# Patient Record
Sex: Female | Born: 1945 | Race: White | Hispanic: No | Smoking: Former smoker
Health system: Southern US, Community
[De-identification: ages and names within clinical notes are randomized; demographics above are authoritative.]

## PROBLEM LIST (undated history)

## (undated) DIAGNOSIS — Z9981 Dependence on supplemental oxygen: Secondary | ICD-10-CM

## (undated) DIAGNOSIS — S82092A Other fracture of left patella, initial encounter for closed fracture: Secondary | ICD-10-CM

## (undated) DIAGNOSIS — J189 Pneumonia, unspecified organism: Secondary | ICD-10-CM

## (undated) DIAGNOSIS — I2510 Atherosclerotic heart disease of native coronary artery without angina pectoris: Secondary | ICD-10-CM

## (undated) DIAGNOSIS — I272 Pulmonary hypertension, unspecified: Secondary | ICD-10-CM

## (undated) DIAGNOSIS — Z972 Presence of dental prosthetic device (complete) (partial): Secondary | ICD-10-CM

## (undated) DIAGNOSIS — K4090 Unilateral inguinal hernia, without obstruction or gangrene, not specified as recurrent: Secondary | ICD-10-CM

## (undated) DIAGNOSIS — S82091A Other fracture of right patella, initial encounter for closed fracture: Secondary | ICD-10-CM

## (undated) DIAGNOSIS — T148XXA Other injury of unspecified body region, initial encounter: Secondary | ICD-10-CM

## (undated) DIAGNOSIS — R233 Spontaneous ecchymoses: Secondary | ICD-10-CM

## (undated) DIAGNOSIS — R238 Other skin changes: Secondary | ICD-10-CM

## (undated) DIAGNOSIS — N390 Urinary tract infection, site not specified: Secondary | ICD-10-CM

## (undated) DIAGNOSIS — IMO0001 Reserved for inherently not codable concepts without codable children: Secondary | ICD-10-CM

## (undated) DIAGNOSIS — I10 Essential (primary) hypertension: Secondary | ICD-10-CM

## (undated) DIAGNOSIS — D759 Disease of blood and blood-forming organs, unspecified: Secondary | ICD-10-CM

## (undated) DIAGNOSIS — R234 Changes in skin texture: Secondary | ICD-10-CM

## (undated) DIAGNOSIS — J449 Chronic obstructive pulmonary disease, unspecified: Secondary | ICD-10-CM

## (undated) HISTORY — DX: Chronic obstructive pulmonary disease, unspecified: J44.9

## (undated) HISTORY — PX: MULTIPLE TOOTH EXTRACTIONS: SHX2053

## (undated) HISTORY — PX: COLONOSCOPY: SHX174

---

## 2003-06-08 ENCOUNTER — Emergency Department (HOSPITAL_COMMUNITY): Admission: EM | Admit: 2003-06-08 | Discharge: 2003-06-08 | Payer: Self-pay | Admitting: Emergency Medicine

## 2004-05-31 ENCOUNTER — Emergency Department (HOSPITAL_COMMUNITY): Admission: EM | Admit: 2004-05-31 | Discharge: 2004-05-31 | Payer: Self-pay | Admitting: Emergency Medicine

## 2011-02-21 ENCOUNTER — Ambulatory Visit: Payer: Self-pay | Admitting: Gastroenterology

## 2011-11-17 ENCOUNTER — Encounter: Payer: Self-pay | Admitting: Internal Medicine

## 2011-11-18 ENCOUNTER — Ambulatory Visit (INDEPENDENT_AMBULATORY_CARE_PROVIDER_SITE_OTHER): Payer: Medicare Other | Admitting: Internal Medicine

## 2011-11-18 ENCOUNTER — Encounter: Payer: Self-pay | Admitting: Internal Medicine

## 2011-11-18 ENCOUNTER — Ambulatory Visit (INDEPENDENT_AMBULATORY_CARE_PROVIDER_SITE_OTHER)
Admission: RE | Admit: 2011-11-18 | Discharge: 2011-11-18 | Disposition: A | Discharge status: Home / Self Care | Payer: Medicare Other | Source: Ambulatory Visit | Attending: Internal Medicine | Admitting: Internal Medicine

## 2011-11-18 VITALS — BP 102/68 | HR 109 | Temp 98.2°F | Ht 62.5 in | Wt 102.6 lb

## 2011-11-18 DIAGNOSIS — R0609 Other forms of dyspnea: Secondary | ICD-10-CM

## 2011-11-18 DIAGNOSIS — J4489 Other specified chronic obstructive pulmonary disease: Secondary | ICD-10-CM

## 2011-11-18 DIAGNOSIS — R0600 Dyspnea, unspecified: Secondary | ICD-10-CM

## 2011-11-18 DIAGNOSIS — R059 Cough, unspecified: Secondary | ICD-10-CM

## 2011-11-18 DIAGNOSIS — R05 Cough: Secondary | ICD-10-CM

## 2011-11-18 DIAGNOSIS — R0989 Other specified symptoms and signs involving the circulatory and respiratory systems: Secondary | ICD-10-CM

## 2011-11-18 DIAGNOSIS — F17200 Nicotine dependence, unspecified, uncomplicated: Secondary | ICD-10-CM

## 2011-11-18 DIAGNOSIS — J449 Chronic obstructive pulmonary disease, unspecified: Secondary | ICD-10-CM

## 2011-11-18 NOTE — Progress Notes (Signed)
Subjective:    Patient ID: [name], female    DOB: 14-Jul-1945, 66 y.o.   MRN: [id number]  HPI  IOV 11/18/2011  66 year old female Body mass index is 18.47 kg/(m^2).  reports that she has been smoking Cigarettes.  She has a 53 pack-year smoking history. She does not have any smokeless tobacco history on file. Woody Seller, MD, MD is PCP and Dr Wilma Flavin at [facility]. She is a Museum/gallery curator at [employer]   Main issue referred for dyspnea. Insidious onset few to several years ago. Progressive past few to several months. Currently dyspneic for exertion like climbing 1 flight of steps, pick up heavy objects but still able to do laundry or do basic gardening without significant difficulty. Dyspnea improved by rest. SEverity is rated as moderate to severe.  There is associatd wheeze but no chest pain.  She is on spiriva for 12 years and advair for several years but gives only short term relief; she reports good compliance.   There is also  Chronic cough for several years early in the morning and later in the day. There is some  Clear mucus with cough.  There is associated lisinopril intake for sevderal years. Rates cough as moderate with worst episode in mornings.   Smoker - STarted age 64. 33 pack per day. Quit end 2012 for first time with help of wellbutrin and patch.  But recently 66 year old granddaughter from congential liver disease ("cirrhosis") moved back with her and since then smoking. Patient is primary health care provider because her daughter and 98 year old do not get along.  Smokes 1 ppd (used to smoke 2 ppid)    CAT COPD Symptom and Quality of Life Score (glaxo smith kline trademark)  0 (no burden) to 5 (highest burden)  Never Cough -> Cough all the time 3  No phlegm in chest -> Chest is full of phlegm 3  No chest tightness -> Chest feels very tight 2  No dyspnea for 1 flight stairs/hill -> Very dyspneic for 1 flight of stairs 5  No limitations for ADL at home -> Very  limited with ADL at home 3  Confident leaving home -> Not at all confident leaving home 0  Sleep soundly -> Do not sleep soundly because of lung condition 3  Lots of Energy -> No energy at all 3  TOTAL Score (max 40)  22   Labs  - spirometry, cxr, walk test - all not done in several years or not done at all.      No results found.   Past Medical History  Diagnosis Date  . COPD (chronic obstructive pulmonary disease)   . Hypoxemia   . Collapsed lung      Family History  Problem Relation Age of Onset  . Heart disease Father   . Kidney failure Father      History   Social History  . Marital Status: Widowed    Spouse Name: N/A    Number of Children: N/A  . Years of Education: N/A   Occupational History  . weaver    Social History Main Topics  . Smoking status: Current Everyday Smoker -- 1.0 packs/day for 53 years    Types: Cigarettes  . Smokeless tobacco: Not on file  . Alcohol Use: Yes  . Drug Use: No  . Sexually Active: Not on file   Other Topics Concern  . Not on file   Social History Narrative  .  No narrative on file     No Known Allergies   Outpatient Prescriptions Prior to Visit  Medication Sig Dispense Refill  . amLODipine (NORVASC) 5 MG tablet Take 2.5 mg by mouth daily.       . Fluticasone-Salmeterol (ADVAIR) 250-50 MCG/DOSE AEPB Inhale 1 puff into the lungs every 12 (twelve) hours.      Marland Kitchen glucosamine-chondroitin 500-400 MG tablet Take 1 tablet by mouth 2 (two) times daily.      Marland Kitchen ipratropium-albuterol (DUONEB) 0.5-2.5 (3) MG/3ML SOLN Take 3 mLs by nebulization every 6 (six) hours as needed.      Marland Kitchen lisinopril (PRINIVIL,ZESTRIL) 5 MG tablet Take 5 mg by mouth daily.      Marland Kitchen tiotropium (SPIRIVA) 18 MCG inhalation capsule Place 18 mcg into inhaler and inhale daily.      Marland Kitchen triamterene-hydrochlorothiazide (DYAZIDE) 37.5-25 MG per capsule Take 0.5 capsules by mouth daily.       . vitamin C (ASCORBIC ACID) 500 MG tablet Take 500 mg by mouth daily.      .  vitamin E 400 UNIT capsule Take 400 Units by mouth daily.           Review of Systems  Constitutional: Negative for fever and unexpected weight change.  HENT: Negative for ear pain, nosebleeds, congestion, sore throat, rhinorrhea, sneezing, trouble swallowing, dental problem, postnasal drip and sinus pressure.   Eyes: Negative for redness and itching.  Respiratory: Positive for cough and shortness of breath. Negative for chest tightness and wheezing.   Cardiovascular: Negative for palpitations and leg swelling.  Gastrointestinal: Negative for nausea and vomiting.  Genitourinary: Negative for dysuria.  Musculoskeletal: Negative for joint swelling.  Skin: Negative for rash.  Neurological: Negative for headaches.  Hematological: Does not bruise/bleed easily.  Psychiatric/Behavioral: Negative for dysphoric mood. The patient is not nervous/anxious.        Objective:   Physical Exam  Vitals reviewed. Constitutional: She is oriented to person, place, and time. She appears well-developed and well-nourished. No distress.       Body mass index is 18.47 kg/(m^2).   HENT:  Head: Normocephalic and atraumatic.  Right Ear: External ear normal.  Left Ear: External ear normal.  Mouth/Throat: Oropharynx is clear and moist. No oropharyngeal exudate.  Eyes: Conjunctivae and EOM are normal. Pupils are equal, round, and reactive to light. Right eye exhibits no discharge. Left eye exhibits no discharge. No scleral icterus.  Neck: Normal range of motion. Neck supple. No JVD present. No tracheal deviation present. No thyromegaly present.  Cardiovascular: Normal rate, regular rhythm, normal heart sounds and intact distal pulses.  Exam reveals no gallop and no friction rub.   No murmur heard. Pulmonary/Chest: Effort normal and breath sounds normal. No respiratory distress. She has no wheezes. She has no rales. She exhibits no tenderness.  Abdominal: Soft. Bowel sounds are normal. She exhibits no  distension and no mass. There is no tenderness. There is no rebound and no guarding.  Musculoskeletal: Normal range of motion. She exhibits no edema and no tenderness.  Lymphadenopathy:    She has no cervical adenopathy.  Neurological: She is alert and oriented to person, place, and time. She has normal reflexes. No cranial nerve deficit. She exhibits normal muscle tone. Coordination normal.  Skin: Skin is warm and dry. No rash noted. She is not diaphoretic. No erythema. No pallor.  Psychiatric: She has a normal mood and affect. Her behavior is normal. Judgment and thought content normal.  Assessment & Plan:

## 2011-11-18 NOTE — Patient Instructions (Addendum)
#  COPD  Please have cxr Please have walk test for oxygen levels Please have full breathing test called PFT REturn to see me after above < 2-3 weeks or at your convenience AT followup will also do genetic blood test for copd, called Alpha 1  #Smoking  - think about ways you can quit smoking and write them in a piece of paper and bring them with you next time  #cough  - will discuss dc ace inhibitor at followup  #Followup REturn to see me after above < 2-3 weeks or at your convenience

## 2011-12-02 DIAGNOSIS — F17210 Nicotine dependence, cigarettes, uncomplicated: Secondary | ICD-10-CM | POA: Insufficient documentation

## 2011-12-02 DIAGNOSIS — J449 Chronic obstructive pulmonary disease, unspecified: Secondary | ICD-10-CM | POA: Insufficient documentation

## 2011-12-02 DIAGNOSIS — R05 Cough: Secondary | ICD-10-CM | POA: Insufficient documentation

## 2011-12-02 NOTE — Assessment & Plan Note (Signed)
The patient was counseled on the dangers of tobacco use, and was advised to quit.  Reviewed strategies to maximize success, including instructd as first step to write down ways she can quit smoking.   3 min face to face counseling

## 2011-12-02 NOTE — Assessment & Plan Note (Signed)
COPD  Please have cxr Please have walk test for oxygen levels Please have full breathing test called PFT REturn to see me after above < 2-3 weeks or at your convenience AT followup will also do genetic blood test for copd, called Alpha 1

## 2011-12-02 NOTE — Assessment & Plan Note (Signed)
#  cough  - will discuss dc ace inhibitor at followup  #Followup REturn to see me after above < 2-3 weeks or at your convenience

## 2011-12-10 ENCOUNTER — Encounter: Payer: Self-pay | Admitting: Internal Medicine

## 2011-12-10 ENCOUNTER — Ambulatory Visit (INDEPENDENT_AMBULATORY_CARE_PROVIDER_SITE_OTHER): Payer: 59 | Admitting: Internal Medicine

## 2011-12-10 VITALS — BP 124/72 | HR 86 | Temp 98.2°F | Ht 63.0 in | Wt 104.0 lb

## 2011-12-10 DIAGNOSIS — J449 Chronic obstructive pulmonary disease, unspecified: Secondary | ICD-10-CM

## 2011-12-10 DIAGNOSIS — R0600 Dyspnea, unspecified: Secondary | ICD-10-CM

## 2011-12-10 DIAGNOSIS — F17200 Nicotine dependence, unspecified, uncomplicated: Secondary | ICD-10-CM

## 2011-12-10 DIAGNOSIS — R05 Cough: Secondary | ICD-10-CM

## 2011-12-10 DIAGNOSIS — R0989 Other specified symptoms and signs involving the circulatory and respiratory systems: Secondary | ICD-10-CM

## 2011-12-10 LAB — PULMONARY FUNCTION TEST

## 2011-12-10 NOTE — Patient Instructions (Addendum)
#  COPD - you have severe copd  - Please have walk test for oxygen levels today  -  I have also set you for oxygen level test at night when you sleep - Continue spiriva and advair scheduled without fail - I have referred you to pulmonary rehab - AT followup will also do genetic blood test for copd, called Alpha 1  #Smoking  -absolutely need to quit. For now, agree you will do this on your own  #cough  -stop lisinopril  - take other bp meds as before  - will check bp at next visit  #Followup REturn to see me in 3 months CAT score at followup Alpha 1 genetic test at followup

## 2011-12-10 NOTE — Progress Notes (Signed)
PFT done today.

## 2011-12-10 NOTE — Progress Notes (Signed)
Subjective:    Patient ID: [name], female    DOB: Jun 05, 1946, 66 y.o.   MRN: [id number]  HPI IOV 11/18/2011  66 year old female Body mass index is 18.47 kg/(m^2).  reports that she has been smoking Cigarettes.  She has a 53 pack-year smoking history. She does not have any smokeless tobacco history on file. Woody Seller, MD, MD is PCP and Dr Wilma Flavin at [facility]. She is a Museum/gallery curator at [employer]   Main issue referred for dyspnea. Insidious onset few to several years ago. Progressive past few to several months. Currently dyspneic for exertion like climbing 1 flight of steps, pick up heavy objects but still able to do laundry or do basic gardening without significant difficulty. Dyspnea improved by rest. SEverity is rated as moderate to severe.  There is associatd wheeze but no chest pain.  She is on spiriva for 12 years and advair for several years but gives only short term relief; she reports good compliance.   There is also  Chronic cough for several years early in the morning and later in the day. There is some  Clear mucus with cough.  There is associated lisinopril intake for sevderal years. Rates cough as moderate with worst episode in mornings.   Smoker - STarted age 28. 69 pack per day. Quit end 2012 for first time with help of wellbutrin and patch.  But recently 34 year old granddaughter from congential liver disease ("cirrhosis") moved back with her and since then smoking. Patient is primary health care provider because her daughter and 52 year old do not get along.  Smokes 1 ppd (used to smoke 2 ppid)    CAT COPD Symptom and Quality of Life Score (glaxo smith kline trademark)  0 (no burden) to 5 (highest burden)  Never Cough -> Cough all the time 3  No phlegm in chest -> Chest is full of phlegm 3  No chest tightness -> Chest feels very tight 2  No dyspnea for 1 flight stairs/hill -> Very dyspneic for 1 flight of stairs 5  No limitations for ADL at home -> Very  limited with ADL at home 3  Confident leaving home -> Not at all confident leaving home 0  Sleep soundly -> Do not sleep soundly because of lung condition 3  Lots of Energy -> No energy at all 3  TOTAL Score (max 40)  22   Labs  - spirometry, cxr, walk test - all not done in several years or not done at all.   No results found. #COPD  Please have cxr  Please have walk test for oxygen levels  Please have full breathing test called PFT  REturn to see me after above < 2-3 weeks or at your convenience  AT followup will also do genetic blood test for copd, called Alpha 1  #Smoking  - think about ways you can quit smoking and write them in a piece of paper and bring them with you next time  #cough  - will discuss dc ace inhibitor at followup  #Followup  REturn to see me after above < 2-3 weeks or at your convenience   OV 12/10/2011  followup to discuss results. 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 No acute cardiopulmonary disease.  PFT 12/10/2011  - Gold stage 3 copd - fev1 0.8L/42%, 20% BD respinse. TLC 133%, DLCO 46%   Walk test today   - pulse ox  88% at first lap 185 feet but increased to 91% at 3rd lap end  Current outpatient prescriptions:amLODipine (NORVASC) 5 MG tablet, Take 2.5 mg by mouth daily. , Disp: , Rfl: ;  Coconut Oil 1000 MG CAPS, Take 2 capsules by mouth daily., Disp: , Rfl: ;  Fluticasone-Salmeterol (ADVAIR) 250-50 MCG/DOSE AEPB, Inhale 1 puff into the lungs every 12 (twelve) hours., Disp: , Rfl: ;  Garlic 10 MG CAPS, Take 1 tablet by mouth daily., Disp: , Rfl:  glucosamine-chondroitin 500-400 MG tablet, Take 1 tablet by mouth 2 (two) times daily., Disp: , Rfl: ;  Ipratropium-Albuterol (COMBIVENT RESPIMAT) 20-100 MCG/ACT AERS respimat, Inhale 1 puff into the lungs every 6 (six) hours as needed., Disp: , Rfl: ;  ipratropium-albuterol (DUONEB) 0.5-2.5 (3) MG/3ML SOLN, Take 3 mLs by nebulization  every 6 (six) hours as needed., Disp: , Rfl:  lisinopril (PRINIVIL,ZESTRIL) 5 MG tablet, Take 5 mg by mouth daily., Disp: , Rfl: ;  tiotropium (SPIRIVA) 18 MCG inhalation capsule, Place 18 mcg into inhaler and inhale daily., Disp: , Rfl: ;  triamterene-hydrochlorothiazide (DYAZIDE) 37.5-25 MG per capsule, Take 0.5 capsules by mouth daily. , Disp: , Rfl: ;  vitamin C (ASCORBIC ACID) 500 MG tablet, Take 500 mg by mouth daily., Disp: , Rfl:  vitamin E 400 UNIT capsule, Take 400 Units by mouth daily., Disp: , Rfl:    Review of Systems  Constitutional: Negative for fever and unexpected weight change.  HENT: Negative for ear pain, nosebleeds, congestion, sore throat, rhinorrhea, sneezing, trouble swallowing, dental problem, postnasal drip and sinus pressure.   Eyes: Negative for redness and itching.  Respiratory: Negative for cough, chest tightness, shortness of breath and wheezing.   Cardiovascular: Negative for palpitations and leg swelling.  Gastrointestinal: Negative for nausea and vomiting.  Genitourinary: Negative for dysuria.  Musculoskeletal: Negative for joint swelling.  Skin: Negative for rash.  Neurological: Negative for headaches.  Hematological: Does not bruise/bleed easily.  Psychiatric/Behavioral: Negative for dysphoric mood. The patient is not nervous/anxious.        Objective:   Physical Exam Vitals reviewed. Constitutional: She is oriented to person, place, and time. She appears well-developed and well-nourished. No distress.       Body mass index is 18.47 kg/(m^2).   HENT:  Head: Normocephalic and atraumatic.  Right Ear: External ear normal.  Left Ear: External ear normal.  Mouth/Throat: Oropharynx is clear and moist. No oropharyngeal exudate.  Eyes: Conjunctivae and EOM are normal. Pupils are equal, round, and reactive to light. Right eye exhibits no discharge. Left eye exhibits no discharge. No scleral icterus.  Neck: Normal range of motion. Neck supple. No JVD  present. No tracheal deviation present. No thyromegaly present.  Cardiovascular: Normal rate, regular rhythm, normal heart sounds and intact distal pulses.  Exam reveals no gallop and no friction rub.   No murmur heard. Pulmonary/Chest: Effort normal and breath sounds normal. No respiratory distress. She has no wheezes. She has no rales. She exhibits no tenderness.  Abdominal: Soft. Bowel sounds are normal. She exhibits no distension and no mass. There is no tenderness. There is no rebound and no guarding.  Musculoskeletal: Normal range of motion. She exhibits no edema and no tenderness.  Lymphadenopathy:    She has no cervical adenopathy.  Neurological: She is alert and oriented to person, place, and time. She has normal reflexes. No cranial nerve deficit. She exhibits normal muscle tone. Coordination normal.  Skin: Skin is warm and dry. No rash noted. She is not diaphoretic. No  erythema. No pallor.  Psychiatric: She has a normal mood and affect. Her behavior is normal. Judgment and thought content normal.         Assessment & Plan:

## 2011-12-11 ENCOUNTER — Telehealth: Payer: Self-pay | Admitting: Internal Medicine

## 2011-12-11 NOTE — Telephone Encounter (Signed)
Per 12-10-11 ov note: Patient Instructions     #COPD  - you have severe copd  - Please have walk test for oxygen levels today  - I have also set you for oxygen level test at night when you sleep  - Continue spiriva and advair scheduled without fail  - I have referred you to pulmonary rehab  - AT followup will also do genetic blood test for copd, called Alpha 1  #Smoking  -absolutely need to quit. For now, agree you will do this on your own  #cough  -stop lisinopril  - take other bp meds as before  - will check bp at next visit  #Followup  REturn to see me in 3 months  CAT score at followup    Called spoke with patient who stated that she still works a full-time job and will be unable to attend pulmonary rehab unless she can go after 4pm.  Pt is open to other options.  Also, pt stated that while the combivent respimat works well for her, it is expensive.  Offered patient assistance though I advise pt that she may not be approved d/t her type of insurance.  Pt would like to see anyway.  Patient assistance form placed in MR's lookat to sign.  Please call pt once this is ready.  A coupon card has been attached to the form.  Will route message to MR.

## 2011-12-14 NOTE — Assessment & Plan Note (Signed)
#  cough  -stop lisinopril  - take other bp meds as before  - will check bp at next visit  #Followup REturn to see me in 3 months CAT score at followup Alpha 1 genetic test at followup  > 50% of this 15 min visit spent in face to face counseling

## 2011-12-14 NOTE — Assessment & Plan Note (Signed)
#  Smoking  -absolutely need to quit. For now, agree you will do this on your own   3 min counseling

## 2011-12-14 NOTE — Assessment & Plan Note (Signed)
#  COPD - you have severe copd  - Walk test does not show drop in oxygen levels  -  I have also set you for oxygen level test at night when you sleep - Continue spiriva and advair scheduled without fail - I have referred you to pulmonary rehab - AT followup will also do genetic blood test for copd, called Alpha 1   > 50% of this 15 min visit spent in face to face counseling

## 2011-12-16 MED ORDER — IPRATROPIUM-ALBUTEROL 20-100 MCG/ACT IN AERS: 1.0000 | INHALATION_SPRAY | Freq: Four times a day (QID) | RESPIRATORY_TRACT | Status: DC | PRN

## 2011-12-16 NOTE — Telephone Encounter (Signed)
Patient assistance forms at front. Pt is aware to complete pt portion and then bring forms back to the office to fax. [place] Bing, CMA

## 2012-01-20 ENCOUNTER — Telehealth: Payer: Self-pay | Admitting: Internal Medicine

## 2012-01-20 DIAGNOSIS — J449 Chronic obstructive pulmonary disease, unspecified: Secondary | ICD-10-CM

## 2012-01-20 NOTE — Telephone Encounter (Signed)
ono showed 29 min total on 12/31/11 with oulse ox < 88%. This was 17% of her sleep. She qualifies for 1L o2 at night during sleep. Pleas set this up

## 2012-01-22 ENCOUNTER — Encounter: Payer: Self-pay | Admitting: Internal Medicine

## 2012-01-22 NOTE — Telephone Encounter (Signed)
LMTCBx1. [place] Bing, CMA

## 2012-01-26 NOTE — Telephone Encounter (Signed)
LMTCBx1 on cell and home number.[place] Bing, CMA

## 2012-01-26 NOTE — Telephone Encounter (Signed)
Pt aware and order placed.[place] Bing, CMA

## 2012-01-26 NOTE — Telephone Encounter (Signed)
Pt returned call. "please let this ring a bit as it is a work # and takes a few moments to get answered". [name]

## 2012-03-04 ENCOUNTER — Telehealth: Payer: Self-pay | Admitting: Internal Medicine

## 2012-03-04 NOTE — Telephone Encounter (Signed)
i spoke with pt and she stated since MR took off the lisinopril her BP has been running high. 159/92, 156/94, 169/98. She takes dyazide 37.5-25 mg 1 tab daily and amlodipine 5 mg 1/2 tab daily. She is requesting further recs from MR since he took her off her lisinopril. She stated she is going out and to call her back tomorrow with recs from MR. Please advise Dr. Chase Caller, thanks

## 2012-03-05 MED ORDER — AMLODIPINE BESYLATE 10 MG PO TABS: 10.0000 mg | ORAL_TABLET | Freq: Every day | ORAL | Status: DC

## 2012-03-05 MED ORDER — TRIAMTERENE-HCTZ 37.5-25 MG PO CAPS: 1.0000 | ORAL_CAPSULE | Freq: Every day | ORAL | Status: DC

## 2012-03-05 NOTE — Telephone Encounter (Signed)
LMTCB

## 2012-03-05 NOTE — Telephone Encounter (Signed)
Pt returned our call [name]

## 2012-03-05 NOTE — Telephone Encounter (Signed)
Called and spoke with pt and she is aware to increase the amlodipine to 10 mg daily and refills of this med and the dyazide has been sent to the pharmacy.  Pt is aware and nothing further is needed.

## 2012-03-05 NOTE — Telephone Encounter (Signed)
lmomtcb to discuss with pt.

## 2012-03-05 NOTE — Telephone Encounter (Signed)
Pt stated that since Monday when her BP was elevated she increased the amlodipine  5 mg  1 tablet daily along with the dyazide 1 tab daily.  She stated that her BP this morning was 149/87 but is normally higher in the evening.  Please advise .  thanks

## 2012-03-05 NOTE — Telephone Encounter (Signed)
Increase amlodipine to 46m per day

## 2012-03-05 NOTE — Telephone Encounter (Signed)
Increase amlodipine to 51m , 1 full tablet daily and call in 1-2 weeks with bp readings

## 2012-03-17 ENCOUNTER — Telehealth: Payer: Self-pay | Admitting: Internal Medicine

## 2012-03-17 NOTE — Telephone Encounter (Signed)
I spoke with pt and is aware of MR recs. She will continue to monitor her bp and will cal if need be.

## 2012-03-17 NOTE — Telephone Encounter (Signed)
I spoke with pt and she stated her B[ is still running high. Yesterday it was 172/92 and today was 143/86. She is doing everything MR recommended. SHE is requesting further recs. Please advise. Thanks.

## 2012-03-17 NOTE — Telephone Encounter (Signed)
Pt returned call. [name]

## 2012-03-17 NOTE — Telephone Encounter (Signed)
No change. Continue current schedule. Continue to monitor 2-3 times a week. IF any reading over 157 systolic then call. Otherwise, call with ranges at end of 3 weeks or discuss at followup visit which will be more helpful for titration. Need some time for current changes to kick in  I called her to tell her this but went to voicemail which is not set up

## 2012-03-17 NOTE — Telephone Encounter (Signed)
lmtcb

## 2012-03-17 NOTE — Telephone Encounter (Signed)
lmomtcb x1 for pt 

## 2012-03-17 NOTE — Telephone Encounter (Signed)
Pt returned call. Please call her back after 4:30 today. Mariann Laster

## 2012-03-24 ENCOUNTER — Ambulatory Visit (INDEPENDENT_AMBULATORY_CARE_PROVIDER_SITE_OTHER): Payer: 59 | Admitting: Internal Medicine

## 2012-03-24 ENCOUNTER — Other Ambulatory Visit: Payer: 59

## 2012-03-24 VITALS — BP 130/88 | HR 96 | Temp 98.3°F | Ht 62.0 in | Wt 103.0 lb

## 2012-03-24 DIAGNOSIS — F17200 Nicotine dependence, unspecified, uncomplicated: Secondary | ICD-10-CM

## 2012-03-24 DIAGNOSIS — J449 Chronic obstructive pulmonary disease, unspecified: Secondary | ICD-10-CM

## 2012-03-24 DIAGNOSIS — I10 Essential (primary) hypertension: Secondary | ICD-10-CM

## 2012-03-24 MED ORDER — LOSARTAN POTASSIUM 50 MG PO TABS: 50.0000 mg | ORAL_TABLET | Freq: Every day | ORAL | Status: DC

## 2012-03-24 NOTE — Progress Notes (Signed)
Subjective:    Patient ID: [name], female    DOB: [birth year], 66 y.o.   MRN: [id number]  HPI  IOV 11/18/2011  66 year old female Body mass index is 18.47 kg/(m^2).  reports that she has been smoking Cigarettes.  She has a 53 pack-year smoking history. She does not have any smokeless tobacco history on file. Woody Seller, MD, MD is PCP and Dr Wilma Flavin at [facility]. She is a Museum/gallery curator at [employer]   Main issue referred for dyspnea. Insidious onset few to several years ago. Progressive past few to several months. Currently dyspneic for exertion like climbing 1 flight of steps, pick up heavy objects but still able to do laundry or do basic gardening without significant difficulty. Dyspnea improved by rest. SEverity is rated as moderate to severe.  There is associatd wheeze but no chest pain.  She is on spiriva for 12 years and advair for several years but gives only short term relief; she reports good compliance.   There is also  Chronic cough for several years early in the morning and later in the day. There is some  Clear mucus with cough.  There is associated lisinopril intake for sevderal years. Rates cough as moderate with worst episode in mornings.   Smoker - STarted age 81. 73 pack per day. Quit end 2012 for first time with help of wellbutrin and patch.  But recently 110 year old granddaughter from congential liver disease ("cirrhosis") moved back with her and since then smoking. Patient is primary health care provider because her daughter and 75 year old do not get along.  Smokes 1 ppd (used to smoke 2 ppid)     Labs  - spirometry, cxr, walk test - all not done in several years or not done at all.   No results found. #COPD  Please have cxr  Please have walk test for oxygen levels  Please have full breathing test called PFT  REturn to see me after above < 2-3 weeks or at your convenience  AT followup will also do genetic blood test for copd, called Alpha 1  #Smoking    - think about ways you can quit smoking and write them in a piece of paper and bring them with you next time  #cough  - will discuss dc ace inhibitor at followup  #Followup  REturn to see me after above < 2-3 weeks or at your convenience   OV 12/10/2011  followup to discuss results. 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 No acute cardiopulmonary disease.  PFT 12/10/2011  - Gold stage 3 copd - fev1 0.8L/42%, 20% BD respinse. TLC 133%, DLCO 46%   Walk test today   - pulse ox 88% at first lap 185 feet but increased to 91% at 3rd lap end  REC #COPD - you have severe copd  - Please have walk test for oxygen levels today  -  I have also set you for oxygen level test at night when you sleep - Continue spiriva and advair scheduled without fail - I have referred you to pulmonary rehab - AT followup will also do genetic blood test for copd, called Alpha 1  #Smoking  -absolutely need to quit. For now, agree you will do this on your own  #cough  -stop lisinopril  - take other bp meds as before  - will check bp at next visit  #Followup REturn to see me  in 3 months CAT score at followup Alpha 1 genetic test at followup   OV 03/24/2012 FU COPD, smoking, bp  COPD - same. COPD CAT score unchanged at 22  Smoking - refusing medication help. Understands need to quit. Says will try own    BP - have new Hoarseness -says is due to  Norvasc.    Of note, has new left infra-axillary and infrascapullar chest soreness but denies cold, sore throat, hoarseness, edema, paroxysmal nocturanl dyspnea, orthopnea, edema, hemptysis, travel, hormone use   CAT COPD Symptom and Quality of Life Score (glaxo smith kline trademark)  11/18/11 03/24/2012   Never Cough -> Cough all the time 3 3  No phlegm in chest -> Chest is full of phlegm 3 3  No chest tightness -> Chest feels very tight 2 2  No dyspnea for 1 flight stairs/hill ->  Very dyspneic for 1 flight of stairs 5 5  No limitations for ADL at home -> Very limited with ADL at home 3 2  Confident leaving home -> Not at all confident leaving home 0 1  Sleep soundly -> Do not sleep soundly because of lung condition 3 3  Lots of Energy -> No energy at all 3 3  TOTAL Score (max 40)  22 22   Review of Systems  Constitutional: Negative for fever and unexpected weight change.  HENT: Negative for ear pain, nosebleeds, congestion, sore throat, rhinorrhea, sneezing, trouble swallowing, dental problem, postnasal drip and sinus pressure.   Eyes: Negative for redness and itching.  Respiratory: Positive for cough, shortness of breath and wheezing. Negative for chest tightness.   Cardiovascular: Negative for palpitations and leg swelling.  Gastrointestinal: Negative for nausea and vomiting.  Genitourinary: Negative for dysuria.  Musculoskeletal: Negative for joint swelling.  Skin: Negative for rash.  Neurological: Negative for headaches.  Hematological: Bruises/bleeds easily.  Psychiatric/Behavioral: Negative for dysphoric mood. The patient is not nervous/anxious.    Current outpatient prescriptions:amLODipine (NORVASC) 10 MG tablet, Take 1 tablet (10 mg total) by mouth daily., Disp: 30 tablet, Rfl: 1;  Coconut Oil 1000 MG CAPS, Take 2 capsules by mouth daily., Disp: , Rfl: ;  Fluticasone-Salmeterol (ADVAIR) 250-50 MCG/DOSE AEPB, Inhale 1 puff into the lungs every 12 (twelve) hours., Disp: , Rfl: ;  Garlic 10 MG CAPS, Take 1 tablet by mouth daily., Disp: , Rfl:  glucosamine-chondroitin 500-400 MG tablet, Take 1 tablet by mouth 2 (two) times daily., Disp: , Rfl: ;  Ipratropium-Albuterol (COMBIVENT RESPIMAT) 20-100 MCG/ACT AERS respimat, Inhale 1 puff into the lungs every 6 (six) hours as needed., Disp: 3 Inhaler, Rfl: 3;  ipratropium-albuterol (DUONEB) 0.5-2.5 (3) MG/3ML SOLN, Take 3 mLs by nebulization every 6 (six) hours as needed., Disp: , Rfl:  lisinopril (PRINIVIL,ZESTRIL) 5 MG  tablet, Take 5 mg by mouth daily., Disp: , Rfl: ;  tiotropium (SPIRIVA) 18 MCG inhalation capsule, Place 18 mcg into inhaler and inhale daily., Disp: , Rfl: ;  triamterene-hydrochlorothiazide (DYAZIDE) 37.5-25 MG per capsule, Take 1 each (1 capsule total) by mouth daily., Disp: 30 capsule, Rfl: 1;  vitamin C (ASCORBIC ACID) 500 MG tablet, Take 500 mg by mouth daily., Disp: , Rfl:  vitamin E 400 UNIT capsule, Take 400 Units by mouth daily., Disp: , Rfl:      Objective:   Physical Exam  Filed Vitals:   03/24/12 1644  Height: _0  (1.575 m)  Weight: 103 lb (46.72 kg)   Filed Vitals:   03/24/12 1644  BP: 130/88  Pulse: 96  Temp: 98.3 F (36.8 C)  TempSrc: Oral  Height: _0  (1.575 m)  Weight: 103 lb (46.72 kg)  SpO2: 93%    Vitals reviewed. Constitutional: She is oriented to person, place, and time. She appears well-developed and well-nourished. No distress.       Body mass index is 18.47 kg/(m^2).   HENT:  Head: Normocephalic and atraumatic.  Right Ear: External ear normal.  Left Ear: External ear normal.  Mouth/Throat: Oropharynx is clear and moist. No oropharyngeal exudate.  Eyes: Conjunctivae and EOM are normal. Pupils are equal, round, and reactive to light. Right eye exhibits no discharge. Left eye exhibits no discharge. No scleral icterus.  Neck: Normal range of motion. Neck supple. No JVD present. No tracheal deviation present. No thyromegaly present.  Cardiovascular: Normal rate, regular rhythm, normal heart sounds and intact distal pulses.  Exam reveals no gallop and no friction rub.   No murmur heard. Pulmonary/Chest: Effort normal and breath sounds normal. No respiratory distress. She has no wheezes. She has no rales. She exhibits no tenderness.  Abdominal: Soft. Bowel sounds are normal. She exhibits no distension and no mass. There is no tenderness. There is no rebound and no guarding.  Musculoskeletal: Normal range of motion. She exhibits no edema and no tenderness.   Lymphadenopathy:    She has no cervical adenopathy.  Neurological: She is alert and oriented to person, place, and time. She has normal reflexes. No cranial nerve deficit. She exhibits normal muscle tone. Coordination normal.  Skin: Skin is warm and dry. No rash noted. She is not diaphoretic. No erythema. No pallor.  Psychiatric: She has a normal mood and affect. Her behavior is normal. Judgment and thought content normal.         Assessment & Plan:

## 2012-03-24 NOTE — Patient Instructions (Addendum)
#  COPD - glad you had flu shot - you have severe copd that is stable  -- Continue spiriva and advair scheduled without fail; CMA will ensure refill - Do blood test for Alpha 1 genetic cause of copd - Did rehab call you ?, If not will check with them  #Smoking  -absolutely need to quit. For now, agree you will do this on your own  #BP  - because norvasc giving you dryness stop it; though I am not fully sure that is cause of dryness  - try losartan 26m daily  -- take other bp meds as before  - will check bp at next visit  #Followup REturn to see me in 3 months Call with bp recordings at home esp if > 1633or 1354systolic CAT score at followup

## 2012-03-28 ENCOUNTER — Encounter: Payer: Self-pay | Admitting: Internal Medicine

## 2012-03-28 DIAGNOSIS — I10 Essential (primary) hypertension: Secondary | ICD-10-CM | POA: Insufficient documentation

## 2012-03-28 NOTE — Assessment & Plan Note (Signed)
#  BP  - because norvasc giving you dryness stop it; though I am not fully sure that is cause of dryness  - try losartan 40m daily  -- take other bp meds as before  - will check bp at next visit

## 2012-03-28 NOTE — Assessment & Plan Note (Signed)
#  COPD - glad you had flu shot - you have severe copd that is stable  -- Continue spiriva and advair scheduled without fail; CMA will ensure refill - Do blood test for Alpha 1 genetic cause of copd - Did rehab call you ?, If not will check with them #Followup REturn to see me in 3 months Call with bp recordings at home esp if > 762 or 263 systolic CAT score at followup

## 2012-03-28 NOTE — Assessment & Plan Note (Signed)
#  Smoking  -absolutely need to quit. For now, agree you will do this on your own  3 min face to face counseling to reach this above decision

## 2012-04-09 ENCOUNTER — Ambulatory Visit: Payer: 59 | Admitting: Internal Medicine

## 2012-05-08 ENCOUNTER — Other Ambulatory Visit: Payer: Self-pay | Admitting: Internal Medicine

## 2012-05-12 ENCOUNTER — Ambulatory Visit (INDEPENDENT_AMBULATORY_CARE_PROVIDER_SITE_OTHER): Payer: 59 | Admitting: Internal Medicine

## 2012-05-12 ENCOUNTER — Encounter: Payer: Self-pay | Admitting: Internal Medicine

## 2012-05-12 ENCOUNTER — Telehealth: Payer: Self-pay | Admitting: Internal Medicine

## 2012-05-12 VITALS — BP 102/70 | HR 129 | Temp 99.4°F | Ht 62.0 in | Wt 99.2 lb

## 2012-05-12 DIAGNOSIS — Z129 Encounter for screening for malignant neoplasm, site unspecified: Secondary | ICD-10-CM

## 2012-05-12 DIAGNOSIS — J441 Chronic obstructive pulmonary disease with (acute) exacerbation: Secondary | ICD-10-CM

## 2012-05-12 MED ORDER — LEVALBUTEROL HCL 0.63 MG/3ML IN NEBU
0.6300 mg | INHALATION_SOLUTION | Freq: Once | RESPIRATORY_TRACT | Status: AC
  Administered 2012-05-12: 0.63 mg via RESPIRATORY_TRACT

## 2012-05-12 MED ORDER — LEVOFLOXACIN 500 MG PO TABS: 500.0000 mg | ORAL_TABLET | Freq: Every day | ORAL | Status: DC

## 2012-05-12 MED ORDER — PREDNISONE 10 MG PO TABS: ORAL_TABLET | ORAL | Status: DC

## 2012-05-12 MED ORDER — METHYLPREDNISOLONE ACETATE 80 MG/ML IJ SUSP
80.0000 mg | Freq: Once | INTRAMUSCULAR | Status: AC
  Administered 2012-05-12: 80 mg via INTRAMUSCULAR

## 2012-05-12 NOTE — Addendum Note (Signed)
Addended by: Lilli Few on: 05/12/2012 03:46 PM   Modules accepted: Orders

## 2012-05-12 NOTE — Telephone Encounter (Signed)
I spoke with the pt and she is c/o increased SOB with activity, wheezing, productive cough with green phelgm, sinus congestion, all x 4 days. Pt scheduled to come in today at 1:30. [place] Bing, CMA

## 2012-05-12 NOTE — Assessment & Plan Note (Signed)
#  COPD flare You have mild attack of copd called COPD exacerbation Get a breathing treatment and steroid shot IM depot medrol 71m in office right now Please take levaquin 507monce daily  X 6 days Please take prednisone 4068mnce daily x 3 days, then 84m55mce daily x 3 days, then 10mg92me daily x 3 days, then 5mg o13m dailyx 3 days and stop Return to see me after 3 months

## 2012-05-12 NOTE — Progress Notes (Signed)
Subjective:    Patient ID: [name], female    DOB: [birth year], 66 y.o.   MRN: [id number]  HPI IOV 11/18/2011  66 year old female Body mass index is 18.47 kg/(m^2).  reports that she has been smoking Cigarettes.  She has a 53 pack-year smoking history. She does not have any smokeless tobacco history on file. Woody Seller, MD, MD is PCP and Dr Wilma Flavin at [facility]. She is a Museum/gallery curator at [employer]   Main issue referred for dyspnea. Insidious onset few to several years ago. Progressive past few to several months. Currently dyspneic for exertion like climbing 1 flight of steps, pick up heavy objects but still able to do laundry or do basic gardening without significant difficulty. Dyspnea improved by rest. SEverity is rated as moderate to severe.  There is associatd wheeze but no chest pain.  She is on spiriva for 12 years and advair for several years but gives only short term relief; she reports good compliance.   There is also  Chronic cough for several years early in the morning and later in the day. There is some  Clear mucus with cough.  There is associated lisinopril intake for sevderal years. Rates cough as moderate with worst episode in mornings.   Smoker - STarted age 86. 79 pack per day. Quit end 2012 for first time with help of wellbutrin and patch.  But recently 64 year old granddaughter from congential liver disease ("cirrhosis") moved back with her and since then smoking. Patient is primary health care provider because her daughter and 30 year old do not get along.  Smokes 1 ppd (used to smoke 2 ppid)     Labs  - spirometry, cxr, walk test - all not done in several years or not done at all.   No results found. #COPD  Please have cxr  Please have walk test for oxygen levels  Please have full breathing test called PFT  REturn to see me after above < 2-3 weeks or at your convenience  AT followup will also do genetic blood test for copd, called Alpha 1  #Smoking    - think about ways you can quit smoking and write them in a piece of paper and bring them with you next time  #cough  - will discuss dc ace inhibitor at followup  #Followup  REturn to see me after above < 2-3 weeks or at your convenience   OV 12/10/2011  followup to discuss results. 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 No acute cardiopulmonary disease.  PFT 12/10/2011  - Gold stage 3 copd - fev1 0.8L/42%, 20% BD respinse. TLC 133%, DLCO 46%   Walk test today   - pulse ox 88% at first lap 185 feet but increased to 91% at 3rd lap end  REC #COPD - you have severe copd  - Please have walk test for oxygen levels today  -  I have also set you for oxygen level test at night when you sleep - Continue spiriva and advair scheduled without fail - I have referred you to pulmonary rehab - AT followup will also do genetic blood test for copd, called Alpha 1  #Smoking  -absolutely need to quit. For now, agree you will do this on your own  #cough  -stop lisinopril  - take other bp meds as before  - will check bp at next visit  #Followup REturn to see me in  3 months CAT score at followup Alpha 1 genetic test at followup   OV 03/24/2012 FU COPD, smoking, bp  COPD - same. COPD CAT score unchanged at 22  Smoking - refusing medication help. Understands need to quit. Says will try own    BP - have new Hoarseness -says is due to  Norvasc.    Of note, has new left infra-axillary and infrascapullar chest soreness but denies cold, sore throat, hoarseness, edema, paroxysmal nocturanl dyspnea, orthopnea, edema, hemptysis, travel, hormone use   #COPD  - glad you had flu shot  - you have severe copd that is stable  -- Continue spiriva and advair scheduled without fail; CMA will ensure refill  - Do blood test for Alpha 1 genetic cause of copd  - Did rehab call you ?, If not will check with them  #Smoking   -absolutely need to quit. For now, agree you will do this on your own  #BP  - because norvasc giving you dryness stop it; though I am not fully sure that is cause of dryness  - try losartan 78m daily  -- take other bp meds as before  - will check bp at next visit  #Followup  REturn to see me in 3 months  Call with bp recordings at home esp if > 1524or 1818systolic  CAT score at followup     OV 05/12/2012 - ACUTE VISIT   Acute visit. Since past 4 days, sudden increase in cough, dyspnea, wheeze, sputum volume and change in sputum color to yellow green. Feels she has sinus infection as well. CAT score much worse at 31 (baseline 22). She feels more fatigued and needed to take nebs to come to office. She has had flu shot. She does not feel sick enough to need admission however. Denies chest pain, edema, hemoptysis, fever.      CAT COPD Symptom and Quality of Life Score (glaxo smith kline trademark)  11/18/11 03/24/2012  05/12/2012   Never Cough -> Cough all the time _0 No phlegm in chest -> Chest is full of phlegm _1 No chest tightness -> Chest feels very tight _2 No dyspnea for 1 flight stairs/hill -> Very dyspneic for 1 flight of stairs _3 No limitations for ADL at home -> Very limited with ADL at home _4 Confident leaving home -> Not at all confident leaving home 0 1 2  Sleep soundly -> Do not sleep soundly because of lung condition _5 Lots of Energy -> No energy at all _6 TOTAL Score (max 40)  _7 Review of Systems  Constitutional: Negative for fever and unexpected weight change.  HENT: Positive for congestion. Negative for ear pain, nosebleeds, sore throat, rhinorrhea, sneezing, trouble swallowing, dental problem, postnasal drip and sinus pressure.   Eyes: Negative for redness and itching.  Respiratory: Positive for cough, chest tightness, shortness of breath and wheezing.   Cardiovascular: Negative for palpitations and leg swelling.   Gastrointestinal: Negative for nausea and vomiting.  Genitourinary: Negative for dysuria.  Musculoskeletal: Negative for joint swelling.  Skin: Negative for rash.  Neurological: Negative for headaches.  Hematological: Does not bruise/bleed easily.  Psychiatric/Behavioral: Negative for dysphoric mood. The patient is not nervous/anxious.        Objective:   Physical Exam Filed Vitals:   05/12/12 1339  BP: 102/70  Pulse: 129  Temp: 99.4 F (37.4 C)  TempSrc: Oral  Height: _0  (1.575 m)  Weight: 99 lb 3.2 oz (44.997 kg)  SpO2: 94%      Vitals reviewed. Constitutional: She is oriented to person, place, and time. She appears well-developed and well-nourished. No distress.       Body mass index is 18.14 kg/(m^2).    HENT:  Head: Normocephalic and atraumatic.  Right Ear: External ear normal.  Left Ear: External ear normal.  Mouth/Throat: Oropharynx is clear and moist. No oropharyngeal exudate.  Eyes: Conjunctivae and EOM are normal. Pupils are equal, round, and reactive to light. Right eye exhibits no discharge. Left eye exhibits no discharge. No scleral icterus.  Neck: Normal range of motion. Neck supple. No JVD present. No tracheal deviation present. No thyromegaly present.  Cardiovascular: Normal rate, regular rhythm, normal heart sounds and intact distal pulses.  Exam reveals no gallop and no friction rub.   No murmur heard. Pulmonary/Chest: Effort normal and breath sounds normal. No respiratory distress. She has no wheezes. She has no rales. She exhibits no tenderness.  Abdominal: Soft. Bowel sounds are normal. She exhibits no distension and no mass. There is no tenderness. There is no rebound and no guarding.  Musculoskeletal: Normal range of motion. She exhibits no edema and no tenderness.  Lymphadenopathy:    She has no cervical adenopathy.  Neurological: She is alert and oriented to person, place, and time. She has normal reflexes. No cranial nerve deficit. She  exhibits normal muscle tone. Coordination normal.  Skin: Skin is warm and dry. No rash noted. She is not diaphoretic. No erythema. No pallor.  Psychiatric: She has a normal mood and affect. Her behavior is normal. Judgment and thought content normal.           Assessment & Plan:

## 2012-05-12 NOTE — Assessment & Plan Note (Signed)
Discussed screening Ct chest for early detection of lung cancer Explained that in age 66-75 and smoking history, annual low dose CT chest can pick up lung cancer early and has potential to save lives and cure lung cancer This is similar to screening mammogram, colonoscopies and pap smears Explained Ct scan is low dose radiation Explained early lung cancer is curable but asymptomatic and best way to detect is CT scan Explained CT superior to CXR Explained that false positives are present and can incur cost and workup like biopsies, additional scan but benefit outweighs risk Explained currently out of pocket $300  Based on above, she will decide later on to have CT chest or not

## 2012-05-12 NOTE — Patient Instructions (Addendum)
#  Lung cancer screening  - we discussed this. Anytime you want to have this scan let us know. Keep in mind $300 out of pocket cost  #COPD flare You have mild attack of copd called COPD exacerbation Get a breathing treatment and steroid shot IM depot medrol 56m in office right now Please take levaquin 5066monce daily  X 6 days Please take prednisone 4047mnce daily x 3 days, then 69m103mce daily x 3 days, then 10mg38me daily x 3 days, then 5mg o35m dailyx 3 days and stop Return to see me after 3 months

## 2012-05-12 NOTE — Addendum Note (Signed)
Addended by: Lilli Few on: 05/12/2012 02:51 PM   Modules accepted: Orders

## 2012-05-28 ENCOUNTER — Telehealth: Payer: Self-pay | Admitting: Internal Medicine

## 2012-05-28 MED ORDER — LEVOFLOXACIN 500 MG PO TABS: 500.0000 mg | ORAL_TABLET | Freq: Every day | ORAL | Status: DC

## 2012-05-28 MED ORDER — IPRATROPIUM-ALBUTEROL 0.5-2.5 (3) MG/3ML IN SOLN: 3.0000 mL | Freq: Four times a day (QID) | RESPIRATORY_TRACT | Status: DC | PRN

## 2012-05-28 MED ORDER — PREDNISONE 10 MG PO TABS: ORAL_TABLET | ORAL | Status: DC

## 2012-05-28 NOTE — Telephone Encounter (Signed)
Called and spoke with pt and she stated that she has had a scratchy throat x 2-3 days and yesterday morning she got up with green sputum in the morning but as the day goes by it turns to white sticky sputum.  Pt is requesting recs from MR.  Please advise. Thanks  No Known Allergies

## 2012-05-28 NOTE — Telephone Encounter (Signed)
Sounds like AECOPD    take levaquin 559m once daily  X 6 days Take prednisone 40 mg daily x 2 days, then 256mdaily x 2 days, then 1029maily x 2 days, then 5mg29mily x 2 days and stop  If worse go to ER

## 2012-05-28 NOTE — Telephone Encounter (Signed)
Called and spoke with pt and she is aware of MR recs and is aware that these meds have been sent to her pharmacy and if she gets worse to go to the ER for eval. Pt voiced her understanding and nothing further is needed.

## 2012-06-30 ENCOUNTER — Other Ambulatory Visit: Payer: Self-pay | Admitting: Internal Medicine

## 2012-07-02 MED ORDER — FLUTICASONE-SALMETEROL 250-50 MCG/DOSE IN AEPB: 1.0000 | INHALATION_SPRAY | Freq: Two times a day (BID) | RESPIRATORY_TRACT | Status: DC

## 2012-07-02 NOTE — Addendum Note (Signed)
Addended by: Lilli Few on: 07/02/2012 03:54 PM   Modules accepted: Orders

## 2012-07-22 ENCOUNTER — Other Ambulatory Visit: Payer: Self-pay | Admitting: Internal Medicine

## 2012-07-23 ENCOUNTER — Other Ambulatory Visit: Payer: Self-pay | Admitting: Internal Medicine

## 2012-07-26 ENCOUNTER — Telehealth: Payer: Self-pay | Admitting: Internal Medicine

## 2012-07-26 MED ORDER — TIOTROPIUM BROMIDE MONOHYDRATE 18 MCG IN CAPS: 18.0000 ug | ORAL_CAPSULE | Freq: Every day | RESPIRATORY_TRACT | Status: DC

## 2012-07-26 MED ORDER — FLUTICASONE-SALMETEROL 250-50 MCG/DOSE IN AEPB: 1.0000 | INHALATION_SPRAY | Freq: Two times a day (BID) | RESPIRATORY_TRACT | Status: DC

## 2012-07-26 MED ORDER — LOSARTAN POTASSIUM 50 MG PO TABS: 50.0000 mg | ORAL_TABLET | Freq: Every day | ORAL | Status: DC

## 2012-07-26 NOTE — Telephone Encounter (Signed)
Spoke with pt She states needs refills on advair, spiriva, and cozaar Rxs were sent to pharm Nothing further needed

## 2012-07-26 NOTE — Telephone Encounter (Signed)
I spoke with pt. She is requesting samples of wellbutrin. I advised her we did not have any samples. She is wanting to know if MR will RX this for her to help her quit smoking. She states she has been on this before and has ran out. Please advise thanks

## 2012-07-27 MED ORDER — BUPROPION HCL ER (SR) 150 MG PO TB12: ORAL_TABLET | ORAL | Status: DC

## 2012-07-27 NOTE — Telephone Encounter (Signed)
Spoke with pt and notified of recs per MR She verbalized understanding and states nothing further needed Rx was sent to pharm

## 2012-07-27 NOTE — Telephone Encounter (Signed)
lmomtcb x1 for pt 

## 2012-07-27 NOTE — Telephone Encounter (Signed)
She can have Wellbutrin as long as there is no prior history of seizure or current history of cirrhosis  STart wellbutrin:   Initial: 150 mg once daily for 3 days; increase to 150 mg twice daily treatment which you should continue for 12 weeks  Also, one week after your start wellbutrin quit smoking   Thanks MR

## 2012-07-27 NOTE — Telephone Encounter (Signed)
Pt returned call [name]  

## 2012-08-02 ENCOUNTER — Other Ambulatory Visit: Payer: Self-pay | Admitting: Internal Medicine

## 2012-08-17 ENCOUNTER — Ambulatory Visit (INDEPENDENT_AMBULATORY_CARE_PROVIDER_SITE_OTHER): Payer: 59 | Admitting: Internal Medicine

## 2012-08-17 ENCOUNTER — Encounter: Payer: Self-pay | Admitting: Internal Medicine

## 2012-08-17 ENCOUNTER — Encounter: Payer: Self-pay | Admitting: *Deleted

## 2012-08-17 VITALS — BP 116/74 | HR 110 | Temp 99.3°F | Ht 62.5 in | Wt 98.0 lb

## 2012-08-17 DIAGNOSIS — J441 Chronic obstructive pulmonary disease with (acute) exacerbation: Secondary | ICD-10-CM

## 2012-08-17 MED ORDER — PREDNISONE 10 MG PO TABS: ORAL_TABLET | ORAL | Status: DC

## 2012-08-17 MED ORDER — DOXYCYCLINE HYCLATE 100 MG PO TABS: 100.0000 mg | ORAL_TABLET | Freq: Two times a day (BID) | ORAL | Status: DC

## 2012-08-17 NOTE — Progress Notes (Signed)
Subjective:    Patient ID: [name], female    DOB: 01-28-1946, 67 y.o.   MRN: [id number]  HPI PCP is Woody Seller, MD  She is a weaver at [employer]    #Smoker Smoker - STarted age 10. 35 pack per day. Quit end 2012 for first time with help of wellbutrin and patch.  But recently 58 year old granddaughter from congential liver disease ("cirrhosis") moved back with her and since then smoking. Patient is primary health care [facility] because her daughter and 49 year old do not get along.  Smokes 1 ppd (used to smoke 2 ppd)  #Chronic cough  - Stop ACE inhibitor fall 2013  #Lung cancer screening  -  CXR 11/18/11: Severe COPD/emphysema. Biapical and bibasilar pleuroparenchymal  scarring. No acute cardiopulmonary disease. - November 2013: Discussed CT scan of the chest but she deferred due to cost  #COPD Gold stage III PFT 12/10/2011  - Gold stage 3 copd - fev1 0.8L/42%, 20% BD respinse. TLC 133%, DLCO 46% - Walk test in 2013   - pulse ox 88% at first lap 185 feet but increased to 91% at 3rd lap end  #AECOPD  - OV 05/12/2012 - treated with Levaquin and prednisone - OV 08/17/2012 - treat with doxycycline and prednisone -> start N. acetylcysteine   OV 08/17/2012  Followup COPD  - Last seen November 2013 . After treating that exacerbation she was feeling better. However now starting 08/05/2012 she's had increasing cough, chest tightness, dyspnea and increased sputum production. This is all worse compared to baseline. Severity is defined as moderate to severe but not severe enough to need admission. COPD cat score is 31 and is worse than her baseline as seen below and a similar to recent exacerbation. Symptoms are relieved partially by her medications Spiriva and Advair and albuterol. She insists she is compliant with her medications. However she continues to smoke though she had a sense a need to quit smoking. Denies chest pain, edema, hemoptysis, fever also noticed to have a  low-grade temperature of 99 Fahrenheit in the office. Of note, she says insurance is going to have her pay for the nocturnal oxygen so she is going to discontinue this    CAT COPD Symptom and Quality of Life Score ([employer])  11/18/11 03/24/2012  05/12/2012 aecopd 08/17/2012 aecopd  Never Cough -> Cough all the time _0 No phlegm in chest -> Chest is full of phlegm _1 No chest tightness -> Chest feels very tight _2 No dyspnea for 1 flight stairs/hill -> Very dyspneic for 1 flight of stairs _3 No limitations for ADL at home -> Very limited with ADL at home _4 Confident leaving home -> Not at all confident leaving home 0 _5 Sleep soundly -> Do not sleep soundly because of lung condition _6 Lots of Energy -> No energy at all _7 TOTAL Score (max 40)  _8 Review of Systems  Constitutional: Negative for fever and unexpected weight change.  HENT: Positive for rhinorrhea. Negative for ear pain, nosebleeds, congestion, sore throat, sneezing, trouble swallowing, dental problem, postnasal drip and sinus pressure.   Eyes: Negative for redness and itching.  Respiratory: Positive for cough, chest tightness, shortness of breath and wheezing.   Cardiovascular:  Negative for palpitations and leg swelling.  Gastrointestinal: Negative for nausea and vomiting.  Genitourinary: Negative for dysuria.  Musculoskeletal: Negative for joint swelling.  Skin: Negative for rash.  Neurological: Negative for headaches.  Hematological: Does not bruise/bleed easily.  Psychiatric/Behavioral: Negative for dysphoric mood. The patient is not nervous/anxious.        Objective:   Physical Exam HENT:  Head: Normocephalic and atraumatic.  Right Ear: External ear normal.  Left Ear: External ear normal.  Mouth/Throat: Oropharynx is clear and moist. No oropharyngeal exudate.  Eyes: Conjunctivae and EOM are normal. Pupils are equal, round,  and reactive to light. Right eye exhibits no discharge. Left eye exhibits no discharge. No scleral icterus.  Neck: Normal range of motion. Neck supple. No JVD present. No tracheal deviation present. No thyromegaly present.  Cardiovascular: Normal rate, regular rhythm, normal heart sounds and intact distal pulses.  Exam reveals no gallop and no friction rub.   No murmur heard. Pulmonary/Chest: Effort normal and breath sounds normal. No respiratory distress. She has MILDwheezes. She has no rales. She exhibits no tenderness.  Abdominal: Soft. Bowel sounds are normal. She exhibits no distension and no mass. There is no tenderness. There is no rebound and no guarding.  Musculoskeletal: Normal range of motion. She exhibits no edema and no tenderness.  Lymphadenopathy:    She has no cervical adenopathy.  Neurological: She is alert and oriented to person, place, and time. She has normal reflexes. No cranial nerve deficit. She exhibits normal muscle tone. Coordination normal.  Skin: Skin is warm and dry. No rash noted. She is not diaphoretic. No erythema. No pallor.  Psychiatric: She has a normal mood and affect. Her behavior is normal. Judgment and thought content normal.           Assessment & Plan:

## 2012-08-17 NOTE — Assessment & Plan Note (Signed)
You have mild attack of copd called COPD exacerbation Please take doxycycline 171m twice daily after meals x 5 days; avoid sunlight Please take prednisone 493monce daily x 3 days, then 2059mnce daily x 3 days, then 48m19mce daily x 3 days, then 5mg 53me dailyx 3 days and stop Start N. acetylcysteine 600 mg twice a day  - ChineMongoliay published in [employer]014 shows it helps   - this is not to make you feel better but to to prevent recurrent bronchitis episodes  - You are more likely to get this drug at [facility] vitamin store then at [employer]  - You can also get this at website www.vWholesaleCream.sit functions as an anti-oxidant and there are minimal/none side effects Also need to quit smoking to prevent frequent exacerbations as one benefit from quitting smoking  Followup 3 months or sooner if needed

## 2012-08-17 NOTE — Patient Instructions (Addendum)
You have mild attack of copd called COPD exacerbation Please take doxycycline 172m twice daily after meals x 5 days; avoid sunlight Please take prednisone 434monce daily x 3 days, then 2042mnce daily x 3 days, then 68m99mce daily x 3 days, then 5mg 42me dailyx 3 days and stop Start N. acetylcysteine 600 mg twice a day  - ChineMongoliay published in [employer]014 shows it helps   - this is not to make you feel better but to to prevent recurrent bronchitis episodes  - You are more likely to get this drug at [facility] [facility] vitamin store then at [employer]  - You can also get this at website www.vWholesaleCream.sit functions as an anti-oxidant and there are minimal/none side effects Also need to quit smoking to prevent frequent exacerbations as one benefit from quitting smoking  Followup 3 months or sooner if needed

## 2012-08-19 ENCOUNTER — Emergency Department (HOSPITAL_COMMUNITY): Payer: 59

## 2012-08-19 ENCOUNTER — Encounter (HOSPITAL_COMMUNITY): Payer: Self-pay | Admitting: *Deleted

## 2012-08-19 ENCOUNTER — Inpatient Hospital Stay (HOSPITAL_COMMUNITY)
Admission: EM | Admit: 2012-08-19 | Discharge: 2012-08-21 | DRG: 190 | Disposition: A | Discharge status: Home / Self Care | Payer: 59 | Attending: Internal Medicine | Admitting: Internal Medicine

## 2012-08-19 ENCOUNTER — Telehealth: Payer: Self-pay | Admitting: Internal Medicine

## 2012-08-19 ENCOUNTER — Telehealth: Payer: Self-pay | Admitting: *Deleted

## 2012-08-19 DIAGNOSIS — IMO0001 Reserved for inherently not codable concepts without codable children: Secondary | ICD-10-CM

## 2012-08-19 DIAGNOSIS — J449 Chronic obstructive pulmonary disease, unspecified: Secondary | ICD-10-CM

## 2012-08-19 DIAGNOSIS — E871 Hypo-osmolality and hyponatremia: Secondary | ICD-10-CM | POA: Diagnosis present

## 2012-08-19 DIAGNOSIS — Z79899 Other long term (current) drug therapy: Secondary | ICD-10-CM

## 2012-08-19 DIAGNOSIS — J9600 Acute respiratory failure, unspecified whether with hypoxia or hypercapnia: Secondary | ICD-10-CM

## 2012-08-19 DIAGNOSIS — R918 Other nonspecific abnormal finding of lung field: Secondary | ICD-10-CM

## 2012-08-19 DIAGNOSIS — F17200 Nicotine dependence, unspecified, uncomplicated: Secondary | ICD-10-CM

## 2012-08-19 DIAGNOSIS — J441 Chronic obstructive pulmonary disease with (acute) exacerbation: Principal | ICD-10-CM

## 2012-08-19 DIAGNOSIS — IMO0002 Reserved for concepts with insufficient information to code with codable children: Secondary | ICD-10-CM

## 2012-08-19 DIAGNOSIS — R0902 Hypoxemia: Secondary | ICD-10-CM

## 2012-08-19 DIAGNOSIS — R911 Solitary pulmonary nodule: Secondary | ICD-10-CM

## 2012-08-19 DIAGNOSIS — I10 Essential (primary) hypertension: Secondary | ICD-10-CM

## 2012-08-19 DIAGNOSIS — R0602 Shortness of breath: Secondary | ICD-10-CM

## 2012-08-19 DIAGNOSIS — R053 Chronic cough: Secondary | ICD-10-CM

## 2012-08-19 DIAGNOSIS — F17210 Nicotine dependence, cigarettes, uncomplicated: Secondary | ICD-10-CM | POA: Diagnosis present

## 2012-08-19 DIAGNOSIS — Z129 Encounter for screening for malignant neoplasm, site unspecified: Secondary | ICD-10-CM

## 2012-08-19 LAB — CBC
MCHC: 34.5 g/dL (ref 30.0–36.0)
MCV: 89.3 fL (ref 78.0–100.0)
Platelets: 232 10*3/uL (ref 150–400)
RDW: 13.3 % (ref 11.5–15.5)
WBC: 5.5 10*3/uL (ref 4.0–10.5)

## 2012-08-19 LAB — BASIC METABOLIC PANEL
BUN: 16 mg/dL (ref 6–23)
CO2: 26 mEq/L (ref 19–32)
Calcium: 9.3 mg/dL (ref 8.4–10.5)
Creatinine, Ser: 0.6 mg/dL (ref 0.50–1.10)

## 2012-08-19 LAB — PRO B NATRIURETIC PEPTIDE: Pro B Natriuretic peptide (BNP): 102 pg/mL (ref 0–125)

## 2012-08-19 MED ORDER — ALUM & MAG HYDROXIDE-SIMETH 200-200-20 MG/5ML PO SUSP: 30.0000 mL | Freq: Four times a day (QID) | ORAL | Status: DC | PRN

## 2012-08-19 MED ORDER — ENOXAPARIN SODIUM 40 MG/0.4ML ~~LOC~~ SOLN
40.0000 mg | Freq: Every day | SUBCUTANEOUS | Status: DC
  Administered 2012-08-19 – 2012-08-20 (×2): 40 mg via SUBCUTANEOUS
  Filled 2012-08-19 (×3): qty 0.4

## 2012-08-19 MED ORDER — ONDANSETRON HCL 4 MG/2ML IJ SOLN: 4.0000 mg | Freq: Four times a day (QID) | INTRAMUSCULAR | Status: DC | PRN

## 2012-08-19 MED ORDER — NICOTINE 14 MG/24HR TD PT24
14.0000 mg | MEDICATED_PATCH | Freq: Every day | TRANSDERMAL | Status: DC
  Administered 2012-08-19 – 2012-08-21 (×4): 14 mg via TRANSDERMAL
  Filled 2012-08-19 (×3): qty 1

## 2012-08-19 MED ORDER — SODIUM CHLORIDE 0.9 % IJ SOLN
3.0000 mL | Freq: Two times a day (BID) | INTRAMUSCULAR | Status: DC
  Administered 2012-08-19 – 2012-08-21 (×4): 3 mL via INTRAVENOUS

## 2012-08-19 MED ORDER — METHYLPREDNISOLONE SODIUM SUCC 40 MG IJ SOLR
40.0000 mg | Freq: Four times a day (QID) | INTRAMUSCULAR | Status: DC
  Administered 2012-08-19 – 2012-08-20 (×3): 40 mg via INTRAVENOUS
  Filled 2012-08-19 (×6): qty 1

## 2012-08-19 MED ORDER — HYDROCODONE-ACETAMINOPHEN 5-325 MG PO TABS
1.0000 | ORAL_TABLET | ORAL | Status: DC | PRN
  Administered 2012-08-21: 1 via ORAL
  Filled 2012-08-19: qty 1

## 2012-08-19 MED ORDER — ONDANSETRON HCL 4 MG PO TABS: 4.0000 mg | ORAL_TABLET | Freq: Four times a day (QID) | ORAL | Status: DC | PRN

## 2012-08-19 MED ORDER — ALBUTEROL SULFATE (5 MG/ML) 0.5% IN NEBU
5.0000 mg | INHALATION_SOLUTION | Freq: Once | RESPIRATORY_TRACT | Status: AC
  Administered 2012-08-19: 5 mg via RESPIRATORY_TRACT
  Filled 2012-08-19: qty 1

## 2012-08-19 MED ORDER — SODIUM CHLORIDE 0.9 % IV SOLN: 250.0000 mL | INTRAVENOUS | Status: DC | PRN

## 2012-08-19 MED ORDER — ACETAMINOPHEN 325 MG PO TABS: 650.0000 mg | ORAL_TABLET | Freq: Four times a day (QID) | ORAL | Status: DC | PRN

## 2012-08-19 MED ORDER — IPRATROPIUM BROMIDE 0.02 % IN SOLN
0.5000 mg | Freq: Four times a day (QID) | RESPIRATORY_TRACT | Status: DC
  Administered 2012-08-20 – 2012-08-21 (×6): 0.5 mg via RESPIRATORY_TRACT
  Filled 2012-08-19 (×6): qty 2.5

## 2012-08-19 MED ORDER — LOSARTAN POTASSIUM 50 MG PO TABS
50.0000 mg | ORAL_TABLET | Freq: Every morning | ORAL | Status: DC
  Administered 2012-08-20 – 2012-08-21 (×2): 50 mg via ORAL
  Filled 2012-08-19 (×2): qty 1

## 2012-08-19 MED ORDER — ALBUTEROL SULFATE (5 MG/ML) 0.5% IN NEBU: 2.5000 mg | INHALATION_SOLUTION | RESPIRATORY_TRACT | Status: DC | PRN

## 2012-08-19 MED ORDER — IPRATROPIUM BROMIDE 0.02 % IN SOLN
0.5000 mg | Freq: Once | RESPIRATORY_TRACT | Status: AC
  Administered 2012-08-19: 0.5 mg via RESPIRATORY_TRACT
  Filled 2012-08-19: qty 2.5

## 2012-08-19 MED ORDER — ACETAMINOPHEN 650 MG RE SUPP: 650.0000 mg | Freq: Four times a day (QID) | RECTAL | Status: DC | PRN

## 2012-08-19 MED ORDER — LEVOFLOXACIN 500 MG PO TABS
500.0000 mg | ORAL_TABLET | Freq: Every day | ORAL | Status: DC
  Administered 2012-08-20 – 2012-08-21 (×2): 500 mg via ORAL
  Filled 2012-08-19 (×2): qty 1

## 2012-08-19 MED ORDER — SODIUM CHLORIDE 0.9 % IV SOLN
Freq: Once | INTRAVENOUS | Status: AC
  Administered 2012-08-19: 16:00:00 via INTRAVENOUS

## 2012-08-19 MED ORDER — SODIUM CHLORIDE 0.9 % IJ SOLN: 3.0000 mL | INTRAMUSCULAR | Status: DC | PRN

## 2012-08-19 MED ORDER — IOHEXOL 350 MG/ML SOLN
100.0000 mL | Freq: Once | INTRAVENOUS | Status: AC | PRN
  Administered 2012-08-19: 100 mL via INTRAVENOUS

## 2012-08-19 MED ORDER — ALBUTEROL SULFATE (5 MG/ML) 0.5% IN NEBU
2.5000 mg | INHALATION_SOLUTION | Freq: Four times a day (QID) | RESPIRATORY_TRACT | Status: DC
  Administered 2012-08-20 – 2012-08-21 (×6): 2.5 mg via RESPIRATORY_TRACT
  Filled 2012-08-19 (×6): qty 0.5

## 2012-08-19 MED ORDER — ALBUTEROL (5 MG/ML) CONTINUOUS INHALATION SOLN
10.0000 mg/h | INHALATION_SOLUTION | Freq: Once | RESPIRATORY_TRACT | Status: AC
  Administered 2012-08-19: 10 mg/h via RESPIRATORY_TRACT
  Filled 2012-08-19: qty 20

## 2012-08-19 NOTE — Telephone Encounter (Signed)
Error  

## 2012-08-19 NOTE — ED Provider Notes (Addendum)
History     CSN: [id number]  Arrival date & time 08/19/12  1431   First MD Initiated Contact with Patient 08/19/12 [phone]      Chief Complaint  Patient presents with  . COPD  . Shortness of Breath    (Consider location/radiation/quality/duration/timing/severity/associated sxs/prior treatment) HPI Comments: Pt comes in with cc of copd and dib. Pt has hx of COPD, well controlled, no ED visits usually. Pt has been feeling unwell the past few days. She has some right sided chest pain, and gets short of breath easy. She has been taking her meds as prescribed. She saw her Pulmonologist recently too, and is being treated as COPD exacerbation - but her sx have not improved. She denies any hx of CAD. She has no hx of PE, DVT. PT has no acute blood loss.   Patient is a 67 y.o. female presenting with shortness of breath. The history is provided by the patient.  Shortness of Breath Associated symptoms: cough   Associated symptoms: no abdominal pain, no fever, no neck pain, no vomiting and no wheezing     Past Medical History  Diagnosis Date  . COPD (chronic obstructive pulmonary disease)   . Hypoxemia   . Collapsed lung     Past Surgical History  Procedure Laterality Date  . Lung surgery  1980    tube placed for pneumothorax    Family History  Problem Relation Age of Onset  . Heart disease Father   . Kidney failure Father     History  Substance Use Topics  . Smoking status: Current Every Day Smoker -- 1.00 packs/day for 53 years    Types: Cigarettes  . Smokeless tobacco: Not on file  . Alcohol Use: Yes     Comment: couple of beers a day    OB History   Grav Para Term Preterm Abortions TAB SAB Ect Mult Living                  Review of Systems  Constitutional: Positive for activity change. Negative for fever and chills.  HENT: Negative for facial swelling and neck pain.   Respiratory: Positive for cough, chest tightness and shortness of breath. Negative for wheezing and  stridor.   Cardiovascular: Negative for palpitations and leg swelling.  Gastrointestinal: Negative for nausea, vomiting, abdominal pain, diarrhea, constipation, blood in stool and abdominal distention.  Genitourinary: Negative for hematuria and difficulty urinating.  Skin: Negative for color change.  Neurological: Negative for speech difficulty.  Hematological: Does not bruise/bleed easily.  Psychiatric/Behavioral: Negative for confusion.    Allergies  Review of patient's allergies indicates no known allergies.  Home Medications   Current Outpatient Rx  Name  Route  Sig  Dispense  Refill  . doxycycline (VIBRA-TABS) 100 MG tablet   Oral   Take 1 tablet (100 mg total) by mouth 2 (two) times daily.   10 tablet   0   . Fluticasone-Salmeterol (ADVAIR DISKUS) 250-50 MCG/DOSE AEPB   Inhalation   Inhale 1 puff into the lungs 2 (two) times daily.   60 each   5   . Garlic 10 MG CAPS   Oral   Take 1 tablet by mouth daily.         Marland Kitchen glucosamine-chondroitin 500-400 MG tablet   Oral   Take 1 tablet by mouth 2 (two) times daily.         . Ipratropium-Albuterol (COMBIVENT) 20-100 MCG/ACT AERS respimat   Inhalation   Inhale 1 puff  into the lungs every 6 (six) hours as needed for wheezing or shortness of breath.         Marland Kitchen ipratropium-albuterol (DUONEB) 0.5-2.5 (3) MG/3ML SOLN   Nebulization   Take 3 mLs by nebulization every 6 (six) hours as needed (FOR WHEEZING OR SHORTNESS OF BREATH).         Marland Kitchen losartan (COZAAR) 50 MG tablet   Oral   Take 50 mg by mouth every morning.         . predniSONE (DELTASONE) 10 MG tablet   Oral   Take by mouth daily. Take 4 tabs daily x 3 days, then 2 tabs daily x 3 days, then 1 tab daily x 3 days, then 1/2 tab daily x 3 days, then stop.         Marland Kitchen tiotropium (SPIRIVA) 18 MCG inhalation capsule   Inhalation   Place 1 capsule (18 mcg total) into inhaler and inhale daily.   30 capsule   5   . triamterene-hydrochlorothiazide (MAXZIDE-25)  37.5-25 MG per tablet   Oral   Take 1 tablet by mouth every morning.         . vitamin C (ASCORBIC ACID) 500 MG tablet   Oral   Take 500 mg by mouth daily.         . vitamin E 400 UNIT capsule   Oral   Take 400 Units by mouth daily.           BP 123/74  Pulse 120  Temp(Src) 98.4 F (36.9 C) (Oral)  Resp 18  SpO2 94%  Physical Exam  Nursing note and vitals reviewed. Constitutional: She is oriented to person, place, and time. She appears well-developed and well-nourished.  HENT:  Head: Normocephalic and atraumatic.  Eyes: EOM are normal. Pupils are equal, round, and reactive to light.  Neck: Neck supple.  Cardiovascular: Regular rhythm and normal heart sounds.   No murmur heard. Pulmonary/Chest: Effort normal. No respiratory distress. She has no wheezes.  Poor air entry bilaterally  Abdominal: Soft. She exhibits no distension. There is no tenderness. There is no rebound and no guarding.  Neurological: She is alert and oriented to person, place, and time.  Skin: Skin is warm and dry.    ED Course  Procedures (including critical care time)  Labs Reviewed  BASIC METABOLIC PANEL - Abnormal; Notable for the following:    Sodium 132 (*)    Chloride 91 (*)    Glucose, Bld 178 (*)    All other components within normal limits  CBC - Abnormal; Notable for the following:    Hemoglobin 15.3 (*)    All other components within normal limits  PRO B NATRIURETIC PEPTIDE  POCT I-STAT TROPONIN I   Dg Chest 2 View (if Patient Has Fever And/or Copd)  08/19/2012  *RADIOLOGY REPORT*  Clinical Data: COPD, shortness of breath, dry cough, smoker  CHEST - 2 VIEW  Comparison: 11/18/2011  Findings: Normal heart size, mediastinal contours, and pulmonary vascularity. Emphysematous and bronchitic changes consistent with COPD. New nodular density in right mid lung, 7 mm diameter. Remaining lungs clear. No pleural effusion or pneumothorax. Bones diffusely demineralized.  IMPRESSION: COPD  changes with new 7 mm right mid lung nodular density; CT chest recommended to exclude pulmonary nodule.   Original Report Authenticated By: Lavonia Dana, M.D.      No diagnosis found.    MDM   Date: 08/19/2012  Rate: 111  Rhythm: sinus tachycardia  QRS Axis: normal  Intervals:  normal  ST/T Wave abnormalities: nonspecific ST/T changes  Conduction Disutrbances:none  Narrative Interpretation:   Old EKG Reviewed: none available  Differential diagnosis includes: ACS syndrome CHF exacerbation Valvular disorder Myocarditis Pericarditis Pericardial effusion Pneumonia Pleural effusion Pulmonary edema PE Anemia Musculoskeletal pain  Pt comes in with cc of DIB. She has no hx of CHF, or cardiac dz.  Her Cardiac exam is unremarkable, except for the tachycardia. Her Cardiac risk factors are:   She has hx of COPD, poor air entry  - but no wheezing and no crackles. We will get CXR and BNP to r/o pulm edema, CHF.  She is tachycardic. Her WELLS score is 1.5 at this time. I will give her a breathing tx and reassess. If no change at all - she will need a CT PE (PE will go high on the ddx and her wells will become 4.5).  Varney Biles, MD 08/19/12 1702  5:31 PM No true improvement with 2 breathing tx. Will get CT PE Will get ambulatory pulsox. Hydration continued. BNP is < 100 , effective ruling out CHF  Varney Biles, MD 08/19/12 1731  Varney Biles, MD 08/19/12 1732  7:34 PM Pt's pulsox drops to 85% with ambulation, and she gets visibly dyspneic. Does have home O2, but its prn only. She is still tachycardic. No PE - but there is a mass in the RLL, concerning for cancer. Report handed to the patient. She has extensive smoking hx. Will admit.   Varney Biles, MD 08/19/12 1935

## 2012-08-19 NOTE — Telephone Encounter (Signed)
Called, spoke with pt.  She was seen by MR on March 4:  Patient Instructions    You have mild attack of copd called COPD exacerbation  Please take doxycycline 118m twice daily after meals x 5 days; avoid sunlight  Please take prednisone 444monce daily x 3 days, then 2033mnce daily x 3 days, then 66m66mce daily x 3 days, then 5mg 41me dailyx 3 days and stop  Start N. acetylcysteine 600 mg twice a day  - ChineMongoliay published in [employer] shows it helps  - this is not to make you feel better but to to prevent recurrent bronchitis episodes  - You are more likely to get this drug at [employer] [facility] vitamin store then at [employer]  - You can also get this at website www.vWholesaleCream.sit functions as an anti-oxidant and there are minimal/none side effects  Also need to quit smoking to prevent frequent exacerbations as one benefit from quitting smoking  Followup 3 months or sooner if needed    --------  C/o HA, increased SOB, wheezing, dry cough, chest pressure, chest tightness, pain under right arm, chills, and temp of 98.9.  Reports she feels a little worse since OV.  Pt states she is taking the doxy and prednisone.  Has used albuterol neb and HFA today with no relief.  As pt was just seen 2 days ago, will forward msg to MR for further recs.  Pls advise. Thank you.

## 2012-08-19 NOTE — ED Notes (Signed)
Pt reports sob x4 days. Was started on doxycycline and prednisone. Used albuterol neb and spiriva today. Does not feel any improvement. Was advised to come to ED by PCP. C/o new pain to R upper back and feeling as if an elephant is sitting on her chest since today.

## 2012-08-19 NOTE — ED Notes (Signed)
Patient transported to CT 

## 2012-08-19 NOTE — Telephone Encounter (Signed)
Encounter closed by accident.  MR, pls advise. Thank you.

## 2012-08-19 NOTE — H&P (Signed)
History and Physical  [name] ZDG:[id number] DOB: 24-Apr-1946 DOA: 08/19/2012  Referring physician: Dr. Kathrynn Humble PCP: Woody Seller, MD  Pulmonologist: Brand Males, MD  Chief Complaint: Shortness of breath  HPI:  67 year old woman PMH COPD Gold stage III on night-time oxygen presented with increasing shortness of breath and cough. Noted to be hypoxic with ambulation and referred for admission for COPD exacerbation.  Reports less than 7 days of increasing shortness of breath and cough. Seen 3/4 by Dr. Chase Caller and diagnosed with COPD exacerbation and started on doxycycline and prednisone. Despite this intervention symptoms have worsened with increasing shortness of breath and cough. Difficult night 3/5. Was advised to come to ED today by pulmonologist. Reports right-sided rib pain "from coughing". She continues to smoke.  In ED: Afebrile, HR 120, vitals otherwise stable. Hypoxic with ambulation 85%.  Screening laboratory studies notable for mild hyponatremia and hypochloremia. EKG: 2 studies: Both sinus tachycardia, no acute changes. CT with right lower lobe nodule, possible malignancy. Received albuterol, Atrovent.  Chart Review:  Seen by pulmonology 3/4 for COPD exacerbation. Start on N. acetylcysteine 600 mg by mouth twice a day, doxycycline and prednisone.  Review of Systems:  Negative for visual changes, sore throat, rash, new muscle aches, dysuria, bleeding, n/v/abdominal pain.  Positive for low-grade fever, poor appetite.  Past Medical History  Diagnosis Date  . COPD (chronic obstructive pulmonary disease)   . Hypoxemia   . Collapsed lung     Past Surgical History  Procedure Laterality Date  . Lung surgery  1980    tube placed for pneumothorax    Social History:  reports that she has been smoking Cigarettes.  She has a 53 pack-year smoking history. She does not have any smokeless tobacco history on file. She reports that  drinks alcohol. She reports  that she does not use illicit drugs.  No Known Allergies  Family History  Problem Relation Age of Onset  . Heart disease Father   . Kidney failure Father      Prior to Admission medications   Medication Sig Start Date End Date Taking? Authorizing Provider  doxycycline (VIBRA-TABS) 100 MG tablet Take 1 tablet (100 mg total) by mouth 2 (two) times daily. 08/17/12  Yes Brand Males, MD  Fluticasone-Salmeterol (ADVAIR DISKUS) 250-50 MCG/DOSE AEPB Inhale 1 puff into the lungs 2 (two) times daily. 07/26/12  Yes Brand Males, MD  Garlic 10 MG CAPS Take 1 tablet by mouth daily.   Yes Historical Provider, MD  glucosamine-chondroitin 500-400 MG tablet Take 1 tablet by mouth 2 (two) times daily.   Yes Historical Provider, MD  Ipratropium-Albuterol (COMBIVENT) 20-100 MCG/ACT AERS respimat Inhale 1 puff into the lungs every 6 (six) hours as needed for wheezing or shortness of breath. 12/16/11  Yes Brand Males, MD  ipratropium-albuterol (DUONEB) 0.5-2.5 (3) MG/3ML SOLN Take 3 mLs by nebulization every 6 (six) hours as needed (FOR WHEEZING OR SHORTNESS OF BREATH). 05/28/12  Yes Brand Males, MD  losartan (COZAAR) 50 MG tablet Take 50 mg by mouth every morning. 07/26/12  Yes Brand Males, MD  predniSONE (DELTASONE) 10 MG tablet Take by mouth daily. Take 4 tabs daily x 3 days, then 2 tabs daily x 3 days, then 1 tab daily x 3 days, then 1/2 tab daily x 3 days, then stop. 08/17/12  Yes Brand Males, MD  tiotropium (SPIRIVA) 18 MCG inhalation capsule Place 1 capsule (18 mcg total) into inhaler and inhale daily. 07/26/12  Yes Brand Males, MD  triamterene-hydrochlorothiazide (MAXZIDE-25) 37.5-25  MG per tablet Take 1 tablet by mouth every morning.   Yes Historical Provider, MD  vitamin C (ASCORBIC ACID) 500 MG tablet Take 500 mg by mouth daily.   Yes Historical Provider, MD  vitamin E 400 UNIT capsule Take 400 Units by mouth daily.   Yes Historical Provider, MD   Physical Exam: Filed Vitals:    08/19/12 1439 08/19/12 1444 08/19/12 1542 08/19/12 1607  BP: 122/71 123/74    Pulse: 119 120    Temp: 98.4 F (36.9 C) 98.4 F (36.9 C)    TempSrc: Oral Oral    Resp: 16 18    SpO2: 94% 92% 100% 94%    General:  Examined in the emergency department. Appears calm and comfortable. Nontoxic. Eyes: PERRL, normal lids, irises ENT: grossly normal hearing, lips & tongue Neck: no LAD, masses or thyromegaly Cardiovascular: RRR, no m/r/g. No LE edema. Respiratory: Poor air movement but CTA bilaterally, no w/r/r. Mild increased respiratory effort. Able to speak in full sentences. Abdomen: soft, ntnd Skin: no rash or induration seen on limited exam Musculoskeletal: grossly normal tone BUE/BLE Psychiatric: grossly normal mood and affect, speech fluent and appropriate Neurologic: grossly non-focal.  Wt Readings from Last 3 Encounters:  08/17/12 44.453 kg (98 lb)  05/12/12 44.997 kg (99 lb 3.2 oz)  03/24/12 46.72 kg (103 lb)    Labs on Admission:  Basic Metabolic Panel:  Recent Labs Lab 08/19/12 1455  NA 132*  K 3.8  CL 91*  CO2 26  GLUCOSE 178*  BUN 16  CREATININE 0.60  CALCIUM 9.3   CBC:  Recent Labs Lab 08/19/12 1455  WBC 5.5  HGB 15.3*  HCT 44.3  MCV 89.3  PLT 232   Troponin (Point of Care Test)  Recent Labs  08/19/12 1504  TROPIPOC 0.00     Recent Labs  08/19/12 1455  PROBNP 102.0    Radiological Exams on Admission: Dg Chest 2 View (if Patient Has Fever And/or Copd)  08/19/2012  *RADIOLOGY REPORT*  Clinical Data: COPD, shortness of breath, dry cough, smoker  CHEST - 2 VIEW  Comparison: 11/18/2011  Findings: Normal heart size, mediastinal contours, and pulmonary vascularity. Emphysematous and bronchitic changes consistent with COPD. New nodular density in right mid lung, 7 mm diameter. Remaining lungs clear. No pleural effusion or pneumothorax. Bones diffusely demineralized.  IMPRESSION: COPD changes with new 7 mm right mid lung nodular density; CT chest  recommended to exclude pulmonary nodule.   Original Report Authenticated By: Lavonia Dana, M.D.    Ct Angio Chest Pe W/cm &/or Wo Cm  08/19/2012  *RADIOLOGY REPORT*  Clinical Data: Shortness of breath.  Smoker.  CT ANGIOGRAPHY CHEST  Technique:  Multidetector CT imaging of the chest using the standard protocol during bolus administration of intravenous contrast. Multiplanar reconstructed images including MIPs were obtained and reviewed to evaluate the vascular anatomy.  Contrast: 149m OMNIPAQUE IOHEXOL 350 MG/ML SOLN  Comparison: Chest x-ray earlier today.  Findings: Normal heart size. No hilar or mediastinal adenopathy. Negative osseous structures.  The radiology or  Good opacification of the pulmonary vasculature with no evidence for pulmonary emboli.  No infiltrates. Marked COPD with hyperinflation and extensive bilateral emphysematous change.  No pleural or pericardial effusion.  Coronary artery calcification is present in the left main, left anterior descending, and left circumflex.  8 mm right lower lobe nodule corresponds to the abnormality seen on chest x-ray.  It is noncalcified with spiculated margins. Given the patient's history of smoking, and interval development since  chest x-ray of 01/22/2008, the lesion is concerning for neoplasm. Percutaneous tissue sampling would be mild relatively high risk. PET scan could provide additional information  with regard to possible hypermetabolism.  If the patient is at high risk for bronchogenic carcinoma, follow-up chest CT at 3-6 months is recommended.   This recommendation follows the consensus statement: Guidelines for Management of Small Pulmonary Nodules Detected on CT Scans: A Statement from the [place] as published in Radiology 2005; 237:395-400.  IMPRESSION: 8 mm noncalcified right lower lobe nodule with irregular margins. Bronchogenic carcinoma not excluded.  No visible adenopathy or pleural effusion. Consider further investigation with PET  scanning. Alternatively, follow-up chest CT 3-6 months.  See discussion above.  No evidence for pulmonary emboli.   Original Report Authenticated By: Rolla Flatten, M.D.     EKG: Independently reviewed. As above.   Principal Problem:   Acute respiratory failure Active Problems:   COPD (chronic obstructive pulmonary disease)   Smoking   COPD exacerbation   Nodule of right lung   Assessment/Plan 1. Acute COPD exacerbation/acute hypoxic respiratory failure: Failed outpatient treatment with doxycycline and oral prednisone. Oxygen, IV steroids, nebulizers, antibiotics. 2. COPD Gold stage III: Plan as above. Will notify Dr. Chase Caller of admission. 3. 8 mm noncalcified right lower lobe nodule with irregular margins: Bronchogenic carcinoma not excluded. No visible adenopathy or pleural effusion. Consider further investigation with PET scanning as outpatient. Alternatively, follow-up chest CT 3-6 months. This was discussed with the patient at bedside. Can discuss further with Dr. Chase Caller. 4. Mild hyponatremia and hypochloremia: Likely diuretic induced. Hold Maxzide for now. Repeat basic metabolic panel morning. 5. Ongoing cigarette use: Recommend cessation.   Code Status: Full code Family Communication: None present Disposition Plan/Anticipated LOS: Admit inpatient bed, 2-3 days.  Time spent: 50 minutes  Murray Hodgkins, MD  [facility] Pager [phone] 08/19/2012, 7:41 PM

## 2012-08-19 NOTE — Telephone Encounter (Signed)
Spoke with patient--gave her MR recs--OV here vs ER visit. Patient would be seeing MW in office today if she were to come into office. Pt decided she will go on to the ER. Patient is going to go to [facility]  today.   Message will be sent to MR as FYI.

## 2012-08-19 NOTE — Telephone Encounter (Signed)
Given no relief with opd Rx, needs to go to ER or make another visit here. Prefer ER Today

## 2012-08-20 DIAGNOSIS — I10 Essential (primary) hypertension: Secondary | ICD-10-CM

## 2012-08-20 LAB — BASIC METABOLIC PANEL
Chloride: 98 mEq/L (ref 96–112)
Creatinine, Ser: 0.42 mg/dL — ABNORMAL LOW (ref 0.50–1.10)
GFR calc Af Amer: >90 mL/min (ref >90)
Potassium: 3.9 mEq/L (ref 3.5–5.1)

## 2012-08-20 MED ORDER — GUAIFENESIN ER 600 MG PO TB12
1200.0000 mg | ORAL_TABLET | Freq: Two times a day (BID) | ORAL | Status: DC
  Administered 2012-08-20 – 2012-08-21 (×2): 1200 mg via ORAL
  Filled 2012-08-20 (×3): qty 2

## 2012-08-20 MED ORDER — METHYLPREDNISOLONE SODIUM SUCC 40 MG IJ SOLR
40.0000 mg | Freq: Two times a day (BID) | INTRAMUSCULAR | Status: DC
  Administered 2012-08-21 (×2): 40 mg via INTRAVENOUS
  Filled 2012-08-20 (×2): qty 1

## 2012-08-20 NOTE — Progress Notes (Signed)
INITIAL NUTRITION ASSESSMENT  Pt meets criteria for moderate malnutrition MALNUTRITION in the context of chronic illness as evidenced by visible mild/moderate subcutaneous fat loss and muscle wasting throughout the body.  DOCUMENTATION CODES Per approved criteria  -Non-severe (moderate) malnutrition in the context of chronic illness -Underweight   INTERVENTION: - Encouraged pt to consume high calorie/protein diet - Pt not interested in nutritional supplements - Will continue to monitor   NUTRITION DIAGNOSIS: Unintended weight loss related to poor appetite, diarrhea as evidenced by pt statement.   Goal: 1. Pt to consume >90% of meals 2. Stable weight 3. No further diarrhea  Monitor:  Weights, labs, intake   Reason for Assessment: Underweight  67 y.o. female  Admitting Dx: Acute respiratory failure  ASSESSMENT: Pt with past history of COPD Gold Stage III on night-time oxygen. Pt admitted with increasing shortness of breath and cough. Met with pt who reports 10 pound unintended weight loss in the past 2 weeks. Pt reports for 1 week PTA she was having 4-5 episodes of diarrhea/day. Pt reports she was only eating 2-3 small meals that time compared to her usual 3 meals/day. Pt not on any nutritional supplements at home. Pt appears cachetic. Performed nutrition focused physical exam.   Nutrition Focused Physical Exam:  Subcutaneous Fat:  Orbital Region: mild/moderate wasting Upper Arm Region: mild/moderate wasting Thoracic and Lumbar Region: mild/moderate wasting  Muscle:  Temple Region: mild/moderate wasting Clavicle Bone Region: mild/moderate wasting Clavicle and Acromion Bone Region: mild/moderate wasting Scapular Bone Region: mild/moderate wasting Dorsal Hand: mild/moderate wasting Patellar Region: mild/moderate wasting Anterior Thigh Region: mild/moderate wasting Posterior Calf Region: mild/moderate wasting  Edema: None noted   Height: Ht Readings from Last 1  Encounters:  08/19/12 _0  (1.575 m)    Weight: Wt Readings from Last 1 Encounters:  08/19/12 98 lb (44.453 kg)    Ideal Body Weight: 110 lb  % Ideal Body Weight: 89  Wt Readings from Last 10 Encounters:  08/19/12 98 lb (44.453 kg)  08/17/12 98 lb (44.453 kg)  05/12/12 99 lb 3.2 oz (44.997 kg)  03/24/12 103 lb (46.72 kg)  12/10/11 104 lb (47.174 kg)  11/18/11 102 lb 9.6 oz (46.539 kg)    Usual Body Weight: 103 lb per pt  % Usual Body Weight: 95  BMI:  Body mass index is 17.92 kg/(m^2).  Estimated Nutritional Needs: Kcal: 1350-1550 Protein: 65-80g Fluid: 1.3-1.5L/day  Skin: Intact   Diet Order: General  EDUCATION NEEDS: -No education needs identified at this time   Intake/Output Summary (Last 24 hours) at 08/20/12 1618 Last data filed at 08/20/12 1402  Gross per 24 hour  Intake    720 ml  Output   1400 ml  Net   -680 ml    Last BM: PTA  Labs:   Recent Labs Lab 08/19/12 1455 08/20/12 0354  NA 132* 135  K 3.8 3.9  CL 91* 98  CO2 26 28  BUN 16 15  CREATININE 0.60 0.42*  CALCIUM 9.3 8.9  GLUCOSE 178* 155*    CBG (last 3)  No results found for this basename: GLUCAP,  in the last 72 hours  Scheduled Meds: . ipratropium  0.5 mg Nebulization Q6H   And  . albuterol  2.5 mg Nebulization Q6H  . enoxaparin (LOVENOX) injection  40 mg Subcutaneous QHS  . guaiFENesin  1,200 mg Oral BID  . levofloxacin  500 mg Oral Daily  . losartan  50 mg Oral q morning - 10a  . [START ON 08/21/2012]  methylPREDNISolone (SOLU-MEDROL) injection  40 mg Intravenous Q12H  . nicotine  14 mg Transdermal Daily  . sodium chloride  3 mL Intravenous Q12H    Continous Infusions:   Past Medical History  Diagnosis Date  . COPD (chronic obstructive pulmonary disease)   . Hypoxemia   . Collapsed lung     Past Surgical History  Procedure Laterality Date  . Lung surgery  1980    tube placed for pneumothorax     Mikey College MS, RD, LDN [phone] Pager [phone] After  Hours Pager

## 2012-08-20 NOTE — Progress Notes (Signed)
[facility] PROGRESS NOTE  [name] DSK:[id number] DOB: 07-19-1945 DOA: 08/19/2012 PCP: Woody Seller, MD  Assessment/Plan: 1.Acute COPD exacerbation/acute hypoxic respiratory failure: Failed outpatient treatment with doxycycline and oral prednisone.  -Continue Oxygen, start taperingIV steroids, continue nebulizers, antibiotics-IV Levaquin.  -Improving clinically but still short of breath with minimal exertion. 2.COPD Gold stage III: Plan as above.  -Dr. Chase Caller notified of admission per admitting MD.  3.8 mm noncalcified right lower lobe nodule with irregular margins: Bronchogenic carcinoma not excluded. No visible adenopathy or pleural effusion. Consider further investigation with PET scanning as outpatient. Alternatively, follow-up chest CT 3-6 months. This was discussed with the patient at bedside on admission. Dr. Chase Caller to follow for further recs.  4.Mild hyponatremia and hypochloremia: Likely diuretic induced. -Continue holding Maxzide, resolved. 5.Ongoing cigarette use: Recommend cessation   Code Status: Full code  Family Communication: None present  Disposition Plan: to home when stable   Consultants:    Procedures:  none  Antibiotics:  levaquin started on 3/6  HPI/Subjective: States still with cough,, breathing better but still SOB with min exertion  Objective: Filed Vitals:   08/19/12 2142 08/20/12 0235 08/20/12 0601 08/20/12 0757  BP: 114/67  128/89   Pulse: 118  94   Temp: 98.5 F (36.9 C)  98.4 F (36.9 C)   TempSrc: Oral  Oral   Resp: 18  18   Height: _0  (1.575 m)     Weight: 44.453 kg (98 lb)     SpO2: 97% 98% 99% 94%    Intake/Output Summary (Last 24 hours) at 08/20/12 1215 Last data filed at 08/20/12 0601  Gross per 24 hour  Intake    480 ml  Output   1000 ml  Net   -520 ml   Filed Weights   08/19/12 2142  Weight: 44.453 kg (98 lb)    Exam:   General: A&O x3, in NAD  Cardiovascular: RRR nl  S1S2  Respiratory:decreased air mov't bil, no wheezes  Abdomen: Soft NT/ND +BS  Extremities: No cyanosis or edema   Data Reviewed: Basic Metabolic Panel:  Recent Labs Lab 08/19/12 1455 08/20/12 0354  NA 132* 135  K 3.8 3.9  CL 91* 98  CO2 26 28  GLUCOSE 178* 155*  BUN 16 15  CREATININE 0.60 0.42*  CALCIUM 9.3 8.9   Liver Function Tests: No results found for this basename: AST, ALT, ALKPHOS, BILITOT, PROT, ALBUMIN,  in the last 168 hours No results found for this basename: LIPASE, AMYLASE,  in the last 168 hours No results found for this basename: AMMONIA,  in the last 168 hours CBC:  Recent Labs Lab 08/19/12 1455  WBC 5.5  HGB 15.3*  HCT 44.3  MCV 89.3  PLT 232   Cardiac Enzymes: No results found for this basename: CKTOTAL, CKMB, CKMBINDEX, TROPONINI,  in the last 168 hours BNP (last 3 results)  Recent Labs  08/19/12 1455  PROBNP 102.0   CBG: No results found for this basename: GLUCAP,  in the last 168 hours  No results found for this or any previous visit (from the past 240 hour(s)).   Studies: Dg Chest 2 View (if Patient Has Fever And/or Copd)  08/19/2012  *RADIOLOGY REPORT*  Clinical Data: COPD, shortness of breath, dry cough, smoker  CHEST - 2 VIEW  Comparison: 11/18/2011  Findings: Normal heart size, mediastinal contours, and pulmonary vascularity. Emphysematous and bronchitic changes consistent with COPD. New nodular density in right mid lung, 7 mm diameter. Remaining lungs clear.  No pleural effusion or pneumothorax. Bones diffusely demineralized.  IMPRESSION: COPD changes with new 7 mm right mid lung nodular density; CT chest recommended to exclude pulmonary nodule.   Original Report Authenticated By: Lavonia Dana, M.D.    Ct Angio Chest Pe W/cm &/or Wo Cm  08/19/2012  *RADIOLOGY REPORT*  Clinical Data: Shortness of breath.  Smoker.  CT ANGIOGRAPHY CHEST  Technique:  Multidetector CT imaging of the chest using the standard protocol during bolus  administration of intravenous contrast. Multiplanar reconstructed images including MIPs were obtained and reviewed to evaluate the vascular anatomy.  Contrast: 118m OMNIPAQUE IOHEXOL 350 MG/ML SOLN  Comparison: Chest x-ray earlier today.  Findings: Normal heart size. No hilar or mediastinal adenopathy. Negative osseous structures.  The radiology or  Good opacification of the pulmonary vasculature with no evidence for pulmonary emboli.  No infiltrates. Marked COPD with hyperinflation and extensive bilateral emphysematous change.  No pleural or pericardial effusion.  Coronary artery calcification is present in the left main, left anterior descending, and left circumflex.  8 mm right lower lobe nodule corresponds to the abnormality seen on chest x-ray.  It is noncalcified with spiculated margins. Given the patient's history of smoking, and interval development since chest x-ray of 01/22/2008, the lesion is concerning for neoplasm. Percutaneous tissue sampling would be mild relatively high risk. PET scan could provide additional information  with regard to possible hypermetabolism.  If the patient is at high risk for bronchogenic carcinoma, follow-up chest CT at 3-6 months is recommended.   This recommendation follows the consensus statement: Guidelines for Management of Small Pulmonary Nodules Detected on CT Scans: A Statement from the [place] published in Radiology 2005; 237:395-400.  IMPRESSION: 8 mm noncalcified right lower lobe nodule with irregular margins. Bronchogenic carcinoma not excluded.  No visible adenopathy or pleural effusion. Consider further investigation with PET scanning. Alternatively, follow-up chest CT 3-6 months.  See discussion above.  No evidence for pulmonary emboli.   Original Report Authenticated By: JRolla Flatten M.D.     Scheduled Meds: . ipratropium  0.5 mg Nebulization Q6H   And  . albuterol  2.5 mg Nebulization Q6H  . enoxaparin (LOVENOX) injection  40 mg  Subcutaneous QHS  . levofloxacin  500 mg Oral Daily  . losartan  50 mg Oral q morning - 10a  . methylPREDNISolone (SOLU-MEDROL) injection  40 mg Intravenous Q6H  . nicotine  14 mg Transdermal Daily  . sodium chloride  3 mL Intravenous Q12H   Continuous Infusions:   Principal Problem:   Acute respiratory failure Active Problems:   COPD (chronic obstructive pulmonary disease)   Smoking   COPD exacerbation   Nodule of right lung    Time spent: 2[facility] Pager [phone] If 7PM-7AM, please contact night-coverage at [email or web address], password TR[facility]3/12/2012, 12:15 PM  LOS: 1 day

## 2012-08-21 MED ORDER — LEVOFLOXACIN 500 MG PO TABS: 500.0000 mg | ORAL_TABLET | Freq: Every day | ORAL | Status: DC

## 2012-08-21 MED ORDER — GUAIFENESIN ER 600 MG PO TB12: 1200.0000 mg | ORAL_TABLET | Freq: Two times a day (BID) | ORAL | Status: DC

## 2012-08-21 MED ORDER — PREDNISONE 20 MG PO TABS: ORAL_TABLET | ORAL | Status: DC

## 2012-08-21 MED ORDER — ENOXAPARIN SODIUM 30 MG/0.3ML ~~LOC~~ SOLN
30.0000 mg | Freq: Every day | SUBCUTANEOUS | Status: DC
  Filled 2012-08-21: qty 0.3

## 2012-08-21 NOTE — Progress Notes (Signed)
Patient discharged to home via wheelchair, discharge instructions reviewed with patient who verbalized understanding, new RX's given to patient.

## 2012-08-23 ENCOUNTER — Encounter: Payer: Self-pay | Admitting: *Deleted

## 2012-08-23 ENCOUNTER — Telehealth: Payer: Self-pay | Admitting: Internal Medicine

## 2012-08-23 NOTE — Telephone Encounter (Signed)
Spoke with pt.  She was given rx to excuse her from work but returning on today.  She does not feel like she can go back to work yet and would like MR to extend the work note through this week; returning on Monday, March 17.  She does have a pending OV with MR on this Friday, March 14.  I spoke with MR.  He is ok with extending pt's return to work date to March 17.    Letter completed and given to pt.  She will keep OV with MR on March 14 and will call back if anything further is needed prior to this.

## 2012-08-27 ENCOUNTER — Ambulatory Visit (INDEPENDENT_AMBULATORY_CARE_PROVIDER_SITE_OTHER): Payer: 59 | Admitting: Internal Medicine

## 2012-08-27 ENCOUNTER — Encounter: Payer: Self-pay | Admitting: Internal Medicine

## 2012-08-27 ENCOUNTER — Encounter: Payer: Self-pay | Admitting: *Deleted

## 2012-08-27 VITALS — BP 120/78 | HR 112 | Temp 97.1°F | Ht 62.0 in | Wt 98.0 lb

## 2012-08-27 DIAGNOSIS — J449 Chronic obstructive pulmonary disease, unspecified: Secondary | ICD-10-CM

## 2012-08-27 DIAGNOSIS — IMO0001 Reserved for inherently not codable concepts without codable children: Secondary | ICD-10-CM

## 2012-08-27 DIAGNOSIS — R911 Solitary pulmonary nodule: Secondary | ICD-10-CM

## 2012-08-27 NOTE — Patient Instructions (Addendum)
#  COPD COPD - Glad you slowly getting better after recent flareup - Continue the medications and advice of the hospitalist who treated you - Continue your regular medications for COPD - Take another week off and slowly work on gaining her strength back - When financially possible I would recommend you take oxygen at night - When time allows I would recommend you to pulmonary rehabilitation - Start N. acetylcysteine 600 mg twice a day  - [place] study published in [place] in 2014 shows it helps   - this is not to make you feel better but to to prevent recurrent bronchitis episodes  - You are more likely to get this drug at [facility] or a vitamin store then at [employer] or [employer] or [employer]  - You can also get this at website [employer]  - It functions as an anti-oxidant and there are minimal/none side effects     #Lung nodule - Do PET scan into 10 weeks  #Smoking - Really important to quit smoking for all the various reasons as discussed  #Followup - 10 weeks with pet scan results

## 2012-08-27 NOTE — Progress Notes (Signed)
Subjective:    Patient ID: [name], female    DOB: Feb 16, 1946, 67 y.o.   MRN: [id number]  HPI   #Smoker Smoker - STarted age 51. 55 pack per day. Quit end 2012 for first time with help of wellbutrin and patch.  But recently 41 year old granddaughter from congential liver disease ("cirrhosis") moved back with her and since then smoking. Patient is primary health care Dhaval Woo because her daughter and 55 year old do not get along.  Smokes 1 ppd (used to smoke 2 ppd)  #Chronic cough  - Stop ACE inhibitor fall 2013  #Lung cancer screening  -  CT scan chest 08/19/2012: 106m noncalcified right lower lobe nodule with irregular margins. No evidence of pulmonary embolism, adenopathy or pleural effusion. CT does show significant bilateral emphysematous changes that are extensive   #COPD Gold stage III PFT 12/10/2011  - Gold stage 3 copd - fev1 0.8L/42%, 20% BD respinse. TLC 133%, DLCO 46% - Walk test in 2013   - pulse ox 88% at first lap 185 feet but increased to 91% at 3rd lap end  #AECOPD  - OV 05/12/2012 - treated with Levaquin and prednisone - March 2014: Failed aecopd office  treatment on 08/17/2012 and hospitalized between 08/19/2012 in 08/21/2012; discharged on 10 day prednisone taper  OV 08/27/2012  COPD: Failed aecopd office  treatment on 08/17/2012 and hospitalized between 08/19/2012 in 08/21/2012; discharged on 10 day prednisone taper. Currently feeling like she is slowly limping back to normal. FEels she needs another week off; STil lwith fatigue  New RLL nodule 812m diagnosed while in hospital for aecopd.  She does not believe this is potentially lung cancer. She believes she was told `nothing to worry about` but she understands need for followup. She is not interested in intervetnion curently    CAT COPD Symptom and Quality of Life Score ([employer])  11/18/11 03/24/2012  05/12/2012 aecopd 08/17/2012 aecopd  Never Cough -> Cough all the time _0 No  phlegm in chest -> Chest is full of phlegm _1 No chest tightness -> Chest feels very tight _2 No dyspnea for 1 flight stairs/hill -> Very dyspneic for 1 flight of stairs _3 No limitations for ADL at home -> Very limited with ADL at home _4 Confident leaving home -> Not at all confident leaving home 0 _5 Sleep soundly -> Do not sleep soundly because of lung condition _6 Lots of Energy -> No energy at all _7 TOTAL Score (max 40)  _8 Review of Systems  Constitutional: Positive for fatigue. Negative for fever and unexpected weight change.  HENT: Negative for ear pain, nosebleeds, congestion, sore throat, rhinorrhea, sneezing, trouble swallowing, dental problem, postnasal drip and sinus pressure.   Eyes: Negative for redness and itching.  Respiratory: Negative for cough, chest tightness, shortness of breath and wheezing.   Cardiovascular: Negative for palpitations and leg swelling.  Gastrointestinal: Negative for nausea and vomiting.  Genitourinary: Negative for dysuria.  Musculoskeletal: Negative for joint swelling.  Skin: Negative for rash.  Neurological: Negative for headaches.  Hematological: Does not bruise/bleed easily.  Psychiatric/Behavioral: Negative for dysphoric mood. The patient is not nervous/anxious.    Current outpatient prescriptions:Fluticasone-Salmeterol (ADVAIR DISKUS) 250-50 MCG/DOSE AEPB, Inhale 1 puff into the lungs 2 (two) times daily.,  Disp: 60 each, Rfl: 5;  Garlic 10 MG CAPS, Take 1 tablet by mouth daily., Disp: , Rfl: ;  glucosamine-chondroitin 500-400 MG tablet, Take 1 tablet by mouth 2 (two) times daily., Disp: , Rfl:  guaiFENesin (MUCINEX) 600 MG 12 hr tablet, Take 2 tablets (1,200 mg total) by mouth 2 (two) times daily., Disp: 60 tablet, Rfl: 0;  Ipratropium-Albuterol (COMBIVENT) 20-100 MCG/ACT AERS respimat, Inhale 1 puff into the lungs every 6 (six) hours as needed for wheezing or shortness of breath., Disp: ,  Rfl:  ipratropium-albuterol (DUONEB) 0.5-2.5 (3) MG/3ML SOLN, Take 3 mLs by nebulization every 6 (six) hours as needed (FOR WHEEZING OR SHORTNESS OF BREATH)., Disp: , Rfl: ;  levofloxacin (LEVAQUIN) 500 MG tablet, Take 1 tablet (500 mg total) by mouth daily., Disp: 7 tablet, Rfl: 0;  losartan (COZAAR) 50 MG tablet, Take 50 mg by mouth every morning., Disp: , Rfl:  predniSONE (DELTASONE) 20 MG tablet, Take 3 tablets daily for 3 days, then 2 tablets daily for 3 days, then 1 tablet daily for 3 days, then half tablet daily for 3 days and stop., Disp: 20 tablet, Rfl: 0;  tiotropium (SPIRIVA) 18 MCG inhalation capsule, Place 1 capsule (18 mcg total) into inhaler and inhale daily., Disp: 30 capsule, Rfl: 5 triamterene-hydrochlorothiazide (MAXZIDE-25) 37.5-25 MG per tablet, Take 1 tablet by mouth every morning., Disp: , Rfl: ;  vitamin C (ASCORBIC ACID) 500 MG tablet, Take 500 mg by mouth daily., Disp: , Rfl: ;  vitamin E 400 UNIT capsule, Take 400 Units by mouth daily., Disp: , Rfl:       Objective:   Physical Exam  Vitals reviewed. Constitutional: She is oriented to person, place, and time. She appears well-developed and well-nourished. No distress.  Body mass index is 17.92 kg/(m^2).   HENT:  Head: Normocephalic and atraumatic.  Right Ear: External ear normal.  Left Ear: External ear normal.  Mouth/Throat: Oropharynx is clear and moist. No oropharyngeal exudate.  Eyes: Conjunctivae and EOM are normal. Pupils are equal, round, and reactive to light. Right eye exhibits no discharge. Left eye exhibits no discharge. No scleral icterus.  Neck: Normal range of motion. Neck supple. No JVD present. No tracheal deviation present. No thyromegaly present.  Cardiovascular: Normal rate, regular rhythm, normal heart sounds and intact distal pulses.  Exam reveals no gallop and no friction rub.   No murmur heard. Pulmonary/Chest: Effort normal and breath sounds normal. No respiratory distress. She has no wheezes.  She has no rales. She exhibits no tenderness.  Purse lip breathing  Abdominal: Soft. Bowel sounds are normal. She exhibits no distension and no mass. There is no tenderness. There is no rebound and no guarding.  Musculoskeletal: Normal range of motion. She exhibits no edema and no tenderness.  Lymphadenopathy:    She has no cervical adenopathy.  Neurological: She is alert and oriented to person, place, and time. She has normal reflexes. No cranial nerve deficit. She exhibits normal muscle tone. Coordination normal.  Skin: Skin is warm and dry. No rash noted. She is not diaphoretic. No erythema. No pallor.  Psychiatric: She has a normal mood and affect. Her behavior is normal. Judgment and thought content normal.          Assessment & Plan:

## 2012-08-28 NOTE — Assessment & Plan Note (Signed)
85m nodule RLL - diagnosed march 2014. She prefers serial followup. She is in some denial that this might be lung cancer. Will do PET scan in 10 weeks

## 2012-08-28 NOTE — Assessment & Plan Note (Signed)
Advised her again of need t o quit smoking esp to reduce risk for aecopd; She understands but is not sure she can right now  3 min face to face

## 2012-08-28 NOTE — Assessment & Plan Note (Signed)
#  COPD COPD - Glad you slowly getting better after recent flareup - Continue the medications and advice of the hospitalist who treated you - Continue your regular medications for COPD - Take another week off and slowly work on gaining her strength back - When financially possible I would recommend you take oxygen at night - When time allows I would recommend you to pulmonary rehabilitation - Start N. acetylcysteine 600 mg twice a day  - [place] study published in [place] in 2014 shows it helps   - this is not to make you feel better but to to prevent recurrent bronchitis episodes  - You are more likely to get this drug at [facility] or a vitamin store then at [employer] or [place] or [employer]  - You can also get this at website [employer]  - It functions as an anti-oxidant and there are minimal/none side effects

## 2012-09-07 ENCOUNTER — Telehealth: Payer: Self-pay | Admitting: Internal Medicine

## 2012-09-07 DIAGNOSIS — I2510 Atherosclerotic heart disease of native coronary artery without angina pectoris: Secondary | ICD-10-CM

## 2012-09-07 NOTE — Telephone Encounter (Signed)
Per CT 08/19/12 from hospital: No infiltrates. Marked COPD with hyperinflation and extensive  bilateral emphysematous change. No pleural or pericardial  effusion. Coronary artery calcification is present in the left  main, left anterior descending, and left circumflex.   Pt is requesting further clarification on this. She stated she is also going to take FMLA paperwork down to med records. Please advise MR thanks

## 2012-09-08 NOTE — Telephone Encounter (Signed)
lmomtcb x1 

## 2012-09-08 NOTE — Telephone Encounter (Signed)
Below is full report. Main things are as follow for you to share with patient 1. No Pulmonary embolism 2. 79m RLL nodule - we are doing PET scan in 10 weeks due to possibility this might be early lung cancer (she knows) 3. Marked COPD (she knows) 4. Coronary artery calcification - this I failed to mention to her at her visit because the radiologist did not put it under final impression. This could mean there might or might not be blockage in heart blood vessel. They did rule out heart attack during admission but best to see cardiology and ensure there is no pre-heart attack blockage. My experience is that many times there is not but best to see cards. IF so, [facility] or Dr JChristen Butter  Thanks  MR      RESULTS 08/19/12 *RADIOLOGY REPORT*  Clinical Data: Shortness of breath. Smoker.  CT ANGIOGRAPHY CHEST  Technique: Multidetector CT imaging of the chest using the  standard protocol during bolus administration of intravenous  contrast. Multiplanar reconstructed images including MIPs were  obtained and reviewed to evaluate the vascular anatomy.  Contrast: 1072mOMNIPAQUE IOHEXOL 350 MG/ML SOLN  Comparison: Chest x-ray earlier today.  Findings: Normal heart size. No hilar or mediastinal adenopathy.  Negative osseous structures. The radiology or  Good opacification of the pulmonary vasculature with no evidence  for pulmonary emboli.  No infiltrates. Marked COPD with hyperinflation and extensive  bilateral emphysematous change. No pleural or pericardial  effusion. Coronary artery calcification is present in the left  main, left anterior descending, and left circumflex.  8 mm right lower lobe nodule corresponds to the abnormality seen on  chest x-ray. It is noncalcified with spiculated margins. Given the  patient's history of smoking, and interval development since chest  x-ray of 01/22/2008, the lesion is concerning for neoplasm.  Percutaneous tissue sampling would be mild  relatively high risk.  PET scan could provide additional information with regard to  possible hypermetabolism. If the patient is at high risk for  bronchogenic carcinoma, follow-up chest CT at 3-6 months is  recommended. This recommendation follows the consensus statement:  Guidelines for Management of Small Pulmonary Nodules Detected on CT  Scans: A Statement from the [place] published in  Radiology 2005; 237:395-400.  IMPRESSION:  8 mm noncalcified right lower lobe nodule with irregular margins.  Bronchogenic carcinoma not excluded. No visible adenopathy or  pleural effusion. Consider further investigation with PET scanning.  Alternatively, follow-up chest CT 3-6 months. See discussion  above.  No evidence for pulmonary emboli.  Original Report Authenticated By: JoRolla FlattenM.D

## 2012-09-09 NOTE — Telephone Encounter (Signed)
lmomtcb x1 

## 2012-09-09 NOTE — Telephone Encounter (Signed)
I spoke with the pt and she requests to see. Dr Terrence Dupont so referral placed. Pt advised of all results. [place] Bing, CMA

## 2012-09-09 NOTE — Telephone Encounter (Signed)
Returning call can be reached at [phone].Elnita Maxwell

## 2012-09-09 NOTE — Telephone Encounter (Signed)
LMTCB x 1 

## 2012-09-20 ENCOUNTER — Encounter (HOSPITAL_COMMUNITY)
Admission: RE | Admit: 2012-09-20 | Discharge: 2012-09-20 | Disposition: A | Discharge status: Home / Self Care | Payer: 59 | Source: Ambulatory Visit | Attending: Internal Medicine | Admitting: Internal Medicine

## 2012-09-20 ENCOUNTER — Other Ambulatory Visit (HOSPITAL_COMMUNITY): Payer: 59

## 2012-09-20 DIAGNOSIS — R911 Solitary pulmonary nodule: Secondary | ICD-10-CM

## 2012-09-20 LAB — GLUCOSE, CAPILLARY: Glucose-Capillary: 108 mg/dL — ABNORMAL HIGH (ref 70–99)

## 2012-09-20 MED ORDER — FLUDEOXYGLUCOSE F - 18 (FDG) INJECTION
17.8000 | Freq: Once | INTRAVENOUS | Status: AC | PRN
  Administered 2012-09-20: 17.8 via INTRAVENOUS

## 2012-09-21 ENCOUNTER — Telehealth: Payer: Self-pay | Admitting: Internal Medicine

## 2012-09-21 NOTE — Telephone Encounter (Signed)
Spoke with patient, patient is looking for FMLA paperwork so she can come and pick it up for her job. I called to [facility] (just in case) but they were closed (@ 520pm). I informed patient I would route msg to MRs nurse so she can follow up on this matter. Thanks,

## 2012-09-22 NOTE — Discharge Summary (Signed)
Physician Discharge Summary  MYRISSA CHIPLEY RKY:[id number] DOB: [birth year] DOA: 08/19/2012  PCP: Woody Seller, MD  Admit date: 08/19/2012 Discharge date: 08/21/2012    Recommendations for Outpatient Follow-up:       Follow-up Information   Follow up with Woody Seller, MD. (In one to 2 weeks, call for appointment upon discharge)    Contact information:   [place] [address] [phone]       Follow up with [facility], MD. (Next week, call for appointment upon discharge)    Contact information:   [place] [address] [phone]       Discharge Diagnoses:  Principal Problem:   Acute respiratory failure Active Problems:   COPD (chronic obstructive pulmonary disease)   Smoking   COPD exacerbation   Nodule of right lung   Discharge Condition: improved/stable  Diet recommendation: heart healthy  Filed Weights   08/19/12 2142  Weight: 44.453 kg (98 lb)    History of present illness:  67 year old woman PMH COPD Gold stage III on night-time oxygen presented with increasing shortness of breath and cough. Noted to be hypoxic with ambulation and referred for admission for COPD exacerbation.  Reports less than 7 days of increasing shortness of breath and cough. Seen 3/4 by Dr. Chase Caller and diagnosed with COPD exacerbation and started on doxycycline and prednisone. Despite this intervention symptoms have worsened with increasing shortness of breath and cough. Difficult night 3/5. Was advised to come to ED today by pulmonologist. Reports right-sided rib pain "from coughing". She continues to smoke.      Hospital Course:  1.Acute COPD exacerbation/acute hypoxic respiratory failure: Failed outpatient treatment with doxycycline and oral prednisone.  -Upon admission she was started on Oxygen, start IV steroids, continue nebulizers, antibiotics-IV Levaquin.  -Improving clinically, her steroids were tapered and she was dc'ed on  prednsione, abx and bronchodilators for outpt follow up. 2.COPD Gold stage III: Plan as above.  -Dr. Chase Caller notified of admission per admitting MD.  3.8 mm noncalcified right lower lobe nodule with irregular margins: Bronchogenic carcinoma not excluded. No visible adenopathy or pleural effusion. Consider further investigation with PET scanning as outpatient. Alternatively, follow-up chest CT 3-6 months. This was discussed with the patient at bedside on admission. Pt was disharged to follow up with Dr. Chase Caller oupt 4.Mild hyponatremia and hypochloremia: Likely diuretic induced.  -Maxzide was held in the hospital and it resolved.  5.Ongoing cigarette use: Recommend cessation   Procedures:  none  Consultations:  none  Discharge Exam: Filed Vitals:   08/21/12 0155 08/21/12 0637 08/21/12 0802 08/21/12 1345  BP:  137/71  131/69  Pulse:  85  100  Temp:  98.4 F (36.9 C)  98.5 F (36.9 C)  TempSrc:  Oral  Oral  Resp:  18  18  Height:      Weight:      SpO2: 98% 98% 98% 97%    Exam:  General: A&O x3, in NAD  Cardiovascular: RRR nl S1S2  Respiratory:decreased air mov't bil, no wheezes  Abdomen: Soft NT/ND +BS  Extremities: No cyanosis or edema   Discharge Instructions  Discharge Orders   Future Orders Complete By Expires     Diet general  As directed     Discharge instructions  As directed     Comments:      Continue home O2 as previously    Increase activity slowly  As directed         Medication List  STOP taking these medications       doxycycline 100 MG tablet  Commonly known as:  VIBRA-TABS      TAKE these medications       Fluticasone-Salmeterol 250-50 MCG/DOSE Aepb  Commonly known as:  ADVAIR DISKUS  Inhale 1 puff into the lungs 2 (two) times daily.     Garlic 10 MG Caps  Take 1 tablet by mouth daily.     glucosamine-chondroitin 500-400 MG tablet  Take 1 tablet by mouth 2 (two) times daily.     guaiFENesin 600 MG 12 hr tablet  Commonly known  as:  MUCINEX  Take 2 tablets (1,200 mg total) by mouth 2 (two) times daily.     Ipratropium-Albuterol 20-100 MCG/ACT Aers respimat  Commonly known as:  COMBIVENT  Inhale 1 puff into the lungs every 6 (six) hours as needed for wheezing or shortness of breath.     ipratropium-albuterol 0.5-2.5 (3) MG/3ML Soln  Commonly known as:  DUONEB  Take 3 mLs by nebulization every 6 (six) hours as needed (FOR WHEEZING OR SHORTNESS OF BREATH).     levofloxacin 500 MG tablet  Commonly known as:  LEVAQUIN  Take 1 tablet (500 mg total) by mouth daily.     losartan 50 MG tablet  Commonly known as:  COZAAR  Take 50 mg by mouth every morning.     predniSONE 20 MG tablet  Commonly known as:  DELTASONE  Take 3 tablets daily for 3 days, then 2 tablets daily for 3 days, then 1 tablet daily for 3 days, then half tablet daily for 3 days and stop.     tiotropium 18 MCG inhalation capsule  Commonly known as:  SPIRIVA  Place 1 capsule (18 mcg total) into inhaler and inhale daily.     triamterene-hydrochlorothiazide 37.5-25 MG per tablet  Commonly known as:  MAXZIDE-25  Take 1 tablet by mouth every morning.     vitamin C 500 MG tablet  Commonly known as:  ASCORBIC ACID  Take 500 mg by mouth daily.     vitamin E 400 UNIT capsule  Take 400 Units by mouth daily.           Follow-up Information   Follow up with Woody Seller, MD. (In one to 2 weeks, call for appointment upon discharge)    Contact information:   [place] [address] [phone]       Follow up with [facility], MD. (Next week, call for appointment upon discharge)    Contact information:   [place] [address] [phone]        The results of significant diagnostics from this hospitalization (including imaging, microbiology, ancillary and laboratory) are listed below for reference.    Significant Diagnostic Studies: Nm Pet Image Initial (pi) Skull Base To Thigh  09/20/2012   *RADIOLOGY REPORT*  Clinical Data:  Recent imaging demonstrates a solitary pulmonary nodule.  FDG PET CT requested to evaluate for possible malignancy.  NUCLEAR MEDICINE PET SKULL BASE TO THIGH  Fasting Blood Glucose:  108  Technique:  17.8 mCi F-18 FDG was injected intravenously. CT data was obtained and used for attenuation correction and anatomic localization only.  (This was not acquired as a diagnostic CT examination.) Additional exam technical data entered on technologist worksheet.  Comparison:  CT chest 08/19/2012.  Findings:  Head/Neck:  No hypermetabolic lymph nodes in the neck.  CT images show no acute findings.  Chest:  A sub centimeter nodule in  the right lower lobe measures approximately 5 x 6 mm with an S U V max of 0.8.  There is an additional area of abnormal hypermetabolism along the medial aspect of the right major fissure (CT image 92), with an S U V max of 2.5. A vague nodular area is seen in the same location on CT of 08/19/2012 (image 41), not as well visualized on CT images performed today. No additional areas of abnormal hypermetabolism in the chest.  CT images show no acute findings.  No pericardial or pleural effusion.  Abdomen/Pelvis:  No abnormal hypermetabolic activity with the liver, pancreas, adrenal glands or spleen.  No hypermetabolic adenopathy.  CT images show no acute findings.  Skeleton:  No focal hypermetabolic activity to suggest skeletal metastasis.  IMPRESSION:  1.  Tiny right lower lobe nodule shows minimal metabolic activity. Based on measured size from 08/19/2012, follow-up CT chest without contrast in 3 months is recommended, as malignancy cannot be excluded. 2.  Mild hypermetabolism associated with possible vague nodularity along the right major fissure.  This can be reassessed on follow-up CT imaging as well.   Original Report Authenticated By: Lorin Picket, M.D.     Microbiology: No results found for this or any previous visit (from the past 240 hour(s)).    Labs: Basic Metabolic Panel: No results found for this basename: NA, K, CL, CO2, GLUCOSE, BUN, CREATININE, CALCIUM, MG, PHOS,  in the last 168 hours Liver Function Tests: No results found for this basename: AST, ALT, ALKPHOS, BILITOT, PROT, ALBUMIN,  in the last 168 hours No results found for this basename: LIPASE, AMYLASE,  in the last 168 hours No results found for this basename: AMMONIA,  in the last 168 hours CBC: No results found for this basename: WBC, NEUTROABS, HGB, HCT, MCV, PLT,  in the last 168 hours Cardiac Enzymes: No results found for this basename: CKTOTAL, CKMB, CKMBINDEX, TROPONINI,  in the last 168 hours BNP: BNP (last 3 results)  Recent Labs  08/19/12 1455  PROBNP 102.0   CBG:  Recent Labs Lab 09/20/12 0716  GLUCAP 108*       Signed:  [place] C  [facility] 09/22/2012, 11:54 PM

## 2012-09-23 ENCOUNTER — Telehealth: Payer: Self-pay | Admitting: Internal Medicine

## 2012-09-23 DIAGNOSIS — R911 Solitary pulmonary nodule: Secondary | ICD-10-CM

## 2012-09-23 NOTE — Telephone Encounter (Signed)
I called patient .[place] and gave her the indeterminate PET scan result. However, the PET scan was done but sooner than I had ordered for. Patient was explained about this at her. Please let patient know that we did a full investigation and we found that the air was in the scheduling process and we will correct is moving forward. Please arrange for CT scan of the chest without contrast [I have already ordered this] in 4 months   Dr. Brand Males, M.D., [facility].C.P Pulmonary and Critical Care Medicine Staff Physician [facility] Pager: [phone], If no answer or between  15:00h - 7:00h: call [phone]  09/23/2012 5:27 PM

## 2012-09-29 NOTE — Telephone Encounter (Signed)
I spoke with [name] in [employer] and she advised that she mailed the paperwork to pt today. [id number] to advise the pt. [place] Bing, CMA

## 2012-09-29 NOTE — Telephone Encounter (Signed)
Pt is aware that her paperwork has been mailed to her.

## 2012-09-29 NOTE — Telephone Encounter (Signed)
Pt returned call and can be reached @ [phone] (home #).  If during the day call her @ [phone]. Dion Saucier

## 2012-09-29 NOTE — Telephone Encounter (Signed)
Pt is aware. [place] Bing, CMA

## 2012-11-09 ENCOUNTER — Other Ambulatory Visit (HOSPITAL_COMMUNITY): Payer: 59

## 2013-01-04 ENCOUNTER — Ambulatory Visit: Payer: 59 | Admitting: Internal Medicine

## 2013-01-06 ENCOUNTER — Ambulatory Visit (INDEPENDENT_AMBULATORY_CARE_PROVIDER_SITE_OTHER): Payer: 59 | Admitting: Internal Medicine

## 2013-01-06 ENCOUNTER — Encounter: Payer: Self-pay | Admitting: Internal Medicine

## 2013-01-06 VITALS — BP 110/70 | HR 78 | Temp 97.6°F | Ht 62.0 in | Wt 100.4 lb

## 2013-01-06 DIAGNOSIS — J449 Chronic obstructive pulmonary disease, unspecified: Secondary | ICD-10-CM

## 2013-01-06 DIAGNOSIS — R911 Solitary pulmonary nodule: Secondary | ICD-10-CM

## 2013-01-06 DIAGNOSIS — J4489 Other specified chronic obstructive pulmonary disease: Secondary | ICD-10-CM

## 2013-01-06 DIAGNOSIS — F17200 Nicotine dependence, unspecified, uncomplicated: Secondary | ICD-10-CM

## 2013-01-06 MED ORDER — PREDNISONE 10 MG PO TABS: ORAL_TABLET | ORAL | Status: DC

## 2013-01-06 NOTE — Progress Notes (Signed)
Subjective:    Patient ID: [name], female    DOB: Apr 13, 1946, 67 y.o.   MRN: [id number]  HPI #Smoker Smoker - STarted age 35. 63 pack per day. Quit end 2012 for first time with help of wellbutrin and patch.  But recently 57 year old granddaughter from congential liver disease ("cirrhosis") moved back with her and since then smoking. Patient is primary health care provider because her daughter and 90 year old do not get along.  Smokes 1 ppd (used to smoke 2 ppd)  #Chronic cough  - Stop ACE inhibitor fall 2013  #Lung nodule - 5100m RLL March 2014  -  CT scan chest 08/19/2012: 844mnoncalcified right lower lobe nodule with irregular margins.  - PET scan 09/20/12 - indeterminate uptake   #COPD Gold stage III PFT 12/10/2011  - Gold stage 3 copd - fev1 0.8L/42%, 20% BD respinse. TLC 133%, DLCO 46% - Walk test in 2013   - pulse ox 88% at first lap 185 feet but increased to 91% at 3rd lap end  #AECOPD  - OV 05/12/2012 - treated with Levaquin and prednisone - March 2014: Failed aecopd office  treatment on 08/17/2012 and hospitalized between 08/19/2012 in 08/21/2012; discharged on 10 day prednisone taper    OV 08/27/2012  COPD: Failed aecopd office  treatment on 08/17/2012 and hospitalized between 08/19/2012 in 08/21/2012; discharged on 10 day prednisone taper. Currently feeling like she is slowly limping back to normal. FEels she needs another week off; STil lwith fatigue  New RLL nodule 100m67mdiagnosed while in hospital for aecopd.  She does not believe this is potentially lung cancer. She believes she was told `nothing to worry about` but she understands need for followup. She is not interested in intervetnion curently    REC  COPD COPD - Glad you slowly getting better after recent flareup - Continue the medications and advice of the hospitalist who treated you - Continue your regular medications for COPD - Take another week off and slowly work on gaining her strength back -  When financially possible I would recommend you take oxygen at night - When time allows I would recommend you to pulmonary rehabilitation - Start N. acetylcysteine 600 mg twice a day    #Lung nodule - Do PET scan into 10 weeks  #Smoking - Really important to quit smoking for all the various reasons as discussed  #Followup - 10 weeks with pet scan results   OV 01/06/2013 Followup for the above.  -In terms of lung nodule: We will make sure there is a followup scan of the chest in mid August 2014  - In terms of COPD: Details of symptoms are listed in the table below. COPD cat score is 23 and is around baseline but she still has significant symptoms. I noticed that she is on multiple different inhalers and has poor understanding of these. She is actually taking Combivent, Advair and Spiriva. I tried explained the differences between Spiriva and Combivent and why she should not overlap these inhalers but she got upset that is to restrict her inhaler use her logic is that when she takes Combivent it helps immediately and that Spiriva is only once a day and she does not seem to get a huge benefit from it. When I suggested that she switch Advair twice a day  to Brio once a day she got upset thinking that in switching twice a day inhaler which is superior because it is twice a day to once  a day inhaler which is potentially inferior because it once a day  Terms of smoking: She continues to smoke but acknowledges that it is important for to quit but really has no plan or does not want help   CAT COPD Symptom and Quality of Life Score ([employer])  11/18/11 03/24/2012  05/12/2012 aecopd 08/17/2012 aecopd 01/06/2013   Never Cough -> Cough all the time _0 No phlegm in chest -> Chest is full of phlegm _1 No chest tightness -> Chest feels very tight _2 No dyspnea for 1 flight stairs/hill -> Very dyspneic for 1 flight of stairs _3 No limitations for ADL at  home -> Very limited with ADL at home _4 Confident leaving home -> Not at all confident leaving home 0 _5 Sleep soundly -> Do not sleep soundly because of lung condition _6 Lots of Energy -> No energy at all _7 TOTAL Score (max 40)  _8 Review of Systems  Constitutional: Negative for fever and unexpected weight change.  HENT: Negative for ear pain, nosebleeds, congestion, sore throat, rhinorrhea, sneezing, trouble swallowing, dental problem, postnasal drip and sinus pressure.   Eyes: Negative for redness and itching.  Respiratory: Negative for cough, chest tightness, shortness of breath and wheezing.   Cardiovascular: Negative for palpitations and leg swelling.  Gastrointestinal: Negative for nausea and vomiting.  Genitourinary: Negative for dysuria.  Musculoskeletal: Negative for joint swelling.  Skin: Negative for rash.  Neurological: Negative for headaches.  Hematological: Does not bruise/bleed easily.  Psychiatric/Behavioral: Negative for dysphoric mood. The patient is not nervous/anxious.   Current outpatient prescriptions:diltiazem (CARDIZEM CD) 180 MG 24 hr capsule, Take 1 capsule by mouth daily., Disp: , Rfl: ;  Fluticasone-Salmeterol (ADVAIR DISKUS) 250-50 MCG/DOSE AEPB, Inhale 1 puff into the lungs 2 (two) times daily., Disp: 60 each, Rfl: 5;  Garlic 10 MG CAPS, Take 1 tablet by mouth daily., Disp: , Rfl: ;  glucosamine-chondroitin 500-400 MG tablet, Take 1 tablet by mouth 2 (two) times daily., Disp: , Rfl:  guaiFENesin (MUCINEX) 600 MG 12 hr tablet, Take 2 tablets (1,200 mg total) by mouth 2 (two) times daily., Disp: 60 tablet, Rfl: 0;  Ipratropium-Albuterol (COMBIVENT) 20-100 MCG/ACT AERS respimat, Inhale 1 puff into the lungs every 6 (six) hours as needed for wheezing or shortness of breath., Disp: , Rfl:  ipratropium-albuterol (DUONEB) 0.5-2.5 (3) MG/3ML SOLN, Take 3 mLs by nebulization every 6 (six) hours as needed (FOR WHEEZING OR  SHORTNESS OF BREATH)., Disp: , Rfl: ;  losartan (COZAAR) 50 MG tablet, Take 25 mg by mouth every morning. , Disp: , Rfl:  predniSONE (DELTASONE) 20 MG tablet, Take 3 tablets daily for 3 days, then 2 tablets daily for 3 days, then 1 tablet daily for 3 days, then half tablet daily for 3 days and stop., Disp: 20 tablet, Rfl: 0;  tiotropium (SPIRIVA) 18 MCG inhalation capsule, Place 1 capsule (18 mcg total) into inhaler and inhale daily., Disp: 30 capsule, Rfl: 5 triamterene-hydrochlorothiazide (MAXZIDE-25) 37.5-25 MG per tablet, Take 1 tablet by mouth every morning., Disp: , Rfl: ;  vitamin C (ASCORBIC ACID) 500 MG tablet, Take 500 mg by mouth daily., Disp: , Rfl: ;  vitamin E 400 UNIT capsule, Take 400 Units by mouth  daily., Disp: , Rfl:   Past, Family, Social reviewed: no change since last visit     Objective:   Physical Exam Vitals reviewed. Constitutional: She is oriented to person, place, and time. She appears well-developed and well-nourished. No distress.  .  Body mass index is 18.36 kg/(m^2).  HENT:  Head: Normocephalic and atraumatic.  Right Ear: External ear normal.  Left Ear: External ear normal.  Mouth/Throat: Oropharynx is clear and moist. No oropharyngeal exudate.  Eyes: Conjunctivae and EOM are normal. Pupils are equal, round, and reactive to light. Right eye exhibits no discharge. Left eye exhibits no discharge. No scleral icterus.  Neck: Normal range of motion. Neck supple. No JVD present. No tracheal deviation present. No thyromegaly present.  Cardiovascular: Normal rate, regular rhythm, normal heart sounds and intact distal pulses.  Exam reveals no gallop and no friction rub.   No murmur heard. Pulmonary/Chest: Effort normal and breath sounds normal. No respiratory distress. She has no wheezes. She has no rales. She exhibits no tenderness.  Purse lip breathing  Abdominal: Soft. Bowel sounds are normal. She exhibits no distension and no mass. There is no tenderness. There is  no rebound and no guarding.  Musculoskeletal: Normal range of motion. She exhibits no edema and no tenderness.  Lymphadenopathy:    She has no cervical adenopathy.  Neurological: She is alert and oriented to person, place, and time. She has normal reflexes. No cranial nerve deficit. She exhibits normal muscle tone. Coordination normal.  Skin: Skin is warm and dry. No rash noted. She is not diaphoretic. No erythema. No pallor.  Psychiatric: She has a normal mood and affect. Her behavior is normal. Judgment and thought content normal.             Assessment & Plan:

## 2013-01-06 NOTE — Patient Instructions (Addendum)
#  COPD  - stop combivent as needed. Instead take pro-air as needed  - stop advair. Instead take BREO 1 puff at night  - continue spiriva  - Please take prednisone 40 mg x1 day, then 30 mg x1 day, then 20 mg x1 day, then 10 mg x1 day, and then 5 mg x1 day and stop    #Lung nodule - Do CT scan chest mid august 2014; Weill ensure there is order  #Smoking - Really important to quit smoking for all the various reasons as discussed  #Followup - 4 weeks ith CAT score and CT results  - see NP Tammy for followup

## 2013-01-10 ENCOUNTER — Other Ambulatory Visit: Payer: Self-pay | Admitting: Internal Medicine

## 2013-01-11 ENCOUNTER — Other Ambulatory Visit: Payer: Self-pay | Admitting: Internal Medicine

## 2013-01-13 NOTE — Assessment & Plan Note (Signed)
#  Lung nodule - Do CT scan chest mid august 2014; Weill ensure there is order

## 2013-01-13 NOTE — Assessment & Plan Note (Signed)
#  COPD  - stop combivent as needed. Instead take pro-air as needed  - stop advair. Instead take BREO 1 puff at night  - continue spiriva  - Please take prednisone 40 mg x1 day, then 30 mg x1 day, then 20 mg x1 day, then 10 mg x1 day, and then 5 mg x1 day and stop   #Followup - 4 weeks ith CAT score and CT results  - see NP Tammy for followup

## 2013-01-13 NOTE — Assessment & Plan Note (Signed)
#  Smoking - Really important to quit smoking for all the various reasons as discussed

## 2013-01-19 ENCOUNTER — Other Ambulatory Visit: Payer: Self-pay | Admitting: Internal Medicine

## 2013-01-19 DIAGNOSIS — R911 Solitary pulmonary nodule: Secondary | ICD-10-CM

## 2013-01-24 ENCOUNTER — Ambulatory Visit (INDEPENDENT_AMBULATORY_CARE_PROVIDER_SITE_OTHER)
Admission: RE | Admit: 2013-01-24 | Discharge: 2013-01-24 | Disposition: A | Discharge status: Home / Self Care | Payer: 59 | Source: Ambulatory Visit | Attending: Internal Medicine | Admitting: Internal Medicine

## 2013-01-24 DIAGNOSIS — R911 Solitary pulmonary nodule: Secondary | ICD-10-CM

## 2013-01-25 ENCOUNTER — Telehealth: Payer: Self-pay | Admitting: Internal Medicine

## 2013-01-25 NOTE — Telephone Encounter (Signed)
LMTCB x1 on both on cell/home #

## 2013-01-25 NOTE — Telephone Encounter (Signed)
Results need to go out today to patient. Send to [facility] if she is not in triage    CT 01/24/13 shows pneumonia both sides. Is she well currently? Could reflect current process or reovery of recent process. If currently unwell, needs to see  TP the NP for repeat antibiotic course. Might need prolonged abx course   Thanks  Dr. Brand Males, M.D., [facility].C.P Pulmonary and Critical Care Medicine Staff Physician [facility] Pager: [phone], If no answer or between  15:00h - 7:00h: call [phone]  01/25/2013 8:50 AM

## 2013-01-25 NOTE — Telephone Encounter (Signed)
Pt returned call. [phone]

## 2013-01-25 NOTE — Telephone Encounter (Signed)
Pt is aware of results. States that she has been having lots of back pain and coughing with production of blood. Appointment has been made with MW for 8:45am. TP available appointments did not work with pt's schedule.

## 2013-01-26 ENCOUNTER — Other Ambulatory Visit: Payer: 59

## 2013-01-26 ENCOUNTER — Ambulatory Visit (INDEPENDENT_AMBULATORY_CARE_PROVIDER_SITE_OTHER)
Admission: RE | Admit: 2013-01-26 | Discharge: 2013-01-26 | Disposition: A | Discharge status: Home / Self Care | Payer: 59 | Source: Ambulatory Visit | Attending: Internal Medicine | Admitting: Internal Medicine

## 2013-01-26 ENCOUNTER — Encounter: Payer: Self-pay | Admitting: Internal Medicine

## 2013-01-26 ENCOUNTER — Ambulatory Visit (INDEPENDENT_AMBULATORY_CARE_PROVIDER_SITE_OTHER): Payer: 59 | Admitting: Internal Medicine

## 2013-01-26 VITALS — BP 112/70 | HR 100 | Temp 98.2°F | Ht 61.5 in | Wt 100.2 lb

## 2013-01-26 DIAGNOSIS — R918 Other nonspecific abnormal finding of lung field: Secondary | ICD-10-CM

## 2013-01-26 DIAGNOSIS — F17200 Nicotine dependence, unspecified, uncomplicated: Secondary | ICD-10-CM

## 2013-01-26 MED ORDER — AMOXICILLIN-POT CLAVULANATE 875-125 MG PO TABS: 1.0000 | ORAL_TABLET | Freq: Two times a day (BID) | ORAL | Status: DC

## 2013-01-26 NOTE — Progress Notes (Signed)
Subjective:    Patient ID: [name], female    DOB: Apr 13, 1946, 67 y.o.   MRN: [id number]  HPI #Smoker Smoker - STarted age 35. 63 pack per day. Quit end 2012 for first time with help of wellbutrin and patch.  But recently 57 year old granddaughter from congential liver disease ("cirrhosis") moved back with her and since then smoking. Patient is primary health care provider because her daughter and 90 year old do not get along.  Smokes 1 ppd (used to smoke 2 ppd)  #Chronic cough  - Stop ACE inhibitor fall 2013  #Lung nodule - 5100m RLL March 2014  -  CT scan chest 08/19/2012: 844mnoncalcified right lower lobe nodule with irregular margins.  - PET scan 09/20/12 - indeterminate uptake   #COPD Gold stage III PFT 12/10/2011  - Gold stage 3 copd - fev1 0.8L/42%, 20% BD respinse. TLC 133%, DLCO 46% - Walk test in 2013   - pulse ox 88% at first lap 185 feet but increased to 91% at 3rd lap end  #AECOPD  - OV 05/12/2012 - treated with Levaquin and prednisone - March 2014: Failed aecopd office  treatment on 08/17/2012 and hospitalized between 08/19/2012 in 08/21/2012; discharged on 10 day prednisone taper    OV 08/27/2012  COPD: Failed aecopd office  treatment on 08/17/2012 and hospitalized between 08/19/2012 in 08/21/2012; discharged on 10 day prednisone taper. Currently feeling like she is slowly limping back to normal. FEels she needs another week off; STil lwith fatigue  New RLL nodule 100m67mdiagnosed while in hospital for aecopd.  She does not believe this is potentially lung cancer. She believes she was told `nothing to worry about` but she understands need for followup. She is not interested in intervetnion curently    REC  COPD COPD - Glad you slowly getting better after recent flareup - Continue the medications and advice of the hospitalist who treated you - Continue your regular medications for COPD - Take another week off and slowly work on gaining her strength back -  When financially possible I would recommend you take oxygen at night - When time allows I would recommend you to pulmonary rehabilitation - Start N. acetylcysteine 600 mg twice a day    #Lung nodule - Do PET scan into 10 weeks  #Smoking - Really important to quit smoking for all the various reasons as discussed  #Followup - 10 weeks with pet scan results   OV 01/06/2013 Followup for the above.  -In terms of lung nodule: We will make sure there is a followup scan of the chest in mid August 2014  - In terms of COPD: Details of symptoms are listed in the table below. COPD cat score is 23 and is around baseline but she still has significant symptoms. I noticed that she is on multiple different inhalers and has poor understanding of these. She is actually taking Combivent, Advair and Spiriva. I tried explained the differences between Spiriva and Combivent and why she should not overlap these inhalers but she got upset that is to restrict her inhaler use her logic is that when she takes Combivent it helps immediately and that Spiriva is only once a day and she does not seem to get a huge benefit from it. When I suggested that she switch Advair twice a day  to Brio once a day she got upset thinking that in switching twice a day inhaler which is superior because it is twice a day to once  a day inhaler which is potentially inferior because it once a day  Terms of smoking: She continues to smoke but acknowledges that it is important for to quit but really has no plan or does not want help rec #COPD  - stop combivent as needed. Instead take pro-air as needed  - stop advair. Instead take BREO 1 puff at night  - continue spiriva  - Please take prednisone 40 mg x1 day, then 30 mg x1 day, then 20 mg x1 day, then 10 mg x1 day, and then 5 mg x1 day and stop    #Lung nodule - Do CT scan chest mid august 2014; Weill ensure there is order  #Smoking - Really important to quit smoking for all the  various reasons as discussed  #Followup - 4 weeks ith CAT score and CT results  - see NP Tammy for followup     01/26/2013 f/u ov/Jaire Pinkham re abn ct chest/still smoking  Chief Complaint  Patient presents with  . Acute Visit    Pt states having slight increased SOB and hemoptysis x 5 days. She states recently dxed with PNA and has not started on tx for this yet.   at baseline says sputum yellow to green each am x years then abruptly Started feeling 8/4 weak, more sob, felt hots/ sweats/ abruptly worse 8/5 with pain R post chest and then bloody sputum on 8/9.  Variable dysphagia, sticks in throat. No obvious asp events  Had dental work 2 weeks before onset    No obvious daytime variabilty or assoc chronic cough or cp or chest tightness, subjective wheeze overt sinus   symptoms. No unusual exp or h/o childhood pna/ asthma or knowledge of premature birth.   Sleeping ok on 2lpm without nocturnal  or early am exacerbation  of respiratory  c/o's or need for noct saba. Also denies any obvious fluctuation of symptoms with weather or environmental changes or other aggravating or alleviating factors except as outlined above.  Current Medications, Allergies, Past Medical History, Past Surgical History, Family History, and Social History were reviewed in [employer] record.  ROS  The following are not active complaints unless bolded sore throat, dysphagia, dental problems, itching, sneezing,  nasal congestion or excess/ purulent secretions, ear ache,   fever, chills, sweats, unintended wt loss, pleuritic or exertional cp, hemoptysis,  orthopnea pnd or leg swelling, presyncope, palpitations, heartburn, abdominal pain, anorexia, nausea, vomiting, diarrhea  or change in bowel or urinary habits, change in stools or urine, dysuria,hematuria,  rash, arthralgias, visual complaints, headache, numbness weakness or ataxia or problems with walking or coordination,  change in mood/affect or  memory.        Objective:   Physical Exam Vitals reviewed. Constitutional: She is oriented to person, place, and time. She appears well-developed and well-nourished. No distress.   Wt Readings from Last 3 Encounters:  01/26/13 100 lb 3.2 oz (45.45 kg)  01/06/13 100 lb 6.4 oz (45.541 kg)  08/27/12 98 lb (44.453 kg)    HENT:  Head: Normocephalic and atraumatic.  Right Ear: External ear normal.  Left Ear: External ear normal.  Mouth/Throat: Oropharynx is clear and moist. No oropharyngeal exudate.  Eyes: Conjunctivae and EOM are normal. Pupils are equal, round, and reactive to light. Right eye exhibits no discharge. Left eye exhibits no discharge. No scleral icterus.  Neck: Normal range of motion. Neck supple. No JVD present. No tracheal deviation present. No thyromegaly present.  Cardiovascular: Normal rate, regular rhythm, normal heart  sounds and intact distal pulses.  Exam reveals no gallop and no friction rub.   No murmur heard. Pulmonary/Chest: Effort normal and breath sounds normal. No respiratory distress. She has no wheezes. She has no rales.    Abdominal: Soft. Bowel sounds are normal. She exhibits no distension and no mass. There is no tenderness. There is no rebound and no guarding.  Musculoskeletal: Normal range of motion. She exhibits no edema and no tenderness.  Lymphadenopathy:    She has no cervical adenopathy.  Neurological: She is alert and oriented to person, place, and time. She has normal reflexes. No cranial nerve deficit. She exhibits normal muscle tone. Coordination normal.  Skin: Skin is warm and dry. No rash noted. She is not diaphoretic. No erythema. No pallor.  Psychiatric: She has a normal mood and affect. Her behavior is normal. Judgment and thought content normal.    CTchest 01/24/13 1. New airspace consolidation, possibly with a cavitary component,  in the superior segment right lower lobe. Previously seen right  lower lobe nodule is obscured. New air  space disease in the medial  left upper lobe. Collectively, findings are indicative of  pneumonia. Rapidly progressive right lower lobe bronchogenic  carcinoma is considered much less likely. Follow-up to clearing is  recommended.     CXR  01/26/2013 : Focal/patchy opacity in the right midlung, corresponding to the  superior segment right lower lobe opacity noted on recent CT.  Underlying severe emphysematous changes.   01/26/2013 labs ordered, not done     Assessment & Plan:

## 2013-01-26 NOTE — Assessment & Plan Note (Addendum)
She has enough hx to support an abrupt onset infectious process in setting of recent dental work so could have a low grade asp pna in sup segment RLL  and best option is to treat for 10 days and then return for plain cxr since she has "macroscopic" changes on today's film which can be followed s repeat ct at this point

## 2013-01-26 NOTE — Assessment & Plan Note (Signed)

## 2013-01-26 NOTE — Progress Notes (Signed)
Quick Note:  Spoke with pt and notified of results per Dr. Wert. Pt verbalized understanding and denied any questions.  ______ 

## 2013-01-26 NOTE — Patient Instructions (Addendum)
augmentin 875 twice daily x 10 days  Please remember to go to the lab and x-ray department downstairs for your tests - we will call you with the results when they are available.  The key is to stop smoking completely before smoking completely stops you!     Please schedule a follow up office visit in 4 weeks, sooner if needed with cxr  Harrel Carina NP, or Loraine Freid

## 2013-01-31 ENCOUNTER — Telehealth: Payer: Self-pay | Admitting: Internal Medicine

## 2013-01-31 NOTE — Telephone Encounter (Signed)
LMOM TCB x1 Orders were placed at time of ov; no appt needed

## 2013-01-31 NOTE — Telephone Encounter (Signed)
Informed pt. She will be in tomorrow for labs. Nothing further was needed at this time

## 2013-02-01 ENCOUNTER — Other Ambulatory Visit (INDEPENDENT_AMBULATORY_CARE_PROVIDER_SITE_OTHER): Payer: 59

## 2013-02-01 DIAGNOSIS — R918 Other nonspecific abnormal finding of lung field: Secondary | ICD-10-CM

## 2013-02-01 LAB — CBC WITH DIFFERENTIAL/PLATELET
Basophils Absolute: 0 10*3/uL (ref 0.0–0.1)
Eosinophils Absolute: 0.1 10*3/uL (ref 0.0–0.7)
Lymphocytes Relative: 26.2 % (ref 12.0–46.0)
MCHC: 34 g/dL (ref 30.0–36.0)
Neutrophils Relative %: 65.1 % (ref 43.0–77.0)
Platelets: 430 10*3/uL — ABNORMAL HIGH (ref 150.0–400.0)
RDW: 13.4 % (ref 11.5–14.6)

## 2013-02-01 LAB — BASIC METABOLIC PANEL
Chloride: 105 mEq/L (ref 96–112)
Creatinine, Ser: 0.5 mg/dL (ref 0.4–1.2)
Potassium: 3.9 mEq/L (ref 3.5–5.1)
Sodium: 138 mEq/L (ref 135–145)

## 2013-02-01 LAB — SEDIMENTATION RATE: Sed Rate: 20 mm/hr (ref 0–22)

## 2013-02-07 NOTE — Progress Notes (Signed)
Quick Note:  Spoke with pt and notified of results per Dr. Wert. Pt verbalized understanding and denied any questions.  ______ 

## 2013-02-11 ENCOUNTER — Ambulatory Visit (INDEPENDENT_AMBULATORY_CARE_PROVIDER_SITE_OTHER)
Admission: RE | Admit: 2013-02-11 | Discharge: 2013-02-11 | Disposition: A | Discharge status: Home / Self Care | Payer: 59 | Source: Ambulatory Visit | Attending: Internal Medicine | Admitting: Internal Medicine

## 2013-02-11 ENCOUNTER — Ambulatory Visit (INDEPENDENT_AMBULATORY_CARE_PROVIDER_SITE_OTHER): Payer: 59 | Admitting: Internal Medicine

## 2013-02-11 ENCOUNTER — Encounter: Payer: Self-pay | Admitting: Internal Medicine

## 2013-02-11 VITALS — BP 122/80 | HR 86 | Temp 98.6°F | Ht 61.5 in | Wt 100.0 lb

## 2013-02-11 DIAGNOSIS — R918 Other nonspecific abnormal finding of lung field: Secondary | ICD-10-CM

## 2013-02-11 DIAGNOSIS — F17200 Nicotine dependence, unspecified, uncomplicated: Secondary | ICD-10-CM

## 2013-02-11 DIAGNOSIS — J449 Chronic obstructive pulmonary disease, unspecified: Secondary | ICD-10-CM

## 2013-02-11 NOTE — Patient Instructions (Addendum)
The key is to stop smoking completely before smoking completely stops you!   Only use your albuterol (proaire) as a rescue medication to be used if you can't catch your breath by resting or doing a relaxed purse lip breathing pattern. The less you use it, the better it will work when you need it.   See Ramaswamy in 3 months with a follow cxr

## 2013-02-11 NOTE — Progress Notes (Signed)
Subjective:    Patient ID: [name], female    DOB: [birth year]   MRN: [id number]  HPI #Smoker Smoker - STarted age 67. 25 pack per day. Quit end 2012 for first time with help of wellbutrin and patch.  But recently 7 year old granddaughter from congential liver disease ("cirrhosis") moved back with her and since then smoking. Patient is primary health care provider because her daughter and 61 year old do not get along.  Smokes 1 ppd (used to smoke 2 ppd)  #Chronic cough  - Stop ACE inhibitor fall 2013  #Lung nodule - 61m RLL March 2014  -  CT scan chest 08/19/2012: 865mnoncalcified right lower lobe nodule with irregular margins.  - PET scan 09/20/12 - indeterminate uptake   #COPD Gold stage III PFT 12/10/2011  - Gold stage 3 copd - fev1 0.8L/42%, 20% BD respinse. TLC 133%, DLCO 46% - Walk test in 2013   - pulse ox 88% at first lap 185 feet but increased to 91% at 3rd lap end  #AECOPD  - OV 05/12/2012 - treated with Levaquin and prednisone - March 2014: Failed aecopd office  treatment on 08/17/2012 and hospitalized between 08/19/2012 in 08/21/2012; discharged on 10 day prednisone taper    OV 08/27/2012  COPD: Failed aecopd office  treatment on 08/17/2012 and hospitalized between 08/19/2012 in 08/21/2012; discharged on 10 day prednisone taper. Currently feeling like she is slowly limping back to normal. FEels she needs another week off; STil lwith fatigue  New RLL nodule 60m260mdiagnosed while in hospital for aecopd.  She does not believe this is potentially lung cancer. She believes she was told `nothing to worry about` but she understands need for followup. She is not interested in intervetnion curently    REC  COPD COPD - Glad you slowly getting better after recent flareup - Continue the medications and advice of the hospitalist who treated you - Continue your regular medications for COPD - Take another week off and slowly work on gaining her strength back - When  financially possible I would recommend you take oxygen at night - When time allows I would recommend you to pulmonary rehabilitation - Start N. acetylcysteine 600 mg twice a day    #Lung nodule - Do PET scan into 10 weeks  #Smoking - Really important to quit smoking for all the various reasons as discussed  #Followup - 10 weeks with pet scan results   OV 01/06/2013 Followup for the above.  -In terms of lung nodule: We will make sure there is a followup scan of the chest in mid August 2014  - In terms of COPD: Details of symptoms are listed in the table below. COPD cat score is 23 and is around baseline but she still has significant symptoms. I noticed that she is on multiple different inhalers and has poor understanding of these. She is actually taking Combivent, Advair and Spiriva. I tried explained the differences between Spiriva and Combivent and why she should not overlap these inhalers but she got upset that is to restrict her inhaler use her logic is that when she takes Combivent it helps immediately and that Spiriva is only once a day and she does not seem to get a huge benefit from it. When I suggested that she switch Advair twice a day  to Brio once a day she got upset thinking that in switching twice a day inhaler which is superior because it is twice a day to once a day  inhaler which is potentially inferior because it once a day  Terms of smoking: She continues to smoke but acknowledges that it is important for to quit but really has no plan or does not want help rec #COPD  - stop combivent as needed. Instead take pro-air as needed  - stop advair. Instead take BREO 1 puff at night  - continue spiriva  - Please take prednisone 40 mg x1 day, then 30 mg x1 day, then 20 mg x1 day, then 10 mg x1 day, and then 5 mg x1 day and stop    #Lung nodule - Do CT scan chest mid august 2014; Weill ensure there is order  #Smoking - Really important to quit smoking for all the various  reasons as discussed  #Followup - 4 weeks ith CAT score and CT results  - see NP Tammy for followup     01/26/2013 f/u ov/Kambria Grima re abn ct chest/still smoking  Chief Complaint  Patient presents with  . Acute Visit    Pt states having slight increased SOB and hemoptysis x 5 days. She states recently dxed with PNA and has not started on tx for this yet.   at baseline says sputum yellow to green each am x years then abruptly Started feeling 8/4 weak, more sob, felt hots/ sweats/ abruptly worse 8/5 with pain R post chest and then bloody sputum on 8/9.  Variable dysphagia, sticks in throat. No obvious asp events Had dental work 2 weeks before onset  Imp ? Asp pna sup segment RLL  rec augmentin 875 twice daily x 10 days The key is to stop smoking completely before smoking completely stops you!   02/11/2013 f/u ov/Jersie Beel  Chief Complaint  Patient presents with  . Follow-up    Pt reports breathing back at her baseline. Her cough has improved with no more hemoptysis and occ white sputum.   Maint on spiriva/ advair and rare need for combivent, doe with more than slow adls  No obvious daytime variabilty or assoc chronic cough or cp or chest tightness, subjective wheeze overt sinus   symptoms. No unusual exp or h/o childhood pna/ asthma or knowledge of premature birth.   Sleeping ok on 2lpm without nocturnal  or early am exacerbation  of respiratory  c/o's or need for noct saba. Also denies any obvious fluctuation of symptoms with weather or environmental changes or other aggravating or alleviating factors except as outlined above.  Current Medications, Allergies, Past Medical History, Past Surgical History, Family History, and Social History were reviewed in [employer] record.  ROS  The following are not active complaints unless bolded sore throat, dysphagia, dental problems, itching, sneezing,  nasal congestion or excess/ purulent secretions, ear ache,   fever, chills,  sweats, unintended wt loss, pleuritic or exertional cp, hemoptysis,  orthopnea pnd or leg swelling, presyncope, palpitations, heartburn, abdominal pain, anorexia, nausea, vomiting, diarrhea  or change in bowel or urinary habits, change in stools or urine, dysuria,hematuria,  rash, arthralgias, visual complaints, headache, numbness weakness or ataxia or problems with walking or coordination,  change in mood/affect or memory.        Objective:   Physical Exam Vitals reviewed.  Chronically ill thin wf nad  Wt Readings from Last 3 Encounters:  02/11/13 100 lb (45.36 kg)  01/26/13 100 lb 3.2 oz (45.45 kg)  01/06/13 100 lb 6.4 oz (45.541 kg)    HEENT mild turbinate edema.  Oropharynx no thrush or excess pnd or cobblestoning.  No  JVD or cervical adenopathy. Mild accessory muscle hypertrophy. Trachea midline, nl thryroid. Chest was hyperinflated by percussion with diminished breath sounds and moderate increased exp time without wheeze. Hoover sign positive at mid inspiration. Regular rate and rhythm without murmur gallop or rub or increase P2 or edema.  Abd: no hsm, nl excursion. Ext warm without cyanosis or clubbing.    CTchest 01/24/13 1. New airspace consolidation, possibly with a cavitary component,  in the superior segment right lower lobe. Previously seen right  lower lobe nodule is obscured. New air space disease in the medial  left upper lobe. Collectively, findings are indicative of  pneumonia. Rapidly progressive right lower lobe bronchogenic  carcinoma is considered much less likely. Follow-up to clearing is  recommended.    CXR  02/11/2013 :  marked improvement  Changes in  RLL sup segment     Assessment & Plan:

## 2013-02-12 NOTE — Assessment & Plan Note (Addendum)
pfts 11/2011  0.86 (42%) ratio 35   Adequate control on present rx, reviewed > no change in rx needed  But needs to quit smoking now  In terms of prn, change to prn saba s sama recommended > proaire up to q 4h prn  The proper method of use, as well as anticipated side effects, of a metered-dose inhaler are discussed and demonstrated to the patient. Improved effectiveness after extensive coaching during this visit to a level of approximately  75%

## 2013-02-12 NOTE — Assessment & Plan Note (Signed)
-  rx augmentin 875 bid x 10 days > much improved 02/11/13 - c/w asp injury ? Related to dental issues but definitely needs f/u with Dr Chase Caller and commit to completely quit smoking.

## 2013-02-12 NOTE — Assessment & Plan Note (Signed)

## 2013-03-20 ENCOUNTER — Other Ambulatory Visit: Payer: Self-pay | Admitting: Internal Medicine

## 2013-04-06 ENCOUNTER — Telehealth: Payer: Self-pay | Admitting: Internal Medicine

## 2013-04-06 MED ORDER — AMOXICILLIN-POT CLAVULANATE 875-125 MG PO TABS: 1.0000 | ORAL_TABLET | Freq: Two times a day (BID) | ORAL | Status: DC

## 2013-04-06 NOTE — Telephone Encounter (Signed)
Augmentin 875 mg take one pill twice daily  X 10 days - take at breakfast and supper with large glass of water.  It would help reduce the usual side effects (diarrhea and yeast infections) if you ate cultured yogurt at lunch.

## 2013-04-06 NOTE — Telephone Encounter (Signed)
LMTCB x 1 

## 2013-04-06 NOTE — Telephone Encounter (Signed)
Pt is aware of recs. Nothing further needed 

## 2013-04-06 NOTE — Telephone Encounter (Signed)
I spoke with pt. She c/o cough w/ thick green phlem, wheezing, chest tx, slight increase SOB x yesterday. She reports her low grade fever has only been up to 98.6. Pt is requesting an ABX called in. MR is not on the schedule. MW last seen pt. Please advise thanks  No Known Allergies

## 2013-05-16 ENCOUNTER — Other Ambulatory Visit: Payer: Self-pay | Admitting: Internal Medicine

## 2013-06-13 ENCOUNTER — Telehealth: Payer: Self-pay | Admitting: Internal Medicine

## 2013-06-13 MED ORDER — AZITHROMYCIN 250 MG PO TABS: ORAL_TABLET | ORAL | Status: DC

## 2013-06-13 NOTE — Telephone Encounter (Signed)
Last OV 02-11-13, COPD pt. Pt is c/o having productive cough with green phlegm, fever of 101.5 on Saturday, increased SOB with activity, wheezing, chest tightness and sinus congestion x 3 days. Pt is taking mucinex and tylenol which has helped with the cough and fever. Pt is requesting rx be called in, she is at work an cannot come in for [place], non available. Please advise. [place] Bing, CMA No Known Allergies

## 2013-06-13 NOTE — Telephone Encounter (Signed)
lmomtcb x1 for pt

## 2013-06-13 NOTE — Telephone Encounter (Signed)
Zpak, f/u next week if not better

## 2013-06-13 NOTE — Telephone Encounter (Signed)
rx sent and pt advised. [place] Bing, CMA

## 2013-06-13 NOTE — Telephone Encounter (Signed)
Returning a call [phone]

## 2013-06-14 ENCOUNTER — Telehealth: Payer: Self-pay | Admitting: Internal Medicine

## 2013-06-14 NOTE — Telephone Encounter (Signed)
Error.  Wrong doc.  Satira Anis

## 2013-06-14 NOTE — Telephone Encounter (Signed)
Pt was given abx yesterday, see phone note. Pt states that she is aware she just started meds but that she is having a lot of SOB and wants to set an appt in case she continues to get worse. I offered an appt tomorrow morning but the pt cannot come because she is working. This pt is familiar to me and she has a history of waiting too long before getting treatment. I advised if she is feeling worse she should come in. Pt states she cannot come in and asks for an appt on Friday. Nothing open so I doubled Dr. Melvyn Novas at 2pm and advised the pt if she gets worse to please call for sooner appt or if we are closed to go to er/urgent care if needed. Pt states she will and if she is improving she will call to cancel her appt for Friday. [name], CMA

## 2013-06-17 ENCOUNTER — Encounter: Payer: Self-pay | Admitting: Internal Medicine

## 2013-06-17 ENCOUNTER — Ambulatory Visit (INDEPENDENT_AMBULATORY_CARE_PROVIDER_SITE_OTHER)
Admission: RE | Admit: 2013-06-17 | Discharge: 2013-06-17 | Disposition: A | Discharge status: Home / Self Care | Payer: 59 | Source: Ambulatory Visit | Attending: Internal Medicine | Admitting: Internal Medicine

## 2013-06-17 ENCOUNTER — Ambulatory Visit (INDEPENDENT_AMBULATORY_CARE_PROVIDER_SITE_OTHER): Payer: 59 | Admitting: Internal Medicine

## 2013-06-17 ENCOUNTER — Other Ambulatory Visit: Payer: Self-pay | Admitting: Internal Medicine

## 2013-06-17 VITALS — BP 120/84 | HR 102 | Temp 98.2°F | Ht 62.0 in | Wt 101.6 lb

## 2013-06-17 DIAGNOSIS — R918 Other nonspecific abnormal finding of lung field: Secondary | ICD-10-CM

## 2013-06-17 DIAGNOSIS — J449 Chronic obstructive pulmonary disease, unspecified: Secondary | ICD-10-CM

## 2013-06-17 DIAGNOSIS — F17200 Nicotine dependence, unspecified, uncomplicated: Secondary | ICD-10-CM

## 2013-06-17 MED ORDER — ALBUTEROL SULFATE HFA 108 (90 BASE) MCG/ACT IN AERS
2.0000 | INHALATION_SPRAY | Freq: Four times a day (QID) | RESPIRATORY_TRACT | Status: DC | PRN
Start: 2013-06-17 — End: 2013-06-17

## 2013-06-17 MED ORDER — ALBUTEROL SULFATE HFA 108 (90 BASE) MCG/ACT IN AERS: 2.0000 | INHALATION_SPRAY | RESPIRATORY_TRACT | Status: DC | PRN

## 2013-06-17 MED ORDER — ALBUTEROL SULFATE HFA 108 (90 BASE) MCG/ACT IN AERS: 2.0000 | INHALATION_SPRAY | Freq: Four times a day (QID) | RESPIRATORY_TRACT | Status: DC | PRN

## 2013-06-17 MED ORDER — PREDNISONE 10 MG PO TABS: ORAL_TABLET | ORAL | Status: DC

## 2013-06-17 NOTE — Progress Notes (Signed)
Subjective:    Patient ID: [name], female    DOB: [birth year]   MRN: [id number]  HPI #Smoker Smoker - STarted age 68. 25 pack per day. Quit end 2012 for first time with help of wellbutrin and patch.  But recently 7 year old granddaughter from congential liver disease ("cirrhosis") moved back with her and since then smoking. Patient is primary health care provider because her daughter and 61 year old do not get along.  Smokes 1 ppd (used to smoke 2 ppd)  #Chronic cough  - Stop ACE inhibitor fall 2013  #Lung nodule - 61m RLL March 2014  -  CT scan chest 08/19/2012: 865mnoncalcified right lower lobe nodule with irregular margins.  - PET scan 09/20/12 - indeterminate uptake   #COPD Gold stage III PFT 12/10/2011  - Gold stage 3 copd - fev1 0.8L/42%, 20% BD respinse. TLC 133%, DLCO 46% - Walk test in 2013   - pulse ox 88% at first lap 185 feet but increased to 91% at 3rd lap end  #AECOPD  - OV 05/12/2012 - treated with Levaquin and prednisone - March 2014: Failed aecopd office  treatment on 08/17/2012 and hospitalized between 08/19/2012 in 08/21/2012; discharged on 10 day prednisone taper    OV 08/27/2012  COPD: Failed aecopd office  treatment on 08/17/2012 and hospitalized between 08/19/2012 in 08/21/2012; discharged on 10 day prednisone taper. Currently feeling like she is slowly limping back to normal. FEels she needs another week off; STil lwith fatigue  New RLL nodule 60m260mdiagnosed while in hospital for aecopd.  She does not believe this is potentially lung cancer. She believes she was told `nothing to worry about` but she understands need for followup. She is not interested in intervetnion curently    REC  COPD COPD - Glad you slowly getting better after recent flareup - Continue the medications and advice of the hospitalist who treated you - Continue your regular medications for COPD - Take another week off and slowly work on gaining her strength back - When  financially possible I would recommend you take oxygen at night - When time allows I would recommend you to pulmonary rehabilitation - Start N. acetylcysteine 600 mg twice a day    #Lung nodule - Do PET scan into 10 weeks  #Smoking - Really important to quit smoking for all the various reasons as discussed  #Followup - 10 weeks with pet scan results   OV 01/06/2013 Followup for the above.  -In terms of lung nodule: We will make sure there is a followup scan of the chest in mid August 2014  - In terms of COPD: Details of symptoms are listed in the table below. COPD cat score is 23 and is around baseline but she still has significant symptoms. I noticed that she is on multiple different inhalers and has poor understanding of these. She is actually taking Combivent, Advair and Spiriva. I tried explained the differences between Spiriva and Combivent and why she should not overlap these inhalers but she got upset that is to restrict her inhaler use her logic is that when she takes Combivent it helps immediately and that Spiriva is only once a day and she does not seem to get a huge benefit from it. When I suggested that she switch Advair twice a day  to Brio once a day she got upset thinking that in switching twice a day inhaler which is superior because it is twice a day to once a day  inhaler which is potentially inferior because it once a day  Terms of smoking: She continues to smoke but acknowledges that it is important for to quit but really has no plan or does not want help rec #COPD  - stop combivent as needed. Instead take pro-air as needed  - stop advair. Instead take BREO 1 puff at night  - continue spiriva  - Please take prednisone 40 mg x1 day, then 30 mg x1 day, then 20 mg x1 day, then 10 mg x1 day, and then 5 mg x1 day and stop    #Lung nodule - Do CT scan chest mid august 2014; Weill ensure there is order  #Smoking - Really important to quit smoking for all the various  reasons as discussed  #Followup - 4 weeks ith CAT score and CT results  - see NP Tammy for followup     01/26/2013 f/u ov/Rodneisha Bonnet re abn ct chest/still smoking  Chief Complaint  Patient presents with  . Acute Visit    Pt states having slight increased SOB and hemoptysis x 5 days. She states recently dxed with PNA and has not started on tx for this yet.   at baseline says sputum yellow to green each am x years then abruptly Started feeling 8/4 weak, more sob, felt hots/ sweats/ abruptly worse 8/5 with pain R post chest and then bloody sputum on 8/9.  Variable dysphagia, sticks in throat. No obvious asp events Had dental work 2 weeks before onset  Imp ? Asp pna sup segment RLL  rec augmentin 875 twice daily x 10 days The key is to stop smoking completely before smoking completely stops you!   02/11/2013 f/u ov/Voncille Simm  Chief Complaint  Patient presents with  . Follow-up    Pt reports breathing back at her baseline. Her cough has improved with no more hemoptysis and occ white sputum.   Maint on spiriva/ advair and rare need for combivent, doe with more than slow adls rec The key is to stop smoking completely before smoking completely stops you!  Only use your albuterol (proaire) as a rescue medication to be used if you can't catch your breath by resting or doing a relaxed purse lip breathing pattern. The less you use it, the better it will work when you need it.  See Ramaswamy in 3 months with a follow cxr > never returned   06/17/2013 f/u ov/Karleigh Bunte re: aecopd Chief Complaint  Patient presents with  . Follow-up    Increased SOB, cough and wheezing x1 week   zpak called in on 06/13/13 and mucus turned white but still very congested cough. Using advair/ spiriva as maint and now increase need for duoneb up to twice daily    No obvious daytime variabilty or assoc   cp or chest tightness, subjective wheeze overt sinus   symptoms. No unusual exp or h/o childhood pna/ asthma or knowledge of  premature birth.   Sleeping ok on 2lpm without nocturnal  or early am exacerbation  of respiratory  c/o's or need for noct saba. Also denies any obvious fluctuation of symptoms with weather or environmental changes or other aggravating or alleviating factors except as outlined above.  Current Medications, Allergies, Past Medical History, Past Surgical History, Family History, and Social History were reviewed in [employer] record.  ROS  The following are not active complaints unless bolded sore throat, dysphagia, dental problems, itching, sneezing,  nasal congestion or excess/ purulent secretions, ear ache,   fever, chills,  sweats, unintended wt loss, pleuritic or exertional cp, hemoptysis,  orthopnea pnd or leg swelling, presyncope, palpitations, heartburn, abdominal pain, anorexia, nausea, vomiting, diarrhea  or change in bowel or urinary habits, change in stools or urine, dysuria,hematuria,  rash, arthralgias, visual complaints, headache, numbness weakness or ataxia or problems with walking or coordination,  change in mood/affect or memory.        Objective:   Physical Exam  Vitals reviewed.  Chronically ill thin wf nad  Wt Readings from Last 3 Encounters:  06/17/13 101 lb 9.6 oz (46.085 kg)  02/11/13 100 lb (45.36 kg)  01/26/13 100 lb 3.2 oz (45.45 kg)      .   HEENT mild turbinate edema.  Oropharynx no thrush or excess pnd or cobblestoning.  No JVD or cervical adenopathy. Mild accessory muscle hypertrophy. Trachea midline, nl thryroid. Chest was hyperinflated by percussion with diminished breath sounds and moderate increased exp time without wheeze. Hoover sign positive at mid inspiration. Regular rate and rhythm without murmur gallop or rub or increase P2 or edema.  Abd: no hsm, nl excursion. Ext warm without cyanosis or clubbing.      CTchest 01/24/13 1. New airspace consolidation, possibly with a cavitary component,  in the superior segment right lower  lobe. Previously seen right  lower lobe nodule is obscured. New air space disease in the medial  left upper lobe. Collectively, findings are indicative of  pneumonia. Rapidly progressive right lower lobe bronchogenic  carcinoma is considered much less likely. Follow-up to clearing is  recommended.    CXR  06/17/2013 :  Interval development of a right suprahilar opacity on the frontal  radiograph. This may correspond to posterior opacity on the lateral  view. Although no mass is seen in this area on the 01/24/2013 CT,  given the extent of COPD, differential considerations include  development of a primary bronchogenic carcinoma or a somewhat  atypical appearance of infection      Assessment & Plan:

## 2013-06-17 NOTE — Patient Instructions (Addendum)
Prednisone 10 mg take  4 each am x 2 days, 3 x 2 days,   2 each am x 2 days,  1 each am x 2 days and stop   Only use your albuterol (proaire) as a rescue medication to be used if you can't catch your breath by resting or doing a relaxed purse lip breathing pattern.  - The less you use it, the better it will work when you need it. - Ok to use up to 2 puffs every 4 hours if you must but call for immediate appointment if use goes up over your usual need - Don't leave home without it !!  (think of it like your spare tire for your car)   For cough >  mucinex or mucinex dm 1200 mg every 12 hours  Please remember to go to the   x-ray department downstairs for your tests - we will call you with the results when they are available.  late add ? Lung mass developing / ? Recurrent areas of pna > needs CT chest / sinus ct and f/u with Ramaswamy in 2 weeks p completes 10 d augmentin

## 2013-06-18 NOTE — Assessment & Plan Note (Signed)

## 2013-06-18 NOTE — Assessment & Plan Note (Addendum)
pfts 11/2011  0.86 (42%) ratio 35   DDX of  difficult airways managment all start with A and  include Adherence, Ace Inhibitors, Acid Reflux, Active Sinus Disease, Alpha 1 Antitripsin deficiency, Anxiety masquerading as Airways dz,  ABPA,  allergy(esp in young), Aspiration (esp in elderly), Adverse effects of DPI,  Active smokers, plus two Bs  = Bronchiectasis and Beta blocker use..and one C= CHF   Adherence is always the initial "prime suspect" and is a multilayered concern that requires a "trust but verify" approach in every patient - starting with knowing how to use medications, especially inhalers, correctly, keeping up with refills and understanding the fundamental difference between maintenance and prns vs those medications only taken for a very short course and then stopped and not refilled. The proper method of use, as well as anticipated side effects, of a metered-dose inhaler are discussed and demonstrated to the patient. Improved effectiveness after extensive coaching during this visit to a level of approximately  75% so change from prn combivent to prn proair as already on spiriva with adequate rx of M3 receptors  Active smoking > see sep a/p

## 2013-06-20 ENCOUNTER — Other Ambulatory Visit: Payer: Self-pay | Admitting: Internal Medicine

## 2013-06-20 DIAGNOSIS — R918 Other nonspecific abnormal finding of lung field: Secondary | ICD-10-CM

## 2013-06-20 DIAGNOSIS — R05 Cough: Secondary | ICD-10-CM

## 2013-06-20 DIAGNOSIS — R053 Chronic cough: Secondary | ICD-10-CM

## 2013-06-20 NOTE — Progress Notes (Signed)
Quick Note:  Done ______

## 2013-06-20 NOTE — Assessment & Plan Note (Signed)
The pattern is not typcial for either infection or ca as seems to migrate but not typical of boop either so not clear what the etiology it.  Rec complete a CT Chest / sinus p augmentin x 10 days ( around 06/30/12 ) then f/u [facility]

## 2013-06-22 ENCOUNTER — Telehealth: Payer: Self-pay | Admitting: Internal Medicine

## 2013-06-22 MED ORDER — AMOXICILLIN-POT CLAVULANATE 875-125 MG PO TABS: 1.0000 | ORAL_TABLET | Freq: Two times a day (BID) | ORAL | Status: DC

## 2013-06-22 NOTE — Telephone Encounter (Signed)
Pt is aware that rx has been sent to the pharmacy.

## 2013-06-22 NOTE — Telephone Encounter (Signed)
Pt was seen by MW on 06/17/13 and these were the recs:     Prednisone 10 mg take 4 each am x 2 days, 3 x 2 days, 2 each am x 2 days, 1 each am x 2 days and stop  Only use your albuterol (proaire) as a rescue medication to be used if you can't catch your breath by resting or doing a relaxed purse lip breathing pattern.  - The less you use it, the better it will work when you need it.  - Ok to use up to 2 puffs every 4 hours if you must but call for immediate appointment if use goes up over your usual need  - Don't leave home without it !! (think of it like your spare tire for your car)  For cough > mucinex or mucinex dm 1200 mg every 12 hours  Please remember to go to the x-ray department downstairs for your tests - we will call you with the results when they are available.  late add ? Lung mass developing / ? Recurrent areas of pna > needs CT chest / sinus ct and f/u with Ramaswamy in 2 weeks p completes 10 d augmentin   Pt stated that the augmentin was not sent to the pharmacy.  MW please advise if ok to send to the pharmacy for the pt.  No Known Allergies

## 2013-06-22 NOTE — Telephone Encounter (Signed)
Ok to call in  Augmentin 875 mg take one pill twice daily  X 10 days - take at breakfast and supper with large glass of water.  It would help reduce the usual side effects (diarrhea and yeast infections) if you ate cultured yogurt at lunch.

## 2013-07-04 ENCOUNTER — Inpatient Hospital Stay: Admission: RE | Admit: 2013-07-04 | Payer: 59 | Source: Ambulatory Visit

## 2013-07-04 ENCOUNTER — Other Ambulatory Visit: Payer: 59

## 2013-08-13 ENCOUNTER — Other Ambulatory Visit: Payer: Self-pay | Admitting: Internal Medicine

## 2013-08-17 ENCOUNTER — Other Ambulatory Visit: Payer: Self-pay | Admitting: *Deleted

## 2013-08-17 MED ORDER — FLUTICASONE-SALMETEROL 250-50 MCG/DOSE IN AEPB: INHALATION_SPRAY | RESPIRATORY_TRACT | Status: DC

## 2013-08-23 ENCOUNTER — Other Ambulatory Visit: Payer: Self-pay | Admitting: Internal Medicine

## 2013-08-26 ENCOUNTER — Telehealth: Payer: Self-pay | Admitting: Internal Medicine

## 2013-08-26 MED ORDER — PREDNISONE 10 MG PO TABS: ORAL_TABLET | ORAL | Status: DC

## 2013-08-26 MED ORDER — AMOXICILLIN-POT CLAVULANATE 875-125 MG PO TABS: 1.0000 | ORAL_TABLET | Freq: Two times a day (BID) | ORAL | Status: DC

## 2013-08-26 NOTE — Telephone Encounter (Signed)
Last OV 06-17-13. Pt is c/o low grade fever, chest tightness, ear pain, productive cough with white phlegm, increased SOB, wheezing, nasal congestion x 5 days, today is worse. Pt is taking mucinex DM. Pt is requesting an abx. Please advise. [place] Bing, CMA No Known Allergies

## 2013-08-26 NOTE — Telephone Encounter (Signed)
Called and made pt aware of recs. rx's call in. She will call back for OV

## 2013-08-26 NOTE — Telephone Encounter (Signed)
Augmentin 875 mg take one pill twice daily  X 10 days - take at breakfast and supper with large glass of water.  It would help reduce the usual side effects (diarrhea and yeast infections) if you ate cultured yogurt at lunch.  Prednisone 10 mg take  4 each am x 2 days,   2 each am x 2 days,  1 each am x 2 days and stop Needs ov 2 weeks with cxr

## 2013-09-09 ENCOUNTER — Other Ambulatory Visit: Payer: Self-pay | Admitting: Internal Medicine

## 2013-09-13 ENCOUNTER — Telehealth: Payer: Self-pay | Admitting: *Deleted

## 2013-09-13 NOTE — Telephone Encounter (Signed)
appt set for 09/26/13

## 2013-09-13 NOTE — Telephone Encounter (Signed)
LMTCB for the pt 

## 2013-09-13 NOTE — Telephone Encounter (Signed)
Message copied by Rosana Berger on Tue Sep 13, 2013 11:51 AM ------      Message from: Tanda Rockers      Created: Fri Aug 26, 2013  5:03 PM       cxr / f/u ov due by now ------

## 2013-09-26 ENCOUNTER — Encounter: Payer: Self-pay | Admitting: Internal Medicine

## 2013-09-26 ENCOUNTER — Ambulatory Visit (INDEPENDENT_AMBULATORY_CARE_PROVIDER_SITE_OTHER)
Admission: RE | Admit: 2013-09-26 | Discharge: 2013-09-26 | Disposition: A | Discharge status: Home / Self Care | Payer: 59 | Source: Ambulatory Visit | Attending: Internal Medicine | Admitting: Internal Medicine

## 2013-09-26 ENCOUNTER — Ambulatory Visit (INDEPENDENT_AMBULATORY_CARE_PROVIDER_SITE_OTHER): Payer: 59 | Admitting: Internal Medicine

## 2013-09-26 VITALS — BP 124/70 | HR 77 | Temp 98.5°F | Ht 62.0 in | Wt 101.0 lb

## 2013-09-26 DIAGNOSIS — J449 Chronic obstructive pulmonary disease, unspecified: Secondary | ICD-10-CM

## 2013-09-26 DIAGNOSIS — R918 Other nonspecific abnormal finding of lung field: Secondary | ICD-10-CM

## 2013-09-26 DIAGNOSIS — F17200 Nicotine dependence, unspecified, uncomplicated: Secondary | ICD-10-CM

## 2013-09-26 MED ORDER — ALBUTEROL SULFATE HFA 108 (90 BASE) MCG/ACT IN AERS: 2.0000 | INHALATION_SPRAY | RESPIRATORY_TRACT | Status: DC | PRN

## 2013-09-26 MED ORDER — PREDNISONE 10 MG PO TABS: ORAL_TABLET | ORAL | Status: DC

## 2013-09-26 MED ORDER — TIOTROPIUM BROMIDE MONOHYDRATE 18 MCG IN CAPS: ORAL_CAPSULE | RESPIRATORY_TRACT | Status: DC

## 2013-09-26 MED ORDER — BUDESONIDE-FORMOTEROL FUMARATE 160-4.5 MCG/ACT IN AERO
INHALATION_SPRAY | RESPIRATORY_TRACT | Status: DC
Start: 2013-09-26 — End: 2013-09-26

## 2013-09-26 MED ORDER — BUDESONIDE-FORMOTEROL FUMARATE 160-4.5 MCG/ACT IN AERO: INHALATION_SPRAY | RESPIRATORY_TRACT | Status: DC

## 2013-09-26 NOTE — Assessment & Plan Note (Signed)
>   3 m  I took an extended  opportunity with this patient to outline the consequences of continued cigarette use  in airway disorders based on all the data we have from the multiple national lung health studies (perfomed over decades at millions of dollars in cost)  indicating that smoking cessation, not choice of inhalers or physicians, is the most important aspect of care.   

## 2013-09-26 NOTE — Addendum Note (Signed)
Addended by: Christinia Gully B on: 09/26/2013 07:53 PM   Modules accepted: Orders

## 2013-09-26 NOTE — Assessment & Plan Note (Signed)
-  rx augmentin 875 bid x 10 days > much improved 02/11/13  - Repeat cxr 06/17/12  ? R perihilar mass > rec CT Chest / sinus p augmentin x 10 days to be completed around 06/30/12  - repeat cxr 09/26/2013 > marked improvement R suprahilar density, not seen on lateral at all projecting over sup segment RLL as prev ct sagittal suggests  Much better so no f/u cxr needed

## 2013-09-26 NOTE — Patient Instructions (Addendum)
Plan A = automatic = symbicort 160 Take 2 puffs first thing in am and then another 2 puffs about 12 hours later.                                     Spiriva 2pffs off same capsule immediately after symbicort  Work on inhaler technique:  relax and gently blow all the way out then take a nice smooth deep breath back in, triggering the inhaler at same time you start breathing in.  Hold for up to 5 seconds if you can.  Rinse and gargle with water when done  Plan B = Backup  Only use your albuterol (ventolin/ blue inhaler) as a rescue medication to be used if you can't catch your breath by resting or doing a relaxed purse lip breathing pattern.  - The less you use it, the better it will work when you need it. - Ok to use up to 2 puffs  every 4 hours if you must but call for immediate appointment if use goes up over your usual need - Don't leave home without it !!  (think of it like the spare tire for your car)     Plan C= Neb, use up to every 6 hours if no relief from ventolin   Prednisone 10 mg take  4 each am x 2 days,   2 each am x 2 days,  1 each am x 2 days and stop   Please schedule a follow up office visit in 2 weeks, sooner if needed to see Chase Caller or Tammy NP

## 2013-09-26 NOTE — Progress Notes (Addendum)
Subjective:    Patient ID: [name], female    DOB: [birth year]   MRN: [id number]  HPI #Smoker Smoker - STarted age 68. 25 pack per day. Quit end 2012 for first time with help of wellbutrin and patch.  But recently 7 year old granddaughter from congential liver disease ("cirrhosis") moved back with her and since then smoking. Patient is primary health care provider because her daughter and 61 year old do not get along.  Smokes 1 ppd (used to smoke 2 ppd)  #Chronic cough  - Stop ACE inhibitor fall 2013  #Lung nodule - 61m RLL March 2014  -  CT scan chest 08/19/2012: 865mnoncalcified right lower lobe nodule with irregular margins.  - PET scan 09/20/12 - indeterminate uptake   #COPD Gold stage III PFT 12/10/2011  - Gold stage 3 copd - fev1 0.8L/42%, 20% BD respinse. TLC 133%, DLCO 46% - Walk test in 2013   - pulse ox 88% at first lap 185 feet but increased to 91% at 3rd lap end  #AECOPD  - OV 05/12/2012 - treated with Levaquin and prednisone - March 2014: Failed aecopd office  treatment on 08/17/2012 and hospitalized between 08/19/2012 in 08/21/2012; discharged on 10 day prednisone taper    OV 08/27/2012  COPD: Failed aecopd office  treatment on 08/17/2012 and hospitalized between 08/19/2012 in 08/21/2012; discharged on 10 day prednisone taper. Currently feeling like she is slowly limping back to normal. FEels she needs another week off; STil lwith fatigue  New RLL nodule 60m260mdiagnosed while in hospital for aecopd.  She does not believe this is potentially lung cancer. She believes she was told `nothing to worry about` but she understands need for followup. She is not interested in intervetnion curently    REC  COPD COPD - Glad you slowly getting better after recent flareup - Continue the medications and advice of the hospitalist who treated you - Continue your regular medications for COPD - Take another week off and slowly work on gaining her strength back - When  financially possible I would recommend you take oxygen at night - When time allows I would recommend you to pulmonary rehabilitation - Start N. acetylcysteine 600 mg twice a day    #Lung nodule - Do PET scan into 10 weeks  #Smoking - Really important to quit smoking for all the various reasons as discussed  #Followup - 10 weeks with pet scan results   OV 01/06/2013 Followup for the above.  -In terms of lung nodule: We will make sure there is a followup scan of the chest in mid August 2014  - In terms of COPD: Details of symptoms are listed in the table below. COPD cat score is 23 and is around baseline but she still has significant symptoms. I noticed that she is on multiple different inhalers and has poor understanding of these. She is actually taking Combivent, Advair and Spiriva. I tried explained the differences between Spiriva and Combivent and why she should not overlap these inhalers but she got upset that is to restrict her inhaler use her logic is that when she takes Combivent it helps immediately and that Spiriva is only once a day and she does not seem to get a huge benefit from it. When I suggested that she switch Advair twice a day  to Brio once a day she got upset thinking that in switching twice a day inhaler which is superior because it is twice a day to once a day  inhaler which is potentially inferior because it once a day  Terms of smoking: She continues to smoke but acknowledges that it is important for to quit but really has no plan or does not want help rec #COPD  - stop combivent as needed. Instead take pro-air as needed  - stop advair. Instead take BREO 1 puff at night  - continue spiriva  - Please take prednisone 40 mg x1 day, then 30 mg x1 day, then 20 mg x1 day, then 10 mg x1 day, and then 5 mg x1 day and stop    #Lung nodule - Do CT scan chest mid august 2014; Weill ensure there is order  #Smoking - Really important to quit smoking for all the various  reasons as discussed  #Followup - 4 weeks ith CAT score and CT results  - see NP Tammy for followup     01/26/2013 f/u ov/[name] re abn ct chest/still smoking  Chief Complaint  Patient presents with  . Acute Visit    Pt states having slight increased SOB and hemoptysis x 5 days. She states recently dxed with PNA and has not started on tx for this yet.   at baseline says sputum yellow to green each am x years then abruptly Started feeling 8/4 weak, more sob, felt hots/ sweats/ abruptly worse 8/5 with pain R post chest and then bloody sputum on 8/9.  Variable dysphagia, sticks in throat. No obvious asp events Had dental work 2 weeks before onset  Imp ? Asp pna sup segment RLL  rec augmentin 875 twice daily x 10 days The key is to stop smoking completely before smoking completely stops you!   02/11/2013 f/u ov/[name]  Chief Complaint  Patient presents with  . Follow-up    Pt reports breathing back at her baseline. Her cough has improved with no more hemoptysis and occ white sputum.   Maint on spiriva/ advair and rare need for combivent, doe with more than slow adls rec The key is to stop smoking completely before smoking completely stops you!  Only use your albuterol (proaire) as a rescue medication to be used if you can't catch your breath by resting or doing a relaxed purse lip breathing pattern. The less you use it, the better it will work when you need it.  See Ramaswamy in 3 months with a follow cxr > never returned   06/17/2013 f/u ov/[name] re: aecopd Chief Complaint  Patient presents with  . Follow-up    Increased SOB, cough and wheezing x1 week   zpak called in on 06/13/13 and mucus turned white but still very congested cough. Using advair/ spiriva as maint and now increase need for duoneb up to twice daily  rec Prednisone 10 mg take  4 each am x 2 days, 3 x 2 days,   2 each am x 2 days,  1 each am x 2 days and stop  Only use your albuterol (proaire) as a rescue medication  For  cough >  mucinex or mucinex dm 1200 mg every 12 hours   09/26/2013 f/u ov/[name] re: still smoking plus advair/ spiriva very confused with timing of maint and prns Chief Complaint  Patient presents with  . Follow-up    Breathing unchanged since the last visit. She also c/o chest tightness for the past 3 days. Cough is prod with large amounts of clear sputum. She uses rescue inhaler approx 2 times per wk on average and also using duoneb a few times per day.  doe x > room to room walking/ not using mucinex as rec, no purulent sputum Chest tightness better p saba   No obvious daytime variabilty or assoc   cp or   subjective wheeze overt sinus   symptoms. No unusual exp or h/o childhood pna/ asthma or knowledge of premature birth.   Sleeping ok on 2lpm without nocturnal  or early am exacerbation  of respiratory  c/o's or need for noct saba. Also denies any obvious fluctuation of symptoms with weather or environmental changes or other aggravating or alleviating factors except as outlined above.  Current Medications, Allergies, Past Medical History, Past Surgical History, Family History, and Social History were reviewed in [facility] record.  ROS  The following are not active complaints unless bolded sore throat, dysphagia, dental problems, itching, sneezing,  nasal congestion or excess/ purulent secretions, ear ache,   fever, chills, sweats, unintended wt loss, pleuritic or exertional cp, hemoptysis,  orthopnea pnd or leg swelling, presyncope, palpitations, heartburn, abdominal pain, anorexia, nausea, vomiting, diarrhea  or change in bowel or urinary habits, change in stools or urine, dysuria,hematuria,  rash, arthralgias, visual complaints, headache, numbness weakness or ataxia or problems with walking or coordination,  change in mood/affect or memory.        Objective:   Physical Exam  Vitals reviewed.  Chronically ill thin wf nad very congested sounding cough    09/26/2013        101  Wt Readings from Last 3 Encounters:  06/17/13 101 lb 9.6 oz (46.085 kg)  02/11/13 100 lb (45.36 kg)  01/26/13 100 lb 3.2 oz (45.45 kg)      .   HEENT mild turbinate edema.  Oropharynx no thrush or excess pnd or cobblestoning.  No JVD or cervical adenopathy. Mild accessory muscle hypertrophy. Trachea midline, nl thryroid. Chest was hyperinflated by percussion with diminished breath sounds and moderate increased exp time with mid exp bilateral  wheeze. Hoover sign positive at mid inspiration. Regular rate and rhythm without murmur gallop or rub or increase P2 or edema.  Abd: no hsm, nl excursion. Ext warm without cyanosis or clubbing.      CTchest 01/24/13 1. New airspace consolidation, possibly with a cavitary component,  in the superior segment right lower lobe. Previously seen right  lower lobe nodule is obscured. New air space disease in the medial  left upper lobe. Collectively, findings are indicative of  pneumonia. Rapidly progressive right lower lobe bronchogenic  carcinoma is considered much less likely. Follow-up to clearing is  recommended.    CXR  09/26/2013 : Resolving right suprahilar infiltrate. Continued surveillance recommended.  COPD and chronic interstitial fibrosis.        Assessment & Plan:   Outpatient Encounter Prescriptions as of 09/26/2013  Medication Sig  . diltiazem (TIAZAC) 300 MG 24 hr capsule Take 300 mg by mouth daily.  . Garlic 10 MG CAPS Take 1 tablet by mouth daily.  Marland Kitchen ipratropium-albuterol (DUONEB) 0.5-2.5 (3) MG/3ML SOLN Take 3 mLs by nebulization every 6 (six) hours as needed (FOR WHEEZING OR SHORTNESS OF BREATH).  Marland Kitchen losartan (COZAAR) 50 MG tablet take 1 tablet by mouth once daily  . tiotropium (SPIRIVA HANDIHALER) 18 MCG inhalation capsule place 1 capsule INTO HANDIHALER, and inhale once daily  . vitamin C (ASCORBIC ACID) 500 MG tablet Take 500 mg by mouth daily.  . vitamin E 400 UNIT capsule Take 400 Units by  mouth daily.  . [DISCONTINUED] albuterol (PROAIR HFA) 108 (90 BASE) MCG/ACT  inhaler Inhale 2 puffs into the lungs every 4 (four) hours as needed for wheezing or shortness of breath. 2 puffs every 4 hours as needed only  if your can't catch your breath  . [DISCONTINUED] Fluticasone-Salmeterol (ADVAIR DISKUS) 250-50 MCG/DOSE AEPB inhale 2 PUFFS INTO THE LUNGS twice a day  . [DISCONTINUED] SPIRIVA HANDIHALER 18 MCG inhalation capsule place 1 capsule INTO HANDIHALER, and inhale once daily  . albuterol (PROVENTIL HFA;VENTOLIN HFA) 108 (90 BASE) MCG/ACT inhaler Inhale 2 puffs into the lungs every 4 (four) hours as needed for wheezing or shortness of breath.  . budesonide-formoterol (SYMBICORT) 160-4.5 MCG/ACT inhaler Take 2 puffs first thing in am and then another 2 puffs about 12 hours later.  . predniSONE (DELTASONE) 10 MG tablet Take  4 each am x 2 days,   2 each am x 2 days,  1 each am x 2 days and stop  . [DISCONTINUED] amoxicillin-clavulanate (AUGMENTIN) 875-125 MG per tablet Take 1 tablet by mouth 2 (two) times daily.  . [DISCONTINUED] azithromycin (ZITHROMAX) 250 MG tablet As directed  . [DISCONTINUED] budesonide-formoterol (SYMBICORT) 160-4.5 MCG/ACT inhaler Take 2 puffs first thing in am and then another 2 puffs about 12 hours later.  . [DISCONTINUED] diltiazem (CARDIZEM CD) 180 MG 24 hr capsule Take 1 capsule by mouth daily.  . [DISCONTINUED] glucosamine-chondroitin 500-400 MG tablet Take 1 tablet by mouth 2 (two) times daily.  . [DISCONTINUED] ipratropium-albuterol (DUONEB) 0.5-2.5 (3) MG/3ML SOLN inhale contents of 1 vial every 6 hours if needed  . [DISCONTINUED] losartan (COZAAR) 25 MG tablet Take 12.5 mg by mouth daily.   . [DISCONTINUED] predniSONE (DELTASONE) 10 MG tablet Take  4 each am x 2 days,   2 each am x 2 days,  1 each am x 2 days and stop  . [DISCONTINUED] predniSONE (DELTASONE) 10 MG tablet 4 x 2 days, 2 x 2 days, 1 x 2 days, then stop

## 2013-09-26 NOTE — Assessment & Plan Note (Signed)
DDX of  difficult airways managment all start with A and  include Adherence, Ace Inhibitors, Acid Reflux, Active Sinus Disease, Alpha 1 Antitripsin deficiency, Anxiety masquerading as Airways dz,  ABPA,  allergy(esp in young), Aspiration (esp in elderly), Adverse effects of DPI,  Active smokers, plus two Bs  = Bronchiectasis and Beta blocker use..and one C= CHF   Adherence is always the initial "prime suspect" and is a multilayered concern that requires a "trust but verify" approach in every patient - starting with knowing how to use medications, especially inhalers, correctly, keeping up with refills and understanding the fundamental difference between maintenance and prns vs those medications only taken for a very short course and then stopped and not refilled.  See instructions for plans A -> C  The proper method of use, as well as anticipated side effects, of a metered-dose inhaler are discussed and demonstrated to the patient. Improved effectiveness after extensive coaching during this visit to a level of approximately  75% but 100% with dpi. Try change to symbicort plus spiriva dpi  ? Adverse effects of advair > try hfa symbicort  Active smoking > discussed separately

## 2013-10-11 ENCOUNTER — Encounter: Payer: Self-pay | Admitting: Adult Health

## 2013-10-11 ENCOUNTER — Ambulatory Visit (INDEPENDENT_AMBULATORY_CARE_PROVIDER_SITE_OTHER): Payer: 59 | Admitting: Adult Health

## 2013-10-11 VITALS — BP 124/64 | HR 78 | Temp 98.2°F | Ht 62.0 in | Wt 102.8 lb

## 2013-10-11 DIAGNOSIS — J441 Chronic obstructive pulmonary disease with (acute) exacerbation: Secondary | ICD-10-CM

## 2013-10-11 NOTE — Progress Notes (Signed)
Subjective:    Patient ID: [name], female    DOB: [birth year]   MRN: [id number]  HPI #Smoker Smoker - STarted age 68. 25 pack per day. Quit end 2012 for first time with help of wellbutrin and patch.  But recently 7 year old granddaughter from congential liver disease ("cirrhosis") moved back with her and since then smoking. Patient is primary health care provider because her daughter and 61 year old do not get along.  Smokes 1 ppd (used to smoke 2 ppd)  #Chronic cough  - Stop ACE inhibitor fall 2013  #Lung nodule - 61m RLL March 2014  -  CT scan chest 08/19/2012: 865mnoncalcified right lower lobe nodule with irregular margins.  - PET scan 09/20/12 - indeterminate uptake   #COPD Gold stage III PFT 12/10/2011  - Gold stage 3 copd - fev1 0.8L/42%, 20% BD respinse. TLC 133%, DLCO 46% - Walk test in 2013   - pulse ox 88% at first lap 185 feet but increased to 91% at 3rd lap end  #AECOPD  - OV 05/12/2012 - treated with Levaquin and prednisone - March 2014: Failed aecopd office  treatment on 08/17/2012 and hospitalized between 08/19/2012 in 08/21/2012; discharged on 10 day prednisone taper    OV 08/27/2012  COPD: Failed aecopd office  treatment on 08/17/2012 and hospitalized between 08/19/2012 in 08/21/2012; discharged on 10 day prednisone taper. Currently feeling like she is slowly limping back to normal. FEels she needs another week off; STil lwith fatigue  New RLL nodule 60m260mdiagnosed while in hospital for aecopd.  She does not believe this is potentially lung cancer. She believes she was told `nothing to worry about` but she understands need for followup. She is not interested in intervetnion curently    REC  COPD COPD - Glad you slowly getting better after recent flareup - Continue the medications and advice of the hospitalist who treated you - Continue your regular medications for COPD - Take another week off and slowly work on gaining her strength back - When  financially possible I would recommend you take oxygen at night - When time allows I would recommend you to pulmonary rehabilitation - Start N. acetylcysteine 600 mg twice a day    #Lung nodule - Do PET scan into 10 weeks  #Smoking - Really important to quit smoking for all the various reasons as discussed  #Followup - 10 weeks with pet scan results   OV 01/06/2013 Followup for the above.  -In terms of lung nodule: We will make sure there is a followup scan of the chest in mid August 2014  - In terms of COPD: Details of symptoms are listed in the table below. COPD cat score is 23 and is around baseline but she still has significant symptoms. I noticed that she is on multiple different inhalers and has poor understanding of these. She is actually taking Combivent, Advair and Spiriva. I tried explained the differences between Spiriva and Combivent and why she should not overlap these inhalers but she got upset that is to restrict her inhaler use her logic is that when she takes Combivent it helps immediately and that Spiriva is only once a day and she does not seem to get a huge benefit from it. When I suggested that she switch Advair twice a day  to Brio once a day she got upset thinking that in switching twice a day inhaler which is superior because it is twice a day to once a day  inhaler which is potentially inferior because it once a day  Terms of smoking: She continues to smoke but acknowledges that it is important for to quit but really has no plan or does not want help rec #COPD  - stop combivent as needed. Instead take pro-air as needed  - stop advair. Instead take BREO 1 puff at night  - continue spiriva  - Please take prednisone 40 mg x1 day, then 30 mg x1 day, then 20 mg x1 day, then 10 mg x1 day, and then 5 mg x1 day and stop    #Lung nodule - Do CT scan chest mid august 2014; Weill ensure there is order  #Smoking - Really important to quit smoking for all the various  reasons as discussed  #Followup - 4 weeks ith CAT score and CT results  - see NP Tasha Diaz for followup     01/26/2013 f/u ov/Wert re abn ct chest/still smoking  Chief Complaint  Patient presents with  . Acute Visit    Pt states having slight increased SOB and hemoptysis x 5 days. She states recently dxed with PNA and has not started on tx for this yet.   at baseline says sputum yellow to green each am x years then abruptly Started feeling 8/4 weak, more sob, felt hots/ sweats/ abruptly worse 8/5 with pain R post chest and then bloody sputum on 8/9.  Variable dysphagia, sticks in throat. No obvious asp events Had dental work 2 weeks before onset  Imp ? Asp pna sup segment RLL  rec augmentin 875 twice daily x 10 days The key is to stop smoking completely before smoking completely stops you!   02/11/2013 f/u ov/Wert  Chief Complaint  Patient presents with  . Follow-up    Pt reports breathing back at her baseline. Her cough has improved with no more hemoptysis and occ white sputum.   Maint on spiriva/ advair and rare need for combivent, doe with more than slow adls rec The key is to stop smoking completely before smoking completely stops you!  Only use your albuterol (proaire) as a rescue medication to be used if you can't catch your breath by resting or doing a relaxed purse lip breathing pattern. The less you use it, the better it will work when you need it.  See Ramaswamy in 3 months with a follow cxr > never returned   06/17/2013 f/u ov/Wert re: aecopd Chief Complaint  Patient presents with  . Follow-up    Increased SOB, cough and wheezing x1 week   zpak called in on 06/13/13 and mucus turned white but still very congested cough. Using advair/ spiriva as maint and now increase need for duoneb up to twice daily  rec Prednisone 10 mg take  4 each am x 2 days, 3 x 2 days,   2 each am x 2 days,  1 each am x 2 days and stop  Only use your albuterol (proaire) as a rescue medication  For  cough >  mucinex or mucinex dm 1200 mg every 12 hours   09/26/2013 f/u ov/Wert re: still smoking plus advair/ spiriva very confused with timing of maint and prns Chief Complaint  Patient presents with  . Follow-up    Breathing unchanged since the last visit. She also c/o chest tightness for the past 3 days. Cough is prod with large amounts of clear sputum. She uses rescue inhaler approx 2 times per wk on average and also using duoneb a few times per day.  doe x > room to room walking/ not using mucinex as rec, no purulent sputum Chest tightness better p saba  >>pred taper   10/11/2013 Follow up  Returns for follow up for recent COPD flare.  Was given steroid taper last ov. Feeling much better.  Reports breathing is improved since last ov, Doing well with the Symbicort.  no new complaints. Discussed smoking cessatioin  No fever, chest pain, orthopnea, or edema  CXR last ov showed improved right suprahilar infiltrate.      Current Medications, Allergies, Past Medical History, Past Surgical History, Family History, and Social History were reviewed in [employer] record.  ROS  The following are not active complaints unless bolded sore throat, dysphagia, dental problems, itching, sneezing,  nasal congestion or excess/ purulent secretions, ear ache,   fever, chills, sweats, unintended wt loss, pleuritic or exertional cp, hemoptysis,  orthopnea pnd or leg swelling, presyncope, palpitations, heartburn, abdominal pain, anorexia, nausea, vomiting, diarrhea  or change in bowel or urinary habits, change in stools or urine, dysuria,hematuria,  rash, arthralgias, visual complaints, headache, numbness weakness or ataxia or problems with walking or coordination,  change in mood/affect or memory.        Objective:   Physical Exam  Vitals reviewed. Chronically ill thin wf   09/26/2013        101>102 10/11/2013 .   HEENT mild turbinate edema.  Oropharynx no thrush or excess pnd  or cobblestoning.  No JVD or cervical adenopathy. Mild accessory muscle hypertrophy. Trachea midline, nl thryroid. Chest was hyperinflated by percussion with diminished breath sounds, no wheezing . Hoover sign positive at mid inspiration. Regular rate and rhythm without murmur gallop or rub or increase P2 or edema.  Abd: no hsm, nl excursion. Ext warm without cyanosis or clubbing.      CTchest 01/24/13 1. New airspace consolidation, possibly with a cavitary component,  in the superior segment right lower lobe. Previously seen right  lower lobe nodule is obscured. New air space disease in the medial  left upper lobe. Collectively, findings are indicative of  pneumonia. Rapidly progressive right lower lobe bronchogenic  carcinoma is considered much less likely. Follow-up to clearing is  recommended.    CXR  09/26/2013 : Resolving right suprahilar infiltrate.   COPD and chronic interstitial fibrosis.        Assessment & Plan:

## 2013-10-11 NOTE — Patient Instructions (Signed)
Continue on Symbicort and Spiriva , rinse after use.  MOST IMPORTANT GOAL IS TO QUIT SMOKING.  follow up Dr. Melvyn Novas  In 3 months and As needed

## 2013-10-11 NOTE — Assessment & Plan Note (Signed)
Recent exacerbation now resolving  Encouraged on smoking cessation  Plan  Continue on Symbicort and Spiriva , rinse after use.  MOST IMPORTANT GOAL IS TO QUIT SMOKING.  follow up Dr. Melvyn Novas  In 3 months and As needed

## 2013-12-19 ENCOUNTER — Telehealth: Payer: Self-pay | Admitting: Internal Medicine

## 2013-12-19 NOTE — Telephone Encounter (Signed)
lmomtcb x1 

## 2013-12-20 NOTE — Telephone Encounter (Signed)
lmomtcb x1 

## 2013-12-20 NOTE — Telephone Encounter (Signed)
lmomtcb x 2

## 2013-12-20 NOTE — Telephone Encounter (Signed)
Pt returned call. Please call back. [phone]

## 2013-12-21 MED ORDER — TIOTROPIUM BROMIDE MONOHYDRATE 18 MCG IN CAPS: ORAL_CAPSULE | RESPIRATORY_TRACT | Status: DC

## 2013-12-21 NOTE — Telephone Encounter (Signed)
PT states that Spiriva is on a $45 copay tier.  Incruse and Caprice Renshaw will be on a lower cost tier if MW would consider one of these.  Please advise

## 2013-12-21 NOTE — Telephone Encounter (Signed)
lmomtcb

## 2013-12-21 NOTE — Telephone Encounter (Signed)
LM detailed VM per pt request.  Samples of Spiriva were left at front desk to last until f/u with MW to go over transition to [place].

## 2013-12-21 NOTE — Telephone Encounter (Signed)
Yes but requires ov to teach it properly so ok to give samples of spiriva until can come in for transition to [place]

## 2013-12-26 ENCOUNTER — Telehealth: Payer: Self-pay | Admitting: Internal Medicine

## 2013-12-26 NOTE — Telephone Encounter (Signed)
Due to change in insurance change spirva to INCRUSE ELLIPTA once daily

## 2013-12-28 NOTE — Telephone Encounter (Signed)
Dr Melvyn Novas, please advise if this is okay thanks

## 2013-12-28 NOTE — Telephone Encounter (Signed)
That's fine

## 2013-12-28 NOTE — Telephone Encounter (Signed)
Called and spoke to pt. Informed pt that we can change her Spiriva to Incruse. Pt stated she had just picked up the spiriva and doesn't want to come in earlier to be shown how to use the Incruse, pt stated she would just wait till her appt with MW on 01/16/2014 to learn how to use Incruse. Informed pt that at f/u with MW it can be re-evaluated and teaching can occur if MW chooses to proceed with Incruse. Will forward to [place] for f/u.

## 2013-12-28 NOTE — Telephone Encounter (Signed)
lmomtcb x 1

## 2014-01-16 ENCOUNTER — Encounter: Payer: Self-pay | Admitting: Internal Medicine

## 2014-01-16 ENCOUNTER — Ambulatory Visit (INDEPENDENT_AMBULATORY_CARE_PROVIDER_SITE_OTHER): Payer: 59 | Admitting: Internal Medicine

## 2014-01-16 VITALS — BP 124/62 | HR 92 | Temp 98.5°F | Ht 62.0 in | Wt 99.4 lb

## 2014-01-16 DIAGNOSIS — IMO0001 Reserved for inherently not codable concepts without codable children: Secondary | ICD-10-CM

## 2014-01-16 DIAGNOSIS — F17200 Nicotine dependence, unspecified, uncomplicated: Secondary | ICD-10-CM

## 2014-01-16 DIAGNOSIS — J449 Chronic obstructive pulmonary disease, unspecified: Secondary | ICD-10-CM

## 2014-01-16 DIAGNOSIS — Z23 Encounter for immunization: Secondary | ICD-10-CM

## 2014-01-16 DIAGNOSIS — J4489 Other specified chronic obstructive pulmonary disease: Secondary | ICD-10-CM

## 2014-01-16 MED ORDER — UMECLIDINIUM BROMIDE 62.5 MCG/INH IN AEPB: 1.0000 | INHALATION_SPRAY | Freq: Every morning | RESPIRATORY_TRACT | Status: DC

## 2014-01-16 MED ORDER — LOSARTAN POTASSIUM 50 MG PO TABS: ORAL_TABLET | ORAL | Status: DC

## 2014-01-16 NOTE — Assessment & Plan Note (Signed)
pfts 11/2011  0.86 (42%) ratio 35  DDX of  difficult airways management all start with A and  include Adherence, Ace Inhibitors, Acid Reflux, Active Sinus Disease, Alpha 1 Antitripsin deficiency, Anxiety masquerading as Airways dz,  ABPA,  allergy(esp in young), Aspiration (esp in elderly), Adverse effects of DPI,  Active smokers, plus two Bs  = Bronchiectasis and Beta blocker use..and one C= CHF  Adherence is always the initial "prime suspect" and is a multilayered concern that requires a "trust but verify" approach in every patient - starting with knowing how to use medications, especially inhalers, correctly, keeping up with refills and understanding the fundamental difference between maintenance and prns vs those medications only taken for a very short course and then stopped and not refilled.  The proper method of use, as well as anticipated side effects, of a metered-dose inhaler are discussed and demonstrated to the patient. Improved effectiveness after extensive coaching during this visit to a level of approximately  75% with hfa/ 90% with dpi  Active smoking greatest concern > discussed separately   ? Adverse effects of dpi > ok to challenge with incruse but continue symbicort as the ics/laba for now (alternative is to just change to LAMA/LABA in form of anoro on return if does ok on dpi as her technique is better that the upper airway complaints may get worse.

## 2014-01-16 NOTE — Patient Instructions (Addendum)
Add incruse 2 puffs off one click each am only,  And only after the am  symbicort  Work on inhaler technique:  relax and gently blow all the way out then take a nice smooth deep breath back in, triggering the inhaler at same time you start breathing in.  Hold for up to 5 seconds if you can.  Rinse and gargle with water when done  The key is to stop smoking completely before smoking completely stops you!   Please schedule a follow up office visit in 3 months , call sooner if needed   Prevnar given today.

## 2014-01-16 NOTE — Progress Notes (Signed)
Subjective:    Patient ID: MALETA PACHA, female    DOB: [birth year]   MRN: [id number]  HPI #Smoker Smoker - STarted age 68. 25 pack per day. Quit end 2012 for first time with help of wellbutrin and patch.  But recently 7 year old granddaughter from congential liver disease ("cirrhosis") moved back with her and since then smoking. Patient is primary health care provider because her daughter and 61 year old do not get along.  Smokes 1 ppd (used to smoke 2 ppd)  #Chronic cough  - Stop ACE inhibitor fall 2013  #Lung nodule - 61m RLL March 2014  -  CT scan chest 08/19/2012: 865mnoncalcified right lower lobe nodule with irregular margins.  - PET scan 09/20/12 - indeterminate uptake   #COPD Gold stage III PFT 12/10/2011  - Gold stage 3 copd - fev1 0.8L/42%, 20% BD respinse. TLC 133%, DLCO 46% - Walk test in 2013   - pulse ox 88% at first lap 185 feet but increased to 91% at 3rd lap end  #AECOPD  - OV 05/12/2012 - treated with Levaquin and prednisone - March 2014: Failed aecopd office  treatment on 08/17/2012 and hospitalized between 08/19/2012 in 08/21/2012; discharged on 10 day prednisone taper    OV 08/27/2012  COPD: Failed aecopd office  treatment on 08/17/2012 and hospitalized between 08/19/2012 in 08/21/2012; discharged on 10 day prednisone taper. Currently feeling like she is slowly limping back to normal. FEels she needs another week off; STil lwith fatigue  New RLL nodule 60m260mdiagnosed while in hospital for aecopd.  She does not believe this is potentially lung cancer. She believes she was told `nothing to worry about` but she understands need for followup. She is not interested in intervetnion curently    REC  COPD COPD - Glad you slowly getting better after recent flareup - Continue the medications and advice of the hospitalist who treated you - Continue your regular medications for COPD - Take another week off and slowly work on gaining her strength back - When  financially possible I would recommend you take oxygen at night - When time allows I would recommend you to pulmonary rehabilitation - Start N. acetylcysteine 600 mg twice a day    #Lung nodule - Do PET scan into 10 weeks  #Smoking - Really important to quit smoking for all the various reasons as discussed  #Followup - 10 weeks with pet scan results   OV 01/06/2013 Followup for the above.  -In terms of lung nodule: We will make sure there is a followup scan of the chest in mid August 2014  - In terms of COPD: Details of symptoms are listed in the table below. COPD cat score is 23 and is around baseline but she still has significant symptoms. I noticed that she is on multiple different inhalers and has poor understanding of these. She is actually taking Combivent, Advair and Spiriva. I tried explained the differences between Spiriva and Combivent and why she should not overlap these inhalers but she got upset that is to restrict her inhaler use her logic is that when she takes Combivent it helps immediately and that Spiriva is only once a day and she does not seem to get a huge benefit from it. When I suggested that she switch Advair twice a day  to Brio once a day she got upset thinking that in switching twice a day inhaler which is superior because it is twice a day to once a day  inhaler which is potentially inferior because it once a day  Terms of smoking: She continues to smoke but acknowledges that it is important for to quit but really has no plan or does not want help rec #COPD  - stop combivent as needed. Instead take pro-air as needed  - stop advair. Instead take BREO 1 puff at night  - continue spiriva  - Please take prednisone 40 mg x1 day, then 30 mg x1 day, then 20 mg x1 day, then 10 mg x1 day, and then 5 mg x1 day and stop    #Lung nodule - Do CT scan chest mid august 2014; Weill ensure there is order  #Smoking - Really important to quit smoking for all the various  reasons as discussed  #Followup - 4 weeks ith CAT score and CT results  - see NP Tammy for followup     01/26/2013 f/u ov/Jerry Clyne re abn ct chest/still smoking  Chief Complaint  Patient presents with  . Acute Visit    Pt states having slight increased SOB and hemoptysis x 5 days. She states recently dxed with PNA and has not started on tx for this yet.   at baseline says sputum yellow to green each am x years then abruptly Started feeling 8/4 weak, more sob, felt hots/ sweats/ abruptly worse 8/5 with pain R post chest and then bloody sputum on 8/9.  Variable dysphagia, sticks in throat. No obvious asp events Had dental work 2 weeks before onset  Imp ? Asp pna sup segment RLL  rec augmentin 875 twice daily x 10 days The key is to stop smoking completely before smoking completely stops you!   02/11/2013 f/u ov/Vilma Will  Chief Complaint  Patient presents with  . Follow-up    Pt reports breathing back at her baseline. Her cough has improved with no more hemoptysis and occ white sputum.   Maint on spiriva/ advair and rare need for combivent, doe with more than slow adls rec The key is to stop smoking completely before smoking completely stops you!  Only use your albuterol (proaire) as a rescue medication to be used if you can't catch your breath by resting or doing a relaxed purse lip breathing pattern. The less you use it, the better it will work when you need it.  See Ramaswamy in 3 months with a follow cxr > did not return as rec    06/17/2013 f/u ov/Julya Alioto re: aecopd Chief Complaint  Patient presents with  . Follow-up    Increased SOB, cough and wheezing x1 week   zpak called in on 06/13/13 and mucus turned white but still very congested cough. Using advair/ spiriva as maint and now increase need for duoneb up to twice daily  rec Prednisone 10 mg take  4 each am x 2 days, 3 x 2 days,   2 each am x 2 days,  1 each am x 2 days and stop  Only use your albuterol (proaire) as a rescue  medication  For cough >  mucinex or mucinex dm 1200 mg every 12 hours   09/26/2013 f/u ov/Ariah Mower re: still smoking plus advair/ spiriva very confused with timing of maint and prns Chief Complaint  Patient presents with  . Follow-up    Breathing unchanged since the last visit. She also c/o chest tightness for the past 3 days. Cough is prod with large amounts of clear sputum. She uses rescue inhaler approx 2 times per wk on average and also using duoneb a  few times per day.   doe x > room to room walking/ not using mucinex as rec, no purulent sputum Chest tightness better p saba  >>pred taper   10/11/2013 Follow up  Returns for follow up for recent COPD flare.  Was given steroid taper last ov. Feeling much better.  Reports breathing is improved since last ov, Doing well with the Symbicort.  no new complaints. Discussed smoking cessatioin  No fever, chest pain, orthopnea, or edema  CXR last ov showed improved right suprahilar infiltrate.  rec Continue on Symbicort and Spiriva , rinse after use.  MOST IMPORTANT GOAL IS TO QUIT SMOKING.  follow up Dr. Melvyn Novas  In 3 months and As needed      01/16/2014 f/u ov/Chamille Werntz re: GOLD III COPD / insurance requiring trial of incruse over spiriva Chief Complaint  Patient presents with  . Follow-up    Pt states that overall her breathing is doing well. Some increased chest tightness last wk which she relates to humid weather- had to use rescue inhaler a few times last wk.    No obvious day to day or daytime variabilty or assoc chronic cough or cp or chest tightness, subjective wheeze overt sinus or hb symptoms. No unusual exp hx or h/o childhood pna/ asthma or knowledge of premature birth.  Sleeping ok without nocturnal  or early am exacerbation  of respiratory  c/o's or need for noct saba. Also denies any obvious fluctuation of symptoms with weather or environmental changes or other aggravating or alleviating factors except as outlined above   Current  Medications, Allergies, Complete Past Medical History, Past Surgical History, Family History, and Social History were reviewed in [employer] record.  ROS  The following are not active complaints unless bolded sore throat, dysphagia, dental problems, itching, sneezing,  nasal congestion or excess/ purulent secretions, ear ache,   fever, chills, sweats, unintended wt loss, pleuritic or exertional cp, hemoptysis,  orthopnea pnd or leg swelling, presyncope, palpitations, heartburn, abdominal pain, anorexia, nausea, vomiting, diarrhea  or change in bowel or urinary habits, change in stools or urine, dysuria,hematuria,  rash, arthralgias, visual complaints, headache, numbness weakness or ataxia or problems with walking or coordination,  change in mood/affect or memory.                  Objective:   Physical Exam   Chronically ill amb thin wf   09/26/2013        101>102 10/11/2013 > 01/16/2014  99   HEENT mild turbinate edema.  Oropharynx no thrush or excess pnd or cobblestoning.  No JVD or cervical adenopathy. Mild accessory muscle hypertrophy. Trachea midline, nl thryroid. Chest was hyperinflated by percussion with diminished breath sounds, no wheezing . Hoover sign positive at mid inspiration. Regular rate and rhythm without murmur gallop or rub or increase P2 or edema.  Abd: no hsm, nl excursion. Ext warm without cyanosis or clubbing.           CXR  09/26/2013 : Resolving right suprahilar infiltrate.   COPD and chronic interstitial fibrosis.        Assessment & Plan:

## 2014-01-16 NOTE — Assessment & Plan Note (Addendum)
>   3 min discussion  I emphasized that although we never turn away smokers from the pulmonary clinic, we do ask that they understand that the recommendations that we make  won't work nearly as well in the presence of continued cigarette exposure.  In fact, we may very well  reach a point where we can't promise to help the patient if he/she can't quit smoking. (We can and will promise to try to help, we just can't promise what we recommend will really work)   Discussed electronic cig trial as probably the less of two evils

## 2014-02-01 ENCOUNTER — Telehealth: Payer: Self-pay | Admitting: Internal Medicine

## 2014-02-01 MED ORDER — UMECLIDINIUM BROMIDE 62.5 MCG/INH IN AEPB: 1.0000 | INHALATION_SPRAY | Freq: Every morning | RESPIRATORY_TRACT | Status: DC

## 2014-02-01 NOTE — Telephone Encounter (Signed)
Called and spoke with pt and she is needing refill of the incruse.  This has been sent to her pharmacy and pt is aware.

## 2014-03-08 ENCOUNTER — Other Ambulatory Visit: Payer: Self-pay | Admitting: *Deleted

## 2014-03-28 ENCOUNTER — Telehealth: Payer: Self-pay | Admitting: Internal Medicine

## 2014-03-28 NOTE — Telephone Encounter (Signed)
Called and spoke with pt and she stated that she has an old rx for the wellbutrin.  She stated that she did not want to use these, but she needs this called in for her to stop smoking.  She is aware that MW is not back in the office tomorrow.    She stated that her insurance will no longer cover the symbicort or the spiriva.  She also wanted to see if MW wanted to change this as well.  She did make appt for November.  MW please advise. Thanks  No Known Allergies  Current Outpatient Prescriptions on File Prior to Visit  Medication Sig Dispense Refill  . albuterol (PROVENTIL HFA;VENTOLIN HFA) 108 (90 BASE) MCG/ACT inhaler Inhale 2 puffs into the lungs every 4 (four) hours as needed for wheezing or shortness of breath.      . budesonide-formoterol (SYMBICORT) 160-4.5 MCG/ACT inhaler Take 2 puffs first thing in am and then another 2 puffs about 12 hours later.  1 Inhaler  5  . diltiazem (TIAZAC) 300 MG 24 hr capsule Take 300 mg by mouth daily.      . Garlic 10 MG CAPS Take 1 tablet by mouth daily.      Marland Kitchen ipratropium-albuterol (DUONEB) 0.5-2.5 (3) MG/3ML SOLN Take 3 mLs by nebulization every 6 (six) hours as needed (FOR WHEEZING OR SHORTNESS OF BREATH).      Marland Kitchen losartan (COZAAR) 50 MG tablet take 1 tablet by mouth once daily  30 tablet  11  . SPIRIVA HANDIHALER 18 MCG inhalation capsule Inhaler contents of 1 capsule daily      . vitamin C (ASCORBIC ACID) 500 MG tablet Take 500 mg by mouth daily.      . vitamin E 400 UNIT capsule Take 400 Units by mouth daily.       No current facility-administered medications on file prior to visit.

## 2014-03-28 NOTE — Telephone Encounter (Signed)
Too many changes to work out over the phone - will need to come to office with her drug formulary and let us pick reasonable choices she can afford as alternatives

## 2014-03-29 NOTE — Telephone Encounter (Signed)
Pt returning call.[name] ° °

## 2014-03-29 NOTE — Telephone Encounter (Signed)
LMTCB x 1 

## 2014-03-29 NOTE — Telephone Encounter (Signed)
Spoke with pt - Pt scheduled to see MW on 04/06/14 at 3:15 pm.  Pt confirmed appt and voiced no further questions or concerns at this time.  She is aware to bring drug formulary to visit.

## 2014-04-06 ENCOUNTER — Ambulatory Visit (INDEPENDENT_AMBULATORY_CARE_PROVIDER_SITE_OTHER): Payer: 59 | Admitting: Internal Medicine

## 2014-04-06 ENCOUNTER — Encounter: Payer: Self-pay | Admitting: Internal Medicine

## 2014-04-06 VITALS — BP 112/70 | HR 82 | Temp 98.7°F | Ht 62.5 in | Wt 103.0 lb

## 2014-04-06 DIAGNOSIS — J449 Chronic obstructive pulmonary disease, unspecified: Secondary | ICD-10-CM

## 2014-04-06 DIAGNOSIS — Z720 Tobacco use: Secondary | ICD-10-CM

## 2014-04-06 DIAGNOSIS — F17200 Nicotine dependence, unspecified, uncomplicated: Secondary | ICD-10-CM

## 2014-04-06 NOTE — Progress Notes (Signed)
Subjective:    Patient ID: [name], female    DOB: [birth year]   MRN: [id number]  HPI #Smoker Smoker - STarted age 68. 25 pack per day. Quit end 2012 for first time with help of wellbutrin and patch.  But recently 7 year old granddaughter from congential liver disease ("cirrhosis") moved back with her and since then smoking. Patient is primary health care provider because her daughter and 61 year old do not get along.  Smokes 1 ppd (used to smoke 2 ppd)  #Chronic cough  - Stop ACE inhibitor fall 2013  #Lung nodule - 61m RLL March 2014  -  CT scan chest 08/19/2012: 865mnoncalcified right lower lobe nodule with irregular margins.  - PET scan 09/20/12 - indeterminate uptake   #COPD Gold stage III PFT 12/10/2011  - Gold stage 3 copd - fev1 0.8L/42%, 20% BD respinse. TLC 133%, DLCO 46% - Walk test in 2013   - pulse ox 88% at first lap 185 feet but increased to 91% at 3rd lap end  #AECOPD  - OV 05/12/2012 - treated with Levaquin and prednisone - March 2014: Failed aecopd office  treatment on 08/17/2012 and hospitalized between 08/19/2012 in 08/21/2012; discharged on 10 day prednisone taper    OV 08/27/2012  COPD: Failed aecopd office  treatment on 08/17/2012 and hospitalized between 08/19/2012 in 08/21/2012; discharged on 10 day prednisone taper. Currently feeling like she is slowly limping back to normal. FEels she needs another week off; STil lwith fatigue  New RLL nodule 60m260mdiagnosed while in hospital for aecopd.  She does not believe this is potentially lung cancer. She believes she was told `nothing to worry about` but she understands need for followup. She is not interested in intervetnion curently    REC  COPD COPD - Glad you slowly getting better after recent flareup - Continue the medications and advice of the hospitalist who treated you - Continue your regular medications for COPD - Take another week off and slowly work on gaining her strength back - When  financially possible I would recommend you take oxygen at night - When time allows I would recommend you to pulmonary rehabilitation - Start N. acetylcysteine 600 mg twice a day    #Lung nodule - Do PET scan into 10 weeks  #Smoking - Really important to quit smoking for all the various reasons as discussed  #Followup - 10 weeks with pet scan results   OV 01/06/2013 Followup for the above.  -In terms of lung nodule: We will make sure there is a followup scan of the chest in mid August 2014  - In terms of COPD: Details of symptoms are listed in the table below. COPD cat score is 23 and is around baseline but she still has significant symptoms. I noticed that she is on multiple different inhalers and has poor understanding of these. She is actually taking Combivent, Advair and Spiriva. I tried explained the differences between Spiriva and Combivent and why she should not overlap these inhalers but she got upset that is to restrict her inhaler use her logic is that when she takes Combivent it helps immediately and that Spiriva is only once a day and she does not seem to get a huge benefit from it. When I suggested that she switch Advair twice a day  to Brio once a day she got upset thinking that in switching twice a day inhaler which is superior because it is twice a day to once a day  inhaler which is potentially inferior because it once a day  Terms of smoking: She continues to smoke but acknowledges that it is important for to quit but really has no plan or does not want help rec #COPD  - stop combivent as needed. Instead take pro-air as needed  - stop advair. Instead take BREO 1 puff at night  - continue spiriva  - Please take prednisone 40 mg x1 day, then 30 mg x1 day, then 20 mg x1 day, then 10 mg x1 day, and then 5 mg x1 day and stop    #Lung nodule - Do CT scan chest mid august 2014; Weill ensure there is order  #Smoking - Really important to quit smoking for all the various  reasons as discussed  #Followup - 4 weeks ith CAT score and CT results  - see NP Tammy for followup     01/26/2013 f/u ov/[name] re abn ct chest/still smoking  Chief Complaint  Patient presents with  . Acute Visit    Pt states having slight increased SOB and hemoptysis x 5 days. She states recently dxed with PNA and has not started on tx for this yet.   at baseline says sputum yellow to green each am x years then abruptly Started feeling 8/4 weak, more sob, felt hots/ sweats/ abruptly worse 8/5 with pain R post chest and then bloody sputum on 8/9.  Variable dysphagia, sticks in throat. No obvious asp events Had dental work 2 weeks before onset  Imp ? Asp pna sup segment RLL  rec augmentin 875 twice daily x 10 days The key is to stop smoking completely before smoking completely stops you!   02/11/2013 f/u ov/[name]  Chief Complaint  Patient presents with  . Follow-up    Pt reports breathing back at her baseline. Her cough has improved with no more hemoptysis and occ white sputum.   Maint on spiriva/ advair and rare need for combivent, doe with more than slow adls rec The key is to stop smoking completely before smoking completely stops you!  Only use your albuterol (proaire) as a rescue medication to be used if you can't catch your breath by resting or doing a relaxed purse lip breathing pattern. The less you use it, the better it will work when you need it.  See Ramaswamy in 3 months with a follow cxr > did not return as rec    06/17/2013 f/u ov/[name] re: aecopd Chief Complaint  Patient presents with  . Follow-up    Increased SOB, cough and wheezing x1 week   zpak called in on 06/13/13 and mucus turned white but still very congested cough. Using advair/ spiriva as maint and now increase need for duoneb up to twice daily  rec Prednisone 10 mg take  4 each am x 2 days, 3 x 2 days,   2 each am x 2 days,  1 each am x 2 days and stop  Only use your albuterol (proaire) as a rescue  medication  For cough >  mucinex or mucinex dm 1200 mg every 12 hours   09/26/2013 f/u ov/[name] re: still smoking plus advair/ spiriva very confused with timing of maint and prns Chief Complaint  Patient presents with  . Follow-up    Breathing unchanged since the last visit. She also c/o chest tightness for the past 3 days. Cough is prod with large amounts of clear sputum. She uses rescue inhaler approx 2 times per wk on average and also using duoneb a  few times per day.   doe x > room to room walking/ not using mucinex as rec, no purulent sputum Chest tightness better p saba  >>pred taper   10/11/2013 Follow up  Returns for follow up for recent COPD flare.  Was given steroid taper last ov. Feeling much better.  Reports breathing is improved since last ov, Doing well with the Symbicort.  no new complaints. Discussed smoking cessatioin  No fever, chest pain, orthopnea, or edema  CXR last ov showed improved right suprahilar infiltrate.  rec Continue on Symbicort and Spiriva , rinse after use.  MOST IMPORTANT GOAL IS TO QUIT SMOKING.  follow up Dr. Melvyn Novas  In 3 months and As needed      01/16/2014 f/u ov/[name] re: GOLD III COPD / insurance requiring trial of incruse over spiriva Chief Complaint  Patient presents with  . Follow-up    Pt states that overall her breathing is doing well. Some increased chest tightness last wk which she relates to humid weather- had to use rescue inhaler a few times last wk.   rec Add incruse 2 puffs off one click each am only,  And only after the am  symbicort Work on inhaler technique:    Prevnar given today.    04/05/2014 f/u ov/[name] re: copd GOLD III still smoking  Chief Complaint  Patient presents with  . Follow-up    Breathing is about the same, has good days and bad days.  Using albuterol inhaler about once per day.      continues to struggle with understanding insurance restrictions / alternatives  No obvious day to day or daytime variabilty or  assoc excess or purulent sputum  or cp or chest tightness, subjective wheeze overt sinus or hb symptoms. No unusual exp hx or h/o childhood pna/ asthma or knowledge of premature birth.  Sleeping ok without nocturnal  or early am exacerbation  of respiratory  c/o's or need for noct saba. Also denies any obvious fluctuation of symptoms with weather or environmental changes or other aggravating or alleviating factors except as outlined above   Current Medications, Allergies, Complete Past Medical History, Past Surgical History, Family History, and Social History were reviewed in [employer] record.  ROS  The following are not active complaints unless bolded sore throat, dysphagia, dental problems, itching, sneezing,  nasal congestion or excess/ purulent secretions, ear ache,   fever, chills, sweats, unintended wt loss, pleuritic or exertional cp, hemoptysis,  orthopnea pnd or leg swelling, presyncope, palpitations, heartburn, abdominal pain, anorexia, nausea, vomiting, diarrhea  or change in bowel or urinary habits, change in stools or urine, dysuria,hematuria,  rash, arthralgias, visual complaints, headache, numbness weakness or ataxia or problems with walking or coordination,  change in mood/affect or memory.                  Objective:   Physical Exam   Chronically ill amb thin wf   09/26/2013        101>102 10/11/2013 > 01/16/2014  99 > 04/06/2014  103    HEENT mild turbinate edema.  Oropharynx no thrush or excess pnd or cobblestoning.  No JVD or cervical adenopathy. Mild accessory muscle hypertrophy. Trachea midline, nl thryroid. Chest was hyperinflated by percussion with diminished breath sounds, no wheezing . Hoover sign positive at mid inspiration. Regular rate and rhythm without murmur gallop or rub or increase P2 or edema.  Abd: no hsm, nl excursion. Ext warm without cyanosis or clubbing.  CXR  09/26/2013 : Resolving right suprahilar infiltrate.    COPD and chronic interstitial fibrosis.        Assessment & Plan:

## 2014-04-06 NOTE — Patient Instructions (Addendum)
BREO one pff each am replaces symbicort Tudorza one twice daily replaces spiriva  Let us know alternatives if can't afford these medications under your insurance plan - you can always make an appointment with Tammy NP to sort out your issues with medications and get short term samples   wellbutrin 150 mg daily   The key is to stop smoking completely before smoking completely stops you!  Please schedule a follow up visit in 3 months but call sooner if needed

## 2014-04-09 MED ORDER — ACLIDINIUM BROMIDE 400 MCG/ACT IN AEPB: 1.0000 | INHALATION_SPRAY | Freq: Two times a day (BID) | RESPIRATORY_TRACT | Status: DC

## 2014-04-09 MED ORDER — FLUTICASONE FUROATE-VILANTEROL 100-25 MCG/INH IN AEPB: 1.0000 | INHALATION_SPRAY | Freq: Every morning | RESPIRATORY_TRACT | Status: DC

## 2014-04-09 NOTE — Assessment & Plan Note (Signed)

## 2014-04-09 NOTE — Assessment & Plan Note (Signed)
pfts 11/2011  0.86 (42%) ratio 35  -- 01/16/2014  p extensive coaching HFA effectiveness =    75% but 100% with dpi   Despite smoking Adequate control on present rx, reviewed > no change in rx needed    I had an extended discussion with the patient today lasting 15 to 20 minutes of a 25 minute visit on the following issues:  She continues to fail to understand how her insurance formulary/restrictions work and has not been provided (she says) with a printed form showing options.  Offered for her to use my phone to call them while she's here and let me speak to a person with her insurance representative but she declined.    Therefore for now rec samples and try breo/ [place]

## 2014-04-12 ENCOUNTER — Telehealth: Payer: Self-pay | Admitting: Internal Medicine

## 2014-04-12 NOTE — Telephone Encounter (Signed)
symbicort is listed in copd and asthma - she is looking in the wrong category as the problem is copd and the alternatives are asthma drugs and are not interchangeable in copd.

## 2014-04-12 NOTE — Telephone Encounter (Signed)
Called and spoke with pt and she stated that she called the insurance company and this is what she found out:  For the symbicort:  Tier 1 medications are:  Alvesco,asmanex and qvar. For the spiriva:  Tier 2  Is the [place] which she is already on and she stated that this is fine.    Pt wanted to see if MW recs for any change in the symbicort.  Please advise. Thanks  No Known Allergies  Current Outpatient Prescriptions on File Prior to Visit  Medication Sig Dispense Refill  . Aclidinium Bromide (TUDORZA PRESSAIR) 400 MCG/ACT AEPB Inhale 1 puff into the lungs 2 (two) times daily. One twice daily  1 each  11  . albuterol (PROVENTIL HFA;VENTOLIN HFA) 108 (90 BASE) MCG/ACT inhaler Inhale 2 puffs into the lungs every 4 (four) hours as needed for wheezing or shortness of breath.      . diltiazem (TIAZAC) 300 MG 24 hr capsule Take 300 mg by mouth daily.      . Fluticasone Furoate-Vilanterol (BREO ELLIPTA) 100-25 MCG/INH AEPB Inhale 1 puff into the lungs every morning.  28 each  11  . Garlic 10 MG CAPS Take 1 tablet by mouth daily.      Marland Kitchen ipratropium-albuterol (DUONEB) 0.5-2.5 (3) MG/3ML SOLN Take 3 mLs by nebulization every 6 (six) hours as needed (FOR WHEEZING OR SHORTNESS OF BREATH).      Marland Kitchen losartan (COZAAR) 50 MG tablet take 1 tablet by mouth once daily  30 tablet  11  . vitamin C (ASCORBIC ACID) 500 MG tablet Take 500 mg by mouth daily.      . vitamin E 400 UNIT capsule Take 400 Units by mouth daily.       No current facility-administered medications on file prior to visit.

## 2014-04-13 NOTE — Telephone Encounter (Signed)
LMTCB

## 2014-04-13 NOTE — Telephone Encounter (Signed)
Called and spoke pt. Informed pt of the recs per MW. Pt verbalized understanding and denied any further concerns at this time. Pt stated she would call back her insurance company and the pharmacy to shed light on what meds are covered under what tier. Nothing further at this time is needed.

## 2014-04-17 ENCOUNTER — Ambulatory Visit: Payer: 59 | Admitting: Internal Medicine

## 2014-06-26 ENCOUNTER — Telehealth: Payer: Self-pay | Admitting: Internal Medicine

## 2014-06-26 MED ORDER — AZITHROMYCIN 250 MG PO TABS: ORAL_TABLET | ORAL | Status: DC

## 2014-06-26 MED ORDER — PREDNISONE 10 MG PO TABS: ORAL_TABLET | ORAL | Status: DC

## 2014-06-26 NOTE — Telephone Encounter (Signed)
zpak Prednisone 10 mg take  4 each am x 2 days,   2 each am x 2 days,  1 each am x 2 days and stop  Ov by end of week to see TammyNP if not better and f/u with me due by 07/07/14 even if is better

## 2014-06-26 NOTE — Telephone Encounter (Signed)
Called pt and is aware of recs. RX's sent in. Nothing further needed

## 2014-06-26 NOTE — Telephone Encounter (Signed)
Last OV 04-06-14. I spoke with the pt and she is c/o having increased SOB with rest and activity. She states SOB is better with rest but she is still more SOB then usual. Albuterol inhaelr does not help much. Pt states she feels like she has pneumonia.  She is also c/o having increased chest congestion and productive cough with thick white phlegm. All symptoms have been x 4 days. There are no available appts today and I was not sure the pt should wait until tomorrow for treatment. There are appts available tomorrow.  Please advise. [place] Bing, CMA  No Known Allergies

## 2014-06-30 ENCOUNTER — Ambulatory Visit (INDEPENDENT_AMBULATORY_CARE_PROVIDER_SITE_OTHER): Payer: Medicare Other | Admitting: Adult Health

## 2014-06-30 ENCOUNTER — Telehealth: Payer: Self-pay | Admitting: Internal Medicine

## 2014-06-30 ENCOUNTER — Encounter: Payer: Self-pay | Admitting: Adult Health

## 2014-06-30 ENCOUNTER — Ambulatory Visit (INDEPENDENT_AMBULATORY_CARE_PROVIDER_SITE_OTHER)
Admission: RE | Admit: 2014-06-30 | Discharge: 2014-06-30 | Disposition: A | Discharge status: Home / Self Care | Payer: Medicare Other | Source: Ambulatory Visit | Attending: Adult Health | Admitting: Adult Health

## 2014-06-30 VITALS — BP 118/72 | HR 87 | Temp 98.4°F | Ht 62.5 in | Wt 105.6 lb

## 2014-06-30 DIAGNOSIS — J441 Chronic obstructive pulmonary disease with (acute) exacerbation: Secondary | ICD-10-CM

## 2014-06-30 DIAGNOSIS — J9610 Chronic respiratory failure, unspecified whether with hypoxia or hypercapnia: Secondary | ICD-10-CM | POA: Insufficient documentation

## 2014-06-30 DIAGNOSIS — J9611 Chronic respiratory failure with hypoxia: Secondary | ICD-10-CM | POA: Insufficient documentation

## 2014-06-30 DIAGNOSIS — J9621 Acute and chronic respiratory failure with hypoxia: Secondary | ICD-10-CM

## 2014-06-30 NOTE — Assessment & Plan Note (Signed)
Ambulatory desats - will begin O2 with act  Cont on nocturnal O2   Plan  Check cxr today  Begin O2 at 2l/m with activity and continue on O2 At bedtime  .  Mucinex DM Twice daily  As needed  Cough/congestion  Follow up Dr. Melvyn Novas  In 6 weeks and As needed   Please contact office for sooner follow up if symptoms do not improve or worsen or seek emergency care

## 2014-06-30 NOTE — Patient Instructions (Addendum)
Prednisone taper over next week  [employer] .  Chest xray today .  Begin O2 at 2l/m with activity and continue on O2 At bedtime  .  Mucinex DM Twice daily  As needed  Cough/congestion  Follow up Dr. Melvyn Novas  In 6 weeks and As needed   Please contact office for sooner follow up if symptoms do not improve or worsen or seek emergency care

## 2014-06-30 NOTE — Assessment & Plan Note (Signed)
Flare  Check xray   Plan  Prednisone taper over next week  Finish ZPack .  Chest xray today .  Begin O2 at 2l/m with activity and continue on O2 At bedtime  .  Mucinex DM Twice daily  As needed  Cough/congestion  Follow up Dr. Melvyn Novas  In 6 weeks and As needed   Please contact office for sooner follow up if symptoms do not improve or worsen or seek emergency care

## 2014-06-30 NOTE — Telephone Encounter (Signed)
Pt seen today by TP for acute visit She is nearly done with her Symbicort - insurance no longer covers this med but she was able to receive an inhaler from her pharmacy Once this inhaler is completed, she will need a covered alternative  Per the 10.22.15 ov w/ MW, pt was to be changed to [facility] However pt called back shortly after, and provided covered alternatives Asmanex, Alvesco and QVAR.  MW stated these are listed under asthma and pt was to call her insurance and call the office back.  This was never done.  I called her pharmacy plan thru [facility] and spoke with rep Daisy who reported pt's covered alternatives to Symbicort are QVAR and Asmanex.  Memory Dance is listed as a Tier 3 medication.  MW please advise on your recs.  Would like to do the prior auth for the Symbicort?  Thank you.

## 2014-06-30 NOTE — Progress Notes (Signed)
Subjective:    Patient ID: [name], female    DOB: [birth year]   MRN: [id number]  HPI #Smoker Smoker - STarted age 70. 25 pack per day. Quit end 2012 for first time with help of wellbutrin and patch.  But recently 7 year old granddaughter from congential liver disease ("cirrhosis") moved back with her and since then smoking. Patient is primary health care provider because her daughter and 61 year old do not get along.  Smokes 1 ppd (used to smoke 2 ppd)  #Chronic cough  - Stop ACE inhibitor fall 2013  #Lung nodule - 61m RLL March 2014  -  CT scan chest 08/19/2012: 865mnoncalcified right lower lobe nodule with irregular margins.  - PET scan 09/20/12 - indeterminate uptake   #COPD Gold stage III PFT 12/10/2011  - Gold stage 3 copd - fev1 0.8L/42%, 20% BD respinse. TLC 133%, DLCO 46% - Walk test in 2013   - pulse ox 88% at first lap 185 feet but increased to 91% at 3rd lap end  #AECOPD  - OV 05/12/2012 - treated with Levaquin and prednisone - March 2014: Failed aecopd office  treatment on 08/17/2012 and hospitalized between 08/19/2012 in 08/21/2012; discharged on 10 day prednisone taper    OV 08/27/2012  COPD: Failed aecopd office  treatment on 08/17/2012 and hospitalized between 08/19/2012 in 08/21/2012; discharged on 10 day prednisone taper. Currently feeling like she is slowly limping back to normal. FEels she needs another week off; STil lwith fatigue  New RLL nodule 60m260mdiagnosed while in hospital for aecopd.  She does not believe this is potentially lung cancer. She believes she was told `nothing to worry about` but she understands need for followup. She is not interested in intervetnion curently    REC  COPD COPD - Glad you slowly getting better after recent flareup - Continue the medications and advice of the hospitalist who treated you - Continue your regular medications for COPD - Take another week off and slowly work on gaining her strength back - When  financially possible I would recommend you take oxygen at night - When time allows I would recommend you to pulmonary rehabilitation - Start N. acetylcysteine 600 mg twice a day    #Lung nodule - Do PET scan into 10 weeks  #Smoking - Really important to quit smoking for all the various reasons as discussed  #Followup - 10 weeks with pet scan results   OV 01/06/2013 Followup for the above.  -In terms of lung nodule: We will make sure there is a followup scan of the chest in mid August 2014  - In terms of COPD: Details of symptoms are listed in the table below. COPD cat score is 23 and is around baseline but she still has significant symptoms. I noticed that she is on multiple different inhalers and has poor understanding of these. She is actually taking Combivent, Advair and Spiriva. I tried explained the differences between Spiriva and Combivent and why she should not overlap these inhalers but she got upset that is to restrict her inhaler use her logic is that when she takes Combivent it helps immediately and that Spiriva is only once a day and she does not seem to get a huge benefit from it. When I suggested that she switch Advair twice a day  to Brio once a day she got upset thinking that in switching twice a day inhaler which is superior because it is twice a day to once a day  inhaler which is potentially inferior because it once a day  Terms of smoking: She continues to smoke but acknowledges that it is important for to quit but really has no plan or does not want help rec #COPD  - stop combivent as needed. Instead take pro-air as needed  - stop advair. Instead take BREO 1 puff at night  - continue spiriva  - Please take prednisone 40 mg x1 day, then 30 mg x1 day, then 20 mg x1 day, then 10 mg x1 day, and then 5 mg x1 day and stop    #Lung nodule - Do CT scan chest mid august 2014; Weill ensure there is order  #Smoking - Really important to quit smoking for all the various  reasons as discussed  #Followup - 4 weeks ith CAT score and CT results  - see NP Tammy for followup     01/26/2013 f/u ov/Wert re abn ct chest/still smoking  Chief Complaint  Patient presents with  . Acute Visit    Pt states having slight increased SOB and hemoptysis x 5 days. She states recently dxed with PNA and has not started on tx for this yet.   at baseline says sputum yellow to green each am x years then abruptly Started feeling 8/4 weak, more sob, felt hots/ sweats/ abruptly worse 8/5 with pain R post chest and then bloody sputum on 8/9.  Variable dysphagia, sticks in throat. No obvious asp events Had dental work 2 weeks before onset  Imp ? Asp pna sup segment RLL  rec augmentin 875 twice daily x 10 days The key is to stop smoking completely before smoking completely stops you!   02/11/2013 f/u ov/Wert  Chief Complaint  Patient presents with  . Follow-up    Pt reports breathing back at her baseline. Her cough has improved with no more hemoptysis and occ white sputum.   Maint on spiriva/ advair and rare need for combivent, doe with more than slow adls rec The key is to stop smoking completely before smoking completely stops you!  Only use your albuterol (proaire) as a rescue medication to be used if you can't catch your breath by resting or doing a relaxed purse lip breathing pattern. The less you use it, the better it will work when you need it.  See Ramaswamy in 3 months with a follow cxr > did not return as rec    06/17/2013 f/u ov/Wert re: aecopd Chief Complaint  Patient presents with  . Follow-up    Increased SOB, cough and wheezing x1 week   zpak called in on 06/13/13 and mucus turned white but still very congested cough. Using advair/ spiriva as maint and now increase need for duoneb up to twice daily  rec Prednisone 10 mg take  4 each am x 2 days, 3 x 2 days,   2 each am x 2 days,  1 each am x 2 days and stop  Only use your albuterol (proaire) as a rescue  medication  For cough >  mucinex or mucinex dm 1200 mg every 12 hours   09/26/2013 f/u ov/Wert re: still smoking plus advair/ spiriva very confused with timing of maint and prns Chief Complaint  Patient presents with  . Follow-up    Breathing unchanged since the last visit. She also c/o chest tightness for the past 3 days. Cough is prod with large amounts of clear sputum. She uses rescue inhaler approx 2 times per wk on average and also using duoneb a  few times per day.   doe x > room to room walking/ not using mucinex as rec, no purulent sputum Chest tightness better p saba  >>pred taper   10/11/2013 Follow up  Returns for follow up for recent COPD flare.  Was given steroid taper last ov. Feeling much better.  Reports breathing is improved since last ov, Doing well with the Symbicort.  no new complaints. Discussed smoking cessatioin  No fever, chest pain, orthopnea, or edema  CXR last ov showed improved right suprahilar infiltrate.  rec Continue on Symbicort and Spiriva , rinse after use.  MOST IMPORTANT GOAL IS TO QUIT SMOKING.  follow up Dr. Melvyn Novas  In 3 months and As needed      01/16/2014 f/u ov/Wert re: GOLD III COPD / insurance requiring trial of incruse over spiriva Chief Complaint  Patient presents with  . Follow-up    Pt states that overall her breathing is doing well. Some increased chest tightness last wk which she relates to humid weather- had to use rescue inhaler a few times last wk.   rec Add incruse 2 puffs off one click each am only,  And only after the am  symbicort Work on inhaler technique:    Prevnar given today.    04/05/2014 f/u ov/Wert re: copd GOLD III still smoking  Chief Complaint  Patient presents with  . Follow-up    Breathing is about the same, has good days and bad days.  Using albuterol inhaler about once per day.      continues to struggle with understanding insurance restrictions / alternatives >>wellbutrin rx , tiral of BREO instead of  symbicort   06/30/2014 Acute OV  Complains of cough, congestion, wheezing and tightness for 1 week. Was called in [place] and pred taper on 1/11. Says she is some better but still wheezing. Did notice O2 sats dropped 80s when walking initially.  Today in ofifce O2 sats at rest >90% on RA, walking dropped to , on 2l/m kept sats >90%  She is on O2 at home  At bedtime   No chest pain, orthopnea, edema or fever. Remains on Symbicort, did not like BREO .  Still smoking .   Current Medications, Allergies, Complete Past Medical History, Past Surgical History, Family History, and Social History were reviewed in [employer] record.  ROS  The following are not active complaints unless bolded sore throat, dysphagia, dental problems, itching, sneezing,  nasal congestion or excess/ purulent secretions, ear ache,   fever, chills, sweats, unintended wt loss, pleuritic or exertional cp, hemoptysis,  orthopnea pnd or leg swelling, presyncope, palpitations, heartburn, abdominal pain, anorexia, nausea, vomiting, diarrhea  or change in bowel or urinary habits, change in stools or urine, dysuria,hematuria,  rash, arthralgias, visual complaints, headache, numbness weakness or ataxia or problems with walking or coordination,  change in mood/affect or memory.                  Objective:   Physical Exam   Chronically ill amb thin wf   09/26/2013        101>102 10/11/2013 > 01/16/2014  99 > 04/06/2014  103 >105 06/30/2014    HEENT mild turbinate edema.  Oropharynx no thrush or excess pnd or cobblestoning.  No JVD or cervical adenopathy. Mild accessory muscle hypertrophy. Trachea midline, nl thryroid. Chest was hyperinflated by percussion with diminished breath sounds, no wheezing . Marland Kitchen Regular rate and rhythm without murmur gallop or rub or increase P2 or edema.  Abd: no hsm, nl excursion. Ext warm without cyanosis or clubbing.     CXR  09/26/2013 : Resolving right suprahilar infiltrate.   COPD  and chronic interstitial fibrosis.        Assessment & Plan:

## 2014-06-30 NOTE — Addendum Note (Signed)
Addended by: Parke Poisson E on: 06/30/2014 05:33 PM   Modules accepted: Orders

## 2014-07-01 NOTE — Telephone Encounter (Signed)
Those are alternatives for asthma, not copd, which is what she's documented to have.  I think we're looking at the wrong place in her formulary but if that's what it says then yes we need to do a PA

## 2014-07-03 NOTE — Telephone Encounter (Addendum)
Called and spoke to [facility] to initiate PA for symbicort. PA form faxed to [place] fax. Form completed and faxed back to [facility]. Pt's ID#: [id number]  Called and spoke to pt. Informed pt of the PA for symbicort. Pt verbalized understanding. Will send to [facility] to follow up on once sick message below is addressed.  -------------   Pt also stated during call, that a pred taper was supposed to be sent on 06/30/14 when seen by TP was not sent. Pt is also requesting results of CXR.  TP please advise the directions of pred taper and CXR results. Thanks.

## 2014-07-03 NOTE — Telephone Encounter (Signed)
Pt states she was to have a rx for prednisone called in on Friday. It was never called in and would like to talk to nurse about this. Please call back at [phone]

## 2014-07-03 NOTE — Telephone Encounter (Signed)
Received PA form. Faxed completed PA form for Symbicort back to [facility]. Will await a decision.   Will send to [facility] to follow PA.

## 2014-07-04 MED ORDER — PREDNISONE 10 MG PO TABS: ORAL_TABLET | ORAL | Status: DC

## 2014-07-04 NOTE — Telephone Encounter (Signed)
i spoke with pt and she stated that the pharmacy did not get her pred taper.  She was very upset about this.  I have called this to her pharmacy and she is aware and pt is also aware of cxr results per TP  Nothing further is needed.

## 2014-07-04 NOTE — Telephone Encounter (Signed)
Pred taper sent - LM to make pt aware  TP please advise on cxr results, thanks.

## 2014-07-07 ENCOUNTER — Ambulatory Visit: Payer: 59 | Admitting: Internal Medicine

## 2014-07-18 ENCOUNTER — Telehealth: Payer: Self-pay | Admitting: Internal Medicine

## 2014-07-18 MED ORDER — DOXYCYCLINE HYCLATE 100 MG PO TABS: 100.0000 mg | ORAL_TABLET | Freq: Two times a day (BID) | ORAL | Status: DC

## 2014-07-18 MED ORDER — PREDNISONE 10 MG PO TABS: ORAL_TABLET | ORAL | Status: DC

## 2014-07-18 NOTE — Telephone Encounter (Signed)
Doxy 100 mg bid x 10 days Prednisone 10 mg take  4 each am x 2 days,   2 each am x 2 days,  1 each am x 2 days and stop F/u with me in 2 weeks even if better as these flares are happening too often

## 2014-07-18 NOTE — Telephone Encounter (Signed)
Spoke to pt and advised of Dr Gustavus Bryant recommendations.  Rx sent to pharmacy.  Pt will keep f/u with Dr Melvyn Novas.

## 2014-07-18 NOTE — Telephone Encounter (Signed)
Spoke with pt. Reports cough and congestion x2 days. Cough is productive of green mucus. SOB, wheezing and low grade fever are also present. Has tried taking Sudafed and Vitamin C with minimal relief. Would like something called in.  MW  - please advise. Thanks.

## 2014-07-29 ENCOUNTER — Ambulatory Visit (INDEPENDENT_AMBULATORY_CARE_PROVIDER_SITE_OTHER): Payer: Medicare Other | Admitting: Nurse Practitioner

## 2014-07-29 ENCOUNTER — Encounter: Payer: Self-pay | Admitting: Nurse Practitioner

## 2014-07-29 VITALS — BP 130/80 | HR 110 | Temp 98.5°F | Ht 62.0 in | Wt 103.0 lb

## 2014-07-29 DIAGNOSIS — J441 Chronic obstructive pulmonary disease with (acute) exacerbation: Secondary | ICD-10-CM

## 2014-07-29 MED ORDER — PREDNISONE 10 MG PO TABS: ORAL_TABLET | ORAL | Status: DC

## 2014-07-29 NOTE — Progress Notes (Signed)
Pre visit review using our clinic review tool, if applicable. No additional management support is needed unless otherwise documented below in the visit note.

## 2014-07-29 NOTE — Patient Instructions (Signed)
Please start prednisosne.  Please increase fluids to hydrate secretions so you have more productive cough.  Continue mucinex & inhalers as discussed.  Use albuterol ned at least once daily for 3-4 days followed by coughing exercises.  Please see Dr Melvyn Novas in 1 week.

## 2014-07-29 NOTE — Progress Notes (Signed)
Subjective:     [name] is a 69 y.o. female who presents for evaluation of productive cough, wheezing and SOB. Symptoms began 4 weeks ago. Symptoms have been gradually improving since that time. Past history is significant for COPD. Pt was seen by pulm 4 weeks ago & treated w/azithromycin & prednisone. She completed both courses, with little improvement. She called pulm offc: Dr Melvyn Novas started 10 days doxy & another pred taper & instructed to f/u in ofc in 2 weeks.  Pt completed steroid 6 da, completed doxy yesterday. She states she is better-not as SOB, doing more ADLs without SOB. Last Sunday she was SOB w/dressing. Yesterday, she was able to dress & do light housework. She has been OOW since 2/4. She hopes to go back on Monday. She is concerned that she is still coughing thick white mucous. She sd there has been no color change since she 1st got sick 4 weeks ago.  She is using albuterol neb 1 to 2 times daily, mucinex BID, symbicort bid, tudorza bid. She denies fever, nausea. Energy is getting better. She had diarrhea last 2 days-likely from ABX.  The following portions of the patient's history were reviewed and updated as appropriate: allergies, current medications, past medical history, past social history, past surgical history and problem list.  Review of Systems Pertinent items are noted in HPI.    Objective:    BP 130/80 mmHg  Pulse 110  Temp(Src) 98.5 F (36.9 C) (Oral)  Ht 5' 2" (1.575 m)  Wt 103 lb (46.72 kg)  BMI 18.83 kg/m2  SpO2 93% General appearance: alert, cooperative, appears stated age and no distress Head: Normocephalic, without obvious abnormality, atraumatic Eyes: negative findings: lids and lashes normal and conjunctivae and sclerae normal Lungs: long exp phase, bilat wheezes at end of expiration, moves air in all lung fields Heart: mild tachycardia, rate reg, no murmur    Assessment:Plan     1. COPD exacerbation Improving, not at baseline No Abx  today-just finished doxy yesterday, had azithro 4 weeks ago. Having some diarrhea. - predniSONE (DELTASONE) 10 MG tablet; Take 4 tabs daily x 2 days, 2 tabs daily x 2 days, 1 tab daily x  2days then stop  Dispense: 14 tablet; Refill: 0 Increase fluids F/u w/Dr Melvyn Novas in 1 week

## 2014-08-11 ENCOUNTER — Ambulatory Visit (INDEPENDENT_AMBULATORY_CARE_PROVIDER_SITE_OTHER): Payer: 59 | Admitting: Internal Medicine

## 2014-08-11 ENCOUNTER — Encounter: Payer: Self-pay | Admitting: Internal Medicine

## 2014-08-11 VITALS — BP 132/70 | HR 94 | Ht 62.0 in | Wt 101.2 lb

## 2014-08-11 DIAGNOSIS — F17210 Nicotine dependence, cigarettes, uncomplicated: Secondary | ICD-10-CM

## 2014-08-11 DIAGNOSIS — J449 Chronic obstructive pulmonary disease, unspecified: Secondary | ICD-10-CM

## 2014-08-11 DIAGNOSIS — Z720 Tobacco use: Secondary | ICD-10-CM

## 2014-08-11 NOTE — Patient Instructions (Addendum)
No change in medications  Ok to just use the 02 at bedtime and as needed during the day   Please schedule a follow up visit in 3 months but call sooner if needed

## 2014-08-11 NOTE — Progress Notes (Signed)
Subjective:    Patient ID: [name], female    DOB: May 06, 1946   MRN: [id number]  HPI #Smoker Smoker - STarted age 69. 15 pack per day. Quit end 2012 for first time with help of wellbutrin and patch.  But recently 82 year old granddaughter from congential liver disease ("cirrhosis") moved back with her and since then smoking. Patient is primary health care provider because her daughter and 55 year old do not get along.  Smokes 1 ppd (used to smoke 2 ppd)  #Chronic cough  - Stop ACE inhibitor fall 2013  #Lung nodule - 22m RLL March 2014  -  CT scan chest 08/19/2012: 814mnoncalcified right lower lobe nodule with irregular margins.  - PET scan 09/20/12 - indeterminate uptake   #COPD Gold stage III PFT 12/10/2011  - Gold stage 3 copd - fev1 0.8L/42%, 20% BD respinse. TLC 133%, DLCO 46% - Walk test in 2013   - pulse ox 88% at first lap 185 feet but increased to 91% at 3rd lap end  #AECOPD  - OV 05/12/2012 - treated with Levaquin and prednisone - March 2014: Failed aecopd office  treatment on 08/17/2012 and hospitalized between 08/19/2012 in 08/21/2012; discharged on 10 day prednisone taper    OV 08/27/2012  COPD: Failed aecopd office  treatment on 08/17/2012 and hospitalized between 08/19/2012 in 08/21/2012; discharged on 10 day prednisone taper. Currently feeling like she is slowly limping back to normal. FEels she needs another week off; STil lwith fatigue  New RLL nodule 77m75mdiagnosed while in hospital for aecopd.  She does not believe this is potentially lung cancer. She believes she was told `nothing to worry about` but she understands need for followup. She is not interested in intervetnion curently    REC  COPD COPD - Glad you slowly getting better after recent flareup - Continue the medications and advice of the hospitalist who treated you - Continue your regular medications for COPD - Take another week off and slowly work on gaining her strength back - When  financially possible I would recommend you take oxygen at night - When time allows I would recommend you to pulmonary rehabilitation - Start N. acetylcysteine 600 mg twice a day    #Lung nodule - Do PET scan into 10 weeks  #Smoking - Really important to quit smoking for all the various reasons as discussed  #Followup - 10 weeks with pet scan results   OV 01/06/2013 Followup for the above.  -In terms of lung nodule: We will make sure there is a followup scan of the chest in mid August 2014  - In terms of COPD: Details of symptoms are listed in the table below. COPD cat score is 23 and is around baseline but she still has significant symptoms. I noticed that she is on multiple different inhalers and has poor understanding of these. She is actually taking Combivent, Advair and Spiriva. I tried explained the differences between Spiriva and Combivent and why she should not overlap these inhalers but she got upset that is to restrict her inhaler use her logic is that when she takes Combivent it helps immediately and that Spiriva is only once a day and she does not seem to get a huge benefit from it. When I suggested that she switch Advair twice a day  to Brio once a day she got upset thinking that in switching twice a day inhaler which is superior because it is twice a day to once a day  inhaler which is potentially inferior because it once a day  Terms of smoking: She continues to smoke but acknowledges that it is important for to quit but really has no plan or does not want help rec #COPD  - stop combivent as needed. Instead take pro-air as needed  - stop advair. Instead take BREO 1 puff at night  - continue spiriva  - Please take prednisone 40 mg x1 day, then 30 mg x1 day, then 20 mg x1 day, then 10 mg x1 day, and then 5 mg x1 day and stop    #Lung nodule - Do CT scan chest mid august 2014; Weill ensure there is order  #Smoking - Really important to quit smoking for all the various  reasons as discussed  #Followup - 4 weeks ith CAT score and CT results  - see NP Tammy for followup     01/26/2013 f/u ov/Garrison Michie re abn ct chest/still smoking  Chief Complaint  Patient presents with  . Acute Visit    Pt states having slight increased SOB and hemoptysis x 5 days. She states recently dxed with PNA and has not started on tx for this yet.   at baseline says sputum yellow to green each am x years then abruptly Started feeling 8/4 weak, more sob, felt hots/ sweats/ abruptly worse 8/5 with pain R post chest and then bloody sputum on 8/9.  Variable dysphagia, sticks in throat. No obvious asp events Had dental work 2 weeks before onset  Imp ? Asp pna sup segment RLL  rec augmentin 875 twice daily x 10 days The key is to stop smoking completely before smoking completely stops you!   02/11/2013 f/u ov/[name]  Chief Complaint  Patient presents with  . Follow-up    Pt reports breathing back at her baseline. Her cough has improved with no more hemoptysis and occ white sputum.   Maint on spiriva/ advair and rare need for combivent, doe with more than slow adls rec The key is to stop smoking completely before smoking completely stops you!  Only use your albuterol (proaire) as a rescue medication to be used if you can't catch your breath by resting or doing a relaxed purse lip breathing pattern. The less you use it, the better it will work when you need it.  See Ramaswamy in 3 months with a follow cxr > did not return as rec    06/17/2013 f/u ov/[name] re: aecopd Chief Complaint  Patient presents with  . Follow-up    Increased SOB, cough and wheezing x1 week   zpak called in on 06/13/13 and mucus turned white but still very congested cough. Using advair/ spiriva as maint and now increase need for duoneb up to twice daily  rec Prednisone 10 mg take  4 each am x 2 days, 3 x 2 days,   2 each am x 2 days,  1 each am x 2 days and stop  Only use your albuterol (proaire) as a rescue  medication  For cough >  mucinex or mucinex dm 1200 mg every 12 hours   09/26/2013 f/u ov/[name] re: still smoking plus advair/ spiriva very confused with timing of maint and prns Chief Complaint  Patient presents with  . Follow-up    Breathing unchanged since the last visit. She also c/o chest tightness for the past 3 days. Cough is prod with large amounts of clear sputum. She uses rescue inhaler approx 2 times per wk on average and also using duoneb a  few times per day.   doe x > room to room walking/ not using mucinex as rec, no purulent sputum Chest tightness better p saba  >>pred taper   10/11/2013 Follow up  Returns for follow up for recent COPD flare.  Was given steroid taper last ov. Feeling much better.  Reports breathing is improved since last ov, Doing well with the Symbicort.  no new complaints. Discussed smoking cessatioin  No fever, chest pain, orthopnea, or edema  CXR last ov showed improved right suprahilar infiltrate.  rec Continue on Symbicort and Spiriva , rinse after use.  MOST IMPORTANT GOAL IS TO QUIT SMOKING.  follow up Dr. Melvyn Novas  In 3 months and As needed      01/16/2014 f/u ov/[name] re: GOLD III COPD / insurance requiring trial of incruse over spiriva Chief Complaint  Patient presents with  . Follow-up    Pt states that overall her breathing is doing well. Some increased chest tightness last wk which she relates to humid weather- had to use rescue inhaler a few times last wk.   rec Add incruse 2 puffs off one click each am only,  And only after the am  symbicort Work on inhaler technique:    Prevnar given today.    04/05/2014 f/u ov/[name] re: copd GOLD III still smoking  Chief Complaint  Patient presents with  . Follow-up    Breathing is about the same, has good days and bad days.  Using albuterol inhaler about once per day.      continues to struggle with understanding insurance restrictions / alternatives >>wellbutrin rx , tiral of BREO instead of  symbicort   06/30/2014 Acute OV  Complains of cough, congestion, wheezing and tightness for 1 week. Was called in Britt and pred taper on 1/11. Says she is some better but still wheezing. Did notice O2 sats dropped 80s when walking initially.  Today in ofifce O2 sats at rest >90% on RA, walking dropped to , on 2l/m kept sats >90%  She is on O2 at home  At bedtime   No chest pain, orthopnea, edema or fever. Remains on Symbicort, did not like BREO .  Still smoking . rec Prednisone taper over next week  Finish ZPack .  Chest xray today .  Begin O2 at 2l/m with activity and continue on O2 At bedtime  .  Mucinex DM Twice daily  As needed  Cough/congestion    08/11/2014 f/u ov/[name] re: GOLD III still smoking / spiriva/ symbicort  Chief Complaint  Patient presents with  . Follow-up    Pt states that her breathing is doing well.  Her cough has improved.   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 No unusual exp hx or h/o childhood pna/ asthma or knowledge of premature birth.  Sleeping ok without nocturnal  or early am exacerbation  of respiratory  c/o's or need for noct saba. Also denies any obvious fluctuation of symptoms with weather or environmental changes or other aggravating or alleviating factors except as outlined above   Current Medications, Allergies, Complete Past Medical History, Past Surgical History, Family History, and Social History were reviewed in [employer] record.  ROS  The following are not active complaints unless  bolded sore throat, dysphagia, dental problems, itching, sneezing,  nasal congestion or excess/ purulent secretions, ear ache,   fever, chills, sweats, unintended wt loss, pleuritic or exertional cp, hemoptysis,   orthopnea pnd or leg swelling, presyncope, palpitations, heartburn, abdominal pain, anorexia, nausea, vomiting, diarrhea  or change in bowel or urinary habits, change in stools or urine, dysuria,hematuria,  rash, arthralgias, visual complaints, headache, numbness weakness or ataxia or problems with walking or coordination,  change in mood/affect or memory.                          Objective:   Physical Exam   Chronically ill amb thin wf   09/26/2013        101>102 10/11/2013 > 01/16/2014  99 > 04/06/2014  103 >105 06/30/2014 > 08/11/2014  101    HEENT mild turbinate edema.  Oropharynx no thrush or excess pnd or cobblestoning.  No JVD or cervical adenopathy. Mild accessory muscle hypertrophy. Trachea midline, nl thryroid. Chest was hyperinflated by percussion with diminished breath sounds, no wheezing . Marland Kitchen Regular rate and rhythm without murmur gallop or rub or increase P2 or edema.  Abd: no hsm, nl excursion. Ext warm without cyanosis or clubbing.  MS  Marked crepitatance L shoulder unable to abduct to horizontal position even passively.    I personally reviewed images and agree with radiology impression as follows:  CXR: 06/30/14 COPD/chronic changes. No active disease           Assessment & Plan:

## 2014-08-12 NOTE — Assessment & Plan Note (Signed)
pfts 11/2011  0.86 (42%) ratio 35  -- 01/16/2014  p extensive coaching HFA effectiveness =    75% but 100% with dpi     Adequate control on present rx, reviewed > no change in rx needed, main challenge now is the smoking issue> no change in rx needed

## 2014-08-12 NOTE — Assessment & Plan Note (Addendum)
>   3 min discussion  I took an extended  opportunity with this patient to outline the consequences of continued cigarette use  in airway disorders based on all the data we have from the multiple national lung health studies (perfomed over decades at millions of dollars in cost)  indicating that smoking cessation, not choice of inhalers or physicians, is the most important aspect of care.    

## 2014-08-21 ENCOUNTER — Telehealth: Payer: Self-pay | Admitting: Internal Medicine

## 2014-08-21 NOTE — Telephone Encounter (Signed)
Spoke with pt, has MRI scheduled at 9:00 at [facility] [place], was told to call our office to see if she is ok to use inhalers tomorrow before MRI.  MW please advise.  Thanks!

## 2014-08-21 NOTE — Telephone Encounter (Signed)
sure

## 2014-08-21 NOTE — Telephone Encounter (Signed)
Called and spoke with pt and she is aware of MW recs to use her inhalers prior to her MRI.  Pt voiced her understanding and nothing further is needed.

## 2014-09-04 ENCOUNTER — Telehealth: Payer: Self-pay | Admitting: Internal Medicine

## 2014-09-04 NOTE — Telephone Encounter (Signed)
Magda Paganini - do you have the surgery clearance form for this patient?  I did not see it scanned into chart.

## 2014-09-06 NOTE — Telephone Encounter (Signed)
Spoke with Judeen Hammans. States that Dr. Percell Miller was able to read Dr. Gustavus Bryant handwriting. Nothing further was needed.

## 2014-09-06 NOTE — Telephone Encounter (Signed)
I just returned to the office today  I have reviewed MW lookat and do not have any forms on this pt

## 2014-10-02 ENCOUNTER — Other Ambulatory Visit: Payer: Self-pay | Admitting: Physician Assistant

## 2014-10-09 ENCOUNTER — Encounter (HOSPITAL_BASED_OUTPATIENT_CLINIC_OR_DEPARTMENT_OTHER): Payer: Self-pay | Admitting: *Deleted

## 2014-10-09 NOTE — Pre-Procedure Instructions (Signed)
To come for BMET. EKG and office note requested from Dr. Zenia Resides office.

## 2014-10-10 ENCOUNTER — Encounter (HOSPITAL_BASED_OUTPATIENT_CLINIC_OR_DEPARTMENT_OTHER): Payer: Self-pay | Admitting: *Deleted

## 2014-10-10 NOTE — Pre-Procedure Instructions (Signed)
EKG, office note and echo received from Dr. Zenia Resides office.

## 2014-10-11 ENCOUNTER — Encounter (HOSPITAL_BASED_OUTPATIENT_CLINIC_OR_DEPARTMENT_OTHER)
Admission: RE | Admit: 2014-10-11 | Discharge: 2014-10-11 | Disposition: A | Discharge status: Home / Self Care | Payer: 59 | Source: Ambulatory Visit | Attending: Orthopedic Surgery | Admitting: Orthopedic Surgery

## 2014-10-11 DIAGNOSIS — J449 Chronic obstructive pulmonary disease, unspecified: Secondary | ICD-10-CM | POA: Diagnosis not present

## 2014-10-11 DIAGNOSIS — Y929 Unspecified place or not applicable: Secondary | ICD-10-CM | POA: Diagnosis not present

## 2014-10-11 DIAGNOSIS — M85822 Other specified disorders of bone density and structure, left upper arm: Secondary | ICD-10-CM | POA: Diagnosis not present

## 2014-10-11 DIAGNOSIS — M7542 Impingement syndrome of left shoulder: Secondary | ICD-10-CM | POA: Diagnosis not present

## 2014-10-11 DIAGNOSIS — M75122 Complete rotator cuff tear or rupture of left shoulder, not specified as traumatic: Secondary | ICD-10-CM | POA: Diagnosis present

## 2014-10-11 DIAGNOSIS — X58XXXA Exposure to other specified factors, initial encounter: Secondary | ICD-10-CM | POA: Diagnosis not present

## 2014-10-11 DIAGNOSIS — Z87891 Personal history of nicotine dependence: Secondary | ICD-10-CM | POA: Diagnosis not present

## 2014-10-11 LAB — BASIC METABOLIC PANEL
Anion gap: 9 (ref 5–15)
BUN: 14 mg/dL (ref 6–23)
CALCIUM: 9.8 mg/dL (ref 8.4–10.5)
CO2: 24 mmol/L (ref 19–32)
CREATININE: 0.56 mg/dL (ref 0.50–1.10)
Chloride: 105 mmol/L (ref 96–112)
GFR calc Af Amer: >90 mL/min (ref >90)
GFR calc non Af Amer: >90 mL/min (ref >90)
Glucose, Bld: 85 mg/dL (ref 70–99)
Potassium: 4.6 mmol/L (ref 3.5–5.1)
Sodium: 138 mmol/L (ref 135–145)

## 2014-10-11 NOTE — H&P (Signed)
[facility] Stefanko/[facility] [address] [place], [place] [address] [phone] A Division of [facility]  Ninetta Lights, M.D.   Robert A. Noemi Chapel, M.D.   Faythe Casa, M.D.   Johnny Bridge, M.D.   Almedia Balls, M.D. Ernesta Amble. Percell Miller, M.D.  Joseph Pierini, M.D.  Lanier Prude, M.D.    Verner Chol, M.D. Lovett Calender, PA- C  Mary L. Venida Jarvis, PA-C  Kirstin A. Shepperson, PA-C  Josh [place], PA-C  [place], [place]  RE: Crystal, [name]    [id number]   DOB: 11/23/1940 PROGRESS NOTE: 08-29-14  HPI: [name] comes in for followup, although this is her first visit to see me.  Attritional complete rotator cuff tear.  Although it has been documented as such previously, she does recall a specific traumatic event occurring 08/02/2014.  This is when her shoulder was wrenched awkwardly by a door she was trying to close.  Since then has been intolerable.  She has been out of work since injury.  Rest pain.  Night pain.  Her workup with x-rays revealing just a little bit of superior migration.  Some degenerative changes in the [facility] joint.  Type 2 to 3 acromion.  Still reasonable subacromial space.  MRI complete retracted rotator cuff tear supraspinatus tendon.  Retracted more than 2 cm.  Despite this, there is only a minimal amount of atrophy in her supraspinatus.  That would suggest this is more of an acute injury, rather than a chronic retracted attritional tear.  It also presented in the category where repair may be possible.  She certainly has sufficient symptoms to warrant treatment, based on functional impact, pain and discomfort.    History, workup and treatment today reviewed.  She has more than moderate COPD followed by Dr. Norberto Sorenson.  She also has a cardiac history that she sees her cardiologist for regularly, although I do not have records for that regard. That is Dr. Cheryll Cockayne.   Past Medical, Family and Social History are  reviewed in detail on patient questionnaire and signed. Review of Systems as detailed in HPI; all others reviewed and are negative.    EXAMINATION: Well-developed, well-nourished white female in no acute distress.  Alert and oriented x 3.  Specifically, she has a little bit of shortness of breath at rest.  In regard to her left shoulder, I can get just about full active and passive motion.  Obvious cuff weakness, positive impingement, positive palms down abduction.  Little sore AC joint.  No instability.    DISPOSITION: I have had a long and thorough discussion with her about what she has.  I have talked about the job she does, which is long-standing and involves a fair amount of use of her left shoulder.  We have also talked about her age.  We have talked about her underlying pulmonary and cardiac history.  The best approach from my point of view is early intervention.  Exam under anesthesia, arthroscopy, decompression and hopefully rotator cuff repair.  The procedure, risks, benefits, and complications were reviewed.  Having her up here, able to be accomplished, and hold up over time remains suspect based on her age, but we need to do something.  We are going to obviously have clearance from a pulmonary and cardiac point of view.  Also in the long run she really needs to entertain looking at different employment.  More than 25 minutes were spent face-to-face covering all of this  with her.  Workup is complete.    Continued  [name]/[facility] [address] [place], [place] [address] [phone] A Division of [facility]  Ninetta Lights, M.D.   Robert A. Noemi Chapel, M.D.   Faythe Casa, M.D.   Johnny Bridge, M.D.   Almedia Balls, M.D. Ernesta Amble. Percell Miller, M.D.  Joseph Pierini, M.D.  Lanier Prude, M.D.    Verner Chol, M.D. Lovett Calender, PA- C  Mary L. Venida Jarvis, PA-C  Kirstin A. Shepperson, PA-C  Josh La [place], PA-C   [place], [place]  RE: [name], [name]    [id number]   DOB:  PROGRESS NOTE: 08-29-14  Page 2   I will see her at the time of intervention if we can get clearance.  I have given her a prescription for Percocet to use sparingly before surgery.  Also I gave her a note to be out of work from here until 4 months to allow enough time for repair, recovery and rehab.   Ninetta Lights, M.D.  Electronically verified by Ninetta Lights, M.D.  Crystal KirschnerMeriel Flavors D 08-29-14 T 09-04-14

## 2014-10-12 ENCOUNTER — Ambulatory Visit (HOSPITAL_BASED_OUTPATIENT_CLINIC_OR_DEPARTMENT_OTHER)
Admission: RE | Admit: 2014-10-12 | Discharge: 2014-10-12 | Disposition: A | Discharge status: Home / Self Care | Payer: 59 | Source: Ambulatory Visit | Attending: Orthopedic Surgery | Admitting: Orthopedic Surgery

## 2014-10-12 ENCOUNTER — Encounter (HOSPITAL_BASED_OUTPATIENT_CLINIC_OR_DEPARTMENT_OTHER)
Admission: RE | Disposition: A | Discharge status: Home / Self Care | Payer: Self-pay | Source: Ambulatory Visit | Attending: Orthopedic Surgery

## 2014-10-12 ENCOUNTER — Encounter (HOSPITAL_BASED_OUTPATIENT_CLINIC_OR_DEPARTMENT_OTHER): Payer: Self-pay | Admitting: *Deleted

## 2014-10-12 ENCOUNTER — Ambulatory Visit (HOSPITAL_BASED_OUTPATIENT_CLINIC_OR_DEPARTMENT_OTHER): Payer: 59 | Admitting: Certified Registered"

## 2014-10-12 DIAGNOSIS — M75122 Complete rotator cuff tear or rupture of left shoulder, not specified as traumatic: Secondary | ICD-10-CM | POA: Diagnosis not present

## 2014-10-12 DIAGNOSIS — Y929 Unspecified place or not applicable: Secondary | ICD-10-CM | POA: Insufficient documentation

## 2014-10-12 DIAGNOSIS — J449 Chronic obstructive pulmonary disease, unspecified: Secondary | ICD-10-CM | POA: Insufficient documentation

## 2014-10-12 DIAGNOSIS — M85822 Other specified disorders of bone density and structure, left upper arm: Secondary | ICD-10-CM | POA: Insufficient documentation

## 2014-10-12 DIAGNOSIS — X58XXXA Exposure to other specified factors, initial encounter: Secondary | ICD-10-CM | POA: Insufficient documentation

## 2014-10-12 DIAGNOSIS — Z87891 Personal history of nicotine dependence: Secondary | ICD-10-CM | POA: Insufficient documentation

## 2014-10-12 DIAGNOSIS — M7542 Impingement syndrome of left shoulder: Secondary | ICD-10-CM | POA: Insufficient documentation

## 2014-10-12 HISTORY — DX: Reserved for inherently not codable concepts without codable children: IMO0001

## 2014-10-12 HISTORY — DX: Presence of dental prosthetic device (complete) (partial): Z97.2

## 2014-10-12 HISTORY — PX: SHOULDER ARTHROSCOPY WITH ROTATOR CUFF REPAIR AND SUBACROMIAL DECOMPRESSION: SHX5686

## 2014-10-12 HISTORY — DX: Dependence on supplemental oxygen: Z99.81

## 2014-10-12 HISTORY — DX: Changes in skin texture: R23.4

## 2014-10-12 HISTORY — PX: RESECTION DISTAL CLAVICAL: SHX5053

## 2014-10-12 HISTORY — DX: Essential (primary) hypertension: I10

## 2014-10-12 LAB — POCT HEMOGLOBIN-HEMACUE: HEMOGLOBIN: 13.4 g/dL (ref 12.0–15.0)

## 2014-10-12 SURGERY — SHOULDER ARTHROSCOPY WITH ROTATOR CUFF REPAIR AND SUBACROMIAL DECOMPRESSION
Anesthesia: Regional | Site: Shoulder | Laterality: Left

## 2014-10-12 MED ORDER — CHLORHEXIDINE GLUCONATE 4 % EX LIQD: 60.0000 mL | Freq: Once | CUTANEOUS | Status: DC

## 2014-10-12 MED ORDER — SUCCINYLCHOLINE CHLORIDE 20 MG/ML IJ SOLN
INTRAMUSCULAR | Status: DC | PRN
  Administered 2014-10-12: 30 mg via INTRAVENOUS

## 2014-10-12 MED ORDER — LIDOCAINE HCL (CARDIAC) 20 MG/ML IV SOLN
INTRAVENOUS | Status: DC | PRN
  Administered 2014-10-12: 40 mg via INTRAVENOUS

## 2014-10-12 MED ORDER — SODIUM CHLORIDE 0.9 % IR SOLN
Status: DC | PRN
  Administered 2014-10-12: 9000 mL

## 2014-10-12 MED ORDER — EPHEDRINE SULFATE 50 MG/ML IJ SOLN
INTRAMUSCULAR | Status: DC | PRN
  Administered 2014-10-12 (×2): 10 mg via INTRAVENOUS

## 2014-10-12 MED ORDER — DEXAMETHASONE SODIUM PHOSPHATE 4 MG/ML IJ SOLN
INTRAMUSCULAR | Status: DC | PRN
  Administered 2014-10-12: 10 mg via INTRAVENOUS

## 2014-10-12 MED ORDER — ONDANSETRON HCL 4 MG PO TABS: 4.0000 mg | ORAL_TABLET | Freq: Three times a day (TID) | ORAL | Status: DC | PRN

## 2014-10-12 MED ORDER — PROPOFOL 10 MG/ML IV BOLUS
INTRAVENOUS | Status: DC | PRN
  Administered 2014-10-12: 100 mg via INTRAVENOUS

## 2014-10-12 MED ORDER — MIDAZOLAM HCL 2 MG/2ML IJ SOLN
1.0000 mg | INTRAMUSCULAR | Status: DC | PRN
  Administered 2014-10-12: 1 mg via INTRAVENOUS

## 2014-10-12 MED ORDER — ONDANSETRON HCL 4 MG/2ML IJ SOLN
INTRAMUSCULAR | Status: DC | PRN
Start: 2014-10-12 — End: 2014-10-12
  Administered 2014-10-12: 4 mg via INTRAVENOUS

## 2014-10-12 MED ORDER — OXYCODONE-ACETAMINOPHEN 5-325 MG PO TABS: 1.0000 | ORAL_TABLET | ORAL | Status: DC | PRN

## 2014-10-12 MED ORDER — GLYCOPYRROLATE 0.2 MG/ML IJ SOLN: 0.2000 mg | Freq: Once | INTRAMUSCULAR | Status: DC | PRN

## 2014-10-12 MED ORDER — OXYCODONE HCL 5 MG PO TABS: 5.0000 mg | ORAL_TABLET | Freq: Once | ORAL | Status: DC | PRN

## 2014-10-12 MED ORDER — FENTANYL CITRATE (PF) 100 MCG/2ML IJ SOLN: 25.0000 ug | INTRAMUSCULAR | Status: DC | PRN

## 2014-10-12 MED ORDER — ROPIVACAINE HCL 5 MG/ML IJ SOLN
INTRAMUSCULAR | Status: DC | PRN
  Administered 2014-10-12: 20 mL via PERINEURAL

## 2014-10-12 MED ORDER — LACTATED RINGERS IV SOLN
INTRAVENOUS | Status: DC
  Administered 2014-10-12 (×2): via INTRAVENOUS

## 2014-10-12 MED ORDER — OXYCODONE HCL 5 MG/5ML PO SOLN: 5.0000 mg | Freq: Once | ORAL | Status: DC | PRN

## 2014-10-12 MED ORDER — FENTANYL CITRATE (PF) 100 MCG/2ML IJ SOLN
INTRAMUSCULAR | Status: AC
  Filled 2014-10-12: qty 2

## 2014-10-12 MED ORDER — LACTATED RINGERS IV SOLN: INTRAVENOUS | Status: DC

## 2014-10-12 MED ORDER — CEFAZOLIN SODIUM-DEXTROSE 2-3 GM-% IV SOLR
2.0000 g | INTRAVENOUS | Status: AC
  Administered 2014-10-12: 2 g via INTRAVENOUS

## 2014-10-12 MED ORDER — MIDAZOLAM HCL 2 MG/ML PO SYRP: 12.0000 mg | ORAL_SOLUTION | Freq: Once | ORAL | Status: DC | PRN

## 2014-10-12 MED ORDER — FENTANYL CITRATE (PF) 100 MCG/2ML IJ SOLN
INTRAMUSCULAR | Status: AC
  Filled 2014-10-12: qty 6

## 2014-10-12 MED ORDER — FENTANYL CITRATE (PF) 100 MCG/2ML IJ SOLN
50.0000 ug | INTRAMUSCULAR | Status: DC | PRN
  Administered 2014-10-12: 50 ug via INTRAVENOUS

## 2014-10-12 MED ORDER — MIDAZOLAM HCL 2 MG/2ML IJ SOLN
INTRAMUSCULAR | Status: AC
  Filled 2014-10-12: qty 2

## 2014-10-12 SURGICAL SUPPLY — 76 items
ANCH SUT SWLK 19.1X5.5 CLS EL (Anchor) ×3 IMPLANT
ANCHOR PEEK SWIVEL LOCK 5.5 (Anchor) ×6 IMPLANT
APL SKNCLS STERI-STRIP NONHPOA (GAUZE/BANDAGES/DRESSINGS)
BENZOIN TINCTURE PRP APPL 2/3 (GAUZE/BANDAGES/DRESSINGS) IMPLANT
BLADE CUTTER GATOR 3.5 (BLADE) ×3 IMPLANT
BLADE CUTTER MENIS 5.5 (BLADE) IMPLANT
BLADE GREAT WHITE 4.2 (BLADE) ×2 IMPLANT
BLADE GREAT WHITE 4.2MM (BLADE) ×1
BLADE SURG 15 STRL LF DISP TIS (BLADE) ×1 IMPLANT
BLADE SURG 15 STRL SS (BLADE) ×3
BUR OVAL 6.0 (BURR) ×3 IMPLANT
CANNULA DRY DOC 8X75 (CANNULA) ×2 IMPLANT
CANNULA TWIST IN 8.25X7CM (CANNULA) IMPLANT
CLOSURE WOUND 1/2 X4 (GAUZE/BANDAGES/DRESSINGS)
DECANTER SPIKE VIAL GLASS SM (MISCELLANEOUS) IMPLANT
DRAPE STERI 35X30 U-POUCH (DRAPES) ×3 IMPLANT
DRAPE U-SHAPE 47X51 STRL (DRAPES) ×3 IMPLANT
DRAPE U-SHAPE 76X120 STRL (DRAPES) ×6 IMPLANT
DRSG PAD ABDOMINAL 8X10 ST (GAUZE/BANDAGES/DRESSINGS) ×3 IMPLANT
DURAPREP 26ML APPLICATOR (WOUND CARE) ×3 IMPLANT
ELECT MENISCUS 165MM 90D (ELECTRODE) ×3 IMPLANT
ELECT REM PT RETURN 9FT ADLT (ELECTROSURGICAL) ×3
ELECTRODE REM PT RTRN 9FT ADLT (ELECTROSURGICAL) ×1 IMPLANT
GAUZE SPONGE 4X4 12PLY STRL (GAUZE/BANDAGES/DRESSINGS) ×6 IMPLANT
GAUZE XEROFORM 1X8 LF (GAUZE/BANDAGES/DRESSINGS) ×3 IMPLANT
GLOVE BIOGEL PI IND STRL 7.0 (GLOVE) ×1 IMPLANT
GLOVE BIOGEL PI INDICATOR 7.0 (GLOVE) ×4
GLOVE ECLIPSE 6.5 STRL STRAW (GLOVE) ×2 IMPLANT
GLOVE ECLIPSE 7.0 STRL STRAW (GLOVE) ×3 IMPLANT
GLOVE EXAM NITRILE LRG STRL (GLOVE) ×2 IMPLANT
GLOVE ORTHO TXT STRL SZ7.5 (GLOVE) ×3 IMPLANT
GLOVE SURG ORTHO 8.0 STRL STRW (GLOVE) ×3 IMPLANT
GOWN STRL REUS W/ TWL LRG LVL3 (GOWN DISPOSABLE) ×2 IMPLANT
GOWN STRL REUS W/ TWL XL LVL3 (GOWN DISPOSABLE) ×1 IMPLANT
GOWN STRL REUS W/TWL LRG LVL3 (GOWN DISPOSABLE) ×6
GOWN STRL REUS W/TWL XL LVL3 (GOWN DISPOSABLE) ×3
IV NS IRRIG 3000ML ARTHROMATIC (IV SOLUTION) ×12 IMPLANT
MANIFOLD NEPTUNE II (INSTRUMENTS) ×3 IMPLANT
NDL SCORPION MULTI FIRE (NEEDLE) IMPLANT
NDL SUT 6 .5 CRC .975X.05 MAYO (NEEDLE) IMPLANT
NEEDLE MAYO TAPER (NEEDLE)
NEEDLE SCORPION MULTI FIRE (NEEDLE) ×3 IMPLANT
NS IRRIG 1000ML POUR BTL (IV SOLUTION) IMPLANT
PACK ARTHROSCOPY DSU (CUSTOM PROCEDURE TRAY) ×3 IMPLANT
PACK BASIN DAY SURGERY FS (CUSTOM PROCEDURE TRAY) ×3 IMPLANT
PASSER SUT SWANSON 36MM LOOP (INSTRUMENTS) IMPLANT
PENCIL BUTTON HOLSTER BLD 10FT (ELECTRODE) ×3 IMPLANT
SET ARTHROSCOPY TUBING (MISCELLANEOUS) ×3
SET ARTHROSCOPY TUBING LN (MISCELLANEOUS) ×1 IMPLANT
SLEEVE SCD COMPRESS KNEE MED (MISCELLANEOUS) ×2 IMPLANT
SLING ARM IMMOBILIZER LRG (SOFTGOODS) IMPLANT
SLING ARM IMMOBILIZER MED (SOFTGOODS) IMPLANT
SLING ARM LRG ADULT FOAM STRAP (SOFTGOODS) IMPLANT
SLING ARM MED ADULT FOAM STRAP (SOFTGOODS) IMPLANT
SLING ARM XL FOAM STRAP (SOFTGOODS) IMPLANT
SLING ULTRA II SMALL (SOFTGOODS) ×2 IMPLANT
SPONGE LAP 4X18 X RAY DECT (DISPOSABLE) ×2 IMPLANT
STRIP CLOSURE SKIN 1/2X4 (GAUZE/BANDAGES/DRESSINGS) IMPLANT
SUCTION FRAZIER TIP 10 FR DISP (SUCTIONS) ×2 IMPLANT
SUT ETHIBOND 2 OS 4 DA (SUTURE) IMPLANT
SUT ETHILON 2 0 FS 18 (SUTURE) IMPLANT
SUT ETHILON 3 0 PS 1 (SUTURE) ×2 IMPLANT
SUT FIBERWIRE #2 38 T-5 BLUE (SUTURE)
SUT RETRIEVER MED (INSTRUMENTS) IMPLANT
SUT TIGER TAPE 7 IN WHITE (SUTURE) ×4 IMPLANT
SUT VIC AB 0 CT1 27 (SUTURE)
SUT VIC AB 0 CT1 27XBRD ANBCTR (SUTURE) IMPLANT
SUT VIC AB 2-0 SH 27 (SUTURE)
SUT VIC AB 2-0 SH 27XBRD (SUTURE) IMPLANT
SUT VIC AB 3-0 FS2 27 (SUTURE) IMPLANT
SUTURE FIBERWR #2 38 T-5 BLUE (SUTURE) IMPLANT
TAPE FIBER 2MM 7IN #2 BLUE (SUTURE) ×2 IMPLANT
TOWEL OR 17X24 6PK STRL BLUE (TOWEL DISPOSABLE) ×3 IMPLANT
TOWEL OR NON WOVEN STRL DISP B (DISPOSABLE) ×1 IMPLANT
WATER STERILE IRR 1000ML POUR (IV SOLUTION) ×3 IMPLANT
YANKAUER SUCT BULB TIP NO VENT (SUCTIONS) IMPLANT

## 2014-10-12 NOTE — Interval H&P Note (Signed)
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 After consideration of risks, benefits and other options for treatment, the patient has consented to  Procedure(s): LEFT SHOULDER ARTHROSCOPY, DEBRIDEMENT WITH DISTAL CLAVICLE EXCISION, ACROMIOPLASTY,  ROTATOR CUFF REPAIR AND BICEPS TENODESIS (Left) as a surgical intervention .  The patient's history has been reviewed, patient examined, no change in status, stable for surgery.  I have reviewed the patient's chart and labs.  Questions were answered to the patient's satisfaction.     MURPHY,DANIEL F

## 2014-10-12 NOTE — Anesthesia Procedure Notes (Addendum)
Anesthesia Regional Block:  Interscalene brachial plexus block  Pre-Anesthetic Checklist: ,, timeout performed, Correct Patient, Correct Site, Correct Laterality, Correct Procedure, Correct Position, site marked, Risks and benefits discussed,  Surgical consent,  Pre-op evaluation,  At surgeon's request and post-op pain management  Laterality: Upper and Left  Prep: chloraprep       Needles:  Injection technique: Single-shot  Needle Type: Echogenic Stimulator Needle          Additional Needles:  Procedures: ultrasound guided (picture in chart) and nerve stimulator Interscalene brachial plexus block  Nerve Stimulator or Paresthesia:  Response: deltoid, 0.5 mA,   Additional Responses:   Narrative:  Injection made incrementally with aspirations every 5 mL.  Performed by: Personally  Anesthesiologist: MOSER, CHRIS  Additional Notes: H+P and labs reviewed, risks and benefits discussed with patient, procedure tolerated well without complications   Procedure Name: Intubation Date/Time: 10/12/2014 9:45 AM Performed by: Efrem Pitstick D Pre-anesthesia Checklist: Patient identified, Emergency Drugs available, Suction available and Patient being monitored Patient Re-evaluated:Patient Re-evaluated prior to inductionOxygen Delivery Method: Circle System Utilized Preoxygenation: Pre-oxygenation with 100% oxygen Intubation Type: IV induction Ventilation: Mask ventilation without difficulty Laryngoscope Size: Mac and 3 Grade View: Grade II Tube type: Oral Number of attempts: 1 Airway Equipment and Method: Stylet and Oral airway Placement Confirmation: ETT inserted through vocal cords under direct vision,  positive ETCO2 and breath sounds checked- equal and bilateral Secured at: 21 cm Tube secured with: Tape Dental Injury: Teeth and Oropharynx as per pre-operative assessment

## 2014-10-12 NOTE — Anesthesia Postprocedure Evaluation (Signed)
  Anesthesia Post-op Note  Patient: [name]  Procedure(s) Performed: Procedure(s): LEFT SHOULDER ARTHROSCOPY, DEBRIDEMENT WITH ACROMIOPLASTY,  ROTATOR CUFF REPAIR AND RELEASE BICEPS TENDON (Left) DISTAL CLAVICLE EXCISION (Left)  Patient Location: PACU  Anesthesia Type: General, Regional   Level of Consciousness: awake, alert  and oriented  Airway and Oxygen Therapy: Patient Spontanous Breathing  Post-op Pain: none  Post-op Assessment: Post-op Vital signs reviewed  Post-op Vital Signs: Reviewed  Last Vitals:  Filed Vitals:   10/12/14 1216  BP: 115/52  Pulse: 76  Temp: 36.5 C  Resp: 18    Complications: No apparent anesthesia complications

## 2014-10-12 NOTE — Discharge Instructions (Signed)
Shouder arthroscopy, rotator cuff repair, subacromial decompression Care After Instructions Refer to this sheet in the next few weeks. These discharge instructions provide you with general information on caring for yourself after you leave the hospital. Your caregiver may also give you specific instructions. Your treatment has been planned according to the most current medical practices available, but unavoidable complications sometimes occur. If you have any problems or questions after discharge, please call your caregiver. HOME INSTRUCTIONS You may resume a normal diet and activities as directed.  Take showers instead of baths until informed otherwise.  Change bandages (dressings) in 3 days.  Swab wounds daily with betadine.  Wash shoulder with soap and water.  Pat dry.  Cover wounds with bandaids. Only take over-the-counter or prescription medicines for pain, discomfort, or fever as directed by your caregiver.  Wear your sling for the next 6 weeks unless otherwise instructed. Eat a well-balanced diet.  Avoid lifting or driving until you are instructed otherwise.  Make an appointment to see your caregiver for stitches (suture) or staple removal one week after surgery.   SEEK MEDICAL CARE IF: You have swelling of your calf or leg.  You develop shortness of breath or chest pain.  You have redness, swelling, or increasing pain in the wound.  There is pus or any unusual drainage coming from the surgical site.  You notice a bad smell coming from the surgical site or dressing.  The surgical site breaks open after sutures or staples have been removed.  There is persistent bleeding from the suture or staple line.  You are getting worse or are not improving.  You have any other questions or concerns.  SEEK IMMEDIATE MEDICAL CARE IF:  You have a fever greater than 101 You develop a rash.  You have difficulty breathing.  You develop any reaction or side effects to medicines given.  Your knee  motion is decreasing rather than improving.  MAKE SURE YOU:  Understand these instructions.  Will watch your condition.  Will get help right away if you are not doing well or get worse.    Post Anesthesia Home Care Instructions  Activity: Get plenty of rest for the remainder of the day. A responsible adult should stay with you for 24 hours following the procedure.  For the next 24 hours, DO NOT: -Drive a car -Paediatric nurse -Drink alcoholic beverages -Take any medication unless instructed by your physician -Make any legal decisions or sign important papers.  Meals: Start with liquid foods such as gelatin or soup. Progress to regular foods as tolerated. Avoid greasy, spicy, heavy foods. If nausea and/or vomiting occur, drink only clear liquids until the nausea and/or vomiting subsides. Call your physician if vomiting continues.  Special Instructions/Symptoms: Your throat may feel dry or sore from the anesthesia or the breathing tube placed in your throat during surgery. If this causes discomfort, gargle with warm salt water. The discomfort should disappear within 24 hours.  If you had a scopolamine patch placed behind your ear for the management of post- operative nausea and/or vomiting:  1. The medication in the patch is effective for 72 hours, after which it should be removed.  Wrap patch in a tissue and discard in the trash. Wash hands thoroughly with soap and water. 2. You may remove the patch earlier than 72 hours if you experience unpleasant side effects which may include dry mouth, dizziness or visual disturbances. 3. Avoid touching the patch. Wash your hands with soap and water after contact with  the patch.   Regional Anesthesia Blocks  1. Numbness or the inability to move the "blocked" extremity may last from 3-48 hours after placement. The length of time depends on the medication injected and your individual response to the medication. If the numbness is not going away  after 48 hours, call your surgeon.  2. The extremity that is blocked will need to be protected until the numbness is gone and the  Strength has returned. Because you cannot feel it, you will need to take extra care to avoid injury. Because it may be weak, you may have difficulty moving it or using it. You may not know what position it is in without looking at it while the block is in effect.  3. For blocks in the legs and feet, returning to weight bearing and walking needs to be done carefully. You will need to wait until the numbness is entirely gone and the strength has returned. You should be able to move your leg and foot normally before you try and bear weight or walk. You will need someone to be with you when you first try to ensure you do not fall and possibly risk injury.  4. Bruising and tenderness at the needle site are common side effects and will resolve in a few days.  5. Persistent numbness or new problems with movement should be communicated to the surgeon or the [facility] [phone] [place] [phone]).

## 2014-10-12 NOTE — Progress Notes (Signed)
Assisted Dr. Ermalene Postin with left, ultrasound guided, interscalene  block. Side rails up, monitors on throughout procedure. See vital signs in flow sheet. Tolerated Procedure well.

## 2014-10-12 NOTE — Anesthesia Preprocedure Evaluation (Signed)
Anesthesia Evaluation  Patient identified by MRN, date of birth, ID band Patient awake    Reviewed: Allergy & Precautions, NPO status , Patient's Chart, lab work & pertinent test results  History of Anesthesia Complications Negative for: history of anesthetic complications  Airway Mallampati: II  TM Distance: >3 FB Neck ROM: Full    Dental  (+) Teeth Intact   Pulmonary shortness of breath and Long-Term Oxygen Therapy, neg sleep apnea, COPD COPD inhaler, neg recent URI, former smoker, neg PE breath sounds clear to auscultation        Cardiovascular hypertension, Pt. on medications - angina- Past MI and - CHF negative cardio ROS  Rhythm:Regular     Neuro/Psych negative neurological ROS  negative psych ROS   GI/Hepatic negative GI ROS, Neg liver ROS,   Endo/Other  negative endocrine ROS  Renal/GU negative Renal ROS     Musculoskeletal negative musculoskeletal ROS (+)   Abdominal   Peds  Hematology negative hematology ROS (+)   Anesthesia Other Findings   Reproductive/Obstetrics                             Anesthesia Physical Anesthesia Plan  ASA: III  Anesthesia Plan: General and Regional   Post-op Pain Management:    Induction: Intravenous  Airway Management Planned:   Additional Equipment: None  Intra-op Plan:   Post-operative Plan: Extubation in OR  Informed Consent: I have reviewed the patients History and Physical, chart, labs and discussed the procedure including the risks, benefits and alternatives for the proposed anesthesia with the patient or authorized representative who has indicated his/her understanding and acceptance.   Dental advisory given  Plan Discussed with: CRNA and Surgeon  Anesthesia Plan Comments:         Anesthesia Quick Evaluation

## 2014-10-12 NOTE — Transfer of Care (Signed)
Immediate Anesthesia Transfer of Care Note  Patient: [name]  Procedure(s) Performed: Procedure(s): LEFT SHOULDER ARTHROSCOPY, DEBRIDEMENT WITH ACROMIOPLASTY,  ROTATOR CUFF REPAIR AND BICEPS TENOLYSIS (Left) DISTAL CLAVICLE EXCISION (Left)  Patient Location: PACU  Anesthesia Type:GA combined with regional for post-op pain  Level of Consciousness: awake, alert , oriented and patient cooperative  Airway & Oxygen Therapy: Patient Spontanous Breathing and Patient connected to face mask oxygen  Post-op Assessment: Report given to RN and Post -op Vital signs reviewed and stable  Post vital signs: Reviewed and stable  Last Vitals:  Filed Vitals:   10/12/14 0937  BP:   Pulse:   Temp:   Resp: 20    Complications: No apparent anesthesia complications

## 2014-10-13 ENCOUNTER — Encounter (HOSPITAL_BASED_OUTPATIENT_CLINIC_OR_DEPARTMENT_OTHER): Payer: Self-pay | Admitting: Orthopedic Surgery

## 2014-10-13 NOTE — Op Note (Signed)
[name], NETH NO.:  [id number]  MEDICAL RECORD NO.:  [id number]  LOCATION:                                FACILITY:  [facility]  PHYSICIAN:  Ninetta Lights, M.D. DATE OF BIRTH:  05-Jan-1946  DATE OF PROCEDURE:  10/12/2014 DATE OF DISCHARGE:  10/12/2014                              OPERATIVE REPORT   PREOPERATIVE DIAGNOSES:  Left shoulder complete rotator cuff tear retracted.  Impingement.  Partial tearing long head biceps tendon. Degenerative joint disease acromioclavicular joint.  POSTOPERATIVE DIAGNOSES:  Left shoulder complete rotator cuff tear retracted.  Impingement.  Partial tearing long head biceps tendon. Degenerative joint disease acromioclavicular joint with a markedly retracted supraspinatus, infraspinatus tear which was traumatic and reparable.  Marked tearing long head biceps.  Labral tears above and below.  Marked osteopenia proximal humerus.  PROCEDURE:  Left shoulder exam under anesthesia, arthroscopy.  Released resection intra-articular portion of biceps.  Debridement of labrum. Debridement and immobilization of rotator cuff.  Arthroscopic-assisted rotator cuff repair with FiberWire sutures x3, swivel lock anchors x3. Bursectomy, acromioplasty, coracoacromial ligament release.  Excision of distal clavicle.  SURGEON-Ethyle Tiedt:  Ninetta Lights, M.D.  ASSISTANT:  Elmyra Ricks, P.A., present throughout the entire case and necessary for timely completion of procedure.  ANESTHESIA:  General.  BLOOD LOSS:  Minimal.  SPECIMENS:  None.  CULTURES:  None.  COMPLICATIONS:  None.  DRESSINGS:  Soft compressive shoulder immobilizer.  DESCRIPTION OF PROCEDURE:  The patient was brought to the operating room, placed on the operating table in supine position.  After adequate anesthesia had been obtained, shoulder examined.  A little bit of adhesions, gentle manipulation full motion, stable shoulder at completion.  Placed in a beach-chair  position, shoulder positioner, prepped and draped in usual sterile fashion.  Three portals anterior, posterior, and lateral.  Arthroscope introduced, shoulder was distended and inspected.  Tearing more than 50% of the biceps.  Released, resected, allowed to recess in the bicipital groove.  Complete tear supraspinatus and infraspinatus.  This looked good, was still mobile could be brought out laterally.  Fair amount of attrition throughout the cuff however.  The cuff was mobilized.  Some tearing of the labrum above and below debrided.  Remaining articular cartilage still looked good. Bursa resected.  Acromioplasty from type 3 to type 1 acromion releasing CA ligament.  Grade 4 changes AC joint with spurring.  Periarticular spurs lateral centimeter of clavicle resected.  With the lateral cannula, the cuff was captured with 3 horizontal mattress suture. Firmly anchored down the tuberosity with 3 swivel lock anchors.  The two in front were able to be placed into the tuberosity and despite the degree of osteopenia I got a reasonable anchor there.  Then one in the back, however, I had to go all the way up to the lateral shaft of the humerus before I get any anchor to hold.  At completion, I did have a nice solid watertight closure of the cuff.  Instruments were fully removed.  Portals were closed with nylon.  Sterile compressive dressing applied.  Shoulder mobilizer applied.  Anesthesia reversed.  Brought to the recovery room.  Tolerated the surgery well, no complications.  Ninetta Lights, M.D.     DFM/MEDQ  D:  10/12/2014  T:  10/13/2014  Job:  [id number]

## 2014-11-07 ENCOUNTER — Other Ambulatory Visit: Payer: Self-pay | Admitting: Internal Medicine

## 2014-11-09 ENCOUNTER — Encounter: Payer: Self-pay | Admitting: Internal Medicine

## 2014-11-09 ENCOUNTER — Ambulatory Visit (INDEPENDENT_AMBULATORY_CARE_PROVIDER_SITE_OTHER): Payer: 59 | Admitting: Internal Medicine

## 2014-11-09 VITALS — BP 124/70 | HR 103 | Ht 62.0 in | Wt 104.2 lb

## 2014-11-09 DIAGNOSIS — J9621 Acute and chronic respiratory failure with hypoxia: Secondary | ICD-10-CM

## 2014-11-09 DIAGNOSIS — J449 Chronic obstructive pulmonary disease, unspecified: Secondary | ICD-10-CM | POA: Diagnosis not present

## 2014-11-09 DIAGNOSIS — J9611 Chronic respiratory failure with hypoxia: Secondary | ICD-10-CM | POA: Diagnosis not present

## 2014-11-09 NOTE — Patient Instructions (Addendum)
Please see patient coordinator before you leave today  to schedule overnight oximetry on room air  to see if can stop your nocturnal 02   Please schedule a follow up visit in 6 months but call sooner if needed

## 2014-11-09 NOTE — Progress Notes (Signed)
Subjective:    Patient ID: [name], female    DOB: [birth year]   MRN: [id number]  HPI #Smoker Smoker - STarted age 70. 25 pack per day. Quit end 2012 for first time with help of wellbutrin and patch.  But recently 7 year old granddaughter from congential liver disease ("cirrhosis") moved back with her and since then smoking. Patient is primary health care provider because her daughter and 61 year old do not get along.  Smokes 1 ppd (used to smoke 2 ppd)  #Chronic cough  - Stop ACE inhibitor fall 2013  #Lung nodule - 61m RLL March 2014  -  CT scan chest 08/19/2012: 865mnoncalcified right lower lobe nodule with irregular margins.  - PET scan 09/20/12 - indeterminate uptake   #COPD Gold stage III PFT 12/10/2011  - Gold stage 3 copd - fev1 0.8L/42%, 20% BD respinse. TLC 133%, DLCO 46% - Walk test in 2013   - pulse ox 88% at first lap 185 feet but increased to 91% at 3rd lap end  #AECOPD  - OV 05/12/2012 - treated with Levaquin and prednisone - March 2014: Failed aecopd office  treatment on 08/17/2012 and hospitalized between 08/19/2012 in 08/21/2012; discharged on 10 day prednisone taper    OV 08/27/2012  COPD: Failed aecopd office  treatment on 08/17/2012 and hospitalized between 08/19/2012 in 08/21/2012; discharged on 10 day prednisone taper. Currently feeling like she is slowly limping back to normal. FEels she needs another week off; STil lwith fatigue  New RLL nodule 60m260mdiagnosed while in hospital for aecopd.  She does not believe this is potentially lung cancer. She believes she was told `nothing to worry about` but she understands need for followup. She is not interested in intervetnion curently    REC  COPD COPD - Glad you slowly getting better after recent flareup - Continue the medications and advice of the hospitalist who treated you - Continue your regular medications for COPD - Take another week off and slowly work on gaining her strength back - When  financially possible I would recommend you take oxygen at night - When time allows I would recommend you to pulmonary rehabilitation - Start N. acetylcysteine 600 mg twice a day    #Lung nodule - Do PET scan into 10 weeks  #Smoking - Really important to quit smoking for all the various reasons as discussed  #Followup - 10 weeks with pet scan results   OV 01/06/2013 Followup for the above.  -In terms of lung nodule: We will make sure there is a followup scan of the chest in mid August 2014  - In terms of COPD: Details of symptoms are listed in the table below. COPD cat score is 23 and is around baseline but she still has significant symptoms. I noticed that she is on multiple different inhalers and has poor understanding of these. She is actually taking Combivent, Advair and Spiriva. I tried explained the differences between Spiriva and Combivent and why she should not overlap these inhalers but she got upset that is to restrict her inhaler use her logic is that when she takes Combivent it helps immediately and that Spiriva is only once a day and she does not seem to get a huge benefit from it. When I suggested that she switch Advair twice a day  to Brio once a day she got upset thinking that in switching twice a day inhaler which is superior because it is twice a day to once a day  inhaler which is potentially inferior because it once a day  Terms of smoking: She continues to smoke but acknowledges that it is important for to quit but really has no plan or does not want help rec #COPD  - stop combivent as needed. Instead take pro-air as needed  - stop advair. Instead take BREO 1 puff at night  - continue spiriva  - Please take prednisone 40 mg x1 day, then 30 mg x1 day, then 20 mg x1 day, then 10 mg x1 day, and then 5 mg x1 day and stop    #Lung nodule - Do CT scan chest mid august 2014; Weill ensure there is order  #Smoking - Really important to quit smoking for all the various  reasons as discussed  #Followup - 4 weeks ith CAT score and CT results  - see NP Tammy for followup     01/26/2013 f/u ov/Haileigh Pitz re abn ct chest/still smoking  Chief Complaint  Patient presents with  . Acute Visit    Pt states having slight increased SOB and hemoptysis x 5 days. She states recently dxed with PNA and has not started on tx for this yet.   at baseline says sputum yellow to green each am x years then abruptly Started feeling 8/4 weak, more sob, felt hots/ sweats/ abruptly worse 8/5 with pain R post chest and then bloody sputum on 8/9.  Variable dysphagia, sticks in throat. No obvious asp events Had dental work 2 weeks before onset  Imp ? Asp pna sup segment RLL  rec augmentin 875 twice daily x 10 days The key is to stop smoking completely before smoking completely stops you!   02/11/2013 f/u ov/Ryett Hamman  Chief Complaint  Patient presents with  . Follow-up    Pt reports breathing back at her baseline. Her cough has improved with no more hemoptysis and occ white sputum.   Maint on spiriva/ advair and rare need for combivent, doe with more than slow adls rec The key is to stop smoking completely before smoking completely stops you!  Only use your albuterol (proaire) as a rescue medication to be used if you can't catch your breath by resting or doing a relaxed purse lip breathing pattern. The less you use it, the better it will work when you need it.  See Ramaswamy in 3 months with a follow cxr > did not return as rec    06/17/2013 f/u ov/Jaedon Siler re: aecopd Chief Complaint  Patient presents with  . Follow-up    Increased SOB, cough and wheezing x1 week   zpak called in on 06/13/13 and mucus turned white but still very congested cough. Using advair/ spiriva as maint and now increase need for duoneb up to twice daily  rec Prednisone 10 mg take  4 each am x 2 days, 3 x 2 days,   2 each am x 2 days,  1 each am x 2 days and stop  Only use your albuterol (proaire) as a rescue  medication  For cough >  mucinex or mucinex dm 1200 mg every 12 hours   09/26/2013 f/u ov/Mariajose Mow re: still smoking plus advair/ spiriva very confused with timing of maint and prns Chief Complaint  Patient presents with  . Follow-up    Breathing unchanged since the last visit. She also c/o chest tightness for the past 3 days. Cough is prod with large amounts of clear sputum. She uses rescue inhaler approx 2 times per wk on average and also using duoneb a  few times per day.   doe x > room to room walking/ not using mucinex as rec, no purulent sputum Chest tightness better p saba  >>pred taper   10/11/2013 Follow up  Returns for follow up for recent COPD flare.  Was given steroid taper last ov. Feeling much better.  Reports breathing is improved since last ov, Doing well with the Symbicort.  no new complaints. Discussed smoking cessatioin  No fever, chest pain, orthopnea, or edema  CXR last ov showed improved right suprahilar infiltrate.  rec Continue on Symbicort and Spiriva , rinse after use.  MOST IMPORTANT GOAL IS TO QUIT SMOKING.  follow up Dr. Melvyn Novas  In 3 months and As needed      01/16/2014 f/u ov/Onyx Schirmer re: GOLD III COPD / insurance requiring trial of incruse over spiriva Chief Complaint  Patient presents with  . Follow-up    Pt states that overall her breathing is doing well. Some increased chest tightness last wk which she relates to humid weather- had to use rescue inhaler a few times last wk.   rec Add incruse 2 puffs off one click each am only,  And only after the am  symbicort Work on inhaler technique:    Prevnar given today.    04/05/2014 f/u ov/Antwuan Eckley re: copd GOLD III still smoking  Chief Complaint  Patient presents with  . Follow-up    Breathing is about the same, has good days and bad days.  Using albuterol inhaler about once per day.      continues to struggle with understanding insurance restrictions / alternatives >>wellbutrin rx , tiral of BREO instead of  symbicort   06/30/2014 Acute OV  Complains of cough, congestion, wheezing and tightness for 1 week. Was called in [place] and pred taper on 1/11. Says she is some better but still wheezing. Did notice O2 sats dropped 80s when walking initially.  Today in ofifce O2 sats at rest >90% on RA, walking dropped to , on 2l/m kept sats >90%  She is on O2 at home  At bedtime   No chest pain, orthopnea, edema or fever. Remains on Symbicort, did not like BREO .  Still smoking . rec Prednisone taper over next week  Finish ZPack .  Chest xray today .  Begin O2 at 2l/m with activity and continue on O2 At bedtime  .  Mucinex DM Twice daily  As needed  Cough/congestion    08/11/2014 f/u ov/Tamaj Jurgens re: GOLD III still smoking / spiriva/ symbicort  Chief Complaint  Patient presents with  . Follow-up    Pt states that her breathing is doing well.  Her cough has improved.   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 Follow-up    Breathing is unchanged. She has not needed rescue inhaler or neb.   doe x more than nl pace or up inclines  = MMRC 1 and not using 02   No obvious day to day or daytime variabilty or assoc chronic cough or cp or chest tightness, subjective wheeze overt sinus or hb symptoms. No unusual exp hx or h/o childhood pna/  asthma or knowledge of premature birth.  Sleeping ok without nocturnal  or early am exacerbation  of respiratory  c/o's or need for noct saba. Also denies any obvious fluctuation of symptoms with weather or environmental changes or other aggravating or alleviating factors except as outlined above   Current Medications, Allergies, Complete Past Medical History, Past Surgical  History, Family History, and Social History were reviewed in [employer] record.  ROS  The following are not active complaints unless bolded sore throat, dysphagia, dental problems, itching, sneezing,  nasal congestion or excess/ purulent secretions, ear ache,   fever, chills, sweats, unintended wt loss, pleuritic or exertional cp, hemoptysis,  orthopnea pnd or leg swelling, presyncope, palpitations, heartburn, abdominal pain, anorexia, nausea, vomiting, diarrhea  or change in bowel or urinary habits, change in stools or urine, dysuria,hematuria,  rash, arthralgias, visual complaints, headache, numbness weakness or ataxia or problems with walking or coordination,  change in mood/affect or memory.                          Objective:   Physical Exam   Chronically ill amb thin wf L shoulder in sling   09/26/2013        101>102 10/11/2013 > 01/16/2014  99 > 04/06/2014  103 >105 06/30/2014 > 08/11/2014  101 > 11/09/2014  104    HEENT mild turbinate edema.  Oropharynx no thrush or excess pnd or cobblestoning.  No JVD or cervical adenopathy. Mild accessory muscle hypertrophy. Trachea midline, nl thryroid. Chest was hyperinflated by percussion with diminished breath sounds, no wheezing . Marland Kitchen Regular rate and rhythm without murmur gallop or rub or increase P2 or edema.  Abd: no hsm, nl excursion. Ext warm without cyanosis or clubbing.  MS  Marked crepitatance L shoulder unable to abduct to horizontal position even passively.    I personally reviewed images and agree with radiology impression as follows:  CXR: 06/30/14 COPD/chronic changes. No active disease           Assessment & Plan:

## 2014-11-13 ENCOUNTER — Encounter: Payer: Self-pay | Admitting: Internal Medicine

## 2014-11-13 NOTE — Assessment & Plan Note (Addendum)
pfts 11/2011  0.86 (42%) ratio 35  -- 01/16/2014  p extensive coaching HFA effectiveness =    75% but 100% with dpi    I had an extended discussion with the patient reviewing all relevant studies completed to date and  lasting 15 to 20 minutes of a 25 minute visit on the following ongoing concerns:  1) she has severe but well compensated copd on present rx with symb/ spiriva with rare need for rescue  2) I  reviewed the Fletcher curve with the patient that basically indicates   it is highly unlikely you will progress to endstage disease and informed the patient there was no medication on the market that has proven to alter the curve/ its downward trajectory  or the likelihood of progression of their disease.  Therefore stopping   maintaining abstinence from smoking is the most important aspect of her care at this point, not choice of inhalers or for that matter, doctors.    3) Each maintenance medication was reviewed in detail including most importantly the difference between maintenance and as needed and under what circumstances the prns are to be used.  Please see instructions for details which were reviewed in writing and the patient given a copy.

## 2014-11-13 NOTE — Assessment & Plan Note (Signed)
Exertional desats 06/30/2014 >O2 2lm rx with act  - 11/09/2014  Walked RA  2 laps @ 185 ft each stopped due to  Sat down to 88 % at nl pace but no sob  - ono RA ordered  11/13/2014 >>>   rx as of 11/09/14 use 02 with exercise but not adls

## 2014-11-14 ENCOUNTER — Other Ambulatory Visit: Payer: Self-pay | Admitting: Internal Medicine

## 2014-11-30 ENCOUNTER — Ambulatory Visit (HOSPITAL_COMMUNITY): Payer: 59 | Attending: Internal Medicine

## 2014-11-30 ENCOUNTER — Other Ambulatory Visit: Payer: Self-pay | Admitting: Sports Medicine

## 2014-11-30 DIAGNOSIS — M7989 Other specified soft tissue disorders: Secondary | ICD-10-CM | POA: Diagnosis not present

## 2014-11-30 DIAGNOSIS — M79606 Pain in leg, unspecified: Secondary | ICD-10-CM | POA: Diagnosis not present

## 2014-12-05 ENCOUNTER — Other Ambulatory Visit: Payer: Self-pay | Admitting: Internal Medicine

## 2015-01-22 ENCOUNTER — Ambulatory Visit (INDEPENDENT_AMBULATORY_CARE_PROVIDER_SITE_OTHER)
Admission: RE | Admit: 2015-01-22 | Discharge: 2015-01-22 | Disposition: A | Discharge status: Home / Self Care | Payer: 59 | Source: Ambulatory Visit | Attending: Internal Medicine | Admitting: Internal Medicine

## 2015-01-22 ENCOUNTER — Ambulatory Visit (INDEPENDENT_AMBULATORY_CARE_PROVIDER_SITE_OTHER): Payer: 59 | Admitting: Internal Medicine

## 2015-01-22 ENCOUNTER — Telehealth: Payer: Self-pay | Admitting: Internal Medicine

## 2015-01-22 ENCOUNTER — Encounter: Payer: Self-pay | Admitting: Internal Medicine

## 2015-01-22 VITALS — BP 102/60 | HR 108 | Temp 98.7°F | Ht 62.0 in | Wt 106.0 lb

## 2015-01-22 DIAGNOSIS — Z720 Tobacco use: Secondary | ICD-10-CM | POA: Diagnosis not present

## 2015-01-22 DIAGNOSIS — J449 Chronic obstructive pulmonary disease, unspecified: Secondary | ICD-10-CM

## 2015-01-22 DIAGNOSIS — J189 Pneumonia, unspecified organism: Secondary | ICD-10-CM

## 2015-01-22 DIAGNOSIS — I10 Essential (primary) hypertension: Secondary | ICD-10-CM

## 2015-01-22 DIAGNOSIS — F17210 Nicotine dependence, cigarettes, uncomplicated: Secondary | ICD-10-CM

## 2015-01-22 MED ORDER — ONDANSETRON HCL 4 MG PO TABS: 4.0000 mg | ORAL_TABLET | Freq: Three times a day (TID) | ORAL | Status: DC | PRN

## 2015-01-22 MED ORDER — LEVOFLOXACIN 500 MG PO TABS: 500.0000 mg | ORAL_TABLET | Freq: Every day | ORAL | Status: DC

## 2015-01-22 MED ORDER — HYDROCODONE-ACETAMINOPHEN 5-325 MG PO TABS: 1.0000 | ORAL_TABLET | Freq: Four times a day (QID) | ORAL | Status: DC | PRN

## 2015-01-22 NOTE — Telephone Encounter (Signed)
Spoke with pt.  She is scheduled to see MW today at 1:30 and is aware to arrive early for cxr first (order placed).  Pt will bring all meds with her.  She is to seek emergency care if needed prior to appt.  Pt verbalized understanding of instructions, is in agreement with plan, and voiced no further questions or concerns at this time.

## 2015-01-22 NOTE — Progress Notes (Signed)
Subjective:    Patient ID: [name], female    DOB: [birth year]   MRN: [id number]  HPI #Smoker Smoker - STarted age 70. 25 pack per day. Quit end 2012 for first time with help of wellbutrin and patch.  But recently 7 year old granddaughter from congential liver disease ("cirrhosis") moved back with her and since then smoking. Patient is primary health care provider because her daughter and 61 year old do not get along.  Smokes 1 ppd (used to smoke 2 ppd)  #Chronic cough  - Stop ACE inhibitor fall 2013  #Lung nodule - 61m RLL March 2014  -  CT scan chest 08/19/2012: 865mnoncalcified right lower lobe nodule with irregular margins.  - PET scan 09/20/12 - indeterminate uptake   #COPD Gold stage III PFT 12/10/2011  - Gold stage 3 copd - fev1 0.8L/42%, 20% BD respinse. TLC 133%, DLCO 46% - Walk test in 2013   - pulse ox 88% at first lap 185 feet but increased to 91% at 3rd lap end  #AECOPD  - OV 05/12/2012 - treated with Levaquin and prednisone - March 2014: Failed aecopd office  treatment on 08/17/2012 and hospitalized between 08/19/2012 in 08/21/2012; discharged on 10 day prednisone taper    OV 08/27/2012  COPD: Failed aecopd office  treatment on 08/17/2012 and hospitalized between 08/19/2012 in 08/21/2012; discharged on 10 day prednisone taper. Currently feeling like she is slowly limping back to normal. FEels she needs another week off; STil lwith fatigue  New RLL nodule 60m260mdiagnosed while in hospital for aecopd.  She does not believe this is potentially lung cancer. She believes she was told `nothing to worry about` but she understands need for followup. She is not interested in intervetnion curently    REC  COPD COPD - Glad you slowly getting better after recent flareup - Continue the medications and advice of the hospitalist who treated you - Continue your regular medications for COPD - Take another week off and slowly work on gaining her strength back - When  financially possible I would recommend you take oxygen at night - When time allows I would recommend you to pulmonary rehabilitation - Start N. acetylcysteine 600 mg twice a day    #Lung nodule - Do PET scan into 10 weeks  #Smoking - Really important to quit smoking for all the various reasons as discussed  #Followup - 10 weeks with pet scan results   OV 01/06/2013 Followup for the above.  -In terms of lung nodule: We will make sure there is a followup scan of the chest in mid August 2014  - In terms of COPD: Details of symptoms are listed in the table below. COPD cat score is 23 and is around baseline but she still has significant symptoms. I noticed that she is on multiple different inhalers and has poor understanding of these. She is actually taking Combivent, Advair and Spiriva. I tried explained the differences between Spiriva and Combivent and why she should not overlap these inhalers but she got upset that is to restrict her inhaler use her logic is that when she takes Combivent it helps immediately and that Spiriva is only once a day and she does not seem to get a huge benefit from it. When I suggested that she switch Advair twice a day  to Brio once a day she got upset thinking that in switching twice a day inhaler which is superior because it is twice a day to once a day  inhaler which is potentially inferior because it once a day  Terms of smoking: She continues to smoke but acknowledges that it is important for to quit but really has no plan or does not want help rec #COPD  - stop combivent as needed. Instead take pro-air as needed  - stop advair. Instead take BREO 1 puff at night  - continue spiriva  - Please take prednisone 40 mg x1 day, then 30 mg x1 day, then 20 mg x1 day, then 10 mg x1 day, and then 5 mg x1 day and stop    #Lung nodule - Do CT scan chest mid august 2014; Weill ensure there is order  #Smoking - Really important to quit smoking for all the various  reasons as discussed  #Followup - 4 weeks ith CAT score and CT results  - see NP Tammy for followup     01/26/2013 f/u ov/[name] re abn ct chest/still smoking  Chief Complaint  Patient presents with  . Acute Visit    Pt states having slight increased SOB and hemoptysis x 5 days. She states recently dxed with PNA and has not started on tx for this yet.   at baseline says sputum yellow to green each am x years then abruptly Started feeling 8/4 weak, more sob, felt hots/ sweats/ abruptly worse 8/5 with pain R post chest and then bloody sputum on 8/9.  Variable dysphagia, sticks in throat. No obvious asp events Had dental work 2 weeks before onset  Imp ? Asp pna sup segment RLL  rec augmentin 875 twice daily x 10 days The key is to stop smoking completely before smoking completely stops you!   02/11/2013 f/u ov/[name]  Chief Complaint  Patient presents with  . Follow-up    Pt reports breathing back at her baseline. Her cough has improved with no more hemoptysis and occ white sputum.   Maint on spiriva/ advair and rare need for combivent, doe with more than slow adls rec The key is to stop smoking completely before smoking completely stops you!  Only use your albuterol (proaire) as a rescue medication to be used if you can't catch your breath by resting or doing a relaxed purse lip breathing pattern. The less you use it, the better it will work when you need it.  See Ramaswamy in 3 months with a follow cxr > did not return as rec    06/17/2013 f/u ov/[name] re: aecopd Chief Complaint  Patient presents with  . Follow-up    Increased SOB, cough and wheezing x1 week   zpak called in on 06/13/13 and mucus turned white but still very congested cough. Using advair/ spiriva as maint and now increase need for duoneb up to twice daily  rec Prednisone 10 mg take  4 each am x 2 days, 3 x 2 days,   2 each am x 2 days,  1 each am x 2 days and stop  Only use your albuterol (proaire) as a rescue  medication  For cough >  mucinex or mucinex dm 1200 mg every 12 hours   09/26/2013 f/u ov/[name] re: still smoking plus advair/ spiriva very confused with timing of maint and prns Chief Complaint  Patient presents with  . Follow-up    Breathing unchanged since the last visit. She also c/o chest tightness for the past 3 days. Cough is prod with large amounts of clear sputum. She uses rescue inhaler approx 2 times per wk on average and also using duoneb a  few times per day.   doe x > room to room walking/ not using mucinex as rec, no purulent sputum Chest tightness better p saba  >>pred taper   10/11/2013 Follow up  Returns for follow up for recent COPD flare.  Was given steroid taper last ov. Feeling much better.  Reports breathing is improved since last ov, Doing well with the Symbicort.  no new complaints. Discussed smoking cessatioin  No fever, chest pain, orthopnea, or edema  CXR last ov showed improved right suprahilar infiltrate.  rec Continue on Symbicort and Spiriva , rinse after use.  MOST IMPORTANT GOAL IS TO QUIT SMOKING.  follow up Dr. Melvyn Novas  In 3 months and As needed      01/16/2014 f/u ov/Jazelyn Sipe re: GOLD III COPD / insurance requiring trial of incruse over spiriva Chief Complaint  Patient presents with  . Follow-up    Pt states that overall her breathing is doing well. Some increased chest tightness last wk which she relates to humid weather- had to use rescue inhaler a few times last wk.   rec Add incruse 2 puffs off one click each am only,  And only after the am  symbicort Work on inhaler technique:    Prevnar given today.    04/05/2014 f/u ov/Philippa Vessey re: copd GOLD III still smoking  Chief Complaint  Patient presents with  . Follow-up    Breathing is about the same, has good days and bad days.  Using albuterol inhaler about once per day.      continues to struggle with understanding insurance restrictions / alternatives >>wellbutrin rx , tiral of BREO instead of  symbicort   06/30/2014 Acute OV  Complains of cough, congestion, wheezing and tightness for 1 week. Was called in [place] and pred taper on 1/11. Says she is some better but still wheezing. Did notice O2 sats dropped 80s when walking initially.  Today in ofifce O2 sats at rest >90% on RA, walking dropped to , on 2l/m kept sats >90%  She is on O2 at home  At bedtime   No chest pain, orthopnea, edema or fever. Remains on Symbicort, did not like BREO .  Still smoking . rec Prednisone taper over next week  Finish ZPack .  Chest xray today .  Begin O2 at 2l/m with activity and continue on O2 At bedtime  .  Mucinex DM Twice daily  As needed  Cough/congestion    08/11/2014 f/u ov/Dallan Schonberg re: GOLD III still smoking / spiriva/ symbicort  Chief Complaint  Patient presents with  . Follow-up    Pt states that her breathing is doing well.  Her cough has improved.   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 Follow-up    Breathing is unchanged. She has not needed rescue inhaler or neb.   doe x more than nl pace or up inclines  = MMRC 1 and not using 02  rec  schedule overnight oximetry on room air  to see if can stop your nocturnal 02 > never happened but remained on 2lpm     01/22/2015 acute ov/Chibuikem Thang re: CAP  in pt with copd GOLD III  / still smoking 02 2lpm  at hs Chief Complaint  Patient presents with  . Acute Visit    Pt c/o increased SOB and right CP x 3 days. She states she notices wheezing with moderate exertion.     Acute onset  Gradually worseng R pleuritic cp since am 01/20/15 p resuming smoking 12/29/14 / no real increase in saba hfa/ no neb assoc with congested cough with white mucus and  no rigors / no sweats or documented fever  No obvious day to day or daytime variabilty or assoc chest tightness,  overt sinus or hb symptoms. No unusual exp hx or h/o childhood pna/ asthma or knowledge of premature birth.  Sleeping ok without nocturnal  or early am exacerbation  of respiratory  c/o's or need for noct saba. Also denies any obvious fluctuation of symptoms with weather or environmental changes or other aggravating or alleviating factors except as outlined above   Current Medications, Allergies, Complete Past Medical History, Past Surgical History, Family History, and Social History were reviewed in [employer] record.  ROS  The following are not active complaints unless bolded sore throat, dysphagia, dental problems, itching, sneezing,  nasal congestion or excess/ purulent secretions, ear ache,   fever, chills, sweats, unintended wt loss,   exertional cp, hemoptysis,  orthopnea pnd or leg swelling, presyncope, palpitations, heartburn, abdominal pain, anorexia, nausea, vomiting, diarrhea  or change in bowel or urinary habits, change in stools or urine, dysuria,hematuria,  rash, arthralgias, visual complaints, headache, numbness weakness or ataxia or problems with walking or coordination,  change in mood/affect or memory.             Objective:   Physical Exam   Chronically ill amb nad    09/26/2013        101>102 10/11/2013 > 01/16/2014  99 > 04/06/2014  103 >105 06/30/2014 > 08/11/2014  101 > 11/09/2014  104 > 01/22/2015 106    HEENT mild turbinate edema.  Oropharynx no thrush or excess pnd or cobblestoning.  No JVD or cervical adenopathy. Mild accessory muscle hypertrophy. Trachea midline, nl thryroid. Chest was hyperinflated by percussion with diminished breath sounds, no wheezing . Marland Kitchen Regular rate and rhythm without murmur gallop or rub or increase P2 or edema.  Abd: no hsm, nl excursion. Ext warm without cyanosis or clubbing.  MS  Nl rom/ no deformities     I  personally reviewed images and agree with radiology impression as follows:  CXR: 01/22/2015   New patchy area of infiltrate in the right upper lobe. Follow-up is recommended to ensure clearing. Severe COPD.           Assessment & Plan:

## 2015-01-22 NOTE — Patient Instructions (Signed)
For pain or cough use norco 5 -325 one every 4 hours as needed   Work on inhaler technique:  relax and gently blow all the way out then take a nice smooth deep breath back in, triggering the inhaler at same time you start breathing in.  Hold for up to 5 seconds if you can.  Rinse and gargle with water when done  Only use your albuterol as a rescue medication to be used if you can't catch your breath by resting or doing a relaxed purse lip breathing pattern.  - The less you use it, the better it will work when you need it. - Ok to use up to 2 puffs  every 4 hours if you must but call for immediate appointment if use goes up over your usual need - Don't leave home without it !!  (think of it like the spare tire for your car)   Only use the nebulizer if you try the inhalers first and it doesn't work  The key is to stop smoking completely before smoking completely stops you!   levaquin 500 mg daily x 7 days and then return for follow up cxr > if condition worsens go to er

## 2015-01-22 NOTE — Assessment & Plan Note (Signed)
Says thinks she was worse p stopped smoking so resumed  > 3 min discussion  I emphasized that although we never turn away smokers from the pulmonary clinic, we do ask that they understand that the recommendations that we make  won't work nearly as well in the presence of continued cigarette exposure.  In fact, we may very well  reach a point where we can't promise to help the patient if he/she can't quit smoking. (We can and will promise to try to help, we just can't promise what we recommend will really work)

## 2015-01-22 NOTE — Assessment & Plan Note (Signed)
Adequate control on present rx, reviewed > no change in rx needed

## 2015-01-22 NOTE — Telephone Encounter (Signed)
Pt c/o increased SOB, cough with clear foamy mucus and right lung pain since Saturday (x 3 days) Pt reports that right lung hurts with inspiration and is worse when coughing. Pt was concerned that it was indigestion and used Rolaids to try and get some relief and they did not help.  Pt denies any radiating chest pain - all pain is in right lung, denies arm pain or any other discomfort.  Pt advised that she may need to go to ED to be evaluated but that we will check with MW for rec's.  Please advise Dr Melvyn Novas if patient can be worked in today.

## 2015-01-22 NOTE — Assessment & Plan Note (Signed)
pfts 11/2011  0.86 (42%) ratio 35   Despite apparent pna/ resuming smoking she is relatively well compensated on laba/lama/ics rx chronically    The proper method of use, as well as anticipated side effects, of a metered-dose inhaler are discussed and demonstrated to the patient. Improved effectiveness after extensive coaching during this visit to a level of approximately  75% (Ti too short) and 90% with dpi  I had an extended discussion with the patient reviewing all relevant studies completed to date and  lasting 15 to 20 minutes of a 25 minute visit    Each maintenance medication was reviewed in detail including most importantly the difference between maintenance and prns and under what circumstances the prns are to be triggered using an action plan format that is not reflected in the computer generated alphabetically organized AVS.    Please see instructions for details which were reviewed in writing and the patient given a copy highlighting the part that I personally wrote and discussed at today's ov.

## 2015-01-22 NOTE — Telephone Encounter (Signed)
Yes, def needs to be seen today with cxr and all meds in hand

## 2015-01-22 NOTE — Assessment & Plan Note (Signed)
Abrupt onset/ intensely pleuritic cp x 48 h with as dz RUL in copd that is relatively well compensated   rec levaquin x 500 mg x 7 days then return for cxr

## 2015-01-26 ENCOUNTER — Telehealth: Payer: Self-pay | Admitting: Internal Medicine

## 2015-01-26 MED ORDER — PROMETHAZINE HCL 12.5 MG PO TABS: ORAL_TABLET | ORAL | Status: DC

## 2015-01-26 MED ORDER — PREDNISONE 10 MG PO TABS: ORAL_TABLET | ORAL | Status: DC

## 2015-01-26 NOTE — Telephone Encounter (Signed)
Patient notified. Rx sent to pharmacy. Nothing further needed.

## 2015-01-26 NOTE — Telephone Encounter (Signed)
rec  add phenergan 12.5 mg tablets with each norco so she can control the coughing - the abx have not had a chance to work yet and will need to complete 10 days total of the levaquin  Also add Prednisone 10 mg take  4 each am x 2 days,   2 each am x 2 days,  1 each am x 2 days and stop

## 2015-01-26 NOTE — Telephone Encounter (Signed)
Pt c/o still having high fever (99.9 - 101) with dry cough and Jamahl Lemmons mucus at times.  Pt states that she had vomiting all day Tuesday - thinks d/t new meds rx'd (Norco) for cough - pt has decreased this med dosing d/t side effect of vomiting. Still taking Levaquin 558m (2 pills left)  Appt on Monday 01/29/15 - may need to cancel this appt based on how she is feeling bc she is supposed to go on vacation. Pt is supposed to be leaving for the beach 01/27/15 and returning home 02/02/15 Trip is non-refundable and pt is wanting to go.  Please advise MW any rec's for patient or if OV needed today. Thanks.   Patient Instructions 01/22/15     For pain or cough use norco 5 -325 one every 4 hours as needed   Work on inhaler technique: relax and gently blow all the way out then take a nice smooth deep breath back in, triggering the inhaler at same time you start breathing in. Hold for up to 5 seconds if you can. Rinse and gargle with water when done  Only use your albuterol as a rescue medication to be used if you can't catch your breath by resting or doing a relaxed purse lip breathing pattern.  - The less you use it, the better it will work when you need it. - Ok to use up to 2 puffs every 4 hours if you must but call for immediate appointment if use goes up over your usual need - Don't leave home without it !! (think of it like the spare tire for your car)   Only use the nebulizer if you try the inhalers first and it doesn't work  The key is to stop smoking completely before smoking completely stops you!   levaquin 500 mg daily x 7 days and then return for follow up cxr > if condition worsens go to er

## 2015-01-28 ENCOUNTER — Encounter: Payer: Self-pay | Admitting: Internal Medicine

## 2015-01-29 ENCOUNTER — Ambulatory Visit: Payer: No Typology Code available for payment source | Admitting: Internal Medicine

## 2015-02-05 ENCOUNTER — Other Ambulatory Visit: Payer: Self-pay | Admitting: Internal Medicine

## 2015-02-06 ENCOUNTER — Ambulatory Visit (INDEPENDENT_AMBULATORY_CARE_PROVIDER_SITE_OTHER)
Admission: RE | Admit: 2015-02-06 | Discharge: 2015-02-06 | Disposition: A | Discharge status: Home / Self Care | Payer: 59 | Source: Ambulatory Visit | Attending: Internal Medicine | Admitting: Internal Medicine

## 2015-02-06 ENCOUNTER — Ambulatory Visit (INDEPENDENT_AMBULATORY_CARE_PROVIDER_SITE_OTHER): Payer: 59 | Admitting: Internal Medicine

## 2015-02-06 ENCOUNTER — Encounter: Payer: Self-pay | Admitting: Internal Medicine

## 2015-02-06 ENCOUNTER — Telehealth: Payer: Self-pay | Admitting: Internal Medicine

## 2015-02-06 VITALS — BP 128/80 | HR 102 | Ht 62.0 in | Wt 105.4 lb

## 2015-02-06 DIAGNOSIS — J189 Pneumonia, unspecified organism: Secondary | ICD-10-CM | POA: Diagnosis not present

## 2015-02-06 DIAGNOSIS — J449 Chronic obstructive pulmonary disease, unspecified: Secondary | ICD-10-CM

## 2015-02-06 DIAGNOSIS — F17210 Nicotine dependence, cigarettes, uncomplicated: Secondary | ICD-10-CM

## 2015-02-06 DIAGNOSIS — Z720 Tobacco use: Secondary | ICD-10-CM | POA: Diagnosis not present

## 2015-02-06 MED ORDER — BUDESONIDE-FORMOTEROL FUMARATE 160-4.5 MCG/ACT IN AERO: INHALATION_SPRAY | RESPIRATORY_TRACT | Status: DC

## 2015-02-06 NOTE — Progress Notes (Signed)
Quick Note:  Spoke with pt and notified of results per Dr. Wert. Pt verbalized understanding and denied any questions.  ______ 

## 2015-02-06 NOTE — Telephone Encounter (Signed)
See result note

## 2015-02-06 NOTE — Assessment & Plan Note (Signed)
>   3 m  I took an extended  opportunity with this patient to outline the consequences of continued cigarette use  in airway disorders based on all the data we have from the multiple national lung health studies (perfomed over decades at millions of dollars in cost)  indicating that smoking cessation, not choice of inhalers or physicians, is the most important aspect of care.   

## 2015-02-06 NOTE — Assessment & Plan Note (Addendum)
pfts 11/2011  0.86 (42%) ratio 35  -- 01/22/2015  p extensive coaching HFA effectiveness =    75% but 100% with dpi  Continues with severe copd clinically and still smoking/ relatively well compensated on symb/spiriva  I had an extended discussion with the patient reviewing all relevant studies completed to date and  lasting 15 to 20 minutes of a 25 minute visit    Each maintenance medication was reviewed in detail including most importantly the difference between maintenance and prns and under what circumstances the prns are to be triggered using an action plan format that is not reflected in the computer generated alphabetically organized AVS.    Please see instructions for details which were reviewed in writing and the patient given a copy highlighting the part that I personally wrote and discussed at today's ov.

## 2015-02-06 NOTE — Telephone Encounter (Signed)
Spoke with Opal Sidles at [facility] for a call report on pt's cxr: CXR shows COPD with increased prominence in parenchymal and pleural densitites on R side, may reflect PNA unresponsive to therapy.  Central obstructive R hilar mass with postobstructive atelectasis/pna may be present.  CT chest evaluation recommended.    The final report is in [facility].    Forwarding to MW.  Please advise.  Thanks!

## 2015-02-06 NOTE — Assessment & Plan Note (Signed)
Clinically resolved but radiology concerned about ca > rec repeat in 4 weeks as this is most likely slow to resolve pna radiographically in pt with severe copd

## 2015-02-06 NOTE — Patient Instructions (Signed)
Please remember to go to the   x-ray department downstairs for your tests - we will call you with the results when they are available.  The key is to stop smoking completely before smoking completely stops you!  Please schedule a follow up visit in 3 months but call sooner if needed

## 2015-02-06 NOTE — Progress Notes (Signed)
Quick Note:  lmtcb ______ 

## 2015-02-06 NOTE — Progress Notes (Signed)
Subjective:    Patient ID: [name], female    DOB: [birth year]   MRN: [id number]  HPI #Smoker Smoker - STarted age 69. 25 pack per day. Quit end 2012 for first time with help of wellbutrin and patch.  But recently 7 year old granddaughter from congential liver disease ("cirrhosis") moved back with her and since then smoking. Patient is primary health care provider because her daughter and 61 year old do not get along.  Smokes 1 ppd (used to smoke 2 ppd)  #Chronic cough  - Stop ACE inhibitor fall 2013  #Lung nodule - 61m RLL March 2014  -  CT scan chest 08/19/2012: 865mnoncalcified right lower lobe nodule with irregular margins.  - PET scan 09/20/12 - indeterminate uptake   #COPD Gold stage III PFT 12/10/2011  - Gold stage 3 copd - fev1 0.8L/42%, 20% BD respinse. TLC 133%, DLCO 46% - Walk test in 2013   - pulse ox 88% at first lap 185 feet but increased to 91% at 3rd lap end  #AECOPD  - OV 05/12/2012 - treated with Levaquin and prednisone - March 2014: Failed aecopd office  treatment on 08/17/2012 and hospitalized between 08/19/2012 in 08/21/2012; discharged on 10 day prednisone taper    OV 08/27/2012  COPD: Failed aecopd office  treatment on 08/17/2012 and hospitalized between 08/19/2012 in 08/21/2012; discharged on 10 day prednisone taper. Currently feeling like she is slowly limping back to normal. FEels she needs another week off; STil lwith fatigue  New RLL nodule 60m260mdiagnosed while in hospital for aecopd.  She does not believe this is potentially lung cancer. She believes she was told `nothing to worry about` but she understands need for followup. She is not interested in intervetnion curently    REC  COPD COPD - Glad you slowly getting better after recent flareup - Continue the medications and advice of the hospitalist who treated you - Continue your regular medications for COPD - Take another week off and slowly work on gaining her strength back - When  financially possible I would recommend you take oxygen at night - When time allows I would recommend you to pulmonary rehabilitation - Start N. acetylcysteine 600 mg twice a day    #Lung nodule - Do PET scan into 10 weeks  #Smoking - Really important to quit smoking for all the various reasons as discussed  #Followup - 10 weeks with pet scan results   OV 01/06/2013 Followup for the above.  -In terms of lung nodule: We will make sure there is a followup scan of the chest in mid August 2014  - In terms of COPD: Details of symptoms are listed in the table below. COPD cat score is 23 and is around baseline but she still has significant symptoms. I noticed that she is on multiple different inhalers and has poor understanding of these. She is actually taking Combivent, Advair and Spiriva. I tried explained the differences between Spiriva and Combivent and why she should not overlap these inhalers but she got upset that is to restrict her inhaler use her logic is that when she takes Combivent it helps immediately and that Spiriva is only once a day and she does not seem to get a huge benefit from it. When I suggested that she switch Advair twice a day  to Brio once a day she got upset thinking that in switching twice a day inhaler which is superior because it is twice a day to once a day  inhaler which is potentially inferior because it once a day  Terms of smoking: She continues to smoke but acknowledges that it is important for to quit but really has no plan or does not want help rec #COPD  - stop combivent as needed. Instead take pro-air as needed  - stop advair. Instead take BREO 1 puff at night  - continue spiriva  - Please take prednisone 40 mg x1 day, then 30 mg x1 day, then 20 mg x1 day, then 10 mg x1 day, and then 5 mg x1 day and stop    #Lung nodule - Do CT scan chest mid august 2014; Weill ensure there is order  #Smoking - Really important to quit smoking for all the various  reasons as discussed  #Followup - 4 weeks ith CAT score and CT results  - see NP Tammy for followup     01/26/2013 f/u ov/Mirza Fessel re abn ct chest/still smoking  Chief Complaint  Patient presents with  . Acute Visit    Pt states having slight increased SOB and hemoptysis x 5 days. She states recently dxed with PNA and has not started on tx for this yet.   at baseline says sputum yellow to green each am x years then abruptly Started feeling 8/4 weak, more sob, felt hots/ sweats/ abruptly worse 8/5 with pain R post chest and then bloody sputum on 8/9.  Variable dysphagia, sticks in throat. No obvious asp events Had dental work 2 weeks before onset  Imp ? Asp pna sup segment RLL  rec augmentin 875 twice daily x 10 days The key is to stop smoking completely before smoking completely stops you!   02/11/2013 f/u ov/Sharren Schnurr  Chief Complaint  Patient presents with  . Follow-up    Pt reports breathing back at her baseline. Her cough has improved with no more hemoptysis and occ white sputum.   Maint on spiriva/ advair and rare need for combivent, doe with more than slow adls rec The key is to stop smoking completely before smoking completely stops you!  Only use your albuterol (proaire) as a rescue medication to be used if you can't catch your breath by resting or doing a relaxed purse lip breathing pattern. The less you use it, the better it will work when you need it.  See Ramaswamy in 3 months with a follow cxr > did not return as rec    06/17/2013 f/u ov/Azya Barbero re: aecopd Chief Complaint  Patient presents with  . Follow-up    Increased SOB, cough and wheezing x1 week   zpak called in on 06/13/13 and mucus turned white but still very congested cough. Using advair/ spiriva as maint and now increase need for duoneb up to twice daily  rec Prednisone 10 mg take  4 each am x 2 days, 3 x 2 days,   2 each am x 2 days,  1 each am x 2 days and stop  Only use your albuterol (proaire) as a rescue  medication  For cough >  mucinex or mucinex dm 1200 mg every 12 hours   09/26/2013 f/u ov/Kaveh Kissinger re: still smoking plus advair/ spiriva very confused with timing of maint and prns Chief Complaint  Patient presents with  . Follow-up    Breathing unchanged since the last visit. She also c/o chest tightness for the past 3 days. Cough is prod with large amounts of clear sputum. She uses rescue inhaler approx 2 times per wk on average and also using duoneb a  few times per day.   doe x > room to room walking/ not using mucinex as rec, no purulent sputum Chest tightness better p saba  >>pred taper   10/11/2013 Follow up  Returns for follow up for recent COPD flare.  Was given steroid taper last ov. Feeling much better.  Reports breathing is improved since last ov, Doing well with the Symbicort.  no new complaints. Discussed smoking cessatioin  No fever, chest pain, orthopnea, or edema  CXR last ov showed improved right suprahilar infiltrate.  rec Continue on Symbicort and Spiriva , rinse after use.  MOST IMPORTANT GOAL IS TO QUIT SMOKING.  follow up Dr. Melvyn Novas  In 3 months and As needed      01/16/2014 f/u ov/Nikolaj Geraghty re: GOLD III COPD / insurance requiring trial of incruse over spiriva Chief Complaint  Patient presents with  . Follow-up    Pt states that overall her breathing is doing well. Some increased chest tightness last wk which she relates to humid weather- had to use rescue inhaler a few times last wk.   rec Add incruse 2 puffs off one click each am only,  And only after the am  symbicort Work on inhaler technique:    Prevnar given today.    04/05/2014 f/u ov/Toussaint Golson re: copd GOLD III still smoking  Chief Complaint  Patient presents with  . Follow-up    Breathing is about the same, has good days and bad days.  Using albuterol inhaler about once per day.      continues to struggle with understanding insurance restrictions / alternatives >>wellbutrin rx , tiral of BREO instead of  symbicort   06/30/2014 Acute OV  Complains of cough, congestion, wheezing and tightness for 1 week. Was called in [place] and pred taper on 1/11. Says she is some better but still wheezing. Did notice O2 sats dropped 80s when walking initially.  Today in ofifce O2 sats at rest >90% on RA, walking dropped to , on 2l/m kept sats >90%  She is on O2 at home  At bedtime   No chest pain, orthopnea, edema or fever. Remains on Symbicort, did not like BREO .  Still smoking . rec Prednisone taper over next week  Finish ZPack .  Chest xray today .  Begin O2 at 2l/m with activity and continue on O2 At bedtime  .  Mucinex DM Twice daily  As needed  Cough/congestion    08/11/2014 f/u ov/Wess Baney re: GOLD III still smoking / spiriva/ symbicort  Chief Complaint  Patient presents with  . Follow-up    Pt states that her breathing is doing well.  Her cough has improved.   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 Follow-up    Breathing is unchanged. She has not needed rescue inhaler or neb.   doe x more than nl pace or up inclines  = MMRC 1 and not using 02  rec  schedule overnight oximetry on room air  to see if can stop your nocturnal 02 > never happened but remained on 2lpm     01/22/2015 acute ov/Jonet Mathies re: CAP  in pt with copd GOLD III  / still smoking 02 2lpm  at hs Chief Complaint  Patient presents with  . Acute Visit    Pt c/o increased SOB and right CP x 3 days. She states she notices wheezing with moderate exertion.   Acute onset  Gradually worseng R pleuritic cp since am 01/20/15 p resuming smoking 12/29/14 / no real increase in saba hfa/ no neb assoc with congested cough with white mucus and no  rigors / no sweats or documented fever cxr ? pna RUL >  For pain or cough use norco 5 -325 one every 4 hours as needed  Work on inhaler technique:   Only use your albuterol as a rescue medication Only use the nebulizer if you try the inhalers first and it doesn't work The key is to stop smoking completely before smoking completely stops you!  levaquin 500 mg daily x 7 days     02/06/2015 f/u ov/Karmen Altamirano re: copd GOLD III/  02 hs only and still smoking  Chief Complaint  Patient presents with  . Follow-up    Pt states that her breathing has improved some and CP has resolved. She is using rescue inhaler 1 x per day on average.   cp gone,  Did have fever > gone, some green mucus > gone  Doe back to baseline = MMRC 1/ 02 at hs only     No obvious day to day or daytime variabilty or assoc chest tightness,  overt sinus or hb symptoms. No unusual exp hx or h/o childhood pna/ asthma or knowledge of premature birth.  Sleeping ok without nocturnal  or early am exacerbation  of respiratory  c/o's or need for noct saba. Also denies any obvious fluctuation of symptoms with weather or environmental changes or other aggravating or alleviating factors except as outlined above   Current Medications, Allergies, Complete Past Medical History, Past Surgical History, Family History, and Social History were reviewed in [employer] record.  ROS  The following are not active complaints unless bolded sore throat, dysphagia, dental problems, itching, sneezing,  nasal congestion or excess/ purulent secretions, ear ache,   fever, chills, sweats, unintended wt loss,   exertional cp, hemoptysis,  orthopnea pnd or leg swelling, presyncope, palpitations, heartburn, abdominal pain, anorexia, nausea, vomiting, diarrhea  or change in bowel or urinary habits, change in stools or urine, dysuria,hematuria,  rash, arthralgias, visual complaints, headache, numbness weakness or ataxia or problems with walking or  coordination,  change in mood/affect or memory.             Objective:   Physical Exam   Chronically ill amb nad    09/26/2013        101>102 10/11/2013 > 01/16/2014  99 > 04/06/2014  103 >105 06/30/2014 > 08/11/2014  101 > 11/09/2014  104 > 01/22/2015 106  > 02/06/2015 105   HEENT mild turbinate edema.  Oropharynx no thrush or excess pnd or cobblestoning.  No JVD or cervical adenopathy. Mild accessory muscle hypertrophy. Trachea midline, nl thryroid. Chest was hyperinflated by percussion with diminished breath sounds, no wheezing . Marland Kitchen Regular rate and rhythm without murmur gallop or rub or increase P2 or edema.  Abd: no hsm, nl excursion. Ext warm without cyanosis or clubbing.  MS  Nl rom/ no deformities     I personally reviewed images and agree with radiology impression as follows:  CXR: 02/06/2015  COPD with increased prominence of parenchymal and pleural densities on the  right.          Assessment & Plan:

## 2015-02-09 ENCOUNTER — Ambulatory Visit: Payer: No Typology Code available for payment source | Admitting: Internal Medicine

## 2015-03-08 ENCOUNTER — Ambulatory Visit (INDEPENDENT_AMBULATORY_CARE_PROVIDER_SITE_OTHER)
Admission: RE | Admit: 2015-03-08 | Discharge: 2015-03-08 | Disposition: A | Discharge status: Home / Self Care | Payer: 59 | Source: Ambulatory Visit | Attending: Internal Medicine | Admitting: Internal Medicine

## 2015-03-08 ENCOUNTER — Other Ambulatory Visit (INDEPENDENT_AMBULATORY_CARE_PROVIDER_SITE_OTHER): Payer: 59

## 2015-03-08 ENCOUNTER — Ambulatory Visit (INDEPENDENT_AMBULATORY_CARE_PROVIDER_SITE_OTHER): Payer: 59 | Admitting: Internal Medicine

## 2015-03-08 ENCOUNTER — Encounter: Payer: Self-pay | Admitting: Internal Medicine

## 2015-03-08 VITALS — BP 104/70 | HR 70 | Ht 62.0 in | Wt 106.0 lb

## 2015-03-08 DIAGNOSIS — J189 Pneumonia, unspecified organism: Secondary | ICD-10-CM

## 2015-03-08 DIAGNOSIS — J449 Chronic obstructive pulmonary disease, unspecified: Secondary | ICD-10-CM

## 2015-03-08 LAB — BASIC METABOLIC PANEL
BUN: 19 mg/dL (ref 6–23)
CHLORIDE: 101 meq/L (ref 96–112)
CO2: 29 meq/L (ref 19–32)
CREATININE: 0.69 mg/dL (ref 0.40–1.20)
Calcium: 10.4 mg/dL (ref 8.4–10.5)
GFR: 89.66 mL/min (ref >60.00)
Glucose, Bld: 108 mg/dL — ABNORMAL HIGH (ref 70–99)
POTASSIUM: 4.1 meq/L (ref 3.5–5.1)
SODIUM: 139 meq/L (ref 135–145)

## 2015-03-08 NOTE — Patient Instructions (Signed)
Please see patient coordinator before you leave today  to schedule CT chest and I will call you with results

## 2015-03-08 NOTE — Progress Notes (Signed)
Subjective:    Patient ID: [name], female    DOB: [birth year]   MRN: [id number]  HPI #Smoker Smoker - STarted age 69. 25 pack per day. Quit end 2012 for first time with help of wellbutrin and patch.  But recently 7 year old granddaughter from congential liver disease ("cirrhosis") moved back with her and since then smoking. Patient is primary health care Balian Schaller because her daughter and 61 year old do not get along.  Smokes 1 ppd (used to smoke 2 ppd)  #Chronic cough  - Stop ACE inhibitor fall 2013  #Lung nodule - 61m RLL March 2014  -  CT scan chest 08/19/2012: 865mnoncalcified right lower lobe nodule with irregular margins.  - PET scan 09/20/12 - indeterminate uptake   #COPD Gold stage III PFT 12/10/2011  - Gold stage 3 copd - fev1 0.8L/42%, 20% BD respinse. TLC 133%, DLCO 46% - Walk test in 2013   - pulse ox 88% at first lap 185 feet but increased to 91% at 3rd lap end  #AECOPD  - OV 05/12/2012 - treated with Levaquin and prednisone - March 2014: Failed aecopd office  treatment on 08/17/2012 and hospitalized between 08/19/2012 in 08/21/2012; discharged on 10 day prednisone taper    OV 08/27/2012  COPD: Failed aecopd office  treatment on 08/17/2012 and hospitalized between 08/19/2012 in 08/21/2012; discharged on 10 day prednisone taper. Currently feeling like she is slowly limping back to normal. FEels she needs another week off; STil lwith fatigue  New RLL nodule 60m260mdiagnosed while in hospital for aecopd.  She does not believe this is potentially lung cancer. She believes she was told `nothing to worry about` but she understands need for followup. She is not interested in intervetnion curently    REC  COPD COPD - Glad you slowly getting better after recent flareup - Continue the medications and advice of the hospitalist who treated you - Continue your regular medications for COPD - Take another week off and slowly work on gaining her strength back - When  financially possible I would recommend you take oxygen at night - When time allows I would recommend you to pulmonary rehabilitation - Start N. acetylcysteine 600 mg twice a day    #Lung nodule - Do PET scan into 10 weeks  #Smoking - Really important to quit smoking for all the various reasons as discussed  #Followup - 10 weeks with pet scan results   OV 01/06/2013 Followup for the above.  -In terms of lung nodule: We will make sure there is a followup scan of the chest in mid August 2014  - In terms of COPD: Details of symptoms are listed in the table below. COPD cat score is 23 and is around baseline but she still has significant symptoms. I noticed that she is on multiple different inhalers and has poor understanding of these. She is actually taking Combivent, Advair and Spiriva. I tried explained the differences between Spiriva and Combivent and why she should not overlap these inhalers but she got upset that is to restrict her inhaler use her logic is that when she takes Combivent it helps immediately and that Spiriva is only once a day and she does not seem to get a huge benefit from it. When I suggested that she switch Advair twice a day  to Brio once a day she got upset thinking that in switching twice a day inhaler which is superior because it is twice a day to once a day  inhaler which is potentially inferior because it once a day  Terms of smoking: She continues to smoke but acknowledges that it is important for to quit but really has no plan or does not want help rec #COPD  - stop combivent as needed. Instead take pro-air as needed  - stop advair. Instead take BREO 1 puff at night  - continue spiriva  - Please take prednisone 40 mg x1 day, then 30 mg x1 day, then 20 mg x1 day, then 10 mg x1 day, and then 5 mg x1 day and stop    #Lung nodule - Do CT scan chest mid august 2014; Weill ensure there is order  #Smoking - Really important to quit smoking for all the various  reasons as discussed  #Followup - 4 weeks ith CAT score and CT results  - see NP Tammy for followup     01/26/2013 f/u ov/Wert re abn ct chest/still smoking  Chief Complaint  Patient presents with  . Acute Visit    Pt states having slight increased SOB and hemoptysis x 5 days. She states recently dxed with PNA and has not started on tx for this yet.   at baseline says sputum yellow to green each am x years then abruptly Started feeling 8/4 weak, more sob, felt hots/ sweats/ abruptly worse 8/5 with pain R post chest and then bloody sputum on 8/9.  Variable dysphagia, sticks in throat. No obvious asp events Had dental work 2 weeks before onset  Imp ? Asp pna sup segment RLL  rec augmentin 875 twice daily x 10 days The key is to stop smoking completely before smoking completely stops you!   02/11/2013 f/u ov/Wert  Chief Complaint  Patient presents with  . Follow-up    Pt reports breathing back at her baseline. Her cough has improved with no more hemoptysis and occ white sputum.   Maint on spiriva/ advair and rare need for combivent, doe with more than slow adls rec The key is to stop smoking completely before smoking completely stops you!  Only use your albuterol (proaire) as a rescue medication to be used if you can't catch your breath by resting or doing a relaxed purse lip breathing pattern. The less you use it, the better it will work when you need it.  See Ramaswamy in 3 months with a follow cxr > did not return as rec    06/17/2013 f/u ov/Wert re: aecopd Chief Complaint  Patient presents with  . Follow-up    Increased SOB, cough and wheezing x1 week   zpak called in on 06/13/13 and mucus turned white but still very congested cough. Using advair/ spiriva as maint and now increase need for duoneb up to twice daily  rec Prednisone 10 mg take  4 each am x 2 days, 3 x 2 days,   2 each am x 2 days,  1 each am x 2 days and stop  Only use your albuterol (proaire) as a rescue  medication  For cough >  mucinex or mucinex dm 1200 mg every 12 hours   09/26/2013 f/u ov/Wert re: still smoking plus advair/ spiriva very confused with timing of maint and prns Chief Complaint  Patient presents with  . Follow-up    Breathing unchanged since the last visit. She also c/o chest tightness for the past 3 days. Cough is prod with large amounts of clear sputum. She uses rescue inhaler approx 2 times per wk on average and also using duoneb a  few times per day.   doe x > room to room walking/ not using mucinex as rec, no purulent sputum Chest tightness better p saba  >>pred taper   10/11/2013 Follow up  Returns for follow up for recent COPD flare.  Was given steroid taper last ov. Feeling much better.  Reports breathing is improved since last ov, Doing well with the Symbicort.  no new complaints. Discussed smoking cessatioin  No fever, chest pain, orthopnea, or edema  CXR last ov showed improved right suprahilar infiltrate.  rec Continue on Symbicort and Spiriva , rinse after use.  MOST IMPORTANT GOAL IS TO QUIT SMOKING.  follow up Dr. Melvyn Novas  In 3 months and As needed      01/16/2014 f/u ov/Wert re: GOLD III COPD / insurance requiring trial of incruse over spiriva Chief Complaint  Patient presents with  . Follow-up    Pt states that overall her breathing is doing well. Some increased chest tightness last wk which she relates to humid weather- had to use rescue inhaler a few times last wk.   rec Add incruse 2 puffs off one click each am only,  And only after the am  symbicort Work on inhaler technique:    Prevnar given today.    04/05/2014 f/u ov/Wert re: copd GOLD III still smoking  Chief Complaint  Patient presents with  . Follow-up    Breathing is about the same, has good days and bad days.  Using albuterol inhaler about once per day.      continues to struggle with understanding insurance restrictions / alternatives >>wellbutrin rx , tiral of BREO instead of  symbicort   06/30/2014 Acute OV  Complains of cough, congestion, wheezing and tightness for 1 week. Was called in [place] and pred taper on 1/11. Says she is some better but still wheezing. Did notice O2 sats dropped 80s when walking initially.  Today in ofifce O2 sats at rest >90% on RA, walking dropped to , on 2l/m kept sats >90%  She is on O2 at home  At bedtime   No chest pain, orthopnea, edema or fever. Remains on Symbicort, did not like BREO .  Still smoking . rec Prednisone taper over next week  Finish ZPack .  Chest xray today .  Begin O2 at 2l/m with activity and continue on O2 At bedtime  .  Mucinex DM Twice daily  As needed  Cough/congestion    08/11/2014 f/u ov/Wert re: GOLD III still smoking / spiriva/ symbicort  Chief Complaint  Patient presents with  . Follow-up    Pt states that her breathing is doing well.  Her cough has improved.   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 Follow-up    Breathing is unchanged. She has not needed rescue inhaler or neb.   doe x more than nl pace or up inclines  = MMRC 1 and not using 02  rec  schedule overnight oximetry on room air  to see if can stop your nocturnal 02 > never happened but remained on 2lpm     01/22/2015 acute ov/Wert re: CAP  in pt with copd GOLD III  / still smoking 02 2lpm  at hs Chief Complaint  Patient presents with  . Acute Visit    Pt c/o increased SOB and right CP x 3 days. She states she notices wheezing with moderate exertion.   Acute onset  Gradually worseng R pleuritic cp since am 01/20/15 p resuming smoking 12/29/14 / no real increase in saba hfa/ no neb assoc with congested cough with white mucus and no  rigors / no sweats or documented fever cxr ? pna RUL >  For pain or cough use norco 5 -325 one every 4 hours as needed  Work on inhaler technique:   Only use your albuterol as a rescue medication Only use the nebulizer if you try the inhalers first and it doesn't work The key is to stop smoking completely before smoking completely stops you!  levaquin 500 mg daily x 7 days     02/06/2015 f/u ov/Wert re: copd GOLD III/  02 hs only and still smoking  Chief Complaint  Patient presents with  . Follow-up    Pt states that her breathing has improved some and CP has resolved. She is using rescue inhaler 1 x per day on average.   cp gone,  Did have fever > gone, some green mucus > gone  Doe back to baseline = MMRC 1/ 02 at hs only  rec Please remember to go to the   x-ray department downstairs for your tests - we will call you with the results when they are available. The key is to stop smoking completely before smoking completely stops you!    03/08/2015 f/u ov/Wert re: f/u RUL / still smoking / on symb/spiriva  And  02 hs only  Chief Complaint  Patient presents with  . Follow-up    Pt states her breathing has improved some. She has had more cough the past few days- prod with minimal green sputum.     Doe continued = MMRC1 = can walk nl pace, flat grade, can't hurry or go uphills or steps s sob    No obvious day to day or daytime variabilty or assoc chest tightness,  overt sinus or hb symptoms. No unusual exp hx or h/o childhood pna/ asthma or knowledge of premature birth.  Sleeping ok without nocturnal  or early am exacerbation  of respiratory  c/o's or need for noct saba. Also denies any obvious fluctuation of symptoms with weather or environmental changes or other aggravating or alleviating factors except as outlined above   Current Medications, Allergies, Complete Past Medical History, Past Surgical History, Family History, and Social History were reviewed in [employer] [employer] record.  ROS  The following are not active complaints unless bolded sore throat, dysphagia, dental problems, itching, sneezing,  nasal congestion or excess/ purulent secretions, ear ache,   fever, chills, sweats, unintended wt loss,   exertional cp, hemoptysis,  orthopnea pnd or leg swelling, presyncope, palpitations, heartburn, abdominal pain, anorexia, nausea, vomiting, diarrhea  or change in bowel or urinary habits, change in stools or urine, dysuria,hematuria,  rash, arthralgias, visual complaints, headache, numbness weakness or ataxia or problems with walking or coordination,  change in mood/affect or memory.             Objective:   Physical Exam   Chronically ill amb nad    09/26/2013  101>102 10/11/2013 > 01/16/2014  99 > 04/06/2014  103 >105 06/30/2014 > 08/11/2014  101 > 11/09/2014  104 >  01/22/2015 106  > 02/06/2015 105 > 03/08/2015 106   HEENT mild turbinate edema.  Oropharynx no thrush or excess pnd or cobblestoning.  No JVD or cervical adenopathy. Mild accessory muscle hypertrophy. Trachea midline, nl thryroid. Chest was hyperinflated by percussion with diminished breath sounds, no wheezing . Marland Kitchen Regular rate and rhythm without murmur gallop or rub or increase P2 or edema.  Abd: no hsm, nl excursion. Ext warm without cyanosis or clubbing.  MS  Nl rom/ no deformities     I personally reviewed images and agree with radiology impression as follows:  CXR:  03/08/2015 Progressive right upper lobe infiltrate and atelectasis. Although component of underlying pleural parenchymal scarring is most likely present, active pneumonia and/or underlying mass lesion cannot be excluded. Some degree of cavitation may be presentt.          Assessment & Plan:

## 2015-03-12 NOTE — Assessment & Plan Note (Signed)
pfts 11/2011  0.86 (42%) ratio 35  -- 01/22/2015  p extensive coaching HFA effectiveness =    75% but 100% with dpi  Severe airflow obstruction at baseline and not a surgical candidate, re: on maximal therapy with  Laba/lama/ ics.  Nurse persistent green sputum suggests either bronchiectasis or sinus disease which need to be evaluated with CT scan of chest and sinus next  I had an extended discussion with the patient reviewing all relevant studies completed to date and  lasting 15 to 20 minutes of a 25 minute visit    Each maintenance medication was reviewed in detail including most importantly the difference between maintenance and prns and under what circumstances the prns are to be triggered using an action plan format that is not reflected in the computer generated alphabetically organized AVS.    Please see instructions for details which were reviewed in writing and the patient given a copy highlighting the part that I personally wrote and discussed at today's ov.

## 2015-03-12 NOTE — Assessment & Plan Note (Signed)
See cxr 01/22/2015 >>> levaquin 500 mg/d x 7 days >>>  - CT chest 03/08/2015 >>>   Discussed in detail all the  indications, usual  risks and alternatives  relative to the benefits with patient who agrees to proceed with conservative f/u as outlined

## 2015-03-13 ENCOUNTER — Ambulatory Visit (INDEPENDENT_AMBULATORY_CARE_PROVIDER_SITE_OTHER)
Admission: RE | Admit: 2015-03-13 | Discharge: 2015-03-13 | Disposition: A | Discharge status: Home / Self Care | Payer: 59 | Source: Ambulatory Visit | Attending: Internal Medicine | Admitting: Internal Medicine

## 2015-03-13 DIAGNOSIS — J189 Pneumonia, unspecified organism: Secondary | ICD-10-CM | POA: Diagnosis not present

## 2015-03-13 MED ORDER — IOHEXOL 300 MG/ML  SOLN
80.0000 mL | Freq: Once | INTRAMUSCULAR | Status: AC | PRN
  Administered 2015-03-13: 80 mL via INTRAVENOUS

## 2015-04-18 ENCOUNTER — Encounter: Payer: Self-pay | Admitting: Internal Medicine

## 2015-04-18 ENCOUNTER — Ambulatory Visit (INDEPENDENT_AMBULATORY_CARE_PROVIDER_SITE_OTHER): Payer: 59 | Admitting: Internal Medicine

## 2015-04-18 VITALS — BP 112/80 | HR 98 | Ht 62.0 in | Wt 106.6 lb

## 2015-04-18 DIAGNOSIS — F17210 Nicotine dependence, cigarettes, uncomplicated: Secondary | ICD-10-CM

## 2015-04-18 DIAGNOSIS — J189 Pneumonia, unspecified organism: Secondary | ICD-10-CM

## 2015-04-18 DIAGNOSIS — J9611 Chronic respiratory failure with hypoxia: Secondary | ICD-10-CM

## 2015-04-18 DIAGNOSIS — J449 Chronic obstructive pulmonary disease, unspecified: Secondary | ICD-10-CM

## 2015-04-18 DIAGNOSIS — Z720 Tobacco use: Secondary | ICD-10-CM | POA: Diagnosis not present

## 2015-04-18 NOTE — Progress Notes (Signed)
Subjective:    Patient ID: [name], female    DOB: [birth year]   MRN: [id number]  HPI #Smoker Smoker - STarted age 69. 25 pack per day. Quit end 2012 for first time with help of wellbutrin and patch.  But recently 7 year old granddaughter from congential liver disease ("cirrhosis") moved back with her and since then smoking. Patient is primary health care provider because her daughter and 69 year old do not get along.  Smokes 1 ppd (used to smoke 2 ppd)  #Chronic cough  - Stop ACE inhibitor fall 2013  #Lung nodule - 61m RLL March 2014  -  CT scan chest 08/19/2012: 865mnoncalcified right lower lobe nodule with irregular margins.  - PET scan 09/20/12 - indeterminate uptake   #COPD Gold stage III PFT 12/10/2011  - Gold stage 3 copd - fev1 0.8L/42%, 20% BD respinse. TLC 133%, DLCO 46% - Walk test in 2013   - pulse ox 88% at first lap 185 feet but increased to 91% at 3rd lap end  #AECOPD  - OV 05/12/2012 - treated with Levaquin and prednisone - March 2014: Failed aecopd office  treatment on 08/17/2012 and hospitalized between 08/19/2012 in 08/21/2012; discharged on 10 day prednisone taper    OV 08/27/2012  COPD: Failed aecopd office  treatment on 08/17/2012 and hospitalized between 08/19/2012 in 08/21/2012; discharged on 10 day prednisone taper. Currently feeling like she is slowly limping back to normal. FEels she needs another week off; STil lwith fatigue  New RLL nodule 60m260mdiagnosed while in hospital for aecopd.  She does not believe this is potentially lung cancer. She believes she was told `nothing to worry about` but she understands need for followup. She is not interested in intervetnion curently    REC  COPD COPD - Glad you slowly getting better after recent flareup - Continue the medications and advice of the hospitalist who treated you - Continue your regular medications for COPD - Take another week off and slowly work on gaining her strength back - When  financially possible I would recommend you take oxygen at night - When time allows I would recommend you to pulmonary rehabilitation - Start N. acetylcysteine 600 mg twice a day    #Lung nodule - Do PET scan into 10 weeks  #Smoking - Really important to quit smoking for all the various reasons as discussed  #Followup - 10 weeks with pet scan results   OV 01/06/2013 Followup for the above.  -In terms of lung nodule: We will make sure there is a followup scan of the chest in mid August 2014  - In terms of COPD: Details of symptoms are listed in the table below. COPD cat score is 23 and is around baseline but she still has significant symptoms. I noticed that she is on multiple different inhalers and has poor understanding of these. She is actually taking Combivent, Advair and Spiriva. I tried explained the differences between Spiriva and Combivent and why she should not overlap these inhalers but she got upset that is to restrict her inhaler use her logic is that when she takes Combivent it helps immediately and that Spiriva is only once a day and she does not seem to get a huge benefit from it. When I suggested that she switch Advair twice a day  to Brio once a day she got upset thinking that in switching twice a day inhaler which is superior because it is twice a day to once a day  inhaler which is potentially inferior because it once a day  Terms of smoking: She continues to smoke but acknowledges that it is important for to quit but really has no plan or does not want help rec #COPD  - stop combivent as needed. Instead take pro-air as needed  - stop advair. Instead take BREO 1 puff at night  - continue spiriva  - Please take prednisone 40 mg x1 day, then 30 mg x1 day, then 20 mg x1 day, then 10 mg x1 day, and then 5 mg x1 day and stop    #Lung nodule - Do CT scan chest mid august 2014; Weill ensure there is order  #Smoking - Really important to quit smoking for all the various  reasons as discussed  #Followup - 4 weeks ith CAT score and CT results  - see NP Tammy for followup     01/26/2013 f/u ov/Diezel Mazur re abn ct chest/still smoking  Chief Complaint  Patient presents with  . Acute Visit    Pt states having slight increased SOB and hemoptysis x 5 days. She states recently dxed with PNA and has not started on tx for this yet.   at baseline says sputum yellow to green each am x years then abruptly Started feeling 8/4 weak, more sob, felt hots/ sweats/ abruptly worse 8/5 with pain R post chest and then bloody sputum on 8/9.  Variable dysphagia, sticks in throat. No obvious asp events Had dental work 2 weeks before onset  Imp ? Asp pna sup segment RLL  rec augmentin 875 twice daily x 10 days The key is to stop smoking completely before smoking completely stops you!   02/11/2013 f/u ov/Ciera Beckum  Chief Complaint  Patient presents with  . Follow-up    Pt reports breathing back at her baseline. Her cough has improved with no more hemoptysis and occ white sputum.   Maint on spiriva/ advair and rare need for combivent, doe with more than slow adls rec The key is to stop smoking completely before smoking completely stops you!  Only use your albuterol (proaire) as a rescue medication to be used if you can't catch your breath by resting or doing a relaxed purse lip breathing pattern. The less you use it, the better it will work when you need it.  See Ramaswamy in 3 months with a follow cxr > did not return as rec    06/17/2013 f/u ov/Lorance Pickeral re: aecopd Chief Complaint  Patient presents with  . Follow-up    Increased SOB, cough and wheezing x1 week   zpak called in on 06/13/13 and mucus turned white but still very congested cough. Using advair/ spiriva as maint and now increase need for duoneb up to twice daily  rec Prednisone 10 mg take  4 each am x 2 days, 3 x 2 days,   2 each am x 2 days,  1 each am x 2 days and stop  Only use your albuterol (proaire) as a rescue  medication  For cough >  mucinex or mucinex dm 1200 mg every 12 hours   09/26/2013 f/u ov/Lashun Ramseyer re: still smoking plus advair/ spiriva very confused with timing of maint and prns Chief Complaint  Patient presents with  . Follow-up    Breathing unchanged since the last visit. She also c/o chest tightness for the past 3 days. Cough is prod with large amounts of clear sputum. She uses rescue inhaler approx 2 times per wk on average and also using duoneb a  few times per day.   doe x > room to room walking/ not using mucinex as rec, no purulent sputum Chest tightness better p saba  >>pred taper   10/11/2013 Follow up  Returns for follow up for recent COPD flare.  Was given steroid taper last ov. Feeling much better.  Reports breathing is improved since last ov, Doing well with the Symbicort.  no new complaints. Discussed smoking cessatioin  No fever, chest pain, orthopnea, or edema  CXR last ov showed improved right suprahilar infiltrate.  rec Continue on Symbicort and Spiriva , rinse after use.  MOST IMPORTANT GOAL IS TO QUIT SMOKING.  follow up Dr. Melvyn Novas  In 3 months and As needed      01/16/2014 f/u ov/Carolos Fecher re: GOLD III COPD / insurance requiring trial of incruse over spiriva Chief Complaint  Patient presents with  . Follow-up    Pt states that overall her breathing is doing well. Some increased chest tightness last wk which she relates to humid weather- had to use rescue inhaler a few times last wk.   rec Add incruse 2 puffs off one click each am only,  And only after the am  symbicort Work on inhaler technique:    Prevnar given today.    04/05/2014 f/u ov/Nannie Starzyk re: copd GOLD III still smoking  Chief Complaint  Patient presents with  . Follow-up    Breathing is about the same, has good days and bad days.  Using albuterol inhaler about once per day.      continues to struggle with understanding insurance restrictions / alternatives >>wellbutrin rx , tiral of BREO instead of  symbicort   06/30/2014 Acute OV  Complains of cough, congestion, wheezing and tightness for 1 week. Was called in [place] and pred taper on 1/11. Says she is some better but still wheezing. Did notice O2 sats dropped 80s when walking initially.  Today in ofifce O2 sats at rest >90% on RA, walking dropped to , on 2l/m kept sats >90%  She is on O2 at home  At bedtime   No chest pain, orthopnea, edema or fever. Remains on Symbicort, did not like BREO .  Still smoking . rec Prednisone taper over next week  Finish ZPack .  Chest xray today .  Begin O2 at 2l/m with activity and continue on O2 At bedtime  .  Mucinex DM Twice daily  As needed  Cough/congestion    08/11/2014 f/u ov/Adriahna Shearman re: GOLD III still smoking / spiriva/ symbicort  Chief Complaint  Patient presents with  . Follow-up    Pt states that her breathing is doing well.  Her cough has improved.   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 Follow-up    Breathing is unchanged. She has not needed rescue inhaler or neb.   doe x more than nl pace or up inclines  = MMRC 1 and not using 02  rec  schedule overnight oximetry on room air  to see if can stop your nocturnal 02 > never happened but remained on 2lpm     01/22/2015 acute ov/Shalaina Guardiola re: CAP  in pt with copd GOLD III  / still smoking 02 2lpm  at hs Chief Complaint  Patient presents with  . Acute Visit    Pt c/o increased SOB and right CP x 3 days. She states she notices wheezing with moderate exertion.   Acute onset  Gradually worseng R pleuritic cp since am 01/20/15 p resuming smoking 12/29/14 / no real increase in saba hfa/ no neb assoc with congested cough with white mucus and no  rigors / no sweats or documented fever cxr ? pna RUL >  For pain or cough use norco 5 -325 one every 4 hours as needed  Work on inhaler technique:   Only use your albuterol as a rescue medication Only use the nebulizer if you try the inhalers first and it doesn't work The key is to stop smoking completely before smoking completely stops you!  levaquin 500 mg daily x 7 days     02/06/2015 f/u ov/Dekota Kirlin re: copd GOLD III/  02 hs only and still smoking  Chief Complaint  Patient presents with  . Follow-up    Pt states that her breathing has improved some and CP has resolved. She is using rescue inhaler 1 x per day on average.   cp gone,  Did have fever > gone, some green mucus > gone  Doe back to baseline = MMRC 1/ 02 at hs only  rec Please remember to go to the x-ray department downstairs for your tests - we will call you with the results when they are available. The key is to stop smoking completely before smoking completely stops you!    03/08/2015 f/u ov/Shaheed Schmuck re: f/u RUL / still smoking / on symb/spiriva  And  02 hs only  Chief Complaint  Patient presents with  . Follow-up    Pt states her breathing has improved some. She has had more cough the past few days- prod with minimal green sputum.   rec CT chest > c/w resolving pna/ can't exclude ca    04/18/2015  f/u ov/Namya Voges re: GOLD III copd / symb/spiriva/ no need for saba 02 2lpm hs only Chief Complaint  Patient presents with  . Follow-up    Pt states her breathing is unchanged. Her cough has become non prod.   doe is actually [facility] = can't walk a nl pace on a flat grade s sob (at a slow pace can walk anywhere she wants but can't do steps or inclines s sob Cough is worse in am's but no purulent or bloody sputum or cp which was prev well localized to R Chest but cleared p rx for pna   No obvious day to day or daytime variabilty or assoc chest tightness,  overt sinus or hb symptoms. No unusual exp hx or h/o childhood pna/ asthma or  knowledge of premature birth.  Sleeping ok without nocturnal  or early am exacerbation  of respiratory  c/o's or need for noct saba. Also denies any obvious fluctuation of symptoms with weather or environmental changes or other aggravating or alleviating factors except as outlined above   Current Medications, Allergies, Complete Past Medical History, Past Surgical History, Family History, and Social History were reviewed in [employer] record.  ROS  The following are not active complaints unless bolded sore throat, dysphagia, dental problems, itching, sneezing,  nasal congestion or excess/ purulent secretions, ear ache,   fever, chills, sweats, unintended wt loss,   exertional cp, hemoptysis,  orthopnea pnd or leg swelling, presyncope,  palpitations, heartburn, abdominal pain, anorexia, nausea, vomiting, diarrhea  or change in bowel or urinary habits, change in stools or urine, dysuria,hematuria,  rash, arthralgias, visual complaints, headache, numbness weakness or ataxia or problems with walking or coordination,  change in mood/affect or memory.             Objective:   Physical Exam   Chronically ill amb nad    09/26/2013  101>102 10/11/2013 > 01/16/2014  99 > 04/06/2014  103 >105 06/30/2014 > 08/11/2014  101 > 11/09/2014  104 > 01/22/2015 106  > 02/06/2015 105 > 03/08/2015 106 > 04/18/2015  106   HEENT mild turbinate edema.  Oropharynx no thrush or excess pnd or cobblestoning.  No JVD or cervical adenopathy. Mild accessory muscle hypertrophy. Trachea midline, nl thryroid. Chest was hyperinflated by percussion with diminished breath sounds, no wheezing . Marland Kitchen Regular rate and rhythm without murmur gallop or rub or increase P2 or edema.  Abd: no hsm, nl excursion. Ext warm without cyanosis or clubbing.  MS  Nl rom/ no deformities     I personally reviewed images and agree with radiology impression as follows:  CT Chest  03/13/15  1. Persistent subpleural pleural and parenchymal  consolidation involving the right upper lobe compatible with residual pneumonia. Followup CT chest is recommended in 3 months following antibiotic therapy and resolution of patient's symptom to ensure resolution and exclude underlying malignancy. 2. Emphysema           Assessment & Plan:

## 2015-04-18 NOTE — Patient Instructions (Addendum)
The key is to stop smoking completely before smoking completely stops you!   Work on inhaler technique:  relax and gently blow all the way out then take a nice smooth deep breath back in, triggering the inhaler at same time you start breathing in.  Hold for up to 5 seconds if you can. Blow out thru nose. Rinse and gargle with water when done   No change in medications for now  Please schedule a follow up visit in 2 months but call sooner if needed with cxr on return

## 2015-04-19 NOTE — Assessment & Plan Note (Signed)
See cxr 01/22/2015 >>> levaquin 500 mg/d x 7 days >>>  - CT chest  03/13/15 1. Persistent subpleural pleural and parenchymal consolidation involving the right upper lobe compatible with residual pneumonia. Followup CT chest is recommended in 3 months following antibiotic therapy and resolution of patient's symptom to ensure resolution and exclude underlying malignancy. 2. Emphysema   Clinically this was pna assoc with R CP and if malignant it would not have likely resolved. Reviewed natural hx of pna in setting of emphysema and limited options to prove it was pna other than to just follow it as really no indication for fob here  Discussed in detail all the  indications, usual  risks and alternatives  relative to the benefits with patient who agrees to proceed with conservative f/u as outlined

## 2015-04-19 NOTE — Assessment & Plan Note (Signed)
>   63mDiscussed the risks and costs (both direct and indirect)  of smoking relative to the benefits of quitting but patient unwilling to commit at this point to a specific quit date.    Offered to help with quitting by use of chantix or referral to our [facility] the patient is ready.

## 2015-04-19 NOTE — Assessment & Plan Note (Addendum)
pfts 11/2011  0.86 (42%) ratio 35   The proper method of use, as well as anticipated side effects, of a metered-dose inhaler are discussed and demonstrated to the patient. Improved effectiveness after extensive coaching during this visit to a level of approximately  75% (Ti too short)   Adequate control on present rx, reviewed > no change in rx needed  X stop smoking (see sep a/p)  I had an extended discussion with the patient reviewing all relevant studies completed to date and  lasting 15 to 20 minutes of a 25 minute visit    Each maintenance medication was reviewed in detail including most importantly the difference between maintenance and prns and under what circumstances the prns are to be triggered using an action plan format that is not reflected in the computer generated alphabetically organized AVS.    Please see instructions for details which were reviewed in writing and the patient given a copy highlighting the part that I personally wrote and discussed at today's ov.

## 2015-04-19 NOTE — Assessment & Plan Note (Signed)
Exertional desats 06/30/2014 >O2 2lm rx with act  - 11/09/2014  Walked RA  2 laps @ 185 ft each stopped due to  Sat down to 88 % at nl pace but no sob  - ono RA ordered  11/13/2014 > not  done   rx as of 04/18/2015 use 02 with exercise as needed  but not adls but always 2lpm hs   Re check walking sats next ov but appears Adequate control on present rx, reviewed > no change in rx needed

## 2015-05-03 ENCOUNTER — Telehealth: Payer: Self-pay | Admitting: Internal Medicine

## 2015-05-03 MED ORDER — AZITHROMYCIN 250 MG PO TABS: 250.0000 mg | ORAL_TABLET | ORAL | Status: DC

## 2015-05-03 NOTE — Telephone Encounter (Signed)
zithromax sent and pt aware

## 2015-05-03 NOTE — Telephone Encounter (Signed)
zpak

## 2015-05-03 NOTE — Telephone Encounter (Signed)
Called and spoke with pt Pt c/o productive cough with thick green mucus, low grade fever of 99.7, wheezing, increased SOB, headache, and head and chest congestion x 3-4 days Pt would like rec from MW and something called into her pharmacy   Dr Melvyn Novas, please advise. Thanks

## 2015-05-08 ENCOUNTER — Telehealth: Payer: Self-pay | Admitting: Internal Medicine

## 2015-05-08 MED ORDER — AMOXICILLIN-POT CLAVULANATE 875-125 MG PO TABS: 1.0000 | ORAL_TABLET | Freq: Two times a day (BID) | ORAL | Status: DC

## 2015-05-08 NOTE — Telephone Encounter (Signed)
Patient notified of Dr. Gustavus Bryant recommendations. Rx sent to pharmacy. Nothing further needed. Closing encounter

## 2015-05-08 NOTE — Telephone Encounter (Signed)
Augmentin 875 mg take one pill twice daily  X 10 days - take at breakfast and supper with large glass of water.  It would help reduce the usual side effects (diarrhea and yeast infections) if you ate cultured yogurt at lunch.  

## 2015-05-08 NOTE — Telephone Encounter (Signed)
Patient said she finished Zpak yesterday and she is still coughing up thick phlegm, lighter shade of green now.   Pharmacy: [facility]  Allergies  Allergen Reactions  . Tramadol Other (See Comments)    GI UPSET

## 2015-06-04 ENCOUNTER — Other Ambulatory Visit: Payer: Self-pay | Admitting: Internal Medicine

## 2015-06-04 MED ORDER — BUDESONIDE-FORMOTEROL FUMARATE 160-4.5 MCG/ACT IN AERO
INHALATION_SPRAY | RESPIRATORY_TRACT | Status: DC
Start: 2015-06-04 — End: 2015-11-14

## 2015-06-04 MED ORDER — TIOTROPIUM BROMIDE MONOHYDRATE 18 MCG IN CAPS: ORAL_CAPSULE | RESPIRATORY_TRACT | Status: DC

## 2015-06-15 ENCOUNTER — Telehealth: Payer: Self-pay | Admitting: Internal Medicine

## 2015-06-15 ENCOUNTER — Encounter: Payer: Self-pay | Admitting: Internal Medicine

## 2015-06-15 ENCOUNTER — Ambulatory Visit (INDEPENDENT_AMBULATORY_CARE_PROVIDER_SITE_OTHER)
Admission: RE | Admit: 2015-06-15 | Discharge: 2015-06-15 | Disposition: A | Discharge status: Home / Self Care | Payer: 59 | Source: Ambulatory Visit | Attending: Internal Medicine | Admitting: Internal Medicine

## 2015-06-15 ENCOUNTER — Ambulatory Visit (INDEPENDENT_AMBULATORY_CARE_PROVIDER_SITE_OTHER): Payer: 59 | Admitting: Internal Medicine

## 2015-06-15 VITALS — HR 88 | Ht 62.0 in | Wt 102.8 lb

## 2015-06-15 DIAGNOSIS — J189 Pneumonia, unspecified organism: Secondary | ICD-10-CM

## 2015-06-15 DIAGNOSIS — J449 Chronic obstructive pulmonary disease, unspecified: Secondary | ICD-10-CM

## 2015-06-15 DIAGNOSIS — F17210 Nicotine dependence, cigarettes, uncomplicated: Secondary | ICD-10-CM

## 2015-06-15 DIAGNOSIS — Z720 Tobacco use: Secondary | ICD-10-CM | POA: Diagnosis not present

## 2015-06-15 MED ORDER — PREDNISONE 10 MG PO TABS: ORAL_TABLET | ORAL | Status: DC

## 2015-06-15 MED ORDER — AZITHROMYCIN 250 MG PO TABS: 250.0000 mg | ORAL_TABLET | ORAL | Status: DC

## 2015-06-15 NOTE — Telephone Encounter (Signed)
Called and spoke pt. Pt is requesting that pred taper and zpak be sent to [facility] on [address]. She states that the med's were sent to [facility] instead. I explained to the patient that I would cancel the ones at [facility] and resend them to [facility]. Pt voiced understanding and had no further questions.   Called and spoke with [facility] with [facility]. I canceled both the pred taper and zpak per pt's request.   I resent both rx's to [facility]. Nothing further needed.

## 2015-06-15 NOTE — Patient Instructions (Signed)
Work on inhaler technique:  relax and gently blow all the way out then take a nice smooth deep breath back in, triggering the inhaler at same time you start breathing in.  Hold for up to 5 seconds if you can. Blow out thru nose. Rinse and gargle with water when done  The key is to stop smoking completely before smoking completely stops you!   Please schedule a follow up visit in 3 months but call sooner if needed

## 2015-06-15 NOTE — Progress Notes (Signed)
Subjective:    Patient ID: [name], female    DOB: [birth year]   MRN: [id number]  HPI #Smoker Smoker - STarted age 70. 25 pack per day. Quit end 2012 for first time with help of wellbutrin and patch.  But recently 7 year old granddaughter from congential liver disease ("cirrhosis") moved back with her and since then smoking. Patient is primary health care provider because her daughter and 61 year old do not get along.  Smokes 1 ppd (used to smoke 2 ppd)  #Chronic cough  - Stop ACE inhibitor fall 2013  #Lung nodule - 61m RLL March 2014  -  CT scan chest 08/19/2012: 865mnoncalcified right lower lobe nodule with irregular margins.  - PET scan 09/20/12 - indeterminate uptake   #COPD Gold stage III PFT 12/10/2011  - Gold stage 3 copd - fev1 0.8L/42%, 20% BD respinse. TLC 133%, DLCO 46% - Walk test in 2013   - pulse ox 88% at first lap 185 feet but increased to 91% at 3rd lap end  #AECOPD  - OV 05/12/2012 - treated with Levaquin and prednisone - March 2014: Failed aecopd office  treatment on 08/17/2012 and hospitalized between 08/19/2012 in 08/21/2012; discharged on 10 day prednisone taper    OV 08/27/2012  COPD: Failed aecopd office  treatment on 08/17/2012 and hospitalized between 08/19/2012 in 08/21/2012; discharged on 10 day prednisone taper. Currently feeling like she is slowly limping back to normal. FEels she needs another week off; STil lwith fatigue  New RLL nodule 60m260mdiagnosed while in hospital for aecopd.  She does not believe this is potentially lung cancer. She believes she was told `nothing to worry about` but she understands need for followup. She is not interested in intervetnion curently    REC  COPD COPD - Glad you slowly getting better after recent flareup - Continue the medications and advice of the hospitalist who treated you - Continue your regular medications for COPD - Take another week off and slowly work on gaining her strength back - When  financially possible I would recommend you take oxygen at night - When time allows I would recommend you to pulmonary rehabilitation - Start N. acetylcysteine 600 mg twice a day    #Lung nodule - Do PET scan into 10 weeks  #Smoking - Really important to quit smoking for all the various reasons as discussed  #Followup - 10 weeks with pet scan results   OV 01/06/2013 Followup for the above.  -In terms of lung nodule: We will make sure there is a followup scan of the chest in mid August 2014  - In terms of COPD: Details of symptoms are listed in the table below. COPD cat score is 23 and is around baseline but she still has significant symptoms. I noticed that she is on multiple different inhalers and has poor understanding of these. She is actually taking Combivent, Advair and Spiriva. I tried explained the differences between Spiriva and Combivent and why she should not overlap these inhalers but she got upset that is to restrict her inhaler use her logic is that when she takes Combivent it helps immediately and that Spiriva is only once a day and she does not seem to get a huge benefit from it. When I suggested that she switch Advair twice a day  to Brio once a day she got upset thinking that in switching twice a day inhaler which is superior because it is twice a day to once a day  inhaler which is potentially inferior because it once a day  Terms of smoking: She continues to smoke but acknowledges that it is important for to quit but really has no plan or does not want help rec #COPD  - stop combivent as needed. Instead take pro-air as needed  - stop advair. Instead take BREO 1 puff at night  - continue spiriva  - Please take prednisone 40 mg x1 day, then 30 mg x1 day, then 20 mg x1 day, then 10 mg x1 day, and then 5 mg x1 day and stop    #Lung nodule - Do CT scan chest mid august 2014; Weill ensure there is order  #Smoking - Really important to quit smoking for all the various  reasons as discussed  #Followup - 4 weeks ith CAT score and CT results  - see NP Tammy for followup     01/26/2013 f/u ov/[name] re abn ct chest/still smoking  Chief Complaint  Patient presents with  . Acute Visit    Pt states having slight increased SOB and hemoptysis x 5 days. She states recently dxed with PNA and has not started on tx for this yet.   at baseline says sputum yellow to green each am x years then abruptly Started feeling 8/4 weak, more sob, felt hots/ sweats/ abruptly worse 8/5 with pain R post chest and then bloody sputum on 8/9.  Variable dysphagia, sticks in throat. No obvious asp events Had dental work 2 weeks before onset  Imp ? Asp pna sup segment RLL  rec augmentin 875 twice daily x 10 days The key is to stop smoking completely before smoking completely stops you!   02/11/2013 f/u ov/[name]  Chief Complaint  Patient presents with  . Follow-up    Pt reports breathing back at her baseline. Her cough has improved with no more hemoptysis and occ white sputum.   Maint on spiriva/ advair and rare need for combivent, doe with more than slow adls rec The key is to stop smoking completely before smoking completely stops you!  Only use your albuterol (proaire) as a rescue medication to be used if you can't catch your breath by resting or doing a relaxed purse lip breathing pattern. The less you use it, the better it will work when you need it.  See Ramaswamy in 3 months with a follow cxr > did not return as rec    06/17/2013 f/u ov/[name] re: aecopd Chief Complaint  Patient presents with  . Follow-up    Increased SOB, cough and wheezing x1 week   zpak called in on 06/13/13 and mucus turned white but still very congested cough. Using advair/ spiriva as maint and now increase need for duoneb up to twice daily  rec Prednisone 10 mg take  4 each am x 2 days, 3 x 2 days,   2 each am x 2 days,  1 each am x 2 days and stop  Only use your albuterol (proaire) as a rescue  medication  For cough >  mucinex or mucinex dm 1200 mg every 12 hours   09/26/2013 f/u ov/[name] re: still smoking plus advair/ spiriva very confused with timing of maint and prns Chief Complaint  Patient presents with  . Follow-up    Breathing unchanged since the last visit. She also c/o chest tightness for the past 3 days. Cough is prod with large amounts of clear sputum. She uses rescue inhaler approx 2 times per wk on average and also using duoneb a  few times per day.   doe x > room to room walking/ not using mucinex as rec, no purulent sputum Chest tightness better p saba  >>pred taper   10/11/2013 Follow up  Returns for follow up for recent COPD flare.  Was given steroid taper last ov. Feeling much better.  Reports breathing is improved since last ov, Doing well with the Symbicort.  no new complaints. Discussed smoking cessatioin  No fever, chest pain, orthopnea, or edema  CXR last ov showed improved right suprahilar infiltrate.  rec Continue on Symbicort and Spiriva , rinse after use.  MOST IMPORTANT GOAL IS TO QUIT SMOKING.  follow up Dr. Melvyn Novas  In 3 months and As needed      01/16/2014 f/u ov/Raquelle Pietro re: GOLD III COPD / insurance requiring trial of incruse over spiriva Chief Complaint  Patient presents with  . Follow-up    Pt states that overall her breathing is doing well. Some increased chest tightness last wk which she relates to humid weather- had to use rescue inhaler a few times last wk.   rec Add incruse 2 puffs off one click each am only,  And only after the am  symbicort Work on inhaler technique:    Prevnar given today.    04/05/2014 f/u ov/[name] re: copd GOLD III still smoking  Chief Complaint  Patient presents with  . Follow-up    Breathing is about the same, has good days and bad days.  Using albuterol inhaler about once per day.      continues to struggle with understanding insurance restrictions / alternatives >>wellbutrin rx , tiral of BREO instead of  symbicort   06/30/2014 Acute OV  Complains of cough, congestion, wheezing and tightness for 1 week. Was called in [place] and pred taper on 1/11. Says she is some better but still wheezing. Did notice O2 sats dropped 80s when walking initially.  Today in ofifce O2 sats at rest >90% on RA, walking dropped to , on 2l/m kept sats >90%  She is on O2 at home  At bedtime   No chest pain, orthopnea, edema or fever. Remains on Symbicort, did not like BREO .  Still smoking . rec Prednisone taper over next week  Finish ZPack .  Chest xray today .  Begin O2 at 2l/m with activity and continue on O2 At bedtime  .  Mucinex DM Twice daily  As needed  Cough/congestion    08/11/2014 f/u ov/[name] re: GOLD III still smoking / spiriva/ symbicort  Chief Complaint  Patient presents with  . Follow-up    Pt states that her breathing is doing well.  Her cough has improved.   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 Follow-up    Breathing is unchanged. She has not needed rescue inhaler or neb.   doe x more than nl pace or up inclines  = MMRC 1 and not using 02  rec  schedule overnight oximetry on room air  to see if can stop your nocturnal 02 > never happened but remained on 2lpm     01/22/2015 acute ov/[name] re: CAP  in pt with copd GOLD III  / still smoking 02 2lpm  at hs Chief Complaint  Patient presents with  . Acute Visit    Pt c/o increased SOB and right CP x 3 days. She states she notices wheezing with moderate exertion.   Acute onset  Gradually worseng R pleuritic cp since am 01/20/15 p resuming smoking 12/29/14 / no real increase in saba hfa/ no neb assoc with congested cough with white mucus and no  rigors / no sweats or documented fever cxr ? pna RUL >  For pain or cough use norco 5 -325 one every 4 hours as needed  Work on inhaler technique:   Only use your albuterol as a rescue medication Only use the nebulizer if you try the inhalers first and it doesn't work The key is to stop smoking completely before smoking completely stops you!  levaquin 500 mg daily x 7 days     02/06/2015 f/u ov/[name] re: copd GOLD III/  02 hs only and still smoking  Chief Complaint  Patient presents with  . Follow-up    Pt states that her breathing has improved some and CP has resolved. She is using rescue inhaler 1 x per day on average.   cp gone,  Did have fever > gone, some green mucus > gone  Doe back to baseline = MMRC 1/ 02 at hs only  rec Please remember to go to the x-ray department downstairs for your tests - we will call you with the results when they are available. The key is to stop smoking completely before smoking completely stops you!    03/08/2015 f/u ov/[name] re: f/u RUL / still smoking / on symb/spiriva  And  02 hs only  Chief Complaint  Patient presents with  . Follow-up    Pt states her breathing has improved some. She has had more cough the past few days- prod with minimal green sputum.   rec CT chest > c/w resolving pna/ can't exclude ca    04/18/2015  f/u ov/[name] re: GOLD III copd / symb/spiriva/ no need for saba 02 2lpm hs only Chief Complaint  Patient presents with  . Follow-up    Pt states her breathing is unchanged. Her cough has become non prod.   doe is actually [facility] = can't walk a nl pace on a flat grade s sob (at a slow pace can walk anywhere she wants but can't do steps or inclines s sob Cough is worse in am's but no purulent or bloody sputum or cp which was prev well localized to R Chest but cleared p rx for pna rec The key is to stop smoking completely before smoking completely stops you!  Work on inhaler technique:     06/15/2015  f/u ov/[name] re: copd / poor  hfa/ f/u pna/ still actively smoking on maint rx with symb/ spiriva  Chief Complaint  Patient presents with  . Follow-up    COPD; hoarseness x 1 week; coughing up a lot of thick, dark phlegm   cough/ congestion worse in ams  No obvious day to day or daytime variabilty or assoc chest tightness,  overt sinus or hb symptoms. No unusual exp hx or h/o childhood pna/ asthma or knowledge of premature birth.  Sleeping ok without nocturnal  or early am exacerbation  of respiratory  c/o's or need for noct saba. Also denies any obvious fluctuation of symptoms with weather or environmental changes or other aggravating or alleviating factors except as  outlined above   Current Medications, Allergies, Complete Past Medical History, Past Surgical History, Family History, and Social History were reviewed in [employer] record.  ROS  The following are not active complaints unless bolded sore throat, dysphagia, dental problems, itching, sneezing,  nasal congestion or excess/ purulent secretions, ear ache,   fever, chills, sweats, unintended wt loss,   exertional cp, hemoptysis,  orthopnea pnd or leg swelling, presyncope, palpitations, heartburn, abdominal pain, anorexia, nausea, vomiting, diarrhea  or change in bowel or urinary habits, change in stools or urine, dysuria,hematuria,  rash, arthralgias, visual complaints, headache, numbness weakness or ataxia or problems with walking or coordination,  change in mood/affect or memory.             Objective:   Physical Exam   Chronically ill amb nad /moderatly  congested sounding cough  / vital signs reviewed    09/26/2013  101>102 10/11/2013 > 01/16/2014  99 > 04/06/2014  103 >105 06/30/2014 > 08/11/2014  101 > 11/09/2014  104 > 01/22/2015 106  > 02/06/2015 105 > 03/08/2015 106 > 04/18/2015  106 > 06/15/2015 103    HEENT mild turbinate edema.  Oropharynx no thrush or excess pnd or cobblestoning.  No JVD or cervical adenopathy. Mild accessory muscle  hypertrophy. Trachea midline, nl thryroid. Chest was hyperinflated by percussion with diminished breath sounds,  Junky exp rhonchi bilaterally  . Regular rate and rhythm without murmur gallop or rub or increase P2 or edema.  Abd: no hsm, nl excursion. Ext warm without cyanosis or clubbing.  MS  Nl rom/ no deformities      CXR PA and Lateral:   06/15/2015 :    I personally reviewed images and agree with radiology impression as follows:    Underlying emphysema. Interval clearing of infiltrate posterior segment right upper lobe. Residual linear opacity in this area probably represents scarring, although a small amount of residual infiltrate cannot be entirely excluded in this region. Lungs elsewhere are clear. Heart size and pulmonary vascularity are stable. No adenopathy.           Assessment & Plan:

## 2015-06-19 ENCOUNTER — Ambulatory Visit: Payer: 59 | Admitting: Internal Medicine

## 2015-06-20 NOTE — Assessment & Plan Note (Signed)
pfts 11/2011  0.86 (42%) ratio 35  -- 04/18/2015  p extensive coaching HFA effectiveness =    75% but 100% with dpi  Main limiting problem is poor hfa/ poor mc function due to active smoking with nothing else to offer at this point other than reinforcing importance of smoking cessation   I had an extended discussion with the patient reviewing all relevant studies completed to date and  lasting 10 minutes of a 15 minute visit    Each maintenance medication was reviewed in detail including most importantly the difference between maintenance and prns and under what circumstances the prns are to be triggered using an action plan format that is not reflected in the computer generated alphabetically organized AVS.    Please see instructions for details which were reviewed in writing and the patient given a copy highlighting the part that I personally wrote and discussed at today's ov.

## 2015-06-20 NOTE — Assessment & Plan Note (Signed)
See cxr 01/22/2015 >>> levaquin 500 mg/d x 7 days >>>  - CT chest  03/13/15 1. Persistent subpleural pleural and parenchymal consolidation involving the right upper lobe compatible with residual pneumonia. Followup CT chest is recommended in 3 months following antibiotic therapy and resolution of patient's symptom to ensure resolution and exclude underlying malignancy. 2. Emphysema - repeat cxr 06/15/2015 marked improvement    Strongly doubt ca based on serial review of cxr's

## 2015-06-20 NOTE — Assessment & Plan Note (Signed)
>  3 min discussion or risks > still not willing to commit to quit

## 2015-09-14 ENCOUNTER — Ambulatory Visit (INDEPENDENT_AMBULATORY_CARE_PROVIDER_SITE_OTHER): Payer: Medicare Other | Admitting: Internal Medicine

## 2015-09-14 ENCOUNTER — Ambulatory Visit (INDEPENDENT_AMBULATORY_CARE_PROVIDER_SITE_OTHER)
Admission: RE | Admit: 2015-09-14 | Discharge: 2015-09-14 | Disposition: A | Discharge status: Home / Self Care | Payer: Medicare Other | Source: Ambulatory Visit | Attending: Internal Medicine | Admitting: Internal Medicine

## 2015-09-14 ENCOUNTER — Encounter: Payer: Self-pay | Admitting: Internal Medicine

## 2015-09-14 VITALS — BP 112/72 | HR 88 | Ht 62.0 in | Wt 102.2 lb

## 2015-09-14 DIAGNOSIS — J449 Chronic obstructive pulmonary disease, unspecified: Secondary | ICD-10-CM

## 2015-09-14 DIAGNOSIS — J189 Pneumonia, unspecified organism: Secondary | ICD-10-CM

## 2015-09-14 DIAGNOSIS — Z720 Tobacco use: Secondary | ICD-10-CM

## 2015-09-14 DIAGNOSIS — F17210 Nicotine dependence, cigarettes, uncomplicated: Secondary | ICD-10-CM

## 2015-09-14 DIAGNOSIS — J9611 Chronic respiratory failure with hypoxia: Secondary | ICD-10-CM | POA: Diagnosis not present

## 2015-09-14 MED ORDER — TIOTROPIUM BROMIDE MONOHYDRATE 2.5 MCG/ACT IN AERS: INHALATION_SPRAY | RESPIRATORY_TRACT | Status: DC

## 2015-09-14 NOTE — Patient Instructions (Signed)
Try spiriva respimat 2 pffs each am in place of the powder  The key is to stop smoking completely before smoking completely stops you!   Please schedule a follow up visit in 3 months but call sooner if needed

## 2015-09-14 NOTE — Progress Notes (Signed)
Subjective:    Patient ID: [name], female    DOB: 08/15/1945   MRN: [id number]  HPI #Smoker Smoker - STarted age 70. 16 pack per day. Quit end 2012 for first time with help of wellbutrin and patch.  But recently 65 year old granddaughter from congential liver disease ("cirrhosis") moved back with her and since then smoking. Patient is primary health care provider because her daughter and 37 year old do not get along.  Smokes 1 ppd (used to smoke 2 ppd)  #Chronic cough  - Stopped ACE inhibitor fall 2013  #Lung nodule - 73m RLL March 2014  -  CT scan chest 08/19/2012: 846mnoncalcified right lower lobe nodule with irregular margins.  - PET scan 09/20/12 - indeterminate uptake   #COPD Gold stage III PFT 12/10/2011  - Gold stage 3 copd - fev1 0.8L/42%, 20% BD respinse. TLC 133%, DLCO 46% - Walk test in 2013   - pulse ox 88% at first lap 185 feet but increased to 91% at 3rd lap end  #AECOPD  - OV 05/12/2012 - treated with Levaquin and prednisone - March 2014: Failed aecopd office  treatment on 08/17/2012 and hospitalized between 08/19/2012 in 08/21/2012; discharged on 10 day prednisone taper    OV 08/27/2012  COPD: Failed aecopd office  treatment on 08/17/2012 and hospitalized between 08/19/2012 in 08/21/2012; discharged on 10 day prednisone taper. Currently feeling like she is slowly limping back to normal. FEels she needs another week off; STil lwith fatigue  New RLL nodule 53m37mdiagnosed while in hospital for aecopd.  She does not believe this is potentially lung cancer. She believes she was told `nothing to worry about` but she understands need for followup. She is not interested in intervetnion curently    REC  COPD COPD - Glad you slowly getting better after recent flareup - Continue the medications and advice of the hospitalist who treated you - Continue your regular medications for COPD - Take another week off and slowly work on gaining her strength back - When  financially possible I would recommend you take oxygen at night - When time allows I would recommend you to pulmonary rehabilitation - Start N. acetylcysteine 600 mg twice a day    #Lung nodule - Do PET scan into 10 weeks  #Smoking - Really important to quit smoking for all the various reasons as discussed  #Followup - 10 weeks with pet scan results   OV 01/06/2013 Followup for the above.  -In terms of lung nodule: We will make sure there is a followup scan of the chest in mid August 2014  - In terms of COPD: Details of symptoms are listed in the table below. COPD cat score is 23 and is around baseline but she still has significant symptoms. I noticed that she is on multiple different inhalers and has poor understanding of these. She is actually taking Combivent, Advair and Spiriva. I tried explained the differences between Spiriva and Combivent and why she should not overlap these inhalers but she got upset that is to restrict her inhaler use her logic is that when she takes Combivent it helps immediately and that Spiriva is only once a day and she does not seem to get a huge benefit from it. When I suggested that she switch Advair twice a day  to Brio once a day she got upset thinking that in switching twice a day inhaler which is superior because it is twice a day to once a day  inhaler which is potentially inferior because it once a day  Terms of smoking: She continues to smoke but acknowledges that it is important for to quit but really has no plan or does not want help rec #COPD  - stop combivent as needed. Instead take pro-air as needed  - stop advair. Instead take BREO 1 puff at night  - continue spiriva  - Please take prednisone 40 mg x1 day, then 30 mg x1 day, then 20 mg x1 day, then 10 mg x1 day, and then 5 mg x1 day and stop    #Lung nodule - Do CT scan chest mid august 2014; Weill ensure there is order  #Smoking - Really important to quit smoking for all the various  reasons as discussed  #Followup - 4 weeks ith CAT score and CT results  - see NP Tammy for followup     01/26/2013 f/u ov/[name] re abn ct chest/still smoking  Chief Complaint  Patient presents with  . Acute Visit    Pt states having slight increased SOB and hemoptysis x 5 days. She states recently dxed with PNA and has not started on tx for this yet.   at baseline says sputum yellow to green each am x years then abruptly Started feeling 8/4 weak, more sob, felt hots/ sweats/ abruptly worse 8/5 with pain R post chest and then bloody sputum on 8/9.  Variable dysphagia, sticks in throat. No obvious asp events Had dental work 2 weeks before onset  Imp ? Asp pna sup segment RLL  rec augmentin 875 twice daily x 10 days The key is to stop smoking completely before smoking completely stops you!   02/11/2013 f/u ov/[name]  Chief Complaint  Patient presents with  . Follow-up    Pt reports breathing back at her baseline. Her cough has improved with no more hemoptysis and occ white sputum.   Maint on spiriva/ advair and rare need for combivent, doe with more than slow adls rec The key is to stop smoking completely before smoking completely stops you!  Only use your albuterol (proaire) as a rescue medication to be used if you can't catch your breath by resting or doing a relaxed purse lip breathing pattern. The less you use it, the better it will work when you need it.  See Ramaswamy in 3 months with a follow cxr > did not return as rec    06/17/2013 f/u ov/[name] re: aecopd Chief Complaint  Patient presents with  . Follow-up    Increased SOB, cough and wheezing x1 week   zpak called in on 06/13/13 and mucus turned white but still very congested cough. Using advair/ spiriva as maint and now increase need for duoneb up to twice daily  rec Prednisone 10 mg take  4 each am x 2 days, 3 x 2 days,   2 each am x 2 days,  1 each am x 2 days and stop  Only use your albuterol (proaire) as a rescue  medication  For cough >  mucinex or mucinex dm 1200 mg every 12 hours   09/26/2013 f/u ov/[name] re: still smoking plus advair/ spiriva very confused with timing of maint and prns Chief Complaint  Patient presents with  . Follow-up    Breathing unchanged since the last visit. She also c/o chest tightness for the past 3 days. Cough is prod with large amounts of clear sputum. She uses rescue inhaler approx 2 times per wk on average and also using duoneb a  few times per day.   doe x > room to room walking/ not using mucinex as rec, no purulent sputum Chest tightness better p saba  >>pred taper   10/11/2013 Follow up  Returns for follow up for recent COPD flare.  Was given steroid taper last ov. Feeling much better.  Reports breathing is improved since last ov, Doing well with the Symbicort.  no new complaints. Discussed smoking cessatioin  No fever, chest pain, orthopnea, or edema  CXR last ov showed improved right suprahilar infiltrate.  rec Continue on Symbicort and Spiriva , rinse after use.  MOST IMPORTANT GOAL IS TO QUIT SMOKING.  follow up Dr. Melvyn Novas  In 3 months and As needed      01/16/2014 f/u ov/[name] re: GOLD III COPD / insurance requiring trial of incruse over spiriva Chief Complaint  Patient presents with  . Follow-up    Pt states that overall her breathing is doing well. Some increased chest tightness last wk which she relates to humid weather- had to use rescue inhaler a few times last wk.   rec Add incruse 2 puffs off one click each am only,  And only after the am  symbicort Work on inhaler technique:    Prevnar given today.    04/05/2014 f/u ov/[name] re: copd GOLD III still smoking  Chief Complaint  Patient presents with  . Follow-up    Breathing is about the same, has good days and bad days.  Using albuterol inhaler about once per day.      continues to struggle with understanding insurance restrictions / alternatives >>wellbutrin rx , tiral of BREO instead of  symbicort   06/30/2014 Acute OV  Complains of cough, congestion, wheezing and tightness for 1 week. Was called in [place] and pred taper on 1/11. Says she is some better but still wheezing. Did notice O2 sats dropped 80s when walking initially.  Today in ofifce O2 sats at rest >90% on RA, walking dropped to , on 2l/m kept sats >90%  She is on O2 at home  At bedtime   No chest pain, orthopnea, edema or fever. Remains on Symbicort, did not like BREO .  Still smoking . rec Prednisone taper over next week  Finish ZPack .  Chest xray today .  Begin O2 at 2l/m with activity and continue on O2 At bedtime  .  Mucinex DM Twice daily  As needed  Cough/congestion    08/11/2014 f/u ov/[name] re: GOLD III still smoking / spiriva/ symbicort  Chief Complaint  Patient presents with  . Follow-up    Pt states that her breathing is doing well.  Her cough has improved.   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 Follow-up    Breathing is unchanged. She has not needed rescue inhaler or neb.   doe x more than nl pace or up inclines  = MMRC 1 and not using 02  rec  schedule overnight oximetry on room air  to see if can stop your nocturnal 02 > never happened but remained on 2lpm     01/22/2015 acute ov/[name] re: CAP  in pt with copd GOLD III  / still smoking 02 2lpm  at hs Chief Complaint  Patient presents with  . Acute Visit    Pt c/o increased SOB and right CP x 3 days. She states she notices wheezing with moderate exertion.   Acute onset  Gradually worseng R pleuritic cp since am 01/20/15 p resuming smoking 12/29/14 / no real increase in saba hfa/ no neb assoc with congested cough with white mucus and no  rigors / no sweats or documented fever cxr ? pna RUL >  For pain or cough use norco 5 -325 one every 4 hours as needed  Work on inhaler technique:   Only use your albuterol as a rescue medication Only use the nebulizer if you try the inhalers first and it doesn't work The key is to stop smoking completely before smoking completely stops you!  levaquin 500 mg daily x 7 days     02/06/2015 f/u ov/[name] re: copd GOLD III/  02 hs only and still smoking  Chief Complaint  Patient presents with  . Follow-up    Pt states that her breathing has improved some and CP has resolved. She is using rescue inhaler 1 x per day on average.   cp gone,  Did have fever > gone, some green mucus > gone  Doe back to baseline = MMRC 1/ 02 at hs only  rec Please remember to go to the x-ray department downstairs for your tests - we will call you with the results when they are available. The key is to stop smoking completely before smoking completely stops you!    03/08/2015 f/u ov/[name] re: f/u RUL / still smoking / on symb/spiriva  And  02 hs only  Chief Complaint  Patient presents with  . Follow-up    Pt states her breathing has improved some. She has had more cough the past few days- prod with minimal green sputum.   rec CT chest > c/w resolving pna/ can't exclude ca   09/14/2015  f/u ov/[name] re: copd / still smoking spiriva/symbicort  Chief Complaint  Patient presents with  . Follow-up    No change in breathing since last OV. Pt notes some hemoptysis at times.    back at work despite chronic doe = MMRC2 = can't walk a nl pace on a flat grade s sob Hemoptysis = min blood streaking x sev episodes x sev months, most of am mucus is clear   needs for hfa saba maybe once a day/ very rarely needs neb     No obvious day to day or daytime variabilty or assoc excess/ purulent sputum or mucus plugs   chest tightness,  overt sinus or hb symptoms. No unusual exp hx or h/o childhood pna/ asthma or knowledge of  premature birth.  Sleeping ok without nocturnal  or early am exacerbation  of respiratory  c/o's or need for noct saba. Also denies any obvious fluctuation of symptoms with weather or environmental changes or other aggravating or alleviating factors except as outlined above   Current Medications, Allergies, Complete Past Medical History, Past Surgical History, Family History, and Social History were reviewed in [employer] record.  ROS  The following are not active complaints unless bolded sore throat, dysphagia, dental problems, itching, sneezing,  nasal congestion or excess/ purulent secretions, ear ache,   fever, chills, sweats, unintended wt loss,   exertional cp, hemoptysis,  orthopnea pnd or leg swelling, presyncope, palpitations, heartburn, abdominal pain, anorexia,  nausea, vomiting, diarrhea  or change in bowel or urinary habits, change in stools or urine, dysuria,hematuria,  rash, arthralgias, visual complaints, headache, numbness weakness or ataxia or problems with walking or coordination,  change in mood/affect or memory.             Objective:   Physical Exam   Chronically ill amb nad /minimally congested sounding cough  / vital signs reviewed    09/26/2013  101>102 10/11/2013 > 01/16/2014  99 > 04/06/2014  103 >105 06/30/2014 > 08/11/2014  101 > 11/09/2014  104 > 01/22/2015 106  > 02/06/2015 105 > 03/08/2015 106 > 04/18/2015  106 > 06/15/2015 103 > 09/14/2015   102    HEENT mild turbinate edema.  Oropharynx no thrush or excess pnd or cobblestoning.  No JVD or cervical adenopathy. Mild accessory muscle hypertrophy. Trachea midline, nl thryroid. Chest was hyperinflated by percussion with diminished breath sounds,  Min exp rhonchi bilaterally  . Regular rate and rhythm without murmur gallop or rub or increase P2 or edema.  Abd: no hsm, nl excursion. Ext warm without cyanosis or clubbing.  MS  Nl rom/ no deformities       CXR PA and Lateral:   09/14/2015 :    I  personally reviewed images and agree with radiology impression as follows:    Stable linear scarring over the right upper lobe.  Moderate emphysematous disease. Pleural parenchymal scarring versus small amount of bilateral pleural fluid posteriorly.          Assessment & Plan:

## 2015-09-15 NOTE — Assessment & Plan Note (Signed)
pfts 11/2011  0.86 (42%) ratio 35  -- 09/14/2015  p extensive coaching HFA effectiveness =    90% so change completely to hfa /respimat  Would like to see her use one technique for all inhalers but otherwise no change in rx needed  I had an extended discussion with the patient reviewing all relevant studies completed to date and  lasting 15 to 20 minutes of a 25 minute visit    Each maintenance medication was reviewed in detail including most importantly the difference between maintenance and prns and under what circumstances the prns are to be triggered using an action plan format that is not reflected in the computer generated alphabetically organized AVS.    Please see instructions for details which were reviewed in writing and the patient given a copy highlighting the part that I personally wrote and discussed at today's ov.

## 2015-09-15 NOTE — Assessment & Plan Note (Signed)
Exertional desats 06/30/2014 >O2 2lm rx with act  - 11/09/2014  Walked RA  2 laps @ 185 ft each stopped due to  Sat down to 88 % at nl pace but no sob  - ono RA ordered  11/13/2014 > not  done   rx as of 09/14/2015 use 02 with exercise as needed  but not adls but always 2lpm hs

## 2015-09-15 NOTE — Assessment & Plan Note (Signed)
See cxr 01/22/2015 >>> levaquin 500 mg/d x 7 days >>>  - CT chest  03/13/15 1. Persistent subpleural pleural and parenchymal consolidation involving the right upper lobe compatible with residual pneumonia. Followup CT chest is recommended in 3 months following antibiotic therapy and resolution of patient's symptom to ensure resolution and exclude underlying malignancy. 2. Emphysema - repeat cxr 06/15/2015 marked improvement / resolved 09/14/2015   No further f/u planned

## 2015-09-15 NOTE — Assessment & Plan Note (Signed)
>   3 m I took an extended  opportunity with this patient to outline the consequences of continued cigarette use  in airway disorders based on all the data we have from the multiple national lung health studies (perfomed over decades at millions of dollars in cost)  indicating that smoking cessation, not choice of inhalers or physicians, is the most important aspect of care.    Not willing to commit to quit at present

## 2015-09-17 ENCOUNTER — Telehealth: Payer: Self-pay | Admitting: *Deleted

## 2015-09-17 NOTE — Progress Notes (Signed)
Quick Note:  LMTCB ______ 

## 2015-09-17 NOTE — Telephone Encounter (Signed)
LMTCB

## 2015-09-17 NOTE — Telephone Encounter (Signed)
-----  Message from Tanda Rockers, MD sent at 09/15/2015  6:02 AM EDT ----- Verify still using 02 at hs

## 2015-09-18 NOTE — Telephone Encounter (Signed)
Notes Recorded by Tanda Rockers, MD on 09/15/2015 at 5:40 AM Call pt: Reviewed cxr and no acute change so no change in recommendations made at ov  Pt returned call. She states that she is currently using 2L of oxygen at bedtime only. I also reviewed the pt's cxr results. She voiced understanding and had no further questions.   Will forward message to MW as Juluis Rainier

## 2015-09-18 NOTE — Telephone Encounter (Signed)
[place]

## 2015-09-18 NOTE — Telephone Encounter (Signed)
Msg already left for pt tcb today

## 2015-09-18 NOTE — Progress Notes (Signed)
Quick Note:  Pt returned call. Reviewed results and recs. Pt voiced understanding and had no further questions. ______

## 2015-09-19 ENCOUNTER — Telehealth: Payer: Self-pay | Admitting: Internal Medicine

## 2015-09-19 DIAGNOSIS — J9611 Chronic respiratory failure with hypoxia: Secondary | ICD-10-CM

## 2015-09-19 NOTE — Telephone Encounter (Signed)
Called and spoke with Manuela Schwartz at [facility]. I explained to her that the order for an ONO on RA will be placed. She voiced understanding and had no further questions. Order placed. Nothing further needed.

## 2015-09-19 NOTE — Telephone Encounter (Signed)
Fine with me

## 2015-09-19 NOTE — Telephone Encounter (Signed)
Spoke with [name] at [facility]  She states that she is needing qualifying o2 sats for portable o2  I advised that she only uses o2 with sleep  In this case she asks for ONO on RA to be ordered and also order to have portable o2 picked up  Please advise if okay to order, thanks!

## 2015-09-21 ENCOUNTER — Telehealth: Payer: Self-pay | Admitting: Internal Medicine

## 2015-09-21 MED ORDER — PREDNISONE 10 MG PO TABS: ORAL_TABLET | ORAL | Status: DC

## 2015-09-21 MED ORDER — AMOXICILLIN-POT CLAVULANATE 875-125 MG PO TABS: 1.0000 | ORAL_TABLET | Freq: Two times a day (BID) | ORAL | Status: DC

## 2015-09-21 NOTE — Telephone Encounter (Signed)
Ok to use duoneb up to every 4 h prn  Augmentin 875 mg take one pill twice daily  X 10 days - take at breakfast and supper with large glass of water.  It would help reduce the usual side effects (diarrhea and yeast infections) if you ate cultured yogurt at lunch.   Prednisone 10 mg take  4 each am x 2 days,   2 each am x 2 days,  1 each am x 2 days and stop

## 2015-09-21 NOTE — Telephone Encounter (Signed)
Spoke with pt, aware of recs.  rx sent to preferred pharmacy.  Nothing further needed.  

## 2015-09-21 NOTE — Telephone Encounter (Signed)
Called and spoke to pt. Pt c/o increase in SOB, prod cough with green mucus, wheezing, and constant chest tightness x 2 days. Pt states she is using her albuterol hfa twice daily for her SOB. Pt denies f/c/s. Pt is requesting recs.   Dr. Melvyn Novas please advise. Thanks.     Patient Instructions       Try spiriva respimat 2 pffs each am in place of the powder  The key is to stop smoking completely before smoking completely stops you!   Please schedule a follow up visit in 3 months but call sooner if needed

## 2015-09-24 ENCOUNTER — Telehealth: Payer: Self-pay | Admitting: Internal Medicine

## 2015-09-24 NOTE — Telephone Encounter (Signed)
ONO on RA done by [facility] on 4/8/7 shows pt does still qualify for o2 with sleep  LMTCB

## 2015-09-25 NOTE — Telephone Encounter (Signed)
Results have been explained to patient, pt expressed understanding. Nothing further needed.  

## 2015-09-25 NOTE — Telephone Encounter (Signed)
Patient returned call, CB [phone]

## 2015-10-01 ENCOUNTER — Telehealth: Payer: Self-pay | Admitting: Internal Medicine

## 2015-10-01 NOTE — Telephone Encounter (Signed)
Attempted to call [name]. Line was busy.

## 2015-10-02 ENCOUNTER — Telehealth: Payer: Self-pay | Admitting: Internal Medicine

## 2015-10-02 NOTE — Telephone Encounter (Signed)
Verified with Jeani Hawking note was received.  Nothing further needed.  Will sign off.

## 2015-10-02 NOTE — Telephone Encounter (Signed)
LMTCB

## 2015-10-02 NOTE — Telephone Encounter (Signed)
i have faxed ov note to [facility] nothing further needed

## 2015-10-02 NOTE — Telephone Encounter (Signed)
Spoke with [name] at [facility], states she does not see a note in their system from [facility] regarding this call, but will further investigate this and call us back. Will await call.

## 2015-10-02 NOTE — Telephone Encounter (Signed)
Pt returning cal and can be reached @ [phone].[name]

## 2015-10-02 NOTE — Telephone Encounter (Signed)
Called and spoke with Maudie Mercury at [facility]. She states that the patient will need a new ov with a walk test. She states that due to her insurance switching she will need to be requalified for her oxygen. I explained to Maudie Mercury that I would contact the patient and get her ov scheduled. She voiced understanding and had no further questions.  LVM for pt to return call.

## 2015-10-03 NOTE — Telephone Encounter (Signed)
lmomtcb for pt 

## 2015-10-03 NOTE — Telephone Encounter (Signed)
lmtcb

## 2015-10-03 NOTE — Telephone Encounter (Signed)
Spoke with pt, scheduled an ov for Monday- pt aware there will be a qual walk test at this visit.  Nothing further needed.

## 2015-10-03 NOTE — Telephone Encounter (Signed)
Patient is returning call, she is leaving work now, please call after 3:45 at [phone].

## 2015-10-03 NOTE — Telephone Encounter (Signed)
Pt returning call again.[name]

## 2015-10-08 ENCOUNTER — Ambulatory Visit (INDEPENDENT_AMBULATORY_CARE_PROVIDER_SITE_OTHER): Payer: Medicare Other | Admitting: Internal Medicine

## 2015-10-08 ENCOUNTER — Encounter: Payer: Self-pay | Admitting: Internal Medicine

## 2015-10-08 VITALS — BP 130/80 | HR 97 | Ht 62.0 in | Wt 103.2 lb

## 2015-10-08 DIAGNOSIS — J449 Chronic obstructive pulmonary disease, unspecified: Secondary | ICD-10-CM | POA: Diagnosis not present

## 2015-10-08 DIAGNOSIS — J9611 Chronic respiratory failure with hypoxia: Secondary | ICD-10-CM

## 2015-10-08 MED ORDER — ALBUTEROL SULFATE (2.5 MG/3ML) 0.083% IN NEBU: 2.5000 mg | INHALATION_SOLUTION | Freq: Four times a day (QID) | RESPIRATORY_TRACT | Status: DC | PRN

## 2015-10-08 NOTE — Progress Notes (Signed)
Subjective:    Patient ID: [name], female    DOB: 08/15/1945   MRN: [id number]  HPI #Smoker Smoker - STarted age 70. 16 pack per day. Quit end 2012 for first time with help of wellbutrin and patch.  But recently 65 year old granddaughter from congential liver disease ("cirrhosis") moved back with her and since then smoking. Patient is primary health care provider because her daughter and 37 year old do not get along.  Smokes 1 ppd (used to smoke 2 ppd)  #Chronic cough  - Stopped ACE inhibitor fall 2013  #Lung nodule - 73m RLL March 2014  -  CT scan chest 08/19/2012: 846mnoncalcified right lower lobe nodule with irregular margins.  - PET scan 09/20/12 - indeterminate uptake   #COPD Gold stage III PFT 12/10/2011  - Gold stage 3 copd - fev1 0.8L/42%, 20% BD respinse. TLC 133%, DLCO 46% - Walk test in 2013   - pulse ox 88% at first lap 185 feet but increased to 91% at 3rd lap end  #AECOPD  - OV 05/12/2012 - treated with Levaquin and prednisone - March 2014: Failed aecopd office  treatment on 08/17/2012 and hospitalized between 08/19/2012 in 08/21/2012; discharged on 10 day prednisone taper    OV 08/27/2012  COPD: Failed aecopd office  treatment on 08/17/2012 and hospitalized between 08/19/2012 in 08/21/2012; discharged on 10 day prednisone taper. Currently feeling like she is slowly limping back to normal. FEels she needs another week off; STil lwith fatigue  New RLL nodule 53m37mdiagnosed while in hospital for aecopd.  She does not believe this is potentially lung cancer. She believes she was told `nothing to worry about` but she understands need for followup. She is not interested in intervetnion curently    REC  COPD COPD - Glad you slowly getting better after recent flareup - Continue the medications and advice of the hospitalist who treated you - Continue your regular medications for COPD - Take another week off and slowly work on gaining her strength back - When  financially possible I would recommend you take oxygen at night - When time allows I would recommend you to pulmonary rehabilitation - Start N. acetylcysteine 600 mg twice a day    #Lung nodule - Do PET scan into 10 weeks  #Smoking - Really important to quit smoking for all the various reasons as discussed  #Followup - 10 weeks with pet scan results   OV 01/06/2013 Followup for the above.  -In terms of lung nodule: We will make sure there is a followup scan of the chest in mid August 2014  - In terms of COPD: Details of symptoms are listed in the table below. COPD cat score is 23 and is around baseline but she still has significant symptoms. I noticed that she is on multiple different inhalers and has poor understanding of these. She is actually taking Combivent, Advair and Spiriva. I tried explained the differences between Spiriva and Combivent and why she should not overlap these inhalers but she got upset that is to restrict her inhaler use her logic is that when she takes Combivent it helps immediately and that Spiriva is only once a day and she does not seem to get a huge benefit from it. When I suggested that she switch Advair twice a day  to Brio once a day she got upset thinking that in switching twice a day inhaler which is superior because it is twice a day to once a day  inhaler which is potentially inferior because it once a day  Terms of smoking: She continues to smoke but acknowledges that it is important for to quit but really has no plan or does not want help rec #COPD  - stop combivent as needed. Instead take pro-air as needed  - stop advair. Instead take BREO 1 puff at night  - continue spiriva  - Please take prednisone 40 mg x1 day, then 30 mg x1 day, then 20 mg x1 day, then 10 mg x1 day, and then 5 mg x1 day and stop    #Lung nodule - Do CT scan chest mid august 2014; Weill ensure there is order  #Smoking - Really important to quit smoking for all the various  reasons as discussed  #Followup - 4 weeks ith CAT score and CT results  - see NP Tammy for followup     01/26/2013 f/u ov/Waino Mounsey re abn ct chest/still smoking  Chief Complaint  Patient presents with  . Acute Visit    Pt states having slight increased SOB and hemoptysis x 5 days. She states recently dxed with PNA and has not started on tx for this yet.   at baseline says sputum yellow to green each am x years then abruptly Started feeling 8/4 weak, more sob, felt hots/ sweats/ abruptly worse 8/5 with pain R post chest and then bloody sputum on 8/9.  Variable dysphagia, sticks in throat. No obvious asp events Had dental work 2 weeks before onset  Imp ? Asp pna sup segment RLL  rec augmentin 875 twice daily x 10 days The key is to stop smoking completely before smoking completely stops you!   02/11/2013 f/u ov/Laini Urick  Chief Complaint  Patient presents with  . Follow-up    Pt reports breathing back at her baseline. Her cough has improved with no more hemoptysis and occ white sputum.   Maint on spiriva/ advair and rare need for combivent, doe with more than slow adls rec The key is to stop smoking completely before smoking completely stops you!  Only use your albuterol (proaire) as a rescue medication to be used if you can't catch your breath by resting or doing a relaxed purse lip breathing pattern. The less you use it, the better it will work when you need it.  See Ramaswamy in 3 months with a follow cxr > did not return as rec    06/17/2013 f/u ov/Dejae Bernet re: aecopd Chief Complaint  Patient presents with  . Follow-up    Increased SOB, cough and wheezing x1 week   zpak called in on 06/13/13 and mucus turned white but still very congested cough. Using advair/ spiriva as maint and now increase need for duoneb up to twice daily  rec Prednisone 10 mg take  4 each am x 2 days, 3 x 2 days,   2 each am x 2 days,  1 each am x 2 days and stop  Only use your albuterol (proaire) as a rescue  medication  For cough >  mucinex or mucinex dm 1200 mg every 12 hours   09/26/2013 f/u ov/Anedra Penafiel re: still smoking plus advair/ spiriva very confused with timing of maint and prns Chief Complaint  Patient presents with  . Follow-up    Breathing unchanged since the last visit. She also c/o chest tightness for the past 3 days. Cough is prod with large amounts of clear sputum. She uses rescue inhaler approx 2 times per wk on average and also using duoneb a  few times per day.   doe x > room to room walking/ not using mucinex as rec, no purulent sputum Chest tightness better p saba  >>pred taper   10/11/2013 Follow up  Returns for follow up for recent COPD flare.  Was given steroid taper last ov. Feeling much better.  Reports breathing is improved since last ov, Doing well with the Symbicort.  no new complaints. Discussed smoking cessatioin  No fever, chest pain, orthopnea, or edema  CXR last ov showed improved right suprahilar infiltrate.  rec Continue on Symbicort and Spiriva , rinse after use.  MOST IMPORTANT GOAL IS TO QUIT SMOKING.  follow up Dr. Melvyn Novas  In 3 months and As needed      01/16/2014 f/u ov/Jahleah Mariscal re: GOLD III COPD / insurance requiring trial of incruse over spiriva Chief Complaint  Patient presents with  . Follow-up    Pt states that overall her breathing is doing well. Some increased chest tightness last wk which she relates to humid weather- had to use rescue inhaler a few times last wk.   rec Add incruse 2 puffs off one click each am only,  And only after the am  symbicort Work on inhaler technique:    Prevnar given today.    04/05/2014 f/u ov/Qunisha Bryk re: copd GOLD III still smoking  Chief Complaint  Patient presents with  . Follow-up    Breathing is about the same, has good days and bad days.  Using albuterol inhaler about once per day.      continues to struggle with understanding insurance restrictions / alternatives >>wellbutrin rx , tiral of BREO instead of  symbicort   06/30/2014 Acute OV  Complains of cough, congestion, wheezing and tightness for 1 week. Was called in [place] and pred taper on 1/11. Says she is some better but still wheezing. Did notice O2 sats dropped 80s when walking initially.  Today in ofifce O2 sats at rest >90% on RA, walking dropped to , on 2l/m kept sats >90%  She is on O2 at home  At bedtime   No chest pain, orthopnea, edema or fever. Remains on Symbicort, did not like BREO .  Still smoking . rec Prednisone taper over next week  Finish ZPack .  Chest xray today .  Begin O2 at 2l/m with activity and continue on O2 At bedtime  .  Mucinex DM Twice daily  As needed  Cough/congestion    08/11/2014 f/u ov/Annabel Gibeau re: GOLD III still smoking / spiriva/ symbicort  Chief Complaint  Patient presents with  . Follow-up    Pt states that her breathing is doing well.  Her cough has improved.   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 Follow-up    Breathing is unchanged. She has not needed rescue inhaler or neb.   doe x more than nl pace or up inclines  = MMRC 1 and not using 02  rec  schedule overnight oximetry on room air  to see if can stop your nocturnal 02 > never happened but remained on 2lpm     01/22/2015 acute ov/Dason Mosley re: CAP  in pt with copd GOLD III  / still smoking 02 2lpm  at hs Chief Complaint  Patient presents with  . Acute Visit    Pt c/o increased SOB and right CP x 3 days. She states she notices wheezing with moderate exertion.   Acute onset  Gradually worseng R pleuritic cp since am 01/20/15 p resuming smoking 12/29/14 / no real increase in saba hfa/ no neb assoc with congested cough with white mucus and no  rigors / no sweats or documented fever cxr ? pna RUL >  For pain or cough use norco 5 -325 one every 4 hours as needed  Work on inhaler technique:   Only use your albuterol as a rescue medication Only use the nebulizer if you try the inhalers first and it doesn't work The key is to stop smoking completely before smoking completely stops you!  levaquin 500 mg daily x 7 days     02/06/2015 f/u ov/Meeya Goldin re: copd GOLD III/  02 hs only and still smoking  Chief Complaint  Patient presents with  . Follow-up    Pt states that her breathing has improved some and CP has resolved. She is using rescue inhaler 1 x per day on average.   cp gone,  Did have fever > gone, some green mucus > gone  Doe back to baseline = MMRC 1/ 02 at hs only  rec Please remember to go to the x-ray department downstairs for your tests - we will call you with the results when they are available. The key is to stop smoking completely before smoking completely stops you!    03/08/2015 f/u ov/Mariette Cowley re: f/u RUL / still smoking / on symb/spiriva  And  02 hs only  Chief Complaint  Patient presents with  . Follow-up    Pt states her breathing has improved some. She has had more cough the past few days- prod with minimal green sputum.   rec CT chest > c/w resolving pna/ can't exclude ca   09/14/2015  f/u ov/Indiya Izquierdo re: copd / still smoking spiriva/symbicort with GOLD III criteria copd  Chief Complaint  Patient presents with  . Follow-up    No change in breathing since last OV. Pt notes some hemoptysis at times.    back at work despite chronic doe = MMRC2 = can't walk a nl pace on a flat grade s sob Hemoptysis = min blood streaking x sev episodes x sev months, most of am mucus is clear needs for hfa saba maybe once a day/ very rarely needs neb rec Try spiriva respimat 2 pffs each am in place of the powder The key is to stop smoking completely before smoking completely stops you!       10/08/2015  f/u ov/Aviraj Kentner re: 02  recert/still smoking/ GOLD  Chief Complaint  Patient presents with  . Follow-up    Pt here per request of [facility] to recert for amb o2. She has POC at home, but has really only been using o2 with sleep.   very confused with 02 instructions/ tanks vs concentrator Doe = MMRC2 = can't walk a nl pace on a flat grade s sob but does fine slow and flat like shopping leaning on cart fine, never tried walking on portable 02        No obvious day to day or daytime variabilty or assoc excess/ purulent sputum or mucus plugs   chest tightness,  overt sinus or hb symptoms. No unusual exp hx or h/o  childhood pna/ asthma or knowledge of premature birth.  Sleeping ok without nocturnal  or early am exacerbation  of respiratory  c/o's or need for noct saba. Also denies any obvious fluctuation of symptoms with weather or environmental changes or other aggravating or alleviating factors except as outlined above   Current Medications, Allergies, Complete Past Medical History, Past Surgical History, Family History, and Social History were reviewed in [employer] record.  ROS  The following are not active complaints unless bolded sore throat, dysphagia, dental problems, itching, sneezing,  nasal congestion or excess/ purulent secretions, ear ache,   fever, chills, sweats, unintended wt loss,   exertional cp, hemoptysis,  orthopnea pnd or leg swelling, presyncope, palpitations, heartburn, abdominal pain, anorexia, nausea, vomiting, diarrhea  or change in bowel or urinary habits, change in stools or urine, dysuria,hematuria,  rash, arthralgias, visual complaints, headache, numbness weakness or ataxia or problems with walking or coordination,  change in mood/affect or memory.             Objective:   Physical Exam   Chronically ill amb nad /minimally congested rattly  sounding cough  / vital signs reviewed    09/26/2013  101>102 10/11/2013 > 01/16/2014  99 > 04/06/2014  103 >105 06/30/2014 >  08/11/2014  101 > 11/09/2014  104 > 01/22/2015 106  > 02/06/2015 105 > 03/08/2015 106 > 04/18/2015  106 > 06/15/2015 103 > 09/14/2015   102 > 10/08/2015   103    HEENT mild turbinate edema.  Oropharynx no thrush or excess pnd or cobblestoning.  No JVD or cervical adenopathy. Mild accessory muscle hypertrophy. Trachea midline, nl thryroid. Chest was hyperinflated by percussion with diminished breath sounds,  Late distant exp rhonchi bilaterally  . Regular rate and rhythm without murmur gallop or rub or increase P2 or edema.  Abd: no hsm, nl excursion. Ext warm without cyanosis or clubbing.  MS  Nl rom/ no deformities       CXR PA and Lateral:   09/14/2015 :    I personally reviewed images and agree with radiology impression as follows:    Stable linear scarring over the right upper lobe. Moderate emphysematous disease. Pleural parenchymal scarring versus small amount of bilateral pleural fluid posteriorly.      Assessment & Plan:

## 2015-10-08 NOTE — Patient Instructions (Addendum)
You are qualified for portable 02 if you wish but no need to wear it at rest or walking around the house - continue it at bedtime thru the concentrator at 2lpm   Ok to finish up the powder spiriva and just refill the spray.  The most important aspect of your care is whether or not you smoke - your cough and congestion should improve within a few weeks  If you have questions about your oxygen equipment, bring in the portable system and show [place] what you have so we can help sort out what you need  Change the next appointment to 3 months from now

## 2015-10-09 ENCOUNTER — Encounter: Payer: Self-pay | Admitting: Internal Medicine

## 2015-10-09 NOTE — Assessment & Plan Note (Addendum)
Exertional desats 06/30/2014 >O2 2lm rx with act  - 11/09/2014  Walked RA  2 laps @ 185 ft each stopped due to  Sat down to 88 % at nl pace but no sob  - ono RA  09/22/15  desat < 89% x 1:37mn > rec continue 02 2lpm - 47/35/3299certification: Patient Saturations on Room Air at Rest = 94% and while Ambulating 2 laps x 185 each= 87% Patient Saturations on 2Liters of oxygen while Ambulating =96% x one additional lap   rx as of 10/08/2015 use 02 with exercise as needed  but not adls but always 2lpm hs   Warned again about 02 use and cig use risk fires

## 2015-10-09 NOTE — Assessment & Plan Note (Signed)
pfts 11/2011  0.86 (42%) ratio 35  -- 09/14/2015  p extensive coaching HFA effectiveness =    90% so changed completely to hfa /respimat  Doing ok on respimat, prefer similar laba/ics and rescue so d/c all dpi spiriva p uses up and just use the respimat

## 2015-10-12 ENCOUNTER — Encounter: Payer: Self-pay | Admitting: Internal Medicine

## 2015-10-23 ENCOUNTER — Telehealth: Payer: Self-pay | Admitting: Internal Medicine

## 2015-10-23 NOTE — Telephone Encounter (Signed)
LM for [facility] x1

## 2015-10-24 NOTE — Telephone Encounter (Signed)
I called and spoke with Maudie Mercury at [place] and notified that no o2 testing was done 09/14/15  Nothing further needed

## 2015-10-31 ENCOUNTER — Telehealth: Payer: Self-pay | Admitting: Internal Medicine

## 2015-10-31 NOTE — Telephone Encounter (Signed)
Spoke with Jeani Hawking at [place], states that as of 11/15/2014 pt switched to primary insurance of Medicare.  Pt needs a qualifying 02 walk test, office note, and 02 order (all in same day) to be faxed to [facility] at [place] at [phone].  Jeani Hawking needs this to be done ASAP. lmtcb X1 for pt to call and schedule next available ov.

## 2015-11-01 NOTE — Telephone Encounter (Signed)
Spoke with pt. She has been scheduled with MW on 11/05/2015 at 4pm. Nothing further was needed.

## 2015-11-05 ENCOUNTER — Ambulatory Visit (INDEPENDENT_AMBULATORY_CARE_PROVIDER_SITE_OTHER): Payer: Medicare Other | Admitting: Internal Medicine

## 2015-11-05 ENCOUNTER — Encounter: Payer: Self-pay | Admitting: Internal Medicine

## 2015-11-05 VITALS — BP 128/62 | HR 82 | Ht 62.0 in | Wt 102.0 lb

## 2015-11-05 DIAGNOSIS — J9611 Chronic respiratory failure with hypoxia: Secondary | ICD-10-CM | POA: Diagnosis not present

## 2015-11-05 DIAGNOSIS — J449 Chronic obstructive pulmonary disease, unspecified: Secondary | ICD-10-CM | POA: Diagnosis not present

## 2015-11-05 DIAGNOSIS — F17210 Nicotine dependence, cigarettes, uncomplicated: Secondary | ICD-10-CM

## 2015-11-05 DIAGNOSIS — Z720 Tobacco use: Secondary | ICD-10-CM | POA: Diagnosis not present

## 2015-11-05 NOTE — Assessment & Plan Note (Signed)
>   3 min discussion   Discussed the risks and costs (both direct and indirect)  of smoking relative to the benefits of quitting but patient unwilling to commit at this point to a specific quit date.     Although I don't endorse regular use of e cigs/ many pts find them helpful; however, I emphasized they should be considered a "one-way bridge" off all tobacco products.

## 2015-11-05 NOTE — Assessment & Plan Note (Signed)
pfts 11/2011   FEV1 0.86 (42%) ratio 35  -- 09/14/2015  p extensive coaching HFA effectiveness =    90% so changed completely to hfa /respimat 11/05/2015 (when spiriva dpi supply runs out)  Severe but well compensated doe secondary to [place] / mostly emphysematous. Might consider changing the LABA /ICS to just the LABA/LAMA but would want to be sure the AB component wasn't an issue and this will require no smoking (see separate a/p)   For now now change = spiriva/ symbicort maint/ prn saba  I had an extended discussion with the patient reviewing all relevant studies completed to date and  lasting 15 to 20 minutes of a 25 minute visit    Each maintenance medication was reviewed in detail including most importantly the difference between maintenance and prns and under what circumstances the prns are to be triggered using an action plan format that is not reflected in the computer generated alphabetically organized AVS.    Please see instructions for details which were reviewed in writing and the patient given a copy highlighting the part that I personally wrote and discussed at today's ov.

## 2015-11-05 NOTE — Assessment & Plan Note (Signed)
Exertional desats 06/30/2014 >O2 2lm rx with act  - 11/09/2014  Walked RA  2 laps @ 185 ft each stopped due to  Sat down to 88 % at nl pace but no sob  - ono RA  09/22/15  desat < 89% x 1:64mn > rec continue 02 2lpm - 41/56/1537certification: Patient Saturations on Room Air at Rest = 94% and while Ambulating 2 laps x 185 each= 87% Patient Saturations on 2Liters of oxygen while Ambulating =96% x one additional lap - 11/05/2015  Walked RA x 4 laps @ 185 ft each stopped due to  desat to 87% resolved on 2lpm    rx as of 11/05/2015 use 02 with exercise as needed  but not adls but always 2lpm hs

## 2015-11-05 NOTE — Patient Instructions (Addendum)
You are qualified for portable 02 if you wish but no need to wear it at rest or walking around the house - continue it at bedtime thru the concentrator at 2lpm and wear the portable system as needed during the day  Ok to finish up the powder spiriva and just refill the spray.  The most important aspect of your care is whether or not you smoke - your cough and congestion should improve within a few weeks  If you have questions about your oxygen equipment, bring in the portable system and show [place] what you have so we can help sort out what you need  Change the next appointment to 3 months from now

## 2015-11-05 NOTE — Progress Notes (Signed)
Subjective:    Patient ID: [name], female    DOB: 08/15/1945   MRN: [id number]  HPI #Smoker Smoker - STarted age 70. 16 pack per day. Quit end 2012 for first time with help of wellbutrin and patch.  But recently 65 year old granddaughter from congential liver disease ("cirrhosis") moved back with her and since then smoking. Patient is primary health care provider because her daughter and 37 year old do not get along.  Smokes 1 ppd (used to smoke 2 ppd)  #Chronic cough  - Stopped ACE inhibitor fall 2013  #Lung nodule - 73m RLL March 2014  -  CT scan chest 08/19/2012: 846mnoncalcified right lower lobe nodule with irregular margins.  - PET scan 09/20/12 - indeterminate uptake   #COPD Gold stage III PFT 12/10/2011  - Gold stage 3 copd - fev1 0.8L/42%, 20% BD respinse. TLC 133%, DLCO 46% - Walk test in 2013   - pulse ox 88% at first lap 185 feet but increased to 91% at 3rd lap end  #AECOPD  - OV 05/12/2012 - treated with Levaquin and prednisone - March 2014: Failed aecopd office  treatment on 08/17/2012 and hospitalized between 08/19/2012 in 08/21/2012; discharged on 10 day prednisone taper    OV 08/27/2012  COPD: Failed aecopd office  treatment on 08/17/2012 and hospitalized between 08/19/2012 in 08/21/2012; discharged on 10 day prednisone taper. Currently feeling like she is slowly limping back to normal. FEels she needs another week off; STil lwith fatigue  New RLL nodule 53m37mdiagnosed while in hospital for aecopd.  She does not believe this is potentially lung cancer. She believes she was told `nothing to worry about` but she understands need for followup. She is not interested in intervetnion curently    REC  COPD COPD - Glad you slowly getting better after recent flareup - Continue the medications and advice of the hospitalist who treated you - Continue your regular medications for COPD - Take another week off and slowly work on gaining her strength back - When  financially possible I would recommend you take oxygen at night - When time allows I would recommend you to pulmonary rehabilitation - Start N. acetylcysteine 600 mg twice a day    #Lung nodule - Do PET scan into 10 weeks  #Smoking - Really important to quit smoking for all the various reasons as discussed  #Followup - 10 weeks with pet scan results   OV 01/06/2013 Followup for the above.  -In terms of lung nodule: We will make sure there is a followup scan of the chest in mid August 2014  - In terms of COPD: Details of symptoms are listed in the table below. COPD cat score is 23 and is around baseline but she still has significant symptoms. I noticed that she is on multiple different inhalers and has poor understanding of these. She is actually taking Combivent, Advair and Spiriva. I tried explained the differences between Spiriva and Combivent and why she should not overlap these inhalers but she got upset that is to restrict her inhaler use her logic is that when she takes Combivent it helps immediately and that Spiriva is only once a day and she does not seem to get a huge benefit from it. When I suggested that she switch Advair twice a day  to Brio once a day she got upset thinking that in switching twice a day inhaler which is superior because it is twice a day to once a day  inhaler which is potentially inferior because it once a day  Terms of smoking: She continues to smoke but acknowledges that it is important for to quit but really has no plan or does not want help rec #COPD  - stop combivent as needed. Instead take pro-air as needed  - stop advair. Instead take BREO 1 puff at night  - continue spiriva  - Please take prednisone 40 mg x1 day, then 30 mg x1 day, then 20 mg x1 day, then 10 mg x1 day, and then 5 mg x1 day and stop    #Lung nodule - Do CT scan chest mid august 2014; Weill ensure there is order  #Smoking - Really important to quit smoking for all the various  reasons as discussed  #Followup - 4 weeks ith CAT score and CT results  - see NP Tammy for followup     01/26/2013 f/u ov/Passion Lavin re abn ct chest/still smoking  Chief Complaint  Patient presents with  . Acute Visit    Pt states having slight increased SOB and hemoptysis x 5 days. She states recently dxed with PNA and has not started on tx for this yet.   at baseline says sputum yellow to green each am x years then abruptly Started feeling 8/4 weak, more sob, felt hots/ sweats/ abruptly worse 8/5 with pain R post chest and then bloody sputum on 8/9.  Variable dysphagia, sticks in throat. No obvious asp events Had dental work 2 weeks before onset  Imp ? Asp pna sup segment RLL  rec augmentin 875 twice daily x 10 days The key is to stop smoking completely before smoking completely stops you!   02/11/2013 f/u ov/Shona Pardo  Chief Complaint  Patient presents with  . Follow-up    Pt reports breathing back at her baseline. Her cough has improved with no more hemoptysis and occ white sputum.   Maint on spiriva/ advair and rare need for combivent, doe with more than slow adls rec The key is to stop smoking completely before smoking completely stops you!  Only use your albuterol (proaire) as a rescue medication to be used if you can't catch your breath by resting or doing a relaxed purse lip breathing pattern. The less you use it, the better it will work when you need it.  See Ramaswamy in 3 months with a follow cxr > did not return as rec    06/17/2013 f/u ov/Brantlee Penn re: aecopd Chief Complaint  Patient presents with  . Follow-up    Increased SOB, cough and wheezing x1 week   zpak called in on 06/13/13 and mucus turned white but still very congested cough. Using advair/ spiriva as maint and now increase need for duoneb up to twice daily  rec Prednisone 10 mg take  4 each am x 2 days, 3 x 2 days,   2 each am x 2 days,  1 each am x 2 days and stop  Only use your albuterol (proaire) as a rescue  medication  For cough >  mucinex or mucinex dm 1200 mg every 12 hours   09/26/2013 f/u ov/Kaylan Friedmann re: still smoking plus advair/ spiriva very confused with timing of maint and prns Chief Complaint  Patient presents with  . Follow-up    Breathing unchanged since the last visit. She also c/o chest tightness for the past 3 days. Cough is prod with large amounts of clear sputum. She uses rescue inhaler approx 2 times per wk on average and also using duoneb a  few times per day.   doe x > room to room walking/ not using mucinex as rec, no purulent sputum Chest tightness better p saba  >>pred taper   10/11/2013 Follow up  Returns for follow up for recent COPD flare.  Was given steroid taper last ov. Feeling much better.  Reports breathing is improved since last ov, Doing well with the Symbicort.  no new complaints. Discussed smoking cessatioin  No fever, chest pain, orthopnea, or edema  CXR last ov showed improved right suprahilar infiltrate.  rec Continue on Symbicort and Spiriva , rinse after use.  MOST IMPORTANT GOAL IS TO QUIT SMOKING.  follow up Dr. Melvyn Novas  In 3 months and As needed      01/16/2014 f/u ov/Edwin Cherian re: GOLD III COPD / insurance requiring trial of incruse over spiriva Chief Complaint  Patient presents with  . Follow-up    Pt states that overall her breathing is doing well. Some increased chest tightness last wk which she relates to humid weather- had to use rescue inhaler a few times last wk.   rec Add incruse 2 puffs off one click each am only,  And only after the am  symbicort Work on inhaler technique:    Prevnar given today.    04/05/2014 f/u ov/Raigan Baria re: copd GOLD III still smoking  Chief Complaint  Patient presents with  . Follow-up    Breathing is about the same, has good days and bad days.  Using albuterol inhaler about once per day.      continues to struggle with understanding insurance restrictions / alternatives >>wellbutrin rx , tiral of BREO instead of  symbicort   06/30/2014 Acute OV  Complains of cough, congestion, wheezing and tightness for 1 week. Was called in [place] and pred taper on 1/11. Says she is some better but still wheezing. Did notice O2 sats dropped 80s when walking initially.  Today in ofifce O2 sats at rest >90% on RA, walking dropped to , on 2l/m kept sats >90%  She is on O2 at home  At bedtime   No chest pain, orthopnea, edema or fever. Remains on Symbicort, did not like BREO .  Still smoking . rec Prednisone taper over next week  Finish ZPack .  Chest xray today .  Begin O2 at 2l/m with activity and continue on O2 At bedtime  .  Mucinex DM Twice daily  As needed  Cough/congestion    08/11/2014 f/u ov/Elner Seifert re: GOLD III still smoking / spiriva/ symbicort  Chief Complaint  Patient presents with  . Follow-up    Pt states that her breathing is doing well.  Her cough has improved.   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 Follow-up    No change in breathing since last OV. Pt notes some hemoptysis at times.    back at work despite chronic doe = MMRC2 = can't walk a nl pace on a flat grade s sob Hemoptysis = min blood streaking x sev episodes x sev months, most of am mucus is clear needs for hfa saba maybe once a day/ very rarely needs neb  rec Try spiriva respimat 2 pffs each am in place of the powder The key is to stop smoking completely before smoking completely stops you!       10/08/2015  f/u ov/Oluwanifemi Petitti re: 02 recert/still smoking/ GOLD III Chief Complaint  Patient presents with  . Follow-up    Pt here per request of [facility] to recert for amb o2. She has POC at home, but has really only been using  o2 with sleep.   very confused with 02 instructions/ tanks vs concentrator Doe = MMRC2 = can't walk a nl pace on a flat grade s sob but does fine slow and flat like shopping leaning on cart fine, never tried walking on portable 02  rec You are qualified for portable 02 if you wish but no need to wear it at rest or walking around the house - continue it at bedtime thru the concentrator at 2lpm  Ok to finish up the powder spiriva and just refill the spray. The most important aspect of your care is whether or not you smoke - your cough and congestion should improve within a few weeks If you have questions about your oxygen equipment, bring in the portable system and show [place] what you have so we can help sort out what you need Change the next appointment to 3 months from now    11/05/2015  f/u ov/Omeka Holben re: GOLD III / still smoking / maint rx symb/spriiva dpi  Chief Complaint  Patient presents with  . Follow-up    pt states breathing is baseline. c/o cont SOB, prod cough tan to greenish in color, wheezing, cp/tightness  dyspnea is variable and has portable tanks but never uses them to see if makes any difference in her variable doe / using saba up to 4 x daily instead.      No obvious day to day or daytime variabilty or assoc   mucus plugs   chest tightness,  overt sinus or hb symptoms. No unusual exp hx or h/o childhood pna/ asthma or knowledge of premature birth.  Sleeping ok without nocturnal  or early am exacerbation  of respiratory  c/o's or need for noct saba. Also denies any obvious fluctuation of symptoms with weather or environmental changes or other aggravating or alleviating factors except as outlined above   Current Medications, Allergies, Complete Past Medical History, Past Surgical History, Family History, and Social History were reviewed in [employer] record.  ROS  The following are not active complaints unless bolded sore throat, dysphagia, dental problems,  itching, sneezing,  nasal congestion or excess/ purulent secretions, ear ache,   fever, chills, sweats, unintended wt loss,   exertional cp, hemoptysis,  orthopnea pnd or leg swelling, presyncope, palpitations, heartburn, abdominal pain, anorexia, nausea, vomiting, diarrhea  or change in bowel or urinary habits, change in stools or urine, dysuria,hematuria,  rash, arthralgias, visual complaints, headache, numbness weakness or ataxia or problems with walking or coordination,  change in mood/affect or memory.             Objective:   Physical Exam   Chronically ill amb nad /minimally congested rattly  sounding cough  / vital signs reviewed    09/26/2013  101>102 10/11/2013 > 01/16/2014  99 > 04/06/2014  103 >105 06/30/2014 > 08/11/2014  101 > 11/09/2014  104 > 01/22/2015 106  > 02/06/2015 105 > 03/08/2015 106 > 04/18/2015  106 > 06/15/2015 103 > 09/14/2015   102 > 10/08/2015   103 > 11/05/2015  102    HEENT mild turbinate edema.  Oropharynx no thrush or excess pnd or cobblestoning.  No JVD or cervical adenopathy. Mild accessory muscle hypertrophy. Trachea midline, nl thryroid. Chest was hyperinflated by percussion with diminished breath sounds,  Late distant exp rhonchi bilaterally  . Regular rate and rhythm without murmur gallop or rub or increase P2 or edema.  Abd: no hsm, nl excursion. Ext warm without cyanosis or clubbing.  MS  Nl rom/ no deformities       CXR PA and Lateral:   09/14/2015 :    I personally reviewed images and agree with radiology impression as follows:    Stable linear scarring over the right upper lobe. Moderate emphysematous disease. Pleural parenchymal scarring versus small amount of bilateral pleural fluid posteriorly.      Assessment & Plan:

## 2015-11-14 ENCOUNTER — Other Ambulatory Visit: Payer: Self-pay | Admitting: Internal Medicine

## 2015-11-14 MED ORDER — BUDESONIDE-FORMOTEROL FUMARATE 160-4.5 MCG/ACT IN AERO
INHALATION_SPRAY | RESPIRATORY_TRACT | Status: DC
Start: 2015-11-14 — End: 2016-02-05

## 2015-12-14 ENCOUNTER — Ambulatory Visit: Payer: Medicare Other | Admitting: Internal Medicine

## 2015-12-20 ENCOUNTER — Encounter (HOSPITAL_COMMUNITY): Payer: Self-pay

## 2015-12-20 ENCOUNTER — Emergency Department (HOSPITAL_COMMUNITY)
Admission: EM | Admit: 2015-12-20 | Discharge: 2015-12-20 | Disposition: A | Discharge status: Home / Self Care | Payer: Medicare Other | Attending: Emergency Medicine | Admitting: Emergency Medicine

## 2015-12-20 DIAGNOSIS — F17210 Nicotine dependence, cigarettes, uncomplicated: Secondary | ICD-10-CM | POA: Diagnosis not present

## 2015-12-20 DIAGNOSIS — Z79899 Other long term (current) drug therapy: Secondary | ICD-10-CM | POA: Insufficient documentation

## 2015-12-20 DIAGNOSIS — I10 Essential (primary) hypertension: Secondary | ICD-10-CM | POA: Diagnosis not present

## 2015-12-20 DIAGNOSIS — J449 Chronic obstructive pulmonary disease, unspecified: Secondary | ICD-10-CM | POA: Insufficient documentation

## 2015-12-20 DIAGNOSIS — L089 Local infection of the skin and subcutaneous tissue, unspecified: Secondary | ICD-10-CM

## 2015-12-20 DIAGNOSIS — M7989 Other specified soft tissue disorders: Secondary | ICD-10-CM | POA: Diagnosis present

## 2015-12-20 DIAGNOSIS — S80812D Abrasion, left lower leg, subsequent encounter: Secondary | ICD-10-CM | POA: Diagnosis not present

## 2015-12-20 DIAGNOSIS — W208XXD Other cause of strike by thrown, projected or falling object, subsequent encounter: Secondary | ICD-10-CM | POA: Insufficient documentation

## 2015-12-20 LAB — BASIC METABOLIC PANEL
Anion gap: 11 (ref 5–15)
BUN: 16 mg/dL (ref 6–20)
CALCIUM: 9.4 mg/dL (ref 8.9–10.3)
CO2: 25 mmol/L (ref 22–32)
CREATININE: 0.67 mg/dL (ref 0.44–1.00)
Chloride: 97 mmol/L — ABNORMAL LOW (ref 101–111)
GFR calc Af Amer: >60 mL/min (ref >60)
GLUCOSE: 97 mg/dL (ref 65–99)
Potassium: 4.3 mmol/L (ref 3.5–5.1)
Sodium: 133 mmol/L — ABNORMAL LOW (ref 135–145)

## 2015-12-20 LAB — CBC WITH DIFFERENTIAL/PLATELET
BASOS ABS: 0 10*3/uL (ref 0.0–0.1)
Basophils Relative: 0 %
EOS PCT: 7 %
Eosinophils Absolute: 0.4 10*3/uL (ref 0.0–0.7)
HCT: 44.3 % (ref 36.0–46.0)
Hemoglobin: 15.3 g/dL — ABNORMAL HIGH (ref 12.0–15.0)
LYMPHS PCT: 9 %
Lymphs Abs: 0.5 10*3/uL — ABNORMAL LOW (ref 0.7–4.0)
MCH: 30.4 pg (ref 26.0–34.0)
MCHC: 34.5 g/dL (ref 30.0–36.0)
MCV: 87.9 fL (ref 78.0–100.0)
MONO ABS: 0.8 10*3/uL (ref 0.1–1.0)
Monocytes Relative: 14 %
Neutro Abs: 4.1 10*3/uL (ref 1.7–7.7)
Neutrophils Relative %: 70 %
PLATELETS: 209 10*3/uL (ref 150–400)
RBC: 5.04 MIL/uL (ref 3.87–5.11)
RDW: 13.6 % (ref 11.5–15.5)
WBC: 5.9 10*3/uL (ref 4.0–10.5)

## 2015-12-20 LAB — URINALYSIS, ROUTINE W REFLEX MICROSCOPIC
BILIRUBIN URINE: NEGATIVE
Glucose, UA: NEGATIVE mg/dL
HGB URINE DIPSTICK: NEGATIVE
Ketones, ur: 15 mg/dL — AB
Leukocytes, UA: NEGATIVE
Nitrite: NEGATIVE
Protein, ur: NEGATIVE mg/dL
SPECIFIC GRAVITY, URINE: 1.018 (ref 1.005–1.030)
pH: 6.5 (ref 5.0–8.0)

## 2015-12-20 LAB — I-STAT CG4 LACTIC ACID, ED: LACTIC ACID, VENOUS: 1.63 mmol/L (ref 0.5–1.9)

## 2015-12-20 MED ORDER — SODIUM CHLORIDE 0.9 % IV BOLUS (SEPSIS)
500.0000 mL | Freq: Once | INTRAVENOUS | Status: AC
  Administered 2015-12-20: 500 mL via INTRAVENOUS

## 2015-12-20 MED ORDER — SODIUM CHLORIDE 0.9 % IV SOLN
INTRAVENOUS | Status: DC
  Administered 2015-12-20: 18:00:00 via INTRAVENOUS

## 2015-12-20 NOTE — Discharge Instructions (Signed)
Get plenty of rest and drink a lot of fluids. Try to eat 3 meals a day. Use Tylenol every 4 hours if needed for pain or fever.    Cellulitis Cellulitis is an infection of the skin and the tissue beneath it. The infected area is usually red and tender. Cellulitis occurs most often in the arms and lower legs.  CAUSES  Cellulitis is caused by bacteria that enter the skin through cracks or cuts in the skin. The most common types of bacteria that cause cellulitis are staphylococci and streptococci. SIGNS AND SYMPTOMS   Redness and warmth.  Swelling.  Tenderness or pain.  Fever. DIAGNOSIS  Your health care provider can usually determine what is wrong based on a physical exam. Blood tests may also be done. TREATMENT  Treatment usually involves taking an antibiotic medicine. HOME CARE INSTRUCTIONS   Take your antibiotic medicine as directed by your health care provider. Finish the antibiotic even if you start to feel better.  Keep the infected arm or leg elevated to reduce swelling.  Apply a warm cloth to the affected area up to 4 times per day to relieve pain.  Take medicines only as directed by your health care provider.  Keep all follow-up visits as directed by your health care provider. SEEK MEDICAL CARE IF:   You notice red streaks coming from the infected area.  Your red area gets larger or turns dark in color.  Your bone or joint underneath the infected area becomes painful after the skin has healed.  Your infection returns in the same area or another area.  You notice a swollen bump in the infected area.  You develop new symptoms.  You have a fever. SEEK IMMEDIATE MEDICAL CARE IF:   You feel very sleepy.  You develop vomiting or diarrhea.  You have a general ill feeling (malaise) with muscle aches and pains.   This information is not intended to replace advice given to you by your health care provider. Make sure you discuss any questions you have with your  health care provider.   Document Released: 03/12/2005 Document Revised: 02/21/2015 Document Reviewed: 08/18/2011 Elsevier Interactive Patient Education [employer].

## 2015-12-20 NOTE — ED Notes (Signed)
Pt c/o fever and LLE swelling r/t cellulitis.  Pain score 4/10.  Pt reports she was diagnosed w/ cellulitis x 6 days ago by [facility] and started on Bactrim.  Pt reports symptoms have not resolved.  Redness and 2 wounds noted to LLE.

## 2015-12-20 NOTE — ED Provider Notes (Signed)
CSN: [id number]     Arrival date & time 12/20/15  1243 History   First MD Initiated Contact with Patient 12/20/15 1532     Chief Complaint  Patient presents with  . Fever  . Cellulitis     (Consider location/radiation/quality/duration/timing/severity/associated sxs/prior Treatment) HPI   [name] is a 70 y.o. female who presents for evaluation of leg swelling, chills, and fever for one week. She was treated for cellulitis with Bactrim, 6 days ago. She denies nausea or vomiting but has had decreased appetite. She feels like the swelling in her left leg has improved somewhat. She injured her left lower leg, when a bottle dropped out of the refrigerator onto it about 10 days ago. No prior similar problems. There are no other known modifying factors.  Past Medical History  Diagnosis Date  . COPD (chronic obstructive pulmonary disease) ([place])   . Shortness of breath dyspnea     with exertion  . On home oxygen therapy     at night 2L/min  . Hypertension     states under control with meds., has been on med. > 10 yr.  . Wears dentures     upper  . Wears partial dentures     lower  . Cataract, immature   . Thin skin   . 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 Rotator cuff strain 09/2014    left  . Impingement syndrome of left shoulder region 09/2014  . Bicipital tendonitis of left shoulder 09/2014   Past Surgical History  Procedure Laterality Date  . No past surgeries    . Shoulder arthroscopy with rotator cuff repair and subacromial decompression Left 10/12/2014    Procedure: LEFT SHOULDER ARTHROSCOPY, DEBRIDEMENT WITH ACROMIOPLASTY,  ROTATOR CUFF REPAIR AND RELEASE BICEPS TENDON;  Surgeon: Kathryne Hitch, MD;  Location: [place];  Service: Orthopedics;  Laterality: Left;  . Resection distal clavical Left 10/12/2014    Procedure: DISTAL CLAVICLE EXCISION;  Surgeon: Kathryne Hitch, MD;  Location: [place];  Service: Orthopedics;   Laterality: Left;   Family History  Problem Relation Age of Onset  . Heart disease Father   . Kidney failure Father    Social History  Substance Use Topics  . Smoking status: Current Every Day Smoker -- 1.00 packs/day for 57 years    Types: Cigarettes  . Smokeless tobacco: Never Used  . Alcohol Use: 0.0 oz/week    0 Standard drinks or equivalent per week     Comment: several beers/day and "a shot of whiskey"   OB History    No data available     Review of Systems  All other systems reviewed and are negative.     Allergies  Tramadol  Home Medications   Prior to Admission medications   Medication Sig Start Date End Date Taking? Authorizing Provider  albuterol (PROVENTIL) (2.5 MG/3ML) 0.083% nebulizer solution Take 3 mLs (2.5 mg total) by nebulization every 6 (six) hours as needed for wheezing or shortness of breath. 10/08/15  Yes Tanda Rockers, MD  budesonide-formoterol [facility]) 160-4.5 MCG/ACT inhaler Take 2 puffs first thing in am and then another 2 puffs about 12 hours later. 11/14/15  Yes Tanda Rockers, MD  CARTIA XT 300 MG 24 hr capsule Take 300 mg by mouth daily. 12/11/15  Yes Historical Provider, MD  cholecalciferol (VITAMIN D) 1000 UNITS tablet Take 1,000 Units by mouth daily.   Yes Historical Provider, MD  HYDROcodone-acetaminophen (NORCO) 5-325 MG  tablet Take 0.5 tablets by mouth every 8 (eight) hours as needed for moderate pain.  12/14/15  Yes Historical Provider, MD  losartan (COZAAR) 100 MG tablet Take 100 mg by mouth daily. 12/10/15  Yes Historical Provider, MD  Multiple Minerals (CALCIUM-MAGNESIUM-ZINC) TABS Take 1 tablet by mouth daily.   Yes Historical Provider, MD  ondansetron (ZOFRAN) 4 MG tablet Take 1 tablet (4 mg total) by mouth every 8 (eight) hours as needed for nausea or vomiting. 01/22/15  Yes Tanda Rockers, MD  promethazine (PHENERGAN) 12.5 MG tablet Take 1 tablet with Norco as needed for nausea 01/26/15  Yes Tanda Rockers, MD   sulfamethoxazole-trimethoprim (BACTRIM,SEPTRA) 400-80 MG tablet Take 1 tablet by mouth 2 (two) times daily. 12/14/15 12/24/15 Yes Historical Provider, MD  Teriparatide, Recombinant, (FORTEO) 600 MCG/2.4ML SOLN Inject 20 mcg into the skin daily.   Yes Historical Provider, MD  Tiotropium Bromide Monohydrate (SPIRIVA RESPIMAT) 2.5 MCG/ACT AERS 2 pffs each am 09/14/15  Yes Tanda Rockers, MD  VENTOLIN HFA 108 (90 BASE) MCG/ACT inhaler inhale 2 puffs by mouth every 4 hours if needed for wheezing or shortness of breath  (USE ONLY IF YOU CAN'T CATCH YOUR BREATH) 12/05/14  Yes Tanda Rockers, MD   BP 153/88 mmHg  Pulse 108  Temp(Src) 100 F (37.8 C) (Oral)  Resp 20  SpO2 92% Physical Exam  Constitutional: She is oriented to person, place, and time. She appears well-developed and well-nourished.  HENT:  Head: Normocephalic and atraumatic.  Right Ear: External ear normal.  Left Ear: External ear normal.  Eyes: Conjunctivae and EOM are normal. Pupils are equal, round, and reactive to light.  Neck: Normal range of motion and phonation normal. Neck supple.  Cardiovascular: Normal rate, regular rhythm and normal heart sounds.   Pulmonary/Chest: Effort normal and breath sounds normal. She exhibits no bony tenderness.  Abdominal: Soft. There is no tenderness.  Musculoskeletal: Normal range of motion.  Neurological: She is alert and oriented to person, place, and time. No cranial nerve deficit or sensory deficit. She exhibits normal muscle tone. Coordination normal.  Skin: Skin is warm, dry and intact.  Psychiatric: She has a normal mood and affect. Her behavior is normal. Judgment and thought content normal.  Nursing note and vitals reviewed.   ED Course  Procedures (including critical care time)  Medications  sodium chloride 0.9 % bolus 500 mL (0 mLs Intravenous Stopped 12/20/15 1753)    Patient Vitals for the past 24 hrs:  BP Temp Temp src Pulse Resp SpO2  12/20/15 1801 153/88 mmHg 100 F (37.8  C) Oral 108 20 92 %  12/20/15 1251 121/68 mmHg 99.6 F (37.6 C) Oral 89 18 98 %    At D/C Reevaluation with update and discussion. After initial assessment and treatment, an updated evaluation reveals no further c/o. Findings discussed. Cornelious Diven L    Labs Review Labs Reviewed  BASIC METABOLIC PANEL - Abnormal; Notable for the following:    Sodium 133 (*)    Chloride 97 (*)    All other components within normal limits  CBC WITH DIFFERENTIAL/PLATELET - Abnormal; Notable for the following:    Hemoglobin 15.3 (*)    Lymphs Abs 0.5 (*)    All other components within normal limits  URINALYSIS, ROUTINE W REFLEX MICROSCOPIC (NOT AT [facility]) - Abnormal; Notable for the following:    Ketones, ur 15 (*)    All other components within normal limits  I-STAT CG4 LACTIC ACID, ED    Imaging  Review No results found. I have personally reviewed and evaluated these images and lab results as part of my medical decision-making.   EKG Interpretation None      MDM   Final diagnoses:  Abrasion, leg w/ infection, left, subsequent encounter    Resolving cellulitis related to injury. Doubt sepsis.  Nursing Notes Reviewed/ Care Coordinated Applicable Imaging Reviewed Interpretation of Laboratory Data incorporated into ED treatment  The patient appears reasonably screened and/or stabilized for discharge and I doubt any other medical condition or other [facility] requiring further screening, evaluation, or treatment in the ED at this time prior to discharge.  Plan: Home Medications- usual; Home Treatments- rest, heat; return here if the recommended treatment, does not improve the symptoms; Recommended follow up- PCP prn     Daleen Bo, MD 12/21/15 1138

## 2016-01-07 ENCOUNTER — Ambulatory Visit: Payer: Medicare Other | Admitting: Internal Medicine

## 2016-02-05 ENCOUNTER — Encounter: Payer: Self-pay | Admitting: Internal Medicine

## 2016-02-05 ENCOUNTER — Ambulatory Visit (INDEPENDENT_AMBULATORY_CARE_PROVIDER_SITE_OTHER): Payer: Medicare Other | Admitting: Internal Medicine

## 2016-02-05 VITALS — BP 128/72 | HR 81 | Ht 62.0 in | Wt 102.0 lb

## 2016-02-05 DIAGNOSIS — J449 Chronic obstructive pulmonary disease, unspecified: Secondary | ICD-10-CM

## 2016-02-05 DIAGNOSIS — F17210 Nicotine dependence, cigarettes, uncomplicated: Secondary | ICD-10-CM | POA: Diagnosis not present

## 2016-02-05 MED ORDER — FLUTTER DEVI: 0 refills | Status: DC

## 2016-02-05 MED ORDER — TIOTROPIUM BROMIDE MONOHYDRATE 2.5 MCG/ACT IN AERS: INHALATION_SPRAY | RESPIRATORY_TRACT | 0 refills | Status: DC

## 2016-02-05 MED ORDER — BUDESONIDE-FORMOTEROL FUMARATE 160-4.5 MCG/ACT IN AERO: INHALATION_SPRAY | RESPIRATORY_TRACT | 0 refills | Status: DC

## 2016-02-05 NOTE — Progress Notes (Signed)
Subjective:    Patient ID: [name], female    DOB: 05-14-1946   MRN: [id number]  Brief patient profile:  52 yowf active smoker followed in pulmonary clinic for GOLD III copd     History of Present Illness  06/17/2013 f/u ov/Briggett Tuccillo re: aecopd Chief Complaint  Patient presents with  . Follow-up    Increased SOB, cough and wheezing x1 week   zpak called in on 06/13/13 and mucus turned white but still very congested cough. Using advair/ spiriva as maint and now increase need for duoneb up to twice daily  rec Prednisone 10 mg take  4 each am x 2 days, 3 x 2 days,   2 each am x 2 days,  1 each am x 2 days and stop  Only use your albuterol (proaire) as a rescue medication  For cough >  mucinex or mucinex dm 1200 mg every 12 hours   11/05/2015  f/u ov/Jerame Hedding re: GOLD III / still smoking / maint rx symb/spriiva dpi  Chief Complaint  Patient presents with  . Follow-up    pt states breathing is baseline. c/o cont SOB, prod cough tan to greenish in color, wheezing, cp/tightness  dyspnea is variable and has portable tanks but never uses them to see if makes any difference in her variable doe / using saba up to 4 x daily instead.   rec You are qualified for portable 02 if you wish but no need to wear it at rest or walking around the house - continue it at bedtime thru the concentrator at 2lpm and wear the portable system as needed during the day Ok to finish up the powder spiriva and just refill the spray    02/05/2016  f/u ov/Ameer Sanden re:  GOLD III/ still smoking/ symb/spiriva / 02 2lpm hs only  Chief Complaint  Patient presents with  . Follow-up    pt states her SOB overall is unchanged since last OV in 10/2015. Pt c/o slight increase in cough with thick white to yellow mucus and occassional chest tightness with activity and heat.    using saba late afternoon not noct or in am   Doe = MMRC2 = can't walk a nl pace on a flat grade s sob but does fine slow and flat eg shopping   No obvious day  to day or daytime variabilty or assoc   mucus plugs   chest tightness,  overt sinus or hb symptoms. No unusual exp hx or h/o childhood pna/ asthma or knowledge of premature birth.  Sleeping ok without nocturnal  or early am exacerbation  of respiratory  c/o's or need for noct saba. Also denies any obvious fluctuation of symptoms with weather or environmental changes or other aggravating or alleviating factors except as outlined above   Current Medications, Allergies, Complete Past Medical History, Past Surgical History, Family History, and Social History were reviewed in [employer] record.  ROS  The following are not active complaints unless bolded sore throat, dysphagia, dental problems, itching, sneezing,  nasal congestion or excess/ purulent secretions, ear ache,   fever, chills, sweats, unintended wt loss,   exertional cp, hemoptysis,  orthopnea pnd or leg swelling, presyncope, palpitations, heartburn, abdominal pain, anorexia, nausea, vomiting, diarrhea  or change in bowel or urinary habits, change in stools or urine, dysuria,hematuria,  rash, arthralgias, visual complaints, headache, numbness weakness or ataxia or problems with walking or coordination,  change in mood/affect or memory.  Objective:   Physical Exam   Chronically ill amb nad /moderately congested rattly  sounding cough  / vital signs reviewed    09/26/2013  101>102 10/11/2013 > 01/16/2014  99 > 04/06/2014  103 >105 06/30/2014 > 08/11/2014  101 > 11/09/2014  104 > 01/22/2015 106  > 02/06/2015 105 > 03/08/2015 106 > 04/18/2015  106 > 06/15/2015 103 > 09/14/2015   102 > 10/08/2015   103 > 11/05/2015  102 > 02/05/2016  103   HEENT mild turbinate edema.  Oropharynx no thrush or excess pnd or cobblestoning.  No JVD or cervical adenopathy. Mild accessory muscle hypertrophy. Trachea midline, nl thryroid. Chest was hyperinflated by percussion with diminished breath sounds,  Late distant exp rhonchi bilaterally/  cough on exp  . Regular rate and rhythm without murmur gallop or rub or increase P2 or edema.  Abd: no hsm, nl excursion. Ext warm without cyanosis or clubbing.  MS  Nl rom/ no deformities        Assessment & Plan:

## 2016-02-05 NOTE — Patient Instructions (Addendum)
For cough use mucinex or mucinex dm up to 1200 mg every 12 hours and flutter valve as much possible   The key is to stop smoking completely before smoking completely stops you!   Please schedule a follow up visit in 3 months but call sooner if needed

## 2016-02-06 ENCOUNTER — Encounter: Payer: Self-pay | Admitting: Internal Medicine

## 2016-02-06 NOTE — Assessment & Plan Note (Signed)

## 2016-02-06 NOTE — Assessment & Plan Note (Signed)
pfts 11/2011   FEV1 0.86 (42%) ratio 35  -- 09/14/2015  p extensive coaching HFA effectiveness =    90% so changed completely to hfa /respimat 11/05/2015 (when spiriva dpi supply runs out)  - flutter valve added 02/05/2016 and instructions on use reviewed  Main issue at this point is poor mc function from ongoing cig use (see separate a/p)   I had an extended discussion with the patient reviewing all relevant studies completed to date and  lasting 15 to 20 minutes of a 25 minute visit    Each maintenance medication was reviewed in detail including most importantly the difference between maintenance and prns and under what circumstances the prns are to be triggered using an action plan format that is not reflected in the computer generated alphabetically organized AVS.    Please see instructions for details which were reviewed in writing and the patient given a copy highlighting the part that I personally wrote and discussed at today's ov.

## 2016-05-14 ENCOUNTER — Encounter: Payer: Self-pay | Admitting: Internal Medicine

## 2016-05-14 ENCOUNTER — Ambulatory Visit: Payer: Medicare Other | Admitting: Emergency Medicine

## 2016-05-14 ENCOUNTER — Ambulatory Visit (INDEPENDENT_AMBULATORY_CARE_PROVIDER_SITE_OTHER): Payer: Medicare Other | Admitting: Internal Medicine

## 2016-05-14 VITALS — BP 110/70 | HR 81 | Ht 62.0 in | Wt 101.6 lb

## 2016-05-14 DIAGNOSIS — J9611 Chronic respiratory failure with hypoxia: Secondary | ICD-10-CM | POA: Diagnosis not present

## 2016-05-14 DIAGNOSIS — J449 Chronic obstructive pulmonary disease, unspecified: Secondary | ICD-10-CM

## 2016-05-14 DIAGNOSIS — F17210 Nicotine dependence, cigarettes, uncomplicated: Secondary | ICD-10-CM

## 2016-05-14 MED ORDER — TIOTROPIUM BROMIDE MONOHYDRATE 2.5 MCG/ACT IN AERS: INHALATION_SPRAY | RESPIRATORY_TRACT | 1 refills | Status: DC

## 2016-05-14 MED ORDER — BUDESONIDE-FORMOTEROL FUMARATE 160-4.5 MCG/ACT IN AERO: INHALATION_SPRAY | RESPIRATORY_TRACT | 1 refills | Status: DC

## 2016-05-14 NOTE — Progress Notes (Signed)
Subjective:    Patient ID: [name], female    DOB: 04-Aug-1945   MRN: [id number]  Brief patient profile:  46 yowf active smoker followed in pulmonary clinic for GOLD III copd     History of Present Illness  11/05/2015  f/u ov/Lalita Ebel re: GOLD III / still smoking / maint rx symb/spriiva dpi  Chief Complaint  Patient presents with  . Follow-up    pt states breathing is baseline. c/o cont SOB, prod cough tan to greenish in color, wheezing, cp/tightness  dyspnea is variable and has portable tanks but never uses them to see if makes any difference in her variable doe / using saba up to 4 x daily instead.   rec You are qualified for portable 02 if you wish but no need to wear it at rest or walking around the house - continue it at bedtime thru the concentrator at 2lpm and wear the portable system as needed during the day Ok to finish up the powder spiriva and just refill the spray    02/05/2016  f/u ov/Cecilia Nishikawa re:  GOLD III/ still smoking/ symb/spiriva / 02 2lpm hs only  Chief Complaint  Patient presents with  . Follow-up    pt states her SOB overall is unchanged since last OV in 10/2015. Pt c/o slight increase in cough with thick white to yellow mucus and occassional chest tightness with activity and heat.    using saba late afternoon not noct or in am   Doe = MMRC2  rec For cough use mucinex or mucinex dm up to 1200 mg every 12 hours and flutter valve as much possible  The key is to stop smoking completely before smoking completely stops you!    05/14/2016  f/u ov/Nelsy Madonna re: copd III / still smoking/ symb/ spiriva 02 2lpm hs only and saba maybe once in 24 h  Chief Complaint  Patient presents with  . Follow-up    Cough is unchanged and her breathing is about the same. She has good days and bad. Cough is prod with clear sputum.    MMRC2 = can't walk a nl pace on a flat grade s sob but does fine slow and flat eg shopping ok, not wearing 02    occ severe am cough/ not using flutter / not  sure it's working    No obvious day to day or daytime variabilty or assoc   mucus plugs   chest tightness,  overt sinus or hb symptoms. No unusual exp hx or h/o childhood pna/ asthma or knowledge of premature birth.  Sleeping ok without nocturnal  or early am exacerbation  of respiratory  c/o's or need for noct saba. Also denies any obvious fluctuation of symptoms with weather or environmental changes or other aggravating or alleviating factors except as outlined above   Current Medications, Allergies, Complete Past Medical History, Past Surgical History, Family History, and Social History were reviewed in [employer] record.  ROS  The following are not active complaints unless bolded sore throat, dysphagia, dental problems, itching, sneezing,  nasal congestion or excess/ purulent secretions, ear ache,   fever, chills, sweats, unintended wt loss,   exertional cp, hemoptysis,  orthopnea pnd or leg swelling, presyncope, palpitations, heartburn, abdominal pain, anorexia, nausea, vomiting, diarrhea  or change in bowel or urinary habits, change in stools or urine, dysuria,hematuria,  rash, arthralgias, visual complaints, headache, numbness weakness or ataxia or problems with walking or coordination,  change in mood/affect or memory.  Objective:   Physical Exam   Chronically ill amb nad /moderately congested rattly  sounding cough  / vital signs reviewed - Note on arrival 02 sats  94% on RA     09/26/2013  101>102 10/11/2013 > 01/16/2014  99 > 04/06/2014  103 >105 06/30/2014 > 08/11/2014  101 > 11/09/2014  104 > 01/22/2015 106  > 02/06/2015 105 > 03/08/2015 106 > 04/18/2015  106 > 06/15/2015 103 > 09/14/2015   102 > 10/08/2015   103 > 11/05/2015  102 > 02/05/2016  103 > 05/14/2016 102   HEENT mild turbinate edema.  Oropharynx no thrush or excess pnd or cobblestoning.  No JVD or cervical adenopathy. Mild accessory muscle hypertrophy. Trachea midline, nl thryroid. Chest was  hyperinflated by percussion with diminished breath sounds,  distant exp rhonchi bilaterally/ cough at end  exp  . Regular rate and rhythm without murmur gallop or rub or increase P2 or edema.  Abd: no hsm, nl excursion. Ext warm without cyanosis or clubbing.  MS  Nl rom/ no deformities        Assessment & Plan:

## 2016-05-14 NOTE — Patient Instructions (Addendum)
The key is to stop smoking completely before smoking completely stops you!   Please schedule a follow up visit in 6 months but call sooner if needed

## 2016-05-15 NOTE — Assessment & Plan Note (Addendum)
>  3 min discussion  I emphasized that her smoking is the main cause of her cough and that the effects on mucociliary clearance will improve typically within 6 weeks but that other that flutter valve/ bronchodilators and mucinex there is no other way to improve this problem other than commit to quit now.  She said she would consider it > Follow up per Primary Care planned

## 2016-05-15 NOTE — Assessment & Plan Note (Signed)
pfts 11/2011   FEV1 0.86 (42%) ratio 35  -- 09/14/2015  hfa Arlana Pouch 11/05/2015 (when spiriva dpi supply runs out)   - flutter valve added 02/05/2016  And recoached 05/14/2016  - 05/14/2016  After extensive coaching HFA effectiveness =    75%    Group D in terms of symptom/risk and laba/lama/ICS  therefore appropriate rx at this point.   Each maintenance medication was reviewed in detail including most importantly the difference between maintenance and as needed and under what circumstances the prns are to be used.  Please see AVS for  customized  Instructions which were reviewed in detail in writing with the patient and a copy provided.

## 2016-05-15 NOTE — Assessment & Plan Note (Signed)
Exertional desats 06/30/2014 >O2 2lm rx with act  - 11/09/2014  Walked RA  2 laps @ 185 ft each stopped due to  Sat down to 88 % at nl pace but no sob  - ono RA  09/22/15  desat < 89% x 1:35mn > rec continue 02 2lpm - 49/84/7308certification: Patient Saturations on Room Air at Rest = 94% and while Ambulating 2 laps x 185 each= 87% Patient Saturations on 2Liters of oxygen while Ambulating =96% x one additional lap - 11/05/2015  Walked RA x 4 laps @ 185 ft each stopped due to  desat to 87% resolved on 2lpm    rx as of 05/14/2016 use 02 with exercise as needed  but not adls but always 2lpm hs

## 2016-08-17 ENCOUNTER — Other Ambulatory Visit: Payer: Self-pay | Admitting: Internal Medicine

## 2016-09-13 ENCOUNTER — Other Ambulatory Visit: Payer: Self-pay | Admitting: Internal Medicine

## 2016-09-13 DIAGNOSIS — J449 Chronic obstructive pulmonary disease, unspecified: Secondary | ICD-10-CM

## 2016-09-15 ENCOUNTER — Telehealth: Payer: Self-pay | Admitting: Internal Medicine

## 2016-09-15 MED ORDER — AZITHROMYCIN 250 MG PO TABS: 250.0000 mg | ORAL_TABLET | ORAL | 0 refills | Status: DC

## 2016-09-15 NOTE — Telephone Encounter (Signed)
Pt c/o cough, chest congestion with thick green mucus, wheezing, headache, SOB, low grade temp (99.0) x 1 week. Denies chest tightness or pain at this time -- pt reports having this in the beginning. Pt using Vitamin C for her symptoms.  Requests something called into [facility] on [address].   Please advise Dr Vaughan Browner, Dr Melvyn Novas is not available. Thanks.

## 2016-09-15 NOTE — Telephone Encounter (Signed)
Call in Z pack

## 2016-09-15 NOTE — Telephone Encounter (Signed)
Pt aware that Zapk has been called in. Nothing further needed.

## 2016-09-16 NOTE — Telephone Encounter (Signed)
Take 2 tabs on day 1, then 1 tab daily until gone.   lmtcb X1 for pt to relay recs.

## 2016-09-16 NOTE — Telephone Encounter (Signed)
Spoke with the pt and gave directions for zpack  She verbalized understanding  Nothing further needed

## 2016-09-16 NOTE — Telephone Encounter (Signed)
Patient left message with answering service questioning how to take medication - she can be reached at [phone] -pr

## 2016-09-16 NOTE — Telephone Encounter (Signed)
Patient returned phone call..patient contact # [phone].Marland KitchenMearl [name]

## 2016-09-24 ENCOUNTER — Encounter: Payer: Self-pay | Admitting: Internal Medicine

## 2016-09-24 ENCOUNTER — Ambulatory Visit (INDEPENDENT_AMBULATORY_CARE_PROVIDER_SITE_OTHER): Payer: Medicare Other | Admitting: Internal Medicine

## 2016-09-24 ENCOUNTER — Ambulatory Visit (INDEPENDENT_AMBULATORY_CARE_PROVIDER_SITE_OTHER)
Admission: RE | Admit: 2016-09-24 | Discharge: 2016-09-24 | Disposition: A | Discharge status: Home / Self Care | Payer: Medicare Other | Source: Ambulatory Visit | Attending: Internal Medicine | Admitting: Internal Medicine

## 2016-09-24 VITALS — BP 132/80 | HR 72 | Temp 98.2°F | Ht 62.0 in | Wt 99.2 lb

## 2016-09-24 DIAGNOSIS — F17210 Nicotine dependence, cigarettes, uncomplicated: Secondary | ICD-10-CM | POA: Diagnosis not present

## 2016-09-24 DIAGNOSIS — J441 Chronic obstructive pulmonary disease with (acute) exacerbation: Secondary | ICD-10-CM

## 2016-09-24 DIAGNOSIS — J449 Chronic obstructive pulmonary disease, unspecified: Secondary | ICD-10-CM

## 2016-09-24 MED ORDER — AMOXICILLIN-POT CLAVULANATE 875-125 MG PO TABS: 1.0000 | ORAL_TABLET | Freq: Two times a day (BID) | ORAL | 0 refills | Status: AC

## 2016-09-24 MED ORDER — TIOTROPIUM BROMIDE MONOHYDRATE 2.5 MCG/ACT IN AERS: INHALATION_SPRAY | RESPIRATORY_TRACT | 0 refills | Status: DC

## 2016-09-24 MED ORDER — PREDNISONE 10 MG PO TABS: ORAL_TABLET | ORAL | 0 refills | Status: DC

## 2016-09-24 NOTE — Patient Instructions (Addendum)
Augmentin 875 mg take one pill twice daily  X 10 days - take at breakfast and supper with large glass of water.  It would help reduce the usual side effects (diarrhea and yeast infections) if you ate cultured yogurt at lunch.   Prednisone 10 mg take  4 each am x 2 days,  2 each am x 2 days,  1 each am x 2 days and stop   Please remember to go to the   x-ray department downstairs in the basement  for your tests - we will call you with the results when they are available.     The key is to stop smoking completely before smoking completely stops you!   Please schedule a follow up visit in 3 months but call sooner if needed

## 2016-09-24 NOTE — Progress Notes (Signed)
Subjective:    Patient ID: [name], female    DOB: [birth year]   MRN: [id number]  Brief patient profile:  56 yowf active smoker followed in pulmonary clinic for GOLD III copd     History of Present Illness  11/05/2015  f/u ov/Nimco Bivens re: GOLD III / still smoking / maint rx symb/spriiva dpi  Chief Complaint  Patient presents with  . Follow-up    pt states breathing is baseline. c/o cont SOB, prod cough tan to greenish in color, wheezing, cp/tightness  dyspnea is variable and has portable tanks but never uses them to see if makes any difference in her variable doe / using saba up to 4 x daily instead.   rec You are qualified for portable 02 if you wish but no need to wear it at rest or walking around the house - continue it at bedtime thru the concentrator at 2lpm and wear the portable system as needed during the day Ok to finish up the powder spiriva and just refill the spray    02/05/2016  f/u ov/Lennyx Verdell re:  GOLD III/ still smoking/ symb/spiriva / 02 2lpm hs only  Chief Complaint  Patient presents with  . Follow-up    pt states her SOB overall is unchanged since last OV in 10/2015. Pt c/o slight increase in cough with thick white to yellow mucus and occassional chest tightness with activity and heat.    using saba late afternoon not noct or in am   Doe = MMRC2  rec For cough use mucinex or mucinex dm up to 1200 mg every 12 hours and flutter valve as much possible  The key is to stop smoking completely before smoking completely stops you!    05/14/2016  f/u ov/Mia Winthrop re: copd III / still smoking/ symb/ spiriva 02 2lpm hs only and saba maybe once in 24 h  Chief Complaint  Patient presents with  . Follow-up    Cough is unchanged and her breathing is about the same. She has good days and bad. Cough is prod with clear sputum.   MMRC2 = can't walk a nl pace on a flat grade s sob but does fine slow and flat eg shopping ok, not wearing 02    occ severe am cough/ not using flutter / not  sure it's working  rec No change rx x no smoking     09/24/2016 acute extended ov/Louie Meaders re: aecopd, maint rx spriva and symb  Chief Complaint  Patient presents with  . Acute Visit    Pt c/o increased SOB, cough and congestion x 3 wks. She is coughing up large amounts of green sputum. She took a zpack and finished this 6 days ago,     onset was acute  x 3 weeks assoc with nasal congestion and still has green mucus  Min better p zpak  And marked increase over baseline saba need both hfa and neb/ has flutter not using    No obvious day to day or daytime variability or assoc   mucus plugs or hemoptysis or cp or chest tightness, subjective wheeze or overt sinus or hb symptoms. No unusual exp hx or h/o childhood pna/ asthma or knowledge of premature birth.  Sleeping ok without nocturnal  or early am exacerbation  of respiratory  c/o's or need for noct saba. Also denies any obvious fluctuation of symptoms with weather or environmental changes or other aggravating or alleviating factors except as outlined above   Current Medications, Allergies, Complete Past  Medical History, Past Surgical History, Family History, and Social History were reviewed in [employer] record.  ROS  The following are not active complaints unless bolded sore throat, dysphagia, dental problems, itching, sneezing,  nasal congestion or excess/ purulent secretions, ear ache,   fever, chills, sweats, unintended wt loss, classically pleuritic or exertional cp,  orthopnea pnd or leg swelling, presyncope, palpitations, abdominal pain, anorexia, nausea, vomiting, diarrhea  or change in bowel or bladder habits, change in stools or urine, dysuria,hematuria,  rash, arthralgias, visual complaints, headache, numbness, weakness or ataxia or problems with walking or coordination,  change in mood/affect or memory.                  Objective:   Physical Exam   Chronically ill amb nad with rattling/ congested  cough on fvc maneuver     vital signs reviewed - Note on arrival 02 sats  94% on RA     09/26/2013  101>102 10/11/2013 > 01/16/2014  99 > 04/06/2014  103 >105 06/30/2014 > 08/11/2014  101 > 11/09/2014  104 > 01/22/2015 106  > 02/06/2015 105 > 03/08/2015 106 > 04/18/2015  106 > 06/15/2015 103 > 09/14/2015   102 > 10/08/2015   103 > 11/05/2015  102 > 02/05/2016  103 > 05/14/2016 102 > 09/24/2016  99   HEENT mild turbinate edema.  Oropharynx no thrush or excess pnd or cobblestoning.  No JVD or cervical adenopathy. Mild accessory muscle hypertrophy. Trachea midline, nl thryroid. Chest was hyperinflated by percussion with diminished breath sounds,  distant exp rhonchi bilaterally/ cough at end  exp  . Regular rate and rhythm without murmur gallop or rub or increase P2 or edema.  Abd: no hsm, nl excursion. Ext warm without cyanosis or clubbing.  MS  Nl rom/ no deformities    CXR PA and Lateral:   09/24/2016 :    I personally reviewed images and agree with radiology impression as follows:    Stable chronic lung disease with emphysema. No acute cardiopulmonary abnormality.    Assessment & Plan:

## 2016-09-25 ENCOUNTER — Telehealth: Payer: Self-pay | Admitting: Internal Medicine

## 2016-09-25 NOTE — Telephone Encounter (Signed)
Notes recorded by Tanda Rockers, MD on 09/24/2016 at 5:31 PM EDT Call pt: Reviewed cxr and no acute change so no change in recommendations made at [facility] -----------  Spoke with pt, aware of results/recs.  Nothing further needed.

## 2016-09-25 NOTE — Progress Notes (Signed)
LMTCB

## 2016-09-25 NOTE — Assessment & Plan Note (Signed)
>   3 min  Matter of life or breath in this setting/ reviewed but not ready to commit to quit completely- "cutting down"  Was the best she says she can do

## 2016-11-07 ENCOUNTER — Telehealth: Payer: Self-pay | Admitting: Internal Medicine

## 2016-11-07 MED ORDER — TIOTROPIUM BROMIDE MONOHYDRATE 2.5 MCG/ACT IN AERS: 2.0000 | INHALATION_SPRAY | Freq: Every day | RESPIRATORY_TRACT | 0 refills | Status: DC

## 2016-11-07 MED ORDER — BUDESONIDE-FORMOTEROL FUMARATE 160-4.5 MCG/ACT IN AERO: 2.0000 | INHALATION_SPRAY | Freq: Two times a day (BID) | RESPIRATORY_TRACT | 0 refills | Status: DC

## 2016-11-07 NOTE — Telephone Encounter (Signed)
Pt returning call.[name] ° °

## 2016-11-07 NOTE — Telephone Encounter (Signed)
Patient calling for samples of Symbicort 160 and Spiriva. Will place samples up front for patient to pick up. She states she will pick up samples next Tuesday. Nothing else was needed at time of call.

## 2016-11-07 NOTE — Telephone Encounter (Signed)
Left message for patient to call back.

## 2016-11-11 ENCOUNTER — Ambulatory Visit: Payer: Medicare Other | Admitting: Internal Medicine

## 2016-11-18 ENCOUNTER — Telehealth: Payer: Self-pay | Admitting: Internal Medicine

## 2016-11-18 MED ORDER — AMOXICILLIN-POT CLAVULANATE 875-125 MG PO TABS: 1.0000 | ORAL_TABLET | Freq: Two times a day (BID) | ORAL | 0 refills | Status: DC

## 2016-11-18 NOTE — Telephone Encounter (Signed)
Augmentin 875 mg take one pill twice daily  X 10 days - take at breakfast and supper with large glass of water.  It would help reduce the usual side effects (diarrhea and yeast infections) if you ate cultured yogurt at lunch.

## 2016-11-18 NOTE — Telephone Encounter (Signed)
Pt aware that Augmentin has been called in to [facility] Nothing further needed.

## 2016-11-18 NOTE — Telephone Encounter (Signed)
Pt calling with symptoms of cough with [name] mucus, chest tightness and SOB x 4 days.  Pt not using OTC meds. Using inhalers as directed.   Please advise Dr Melvyn Novas. Thanks.   [facility] [address]  Patient Instructions  09/24/16 Augmentin 875 mg take one pill twice daily  X 10 days - take at breakfast and supper with large glass of water.  It would help reduce the usual side effects (diarrhea and yeast infections) if you ate cultured yogurt at lunch.   Prednisone 10 mg take  4 each am x 2 days,  2 each am x 2 days,  1 each am x 2 days and stop   Please remember to go to the   x-ray department downstairs in the basement  for your tests - we will call you with the results when they are available.     The key is to stop smoking completely before smoking completely stops you!   Please schedule a follow up visit in 3 months but call sooner if needed

## 2016-12-24 ENCOUNTER — Encounter: Payer: Self-pay | Admitting: Internal Medicine

## 2016-12-24 ENCOUNTER — Ambulatory Visit (INDEPENDENT_AMBULATORY_CARE_PROVIDER_SITE_OTHER): Payer: Medicare Other | Admitting: Internal Medicine

## 2016-12-24 VITALS — BP 124/70 | HR 88 | Ht 62.0 in | Wt 98.8 lb

## 2016-12-24 DIAGNOSIS — J449 Chronic obstructive pulmonary disease, unspecified: Secondary | ICD-10-CM

## 2016-12-24 DIAGNOSIS — J9611 Chronic respiratory failure with hypoxia: Secondary | ICD-10-CM | POA: Diagnosis not present

## 2016-12-24 DIAGNOSIS — F17210 Nicotine dependence, cigarettes, uncomplicated: Secondary | ICD-10-CM

## 2016-12-24 MED ORDER — TIOTROPIUM BROMIDE MONOHYDRATE 2.5 MCG/ACT IN AERS: 2.0000 | INHALATION_SPRAY | Freq: Every day | RESPIRATORY_TRACT | 0 refills | Status: DC

## 2016-12-24 MED ORDER — BUDESONIDE-FORMOTEROL FUMARATE 160-4.5 MCG/ACT IN AERO: 2.0000 | INHALATION_SPRAY | Freq: Two times a day (BID) | RESPIRATORY_TRACT | 0 refills | Status: DC

## 2016-12-24 NOTE — Patient Instructions (Signed)
The key is to stop smoking completely before smoking completely stops you!   No change in medications   Please schedule a follow up visit in 3 months but call sooner if needed

## 2016-12-24 NOTE — Progress Notes (Signed)
Subjective:    Patient ID: [name], female    DOB: 1946-02-11   MRN: [id number]  Brief patient profile:  89 yowf active smoker followed in pulmonary clinic for GOLD III copd     History of Present Illness  11/05/2015  f/u ov/Fredericka Bottcher re: GOLD III / still smoking / maint rx symb/spriiva dpi  Chief Complaint  Patient presents with  . Follow-up    pt states breathing is baseline. c/o cont SOB, prod cough tan to greenish in color, wheezing, cp/tightness  dyspnea is variable and has portable tanks but never uses them to see if makes any difference in her variable doe / using saba up to 4 x daily instead.   rec You are qualified for portable 02 if you wish but no need to wear it at rest or walking around the house - continue it at bedtime thru the concentrator at 2lpm and wear the portable system as needed during the day Ok to finish up the powder spiriva and just refill the respimat    12/24/2016  f/u ov/Tino Ronan re:  COPD GOLD III/ still smoking/ symb/spiriva / 02 2lpm hs only  Chief Complaint  Patient presents with  . Follow-up    Pt states she is still having increase sob with exerton, she still has a slight cough with clear mucus with a tint of blood, lots of wheezing,Denies chest tightness,fever   using saba hfa up to twice daily but no needing noct Doe = MMRC3 = can't walk 100 yards even at a slow pace at a flat grade s stopping due to sob   Has slt bloody nasal discharge in am and scant blood streaked sputum also, just in am and just when nose is bleeding too  No obvious day to day or daytime variability or assoc   purulent sputum or mucus plugs  or cp or chest tightness, subjective wheeze or overt  hb symptoms. No unusual exp hx or h/o childhood pna/ asthma or knowledge of premature birth.  Sleeping ok without nocturnal  or early am exacerbation  of respiratory  c/o's or need for noct saba. Also denies any obvious fluctuation of symptoms with weather or environmental changes or  other aggravating or alleviating factors except as outlined above   Current Medications, Allergies, Complete Past Medical History, Past Surgical History, Family History, and Social History were reviewed in [employer] record.  ROS  The following are not active complaints unless bolded sore throat, dysphagia, dental problems, itching, sneezing,  nasal congestion or excess/ purulent secretions, ear ache,   fever, chills, sweats, unintended wt loss, classically pleuritic or exertional cp,  orthopnea pnd or leg swelling, presyncope, palpitations, abdominal pain, anorexia, nausea, vomiting, diarrhea  or change in bowel or bladder habits, change in stools or urine, dysuria,hematuria,  rash, arthralgias, visual complaints, headache, numbness, weakness or ataxia or problems with walking or coordination,  change in mood/affect or memory.                    Objective:   Physical Exam   Chronically ill amb nad with less rattling/ congested cough on fvc maneuver than previous       vital signs reviewed - Note on arrival 02 sats  92% on RA     09/26/2013  101>102 10/11/2013 > 01/16/2014  99 > 04/06/2014  103 >105 06/30/2014 > 08/11/2014  101 > 11/09/2014  104 > 01/22/2015 106  > 02/06/2015 105 > 03/08/2015 106 > 04/18/2015  106 > 06/15/2015 103 > 09/14/2015   102 > 10/08/2015   103 > 11/05/2015  102 > 02/05/2016  103 > 05/14/2016 102 > 09/24/2016  99 > 12/24/2016  99  HEENT mild turbinate edema.  Oropharynx no thrush or excess pnd or cobblestoning.  No JVD or cervical adenopathy. Mild accessory muscle hypertrophy. Trachea midline, nl thryroid. Chest was hyperinflated by percussion with diminished breath sounds,  Min mid/late exp rhonchi bilaterally/ cough at end  exp  . Regular rate and rhythm without murmur gallop or rub or increase P2 or edema.  Abd: no hsm, nl excursion. Ext warm without cyanosis or clubbing.  MS  Nl rom/ no deformities    CXR PA and Lateral:   09/24/2016 :    I personally  reviewed images and agree with radiology impression as follows:    Stable chronic lung disease with emphysema. No acute cardiopulmonary abnormality.    Assessment & Plan:

## 2016-12-25 ENCOUNTER — Encounter: Payer: Self-pay | Admitting: Internal Medicine

## 2016-12-25 NOTE — Assessment & Plan Note (Signed)
Exertional desats 06/30/2014 >O2 2lm rx with act  - 11/09/2014  Walked RA  2 laps @ 185 ft each stopped due to  Sat down to 88 % at nl pace but no sob  - ono RA  09/22/15  desat < 89% x 1:38mn > rec continue 02 2lpm - 47/99/8721certification: Patient Saturations on Room Air at Rest = 94% and while Ambulating 2 laps x 185 each= 87% Patient Saturations on 2Liters of oxygen while Ambulating =96% x one additional lap - 11/05/2015  Walked RA x 4 laps @ 185 ft each stopped due to  desat to 87% resolved on 2lpm    rx as of 12/24/2016 use 02 with exercise as needed  but not adls but always 2lpm hs

## 2016-12-25 NOTE — Assessment & Plan Note (Signed)
>  3 m I took an extended  opportunity with this patient to again outline the consequences of continued cigarette use  in airway disorders based on all the data we have from the multiple national lung health studies (perfomed over decades at millions of dollars in cost)  indicating that smoking cessation, not choice of inhalers or physicians, is the most important aspect of care.

## 2016-12-25 NOTE — Assessment & Plan Note (Signed)
pfts 11/2011   FEV1 0.86 (42%) ratio 35  -- 09/14/2015  hfa [name] 11/05/2015 (when spiriva dpi supply runs out) - flutter valve added 02/05/2016  And recoached 05/14/2016  - 05/14/2016  After extensive coaching HFA effectiveness =    75%   Severe but relatively well compensated -  Group D in terms of symptom/risk and laba/lama/ICS  therefore appropriate rx at this point so continue rx as is  Each maintenance medication was reviewed in detail including most importantly the difference between maintenance and as needed and under what circumstances the prns are to be used.  Please see AVS for specific  Instructions which are unique to this visit and I personally typed out  which were reviewed in detail in writing with the patient and a copy provided.

## 2017-02-03 ENCOUNTER — Other Ambulatory Visit (HOSPITAL_COMMUNITY): Payer: Self-pay | Admitting: *Deleted

## 2017-02-04 ENCOUNTER — Ambulatory Visit (HOSPITAL_COMMUNITY)
Admission: RE | Admit: 2017-02-04 | Discharge: 2017-02-04 | Disposition: A | Discharge status: Home / Self Care | Payer: Medicare Other | Source: Ambulatory Visit | Attending: Sports Medicine | Admitting: Sports Medicine

## 2017-02-04 DIAGNOSIS — M810 Age-related osteoporosis without current pathological fracture: Secondary | ICD-10-CM | POA: Insufficient documentation

## 2017-02-04 MED ORDER — ZOLEDRONIC ACID 5 MG/100ML IV SOLN
INTRAVENOUS | Status: AC
  Administered 2017-02-04: 13:00:00 5 mg via INTRAVENOUS
  Filled 2017-02-04: qty 100

## 2017-02-04 MED ORDER — ZOLEDRONIC ACID 5 MG/100ML IV SOLN
5.0000 mg | Freq: Once | INTRAVENOUS | Status: AC
  Administered 2017-02-04: 5 mg via INTRAVENOUS

## 2017-03-27 ENCOUNTER — Encounter: Payer: Self-pay | Admitting: Internal Medicine

## 2017-03-27 ENCOUNTER — Ambulatory Visit (INDEPENDENT_AMBULATORY_CARE_PROVIDER_SITE_OTHER): Payer: Medicare Other | Admitting: Internal Medicine

## 2017-03-27 VITALS — BP 112/68 | HR 88 | Ht 62.0 in | Wt 99.0 lb

## 2017-03-27 DIAGNOSIS — F17210 Nicotine dependence, cigarettes, uncomplicated: Secondary | ICD-10-CM

## 2017-03-27 DIAGNOSIS — J9611 Chronic respiratory failure with hypoxia: Secondary | ICD-10-CM | POA: Diagnosis not present

## 2017-03-27 DIAGNOSIS — J449 Chronic obstructive pulmonary disease, unspecified: Secondary | ICD-10-CM | POA: Diagnosis not present

## 2017-03-27 MED ORDER — TIOTROPIUM BROMIDE MONOHYDRATE 2.5 MCG/ACT IN AERS: 1.0000 | INHALATION_SPRAY | Freq: Every day | RESPIRATORY_TRACT | 0 refills | Status: DC

## 2017-03-27 MED ORDER — BUDESONIDE-FORMOTEROL FUMARATE 160-4.5 MCG/ACT IN AERO: 2.0000 | INHALATION_SPRAY | Freq: Every day | RESPIRATORY_TRACT | 6 refills | Status: DC

## 2017-03-27 MED ORDER — ALBUTEROL SULFATE HFA 108 (90 BASE) MCG/ACT IN AERS: INHALATION_SPRAY | RESPIRATORY_TRACT | 11 refills | Status: DC

## 2017-03-27 NOTE — Progress Notes (Signed)
Subjective:    Patient ID: [name], female    DOB: Jan 22, 1946   MRN: [id number]  Brief patient profile:  11 yowf active smoker followed in pulmonary clinic for GOLD III copd     History of Present Illness  11/05/2015  f/u ov/[name] re: GOLD III / still smoking / maint rx symb/spriiva dpi  Chief Complaint  Patient presents with  . Follow-up    pt states breathing is baseline. c/o cont SOB, prod cough tan to greenish in color, wheezing, cp/tightness  dyspnea is variable and has portable tanks but never uses them to see if makes any difference in her variable doe / using saba up to 4 x daily instead.   rec You are qualified for portable 02 if you wish but no need to wear it at rest or walking around the house - continue it at bedtime thru the concentrator at 2lpm and wear the portable system as needed during the day Ok to finish up the powder spiriva and just refill the respimat    12/24/2016  f/u ov/[name] re:  COPD GOLD III/ still smoking/ symb/spiriva / 02 2lpm hs only  Chief Complaint  Patient presents with  . Follow-up    Pt states she is still having increase sob with exerton, she still has a slight cough with clear mucus with a tint of blood, lots of wheezing,Denies chest tightness,fever   using saba hfa up to twice daily but no needing noct Doe = MMRC3 = can't walk 100 yards even at a slow pace at a flat grade s stopping due to sob   Has slt bloody nasal discharge in am and scant blood streaked sputum also, just in am and just when nose is bleeding too   03/27/2017  f/u ov/[name] re:  Copd III / 02 hs 2lpm / never using amb 02  Doe = MMRC 3 still off 02  Sleeping on 2lpm with typical gurgling cough in am > sev tsp of thick white mucus/ occ streaks of blood  No obvious day to day or daytime variability or assoc excess/ purulent sputum or mucus plugs or hemoptysis or cp or chest tightness, subjective wheeze or overt sinus or hb symptoms. No unusual exp hx or h/o childhood pna/  asthma or knowledge of premature birth.  Sleeping ok flat without nocturnal  or early am exacerbation  of respiratory  c/o's or need for noct saba. Also denies any obvious fluctuation of symptoms with weather or environmental changes or other aggravating or alleviating factors except as outlined above   Current Allergies, Complete Past Medical History, Past Surgical History, Family History, and Social History were reviewed in [employer] record.  ROS  The following are not active complaints unless bolded Hoarseness, sore throat, dysphagia, dental problems, itching, sneezing,  nasal congestion or discharge of excess mucus or purulent secretions, ear ache,   fever, chills, sweats, unintended wt loss or wt gain, classically pleuritic or exertional cp,  orthopnea pnd or leg swelling, presyncope, palpitations, abdominal pain, anorexia, nausea, vomiting, diarrhea  or change in bowel habits or change in bladder habits, change in stools or change in urine, dysuria, hematuria,  rash, arthralgias, visual complaints, headache, numbness, weakness or ataxia or problems with walking or coordination,  change in mood/affect or memory.        Current Meds  Medication Sig  . albuterol (PROVENTIL) (2.5 MG/3ML) 0.083% nebulizer solution Take 3 mLs (2.5 mg total) by nebulization every 6 (six) hours as  needed for wheezing or shortness of breath.  . budesonide-formoterol (SYMBICORT) 160-4.5 MCG/ACT inhaler Inhale 2 puffs into the lungs 2 (two) times daily.  Marland Kitchen CARTIA XT 300 MG 24 hr capsule Take 300 mg by mouth daily.  . cephALEXin (KEFLEX) 500 MG capsule Take 500 mg by mouth 3 (three) times daily.  . cholecalciferol (VITAMIN D) 1000 UNITS tablet Take 1,000 Units by mouth daily.  Marland Kitchen losartan (COZAAR) 100 MG tablet Take 100 mg by mouth daily.  . Multiple Minerals (CALCIUM-MAGNESIUM-ZINC) TABS Take 1 tablet by mouth daily.  . ondansetron (ZOFRAN) 4 MG tablet Take 1 tablet (4 mg total) by mouth every 8  (eight) hours as needed for nausea or vomiting.  . OXYGEN Inhale 2 L into the lungs at bedtime.  Marland Kitchen Respiratory Therapy Supplies (FLUTTER) DEVI Use as directed  . Tiotropium Bromide Monohydrate (SPIRIVA RESPIMAT) 2.5 MCG/ACT AERS inhale 2 puffs INTO THE LUNGS every morning  . [DISCONTINUED] VENTOLIN HFA 108 (90 BASE) MCG/ACT inhaler inhale 2 puffs by mouth every 4 hours if needed for wheezing or shortness of breath  (USE ONLY IF YOU CAN'T CATCH YOUR BREATH)                    Objective:   Physical Exam   Chronically ill amb nad with   Junky/rattling/ congested cough on fvc maneuver        vital signs reviewed - Note on arrival 02 sats  94% on RA     09/26/2013  101>102 10/11/2013 > 01/16/2014  99 > 04/06/2014  103 >105 06/30/2014 > 08/11/2014  101 > 11/09/2014  104 > 01/22/2015 106  > 02/06/2015 105 > 03/08/2015 106 > 04/18/2015  106 > 06/15/2015 103 > 09/14/2015   102 > 10/08/2015   103 > 11/05/2015  102 > 02/05/2016  103 > 05/14/2016 102 > 09/24/2016  99 > 12/24/2016  99  HEENT mild turbinate edema.  Top denture/ bottom partial Oropharynx no thrush or excess pnd or cobblestoning.  No JVD or cervical adenopathy. Mild accessory muscle hypertrophy. Trachea midline, nl thryroid. Chest was hyperinflated by percussion with diminished breath sounds,  Min mid   exp rhonchi bilaterally/ cough at end  Exp better with plm  . Regular rate and rhythm without murmur gallop or rub or increase P2 or edema.  Abd: no hsm, nl excursion. Ext warm without cyanosis or clubbing.  MS  Nl rom/ no deformities    CXR PA and Lateral:   09/24/2016 :    I personally reviewed images and agree with radiology impression as follows:    Stable chronic lung disease with emphysema. No acute cardiopulmonary abnormality.    Assessment & Plan:

## 2017-03-27 NOTE — Assessment & Plan Note (Addendum)
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 largely due to continued smoking against medical advice  (see separate a/p)  And nothing really more we can offer at this point in terms of med as  Group D in terms of symptom/risk and laba/lama/ICS  therefore appropriate rx at this point = symb 160/spiriva  Will return for pfts/ cxr in 3 m, no change rx in meantime, encouraged to use fltter as instructed esp in am   I had an extended discussion with the patient reviewing all relevant studies completed to date and  lasting 15 to 20 minutes of a 25 minute visit    Each maintenance medication was reviewed in detail including most importantly the difference between maintenance and prns and under what circumstances the prns are to be triggered using an action plan format that is not reflected in the computer generated alphabetically organized AVS.    Please see AVS for specific instructions unique to this visit that I personally wrote and verbalized to the the pt in detail and then reviewed with pt  by my nurse highlighting any  changes in therapy recommended at today's visit to their plan of care.

## 2017-03-27 NOTE — Patient Instructions (Addendum)
Work on inhaler technique:  relax and gently blow all the way out then take a nice smooth deep breath back in, triggering the inhaler at same time you start breathing in.  Hold for up to 5 seconds if you can. Blow out thru nose. Rinse and gargle with water when done     Change the ventolin to proair up to 2 pffs every 4 hours as needed    Please schedule a follow up visit in 3 months but call sooner if needed with pfts

## 2017-03-29 NOTE — Assessment & Plan Note (Addendum)
Exertional desats 06/30/2014 >O2 2lm rx with act  - 11/09/2014  Walked RA  2 laps @ 185 ft each stopped due to  Sat down to 88 % at nl pace but no sob  - ono RA  09/22/15  desat < 89% x 1:11mn > rec continue 02 2lpm - 41/16/4353certification: Patient Saturations on Room Air at Rest = 94% and while Ambulating 2 laps x 185 each= 87% Patient Saturations on 2Liters of oxygen while Ambulating =96% x one additional lap - 11/05/2015  Walked RA x 4 laps @ 185 ft each stopped due to  desat to 87% resolved on 2lpm    rx as of 03/27/2017   2lpm hs and prn with activity

## 2017-03-29 NOTE — Assessment & Plan Note (Signed)
>   3 min  Matter of life or breath reviewed, not willing to commit to quit at this point

## 2017-06-16 DIAGNOSIS — N390 Urinary tract infection, site not specified: Secondary | ICD-10-CM

## 2017-06-16 HISTORY — DX: Urinary tract infection, site not specified: N39.0

## 2017-06-26 ENCOUNTER — Other Ambulatory Visit: Payer: Self-pay | Admitting: Internal Medicine

## 2017-06-26 DIAGNOSIS — R0600 Dyspnea, unspecified: Secondary | ICD-10-CM

## 2017-06-29 ENCOUNTER — Ambulatory Visit (INDEPENDENT_AMBULATORY_CARE_PROVIDER_SITE_OTHER): Payer: Medicare Other | Admitting: Internal Medicine

## 2017-06-29 ENCOUNTER — Encounter: Payer: Self-pay | Admitting: Internal Medicine

## 2017-06-29 VITALS — BP 112/72 | HR 74 | Ht 62.5 in | Wt 104.0 lb

## 2017-06-29 DIAGNOSIS — J9611 Chronic respiratory failure with hypoxia: Secondary | ICD-10-CM | POA: Diagnosis not present

## 2017-06-29 DIAGNOSIS — I2781 Cor pulmonale (chronic): Secondary | ICD-10-CM | POA: Diagnosis not present

## 2017-06-29 DIAGNOSIS — R0600 Dyspnea, unspecified: Secondary | ICD-10-CM | POA: Diagnosis not present

## 2017-06-29 DIAGNOSIS — J449 Chronic obstructive pulmonary disease, unspecified: Secondary | ICD-10-CM | POA: Diagnosis not present

## 2017-06-29 LAB — PULMONARY FUNCTION TEST
DL/VA % PRED: 35 %
DL/VA: 1.62 ml/min/mmHg/L
DLCO UNC % PRED: 33 %
DLCO cor % pred: 33 %
DLCO cor: 7.52 ml/min/mmHg
DLCO unc: 7.48 ml/min/mmHg
FEF 25-75 PRE: 0.33 L/s
FEF 25-75 Post: 0.42 L/sec
FEF2575-%Change-Post: 26 %
FEF2575-%PRED-POST: 23 %
FEF2575-%Pred-Pre: 18 %
FEV1-%Change-Post: 11 %
FEV1-%Pred-Post: 44 %
FEV1-%Pred-Pre: 39 %
FEV1-PRE: 0.83 L
FEV1-Post: 0.92 L
FEV1FVC-%Change-Post: 3 %
FEV1FVC-%PRED-PRE: 55 %
FEV6-%Change-Post: 7 %
FEV6-%PRED-POST: 75 %
FEV6-%Pred-Pre: 70 %
FEV6-PRE: 1.86 L
FEV6-Post: 2 L
FEV6FVC-%CHANGE-POST: 0 %
FEV6FVC-%PRED-PRE: 100 %
FEV6FVC-%Pred-Post: 99 %
FVC-%Change-Post: 7 %
FVC-%Pred-Post: 76 %
FVC-%Pred-Pre: 70 %
FVC-Post: 2.11 L
FVC-Pre: 1.96 L
POST FEV1/FVC RATIO: 44 %
Post FEV6/FVC ratio: 95 %
Pre FEV1/FVC ratio: 42 %
Pre FEV6/FVC Ratio: 95 %

## 2017-06-29 MED ORDER — TIOTROPIUM BROMIDE MONOHYDRATE 2.5 MCG/ACT IN AERS: 1.0000 | INHALATION_SPRAY | Freq: Every day | RESPIRATORY_TRACT | 0 refills | Status: DC

## 2017-06-29 MED ORDER — BUDESONIDE-FORMOTEROL FUMARATE 160-4.5 MCG/ACT IN AERO: 2.0000 | INHALATION_SPRAY | Freq: Every day | RESPIRATORY_TRACT | 0 refills | Status: DC

## 2017-06-29 NOTE — Progress Notes (Signed)
PFT done today. 

## 2017-06-29 NOTE — Assessment & Plan Note (Signed)
Exertional desats 06/30/2014 >O2 2lm rx with act  - 11/09/2014  Walked RA  2 laps @ 185 ft each stopped due to  Sat down to 88 % at nl pace but no sob  - ono RA  09/22/15  desat < 89% x 1:55mn > rec continue 02 2lpm - 46/46/8032certification: Patient Saturations on Room Air at Rest = 94% and while Ambulating 2 laps x 185 each= 87% Patient Saturations on 2Liters of oxygen while Ambulating =96% x one additional lap - 11/05/2015  Walked RA x 4 laps @ 185 ft each stopped due to  desat to 87% resolved on 2lpm   - 06/29/2017  Walked RA x 3 laps @ 185 ft each stopped due to  End of study/ slow pace, no desat  rec 06/29/2017 = 2lpm hs and prn with ex

## 2017-06-29 NOTE — Patient Instructions (Signed)
Work on inhaler technique:  relax and gently blow all the way out then take a nice smooth deep breath back in, triggering the inhaler at same time you start breathing in.  Hold for up to 5 seconds if you can. Blow out thru nose. Rinse and gargle with water when done   No change in medications   02 is 2lpm at bedtime   Talk to Dr Terrence Dupont re your water pill   Please schedule a follow up visit in 3 months but call sooner if needed  with all medications /inhalers/ solutions in hand so we can verify exactly what you are taking. This includes all medications from all doctors and over the counters

## 2017-06-29 NOTE — Progress Notes (Addendum)
Subjective:   Patient ID: [name], female    DOB: [birth year]   MRN: [id number]  Brief patient profile:  77 yowf quit smoking 03/2017  followed in pulmonary clinic for GOLD III copd   History of Present Illness  11/05/2015  f/u ov/[name] re: GOLD III / still smoking / maint rx symb/spriiva dpi  Chief Complaint  Patient presents with  . Follow-up    pt states breathing is baseline. c/o cont SOB, prod cough tan to greenish in color, wheezing, cp/tightness  dyspnea is variable and has portable tanks but never uses them to see if makes any difference in her variable doe / using saba up to 4 x daily instead.   rec You are qualified for portable 02 if you wish but no need to wear it at rest or walking around the house - continue it at bedtime thru the concentrator at 2lpm and wear the portable system as needed during the day Ok to finish up the powder spiriva and just refill the respimat    12/24/2016  f/u ov/[name] re:  COPD GOLD III/ still smoking/ symb/spiriva / 02 2lpm hs only  Chief Complaint  Patient presents with  . Follow-up    Pt states she is still having increase sob with exerton, she still has a slight cough with clear mucus with a tint of blood, lots of wheezing,Denies chest tightness,fever   using saba hfa up to twice daily but no needing noct Doe = MMRC3 = can't walk 100 yards even at a slow pace at a flat grade s stopping due to sob   Has slt bloody nasal discharge in am and scant blood streaked sputum also, just in am and just when nose is bleeding too   03/27/2017  f/u ov/[name] re:  Copd III / 02 hs 2lpm / never using amb 02  Doe = MMRC 3 still off 02  Sleeping on 2lpm with typical gurgling cough in am > sev tsp of thick white mucus/ occ streaks of blood rec Work on inhaler technique:  relax and gently blow all the way out then take a nice smooth deep breath back in, triggering the inhaler at same time you start breathing in.  Hold for up to 5 seconds if you can. Blow  out thru nose. Rinse and gargle with water when done Change the ventolin to proair up to 2 pffs every 4 hours as needed     06/29/2017  f/u ov/[name] re:   GOLD III copd/ 02 hs only  Quit smoking 03/2017  Chief Complaint  Patient presents with  . Follow-up    PFT's done today.  Breathing is unchanged. She has been having some swelling in her legs and feet.   doe = MMRC3 = can't walk 100 yards even at a slow pace at a flat grade s stopping due to sob    No obvious day to day or daytime variability or assoc excess/ purulent sputum or mucus plugs or hemoptysis or cp or chest tightness, subjective wheeze or overt sinus or hb symptoms. No unusual exposure hx or h/o childhood pna/ asthma or knowledge of premature birth.  Sleeping ok flat without nocturnal  or early am exacerbation  of respiratory  c/o's or need for noct saba. Also denies any obvious fluctuation of symptoms with weather or environmental changes or other aggravating or alleviating factors except as outlined above   Current Allergies, Complete Past Medical History, Past Surgical History, Family History, and Social History were  reviewed in [facility] record.  ROS  The following are not active complaints unless bolded Hoarseness, sore throat, dysphagia, dental problems, itching, sneezing,  nasal congestion or discharge of excess mucus or purulent secretions, ear ache,   fever, chills, sweats, unintended wt loss or wt gain, classically pleuritic or exertional cp,  orthopnea pnd or leg swelling, presyncope, palpitations, abdominal pain, anorexia, nausea, vomiting, diarrhea  or change in bowel habits or change in bladder habits, change in stools or change in urine, dysuria, hematuria,  rash, arthralgias, visual complaints, headache, numbness, weakness or ataxia or problems with walking or coordination,  change in mood/affect or memory.        Current Meds  Medication Sig  . albuterol (PROAIR HFA) 108 (90 Base) MCG/ACT  inhaler 2 puffs every 4 hours as needed only  if your can't catch your breath  . albuterol (PROVENTIL) (2.5 MG/3ML) 0.083% nebulizer solution Take 3 mLs (2.5 mg total) by nebulization every 6 (six) hours as needed for wheezing or shortness of breath.  . budesonide-formoterol (SYMBICORT) 160-4.5 MCG/ACT inhaler Inhale 2 puffs into the lungs daily.  Marland Kitchen CARTIA XT 300 MG 24 hr capsule Take 300 mg by mouth daily.  . cholecalciferol (VITAMIN D) 1000 UNITS tablet Take 1,000 Units by mouth daily.  Marland Kitchen losartan (COZAAR) 100 MG tablet Take 100 mg by mouth daily.  . Multiple Minerals (CALCIUM-MAGNESIUM-ZINC) TABS Take 1 tablet by mouth daily.  . ondansetron (ZOFRAN) 4 MG tablet Take 1 tablet (4 mg total) by mouth every 8 (eight) hours as needed for nausea or vomiting.  . OXYGEN Inhale 2 L into the lungs at bedtime.  Marland Kitchen Respiratory Therapy Supplies (FLUTTER) DEVI Use as directed  . Tiotropium Bromide Monohydrate (SPIRIVA RESPIMAT) 2.5 MCG/ACT AERS Inhale 1 Inhaler into the lungs daily.  . [DISCONTINUED] budesonide-formoterol (SYMBICORT) 160-4.5 MCG/ACT inhaler Inhale 2 puffs into the lungs daily.  . [DISCONTINUED] Tiotropium Bromide Monohydrate (SPIRIVA RESPIMAT) 2.5 MCG/ACT AERS Inhale 1 Inhaler into the lungs daily.                        Objective:   Physical Exam   Chronically thin wf with no rattle   Vital signs reviewed - Note on arrival 02 sats  93% on RA    09/26/2013  101>102 10/11/2013 > 01/16/2014  99 > 04/06/2014  103 >105 06/30/2014 > 08/11/2014  101 > 11/09/2014  104 > 01/22/2015 106  > 02/06/2015 105 > 03/08/2015 106 > 04/18/2015  106 > 06/15/2015 103 > 09/14/2015   102 > 10/08/2015   103 > 11/05/2015  102 > 02/05/2016  103 > 05/14/2016 102 > 09/24/2016  99 > 12/24/2016  99 > 06/29/2017  104       HEENT: nl   turbinates bilaterally, and oropharynx. Nl external ear canals without cough reflex/ upper dentures   NECK :  without JVD/Nodes/TM/ nl carotid upstrokes bilaterally   LUNGS: no acc  muscle use,  Barrel contour chest/ hyper resonant to percussion with distant bs s wheeze bilaterally    CV:  RRR  no s3 or murmur or increase in P2, and  1+ ptting lower ext sym lower ext  edema  ABD:  soft and nontender with nl inspiratory excursion in the supine position. No bruits or organomegaly appreciated, bowel sounds nl  MS:  Nl gait/ ext warm without deformities, calf tenderness, cyanosis or clubbing No obvious joint restrictions   SKIN: warm and dry with chronic venous changes  both lower ext   NEURO:  alert, approp, nl sensorium with  no motor or cerebellar deficits apparent.             Assessment & Plan:

## 2017-06-29 NOTE — Assessment & Plan Note (Signed)
C/w Group III PH rx is to treat the underlying problem/ diuretic rx per cards

## 2017-06-29 NOTE — Assessment & Plan Note (Addendum)
pfts 11/2011   FEV1 0.86 (42%) ratio 35  - 09/14/2015  hfa Arlana Pouch 11/05/2015 (when spiriva dpi supply runs out) - flutter valve added 02/05/2016  And recoached 05/14/2016    - PFT's  06/29/2017  FEV1 0.92 (44 % ) ratio 44  p 11 % improvement from saba p symb/spiriva prior to study with DLCO  33/33 % corrects to 35 % for alv volume   - 06/29/2017  After extensive coaching inhaler device  effectiveness =    75%   Despite suboptima hfa she has improved somewhat since last ov likely related to smoking cessation with much less cough/ congestion related to the effects of cigs on mc function   Group D in terms of symptom/risk and laba/lama/ICS  therefore appropriate rx at this point so should continue symb 160 / spiriva smi indef  I had an extended discussion with the patient reviewing all relevant studies completed to date and  lasting 15 to 20 minutes of a 25 minute visit    Each maintenance medication was reviewed in detail including most importantly the difference between maintenance and prns and under what circumstances the prns are to be triggered using an action plan format that is not reflected in the computer generated alphabetically organized AVS.    Please see AVS for specific instructions unique to this visit that I personally wrote and verbalized to the the pt in detail and then reviewed with pt  by my nurse highlighting any  changes in therapy recommended at today's visit to their plan of care.

## 2017-07-04 ENCOUNTER — Other Ambulatory Visit: Payer: Self-pay

## 2017-07-04 ENCOUNTER — Emergency Department (HOSPITAL_COMMUNITY): Payer: Medicare Other

## 2017-07-04 ENCOUNTER — Encounter (HOSPITAL_COMMUNITY): Payer: Self-pay

## 2017-07-04 ENCOUNTER — Emergency Department (HOSPITAL_COMMUNITY)
Admission: EM | Admit: 2017-07-04 | Discharge: 2017-07-04 | Disposition: A | Discharge status: Home / Self Care | Payer: Medicare Other | Attending: Emergency Medicine | Admitting: Emergency Medicine

## 2017-07-04 DIAGNOSIS — R600 Localized edema: Secondary | ICD-10-CM | POA: Insufficient documentation

## 2017-07-04 DIAGNOSIS — Z79899 Other long term (current) drug therapy: Secondary | ICD-10-CM | POA: Diagnosis not present

## 2017-07-04 DIAGNOSIS — I10 Essential (primary) hypertension: Secondary | ICD-10-CM | POA: Insufficient documentation

## 2017-07-04 DIAGNOSIS — L03116 Cellulitis of left lower limb: Secondary | ICD-10-CM

## 2017-07-04 DIAGNOSIS — N3000 Acute cystitis without hematuria: Secondary | ICD-10-CM

## 2017-07-04 DIAGNOSIS — M79605 Pain in left leg: Secondary | ICD-10-CM | POA: Diagnosis present

## 2017-07-04 DIAGNOSIS — J449 Chronic obstructive pulmonary disease, unspecified: Secondary | ICD-10-CM | POA: Insufficient documentation

## 2017-07-04 DIAGNOSIS — Z87891 Personal history of nicotine dependence: Secondary | ICD-10-CM | POA: Diagnosis not present

## 2017-07-04 DIAGNOSIS — L03115 Cellulitis of right lower limb: Secondary | ICD-10-CM | POA: Diagnosis not present

## 2017-07-04 DIAGNOSIS — R609 Edema, unspecified: Secondary | ICD-10-CM

## 2017-07-04 LAB — COMPREHENSIVE METABOLIC PANEL
ALK PHOS: 44 U/L (ref 38–126)
ALT: 15 U/L (ref 14–54)
AST: 19 U/L (ref 15–41)
Albumin: 4.1 g/dL (ref 3.5–5.0)
Anion gap: 7 (ref 5–15)
BUN: 16 mg/dL (ref 6–20)
CHLORIDE: 104 mmol/L (ref 101–111)
CO2: 27 mmol/L (ref 22–32)
Calcium: 9.1 mg/dL (ref 8.9–10.3)
Creatinine, Ser: 0.54 mg/dL (ref 0.44–1.00)
Glucose, Bld: 103 mg/dL — ABNORMAL HIGH (ref 65–99)
Potassium: 4.2 mmol/L (ref 3.5–5.1)
Sodium: 138 mmol/L (ref 135–145)
TOTAL PROTEIN: 7.2 g/dL (ref 6.5–8.1)
Total Bilirubin: 1 mg/dL (ref 0.3–1.2)

## 2017-07-04 LAB — URINALYSIS, ROUTINE W REFLEX MICROSCOPIC
Bilirubin Urine: NEGATIVE
Glucose, UA: NEGATIVE mg/dL
Hgb urine dipstick: NEGATIVE
KETONES UR: 5 mg/dL — AB
Nitrite: POSITIVE — AB
PROTEIN: NEGATIVE mg/dL
Specific Gravity, Urine: 1.017 (ref 1.005–1.030)
pH: 7 (ref 5.0–8.0)

## 2017-07-04 LAB — CBC WITH DIFFERENTIAL/PLATELET
BASOS ABS: 0 10*3/uL (ref 0.0–0.1)
Basophils Relative: 0 %
Eosinophils Absolute: 0.1 10*3/uL (ref 0.0–0.7)
Eosinophils Relative: 1 %
HEMATOCRIT: 43.3 % (ref 36.0–46.0)
Hemoglobin: 14.2 g/dL (ref 12.0–15.0)
LYMPHS PCT: 25 %
Lymphs Abs: 1.9 10*3/uL (ref 0.7–4.0)
MCH: 29.9 pg (ref 26.0–34.0)
MCHC: 32.8 g/dL (ref 30.0–36.0)
MCV: 91.2 fL (ref 78.0–100.0)
Monocytes Absolute: 0.7 10*3/uL (ref 0.1–1.0)
Monocytes Relative: 9 %
NEUTROS ABS: 4.9 10*3/uL (ref 1.7–7.7)
Neutrophils Relative %: 65 %
Platelets: 278 10*3/uL (ref 150–400)
RBC: 4.75 MIL/uL (ref 3.87–5.11)
RDW: 14.4 % (ref 11.5–15.5)
WBC: 7.5 10*3/uL (ref 4.0–10.5)

## 2017-07-04 LAB — I-STAT CG4 LACTIC ACID, ED: LACTIC ACID, VENOUS: 1.13 mmol/L (ref 0.5–1.9)

## 2017-07-04 MED ORDER — CEPHALEXIN 500 MG PO CAPS
500.0000 mg | ORAL_CAPSULE | Freq: Once | ORAL | Status: AC
  Administered 2017-07-04: 500 mg via ORAL
  Filled 2017-07-04: qty 1

## 2017-07-04 MED ORDER — SULFAMETHOXAZOLE-TRIMETHOPRIM 800-160 MG PO TABS: 1.0000 | ORAL_TABLET | Freq: Two times a day (BID) | ORAL | 0 refills | Status: AC

## 2017-07-04 MED ORDER — SULFAMETHOXAZOLE-TRIMETHOPRIM 800-160 MG PO TABS
1.0000 | ORAL_TABLET | Freq: Once | ORAL | Status: AC
  Administered 2017-07-04: 1 via ORAL
  Filled 2017-07-04: qty 1

## 2017-07-04 MED ORDER — CEPHALEXIN 500 MG PO CAPS: 500.0000 mg | ORAL_CAPSULE | Freq: Four times a day (QID) | ORAL | 0 refills | Status: AC

## 2017-07-04 NOTE — ED Triage Notes (Signed)
Pt c/o recurrent skin infection that has not bee resolved with 3 rounds of abx by PCP over the past few months. Left leg is reddened, swollen. Pt states both legs swell and have hx of weeping. Left lower leg has small wounds that do not seem to be healing. Pt taking furosemide. A/Ox4, ambulatory.

## 2017-07-04 NOTE — ED Provider Notes (Signed)
[name] DEPT Provider Note   CSN: [id number] Arrival date & time: 07/04/17  1056     History   Chief Complaint Chief Complaint  Patient presents with  . Rash  . Leg Swelling  . Leg Pain    HPI [name] is a 72 y.o. female with history of COPD, HTN, chronic cor pulmonale presents today with chief complaint acute onset, waxing and waning swelling and redness of the bilateral lower extremities for several months.  Began initially in October 2018 for which her primary care physician put her on Keflex.  Her symptoms improved but then returned and patient has now been on antibiotics multiple times including Bactrim most recently.  Also takes Lasix as needed for swelling, has not had any recently.  She states that she had a number of the skin tags removed from her bilateral lower extremities 1 month ago and since then has been having worsening redness, swelling, and drainage from her lower extremities, left worse than right.  Drainage is typically clear but at times yellow.  She denies fevers, chills, chest pain, or change in her chronic shortness of breath.  No abdominal pain, nausea, or vomiting.  She states that she saw her cardiologist on Friday for routine checkup and he recommended follow-up with the primary care physician for her symptoms.  Did not think that it was related to a CHF exacerbation. Patient quit smoking October 2018. No recent travel or surgeries, no hemoptysis. She is on supplemental O2 2L via [place] at night only.   The history is provided by the patient.    Past Medical History:  Diagnosis Date  . 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 Bicipital tendonitis of left shoulder 09/2014  . Cataract, immature   . COPD (chronic obstructive pulmonary disease) ([place])   . Hypertension    states under control with meds., has been on med. > 10 yr.  . Impingement syndrome of left shoulder region 09/2014  . On home oxygen  therapy    at night 2L/min  . Rotator cuff strain 09/2014   left  . Shortness of breath dyspnea    with exertion  . Thin skin   . Wears dentures    upper  . Wears partial dentures    lower    Patient Active Problem List   Diagnosis Date Noted  . Cor pulmonale, chronic ([place]) 06/29/2017  . CAP (community acquired pneumonia) 01/22/2015  . Chronic respiratory failure with hypoxia ([place]) 06/30/2014  . Pulmonary infiltrates 01/26/2013  . Nodule of right lung 08/19/2012  . Cancer screening 05/12/2012  . COPD exacerbation ([place]) 05/12/2012  . Essential hypertension 03/28/2012  . COPD GOLD III 12/02/2011  . Chronic cough 12/02/2011  . Cigarette smoker 12/02/2011    Past Surgical History:  Procedure Laterality Date  . NO PAST SURGERIES    . RESECTION DISTAL CLAVICAL Left 10/12/2014   Procedure: DISTAL CLAVICLE EXCISION;  Surgeon: Kathryne Hitch, MD;  Location: [facility];  Service: Orthopedics;  Laterality: Left;  . SHOULDER ARTHROSCOPY WITH ROTATOR CUFF REPAIR AND SUBACROMIAL DECOMPRESSION Left 10/12/2014   Procedure: LEFT SHOULDER ARTHROSCOPY, DEBRIDEMENT WITH ACROMIOPLASTY,  ROTATOR CUFF REPAIR AND RELEASE BICEPS TENDON;  Surgeon: Kathryne Hitch, MD;  Location: [facility];  Service: Orthopedics;  Laterality: Left;    OB History    No data available       Home Medications    Prior to Admission medications   Medication Sig  Start Date End Date Taking? Authorizing Provider  albuterol (PROAIR HFA) 108 (90 Base) MCG/ACT inhaler 2 puffs every 4 hours as needed only  if your can't catch your breath 03/27/17   Tanda Rockers, MD  albuterol (PROVENTIL) (2.5 MG/3ML) 0.083% nebulizer solution Take 3 mLs (2.5 mg total) by nebulization every 6 (six) hours as needed for wheezing or shortness of breath. 10/08/15   Tanda Rockers, MD  budesonide-formoterol (SYMBICORT) 160-4.5 MCG/ACT inhaler Inhale 2 puffs into the lungs daily. 06/29/17   Tanda Rockers, MD  CARTIA  XT 300 MG 24 hr capsule Take 300 mg by mouth daily. 12/11/15   [provider]  cephALEXin (KEFLEX) 500 MG capsule Take 1 capsule (500 mg total) by mouth 4 (four) times daily for 7 days. 07/04/17 07/11/17  Rodell Perna A, PA-C  cholecalciferol (VITAMIN D) 1000 UNITS tablet Take 1,000 Units by mouth daily.    [provider]  losartan (COZAAR) 100 MG tablet Take 100 mg by mouth daily. 12/10/15   [provider]  Multiple Minerals (CALCIUM-MAGNESIUM-ZINC) TABS Take 1 tablet by mouth daily.    [provider]  ondansetron (ZOFRAN) 4 MG tablet Take 1 tablet (4 mg total) by mouth every 8 (eight) hours as needed for nausea or vomiting. 01/22/15   Tanda Rockers, MD  OXYGEN Inhale 2 L into the lungs at bedtime.    [provider]  Respiratory Therapy Supplies (FLUTTER) DEVI Use as directed 02/05/16   Tanda Rockers, MD  sulfamethoxazole-trimethoprim (BACTRIM DS,SEPTRA DS) 800-160 MG tablet Take 1 tablet by mouth 2 (two) times daily for 7 days. 07/04/17 07/11/17  Rodell Perna A, PA-C  Tiotropium Bromide Monohydrate (SPIRIVA RESPIMAT) 2.5 MCG/ACT AERS Inhale 1 Inhaler into the lungs daily. 06/29/17   Tanda Rockers, MD    Family History Family History  Problem Relation Age of Onset  . Heart disease Father   . Kidney failure Father     Social History Social History   Tobacco Use  . Smoking status: Former Smoker    Packs/day: 1.00    Years: 57.00    Pack years: 57.00    Types: Cigarettes    Last attempt to quit: 03/20/2017    Years since quitting: 0.2  . Smokeless tobacco: Never Used  . Tobacco comment: <1ppd 02/05/2016  Substance Use Topics  . Alcohol use: Yes    Alcohol/week: 0.0 oz    Comment: several beers/day and "a shot of whiskey"  . Drug use: No     Allergies   Tramadol   Review of Systems Review of Systems  Constitutional: Negative for chills and fever.  Respiratory: Positive for shortness of breath (chronic, unchanged).     Cardiovascular: Positive for leg swelling. Negative for chest pain.  Gastrointestinal: Negative for abdominal pain, nausea and vomiting.  Skin: Positive for wound.     Physical Exam Updated Vital Signs BP (!) 159/90   Pulse 81   Temp 98.4 F (36.9 C) (Oral)   Resp 18   Ht 5' 2.5" (1.588 m)   Wt 47.5 kg (104 lb 11.2 oz)   SpO2 93%   BMI 18.84 kg/m   Physical Exam  Constitutional: She appears well-developed and well-nourished. No distress.  HENT:  Head: Normocephalic and atraumatic.  Eyes: Conjunctivae are normal. Right eye exhibits no discharge. Left eye exhibits no discharge.  Neck: Normal range of motion. Neck supple. No JVD present. No tracheal deviation present.  Cardiovascular: Normal rate and intact distal pulses.  Distant heart sounds. 2+ radial and DP/PT pulses bl, mild generalized tenderness to palpation of the BLE. 2+ pitting edema of the BLE with weeping lesions. See attached images.  Pulmonary/Chest: Effort normal.  Globally diminished breath sounds.  No wheezing.  Equal rise and fall of chest, no increased work of breathing, speaking in full sentences without difficulty.  Abdominal: Soft. Bowel sounds are normal. She exhibits no distension. There is no tenderness.  Musculoskeletal: Normal range of motion. She exhibits edema and tenderness.  See attached images. No deformity or crepitus noted. 5/5 strength of BLE major muscle groups.   Neurological: She is alert. No sensory deficit.  Skin: Skin is warm and dry. No erythema.  Multiple circular skin lesions to the BLE where skin tags were removed 1 month ago. R thigh with lesion with granulation tissue but no significant signs of secondary skin infection.  Lower leg lesions will weep clear and purulent fluid on palpation  Psychiatric: She has a normal mood and affect. Her behavior is normal.  Nursing note and vitals reviewed.        ED Treatments / Results  Labs (all labs ordered are listed, but only abnormal  results are displayed) Labs Reviewed  COMPREHENSIVE METABOLIC PANEL - Abnormal; Notable for the following components:      Result Value   Glucose, Bld 103 (*)    All other components within normal limits  URINALYSIS, ROUTINE W REFLEX MICROSCOPIC - Abnormal; Notable for the following components:   Ketones, ur 5 (*)    Nitrite POSITIVE (*)    Leukocytes, UA TRACE (*)    Bacteria, UA MANY (*)    Squamous Epithelial / LPF 0-5 (*)    All other components within normal limits  CBC WITH DIFFERENTIAL/PLATELET  I-STAT CG4 LACTIC ACID, ED  I-STAT CG4 LACTIC ACID, ED    EKG  EKG Interpretation None       Radiology Dg Chest 2 View  Result Date: 07/04/2017 CLINICAL DATA:  Recurrent skin infection.  Left leg swelling. EXAM: CHEST  2 VIEW COMPARISON:  September 24, 2016 FINDINGS: Scarring in the right apex is stable since December 2016. The heart, hila, mediastinum, lungs, and pleura are unchanged with no acute abnormality identified. Hyperinflation of the lungs is seen as are small bilateral effusions or pleural thickening on the lateral view. IMPRESSION: No interval change. Electronically Signed   By: Dorise Bullion III M.D   On: 07/04/2017 21:28   Dg Tibia/fibula Left  Result Date: 07/04/2017 CLINICAL DATA:  Pt c/o recurrent skin infection that has not bee resolved with 3 rounds of abx by PCP over the past few months. Left leg is reddened, swollen. Pt states both legs swell and have hx of weeping. Left lower leg has small wounds that.*comment was truncated* EXAM: LEFT TIBIA AND FIBULA - 2 VIEW COMPARISON:  None. FINDINGS: No fracture of the tibia or fibula. Knee joint and ankle joint appear normal on two views. No subcutaneous gas. IMPRESSION: No osseous abnormality. No cutaneous lesion identified by radiograph. Electronically Signed   By: Suzy Bouchard M.D.   On: 07/04/2017 21:29   Dg Tibia/fibula Right  Result Date: 07/04/2017 CLINICAL DATA:  72 year old female with cellulitis. EXAM: RIGHT  TIBIA AND FIBULA - 2 VIEW; RIGHT FOOT COMPLETE - 3+ VIEW COMPARISON:  None. FINDINGS: Small bone fragment from the base of the cuboid, age indeterminate. Acute fracture is not entirely excluded. Clinical correlation is recommended. Faint lucency through the medial malleolus, likely artifactual. No other fracture  identified. The bones are osteopenic. There is no dislocation. The ankle mortise is intact. There is soft tissue swelling of the foot primarily involving the mid and forefoot. There is soft tissue swelling of the ankle diffuse subcutaneous edema. IMPRESSION: 1. Tiny bony fragment from the corner of the base of the cuboid suspicious for an age indeterminate fracture. Linear density through the medial malleolus, likely artifactual. No dislocation. 2. Diffuse subcutaneous edema as well as soft tissue swelling of the ankle and foot most consistent with cellulitis. No soft tissue air. Electronically Signed   By: Anner Crete M.D.   On: 07/04/2017 21:34   Dg Foot Complete Left  Result Date: 07/04/2017 CLINICAL DATA:  Recurrent skin infection. EXAM: LEFT FOOT - COMPLETE 3+ VIEW COMPARISON:  None. FINDINGS: No fracture or dislocation of mid foot or forefoot. The phalanges are normal. The calcaneus is normal. No soft tissue abnormality. No subcutaneous gas.  No cellulitis identified by radiograph. IMPRESSION: 1. No osseous abnormality. 2. No cutaneous lesions identified by radiograph. Electronically Signed   By: Suzy Bouchard M.D.   On: 07/04/2017 21:30   Dg Foot Complete Right  Result Date: 07/04/2017 CLINICAL DATA:  72 year old female with cellulitis. EXAM: RIGHT TIBIA AND FIBULA - 2 VIEW; RIGHT FOOT COMPLETE - 3+ VIEW COMPARISON:  None. FINDINGS: Small bone fragment from the base of the cuboid, age indeterminate. Acute fracture is not entirely excluded. Clinical correlation is recommended. Faint lucency through the medial malleolus, likely artifactual. No other fracture identified. The bones are  osteopenic. There is no dislocation. The ankle mortise is intact. There is soft tissue swelling of the foot primarily involving the mid and forefoot. There is soft tissue swelling of the ankle diffuse subcutaneous edema. IMPRESSION: 1. Tiny bony fragment from the corner of the base of the cuboid suspicious for an age indeterminate fracture. Linear density through the medial malleolus, likely artifactual. No dislocation. 2. Diffuse subcutaneous edema as well as soft tissue swelling of the ankle and foot most consistent with cellulitis. No soft tissue air. Electronically Signed   By: Anner Crete M.D.   On: 07/04/2017 21:34    Procedures Procedures (including critical care time)  Medications Ordered in ED Medications  sulfamethoxazole-trimethoprim (BACTRIM DS,SEPTRA DS) 800-160 MG per tablet 1 tablet (1 tablet Oral Given 07/04/17 2239)  cephALEXin (KEFLEX) capsule 500 mg (500 mg Oral Given 07/04/17 2239)     Initial Impression / Assessment and Plan / ED Course  I have reviewed the triage vital signs and the nursing notes.  Pertinent labs & imaging results that were available during my care of the patient were reviewed by me and considered in my medical decision making (see chart for details).     Patient presents  for evaluation of ongoing waxing and waning cellulitis of the bilateral lower extremities.  Afebrile, vital signs are stable.  Patient is nontoxic in appearance.  Physical examination shows bilateral lower extremity edema and erythema, left worse than right.  She has multiple small chronic appearing poorly healing wounds   And lesions weeping serous fluid.  I doubt DVT.  She has hypoactive breath sounds but no crackles and I doubt CHF as patient has no history of this and was evaluated by her cardiologist yesterday.  Additionally, chest x-ray shows no interval changes including no evidence of volume overload and patient has no worsening of her chronic shortness of breath secondary to  her COPD.  No leukocytosis, no electrolyte abnormalities.  Lactate is negative.  I do not  suspect sepsis in this patient.  UA is consistent with UTI.  Radiographs of the bilateral lower extremities show edema but are not concerning for necrotizing fasciitis or osteomyelitis.  Radiographs of the right foot show a tiny bone fragment from the corner of the base of the cuboid.  Radiologist cannot exclude acute fracture, however I doubt she has an acute fracture in the absence of trauma.  Patient stable for discharge home and outpatient management of her lower extremity cellulitis and UTI with Bactrim and Keflex as well as Lasix for edema.  She will follow-up with her primary care physician for reevaluation of her symptoms.  Discussed indications for return to the ED. Pt verbalized understanding of and agreement with plan and is safe for discharge home at this time.  Patient seen and evaluated by Dr. Johnney Killian who agrees with assessment and plan at this time.   Final Clinical Impressions(s) / ED Diagnoses   Final diagnoses:  Cellulitis of left lower extremity  Cellulitis of right lower extremity  Peripheral edema  Acute cystitis without hematuria    ED Discharge Orders        Ordered    cephALEXin (KEFLEX) 500 MG capsule  4 times daily     07/04/17 2214    sulfamethoxazole-trimethoprim (BACTRIM DS,SEPTRA DS) 800-160 MG tablet  2 times daily     07/04/17 2214       Renita Papa, PA-C 07/05/17 5207    Charlesetta Shanks, MD 07/05/17 2344

## 2017-07-04 NOTE — ED Notes (Signed)
Patient transported to X-ray 

## 2017-07-04 NOTE — ED Provider Notes (Signed)
Medical screening examination/treatment/procedure(s) were conducted as a shared visit with non-physician practitioner(s) and myself.  I personally evaluated the patient during the encounter.   EKG Interpretation None     She has had recurrent episodes of swelling and pain of her lower legs.  She has small wounds that healed poorly.  She reports Bactrim has been the most effective medication at improving the symptoms.  She reports they are painful and aching and sometimes a week.  Patient is alert and nontoxic.  Extremely thin.  No respiratory distress.  Lower extremities have peripheral edema pitting.  Diffuse erythema with skin thinning and small erosive wounds.  Skin generally is thin and fragile with much senile ecchymoses.  Patient is alert and nontoxic.  I feel she is stable for continued outpatient management.  It appears the patient has significant chronic venous stasis with much difficulty healing minor wounds.  I have reviewed with patient long-term management.  Plan will be for her to take Lasix as was prescribed by her provider pursue the mechanical treatment of venous stasis with elevation compression and Bactrim Keflex for acute cellulitis on chronic venous stasis.   Charlesetta Shanks, MD 07/04/17 2215

## 2017-07-04 NOTE — Discharge Instructions (Signed)
Please take all of your antibiotics until finished!   You may develop abdominal discomfort or diarrhea from the antibiotic.  You may help offset this with probiotics which you can buy or get in yogurt. Do not eat  or take the probiotics until 2 hours after your antibiotic.   Continue taking Lasix for the swelling in your legs.  Apply antibiotic ointment as needed.  Follow-up with your primary care physician in the next 3-5 days for reevaluation of your symptoms.  Return to the ED if any concerning signs or symptoms develop such as fever, worsening spread of redness or streaking, chest pain, or difficulty breathing.

## 2017-07-04 NOTE — ED Notes (Signed)
ED Provider at bedside. 

## 2017-08-03 ENCOUNTER — Telehealth: Payer: Self-pay | Admitting: Internal Medicine

## 2017-08-03 NOTE — Telephone Encounter (Signed)
Called and spoke with patient, advised her that we do not have any samples at this time.

## 2017-09-28 ENCOUNTER — Ambulatory Visit (INDEPENDENT_AMBULATORY_CARE_PROVIDER_SITE_OTHER): Payer: Medicare Other | Admitting: Internal Medicine

## 2017-09-28 ENCOUNTER — Encounter: Payer: Self-pay | Admitting: Internal Medicine

## 2017-09-28 VITALS — BP 126/74 | HR 81 | Ht 62.5 in | Wt 106.0 lb

## 2017-09-28 DIAGNOSIS — J9611 Chronic respiratory failure with hypoxia: Secondary | ICD-10-CM

## 2017-09-28 DIAGNOSIS — J449 Chronic obstructive pulmonary disease, unspecified: Secondary | ICD-10-CM

## 2017-09-28 DIAGNOSIS — J441 Chronic obstructive pulmonary disease with (acute) exacerbation: Secondary | ICD-10-CM | POA: Diagnosis not present

## 2017-09-28 DIAGNOSIS — I2781 Cor pulmonale (chronic): Secondary | ICD-10-CM

## 2017-09-28 MED ORDER — SPIRIVA RESPIMAT 2.5 MCG/ACT IN AERS: INHALATION_SPRAY | RESPIRATORY_TRACT | 0 refills | Status: DC

## 2017-09-28 MED ORDER — AZITHROMYCIN 250 MG PO TABS: ORAL_TABLET | ORAL | 0 refills | Status: DC

## 2017-09-28 MED ORDER — BUDESONIDE-FORMOTEROL FUMARATE 160-4.5 MCG/ACT IN AERO: 2.0000 | INHALATION_SPRAY | Freq: Every day | RESPIRATORY_TRACT | 0 refills | Status: DC

## 2017-09-28 MED ORDER — SPIRIVA RESPIMAT 2.5 MCG/ACT IN AERS: INHALATION_SPRAY | RESPIRATORY_TRACT | Status: DC

## 2017-09-28 NOTE — Patient Instructions (Addendum)
zpak    Take all medications to your doctors so they all have an accurate list of your medications   Please schedule a follow up office visit in 6 weeks, call sooner if needed with pfts on return

## 2017-09-28 NOTE — Assessment & Plan Note (Signed)
Clinical dx/ rx with diuretics per Dr Terrence Dupont relatively well compensated at present

## 2017-09-28 NOTE — Assessment & Plan Note (Signed)
pfts 11/2011   FEV1 0.86 (42%) ratio 35  -- 09/14/2015  hfa Arlana Pouch 11/05/2015 (when spiriva dpi supply runs out) - flutter valve added 02/05/2016  And recoached 05/14/2016     Flare late march 2018 with rhinitis >  rx zpak then aug/pred as of 09/24/2016    09/24/2016  After extensive coaching HFA effectiveness =    75% from baseline of 50% (short Ti)  I had an extended discussion with the patient reviewing all relevant studies completed to date and  lasting 15 to 20 minutes of a 25 minute acute visit    Each maintenance medication was reviewed in detail including most importantly the difference between maintenance and prns and under what circumstances the prns are to be triggered using an action plan format that is not reflected in the computer generated alphabetically organized AVS.    Please see AVS for specific instructions unique to this visit that I personally wrote and verbalized to the the pt in detail and then reviewed with pt  by my nurse highlighting any  changes in therapy recommended at today's visit to their plan of care.

## 2017-09-28 NOTE — Assessment & Plan Note (Addendum)
pfts 11/2011   FEV1 0.86 (42%) ratio 35  - 09/14/2015  hfa Arlana Pouch 11/05/2015 (when spiriva dpi supply runs out) - flutter valve added 02/05/2016  And recoached 05/14/2016  -  03/27/2017  After extensive coaching HFA effectiveness =    75% (ti too short)  - PFT's  06/29/2017  FEV1 0.92 (44 % ) ratio 44  p 11 % improvement from saba p symb/spiriva prior to study with DLCO  33/33 % corrects to 35 % for alv volume    Severe copd/ improved since quit smoking  - The proper method of use, as well as anticipated side effects, of a metered-dose inhaler are discussed and demonstrated to the patient. Improved effectiveness after extensive coaching during this visit to a level of approximately 75 % from a baseline of 50  % > ti too short and may need to consider change to dpi or neb next ov    I had an extended discussion with the patient reviewing all relevant studies completed to date and  lasting 15 to 20 minutes of a 25 minute visit    Each maintenance medication was reviewed in detail including most importantly the difference between maintenance and prns and under what circumstances the prns are to be triggered using an action plan format that is not reflected in the computer generated alphabetically organized AVS.    Please see AVS for specific instructions unique to this visit that I personally wrote and verbalized to the the pt in detail and then reviewed with pt  by my nurse highlighting any  changes in therapy recommended at today's visit to their plan of care.    Return with all meds in hand using a trust but verify approach to confirm accurate Medication  Reconciliation The principal here is that until we are certain that the  patients are doing what we've asked, it makes no sense to ask them to do more.

## 2017-09-28 NOTE — Assessment & Plan Note (Signed)
Exertional desats 06/30/2014 >O2 2lm rx with act  - 11/09/2014  Walked RA  2 laps @ 185 ft each stopped due to  Sat down to 88 % at nl pace but no sob  - ono RA  09/22/15  desat < 89% x 1:84mn > rec continue 02 2lpm - 42/70/7867certification: Patient Saturations on Room Air at Rest = 94% and while Ambulating 2 laps x 185 each= 87% Patient Saturations on 2Liters of oxygen while Ambulating =96% x one additional lap - 11/05/2015  Walked RA x 4 laps @ 185 ft each stopped due to  desat to 87% resolved on 2lpm   - 06/29/2017  Walked RA x 3 laps @ 185 ft each stopped due to  End of study/ slow pace, no desat  rec 09/28/2017 = 2lpm hs and prn with ex

## 2017-09-28 NOTE — Assessment & Plan Note (Signed)
Mild flare despite rx with laba/lama/ ICS for group D copd   rx zpak  Work harder on hfa/ f/u with pfts

## 2017-09-28 NOTE — Progress Notes (Signed)
Subjective:   Patient ID: NEVIA HENKIN, female    DOB: 16-Apr-1946   MRN: [id number]  Brief patient profile:  59 yowf quit smoking 03/2017  followed in pulmonary clinic for GOLD III copd   History of Present Illness  11/05/2015  f/u ov/Matheu Ploeger re: GOLD III / still smoking / maint rx symb/spriiva dpi  Chief Complaint  Patient presents with  . Follow-up    pt states breathing is baseline. c/o cont SOB, prod cough tan to greenish in color, wheezing, cp/tightness  dyspnea is variable and has portable tanks but never uses them to see if makes any difference in her variable doe / using saba up to 4 x daily instead.   rec You are qualified for portable 02 if you wish but no need to wear it at rest or walking around the house - continue it at bedtime thru the concentrator at 2lpm and wear the portable system as needed during the day Ok to finish up the powder spiriva and just refill the respimat    12/24/2016  f/u ov/Marlin Jarrard re:  COPD GOLD III/ still smoking/ symb/spiriva / 02 2lpm hs only  Chief Complaint  Patient presents with  . Follow-up    Pt states she is still having increase sob with exerton, she still has a slight cough with clear mucus with a tint of blood, lots of wheezing,Denies chest tightness,fever   using saba hfa up to twice daily but no needing noct Doe = MMRC3 = can't walk 100 yards even at a slow pace at a flat grade s stopping due to sob   Has slt bloody nasal discharge in am and scant blood streaked sputum also, just in am and just when nose is bleeding too   03/27/2017  f/u ov/Lavera Vandermeer re:  Copd III / 02 hs 2lpm / never using amb 02  Doe = MMRC 3 still off 02  Sleeping on 2lpm with typical gurgling cough in am > sev tsp of thick white mucus/ occ streaks of blood rec Work on inhaler technique:  relax and gently blow all the way out then take a nice smooth deep breath back in, triggering the inhaler at same time you start breathing in.  Hold for up to 5 seconds if you can. Blow  out thru nose. Rinse and gargle with water when done Change the ventolin to proair up to 2 pffs every 4 hours as needed     06/29/2017  f/u ov/Calayah Guadarrama re:   GOLD III copd/ 02 hs only  Quit smoking 03/2017  Chief Complaint  Patient presents with  . Follow-up    PFT's done today.  Breathing is unchanged. She has been having some swelling in her legs and feet.   doe = MMRC3 = can't walk 100 yards even at a slow pace at a flat grade s stopping due to sob  rec Work on inhaler technique:  No change in medications  02 is 2lpm at bedtime  Talk to Dr Terrence Dupont re your water pill  Please schedule a follow up visit in 3 months       09/28/2017  f/u ov/Quante Pettry re: copd gold III/ 02 hs only, still off cigs / confused with names of meds  Chief Complaint  Patient presents with  . Follow-up    Breathing is doing about the same. She has been coughing up some yellow sputum for the past 2 days. She is using her albuterol inhaler 2-3 x per wk on average.  Dyspnea:  MMRC3 = can't walk 100 yards even at a slow pace at a flat grade s stopping due to sob  / no 02 Cough: congested, worse in am and more yellow x sev days prior to OV    Sleep: ok SABA use: rarely  No obvious day to day or daytime variability or assoc excess/ purulent sputum or mucus plugs or hemoptysis or cp or chest tightness, subjective wheeze or overt sinus or hb symptoms. No unusual exposure hx or h/o childhood pna/ asthma or knowledge of premature birth.  Sleeping  Ok on 2lpm   without nocturnal  or early am exacerbation  of respiratory  c/o's or need for noct saba. Also denies any obvious fluctuation of symptoms with weather or environmental changes or other aggravating or alleviating factors except as outlined above   Current Allergies, Complete Past Medical History, Past Surgical History, Family History, and Social History were reviewed in [employer] record.  ROS  The following are not active complaints unless  bolded Hoarseness, sore throat, dysphagia, dental problems, itching, sneezing,  nasal congestion or discharge of excess mucus or purulent secretions, ear ache,   fever, chills, sweats, unintended wt loss or wt gain, classically pleuritic or exertional cp,  orthopnea pnd or arm/hand swelling  or leg swelling, presyncope, palpitations, abdominal pain, anorexia, nausea, vomiting, diarrhea  or change in bowel habits or change in bladder habits, change in stools or change in urine, dysuria, hematuria,  rash, arthralgias, visual complaints, headache, numbness, weakness or ataxia or problems with walking or coordination,  change in mood or  memory.        Current Meds  Medication Sig  . albuterol (PROAIR HFA) 108 (90 Base) MCG/ACT inhaler 2 puffs every 4 hours as needed only  if your can't catch your breath  . albuterol (PROVENTIL) (2.5 MG/3ML) 0.083% nebulizer solution Take 3 mLs (2.5 mg total) by nebulization every 6 (six) hours as needed for wheezing or shortness of breath.  . budesonide-formoterol (SYMBICORT) 160-4.5 MCG/ACT inhaler Inhale 2 puffs into the lungs daily.  Marland Kitchen CARTIA XT 300 MG 24 hr capsule Take 300 mg by mouth daily.  . cholecalciferol (VITAMIN D) 1000 UNITS tablet Take 1,000 Units by mouth daily.  Marland Kitchen losartan (COZAAR) 100 MG tablet Take 100 mg by mouth daily.  . Multiple Minerals (CALCIUM-MAGNESIUM-ZINC) TABS Take 1 tablet by mouth daily.  . ondansetron (ZOFRAN) 4 MG tablet Take 1 tablet (4 mg total) by mouth every 8 (eight) hours as needed for nausea or vomiting.  . OXYGEN Inhale 2 L into the lungs at bedtime.  Marland Kitchen Respiratory Therapy Supplies (FLUTTER) DEVI Use as directed  . SPIRIVA RESPIMAT 2.5 MCG/ACT AERS 2 puffs each am  .     .     .                        Objective:   Physical Exam    Chronically ill amb elderly wf > stated age   Vital signs reviewed - Note on arrival 02 sats  93% on RA    09/26/2013  101>102 10/11/2013 > 01/16/2014  99 > 04/06/2014  103 >105  06/30/2014 > 08/11/2014  101 > 11/09/2014  104 > 01/22/2015 106  > 02/06/2015 105 > 03/08/2015 106 > 04/18/2015  106 > 06/15/2015 103 > 09/14/2015   102 > 10/08/2015   103 > 11/05/2015  102 > 02/05/2016  103 > 05/14/2016 102 > 09/24/2016  99 >  12/24/2016  99 > 06/29/2017  104 > 09/28/2017  106    HEENT: nl  oropharynx. Nl external ear canals without cough reflex - mild bilateral non-specific turbinate edema/ top denture/ bottom partial     NECK :  without JVD/Nodes/TM/ nl carotid upstrokes bilaterally   LUNGS: no acc muscle use,  Mod barrel  contour chest wall with bilateral  Distant bs s audible wheeze and  without cough on insp or exp maneuver and mod   Hyperresonant  to  percussion bilaterally     CV:  RRR  no s3 or murmur or increase in P2, and elastic hose both LE's with trace to 1+ ptting sym bilaterally    ABD:  soft and nontender with pos mid insp Hoover's  in the supine position. No bruits or organomegaly appreciated, bowel sounds nl  MS:   Nl gait/  ext warm without deformities, calf tenderness, cyanosis or clubbing No obvious joint restrictions   SKIN: warm and dry with chronic venous changes both LE's    NEURO:  alert, approp, nl sensorium with  no motor or cerebellar deficits apparent.                  Assessment & Plan:

## 2017-10-14 ENCOUNTER — Telehealth: Payer: Self-pay | Admitting: Internal Medicine

## 2017-10-14 NOTE — Telephone Encounter (Signed)
Called and spoke to pt.  Pt is scheduled for OV with PFT piror on 11/11/17. Pt is wanting to know if repeat PFT is needed, as she had a PFT on 06/29/17. Pt also wanted to make MW aware that her cardiologist has prescribed potassium. Pt wanted to make sure Dr. Melvyn Novas was okay with this prior to starting medication.  MW please advise. Thanks   09/28/17 AVS:   Prior Versions: 1. Tanda Rockers, MD (Physician) at 09/28/2017 4:10 PM - Signed     zpak    Take all medications to your doctors so they all have an accurate list of your medications   Please schedule a follow up office visit in 6 weeks, call sooner if needed with pfts on return

## 2017-10-14 NOTE — Telephone Encounter (Signed)
Pt is aware of  below message and voiced her understanding.  PFT for 11/11/17 has been canceled.  MW please advise on pt starting Potassium tabs. Thanks

## 2017-10-14 NOTE — Telephone Encounter (Addendum)
I can't comment on it without seeing her labs also but it does not have anything to do one way or the other with her pulmonary status and would have to defer to whoever wrote for it

## 2017-10-14 NOTE — Telephone Encounter (Signed)
She is correct, should not need another pft/ ok to cancel

## 2017-10-15 NOTE — Telephone Encounter (Signed)
Attempt to call patient, no answer, message left to call back.

## 2017-10-15 NOTE — Telephone Encounter (Signed)
Spoke with pt,aware of recs.  Nothing further needed.  

## 2017-10-15 NOTE — Telephone Encounter (Signed)
Pt is calling back  [phone]

## 2017-10-17 ENCOUNTER — Other Ambulatory Visit: Payer: Self-pay | Admitting: Internal Medicine

## 2017-11-11 ENCOUNTER — Ambulatory Visit (INDEPENDENT_AMBULATORY_CARE_PROVIDER_SITE_OTHER): Payer: Medicare Other | Admitting: Internal Medicine

## 2017-11-11 ENCOUNTER — Encounter: Payer: Self-pay | Admitting: Internal Medicine

## 2017-11-11 ENCOUNTER — Ambulatory Visit: Payer: Medicare Other | Admitting: Internal Medicine

## 2017-11-11 VITALS — BP 128/70 | HR 83 | Ht 63.0 in | Wt 104.0 lb

## 2017-11-11 DIAGNOSIS — J9611 Chronic respiratory failure with hypoxia: Secondary | ICD-10-CM | POA: Diagnosis not present

## 2017-11-11 DIAGNOSIS — J449 Chronic obstructive pulmonary disease, unspecified: Secondary | ICD-10-CM | POA: Diagnosis not present

## 2017-11-11 DIAGNOSIS — I2781 Cor pulmonale (chronic): Secondary | ICD-10-CM | POA: Diagnosis not present

## 2017-11-11 NOTE — Patient Instructions (Addendum)
Discuss the fluid pills with yDr Redmond Pulling but I will recommend he consider adding aldactone if not improving on lasix alone but have to be very careful with potassium monitoring with these changes so I will defer to him   Please schedule a follow up visit in 6 months but call sooner if needed - referred to rehab

## 2017-11-11 NOTE — Progress Notes (Signed)
Subjective:   Patient ID: NEVIA HENKIN, female    DOB: 16-Apr-1946   MRN: [id number]  Brief patient profile:  59 yowf quit smoking 03/2017  followed in pulmonary clinic for GOLD III copd   History of Present Illness  11/05/2015  f/u ov/Adelfo Diebel re: GOLD III / still smoking / maint rx symb/spriiva dpi  Chief Complaint  Patient presents with  . Follow-up    pt states breathing is baseline. c/o cont SOB, prod cough tan to greenish in color, wheezing, cp/tightness  dyspnea is variable and has portable tanks but never uses them to see if makes any difference in her variable doe / using saba up to 4 x daily instead.   rec You are qualified for portable 02 if you wish but no need to wear it at rest or walking around the house - continue it at bedtime thru the concentrator at 2lpm and wear the portable system as needed during the day Ok to finish up the powder spiriva and just refill the respimat    12/24/2016  f/u ov/Shantale Holtmeyer re:  COPD GOLD III/ still smoking/ symb/spiriva / 02 2lpm hs only  Chief Complaint  Patient presents with  . Follow-up    Pt states she is still having increase sob with exerton, she still has a slight cough with clear mucus with a tint of blood, lots of wheezing,Denies chest tightness,fever   using saba hfa up to twice daily but no needing noct Doe = MMRC3 = can't walk 100 yards even at a slow pace at a flat grade s stopping due to sob   Has slt bloody nasal discharge in am and scant blood streaked sputum also, just in am and just when nose is bleeding too   03/27/2017  f/u ov/Jahmez Bily re:  Copd III / 02 hs 2lpm / never using amb 02  Doe = MMRC 3 still off 02  Sleeping on 2lpm with typical gurgling cough in am > sev tsp of thick white mucus/ occ streaks of blood rec Work on inhaler technique:  relax and gently blow all the way out then take a nice smooth deep breath back in, triggering the inhaler at same time you start breathing in.  Hold for up to 5 seconds if you can. Blow  out thru nose. Rinse and gargle with water when done Change the ventolin to proair up to 2 pffs every 4 hours as needed     06/29/2017  f/u ov/Loney Peto re:   GOLD III copd/ 02 hs only  Quit smoking 03/2017  Chief Complaint  Patient presents with  . Follow-up    PFT's done today.  Breathing is unchanged. She has been having some swelling in her legs and feet.   doe = MMRC3 = can't walk 100 yards even at a slow pace at a flat grade s stopping due to sob  rec Work on inhaler technique:  No change in medications  02 is 2lpm at bedtime  Talk to Dr Terrence Dupont re your water pill  Please schedule a follow up visit in 3 months       09/28/2017  f/u ov/Raffaella Edison re: copd gold III/ 02 hs only, still off cigs / confused with names of meds  Chief Complaint  Patient presents with  . Follow-up    Breathing is doing about the same. She has been coughing up some yellow sputum for the past 2 days. She is using her albuterol inhaler 2-3 x per wk on average.  Dyspnea:  MMRC3 = can't walk 100 yards even at a slow pace at a flat grade s stopping due to sob  / no 02 Cough: congested, worse in am and more yellow x sev days prior to OV    Sleep: ok SABA use: rarely rec zpak  Take all medications to your doctors so they all have an accurate list of your medications Please schedule a follow up office visit in 6 weeks, call sooner if needed with pfts on return      11/11/2017  f/u ov/Lillyth Spong re:  GOLD III/ 02 hs prn  Chief Complaint  Patient presents with  . Follow-up    Breathing is about the same. She is using her albuterol inhaler 2-3 x per wk on average and she rarely uses neb.   Dyspnea:   No change in doe = MMRC3  Cough: not muchat all  Sleep: flat on 2lpm  SABA use:  As above   No obvious day to day or daytime variability or assoc excess/ purulent sputum or mucus plugs or hemoptysis or cp or chest tightness, subjective wheeze or overt sinus or hb symptoms. No unusual exposure hx or h/o childhood pna/  asthma or knowledge of premature birth.  Sleeping  Ok on 2lpm flat  without nocturnal  or early am exacerbation  of respiratory  c/o's or need for noct saba. Also denies any obvious fluctuation of symptoms with weather or environmental changes or other aggravating or alleviating factors except as outlined above   Current Allergies, Complete Past Medical History, Past Surgical History, Family History, and Social History were reviewed in [employer] record.  ROS  The following are not active complaints unless bolded Hoarseness, sore throat, dysphagia, dental problems, itching, sneezing,  nasal congestion or discharge of excess mucus or purulent secretions, ear ache,   fever, chills, sweats, unintended wt loss or wt gain, classically pleuritic or exertional cp,  orthopnea pnd or arm/hand swelling  or leg swelling, presyncope, palpitations, abdominal pain, anorexia, nausea, vomiting, diarrhea  or change in bowel habits or change in bladder habits, change in stools or change in urine, dysuria, hematuria,  rash, arthralgias, visual complaints, headache, numbness, weakness or ataxia or problems with walking or coordination,  change in mood or  memory.        Current Meds  Medication Sig  . albuterol (PROAIR HFA) 108 (90 Base) MCG/ACT inhaler 2 puffs every 4 hours as needed only  if your can't catch your breath  . albuterol (PROVENTIL) (2.5 MG/3ML) 0.083% nebulizer solution Take 3 mLs (2.5 mg total) by nebulization every 6 (six) hours as needed for wheezing or shortness of breath.  . budesonide-formoterol (SYMBICORT) 160-4.5 MCG/ACT inhaler Inhale 2 puffs into the lungs daily.  Marland Kitchen buPROPion (WELLBUTRIN SR) 150 MG 12 hr tablet 1 tablet daily.  Marland Kitchen CARTIA XT 300 MG 24 hr capsule Take 300 mg by mouth daily.  . cholecalciferol (VITAMIN D) 1000 UNITS tablet Take 1,000 Units by mouth daily.  Marland Kitchen losartan (COZAAR) 100 MG tablet Take 100 mg by mouth daily.  . Multiple Minerals  (CALCIUM-MAGNESIUM-ZINC) TABS Take 1 tablet by mouth daily.  . ondansetron (ZOFRAN) 4 MG tablet Take 1 tablet (4 mg total) by mouth every 8 (eight) hours as needed for nausea or vomiting.  . OXYGEN Inhale 2 L into the lungs at bedtime.  . potassium chloride (K-DUR) 10 MEQ tablet Take 1 tablet by mouth daily. When takes lasix  . Respiratory Therapy Supplies (FLUTTER) DEVI Use  as directed  . Tiotropium Bromide Monohydrate (SPIRIVA RESPIMAT) 2.5 MCG/ACT AERS INHALE 2 PUFFS INTO THE LUNGS EVERY MORNING  . triamcinolone cream (KENALOG) 0.1 % As directed when needed             Objective:   Physical Exam    Chronically ill amb wf    Vital signs reviewed - Note on arrival 02 sats  92% on RA     09/26/2013  101>102 10/11/2013 > 01/16/2014  99 > 04/06/2014  103 >105 06/30/2014 > 08/11/2014  101 > 11/09/2014  104 > 01/22/2015 106  > 02/06/2015 105 > 03/08/2015 106 > 04/18/2015  106 > 06/15/2015 103 > 09/14/2015   102 > 10/08/2015   103 > 11/05/2015  102 > 02/05/2016  103 > 05/14/2016 102 > 09/24/2016  99 > 12/24/2016  99 > 06/29/2017  104 > 09/28/2017  106 > 11/11/2017 104   HEENT: nl  oropharynx. Nl external ear canals without cough reflex - mild bilateral non-specific turbinate edema and top denture/ bottom partial      NECK :  without JVD/Nodes/TM/ nl carotid upstrokes bilaterally   LUNGS: no acc muscle use,  Mod barrel  contour chest wall with bilateral  Distant bs s audible wheeze and  without cough on insp or exp maneuver and mod   Hyperresonant  to  percussion bilaterally     CV:  RRR  no s3 or murmur or increase in P2, and 1+ pitting both lower ext slt worse on R    ABD:  soft and nontender with pos mid insp Hoover's  in the supine position. No bruits or organomegaly appreciated, bowel sounds nl  MS:   Nl gait/  ext warm without deformities, calf tenderness, cyanosis or clubbing No obvious joint restrictions   SKIN: warm and dry with chronic venous stasis changes both LE's   NEURO:  alert, approp,  nl sensorium with  no motor or cerebellar deficits apparent.                              Assessment & Plan:

## 2017-11-12 ENCOUNTER — Encounter: Payer: Self-pay | Admitting: Internal Medicine

## 2017-11-12 ENCOUNTER — Telehealth: Payer: Self-pay | Admitting: *Deleted

## 2017-11-12 DIAGNOSIS — J449 Chronic obstructive pulmonary disease, unspecified: Secondary | ICD-10-CM

## 2017-11-12 NOTE — Assessment & Plan Note (Addendum)
pfts 11/2011   FEV1 0.86 (42%) ratio 35  - 09/14/2015  hfa Arlana Pouch 11/05/2015 (when spiriva dpi supply runs out) - flutter valve added 02/05/2016  And recoached 05/14/2016  -  03/27/2017  After extensive coaching HFA effectiveness =    75% (ti too short)  - PFT's  06/29/2017  FEV1 0.92 (44 % ) ratio 44  p 11 % improvement from saba p symb/spiriva prior to study with DLCO  33/33 % corrects to 35 % for alv volume    Despite reported smoking cessation 03/2017 pt has severe copd and  Group D in terms of symptom/risk and laba/lama/ICS  therefore appropriate rx at this point and also likely cor pulmonale - see sep a/p  No change in rx feasible at this point/ consider rehab referral again if she is willing

## 2017-11-12 NOTE — Telephone Encounter (Signed)
-----  Message from Tanda Rockers, MD sent at 11/12/2017  4:53 AM EDT ----- I don't see where we ever referred her to rehab - see if she's willing now

## 2017-11-12 NOTE — Assessment & Plan Note (Addendum)
Refractory to lasix, would strongly consider adding aldactone at this point but as is already on K this all needs to be  Monitored/titrated  in one clinic (Dr Redmond Pulling, pcp) and we will see back here q 6 m , sooner prn  I had an extended discussion with the patient reviewing all relevant studies completed to date and  lasting 15 to 20 minutes of a 25 minute visit      device teaching   extended face to face time for this visit   Each maintenance medication was reviewed in detail including most importantly the difference between maintenance and prns and under what circumstances the prns are to be triggered using an action plan format that is not reflected in the computer generated alphabetically organized AVS.    Please see AVS for specific instructions unique to this visit that I personally wrote and verbalized to the the pt in detail and then reviewed with pt  by my nurse highlighting any  changes in therapy recommended at today's visit to their plan of care.

## 2017-11-12 NOTE — Telephone Encounter (Signed)
LMTCB

## 2017-11-13 NOTE — Telephone Encounter (Signed)
Patient returned, CB is [phone]

## 2017-11-13 NOTE — Telephone Encounter (Signed)
Spoke with the pt  She is willing to try rehab depending on the hours  I have sent referral  Nothing further needed

## 2017-11-24 ENCOUNTER — Telehealth (HOSPITAL_COMMUNITY): Payer: Self-pay

## 2017-11-24 NOTE — Telephone Encounter (Signed)
Attempted to call patient in regards to Pulmonary Rehab - lm on vm

## 2017-11-26 ENCOUNTER — Telehealth (HOSPITAL_COMMUNITY): Payer: Self-pay

## 2017-11-26 NOTE — Telephone Encounter (Signed)
Called pt to see if she would like to join the Pulmonary Rehab program. Pt states that she would not able to join b/c she does not get off of work until 3:00 p.m. - Closed referral

## 2017-12-12 ENCOUNTER — Encounter (HOSPITAL_COMMUNITY): Payer: Self-pay

## 2017-12-12 ENCOUNTER — Emergency Department (HOSPITAL_COMMUNITY)
Admission: EM | Admit: 2017-12-12 | Discharge: 2017-12-12 | Disposition: A | Discharge status: Home / Self Care | Payer: Medicare Other | Attending: Emergency Medicine | Admitting: Emergency Medicine

## 2017-12-12 ENCOUNTER — Emergency Department (HOSPITAL_COMMUNITY): Payer: Medicare Other

## 2017-12-12 ENCOUNTER — Other Ambulatory Visit: Payer: Self-pay

## 2017-12-12 DIAGNOSIS — I10 Essential (primary) hypertension: Secondary | ICD-10-CM | POA: Diagnosis not present

## 2017-12-12 DIAGNOSIS — R102 Pelvic and perineal pain: Secondary | ICD-10-CM

## 2017-12-12 DIAGNOSIS — Z79899 Other long term (current) drug therapy: Secondary | ICD-10-CM | POA: Insufficient documentation

## 2017-12-12 DIAGNOSIS — N814 Uterovaginal prolapse, unspecified: Secondary | ICD-10-CM | POA: Diagnosis not present

## 2017-12-12 DIAGNOSIS — J449 Chronic obstructive pulmonary disease, unspecified: Secondary | ICD-10-CM | POA: Diagnosis not present

## 2017-12-12 DIAGNOSIS — Z87891 Personal history of nicotine dependence: Secondary | ICD-10-CM | POA: Insufficient documentation

## 2017-12-12 DIAGNOSIS — N8110 Cystocele, unspecified: Secondary | ICD-10-CM

## 2017-12-12 LAB — URINALYSIS, ROUTINE W REFLEX MICROSCOPIC
Bilirubin Urine: NEGATIVE
Glucose, UA: NEGATIVE mg/dL
HGB URINE DIPSTICK: NEGATIVE
KETONES UR: NEGATIVE mg/dL
Nitrite: NEGATIVE
PROTEIN: NEGATIVE mg/dL
Specific Gravity, Urine: 1.021 (ref 1.005–1.030)
WBC, UA: >50 WBC/hpf — ABNORMAL HIGH (ref 0–5)
pH: 5 (ref 5.0–8.0)

## 2017-12-12 MED ORDER — CEPHALEXIN 500 MG PO CAPS: 500.0000 mg | ORAL_CAPSULE | Freq: Two times a day (BID) | ORAL | 0 refills | Status: AC

## 2017-12-12 NOTE — Discharge Instructions (Addendum)
Today you were seen in the ED for vaginal prolapse and pelvic pain.You were diagnosed with a urinary tract infection.I have prescribed you antibiotics to treat the infection. Please take antibiotics as directed.I provided a GYN referral, please schedule appointment.If symptoms worsen please return to the ED or if you experience any chest pain or SOB.

## 2017-12-12 NOTE — ED Triage Notes (Signed)
Pt presents with c/o pain in her pelvis area. Pt reports that she believes her bladder has "fallen". Pt reports she has pressure in her pelvis and can see something in her vagina.

## 2017-12-12 NOTE — ED Provider Notes (Signed)
[facility] DEPT Provider Note   CSN: [id number] Arrival date & time: 12/12/17  1614     History   Chief Complaint Chief Complaint  Patient presents with  . Pelvic Pain    HPI [name] is a 72 y.o. female.  [name] is a 72 y.o female who presents to the ED with pelvic pain and fullness which began a week ago.Patient states the pain got worse this morning around her lower abdomen.She describes the pain as a fullness mainly on her left pelvic region.She states she saw "something coming out" when using the restroom. Patient is currently not sexually active.She had one vaginal delivery but has not had a hysterectomy. Patient denies any vaginal bleeding,discharge, urinary symptoms, abdominal pain or chest pain, SOB.       Past Medical History:  Diagnosis Date  . 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 Bicipital tendonitis of left shoulder 09/2014  . Cataract, immature   . COPD (chronic obstructive pulmonary disease) ([place])   . Hypertension    states under control with meds., has been on med. > 10 yr.  . Impingement syndrome of left shoulder region 09/2014  . On home oxygen therapy    at night 2L/min  . Rotator cuff strain 09/2014   left  . Shortness of breath dyspnea    with exertion  . Thin skin   . Wears dentures    upper  . Wears partial dentures    lower    Patient Active Problem List   Diagnosis Date Noted  . Cor pulmonale, chronic ([place]) 06/29/2017  . CAP (community acquired pneumonia) 01/22/2015  . Chronic respiratory failure with hypoxia ([place]) 06/30/2014  . Pulmonary infiltrates 01/26/2013  . Nodule of right lung 08/19/2012  . Cancer screening 05/12/2012  . COPD exacerbation ([place]) 05/12/2012  . Essential hypertension 03/28/2012  . COPD GOLD III 12/02/2011  . Chronic cough 12/02/2011  . Cigarette smoker 12/02/2011    Past Surgical History:  Procedure Laterality Date  . NO PAST SURGERIES      . RESECTION DISTAL CLAVICAL Left 10/12/2014   Procedure: DISTAL CLAVICLE EXCISION;  Surgeon: Kathryne Hitch, MD;  Location: [facility];  Service: Orthopedics;  Laterality: Left;  . SHOULDER ARTHROSCOPY WITH ROTATOR CUFF REPAIR AND SUBACROMIAL DECOMPRESSION Left 10/12/2014   Procedure: LEFT SHOULDER ARTHROSCOPY, DEBRIDEMENT WITH ACROMIOPLASTY,  ROTATOR CUFF REPAIR AND RELEASE BICEPS TENDON;  Surgeon: Kathryne Hitch, MD;  Location: [facility];  Service: Orthopedics;  Laterality: Left;     OB History   None      Home Medications    Prior to Admission medications   Medication Sig Start Date End Date Taking? Authorizing Provider  albuterol (PROAIR HFA) 108 (90 Base) MCG/ACT inhaler 2 puffs every 4 hours as needed only  if your can't catch your breath 03/27/17  Yes Tanda Rockers, MD  Ascorbic Acid (VITAMIN C WITH ROSE HIPS) 500 MG tablet Take 500 mg by mouth daily.   Yes [provider]  budesonide-formoterol (SYMBICORT) 160-4.5 MCG/ACT inhaler Inhale 2 puffs into the lungs daily. 09/28/17  Yes Tanda Rockers, MD  buPROPion [facility] SR) 150 MG 12 hr tablet Take 1 tablet by mouth daily.  05/26/17  Yes [provider]  cholecalciferol (VITAMIN D) 1000 UNITS tablet Take 1,000 Units by mouth daily.   Yes [provider]  diltiazem (CARDIZEM CD) 360 MG 24 hr capsule TK ONE C PO QAM 12/05/17  Yes [provider]  furosemide (LASIX) 20 MG tablet Take 20 mg by mouth daily as needed for fluid.   Yes [provider]  losartan (COZAAR) 100 MG tablet Take 100 mg by mouth daily. 12/10/15  Yes [provider]  Misc Natural Products (GLUCOSAMINE CHOND COMPLEX/MSM) TABS Take 1 tablet by mouth daily.   Yes [provider]  Multiple Minerals (CALCIUM-MAGNESIUM-ZINC) TABS Take 1 tablet by mouth daily.   Yes [provider]  OXYGEN Inhale 2 L into the lungs at bedtime.   Yes [provider]  potassium  chloride (K-DUR) 10 MEQ tablet Take 1 tablet by mouth daily as needed (swelling). When takes lasix  10/13/17  Yes [provider]  Tiotropium Bromide Monohydrate (SPIRIVA RESPIMAT) 2.5 MCG/ACT AERS INHALE 2 PUFFS INTO THE LUNGS EVERY MORNING 10/19/17  Yes Tanda Rockers, MD  albuterol (PROVENTIL) (2.5 MG/3ML) 0.083% nebulizer solution Take 3 mLs (2.5 mg total) by nebulization every 6 (six) hours as needed for wheezing or shortness of breath. 10/08/15   Tanda Rockers, MD  ondansetron (ZOFRAN) 4 MG tablet Take 1 tablet (4 mg total) by mouth every 8 (eight) hours as needed for nausea or vomiting. 01/22/15   Tanda Rockers, MD  Respiratory Therapy Supplies (FLUTTER) DEVI Use as directed 02/05/16   Tanda Rockers, MD    Family History Family History  Problem Relation Age of Onset  . Heart disease Father   . Kidney failure Father     Social History Social History   Tobacco Use  . Smoking status: Former Smoker    Packs/day: 1.00    Years: 57.00    Pack years: 57.00    Types: Cigarettes    Last attempt to quit: 03/20/2017    Years since quitting: 0.7  . Smokeless tobacco: Never Used  . Tobacco comment: <1ppd 02/05/2016  Substance Use Topics  . Alcohol use: Yes    Alcohol/week: 0.0 oz    Comment: several beers/day and "a shot of whiskey"  . Drug use: No     Allergies   Tramadol   Review of Systems Review of Systems  Respiratory: Negative for shortness of breath.   Cardiovascular: Negative for chest pain.  Gastrointestinal: Negative for abdominal pain, constipation, diarrhea, nausea and vomiting.  Genitourinary: Positive for pelvic pain (described as fullness). Negative for flank pain, vaginal bleeding and vaginal discharge.  Musculoskeletal: Negative for back pain.  Neurological: Negative for headaches.  All other systems reviewed and are negative.    Physical Exam Updated Vital Signs BP (!) 155/75   Pulse 80   Temp 98.1 F (36.7 C) (Oral)   Resp 15   Ht _0   (1.575 m)   Wt 49 kg (108 lb)   SpO2 94%   BMI 19.75 kg/m   Physical Exam  Constitutional: She is oriented to person, place, and time. She appears well-developed and well-nourished.  HENT:  Head: Normocephalic and atraumatic.  Cardiovascular: Normal heart sounds.  Pulmonary/Chest: Effort normal. No stridor. She has no wheezes.  Lungs sounds diminished BL.Hx: COPD  Abdominal: Soft. Bowel sounds are normal. She exhibits no distension. There is tenderness in the left lower quadrant.    Genitourinary: Vagina normal. Pelvic exam was performed with patient supine. Cervix exhibits no motion tenderness and no discharge. No erythema, tenderness or bleeding in the vagina. No vaginal discharge found.  Genitourinary Comments: A visible mass seen on valsalva. Mass retracts.   Musculoskeletal: She exhibits no tenderness.  Neurological: She  is alert and oriented to person, place, and time.  Nursing note and vitals reviewed.    ED Treatments / Results  Labs (all labs ordered are listed, but only abnormal results are displayed) Labs Reviewed  URINALYSIS, ROUTINE W REFLEX MICROSCOPIC - Abnormal; Notable for the following components:      Result Value   APPearance HAZY (*)    Leukocytes, UA MODERATE (*)    WBC, UA >50 (*)    Bacteria, UA RARE (*)    Non Squamous Epithelial 11-20 (*)    All other components within normal limits  URINE CULTURE    EKG None  Radiology US Pelvis Transvanginal Non-ob (tv Only)  Result Date: 12/12/2017 CLINICAL DATA:  Left-sided pelvic pain for the past 2 days. EXAM: TRANSABDOMINAL AND TRANSVAGINAL ULTRASOUND OF PELVIS TECHNIQUE: Both transabdominal and transvaginal ultrasound examinations of the pelvis were performed. Transabdominal technique was performed for global imaging of the pelvis including uterus, ovaries, adnexal regions, and pelvic cul-de-sac. It was necessary to proceed with endovaginal exam following the transabdominal exam to visualize the uterus,  endometrium, and ovaries. COMPARISON:  PET-CT dated September 20, 2012. FINDINGS: Uterus Measurements: 4.0 x 2.2 x 3.2 cm. No fibroids or other mass visualized. Endometrium Thickness: 4 mm. No focal abnormality visualized. Trace fluid in the endometrial canal. Right ovary Measurements: 2.5 x 1.6 x 1.5 cm. Small 1.2 x 0.7 x 0.8 cm simple cyst in the right ovary. Left ovary Measurements: 2.6 x 1.4 x 1.5 cm. Normal appearance/no adnexal mass. Other findings No abnormal free fluid. IMPRESSION: 1. No acute abnormality. 2. Small 1.2 cm simple cyst in the right ovary. This is almost certainly benign, but follow up ultrasound is recommended in 1 year according to the Society of Radiologists in [place] Statement (D Clovis Riley et al. Management of Asymptomatic Ovarian and Other Adnexal Cysts Imaged at [place]: Society of Radiologists in [place] Statement 2010. Radiology 256 (Sept 2010): 956-213.). Electronically Signed   By: Titus Dubin M.D.   On: 12/12/2017 19:22   US Pelvis Complete  Result Date: 12/12/2017 CLINICAL DATA:  Left-sided pelvic pain for the past 2 days. EXAM: TRANSABDOMINAL AND TRANSVAGINAL ULTRASOUND OF PELVIS TECHNIQUE: Both transabdominal and transvaginal ultrasound examinations of the pelvis were performed. Transabdominal technique was performed for global imaging of the pelvis including uterus, ovaries, adnexal regions, and pelvic cul-de-sac. It was necessary to proceed with endovaginal exam following the transabdominal exam to visualize the uterus, endometrium, and ovaries. COMPARISON:  PET-CT dated September 20, 2012. FINDINGS: Uterus Measurements: 4.0 x 2.2 x 3.2 cm. No fibroids or other mass visualized. Endometrium Thickness: 4 mm. No focal abnormality visualized. Trace fluid in the endometrial canal. Right ovary Measurements: 2.5 x 1.6 x 1.5 cm. Small 1.2 x 0.7 x 0.8 cm simple cyst in the right ovary. Left ovary Measurements: 2.6 x 1.4 x 1.5 cm. Normal  appearance/no adnexal mass. Other findings No abnormal free fluid. IMPRESSION: 1. No acute abnormality. 2. Small 1.2 cm simple cyst in the right ovary. This is almost certainly benign, but follow up ultrasound is recommended in 1 year according to the Society of Radiologists in [place] Statement (D Clovis Riley et al. Management of Asymptomatic Ovarian and Other Adnexal Cysts Imaged at [place]: Society of Radiologists in [place] Statement 2010. Radiology 256 (Sept 2010): 086-578.). Electronically Signed   By: Titus Dubin M.D.   On: 12/12/2017 19:22    Procedures Procedures (including critical care time)  Medications Ordered in ED Medications -  No data to display   Initial Impression / Assessment and Plan / ED Course  I have reviewed the triage vital signs and the nursing notes.  Pertinent labs & imaging results that were available during my care of the patient were reviewed by me and considered in my medical decision making (see chart for details).     Patient in ED with pelvic pain and vaginal fullness x 1 week, symptoms worse yesterday. Patient is currently not sexually active and has not had hysterectomy. Patient denies any urinary symptoms or vaginal bleeding. Upon examination of the patient with PA Alyse Low a small mass protrude out upon valsalva. Pelvic examination was performed. Urine analaysis was ordered to r/o infection. Urine analysis results showed WBC >50, and a moderate amount of leukocytes. Will treat patient with keflex x 7 days..I have given patient a referral for the [facility]'s clinic at [facility] and advised to make an appointment.No further emergent workup needed at this time.   Final Clinical Impressions(s) / ED Diagnoses   Final diagnoses:  Pelvic pain in female  Vaginal prolapse    ED Discharge Orders    None       Janeece Fitting, Hershal Coria 12/12/17 2013    Malvin Johns, MD 12/12/17 2016

## 2017-12-15 LAB — URINE CULTURE: SPECIAL REQUESTS: NORMAL

## 2017-12-16 ENCOUNTER — Telehealth: Payer: Self-pay | Admitting: *Deleted

## 2017-12-16 NOTE — Telephone Encounter (Signed)
Post ED Visit - Positive Culture Follow-up: Successful Patient Follow-Up  Culture assessed and recommendations reviewed by:  _0  Elenor Quinones, Pharm.D. _1  Heide Guile, Pharm.D., BCPS AQ-ID _2  Parks Neptune, Pharm.D., BCPS _3  Alycia Rossetti, Pharm.D., BCPS _4  [place], [place].D., BCPS, AAHIVP _5  Legrand Como, Pharm.D., BCPS, AAHIVP _6  Salome Arnt, PharmD, BCPS _7  Johnnette Gourd, PharmD, BCPS _8  Hughes Better, PharmD, BCPS _9  Leeroy Cha, PharmD  Positive urine culture  _10  Patient discharged without antimicrobial prescription and treatment is now indicated _11  Organism is resistant to prescribed ED discharge antimicrobial _12  Patient with positive blood cultures  Changes discussed with ED provider Geanie Kenning, PA-C D/C Keflex, no further treatment necessary   Contacted patient, date 12/16/2017 at 0935, left message with instructions to D/C Keflex.   Harlon Flor [facility] 12/16/2017, 9:37 AM

## 2018-01-04 DIAGNOSIS — S82091A Other fracture of right patella, initial encounter for closed fracture: Secondary | ICD-10-CM

## 2018-01-04 DIAGNOSIS — S82092A Other fracture of left patella, initial encounter for closed fracture: Secondary | ICD-10-CM

## 2018-01-04 HISTORY — DX: Other fracture of left patella, initial encounter for closed fracture: S82.092A

## 2018-01-04 HISTORY — DX: Other fracture of right patella, initial encounter for closed fracture: S82.091A

## 2018-01-06 ENCOUNTER — Encounter: Payer: Medicare Other | Admitting: Obstetrics and Gynecology

## 2018-01-29 ENCOUNTER — Telehealth: Payer: Self-pay | Admitting: Internal Medicine

## 2018-01-29 NOTE — Telephone Encounter (Signed)
Called and spoke with Patient.  Patient stated that she is having bladder surgery 02/11/18 and Dr Willis Modena had requested her get surgical clearance from Pulmonary.  Appointment made 02/03/18 at 2:15.  Nothing further at this time.

## 2018-02-02 ENCOUNTER — Telehealth: Payer: Self-pay | Admitting: Internal Medicine

## 2018-02-02 NOTE — Telephone Encounter (Signed)
I have checked MW's look at, up front folders and this document is not located in either location.  Spoke with Maudie Mercury at [facility]. Advised her that this will need to be refaxed to [place]. Will await fax.

## 2018-02-03 ENCOUNTER — Encounter: Payer: Self-pay | Admitting: Internal Medicine

## 2018-02-03 ENCOUNTER — Ambulatory Visit (INDEPENDENT_AMBULATORY_CARE_PROVIDER_SITE_OTHER): Payer: Medicare Other | Admitting: Internal Medicine

## 2018-02-03 ENCOUNTER — Ambulatory Visit (INDEPENDENT_AMBULATORY_CARE_PROVIDER_SITE_OTHER)
Admission: RE | Admit: 2018-02-03 | Discharge: 2018-02-03 | Disposition: A | Discharge status: Home / Self Care | Payer: Medicare Other | Source: Ambulatory Visit | Attending: Internal Medicine | Admitting: Internal Medicine

## 2018-02-03 ENCOUNTER — Encounter (HOSPITAL_BASED_OUTPATIENT_CLINIC_OR_DEPARTMENT_OTHER): Payer: Self-pay | Admitting: *Deleted

## 2018-02-03 VITALS — BP 126/66 | HR 86 | Ht 62.0 in | Wt 107.8 lb

## 2018-02-03 DIAGNOSIS — J449 Chronic obstructive pulmonary disease, unspecified: Secondary | ICD-10-CM | POA: Diagnosis not present

## 2018-02-03 DIAGNOSIS — J9611 Chronic respiratory failure with hypoxia: Secondary | ICD-10-CM | POA: Diagnosis not present

## 2018-02-03 MED ORDER — TIOTROPIUM BROMIDE MONOHYDRATE 2.5 MCG/ACT IN AERS: 2.0000 | INHALATION_SPRAY | Freq: Every day | RESPIRATORY_TRACT | 0 refills | Status: DC

## 2018-02-03 MED ORDER — BUDESONIDE-FORMOTEROL FUMARATE 160-4.5 MCG/ACT IN AERO: 2.0000 | INHALATION_SPRAY | Freq: Two times a day (BID) | RESPIRATORY_TRACT | 0 refills | Status: DC

## 2018-02-03 NOTE — Patient Instructions (Addendum)
Work on inhaler technique:  relax and gently blow all the way out then take a nice smooth deep breath back in, triggering the inhaler at same time you start breathing in.  Hold for up to 5 seconds if you can. Blow symbicort out thru nose. Rinse and gargle with water when done     Use your nebulizer the morning of your surgery    Please remember to go to the  x-ray department downstairs in the basement  for your tests - we will call you with the results when they are available and clear you  For bladder surgery then   Please schedule a follow up visit in 3 months but call sooner if needed

## 2018-02-03 NOTE — Progress Notes (Signed)
Subjective:   Patient ID: [name], female    DOB: 08/13/1945   MRN: [id number]  Brief patient profile:  46 yowf quit smoking 03/2017  followed in pulmonary clinic for GOLD III copd   History of Present Illness  11/05/2015  f/u ov/[name] re: GOLD III / still smoking / maint rx symb/spriiva dpi  Chief Complaint  Patient presents with  . Follow-up    pt states breathing is baseline. c/o cont SOB, prod cough tan to greenish in color, wheezing, cp/tightness  dyspnea is variable and has portable tanks but never uses them to see if makes any difference in her variable doe / using saba up to 4 x daily instead.   rec You are qualified for portable 02 if you wish but no need to wear it at rest or walking around the house - continue it at bedtime thru the concentrator at 2lpm and wear the portable system as needed during the day Ok to finish up the powder spiriva and just refill the respimat    12/24/2016  f/u ov/[name] re:  COPD GOLD III/ still smoking/ symb/spiriva / 02 2lpm hs only  Chief Complaint  Patient presents with  . Follow-up    Pt states she is still having increase sob with exerton, she still has a slight cough with clear mucus with a tint of blood, lots of wheezing,Denies chest tightness,fever   using saba hfa up to twice daily but no needing noct Doe = MMRC3 = can't walk 100 yards even at a slow pace at a flat grade s stopping due to sob   Has slt bloody nasal discharge in am and scant blood streaked sputum also, just in am and just when nose is bleeding too   03/27/2017  f/u ov/[name] re:  Copd III / 02 hs 2lpm / never using amb 02  Doe = MMRC 3 still off 02  Sleeping on 2lpm with typical gurgling cough in am > sev tsp of thick white mucus/ occ streaks of blood rec Work on inhaler technique:  relax and gently blow all the way out then take a nice smooth deep breath back in, triggering the inhaler at same time you start breathing in.  Hold for up to 5 seconds if you can. Blow  out thru nose. Rinse and gargle with water when done Change the ventolin to proair up to 2 pffs every 4 hours as needed     06/29/2017  f/u ov/[name] re:   GOLD III copd/ 02 hs only  Quit smoking 03/2017  Chief Complaint  Patient presents with  . Follow-up    PFT's done today.  Breathing is unchanged. She has been having some swelling in her legs and feet.   doe = MMRC3 = can't walk 100 yards even at a slow pace at a flat grade s stopping due to sob  rec Work on inhaler technique:  No change in medications  02 is 2lpm at bedtime  Talk to Dr Terrence Dupont re your water pill  Please schedule a follow up visit in 3 months       09/28/2017  f/u ov/[name] re: copd gold III/ 02 hs only, still off cigs / confused with names of meds  Chief Complaint  Patient presents with  . Follow-up    Breathing is doing about the same. She has been coughing up some yellow sputum for the past 2 days. She is using her albuterol inhaler 2-3 x per wk on average.  Dyspnea:  MMRC3 = can't walk 100 yards even at a slow pace at a flat grade s stopping due to sob  / no 02 Cough: congested, worse in am and more yellow x sev days prior to OV    Sleep: ok SABA use: rarely rec zpak  Take all medications to your doctors so they all have an accurate list of your medications Please schedule a follow up office visit in 6 weeks, call sooner if needed with pfts on return      11/11/2017  f/u ov/[name] re:  GOLD III/ 02 hs prn  Chief Complaint  Patient presents with  . Follow-up    Breathing is about the same. She is using her albuterol inhaler 2-3 x per wk on average and she rarely uses neb.   Dyspnea:   No change in doe = MMRC3  Cough: not much at all  Sleep: flat on 2lpm  SABA use:  As above Sleeping  Ok on 2lpm flat   rec Discuss the fluid pills with yDr Redmond Pulling but I will recommend he consider adding aldactone if not improving on lasix alone but have to be very careful with potassium monitoring with these changes so I  will defer to him  Please schedule a follow up visit in 6 months but call sooner if needed - referred to rehab    02/03/2018  f/u ov/[name] re:  GOLD III/ 02 hs only  - pre op clearance  Chief Complaint  Patient presents with  . Follow-up    sx clearance for bladder tack on 8/29.  Pt states her breathing is at baseline.    Dyspnea:  MMRC2 = can't walk a nl pace on a flat grade s sob but does fine slow and flat eg foodlion s 02  Cough: none Sleeping: 2lpm 1-2 pillows ok /min am congestion  SABA use: neither or hfa or neb  02: 2lpm hs only     No obvious day to day or daytime variability or assoc excess/ purulent sputum or mucus plugs or hemoptysis or cp or chest tightness, subjective wheeze or overt sinus or hb symptoms.   Sleeping as above  without nocturnal  exacerbation  of respiratory  c/o's or need for noct saba. Also denies any obvious fluctuation of symptoms with weather or environmental changes or other aggravating or alleviating factors except as outlined above   No unusual exposure hx or h/o childhood pna/ asthma or knowledge of premature birth.  Current Allergies, Complete Past Medical History, Past Surgical History, Family History, and Social History were reviewed in [employer] record.  ROS  The following are not active complaints unless bolded Hoarseness, sore throat, dysphagia, dental problems, itching, sneezing,  nasal congestion or discharge of excess mucus or purulent secretions, ear ache,   fever, chills, sweats, unintended wt loss or wt gain, classically pleuritic or exertional cp,  orthopnea pnd or arm/hand swelling  or leg swelling, presyncope, palpitations, abdominal pain, anorexia, nausea, vomiting, diarrhea  or change in bowel habits or change in bladder habits, change in stools or change in urine, dysuria, hematuria,  rash, arthralgias, visual complaints, headache, numbness, weakness or ataxia or problems with walking or coordination,  change in  mood or  memory.        Current Meds  Medication Sig  . albuterol (PROAIR HFA) 108 (90 Base) MCG/ACT inhaler 2 puffs every 4 hours as needed only  if your can't catch your breath  . albuterol (PROVENTIL) (2.5 MG/3ML)  0.083% nebulizer solution Take 3 mLs (2.5 mg total) by nebulization every 6 (six) hours as needed for wheezing or shortness of breath.  . Ascorbic Acid (VITAMIN C WITH ROSE HIPS) 500 MG tablet Take 500 mg by mouth daily.  . budesonide-formoterol (SYMBICORT) 160-4.5 MCG/ACT inhaler Inhale 2 puffs into the lungs daily.  Marland Kitchen buPROPion (WELLBUTRIN SR) 150 MG 12 hr tablet Take 1 tablet by mouth daily.   . cholecalciferol (VITAMIN D) 1000 UNITS tablet Take 1,000 Units by mouth daily.  Marland Kitchen diltiazem (CARDIZEM CD) 360 MG 24 hr capsule TK ONE C PO QAM  . furosemide (LASIX) 20 MG tablet Take 20 mg by mouth daily as needed for fluid.  Marland Kitchen losartan (COZAAR) 100 MG tablet Take 100 mg by mouth daily.  . Misc Natural Products (GLUCOSAMINE CHOND COMPLEX/MSM) TABS Take 1 tablet by mouth daily.  . Multiple Minerals (CALCIUM-MAGNESIUM-ZINC) TABS Take 1 tablet by mouth daily.  . nitrofurantoin (MACRODANTIN) 100 MG capsule Take 100 mg by mouth 2 (two) times daily.  . ondansetron (ZOFRAN) 4 MG tablet Take 1 tablet (4 mg total) by mouth every 8 (eight) hours as needed for nausea or vomiting.  . OXYGEN Inhale 2 L into the lungs at bedtime.  . potassium chloride (K-DUR) 10 MEQ tablet Take 1 tablet by mouth daily as needed (swelling). When takes lasix   . Respiratory Therapy Supplies (FLUTTER) DEVI Use as directed  . Tiotropium Bromide Monohydrate (SPIRIVA RESPIMAT) 2.5 MCG/ACT AERS INHALE 2 PUFFS INTO THE LUNGS EVERY MORNING          Objective:   Physical Exam    Thin pleasant amb wf nad   Vital signs reviewed - Note on arrival 02 sats  95% on RA     09/26/2013  101>102 10/11/2013 > 01/16/2014  99 > 04/06/2014  103 >105 06/30/2014 > 08/11/2014  101 > 11/09/2014  104 > 01/22/2015 106  > 02/06/2015 105 >  03/08/2015 106 > 04/18/2015  106 > 06/15/2015 103 > 09/14/2015   102 > 10/08/2015   103 > 11/05/2015  102 > 02/05/2016  103 > 05/14/2016 102 > 09/24/2016  99 > 12/24/2016  99 > 06/29/2017  104 > 09/28/2017  106 > 11/11/2017 104 > 02/03/2018  107        HEENT: nl oropharynx. Nl external ear canals without cough reflex -  Mild bilateral non-specific turbinate edema/ top denture/ bottom partial    NECK :  without JVD/Nodes/TM/ nl carotid upstrokes bilaterally   LUNGS: no acc muscle use,  Mod barrel  contour chest wall with bilateral  Distant bs with late exp wheeze but   without cough on insp or exp maneuver and mild  Hyperresonant  to  percussion bilaterally     CV:  RRR  no s3 or murmur or increase in P2, and no edema   ABD:  soft and nontender with pos late insp Hoover's  in the supine position. No bruits or organomegaly appreciated, bowel sounds nl  MS:   Nl gait/  ext warm without deformities, calf tenderness, cyanosis or clubbing R knee in splint   SKIN: warm and dry with chronic venous stasis changes both LE's     NEURO:  alert, approp, nl sensorium with  no motor or cerebellar deficits apparent.                      CXR PA and Lateral:   02/03/2018 :    I personally reviewed images and agree with  radiology impression as follows:      COPD changes with RIGHT upper lobe scarring.  No acute abnormalities.       Assessment & Plan:

## 2018-02-04 ENCOUNTER — Encounter: Payer: Self-pay | Admitting: Internal Medicine

## 2018-02-04 NOTE — Telephone Encounter (Signed)
Received the medical release for pt. MW signed and I will fax back to [facility]. Nothing furthe ris needed.   I will keep a copy in MW cubby if needed.

## 2018-02-04 NOTE — Assessment & Plan Note (Signed)
pfts 11/2011   FEV1 0.86 (42%) ratio 35  - 09/14/2015  hfa Arlana Pouch 11/05/2015 (when spiriva dpi supply runs out) - flutter valve added 02/05/2016  And recoached 05/14/2016  - PFT's  06/29/2017  FEV1 0.92 (44 % ) ratio 44  p 11 % improvement from saba p symb/spiriva prior to study with DLCO  33/33 % corrects to 35 % for alv volume   - 11/11/2017 referred to rehab  02/03/2018  After extensive coaching inhaler device,  effectiveness =    75% (short ti )   Despite suboptimal hfa she is well compensated at this point s significant active  AB component and can be cleared for bladder surgery with caveat that there is no "red line" in terms of an acceptable risk for non-thoracic surgery and intubation should be avoided if possible but advised pt the anesthesiologist has to be able to use this tool if needed in the course of the procedure and she would be at increased risk of post op resp complications with or without intubation so she needs to consider along with her surgeon if the benefit of the surgery is outweighed by those risks  For example, simple cosmetic surgery would not be worth the risk, and rupture aneurysm surgery would be approved immediately as the other outcome would be immediate death and bladder surgery is closer to cosmetic than AAA surgery on that scale  Discussed in detail all the  indications, usual  risks and alternatives  relative to the benefits with patient who will speak to her gynecologist and we can see her back in 3 months but sooner should she go thru with the surgery and have any post op complications  I don't have any pre op advise other than to use her saba neb prior to the surgery and can use duoneb periop prn    I had an extended discussion with the patient reviewing all relevant studies completed to date and  lasting 25 minutes of a 40  minute pre-op  Visit discussing above issues  See device teaching which extended face to face time for this visit   Each maintenance  medication was reviewed in detail including most importantly the difference between maintenance and prns and under what circumstances the prns are to be triggered using an action plan format that is not reflected in the computer generated alphabetically organized AVS.    Please see AVS for specific instructions unique to this office visit that I personally wrote and verbalized to the the pt in detail and then reviewed with pt  by my nurse highlighting any changes in therapy/plan of care  recommended at today's visit.

## 2018-02-04 NOTE — Assessment & Plan Note (Signed)
Exertional desats 06/30/2014 >O2 2lm rx with act  - 11/09/2014  Walked RA  2 laps @ 185 ft each stopped due to  Sat down to 88 % at nl pace but no sob  - ono RA  09/22/15  desat < 89% x 1:26mn > rec continue 02 2lpm - 41/16/5790certification: Patient Saturations on Room Air at Rest = 94% and while Ambulating 2 laps x 185 each= 87% Patient Saturations on 2Liters of oxygen while Ambulating =96% x one additional lap - 11/05/2015  Walked RA x 4 laps @ 185 ft each stopped due to  desat to 87% resolved on 2lpm   - 06/29/2017  Walked RA x 3 laps @ 185 ft each stopped due to  End of study/ slow pace, no desat - 11/11/2017  Walked RA x 3 laps @ 185 ft each stopped due to  End of study,  no  desat  < 90% at moderate to moderately  fast pace     rx as of 02/03/2018 = 2lpm hs and prn daytime

## 2018-02-08 NOTE — Progress Notes (Signed)
CBC WITH DIF, CMET, TSH, VITAMIND D 25 OH TOTAL LA LABS ON CHART DONE 01-20-18

## 2018-02-08 NOTE — Patient Instructions (Addendum)
Your procedure is scheduled on 02-11-18  Report to [facility] AM  Call this number if you have problems the morning of surgery  [phone].   OUR ADDRESS IS [place].  WE ARE LOCATED IN THE [place]                                   [facility].                                     REMEMBER:  DO NOT EAT FOOD OR DRINK LIQUIDS AFTER MIDNIGHT .  TAKE THESE MEDICATIONS MORNING OF SURGERY WITH A SIP OF WATER:   HYDROCODONE IF NEEDED, ALBUTEROL INHALER IF NEEDED AND BRING INHALER, ALBUTEROL NEBULIZER IF NEEDED, SYMBICORT, SPIRIVA, BUPROPIO (WELLBUTRIN), , DILTIAZEM (CARDIZEM CD)                                    DO NOT WEAR JEWERLY, MAKE UP, OR NAIL POLISH,  DO NOT WEAR LOTIONS, POWDERS, PERFUMES OR DEODORANT. DO NOT SHAVE FOR 24 HOURS PRIOR TO DAY OF SURGERY. MEN MAY SHAVE FACE AND NECK. CONTACTS, GLASSES, OR DENTURES MAY NOT BE WORN TO SURGERY.                                    [facility] IS NOT RESPONSIBLE  FOR ANY BELONGINGS.                                                                    Marland Kitchen                                                                                                    [place] - Preparing for Surgery Before surgery, you can play an important role.  Because skin is not sterile, your skin needs to be as free of germs as possible.  You can reduce the number of germs on your skin by washing with CHG (chlorahexidine gluconate) soap before surgery.  CHG is an antiseptic cleaner which kills germs and bonds with the skin to continue killing germs even after washing. Please DO NOT use if you have an allergy to CHG or antibacterial soaps.  If your skin becomes reddened/irritated stop using the CHG and inform your nurse when you arrive at Short Stay. Do not shave (including legs and underarms) for at least 48 hours prior to the first CHG shower.  You may shave your face/neck. Please  follow these instructions carefully:  1.   Shower with CHG Soap the night before surgery and the  morning of Surgery.  2.  If you choose to wash your hair, wash your hair first as usual with your  normal  shampoo.  3.  After you shampoo, rinse your hair and body thoroughly to remove the  shampoo.                           4.  Use CHG as you would any other liquid soap.  You can apply chg directly  to the skin and wash                       Gently with a scrungie or clean washcloth.  5.  Apply the CHG Soap to your body ONLY FROM THE NECK DOWN.   Do not use on face/ open                           Wound or open sores. Avoid contact with eyes, ears mouth and genitals (private parts).                       Wash face,  Genitals (private parts) with your normal soap.             6.  Wash thoroughly, paying special attention to the area where your surgery  will be performed.  7.  Thoroughly rinse your body with warm water from the neck down.  8.  DO NOT shower/wash with your normal soap after using and rinsing off  the CHG Soap.                9.  Pat yourself dry with a clean towel.            10.  Wear clean pajamas.            11.  Place clean sheets on your bed the night of your first shower and do not  sleep with pets. Day of Surgery : Do not apply any lotions/deodorants the morning of surgery.  Please wear clean clothes to the hospital/surgery center.  FAILURE TO FOLLOW THESE INSTRUCTIONS MAY RESULT IN THE CANCELLATION OF YOUR SURGERY PATIENT SIGNATURE_________________________________  NURSE SIGNATURE__________________________________  ________________________________________________________________________   [name]  An incentive spirometer is a tool that can help keep your lungs clear and active. This tool measures how well you are filling your lungs with each breath. Taking long deep breaths may help reverse or decrease the chance of developing breathing (pulmonary) problems (especially infection) following:  A long  period of time when you are unable to move or be active. BEFORE THE PROCEDURE   If the spirometer includes an indicator to show your best effort, your nurse or respiratory therapist will set it to a desired goal.  If possible, sit up straight or lean slightly forward. Try not to slouch.  Hold the incentive spirometer in an upright position. INSTRUCTIONS FOR USE  1. Sit on the edge of your bed if possible, or sit up as far as you can in bed or on a chair. 2. Hold the incentive spirometer in an upright position. 3. Breathe out normally. 4. Place the mouthpiece in your mouth and seal your lips tightly around it. 5. Breathe in slowly and as deeply as possible, raising the piston or the ball  toward the top of the column. 6. Hold your breath for 3-5 seconds or for as long as possible. Allow the piston or ball to fall to the bottom of the column. 7. Remove the mouthpiece from your mouth and breathe out normally. 8. Rest for a few seconds and repeat Steps 1 through 7 at least 10 times every 1-2 hours when you are awake. Take your time and take a few normal breaths between deep breaths. 9. The spirometer may include an indicator to show your best effort. Use the indicator as a goal to work toward during each repetition. 10. After each set of 10 deep breaths, practice coughing to be sure your lungs are clear. If you have an incision (the cut made at the time of surgery), support your incision when coughing by placing a pillow or rolled up towels firmly against it. Once you are able to get out of bed, walk around indoors and cough well. You may stop using the incentive spirometer when instructed by your caregiver.  RISKS AND COMPLICATIONS  Take your time so you do not get dizzy or light-headed.  If you are in pain, you may need to take or ask for pain medication before doing incentive spirometry. It is harder to take a deep breath if you are having pain. AFTER USE  Rest and breathe slowly and  easily.  It can be helpful to keep track of a log of your progress. Your caregiver can provide you with a simple table to help with this. If you are using the spirometer at home, follow these instructions: [place] IF:   You are having difficultly using the spirometer.  You have trouble using the spirometer as often as instructed.  Your pain medication is not giving enough relief while using the spirometer.  You develop fever of 100.5 F (38.1 C) or higher. SEEK IMMEDIATE MEDICAL CARE IF:   You cough up bloody sputum that had not been present before.  You develop fever of 102 F (38.9 C) or greater.  You develop worsening pain at or near the incision site. MAKE SURE YOU:   Understand these instructions.  Will watch your condition.  Will get help right away if you are not doing well or get worse. Document Released: 10/13/2006 Document Revised: 08/25/2011 Document Reviewed: 12/14/2006 [facility] Patient Information 2014 [place], [place].   ________________________________________________________________________

## 2018-02-09 ENCOUNTER — Encounter (HOSPITAL_COMMUNITY): Payer: Self-pay

## 2018-02-09 ENCOUNTER — Other Ambulatory Visit: Payer: Self-pay

## 2018-02-09 ENCOUNTER — Encounter (HOSPITAL_COMMUNITY)
Admission: RE | Admit: 2018-02-09 | Discharge: 2018-02-09 | Disposition: A | Discharge status: Home / Self Care | Payer: Medicare Other | Source: Ambulatory Visit | Attending: Obstetrics and Gynecology | Admitting: Obstetrics and Gynecology

## 2018-02-09 DIAGNOSIS — I10 Essential (primary) hypertension: Secondary | ICD-10-CM | POA: Insufficient documentation

## 2018-02-09 DIAGNOSIS — Z01818 Encounter for other preprocedural examination: Secondary | ICD-10-CM | POA: Insufficient documentation

## 2018-02-09 DIAGNOSIS — N8110 Cystocele, unspecified: Secondary | ICD-10-CM | POA: Insufficient documentation

## 2018-02-09 HISTORY — DX: Atherosclerotic heart disease of native coronary artery without angina pectoris: I25.10

## 2018-02-09 HISTORY — DX: Pneumonia, unspecified organism: J18.9

## 2018-02-09 HISTORY — DX: Other fracture of right patella, initial encounter for closed fracture: S82.091A

## 2018-02-09 HISTORY — DX: Spontaneous ecchymoses: R23.3

## 2018-02-09 HISTORY — DX: Other injury of unspecified body region, initial encounter: T14.8XXA

## 2018-02-09 HISTORY — DX: Other skin changes: R23.8

## 2018-02-09 HISTORY — DX: Pulmonary hypertension, unspecified: I27.20

## 2018-02-09 NOTE — Progress Notes (Signed)
Spoke with dr Herbie Baltimore fitzgerald and made aware patient on home oxygen 2 liters at hs and respiratory history, per [facility] guidelines patient cannot have surgery at [facility] due to home oxygen dependence per dr Herbie Baltimore fitzgerald anesthesia. Patient aware surgery cannot de done at [facility].

## 2018-02-09 NOTE — Progress Notes (Signed)
Called [name] at dr meisinger office and made aware patient cannot have surgery at [facility] due to home oxygen dependence per dr Herbie Baltimore fitzgerald anestheia and patient refused to sign surgical consent stating she had told dr Willis Modena she did not want to have a hysterectomy. Requested cardiac records via fax for patient from dr Terrence Dupont

## 2018-02-16 ENCOUNTER — Telehealth: Payer: Self-pay | Admitting: Internal Medicine

## 2018-02-16 MED ORDER — BUDESONIDE-FORMOTEROL FUMARATE 160-4.5 MCG/ACT IN AERO: 2.0000 | INHALATION_SPRAY | Freq: Two times a day (BID) | RESPIRATORY_TRACT | 2 refills | Status: DC

## 2018-02-16 NOTE — Patient Instructions (Signed)
Your procedure is scheduled on: Tuesday March 02, 2018 at 7:30 am  Enter through the Main Entrance of [facility] at: 6:00 am  Pick up the phone at the desk and dial [phone].  Call this number if you have problems the morning of surgery: [phone].  Remember: Do NOT eat food or drink any liquids after: Midnight Monday September 16  Take these medicines the morning of surgery with a SIP OF WATER:  STOP ALL VITAMINS, SUPPLEMENTS, HERBAL MEDICATIONS NOW  DO NOT SMOKE DAY OF SURGERY  BRUSH YOUR TEETH DAY OF SURGERY  Do NOT wear jewelry (body piercing), metal hair clips/bobby pins, make-up, or nail polish. Do NOT wear lotions, powders, or perfumes.  You may wear deoderant. Do NOT shave for 48 hours prior to surgery. Do NOT bring valuables to the hospital. Contacts, dentures, or bridgework may not be worn into surgery. Leave suitcase in car.  After surgery it may be brought to your room.  For patients admitted to the hospital, checkout time is 11:00 AM the day of discharge. Have a responsible adult drive you home and stay with you for 24 hours after your procedure

## 2018-02-16 NOTE — Telephone Encounter (Signed)
Rx for Symbicort 160 has been sent to preferred pharmacy. [facility] has been made aware. Nothing further is needed.

## 2018-02-17 ENCOUNTER — Encounter (HOSPITAL_COMMUNITY)
Admission: RE | Admit: 2018-02-17 | Discharge: 2018-02-17 | Disposition: A | Discharge status: Home / Self Care | Payer: Medicare Other | Source: Ambulatory Visit | Attending: Obstetrics and Gynecology | Admitting: Obstetrics and Gynecology

## 2018-02-17 ENCOUNTER — Encounter (HOSPITAL_COMMUNITY): Payer: Self-pay

## 2018-02-17 ENCOUNTER — Other Ambulatory Visit: Payer: Self-pay

## 2018-02-17 DIAGNOSIS — Z79899 Other long term (current) drug therapy: Secondary | ICD-10-CM | POA: Insufficient documentation

## 2018-02-17 DIAGNOSIS — J449 Chronic obstructive pulmonary disease, unspecified: Secondary | ICD-10-CM | POA: Diagnosis not present

## 2018-02-17 DIAGNOSIS — Z01812 Encounter for preprocedural laboratory examination: Secondary | ICD-10-CM | POA: Insufficient documentation

## 2018-02-17 HISTORY — DX: Other fracture of left patella, initial encounter for closed fracture: S82.092A

## 2018-02-17 HISTORY — DX: Urinary tract infection, site not specified: N39.0

## 2018-02-17 HISTORY — DX: Unilateral inguinal hernia, without obstruction or gangrene, not specified as recurrent: K40.90

## 2018-02-17 HISTORY — DX: Disease of blood and blood-forming organs, unspecified: D75.9

## 2018-02-17 LAB — CBC
HCT: 42.7 % (ref 36.0–46.0)
Hemoglobin: 13.8 g/dL (ref 12.0–15.0)
MCH: 30.3 pg (ref 26.0–34.0)
MCHC: 32.3 g/dL (ref 30.0–36.0)
MCV: 93.6 fL (ref 78.0–100.0)
PLATELETS: 313 10*3/uL (ref 150–400)
RBC: 4.56 MIL/uL (ref 3.87–5.11)
RDW: 14.5 % (ref 11.5–15.5)
WBC: 9 10*3/uL (ref 4.0–10.5)

## 2018-02-17 LAB — COMPREHENSIVE METABOLIC PANEL
ALT: 17 U/L (ref 0–44)
ANION GAP: 13 (ref 5–15)
AST: 22 U/L (ref 15–41)
Albumin: 4.3 g/dL (ref 3.5–5.0)
Alkaline Phosphatase: 58 U/L (ref 38–126)
BUN: 20 mg/dL (ref 8–23)
CHLORIDE: 100 mmol/L (ref 98–111)
CO2: 24 mmol/L (ref 22–32)
Calcium: 9.5 mg/dL (ref 8.9–10.3)
Creatinine, Ser: 0.57 mg/dL (ref 0.44–1.00)
Glucose, Bld: 96 mg/dL (ref 70–99)
POTASSIUM: 4.7 mmol/L (ref 3.5–5.1)
SODIUM: 137 mmol/L (ref 135–145)
Total Bilirubin: 0.4 mg/dL (ref 0.3–1.2)
Total Protein: 7.4 g/dL (ref 6.5–8.1)

## 2018-02-17 NOTE — Pre-Procedure Instructions (Signed)
Dr. Candida Peeling viewed EKG, aware of history. Okay for surgery

## 2018-02-17 NOTE — Patient Instructions (Signed)
Your procedure is scheduled on: Tuesday March 02, 2018 at 7:30 am  Enter through the Main Entrance of [facility] at: 6:00 am  Pick up the phone at the desk and dial [phone].  Call this number if you have problems the morning of surgery: [phone].  Remember: Do NOT eat food or drink any liquids after: Midnight on Monday September 16   Take these medicines the morning of surgery with a SIP OF WATER: WELLBUTRIN, DILTIAZEM, MAY TAKE ZOFRAN IF NEEDED. NEBULIZER TREATMENT,  SYMBORT INHALER, SPIR VA INHALER  BRING ALBUTEROL INHALER WITH YOU DAY OF SURGERY    STOP ALL VITAMINS, SUPPLEMENTS, HERBAL MEDICATIONS NOW  DO NOT SMOKE DAY OF SURGERY  BRUSH YOUR TEETH DAY OF SURGERY  Do NOT wear jewelry (body piercing), metal hair clips/bobby pins, make-up, or nail polish. Do NOT wear lotions, powders, or perfumes.  You may wear deoderant. Do NOT shave for 48 hours prior to surgery. Do NOT bring valuables to the hospital. Contacts, dentures, or bridgework may not be worn into surgery. Leave suitcase in car.  After surgery it may be brought to your room.    For patients admitted to the hospital, checkout time is 11:00 AM the day of discharge.  IF DISCHARGED DAY OF SURGERY YOU WILL NEED A COMITANT ADULT TO DRIVE YOU HOME AND STAY WITH YOU FOR 24 HOURS AFTER YOUR SURGERY  Have a responsible adult drive you home and stay with you for 24 hours after your procedure

## 2018-03-01 NOTE — Anesthesia Preprocedure Evaluation (Addendum)
Anesthesia Evaluation  Patient identified by MRN, date of birth, ID band Patient awake    Reviewed: Allergy & Precautions, NPO status , Patient's Chart, lab work & pertinent test results  Airway Mallampati: II  TM Distance: >3 FB Neck ROM: Full    Dental no notable dental hx. (+) Edentulous Upper, Partial Lower, Dental Advisory Given   Pulmonary shortness of breath, pneumonia, COPD,  COPD inhaler, former smoker,    Pulmonary exam normal breath sounds clear to auscultation       Cardiovascular hypertension, + CAD  negative cardio ROS Normal cardiovascular exam Rhythm:Regular Rate:Normal     Neuro/Psych negative neurological ROS  negative psych ROS   GI/Hepatic   Endo/Other    Renal/GU      Musculoskeletal   Abdominal   Peds  Hematology  (+) Blood dyscrasia, ,   Anesthesia Other Findings   Reproductive/Obstetrics                            Lab Results  Component Value Date   WBC 9.0 02/17/2018   HGB 13.8 02/17/2018   HCT 42.7 02/17/2018   MCV 93.6 02/17/2018   PLT 313 02/17/2018   Lab Results  Component Value Date   CREATININE 0.57 02/17/2018   BUN 20 02/17/2018   NA 137 02/17/2018   K 4.7 02/17/2018   CL 100 02/17/2018   CO2 24 02/17/2018    Anesthesia Physical Anesthesia Plan  ASA: II  Anesthesia Plan: Spinal   Post-op Pain Management:    Induction:   PONV Risk Score and Plan: 3 and Treatment may vary due to age or medical condition, Dexamethasone and Ondansetron  Airway Management Planned: Natural Airway and Nasal Cannula  Additional Equipment:   Intra-op Plan:   Post-operative Plan:   Informed Consent: I have reviewed the patients History and Physical, chart, labs and discussed the procedure including the risks, benefits and alternatives for the proposed anesthesia with the patient or authorized representative who has indicated his/her understanding and  acceptance.   Dental advisory given  Plan Discussed with: CRNA  Anesthesia Plan Comments:        Anesthesia Quick Evaluation

## 2018-03-01 NOTE — H&P (Signed)
[name] is an 72 y.o. female. She was seen by Dr. Terri [name] in July as a new patient  to discuss pelvic organ prolapse. Pt states was at [facility] on 6/29 due to pelvic pressure and was told had a bladder prolapse. She is not sexually active. She reports pressure was associated with pain in her left lower quadrant.  Pertinent Gynecological History: Last mammogram: normal Date: 12/2017 Last pap: normal OB History: G1, P1001   Menstrual History: No LMP recorded. Patient is postmenopausal.    Past Medical History:  Diagnosis Date  . Abrasion    under left knee x 2 places changing dressing q day of bid, applying ssd cream area healing due to fell 2 weeks ago  . Blood dyscrasia    BLEEDS EASY  . Bruises easily    bleeds easily  . Closed patellar sleeve fracture of left knee 01/04/2018  . Closed patellar sleeve fracture of right knee 01/04/2018   healing   . COPD (chronic obstructive pulmonary disease) ([place])   . Coronary artery disease    per dr [name] note 01-01-18 note  . Hernia, inguinal    LEFT  . Hypertension    states under control with meds., has been on med. > 10 yr.  . On home oxygen therapy    at night 2L/min  . Pneumonia    "SEVERAL TIMES IN PAST, LAST TIME 2018"  . Pulmonary hypertension ([place])    per dr [name] 7-19 19 lov note  . Shortness of breath dyspnea    with exertion  . Thin skin   . Urinary tract infection 2019  . Wears partial dentures    lower    Past Surgical History:  Procedure Laterality Date  . RESECTION DISTAL CLAVICAL Left 10/12/2014   Procedure: DISTAL CLAVICLE EXCISION;  Surgeon: Crystal Hitch, MD;  Location: [place];  Service: Orthopedics;  Laterality: Left;  . SHOULDER ARTHROSCOPY WITH ROTATOR CUFF REPAIR AND SUBACROMIAL DECOMPRESSION Left 10/12/2014   Procedure: LEFT SHOULDER ARTHROSCOPY, DEBRIDEMENT WITH ACROMIOPLASTY,  ROTATOR CUFF REPAIR AND RELEASE BICEPS TENDON;  Surgeon: Crystal Hitch, MD;  Location: [place];  Service: Orthopedics;  Laterality: Left;    Family History  Problem Relation Age of Onset  . Heart disease Father   . Kidney failure Father     Social History:  reports that she quit smoking about a year ago. Her smoking use included cigarettes. She has a 57.00 pack-year smoking history. She has never used smokeless tobacco. She reports that she drinks about 7.0 standard drinks of alcohol per week. She reports that she does not use drugs.  Allergies:  Allergies  Allergen Reactions  . Tramadol Other (See Comments)    GI UPSET    No medications prior to admission.    Review of Systems  Cardiovascular: Negative.     There were no vitals taken for this visit. Physical Exam  Constitutional: She appears well-developed and well-nourished.  Cardiovascular: Normal rate, regular rhythm and normal heart sounds.  No murmur heard. Respiratory: Effort normal and breath sounds normal. No respiratory distress.  GI: Soft. She exhibits no distension and no mass. There is no tenderness.  Genitourinary: Uterus normal.  Genitourinary Comments: Gr III cystocele, no rectocele Does have uterine descensus    No results found for this or any previous visit (from the past 24 hour(s)).  No results found.  Assessment/Plan: Symptomatic cystocele with uterine descensus.  Has also had some urinary symptoms.  All medical and surgical options have been discussed, including pessary, she wants surgery to fix cystocele only.  Surgical procedure, risks, chances of relieving her symptoms, risk of recurrent prolapse especially if all defects are not fixed, have all been discussed.  Will admit for anterior repair.  She has medical clearance from Dr. Melvyn [name] and Dr.Harwani.  [name] [name] 03/01/2018, 9:05 PM

## 2018-03-02 ENCOUNTER — Ambulatory Visit (HOSPITAL_COMMUNITY): Payer: Medicare Other | Admitting: Anesthesiology

## 2018-03-02 ENCOUNTER — Encounter (HOSPITAL_COMMUNITY)
Admission: RE | Disposition: A | Discharge status: Home / Self Care | Payer: Self-pay | Source: Ambulatory Visit | Attending: Obstetrics and Gynecology

## 2018-03-02 ENCOUNTER — Ambulatory Visit (HOSPITAL_COMMUNITY)
Admission: RE | Admit: 2018-03-02 | Discharge: 2018-03-03 | Disposition: A | Discharge status: Home / Self Care | Payer: Medicare Other | Source: Ambulatory Visit | Attending: Obstetrics and Gynecology | Admitting: Obstetrics and Gynecology

## 2018-03-02 ENCOUNTER — Other Ambulatory Visit: Payer: Self-pay

## 2018-03-02 ENCOUNTER — Encounter (HOSPITAL_COMMUNITY): Payer: Self-pay | Admitting: *Deleted

## 2018-03-02 DIAGNOSIS — Z79899 Other long term (current) drug therapy: Secondary | ICD-10-CM | POA: Diagnosis not present

## 2018-03-02 DIAGNOSIS — I2510 Atherosclerotic heart disease of native coronary artery without angina pectoris: Secondary | ICD-10-CM | POA: Insufficient documentation

## 2018-03-02 DIAGNOSIS — N814 Uterovaginal prolapse, unspecified: Secondary | ICD-10-CM | POA: Diagnosis present

## 2018-03-02 DIAGNOSIS — I10 Essential (primary) hypertension: Secondary | ICD-10-CM | POA: Diagnosis not present

## 2018-03-02 DIAGNOSIS — Z9981 Dependence on supplemental oxygen: Secondary | ICD-10-CM | POA: Insufficient documentation

## 2018-03-02 DIAGNOSIS — N8110 Cystocele, unspecified: Secondary | ICD-10-CM | POA: Diagnosis present

## 2018-03-02 DIAGNOSIS — I272 Pulmonary hypertension, unspecified: Secondary | ICD-10-CM | POA: Insufficient documentation

## 2018-03-02 DIAGNOSIS — J449 Chronic obstructive pulmonary disease, unspecified: Secondary | ICD-10-CM | POA: Diagnosis not present

## 2018-03-02 DIAGNOSIS — Z87891 Personal history of nicotine dependence: Secondary | ICD-10-CM | POA: Insufficient documentation

## 2018-03-02 HISTORY — PX: CYSTOCELE REPAIR: SHX163

## 2018-03-02 SURGERY — COLPORRHAPHY, ANTERIOR, FOR CYSTOCELE REPAIR
Anesthesia: Spinal

## 2018-03-02 MED ORDER — MOMETASONE FURO-FORMOTEROL FUM 200-5 MCG/ACT IN AERO: 2.0000 | INHALATION_SPRAY | Freq: Two times a day (BID) | RESPIRATORY_TRACT | Status: DC

## 2018-03-02 MED ORDER — ESTRADIOL 0.1 MG/GM VA CREA
TOPICAL_CREAM | VAGINAL | Status: DC | PRN
  Administered 2018-03-02: 1 via VAGINAL

## 2018-03-02 MED ORDER — LIDOCAINE HCL (CARDIAC) PF 100 MG/5ML IV SOSY
PREFILLED_SYRINGE | INTRAVENOUS | Status: AC
  Filled 2018-03-02: qty 5

## 2018-03-02 MED ORDER — ACETAMINOPHEN 500 MG PO TABS: 1000.0000 mg | ORAL_TABLET | Freq: Four times a day (QID) | ORAL | Status: DC | PRN

## 2018-03-02 MED ORDER — HYDROCODONE-ACETAMINOPHEN 7.5-325 MG PO TABS: 1.0000 | ORAL_TABLET | Freq: Once | ORAL | Status: DC | PRN

## 2018-03-02 MED ORDER — BUPROPION HCL ER (SR) 150 MG PO TB12
150.0000 mg | ORAL_TABLET | Freq: Every day | ORAL | Status: DC
  Administered 2018-03-03: 150 mg via ORAL
  Filled 2018-03-02 (×2): qty 1

## 2018-03-02 MED ORDER — OXYCODONE HCL 5 MG PO TABS
5.0000 mg | ORAL_TABLET | ORAL | Status: DC | PRN
  Administered 2018-03-02 – 2018-03-03 (×2): 5 mg via ORAL
  Filled 2018-03-02 (×2): qty 1

## 2018-03-02 MED ORDER — LOSARTAN POTASSIUM 50 MG PO TABS
100.0000 mg | ORAL_TABLET | Freq: Every day | ORAL | Status: DC
  Administered 2018-03-02 – 2018-03-03 (×2): 100 mg via ORAL
  Filled 2018-03-02 (×2): qty 2

## 2018-03-02 MED ORDER — HYDROMORPHONE HCL 1 MG/ML IJ SOLN: 0.2500 mg | INTRAMUSCULAR | Status: DC | PRN

## 2018-03-02 MED ORDER — DILTIAZEM HCL ER COATED BEADS 360 MG PO CP24
360.0000 mg | ORAL_CAPSULE | Freq: Every day | ORAL | Status: DC
  Administered 2018-03-03: 360 mg via ORAL
  Filled 2018-03-02: qty 1

## 2018-03-02 MED ORDER — ONDANSETRON HCL 4 MG PO TABS
4.0000 mg | ORAL_TABLET | Freq: Four times a day (QID) | ORAL | Status: DC | PRN
  Administered 2018-03-02: 4 mg via ORAL
  Filled 2018-03-02: qty 1

## 2018-03-02 MED ORDER — PROPOFOL 500 MG/50ML IV EMUL
INTRAVENOUS | Status: DC | PRN
  Administered 2018-03-02: 25 ug/kg/min via INTRAVENOUS

## 2018-03-02 MED ORDER — KETOROLAC TROMETHAMINE 30 MG/ML IJ SOLN
15.0000 mg | Freq: Four times a day (QID) | INTRAMUSCULAR | Status: DC
  Filled 2018-03-02: qty 1

## 2018-03-02 MED ORDER — BUDESONIDE-FORMOTEROL FUMARATE 160-4.5 MCG/ACT IN AERO
2.0000 | INHALATION_SPRAY | Freq: Two times a day (BID) | RESPIRATORY_TRACT | Status: DC
  Administered 2018-03-02 – 2018-03-03 (×2): 2 via RESPIRATORY_TRACT
  Filled 2018-03-02: qty 6

## 2018-03-02 MED ORDER — MIDAZOLAM HCL 2 MG/2ML IJ SOLN
INTRAMUSCULAR | Status: DC | PRN
  Administered 2018-03-02: 1 mg via INTRAVENOUS

## 2018-03-02 MED ORDER — PROPOFOL 10 MG/ML IV BOLUS
INTRAVENOUS | Status: AC
  Filled 2018-03-02: qty 20

## 2018-03-02 MED ORDER — LACTATED RINGERS IV SOLN
INTRAVENOUS | Status: DC
  Administered 2018-03-02: 07:00:00 via INTRAVENOUS

## 2018-03-02 MED ORDER — ONDANSETRON HCL 4 MG/2ML IJ SOLN: 4.0000 mg | Freq: Once | INTRAMUSCULAR | Status: DC | PRN

## 2018-03-02 MED ORDER — MEPERIDINE HCL 25 MG/ML IJ SOLN: 6.2500 mg | INTRAMUSCULAR | Status: DC | PRN

## 2018-03-02 MED ORDER — EPHEDRINE 5 MG/ML INJ
INTRAVENOUS | Status: AC
  Filled 2018-03-02: qty 10

## 2018-03-02 MED ORDER — ALUM & MAG HYDROXIDE-SIMETH 200-200-20 MG/5ML PO SUSP: 30.0000 mL | ORAL | Status: DC | PRN

## 2018-03-02 MED ORDER — ONDANSETRON HCL 4 MG/2ML IJ SOLN
4.0000 mg | Freq: Four times a day (QID) | INTRAMUSCULAR | Status: DC | PRN
  Administered 2018-03-02: 4 mg via INTRAVENOUS
  Filled 2018-03-02: qty 2

## 2018-03-02 MED ORDER — MENTHOL 3 MG MT LOZG: 1.0000 | LOZENGE | OROMUCOSAL | Status: DC | PRN

## 2018-03-02 MED ORDER — ALBUTEROL SULFATE (2.5 MG/3ML) 0.083% IN NEBU: 2.5000 mg | INHALATION_SOLUTION | RESPIRATORY_TRACT | Status: DC | PRN

## 2018-03-02 MED ORDER — IBUPROFEN 600 MG PO TABS: 600.0000 mg | ORAL_TABLET | Freq: Four times a day (QID) | ORAL | Status: DC | PRN

## 2018-03-02 MED ORDER — CEFAZOLIN SODIUM-DEXTROSE 2-4 GM/100ML-% IV SOLN
2.0000 g | INTRAVENOUS | Status: AC
  Administered 2018-03-02: 2 g via INTRAVENOUS

## 2018-03-02 MED ORDER — ACETAMINOPHEN 500 MG PO TABS
1000.0000 mg | ORAL_TABLET | Freq: Once | ORAL | Status: AC
  Administered 2018-03-02: 1000 mg via ORAL

## 2018-03-02 MED ORDER — TIOTROPIUM BROMIDE MONOHYDRATE 2.5 MCG/ACT IN AERS
2.0000 | INHALATION_SPRAY | Freq: Every day | RESPIRATORY_TRACT | Status: DC
  Administered 2018-03-03: 2 via RESPIRATORY_TRACT
  Filled 2018-03-02: qty 1

## 2018-03-02 MED ORDER — MIDAZOLAM HCL 2 MG/2ML IJ SOLN
INTRAMUSCULAR | Status: AC
  Filled 2018-03-02: qty 2

## 2018-03-02 MED ORDER — ESTRADIOL 0.1 MG/GM VA CREA
TOPICAL_CREAM | VAGINAL | Status: AC
  Filled 2018-03-02: qty 42.5

## 2018-03-02 MED ORDER — ACETAMINOPHEN 500 MG PO TABS
ORAL_TABLET | ORAL | Status: AC
  Filled 2018-03-02: qty 2

## 2018-03-02 MED ORDER — DEXTROSE-NACL 5-0.45 % IV SOLN
INTRAVENOUS | Status: DC
  Administered 2018-03-02 (×2): via INTRAVENOUS
  Administered 2018-03-03: 125 mL/h via INTRAVENOUS

## 2018-03-02 MED ORDER — CEFAZOLIN SODIUM-DEXTROSE 2-4 GM/100ML-% IV SOLN
INTRAVENOUS | Status: AC
  Filled 2018-03-02: qty 100

## 2018-03-02 MED ORDER — ACETAMINOPHEN 10 MG/ML IV SOLN
1000.0000 mg | Freq: Once | INTRAVENOUS | Status: DC | PRN
  Administered 2018-03-02: 1000 mg via INTRAVENOUS

## 2018-03-02 MED ORDER — KETOROLAC TROMETHAMINE 30 MG/ML IJ SOLN
15.0000 mg | Freq: Four times a day (QID) | INTRAMUSCULAR | Status: DC
  Administered 2018-03-02 – 2018-03-03 (×4): 15 mg via INTRAVENOUS
  Filled 2018-03-02 (×3): qty 1

## 2018-03-02 MED ORDER — DEXAMETHASONE SODIUM PHOSPHATE 4 MG/ML IJ SOLN
INTRAMUSCULAR | Status: AC
  Filled 2018-03-02: qty 1

## 2018-03-02 MED ORDER — KETOROLAC TROMETHAMINE 30 MG/ML IJ SOLN: 15.0000 mg | Freq: Once | INTRAMUSCULAR | Status: DC

## 2018-03-02 MED ORDER — ALBUTEROL SULFATE HFA 108 (90 BASE) MCG/ACT IN AERS: 2.0000 | INHALATION_SPRAY | RESPIRATORY_TRACT | Status: DC | PRN

## 2018-03-02 MED ORDER — ONDANSETRON HCL 4 MG/2ML IJ SOLN
INTRAMUSCULAR | Status: AC
  Filled 2018-03-02: qty 2

## 2018-03-02 MED ORDER — ACETAMINOPHEN 10 MG/ML IV SOLN
INTRAVENOUS | Status: AC
  Filled 2018-03-02: qty 100

## 2018-03-02 MED ORDER — FENTANYL CITRATE (PF) 100 MCG/2ML IJ SOLN
INTRAMUSCULAR | Status: AC
  Filled 2018-03-02: qty 2

## 2018-03-02 MED ORDER — SIMETHICONE 80 MG PO CHEW: 80.0000 mg | CHEWABLE_TABLET | Freq: Four times a day (QID) | ORAL | Status: DC | PRN

## 2018-03-02 MED ORDER — BUPIVACAINE IN DEXTROSE 0.75-8.25 % IT SOLN
INTRATHECAL | Status: DC | PRN
  Administered 2018-03-02: 13.5 mg via INTRATHECAL

## 2018-03-02 MED ORDER — INFLUENZA VAC SPLIT HIGH-DOSE 0.5 ML IM SUSY
0.5000 mL | PREFILLED_SYRINGE | INTRAMUSCULAR | Status: DC
  Filled 2018-03-02: qty 0.5

## 2018-03-02 MED ORDER — EPHEDRINE SULFATE 50 MG/ML IJ SOLN
INTRAMUSCULAR | Status: DC | PRN
  Administered 2018-03-02 (×2): 5 mg via INTRAVENOUS

## 2018-03-02 MED ORDER — ONDANSETRON HCL 4 MG/2ML IJ SOLN
INTRAMUSCULAR | Status: DC | PRN
  Administered 2018-03-02: 4 mg via INTRAVENOUS

## 2018-03-02 SURGICAL SUPPLY — 20 items
BLADE SURG 15 STRL LF C SS BP (BLADE) IMPLANT
BLADE SURG 15 STRL SS (BLADE) ×3
CONT PATH 16OZ SNAP LID 3702 (MISCELLANEOUS) IMPLANT
GAUZE PACKING 2X5 YD STRL (GAUZE/BANDAGES/DRESSINGS) ×3 IMPLANT
GLOVE BIO SURGEON STRL SZ8 (GLOVE) ×3 IMPLANT
GLOVE BIOGEL PI IND STRL 6.5 (GLOVE) ×1 IMPLANT
GLOVE BIOGEL PI IND STRL 7.0 (GLOVE) ×1 IMPLANT
GLOVE BIOGEL PI INDICATOR 6.5 (GLOVE) ×2
GLOVE BIOGEL PI INDICATOR 7.0 (GLOVE) ×2
GLOVE ORTHO TXT STRL SZ7.5 (GLOVE) ×3 IMPLANT
GOWN STRL REUS W/TWL LRG LVL3 (GOWN DISPOSABLE) ×6 IMPLANT
NEEDLE HYPO 22GX1.5 SAFETY (NEEDLE) ×2 IMPLANT
NS IRRIG 1000ML POUR BTL (IV SOLUTION) ×3 IMPLANT
PACK VAGINAL WOMENS (CUSTOM PROCEDURE TRAY) ×3 IMPLANT
SUT PDS AB 2-0 CT1 27 (SUTURE) ×4 IMPLANT
SUT VIC AB 2-0 CT2 27 (SUTURE) ×6 IMPLANT
SUT VIC AB 3-0 SH 27 (SUTURE) ×3
SUT VIC AB 3-0 SH 27X BRD (SUTURE) IMPLANT
TOWEL OR 17X24 6PK STRL BLUE (TOWEL DISPOSABLE) ×6 IMPLANT
TRAY FOLEY W/BAG SLVR 14FR (SET/KITS/TRAYS/PACK) ×3 IMPLANT

## 2018-03-02 NOTE — Progress Notes (Signed)
Day of Surgery Procedure(s) (LRB): ANTERIOR REPAIR (CYSTOCELE) (N/A)  Subjective: Patient reports one episode of emesis, but probably more related to taking an oral pain medication on an empty stomach.  Pain managed and nausea resolved now.  Objective: I have reviewed patient's vital signs and intake and output.  General: alert and cooperative GI: soft NT Vaginal Bleeding: minimal  Assessment: s/p Procedure(s) with comments: ANTERIOR REPAIR (CYSTOCELE) (N/A) - OUTPATIENT IN BED: stable  Plan: Order for d/c foley in AM and advised RN can remove packing then as well Patient will likely try to manage pain with motrin and tylenol.   LOS: 0 days    Logan Bores 03/02/2018, 6:41 PM

## 2018-03-02 NOTE — Anesthesia Procedure Notes (Signed)
Spinal  Patient location during procedure: OR Start time: 03/02/2018 7:29 AM End time: 03/02/2018 7:34 AM Staffing Anesthesiologist: Barnet Glasgow, MD Performed: anesthesiologist  Preanesthetic Checklist Completed: patient identified, surgical consent, pre-op evaluation, timeout performed, IV checked, risks and benefits discussed and monitors and equipment checked Spinal Block Patient position: sitting Prep: site prepped and draped and DuraPrep Patient monitoring: heart rate, cardiac monitor, continuous pulse ox and blood pressure Approach: midline Location: L3-4 Injection technique: single-shot Needle Needle type: Pencan  Needle gauge: 24 G Needle length: 10 cm Needle insertion depth: 5 cm Assessment Sensory level: T4 Additional Notes 1 attempt patient tolerated procedure well

## 2018-03-02 NOTE — Op Note (Signed)
Preoperative diagnosis: Symptomatic cystocele Postoperative diagnosis: same Procedure: Anterior repair Surgeon: Cheri Fowler M.D. Asst.: Crawford Givens, D.O. Anesthesia: Spinal Findings: She had a Gr II-III cystocele, minimal rectocele estimated blood loss: 235 cc Complications: None Specimens: None  Procedure in detail:  The patient was taken to the operating room and placed in the sitting position. The anesthesiologist instilled spinal anesthesia. She was then placed in the dorsosupine position. Once the level of her spinal was found to be ascending appropriately her legs were placed in mobile stirrups. Perineum and vagina were then prepped and draped in usual sterile fashion and bladder drained with a Robinson catheter. A weighted speculum was placed in the vagina. The vaginal border with the cervix was grasped with 2 Allis clamps and a horizontal piece of vaginal tissue was removed. The cystocele was then mobilized by dissecting anteriorly in the midline from the vaginal apex to within about 2 cm of the urethral meatus in the midline. Cystocele was mobilized laterally sharply and bluntly, bleeding controlled with Bovie and 2 figure 8 sutures of 3-0 Vicryl. Cystocele was then reduced with several interrupted sutures of 2-0 PDS with adequate reduction. Excess vaginal tissue was removed and the incision was closed with running locking 2-0 Vicryl with adequate closure and adequate hemostasis.  I then inspected for a rectocele and performed a rectal exam with an over-glove.  She had minimal rectocele.  She was awake and able to cough, no significant rectocele appreciated, so posterior repair was not done. The vagina was then packed with 2 inch gauze coated with Estrace cream. A Foley catheter was also placed. The patient tolerated the procedure well. She was taken to recovery in stable condition. Counts were correct, she had PAS hose on throughout the procedure. She received Ancef 2 g for surgical  prophylaxis.

## 2018-03-02 NOTE — Interval H&P Note (Signed)
History and Physical Interval Note:  03/02/2018 7:13 AM  [name]  has presented today for surgery, with the diagnosis of symptomatic cystocele  The various methods of treatment have been discussed with the patient and family. After consideration of risks, benefits and other options for treatment, the patient has consented to  Procedure(s) with comments: ANTERIOR REPAIR (CYSTOCELE) (N/A) - OUTPATIENT IN BED, possible posterior repair also, as a surgical intervention .  The patient's history has been reviewed, patient examined, no change in status, stable for surgery.  I have reviewed the patient's chart and labs.  Questions were answered to the patient's satisfaction.     Blane Ohara Anglia Blakley

## 2018-03-02 NOTE — Transfer of Care (Signed)
Immediate Anesthesia Transfer of Care Note  Patient: [name]  Procedure(s) Performed: ANTERIOR REPAIR (CYSTOCELE) (N/A )  Patient Location: PACU  Anesthesia Type:Spinal  Level of Consciousness: awake, alert  and oriented  Airway & Oxygen Therapy: Patient Spontanous Breathing and Patient connected to nasal cannula oxygen  Post-op Assessment: Report given to RN and Post -op Vital signs reviewed and stable  Post vital signs: Reviewed and stable  Last Vitals:  Vitals Value Taken Time  BP    Temp    Pulse    Resp    SpO2      Last Pain:  Vitals:   03/02/18 0626  TempSrc: Oral  PainSc: 0-No pain      Patients Stated Pain Goal: 4 (63/01/60 1093)  Complications: No apparent anesthesia complications

## 2018-03-03 ENCOUNTER — Encounter (HOSPITAL_COMMUNITY): Payer: Self-pay | Admitting: Obstetrics and Gynecology

## 2018-03-03 DIAGNOSIS — N8110 Cystocele, unspecified: Secondary | ICD-10-CM | POA: Diagnosis not present

## 2018-03-03 LAB — CBC
HCT: 36.8 % (ref 36.0–46.0)
Hemoglobin: 11.8 g/dL — ABNORMAL LOW (ref 12.0–15.0)
MCH: 30.2 pg (ref 26.0–34.0)
MCHC: 32.1 g/dL (ref 30.0–36.0)
MCV: 94.1 fL (ref 78.0–100.0)
Platelets: 210 10*3/uL (ref 150–400)
RBC: 3.91 MIL/uL (ref 3.87–5.11)
RDW: 14.7 % (ref 11.5–15.5)
WBC: 8 10*3/uL (ref 4.0–10.5)

## 2018-03-03 MED ORDER — OXYCODONE HCL 5 MG PO TABS: 5.0000 mg | ORAL_TABLET | ORAL | 0 refills | Status: DC | PRN

## 2018-03-03 NOTE — Discharge Instructions (Signed)
Routine instructions for anterior repair

## 2018-03-03 NOTE — Progress Notes (Signed)
Foley cath discontinued as ordered.  Vaginal packing removed as ordered with small amount dart red drainage noted on packing. Patient tolerated well.  IV remains infusing this am since patient had nausea and vomiting during night none since mn.

## 2018-03-03 NOTE — Anesthesia Postprocedure Evaluation (Signed)
Anesthesia Post Note  Patient: [name]  Procedure(s) Performed: ANTERIOR REPAIR (CYSTOCELE) (N/A )     Patient location during evaluation: PACU Anesthesia Type: Spinal Level of consciousness: oriented and awake and alert Pain management: pain level controlled Vital Signs Assessment: post-procedure vital signs reviewed and stable Respiratory status: spontaneous breathing, respiratory function stable and patient connected to nasal cannula oxygen Cardiovascular status: blood pressure returned to baseline and stable Postop Assessment: no headache, no backache and no apparent nausea or vomiting Anesthetic complications: no    Last Vitals:  Vitals:   03/03/18 0328 03/03/18 0826  BP: 119/64 138/69  Pulse: 70 72  Resp: 16 16  Temp: 36.8 C 36.9 C  SpO2: 96% 92%    Last Pain:  Vitals:   03/03/18 1045  TempSrc:   PainSc: 2                  Barnet Glasgow

## 2018-03-03 NOTE — Progress Notes (Signed)
1 Day Post-Op Procedure(s) (LRB): ANTERIOR REPAIR (CYSTOCELE) (N/A)  Subjective: Patient reports incisional pain and tolerating PO.    Objective: I have reviewed patient's vital signs, intake and output and labs.  General: alert Vaginal Bleeding: minimal  Assessment: s/p Procedure(s) with comments: ANTERIOR REPAIR (CYSTOCELE) (N/A) - OUTPATIENT IN BED: stable and progressing well  Plan: Encourage ambulation Discontinue IV fluids Discharge home  LOS: 0 days    [name] 03/03/2018, 7:22 AM

## 2018-03-03 NOTE — Discharge Summary (Signed)
Physician Discharge Summary  Patient ID: [name] MRN: [id number] DOB/AGE: 72-Jan-1947 72 y.o.  Admit date: 03/02/2018 Discharge date: 03/03/2018  Admission Diagnoses:  Symptomatic cystocele  Discharge Diagnoses: Same Active Problems:   Cystocele with prolapse   Discharged Condition: good  Hospital Course: Pt admitted for anterior repair with spinal anesthesia.  No complications, no other pelvic defects corrected.  She had some initial n/v post-op, but stable for discharge POD #1   Discharge Exam: Blood pressure 138/69, pulse 72, temperature 98.4 F (36.9 C), temperature source Oral, resp. rate 16, height _0  (1.575 m), weight 46.7 kg, SpO2 92 %. General appearance: alert  Disposition: Discharge disposition: 01-Home or Self Care       Discharge Instructions    Call MD for:  difficulty breathing, headache or visual disturbances   Complete by:  As directed    Call MD for:  extreme fatigue   Complete by:  As directed    Call MD for:  persistant dizziness or light-headedness   Complete by:  As directed    Call MD for:  persistant nausea and vomiting   Complete by:  As directed    Call MD for:  severe uncontrolled pain   Complete by:  As directed    Call MD for:  temperature >100.4   Complete by:  As directed    Diet - low sodium heart healthy   Complete by:  As directed    Increase activity slowly   Complete by:  As directed    Lifting restrictions   Complete by:  As directed    10 lbs   Sexual Activity Restrictions   Complete by:  As directed    Pelvic rest     Allergies as of 03/03/2018      Reactions   Tramadol Other (See Comments)   GI UPSET      Medication List    TAKE these medications   albuterol (2.5 MG/3ML) 0.083% nebulizer solution Commonly known as:  PROVENTIL Take 3 mLs (2.5 mg total) by nebulization every 6 (six) hours as needed for wheezing or shortness of breath.   albuterol 108 (90 Base) MCG/ACT inhaler Commonly known as:   PROVENTIL HFA;VENTOLIN HFA 2 puffs every 4 hours as needed only  if your can't catch your breath   budesonide-formoterol 160-4.5 MCG/ACT inhaler Commonly known as:  SYMBICORT Inhale 2 puffs into the lungs 2 (two) times daily.   buPROPion 150 MG 12 hr tablet Commonly known as:  WELLBUTRIN SR Take 1 tablet by mouth daily.   CAL MAG ZINC +D3 PO Take 1 tablet by mouth at bedtime.   cholecalciferol 1000 units tablet Commonly known as:  VITAMIN D Take 1,000 Units by mouth daily.   diltiazem 360 MG 24 hr capsule Commonly known as:  CARDIZEM CD Take 360 mg by mouth daily.   FLUTTER Devi Use as directed   furosemide 20 MG tablet Commonly known as:  LASIX Take 20 mg by mouth daily as needed for fluid.   Garlic 366 MG Tabs Take 1 tablet by mouth daily.   HYDROcodone-acetaminophen 5-325 MG tablet Commonly known as:  NORCO/VICODIN Take 1 tablet by mouth 2 (two) times daily as needed.   losartan 100 MG tablet Commonly known as:  COZAAR Take 100 mg by mouth daily.   ondansetron 4 MG tablet Commonly known as:  ZOFRAN Take 1 tablet (4 mg total) by mouth every 8 (eight) hours as needed for nausea or vomiting.   oxyCODONE 5  MG immediate release tablet Commonly known as:  Oxy IR/ROXICODONE Take 1 tablet (5 mg total) by mouth every 4 (four) hours as needed for severe pain.   OXYGEN Inhale 2 L into the lungs at bedtime.   potassium chloride 10 MEQ tablet Commonly known as:  K-DUR Take 1 tablet by mouth daily as needed (swelling). When takes lasix   Tiotropium Bromide Monohydrate 2.5 MCG/ACT Aers Inhale 2 puffs into the lungs daily.   vitamin C with rose hips 500 MG tablet Take 500 mg by mouth daily.      Follow-up Information    Vika Buske, MD. Schedule an appointment as soon as possible for a visit in 6 week(s).   Specialty:  Obstetrics and Gynecology Contact information: [address] [place] [address] [place] [address] [phone]            Signed: Blane Ohara Lillar Bianca 03/03/2018, 8:38 AM

## 2018-05-03 ENCOUNTER — Ambulatory Visit (INDEPENDENT_AMBULATORY_CARE_PROVIDER_SITE_OTHER): Payer: Medicare Other | Admitting: Primary Care

## 2018-05-03 ENCOUNTER — Encounter: Payer: Self-pay | Admitting: Primary Care

## 2018-05-03 ENCOUNTER — Telehealth: Payer: Self-pay | Admitting: Primary Care

## 2018-05-03 ENCOUNTER — Telehealth: Payer: Self-pay | Admitting: Internal Medicine

## 2018-05-03 VITALS — BP 110/60 | HR 82 | Temp 98.4°F | Ht 62.0 in | Wt 104.0 lb

## 2018-05-03 DIAGNOSIS — J441 Chronic obstructive pulmonary disease with (acute) exacerbation: Secondary | ICD-10-CM | POA: Diagnosis not present

## 2018-05-03 DIAGNOSIS — J449 Chronic obstructive pulmonary disease, unspecified: Secondary | ICD-10-CM

## 2018-05-03 MED ORDER — PREDNISONE 10 MG PO TABS: ORAL_TABLET | ORAL | 0 refills | Status: DC

## 2018-05-03 MED ORDER — AZITHROMYCIN 250 MG PO TABS: ORAL_TABLET | ORAL | 0 refills | Status: DC

## 2018-05-03 MED ORDER — TIOTROPIUM BROMIDE MONOHYDRATE 2.5 MCG/ACT IN AERS: 2.0000 | INHALATION_SPRAY | Freq: Every day | RESPIRATORY_TRACT | 2 refills | Status: DC

## 2018-05-03 MED ORDER — LEVALBUTEROL HCL 0.63 MG/3ML IN NEBU
0.6300 mg | INHALATION_SOLUTION | RESPIRATORY_TRACT | Status: AC
  Administered 2018-05-03: 0.63 mg via RESPIRATORY_TRACT

## 2018-05-03 MED ORDER — BUDESONIDE-FORMOTEROL FUMARATE 160-4.5 MCG/ACT IN AERO: 2.0000 | INHALATION_SPRAY | Freq: Two times a day (BID) | RESPIRATORY_TRACT | 6 refills | Status: DC

## 2018-05-03 NOTE — Assessment & Plan Note (Addendum)
-  Continue Symbicort 160 2 puffs BID and Spiriva 2.5 daily  - Has regular scheduled fu with Dr. Melvyn Novas on 05/17/18

## 2018-05-03 NOTE — Telephone Encounter (Signed)
Spoke with patient. She has been scheduled to see Beth at 1130am today. Patient is aware of appt. Nothing further needed at time of call.

## 2018-05-03 NOTE — Assessment & Plan Note (Addendum)
-  Acute COPD exacerbation d/t URI symptoms x 3 days - Given xopenex neb today in office d/t exp wheezes throughout, some improvement after treatment - Needs RX for zpack and prednisone taper as prescribed - Encourage Albuterol neb treatments every 6 hours as needed for sob/wheezing, flutter valve prn congestion and oral hydration  - Ok to return to work if remains afebrile  - Return if symptoms do not improve or worsen, consider CXR

## 2018-05-03 NOTE — Progress Notes (Signed)
Chart and office note reviewed in detail  > agree with a/p as outlined

## 2018-05-03 NOTE — Telephone Encounter (Signed)
Ov - ok add on to me today if NP's full

## 2018-05-03 NOTE — Telephone Encounter (Signed)
Spoke with patient. She stated that she has been coughing since last Friday (04/30/18). So far her cough has productive with clear mucus. She also has a runny nose, clear mucus. She also reports an increase in SOB and a headache. She ran a fever all weekend.   She is requesting to have something toi be called in for her to [facility] on [address].   MW, please advise. Thanks!   Current Outpatient Medications on File Prior to Visit  Medication Sig Dispense Refill  . albuterol (PROAIR HFA) 108 (90 Base) MCG/ACT inhaler 2 puffs every 4 hours as needed only  if your can't catch your breath 1 Inhaler 11  . albuterol (PROVENTIL) (2.5 MG/3ML) 0.083% nebulizer solution Take 3 mLs (2.5 mg total) by nebulization every 6 (six) hours as needed for wheezing or shortness of breath. 75 mL 12  . Ascorbic Acid (VITAMIN C WITH ROSE HIPS) 500 MG tablet Take 500 mg by mouth daily.    . budesonide-formoterol (SYMBICORT) 160-4.5 MCG/ACT inhaler Inhale 2 puffs into the lungs 2 (two) times daily. 1 Inhaler 2  . buPROPion (WELLBUTRIN SR) 150 MG 12 hr tablet Take 1 tablet by mouth daily.     . cholecalciferol (VITAMIN D) 1000 UNITS tablet Take 1,000 Units by mouth daily.    Marland Kitchen diltiazem (CARDIZEM CD) 360 MG 24 hr capsule Take 360 mg by mouth daily.   3  . furosemide (LASIX) 20 MG tablet Take 20 mg by mouth daily as needed for fluid.    . Garlic 443 MG TABS Take 1 tablet by mouth daily.    Marland Kitchen HYDROcodone-acetaminophen (NORCO/VICODIN) 5-325 MG tablet Take 1 tablet by mouth 2 (two) times daily as needed.   0  . losartan (COZAAR) 100 MG tablet Take 100 mg by mouth daily.  0  . Multiple Minerals-Vitamins (CAL MAG ZINC +D3 PO) Take 1 tablet by mouth at bedtime.    . ondansetron (ZOFRAN) 4 MG tablet Take 1 tablet (4 mg total) by mouth every 8 (eight) hours as needed for nausea or vomiting. 12 tablet 2  . oxyCODONE (OXY IR/ROXICODONE) 5 MG immediate release tablet Take 1 tablet (5 mg total) by mouth every 4 (four) hours as  needed for severe pain. 15 tablet 0  . OXYGEN Inhale 2 L into the lungs at bedtime.    . potassium chloride (K-DUR) 10 MEQ tablet Take 1 tablet by mouth daily as needed (swelling). When takes lasix   3  . Respiratory Therapy Supplies (FLUTTER) DEVI Use as directed 1 each 0  . Tiotropium Bromide Monohydrate (SPIRIVA RESPIMAT) 2.5 MCG/ACT AERS Inhale 2 puffs into the lungs daily. 1 Inhaler 0   No current facility-administered medications on file prior to visit.    Allergies  Allergen Reactions  . Tramadol Other (See Comments)    GI UPSET

## 2018-05-03 NOTE — Progress Notes (Signed)
_0  ID: [name], female    DOB: February 23, 1946, 72 y.o.   MRN: [id number]  Chief Complaint  Patient presents with  . Acute Visit    cough with clear mucus, headache, nose running, chest tightness, SOB x4 days    Referring provider: Christain Sacramento, MD  HPI: 72 year old female, former smoker quit 2018 (57 pack year hx). PMH pulmonary hypertension, COPD GOLD III, recurrent pneumonia. Patient of Dr. Melvyn Novas, last seen 02/03/18. Maintained on Symbicort 160 and Spiriva.   05/03/2018 Patient presents today for acute visit with complaints of sob at rest and cough with clear mucus x3 days.  Reports that she is a low-grade temp 99 this morning.  She is been using Symbicort and Spiriva as prescribed.  Used rescue inhaler 3 times yesterday some improvement.  She has not tried nebulizer treatments. She is afebrile today in office. VSS. Feels well enough to be treated in outpatient setting. She is up-to-date with Prevnar and pneumococcal.  She received influenza vaccine in September 2019.   Allergies  Allergen Reactions  . Tramadol Other (See Comments)    GI UPSET    Immunization History  Administered Date(s) Administered  . Influenza Split 03/17/2011, 03/30/2013, 04/03/2014, 02/14/2015  . Influenza Whole 03/17/2012, 03/09/2016  . Influenza, High Dose Seasonal PF 03/10/2017, 03/15/2018  . Pneumococcal Conjugate-13 01/16/2014  . Pneumococcal Polysaccharide-23 06/16/2008  . Tdap 06/16/2005, 07/15/2016    Past Medical History:  Diagnosis Date  . Abrasion    under left knee x 2 places changing dressing q day of bid, applying ssd cream area healing due to fell 2 weeks ago  . Blood dyscrasia    BLEEDS EASY  . Bruises easily    bleeds easily  . Closed patellar sleeve fracture of left knee 01/04/2018  . Closed patellar sleeve fracture of right knee 01/04/2018   healing   . COPD (chronic obstructive pulmonary disease) ([place])   . Coronary artery disease    per dr Philis Kendall note  01-01-18 note  . Hernia, inguinal    LEFT  . Hypertension    states under control with meds., has been on med. > 10 yr.  . On home oxygen therapy    at night 2L/min  . Pneumonia    "SEVERAL TIMES IN PAST, LAST TIME 2018"  . Pulmonary hypertension ([place])    per dr Terrence Dupont 7-19 19 lov note  . Shortness of breath dyspnea    with exertion  . SVD (spontaneous vaginal delivery)    x 1  . Thin skin   . Urinary tract infection 2019  . Wears partial dentures    lower partial and upper plate    Tobacco History: Social History   Tobacco Use  Smoking Status Former Smoker  . Packs/day: 1.00  . Years: 57.00  . Pack years: 57.00  . Types: Cigarettes  . Last attempt to quit: 03/20/2017  . Years since quitting: 1.1  Smokeless Tobacco Never Used  Tobacco Comment   <1ppd 02/05/2016   Counseling given: Not Answered Comment: <1ppd 02/05/2016   Outpatient Medications Prior to Visit  Medication Sig Dispense Refill  . albuterol (PROAIR HFA) 108 (90 Base) MCG/ACT inhaler 2 puffs every 4 hours as needed only  if your can't catch your breath 1 Inhaler 11  . albuterol (PROVENTIL) (2.5 MG/3ML) 0.083% nebulizer solution Take 3 mLs (2.5 mg total) by nebulization every 6 (six) hours as needed for wheezing or shortness of breath. 75 mL 12  .  Ascorbic Acid (VITAMIN C WITH ROSE HIPS) 500 MG tablet Take 500 mg by mouth daily.    . budesonide-formoterol (SYMBICORT) 160-4.5 MCG/ACT inhaler Inhale 2 puffs into the lungs 2 (two) times daily. 1 Inhaler 2  . buPROPion (WELLBUTRIN SR) 150 MG 12 hr tablet Take 1 tablet by mouth daily.     . cholecalciferol (VITAMIN D) 1000 UNITS tablet Take 1,000 Units by mouth daily.    Marland Kitchen diltiazem (CARDIZEM CD) 360 MG 24 hr capsule Take 360 mg by mouth daily.   3  . furosemide (LASIX) 20 MG tablet Take 20 mg by mouth daily as needed for fluid.    . Garlic 161 MG TABS Take 1 tablet by mouth daily.    Marland Kitchen HYDROcodone-acetaminophen (NORCO/VICODIN) 5-325 MG tablet Take 1 tablet by  mouth 2 (two) times daily as needed.   0  . losartan (COZAAR) 100 MG tablet Take 100 mg by mouth daily.  0  . Multiple Minerals-Vitamins (CAL MAG ZINC +D3 PO) Take 1 tablet by mouth at bedtime.    . OXYGEN Inhale 2 L into the lungs at bedtime.    . Tiotropium Bromide Monohydrate (SPIRIVA RESPIMAT) 2.5 MCG/ACT AERS Inhale 2 puffs into the lungs daily. 1 Inhaler 0  . ondansetron (ZOFRAN) 4 MG tablet Take 1 tablet (4 mg total) by mouth every 8 (eight) hours as needed for nausea or vomiting. (Patient not taking: Reported on 05/03/2018) 12 tablet 2  . oxyCODONE (OXY IR/ROXICODONE) 5 MG immediate release tablet Take 1 tablet (5 mg total) by mouth every 4 (four) hours as needed for severe pain. (Patient not taking: Reported on 05/03/2018) 15 tablet 0  . potassium chloride (K-DUR) 10 MEQ tablet Take 1 tablet by mouth daily as needed (swelling). When takes lasix   3  . Respiratory Therapy Supplies (FLUTTER) DEVI Use as directed (Patient not taking: Reported on 05/03/2018) 1 each 0   No facility-administered medications prior to visit.     Review of Systems  Review of Systems  Constitutional: Negative.   HENT: Positive for postnasal drip.   Respiratory: Positive for cough and shortness of breath.   Cardiovascular: Negative.   Neurological: Positive for headaches.   Physical Exam  BP 110/60 (BP Location: Left Arm, Cuff Size: Normal)   Pulse 82   Temp 98.4 F (36.9 C)   Ht _0  (1.575 m)   Wt 104 lb (47.2 kg)   SpO2 91%   BMI 19.02 kg/m  Physical Exam  Constitutional: She is oriented to person, place, and time. She appears well-developed and well-nourished. No distress.  HENT:  Head: Normocephalic and atraumatic.  Eyes: Pupils are equal, round, and reactive to light. EOM are normal.  Neck: Normal range of motion. Neck supple.  Cardiovascular: Normal rate and regular rhythm.  Pulmonary/Chest: Effort normal. No respiratory distress. She has wheezes.  Exp wheeze t/o. Dull rhonchi. No resp  distress  Musculoskeletal: Normal range of motion.  Neurological: She is alert and oriented to person, place, and time.  Skin: Skin is warm and dry.  Psychiatric: She has a normal mood and affect. Her behavior is normal. Judgment and thought content normal.     Lab Results:  CBC    Component Value Date/Time   WBC 8.0 03/03/2018 0515   RBC 3.91 03/03/2018 0515   HGB 11.8 (L) 03/03/2018 0515   HCT 36.8 03/03/2018 0515   PLT 210 03/03/2018 0515   MCV 94.1 03/03/2018 0515   MCH 30.2 03/03/2018 0515   MCHC  32.1 03/03/2018 0515   RDW 14.7 03/03/2018 0515   LYMPHSABS 1.9 07/04/2017 1241   MONOABS 0.7 07/04/2017 1241   EOSABS 0.1 07/04/2017 1241   BASOSABS 0.0 07/04/2017 1241    BMET    Component Value Date/Time   NA 137 02/17/2018 1446   K 4.7 02/17/2018 1446   CL 100 02/17/2018 1446   CO2 24 02/17/2018 1446   GLUCOSE 96 02/17/2018 1446   BUN 20 02/17/2018 1446   CREATININE 0.57 02/17/2018 1446   CALCIUM 9.5 02/17/2018 1446   GFRNONAA >60 02/17/2018 1446   GFRAA >60 02/17/2018 1446    BNP No results found for: BNP  ProBNP    Component Value Date/Time   PROBNP 102.0 08/19/2012 1455    Imaging: No results found.   Assessment & Plan:   COPD exacerbation ([place]) - Acute COPD exacerbation d/t URI symptoms x 3 days - Given xopenex neb today in office d/t exp wheezes throughout, some improvement after treatment - Needs RX for zpack and prednisone taper as prescribed - Encourage Albuterol neb treatments every 6 hours as needed for sob/wheezing, flutter valve prn congestion and oral hydration  - Ok to return to work if remains afebrile  - Return if symptoms do not improve or worsen, consider CXR     COPD GOLD III - Continue Symbicort 160 2 puffs BID and Spiriva 2.5 daily  - Has regular scheduled fu with Dr. Melvyn Novas on 05/17/18    Martyn Ehrich, NP 05/03/2018

## 2018-05-03 NOTE — Telephone Encounter (Signed)
ATC [facility] back but was placed on a long hold. Resent RX for prednisone. Will close this encounter.

## 2018-05-03 NOTE — Patient Instructions (Addendum)
Seen today for COPD exacerbation secondary to URI  Office treatment: Xopenex neb treatment x1 today  Rx: Prednisone taper as prescribed Z-Pak as prescribed  Recommendations: Used Albuterol nebulizer every 6 hours as needed for shortness of breath/wheezing for the next 3 to 5 days until better  You can return to work as long as you are afebrile for 24 hours  Stay well-hydrated, encourage 4-6 glass of fluids (8 oz)  Follow-up: December 2 at 4 PM with Dr. Melvyn Novas Return sooner if symptoms do not improve or worsen in any way, consider chest x-ray at that time

## 2018-05-05 ENCOUNTER — Telehealth: Payer: Self-pay | Admitting: Primary Care

## 2018-05-05 NOTE — Telephone Encounter (Signed)
Called and spoke with pt letting her know that at her OV tomorrow with MW we could take care of the work note for her. Pt expressed understanding. Nothing further needed.

## 2018-05-06 ENCOUNTER — Encounter: Payer: Self-pay | Admitting: Internal Medicine

## 2018-05-06 ENCOUNTER — Encounter: Payer: Self-pay | Admitting: General Surgery

## 2018-05-06 ENCOUNTER — Ambulatory Visit (INDEPENDENT_AMBULATORY_CARE_PROVIDER_SITE_OTHER): Payer: Medicare Other | Admitting: Internal Medicine

## 2018-05-06 DIAGNOSIS — J441 Chronic obstructive pulmonary disease with (acute) exacerbation: Secondary | ICD-10-CM | POA: Diagnosis not present

## 2018-05-06 DIAGNOSIS — J449 Chronic obstructive pulmonary disease, unspecified: Secondary | ICD-10-CM

## 2018-05-06 MED ORDER — HYDROCODONE-ACETAMINOPHEN 5-325 MG PO TABS: 1.0000 | ORAL_TABLET | ORAL | 0 refills | Status: DC | PRN

## 2018-05-06 MED ORDER — PREDNISONE 10 MG PO TABS: ORAL_TABLET | ORAL | 0 refills | Status: DC

## 2018-05-06 MED ORDER — TIOTROPIUM BROMIDE MONOHYDRATE 1.25 MCG/ACT IN AERS: 1.0000 | INHALATION_SPRAY | Freq: Every day | RESPIRATORY_TRACT | 0 refills | Status: DC

## 2018-05-06 NOTE — Patient Instructions (Addendum)
For cough >  mucinex dm  1200 mg every 12hours with the flutter valve and supplement with oxycodone or hydrocodone every 4 hours and as long as you are coughing > Try prilosec otc 20m  Take 30-60 min before first meal of the day and Pepcid ac (famotidine) 20 mg one @  bedtime until cough is completely gone for at least a week without the need for cough suppression   Finish zpak   Prednisone 10 mg Take 4 for two days three for two days two for two days one for two days   Work on inhaler technique:  relax and gently blow all the way out then take a nice smooth deep breath back in, triggering the inhaler at same time you start breathing in.  Hold for up to 5 seconds if you can. Blow out thru nose. Rinse and gargle with water when done   GERD (REFLUX)  is an extremely common cause of respiratory symptoms just like yours , many times with no obvious heartburn at all.    It can be treated with medication, but also with lifestyle changes including elevation of the head of your bed (ideally with 6 inch  bed blocks),  Smoking cessation, avoidance of late meals, excessive alcohol, and avoid fatty foods, chocolate, peppermint, colas, red wine, and acidic juices such as orange juice.  NO MINT OR MENTHOL PRODUCTS SO NO COUGH DROPS   USE SUGARLESS CANDY INSTEAD (Jolley ranchers or Stover's or Life Savers) or even ice chips will also do - the key is to swallow to prevent all throat clearing. NO OIL BASED VITAMINS - use powdered substitutes.   If not improving you will need to go to ER   Keep prior appt

## 2018-05-06 NOTE — Progress Notes (Signed)
Subjective:   Patient ID: [name], female    DOB: 05/07/1946   MRN: [id number]  Brief patient profile:  45 yowf quit smoking 03/2017  followed in pulmonary clinic for GOLD III copd   History of Present Illness  11/05/2015  f/u ov/Wert re: GOLD III / still smoking / maint rx symb/spriiva dpi  Chief Complaint  Patient presents with  . Follow-up    pt states breathing is baseline. c/o cont SOB, prod cough tan to greenish in color, wheezing, cp/tightness  dyspnea is variable and has portable tanks but never uses them to see if makes any difference in her variable doe / using saba up to 4 x daily instead.   rec You are qualified for portable 02 if you wish but no need to wear it at rest or walking around the house - continue it at bedtime thru the concentrator at 2lpm and wear the portable system as needed during the day Ok to finish up the powder spiriva and just refill the respimat    12/24/2016  f/u ov/Wert re:  COPD GOLD III/ still smoking/ symb/spiriva / 02 2lpm hs only  Chief Complaint  Patient presents with  . Follow-up    Pt states she is still having increase sob with exerton, she still has a slight cough with clear mucus with a tint of blood, lots of wheezing,Denies chest tightness,fever   using saba hfa up to twice daily but no needing noct Doe = MMRC3 = can't walk 100 yards even at a slow pace at a flat grade s stopping due to sob   Has slt bloody nasal discharge in am and scant blood streaked sputum also, just in am and just when nose is bleeding too   03/27/2017  f/u ov/Wert re:  Copd III / 02 hs 2lpm / never using amb 02  Doe = MMRC 3 still off 02  Sleeping on 2lpm with typical gurgling cough in am > sev tsp of thick white mucus/ occ streaks of blood rec Work on inhaler technique:  relax and gently blow all the way out then take a nice smooth deep breath back in, triggering the inhaler at same time you start breathing in.  Hold for up to 5 seconds if you can. Blow  out thru nose. Rinse and gargle with water when done Change the ventolin to proair up to 2 pffs every 4 hours as needed     06/29/2017  f/u ov/Wert re:   GOLD III copd/ 02 hs only  Quit smoking 03/2017  Chief Complaint  Patient presents with  . Follow-up    PFT's done today.  Breathing is unchanged. She has been having some swelling in her legs and feet.   doe = MMRC3 = can't walk 100 yards even at a slow pace at a flat grade s stopping due to sob  rec Work on inhaler technique:  No change in medications  02 is 2lpm at bedtime  Talk to Dr Terrence Dupont re your water pill  Please schedule a follow up visit in 3 months       09/28/2017  f/u ov/Wert re: copd gold III/ 02 hs only, still off cigs / confused with names of meds  Chief Complaint  Patient presents with  . Follow-up    Breathing is doing about the same. She has been coughing up some yellow sputum for the past 2 days. She is using her albuterol inhaler 2-3 x per wk on average.  Dyspnea:  MMRC3 = can't walk 100 yards even at a slow pace at a flat grade s stopping due to sob  / no 02 Cough: congested, worse in am and more yellow x sev days prior to OV    Sleep: ok SABA use: rarely rec zpak  Take all medications to your doctors so they all have an accurate list of your medications Please schedule a follow up office visit in 6 weeks, call sooner if needed with pfts on return      11/11/2017  f/u ov/Wert re:  GOLD III/ 02 hs prn  Chief Complaint  Patient presents with  . Follow-up    Breathing is about the same. She is using her albuterol inhaler 2-3 x per wk on average and she rarely uses neb.   Dyspnea:   No change in doe = MMRC3  Cough: not much at all  Sleep: flat on 2lpm  SABA use:  As above Sleeping  Ok on 2lpm flat   rec Discuss the fluid pills with yDr Redmond Pulling but I will recommend he consider adding aldactone if not improving on lasix alone but have to be very careful with potassium monitoring with these changes so I  will defer to him  Please schedule a follow up visit in 6 months but call sooner if needed - referred to rehab    02/03/2018  f/u ov/Wert re:  GOLD III/ 02 hs only  - pre op clearance  Chief Complaint  Patient presents with  . Follow-up    sx clearance for bladder tack on 8/29.  Pt states her breathing is at baseline.    Dyspnea:  MMRC2 = can't walk a nl pace on a flat grade s sob but does fine slow and flat eg foodlion s 02  Cough: none Sleeping: 2lpm 1-2 pillows ok /min am congestion  SABA use: neither or hfa or neb  02: 2lpm hs only   rec Work on inhaler technique:  relax and gently blow all the way out then take a nice smooth deep breath back in, triggering the inhaler at same time you start breathing in.  Hold for up to 5 seconds if you can. Blow symbicort out thru nose. Rinse and gargle with water when done Use your nebulizer the morning of your surgery    05/03/18 acute ov/ NP aecopd rec Seen today for COPD exacerbation secondary to URI Office treatment: Xopenex neb treatment x1 today Rx: Prednisone taper as prescribed Z-Pak as prescribed Recommendations: Used Albuterol nebulizer every 6 hours as needed for shortness of breath/wheezing for the next 3 to 5 days until better You can return to work as long as you are afebrile for 24 hours Stay well-hydrated, encourage 4-6 glass of fluids (8 oz)    05/06/2018 acute extended ov/Wert re: sob/cough / did not bring meds not using flutter  Chief Complaint  Patient presents with  . Follow-up    was here   11/18 for SOB and cough.  Not doing any better.    Usual state of healthy until 11/15 severe cough never productive assoc with scratchy throat / watery rhinits   sleeping on 2lpm flat/ no worse in am but worse as day goes on and does not feel neb helps as much as hfa but extremely  technique poor hfa technique  Sob at rest / reports prefers hfa over neb when can't catch her breath.  No obvious day to day or daytime  variability or assoc excess/ purulent sputum  or mucus plugs or hemoptysis or cp or chest tightness, subjective wheeze or overt sinus or hb symptoms.   Actually sleeping ok as above  without nocturnal  or early am exacerbation  of respiratory  c/o's or need for noct saba. Also denies any obvious fluctuation of symptoms with weather or environmental changes or other aggravating or alleviating factors except as outlined above   No unusual exposure hx or h/o childhood pna/ asthma or knowledge of premature birth.  Current Allergies, Complete Past Medical History, Past Surgical History, Family History, and Social History were reviewed in [employer] record.  ROS  The following are not active complaints unless bolded Hoarseness, sore throat, dysphagia, dental problems, itching, sneezing,  nasal congestion or discharge of excess mucus or purulent secretions, ear ache,   fever, chills, sweats, unintended wt loss or wt gain, classically pleuritic or exertional cp,  orthopnea pnd or arm/hand swelling  or leg swelling, presyncope, palpitations, abdominal pain, anorexia, nausea, vomiting, diarrhea  or change in bowel habits or change in bladder habits, change in stools or change in urine, dysuria, hematuria,  rash, arthralgias, visual complaints, headache, numbness, weakness or ataxia or problems with walking or coordination,  change in mood or  memory.        Current Meds - - NOTE:   Unable to verify as accurately reflecting what pt takes     Medication Sig  . albuterol (PROAIR HFA) 108 (90 Base) MCG/ACT inhaler 2 puffs every 4 hours as needed only  if your can't catch your breath  . albuterol (PROVENTIL) (2.5 MG/3ML) 0.083% nebulizer solution Take 3 mLs (2.5 mg total) by nebulization every 6 (six) hours as needed for wheezing or shortness of breath.  . Ascorbic Acid (VITAMIN C WITH ROSE HIPS) 500 MG tablet Take 500 mg by mouth daily.  Marland Kitchen azithromycin (ZITHROMAX) 250 MG tablet Zpack taper  as directed  . budesonide-formoterol (SYMBICORT) 160-4.5 MCG/ACT inhaler Inhale 2 puffs into the lungs 2 (two) times daily.  Marland Kitchen buPROPion (WELLBUTRIN SR) 150 MG 12 hr tablet Take 1 tablet by mouth daily.   . cholecalciferol (VITAMIN D) 1000 UNITS tablet Take 1,000 Units by mouth daily.  Marland Kitchen diltiazem (CARDIZEM CD) 360 MG 24 hr capsule Take 360 mg by mouth daily.   . furosemide (LASIX) 20 MG tablet Take 20 mg by mouth daily as needed for fluid.  . Garlic 106 MG TABS Take 1 tablet by mouth daily.  Marland Kitchen HYDROcodone-acetaminophen (NORCO/VICODIN) 5-325 MG tablet Take 1 tablet by mouth every 4 (four) hours as needed.  Marland Kitchen losartan (COZAAR) 100 MG tablet Take 100 mg by mouth daily.  . Multiple Minerals-Vitamins (CAL MAG ZINC +D3 PO) Take 1 tablet by mouth at bedtime.  . ondansetron (ZOFRAN) 4 MG tablet Take 1 tablet (4 mg total) by mouth every 8 (eight) hours as needed for nausea or vomiting.  . OXYGEN Inhale 2 L into the lungs at bedtime.  . potassium chloride (K-DUR) 10 MEQ tablet Take 1 tablet by mouth daily as needed (swelling). When takes lasix   . Respiratory Therapy Supplies (FLUTTER) DEVI Use as directed  . Tiotropium Bromide Monohydrate (SPIRIVA RESPIMAT) 2.5 MCG/ACT AERS Inhale 2 puffs into the lungs daily.  . [DISCONTINUED] budesonide-formoterol (SYMBICORT) 160-4.5 MCG/ACT inhaler Inhale 2 puffs into the lungs 2 (two) times daily.  . [DISCONTINUED] HYDROcodone-acetaminophen (NORCO/VICODIN) 5-325 MG tablet Take 1 tablet by mouth 2 (two) times daily as needed.   . [DISCONTINUED] oxyCODONE (OXY IR/ROXICODONE) 5 MG immediate release tablet  Take 1 tablet (5 mg total) by mouth every 4 (four) hours as needed for severe pain.  . [DISCONTINUED] predniSONE (DELTASONE) 10 MG tablet Take 4 tabs po daily x 2 days; then 3 tabs for 2 days; then 2 tabs for 2 days; then 1 tab for 2 days  . [DISCONTINUED] Tiotropium Bromide Monohydrate (SPIRIVA RESPIMAT) 2.5 MCG/ACT AERS Inhale 2 puffs into the lungs daily.            Objective:   Physical Exam  Thin wf with classic pseudowheeze / rattling upper airway coughing fits/better with plm   Vital signs reviewed - Note on arrival 02 sats  97% on RA     09/26/2013  101>102 10/11/2013 > 01/16/2014  99 > 04/06/2014  103 >105 06/30/2014 > 08/11/2014  101 > 11/09/2014  104 > 01/22/2015 106  > 02/06/2015 105 > 03/08/2015 106 > 04/18/2015  106 > 06/15/2015 103 > 09/14/2015   102 > 10/08/2015   103 > 11/05/2015  102 > 02/05/2016  103 > 05/14/2016 102 > 09/24/2016  99 > 12/24/2016  99 > 06/29/2017  104 > 09/28/2017  106 > 11/11/2017 104 > 02/03/2018  107 > 05/06/2018   105      HEENT: nl dentition / oropharynx. Nl external ear canals without cough reflex -  Mild bilateral non-specific turbinate edema     NECK :  without JVD/Nodes/TM/ nl carotid upstrokes bilaterally   LUNGS: no acc muscle use,  Mod barrel  contour chest wall with bilateral  Distant  Wheezes better with plm  and  without cough on insp or exp maneuver and mod  Hyperresonant  to  percussion bilaterally     CV:  RRR  no s3 or murmur or increase in P2, and no edema   ABD:  soft and nontender with pos mid insp Hoover's  in the supine position. No bruits or organomegaly appreciated, bowel sounds nl  MS:   Nl gait/  ext warm without deformities, calf tenderness, cyanosis or clubbing No obvious joint restrictions   SKIN: warm and dry without lesions    NEURO:  alert, approp, nl sensorium with  no motor or cerebellar deficits apparent.                    Assessment & Plan:

## 2018-05-07 ENCOUNTER — Encounter: Payer: Self-pay | Admitting: Internal Medicine

## 2018-05-07 NOTE — Assessment & Plan Note (Signed)
See copd a/p - unfortunately she reports no confidence in the neb as "Plan C" for exacerbations and is over reliant on hfa but uses this very poorly.

## 2018-05-07 NOTE — Assessment & Plan Note (Addendum)
pfts 11/2011   FEV1 0.86 (42%) ratio 35  - alpha one AT screening 03/24/12   MM  Level 166  - 09/14/2015  hfa Arlana Pouch 11/05/2015 (when spiriva dpi supply runs out) - flutter valve added 02/05/2016  And recoached 05/14/2016  - PFT's  06/29/2017  FEV1 0.92 (44 % ) ratio 44  p 11 % improvement from saba p symb/spiriva prior to study with DLCO  33/33 % corrects to 35 % for alv volume   - 11/11/2017 referred to rehab   05/03/2018 - COPD exac d/t URI, RX zpack and prednisone taper   05/06/2018  After extensive coaching inhaler device,  effectiveness =    25% baseline (short ti)    DDX of  difficult airways management almost all start with A and  include Adherence, Ace Inhibitors, Acid Reflux, Active Sinus Disease, Alpha 1 Antitripsin deficiency, Anxiety masquerading as Airways dz,  ABPA,  Allergy(esp in young), Aspiration (esp in elderly), Adverse effects of meds,  Active smoking or vaping, A bunch of PE's (a small clot burden can't cause this syndrome unless there is already severe underlying pulm or vascular dz with poor reserve) plus two Bs  = Bronchiectasis and Beta blocker use..and one C= CHF   Adherence is always the initial "prime suspect" and is a multilayered concern that requires a "trust but verify" approach in every patient - starting with knowing how to use medications, especially inhalers, correctly, keeping up with refills and understanding the fundamental difference between maintenance and prns vs those medications only taken for a very short course and then stopped and not refilled.  - see hfa teaching  - with all meds in hand using a trust but verify approach to confirm accurate Medication  Reconciliation The principal here is that until we are certain that the  patients are doing what we've asked, it makes no sense to ask them to do more.    ? Acid (or non-acid) GERD > always difficult to exclude as up to 75% of pts in some series report no assoc GI/ Heartburn symptoms> rec max (24h)  acid  suppression and diet restrictions/ reviewed and instructions given in writing.  -   Of the three most common causes of  Sub-acute / recurrent or chronic cough, only one (GERD)  can actually contribute to/ trigger  the other two (asthma and post nasal drip syndrome)  and perpetuate the cylce of cough.  While not intuitively obvious, many patients with chronic low grade reflux do not cough until there is a primary insult that disturbs the protective epithelial barrier and exposes sensitive nerve endings.   This is typically viral but can due to PNDS and  either may apply here.   The point is that once this occurs, it is difficult to eliminate the cycle  using anything but a maximally effective acid suppression regimen at least in the short run, accompanied by an appropriate diet to address non acid GERD and control / eliminate the cough itself for at least 3 days with tyl #3    ? Allergy/asthma component > extend pred to 8 day rx see avs for instructions unique to this ov    ? Active sinus dz > doubt and no evidence pna > rx zpak adequate  ? Anxiety/depression > usually at the bottom of this list of usual suspects but should be much higher on this pt's based on H and P and note already on psychotropics and may interfere with adherence and also interpretation of  response or lack thereof to symptom management which can be quite subjective.   ? Active smoking > denies/ reinforced   ? Bronchiectasis/ poor mc function > rec mucinex dm max doses and use flutter valve    If not improving needs to go to ER    I had an extended discussion with the patient reviewing all relevant studies completed to date and  lasting 25 minutes of a 40  minute acute office  visit     re  severe non-specific but potentially very serious refractory respiratory symptoms of uncertain and potentially multiple  etiologies.   Each maintenance medication was reviewed in detail including most importantly the difference between  maintenance and prns and under what circumstances the prns are to be triggered using an action plan format that is not reflected in the computer generated alphabetically organized AVS.    Please see AVS for specific instructions unique to this office visit that I personally wrote and verbalized to the the pt in detail and then reviewed with pt  by my nurse highlighting any changes in therapy/plan of care  recommended at today's visit.       See device teaching which extended face to face time for this visit

## 2018-05-08 ENCOUNTER — Inpatient Hospital Stay (HOSPITAL_COMMUNITY)
Admission: EM | Admit: 2018-05-08 | Discharge: 2018-05-20 | DRG: 190 | Disposition: A | Discharge status: Home / Self Care | Payer: Medicare Other | Attending: Internal Medicine | Admitting: Internal Medicine

## 2018-05-08 ENCOUNTER — Emergency Department (HOSPITAL_COMMUNITY): Payer: Medicare Other

## 2018-05-08 ENCOUNTER — Other Ambulatory Visit: Payer: Self-pay

## 2018-05-08 DIAGNOSIS — R0602 Shortness of breath: Secondary | ICD-10-CM

## 2018-05-08 DIAGNOSIS — I2510 Atherosclerotic heart disease of native coronary artery without angina pectoris: Secondary | ICD-10-CM | POA: Diagnosis present

## 2018-05-08 DIAGNOSIS — J9621 Acute and chronic respiratory failure with hypoxia: Secondary | ICD-10-CM | POA: Diagnosis present

## 2018-05-08 DIAGNOSIS — I10 Essential (primary) hypertension: Secondary | ICD-10-CM | POA: Diagnosis present

## 2018-05-08 DIAGNOSIS — R739 Hyperglycemia, unspecified: Secondary | ICD-10-CM | POA: Diagnosis not present

## 2018-05-08 DIAGNOSIS — Z841 Family history of disorders of kidney and ureter: Secondary | ICD-10-CM

## 2018-05-08 DIAGNOSIS — R339 Retention of urine, unspecified: Secondary | ICD-10-CM | POA: Diagnosis not present

## 2018-05-08 DIAGNOSIS — Z9981 Dependence on supplemental oxygen: Secondary | ICD-10-CM

## 2018-05-08 DIAGNOSIS — J9611 Chronic respiratory failure with hypoxia: Secondary | ICD-10-CM

## 2018-05-08 DIAGNOSIS — Z7951 Long term (current) use of inhaled steroids: Secondary | ICD-10-CM

## 2018-05-08 DIAGNOSIS — Z87891 Personal history of nicotine dependence: Secondary | ICD-10-CM

## 2018-05-08 DIAGNOSIS — J9610 Chronic respiratory failure, unspecified whether with hypoxia or hypercapnia: Secondary | ICD-10-CM | POA: Diagnosis present

## 2018-05-08 DIAGNOSIS — Z8249 Family history of ischemic heart disease and other diseases of the circulatory system: Secondary | ICD-10-CM

## 2018-05-08 DIAGNOSIS — D72829 Elevated white blood cell count, unspecified: Secondary | ICD-10-CM | POA: Diagnosis not present

## 2018-05-08 DIAGNOSIS — R7303 Prediabetes: Secondary | ICD-10-CM | POA: Diagnosis not present

## 2018-05-08 DIAGNOSIS — J441 Chronic obstructive pulmonary disease with (acute) exacerbation: Secondary | ICD-10-CM

## 2018-05-08 DIAGNOSIS — R338 Other retention of urine: Secondary | ICD-10-CM | POA: Diagnosis present

## 2018-05-08 DIAGNOSIS — T380X5A Adverse effect of glucocorticoids and synthetic analogues, initial encounter: Secondary | ICD-10-CM | POA: Diagnosis not present

## 2018-05-08 DIAGNOSIS — I272 Pulmonary hypertension, unspecified: Secondary | ICD-10-CM | POA: Diagnosis present

## 2018-05-08 DIAGNOSIS — Z885 Allergy status to narcotic agent status: Secondary | ICD-10-CM

## 2018-05-08 DIAGNOSIS — E871 Hypo-osmolality and hyponatremia: Secondary | ICD-10-CM | POA: Diagnosis present

## 2018-05-08 LAB — CBC
HCT: 45.2 % (ref 36.0–46.0)
Hemoglobin: 14.6 g/dL (ref 12.0–15.0)
MCH: 29.3 pg (ref 26.0–34.0)
MCHC: 32.3 g/dL (ref 30.0–36.0)
MCV: 90.8 fL (ref 80.0–100.0)
Platelets: 240 10*3/uL (ref 150–400)
RBC: 4.98 MIL/uL (ref 3.87–5.11)
RDW: 13.7 % (ref 11.5–15.5)
WBC: 7.5 10*3/uL (ref 4.0–10.5)
nRBC: 0 % (ref 0.0–0.2)

## 2018-05-08 LAB — BASIC METABOLIC PANEL
Anion gap: 9 (ref 5–15)
BUN: 20 mg/dL (ref 8–23)
CO2: 30 mmol/L (ref 22–32)
Calcium: 9.3 mg/dL (ref 8.9–10.3)
Chloride: 91 mmol/L — ABNORMAL LOW (ref 98–111)
Creatinine, Ser: 0.4 mg/dL — ABNORMAL LOW (ref 0.44–1.00)
GFR calc Af Amer: >60 mL/min (ref >60)
GLUCOSE: 139 mg/dL — AB (ref 70–99)
Potassium: 4 mmol/L (ref 3.5–5.1)
Sodium: 130 mmol/L — ABNORMAL LOW (ref 135–145)

## 2018-05-08 MED ORDER — ALBUTEROL (5 MG/ML) CONTINUOUS INHALATION SOLN
10.0000 mg/h | INHALATION_SOLUTION | Freq: Once | RESPIRATORY_TRACT | Status: AC
  Administered 2018-05-08: 10 mg/h via RESPIRATORY_TRACT
  Filled 2018-05-08: qty 20

## 2018-05-08 MED ORDER — LEVOFLOXACIN IN D5W 500 MG/100ML IV SOLN
500.0000 mg | Freq: Once | INTRAVENOUS | Status: AC
  Administered 2018-05-08: 500 mg via INTRAVENOUS
  Filled 2018-05-08: qty 100

## 2018-05-08 MED ORDER — PREDNISONE 20 MG PO TABS
60.0000 mg | ORAL_TABLET | Freq: Once | ORAL | Status: AC
  Administered 2018-05-08: 60 mg via ORAL
  Filled 2018-05-08: qty 3

## 2018-05-08 MED ORDER — SODIUM CHLORIDE 0.9 % IV SOLN
INTRAVENOUS | Status: DC
  Administered 2018-05-09: via INTRAVENOUS

## 2018-05-08 MED ORDER — ALBUTEROL SULFATE (2.5 MG/3ML) 0.083% IN NEBU
5.0000 mg | INHALATION_SOLUTION | Freq: Once | RESPIRATORY_TRACT | Status: AC
  Administered 2018-05-08: 5 mg via RESPIRATORY_TRACT
  Filled 2018-05-08: qty 6

## 2018-05-08 NOTE — ED Provider Notes (Signed)
[facility] DEPT Provider Note   CSN: [id number] Arrival date & time: 05/08/18  2028     History   Chief Complaint Chief Complaint  Patient presents with  . Shortness of Breath    HPI [name] is a 72 y.o. female.  HPI   She presents for evaluation of shortness of breath.  Symptoms worsening despite being treated by her pulmonologist, twice last week.  Initially she was treated with prednisone and Z-Pak, 1 week ago, then was given a second round of steroids, 3 days ago.  This time as a taper.  She is coughing and producing sputum which is green to yellow in color.  She denies fever.  She complains of chest tightness.  She denies nausea, vomiting, focal weakness or paresthesia.  She has been using her oxygen 24/7, the last several days when she typically uses it just at night.  She has nebulizer, DuoNeb which she uses at home in addition to her other inhalers.  Not currently a smoker.  There are no other known modifying factors.  Past Medical History:  Diagnosis Date  . Abrasion    under left knee x 2 places changing dressing q day of bid, applying ssd cream area healing due to fell 2 weeks ago  . Blood dyscrasia    BLEEDS EASY  . Bruises easily    bleeds easily  . Closed patellar sleeve fracture of left knee 01/04/2018  . Closed patellar sleeve fracture of right knee 01/04/2018   healing   . COPD (chronic obstructive pulmonary disease) ([place])   . Coronary artery disease    per dr Philis Kendall note 01-01-18 note  . Hernia, inguinal    LEFT  . Hypertension    states under control with meds., has been on med. > 10 yr.  . On home oxygen therapy    at night 2L/min  . Pneumonia    "SEVERAL TIMES IN PAST, LAST TIME 2018"  . Pulmonary hypertension ([place])    per dr Terrence Dupont 7-19 19 lov note  . Shortness of breath dyspnea    with exertion  . SVD (spontaneous vaginal delivery)    x 1  . Thin skin   . Urinary tract infection 2019  . Wears  partial dentures    lower partial and upper plate    Patient Active Problem List   Diagnosis Date Noted  . Cystocele with prolapse 03/02/2018  . Cor pulmonale, chronic ([place]) 06/29/2017  . CAP (community acquired pneumonia) 01/22/2015  . Chronic respiratory failure with hypoxia ([place]) 06/30/2014  . Pulmonary infiltrates 01/26/2013  . Nodule of right lung 08/19/2012  . Cancer screening 05/12/2012  . COPD exacerbation ([place]) 05/12/2012  . Essential hypertension 03/28/2012  . COPD GOLD III 12/02/2011  . Chronic cough 12/02/2011  . Cigarette smoker 12/02/2011    Past Surgical History:  Procedure Laterality Date  . COLONOSCOPY    . CYSTOCELE REPAIR N/A 03/02/2018   Procedure: ANTERIOR REPAIR (CYSTOCELE);  Surgeon: Cheri Fowler, MD;  Location: [facility];  Service: Gynecology;  Laterality: N/A;  OUTPATIENT IN BED  . MULTIPLE TOOTH EXTRACTIONS    . RESECTION DISTAL CLAVICAL Left 10/12/2014   Procedure: DISTAL CLAVICLE EXCISION;  Surgeon: Kathryne Hitch, MD;  Location: [facility];  Service: Orthopedics;  Laterality: Left;  . SHOULDER ARTHROSCOPY WITH ROTATOR CUFF REPAIR AND SUBACROMIAL DECOMPRESSION Left 10/12/2014   Procedure: LEFT SHOULDER ARTHROSCOPY, DEBRIDEMENT WITH ACROMIOPLASTY,  ROTATOR CUFF REPAIR AND RELEASE BICEPS TENDON;  Surgeon:  Kathryne Hitch, MD;  Location: [facility];  Service: Orthopedics;  Laterality: Left;     OB History   None      Home Medications    Prior to Admission medications   Medication Sig Start Date End Date Taking? Authorizing Provider  albuterol (PROAIR HFA) 108 (90 Base) MCG/ACT inhaler 2 puffs every 4 hours as needed only  if your can't catch your breath 03/27/17   Tanda Rockers, MD  albuterol (PROVENTIL) (2.5 MG/3ML) 0.083% nebulizer solution Take 3 mLs (2.5 mg total) by nebulization every 6 (six) hours as needed for wheezing or shortness of breath. 10/08/15   Tanda Rockers, MD  Ascorbic Acid (VITAMIN C WITH ROSE HIPS)  500 MG tablet Take 500 mg by mouth daily.    [provider]  azithromycin (ZITHROMAX) 250 MG tablet Zpack taper as directed 05/03/18   Martyn Ehrich, NP  budesonide-formoterol [facility]) 160-4.5 MCG/ACT inhaler Inhale 2 puffs into the lungs 2 (two) times daily. 02/16/18   Tanda Rockers, MD  buPROPion (WELLBUTRIN SR) 150 MG 12 hr tablet Take 1 tablet by mouth daily.  05/26/17   [provider]  cholecalciferol (VITAMIN D) 1000 UNITS tablet Take 1,000 Units by mouth daily.    [provider]  diltiazem (CARDIZEM CD) 360 MG 24 hr capsule Take 360 mg by mouth daily.  12/05/17   [provider]  furosemide (LASIX) 20 MG tablet Take 20 mg by mouth daily as needed for fluid.    [provider]  Garlic 389 MG TABS Take 1 tablet by mouth daily.    [provider]  HYDROcodone-acetaminophen (NORCO/VICODIN) 5-325 MG tablet Take 1 tablet by mouth every 4 (four) hours as needed. 05/06/18   Tanda Rockers, MD  losartan (COZAAR) 100 MG tablet Take 100 mg by mouth daily. 12/10/15   [provider]  Multiple Minerals-Vitamins (CAL MAG ZINC +D3 PO) Take 1 tablet by mouth at bedtime.    [provider]  ondansetron (ZOFRAN) 4 MG tablet Take 1 tablet (4 mg total) by mouth every 8 (eight) hours as needed for nausea or vomiting. 01/22/15   Tanda Rockers, MD  OXYGEN Inhale 2 L into the lungs at bedtime.    [provider]  potassium chloride (K-DUR) 10 MEQ tablet Take 1 tablet by mouth daily as needed (swelling). When takes lasix  10/13/17   [provider]  predniSONE (DELTASONE) 10 MG tablet Take  4 each am x 2 days,   2 each am x 2 days,  1 each am x 2 days and stop 05/06/18   Tanda Rockers, MD  Respiratory Therapy Supplies (FLUTTER) DEVI Use as directed 02/05/16   Tanda Rockers, MD  Tiotropium Bromide Monohydrate (SPIRIVA RESPIMAT) 2.5 MCG/ACT AERS Inhale 2 puffs into the lungs daily. 05/03/18   Martyn Ehrich, NP     Family History Family History  Problem Relation Age of Onset  . Heart disease Father   . Kidney failure Father     Social History Social History   Tobacco Use  . Smoking status: Former Smoker    Packs/day: 1.00    Years: 57.00    Pack years: 57.00    Types: Cigarettes    Last attempt to quit: 03/20/2017    Years since quitting: 1.1  . Smokeless tobacco: Never Used  . Tobacco comment: <1ppd 02/05/2016  Substance Use Topics  . Alcohol use: Yes    Alcohol/week: 28.0 standard  drinks    Types: 14 Cans of beer, 14 Shots of liquor per week    Comment: several beers/day and "a shot of whiskey"  . Drug use: No     Allergies   Tramadol   Review of Systems Review of Systems  All other systems reviewed and are negative.    Physical Exam Updated Vital Signs BP (!) 159/94   Pulse 88   Temp 98.4 F (36.9 C) (Oral)   Resp (!) 22   Ht _0  (1.575 m)   Wt 47.6 kg   SpO2 100%   BMI 19.20 kg/m   Physical Exam  Constitutional: She is oriented to person, place, and time. She appears well-developed. She appears ill.  Frail, elderly  HENT:  Head: Normocephalic and atraumatic.  Eyes: Pupils are equal, round, and reactive to light. Conjunctivae and EOM are normal.  Neck: Normal range of motion and phonation normal. Neck supple.  Cardiovascular: Normal rate and regular rhythm.  Pulmonary/Chest: Effort normal. No accessory muscle usage. Tachypnea noted. No respiratory distress. She has decreased breath sounds in the right middle field, the right lower field, the left middle field and the left lower field. She has wheezes in the right upper field, the right middle field, the left upper field and the left middle field. She has rhonchi in the right upper field and the left upper field. She has no rales. She exhibits no tenderness.  Abdominal: Soft. She exhibits no distension. There is no tenderness. There is no guarding.  Musculoskeletal: Normal range of motion.       Right lower  leg: She exhibits no tenderness and no edema.       Left lower leg: She exhibits no tenderness and no edema.  Neurological: She is alert and oriented to person, place, and time. She exhibits normal muscle tone.  Skin: Skin is warm and dry.  Psychiatric: She has a normal mood and affect. Her behavior is normal. Judgment and thought content normal.  Nursing note and vitals reviewed.    ED Treatments / Results  Labs (all labs ordered are listed, but only abnormal results are displayed) Labs Reviewed  BASIC METABOLIC PANEL - Abnormal; Notable for the following components:      Result Value   Sodium 130 (*)    Chloride 91 (*)    Glucose, Bld 139 (*)    Creatinine, Ser 0.40 (*)    All other components within normal limits  CBC    EKG EKG Interpretation  Date/Time:  Saturday May 08 2018 21:22:45 EST Ventricular Rate:  90 PR Interval:    QRS Duration: 100 QT Interval:  345 QTC Calculation: 423 R Axis:   86 Text Interpretation:  Sinus rhythm Ventricular premature complex Anterior infarct, old Since last tracing pvc is new Confirmed by Daleen Bo [phone]) on 05/08/2018 9:27:05 PM   Radiology Dg Chest Port 1 View  Result Date: 05/08/2018 CLINICAL DATA:  Worsening shortness of breath and cough. Previous smoker. History of hypertension. EXAM: PORTABLE CHEST 1 VIEW COMPARISON:  02/03/2018 FINDINGS: Cardiac enlargement. No vascular congestion, edema, or consolidation. Diffuse emphysematous changes in the lungs. Scattered fibrosis. No blunting of costophrenic angles. No pneumothorax. Mediastinal contours appear intact. IMPRESSION: Cardiac enlargement. Emphysematous changes and fibrosis in the lungs. No evidence of active pulmonary disease. Electronically Signed   By: Lucienne Capers M.D.   On: 05/08/2018 21:59    Procedures .Critical Care Performed by: Daleen Bo, MD Authorized by: Daleen Bo, MD   Critical care  provider statement:    Critical care time (minutes):   35   Critical care start time:  05/08/2018 9:20 PM   Critical care end time:  05/08/2018 11:47 PM   Critical care time was exclusive of:  Separately billable procedures and treating other patients   Critical care was necessary to treat or prevent imminent or life-threatening deterioration of the following conditions:  Respiratory failure   Critical care was time spent personally by me on the following activities:  Blood draw for specimens, development of treatment plan with patient or surrogate, discussions with consultants, evaluation of patient's response to treatment, examination of patient, obtaining history from patient or surrogate, ordering and performing treatments and interventions, ordering and review of laboratory studies, pulse oximetry, re-evaluation of patient's condition, review of old charts and ordering and review of radiographic studies   (including critical care time)  Medications Ordered in ED Medications  levofloxacin (LEVAQUIN) IVPB 500 mg ( Intravenous Rate/Dose Verify 05/08/18 2330)  0.9 %  sodium chloride infusion (has no administration in time range)  albuterol (PROVENTIL) (2.5 MG/3ML) 0.083% nebulizer solution 5 mg (5 mg Nebulization Given 05/08/18 2130)  predniSONE (DELTASONE) tablet 60 mg (60 mg Oral Given 05/08/18 2151)  albuterol (PROVENTIL,VENTOLIN) solution continuous neb (10 mg/hr Nebulization Given 05/08/18 2238)     Initial Impression / Assessment and Plan / ED Course  I have reviewed the triage vital signs and the nursing notes.  Pertinent labs & imaging results that were available during my care of the patient were reviewed by me and considered in my medical decision making (see chart for details).  Clinical Course as of May 08 2340  Sat May 08, 2018  2207 No infiltrate or CHF, images reviewed by me  DG Chest [place] 1 View [EW]    Clinical Course User Index [EW] Daleen Bo, MD     Patient Vitals for the past 24 hrs:  BP Temp Temp src Pulse  Resp SpO2 Height Weight  05/08/18 2315 (!) 159/94 - - 88 (!) 22 100 % - -  05/08/18 2308 (!) 157/82 - - 87 (!) 24 100 % - -  05/08/18 2238 - - - - - 92 % - -  05/08/18 2234 (!) 147/81 - - 84 (!) 27 95 % - -  05/08/18 2230 (!) 145/97 - - - 18 97 % - -  05/08/18 2215 (!) 151/83 - - - (!) 25 97 % - -  05/08/18 2200 (!) 171/81 - - - 18 97 % - -  05/08/18 2130 - - - - - 95 % - -  05/08/18 2126 (!) 169/88 - - 90 (!) 21 97 % - -  05/08/18 2035 - - - - - - _0  (1.575 m) 47.6 kg  05/08/18 2033 (!) 197/96 98.4 F (36.9 C) Oral 92 (!) 26 95 % - -    11:41 PM Reevaluation with update and discussion. After initial assessment and treatment, an updated evaluation reveals following completion of continuous nebulizer, patient is somewhat more dyspneic with increased work of breathing.  She cannot be dispositioned at this point.  I ordered IV fluids, and will observe another hour or so.  Patient agreeable to this plan, she still wants to go home if possible. Daleen Bo   Medical Decision Making: Acute exacerbation, presenting on prednisone taper, after being started a second round of prednisone, and receiving an oral antibiotic, macrolide.  Multiple nebulizers started ED.  Prednisone boost given.  Patient was hypoxic on presentation,  she came by private vehicle without using oxygen during that transport.  Quinolone antibiotic started by IV.  Multiple nebulizers given in the emergency department.  CRITICAL CARE-yes Performed by: Daleen Bo   Nursing Notes Reviewed/ Care Coordinated Applicable Imaging Reviewed Interpretation of Laboratory Data incorporated into ED treatment  Plan-reassessment by oncoming provider team at 12:45 AM to consider disposition home versus admission   Final Clinical Impressions(s) / ED Diagnoses   Final diagnoses:  COPD exacerbation [facility])    ED Discharge Orders    None       Daleen Bo, MD 05/08/18 2347

## 2018-05-08 NOTE — ED Triage Notes (Signed)
Pt to ed with c/o if worsening SOB all week. Pt was at PCP Monday and received prednisone and antibiotics. Pt PCP told her to come to ED if not getting better. Today Pt worsened and couldn't catch her breath all day on exertion. Pt Sp02 on arrival was 82%, placed on 4L and now 97%. Pt states normally wears 2L at home but has  Had to wear 4L.Pt has hx of COPD.

## 2018-05-08 NOTE — ED Notes (Signed)
EKG given to EDP,Floyd,MD., for review.

## 2018-05-09 ENCOUNTER — Other Ambulatory Visit: Payer: Self-pay

## 2018-05-09 ENCOUNTER — Encounter (HOSPITAL_COMMUNITY): Payer: Self-pay | Admitting: Family Medicine

## 2018-05-09 DIAGNOSIS — J441 Chronic obstructive pulmonary disease with (acute) exacerbation: Secondary | ICD-10-CM | POA: Diagnosis present

## 2018-05-09 DIAGNOSIS — I10 Essential (primary) hypertension: Secondary | ICD-10-CM | POA: Diagnosis not present

## 2018-05-09 DIAGNOSIS — E871 Hypo-osmolality and hyponatremia: Secondary | ICD-10-CM | POA: Diagnosis not present

## 2018-05-09 DIAGNOSIS — J9611 Chronic respiratory failure with hypoxia: Secondary | ICD-10-CM | POA: Diagnosis not present

## 2018-05-09 DIAGNOSIS — R338 Other retention of urine: Secondary | ICD-10-CM | POA: Diagnosis present

## 2018-05-09 LAB — URINALYSIS, ROUTINE W REFLEX MICROSCOPIC
Bilirubin Urine: NEGATIVE
Glucose, UA: 150 mg/dL — AB
Hgb urine dipstick: NEGATIVE
KETONES UR: 5 mg/dL — AB
Leukocytes, UA: NEGATIVE
NITRITE: NEGATIVE
PH: 7 (ref 5.0–8.0)
PROTEIN: NEGATIVE mg/dL
SPECIFIC GRAVITY, URINE: 1.005 (ref 1.005–1.030)

## 2018-05-09 LAB — BLOOD GAS, ARTERIAL
ACID-BASE EXCESS: 6 mmol/L — AB (ref 0.0–2.0)
BICARBONATE: 31.2 mmol/L — AB (ref 20.0–28.0)
DRAWN BY: 232811
O2 Content: 2 L/min
O2 Saturation: 98.4 %
PATIENT TEMPERATURE: 97.9
pCO2 arterial: 48.8 mmHg — ABNORMAL HIGH (ref 32.0–48.0)
pH, Arterial: 7.421 (ref 7.350–7.450)
pO2, Arterial: 127 mmHg — ABNORMAL HIGH (ref 83.0–108.0)

## 2018-05-09 LAB — MRSA PCR SCREENING: MRSA BY PCR: NEGATIVE

## 2018-05-09 LAB — BASIC METABOLIC PANEL
Anion gap: 9 (ref 5–15)
BUN: 14 mg/dL (ref 8–23)
CHLORIDE: 93 mmol/L — AB (ref 98–111)
CO2: 31 mmol/L (ref 22–32)
CREATININE: 0.38 mg/dL — AB (ref 0.44–1.00)
Calcium: 8.7 mg/dL — ABNORMAL LOW (ref 8.9–10.3)
GFR calc Af Amer: >60 mL/min (ref >60)
GFR calc non Af Amer: >60 mL/min (ref >60)
Glucose, Bld: 189 mg/dL — ABNORMAL HIGH (ref 70–99)
POTASSIUM: 4 mmol/L (ref 3.5–5.1)
SODIUM: 133 mmol/L — AB (ref 135–145)

## 2018-05-09 LAB — GLUCOSE, CAPILLARY: GLUCOSE-CAPILLARY: 177 mg/dL — AB (ref 70–99)

## 2018-05-09 MED ORDER — SODIUM CHLORIDE 0.9% FLUSH
3.0000 mL | Freq: Two times a day (BID) | INTRAVENOUS | Status: DC
  Administered 2018-05-09 – 2018-05-12 (×8): 3 mL via INTRAVENOUS
  Administered 2018-05-12: 10 mL via INTRAVENOUS
  Administered 2018-05-14 – 2018-05-20 (×13): 3 mL via INTRAVENOUS

## 2018-05-09 MED ORDER — UMECLIDINIUM BROMIDE 62.5 MCG/INH IN AEPB
1.0000 | INHALATION_SPRAY | Freq: Every day | RESPIRATORY_TRACT | Status: DC
  Administered 2018-05-10 – 2018-05-19 (×10): 1 via RESPIRATORY_TRACT
  Filled 2018-05-09 (×2): qty 7

## 2018-05-09 MED ORDER — ORAL CARE MOUTH RINSE
15.0000 mL | Freq: Two times a day (BID) | OROMUCOSAL | Status: DC
  Administered 2018-05-09 – 2018-05-20 (×13): 15 mL via OROMUCOSAL

## 2018-05-09 MED ORDER — HYDRALAZINE HCL 20 MG/ML IJ SOLN
10.0000 mg | INTRAMUSCULAR | Status: DC | PRN
  Administered 2018-05-12: 10 mg via INTRAVENOUS
  Filled 2018-05-09: qty 1

## 2018-05-09 MED ORDER — IPRATROPIUM-ALBUTEROL 0.5-2.5 (3) MG/3ML IN SOLN: 3.0000 mL | Freq: Four times a day (QID) | RESPIRATORY_TRACT | Status: DC

## 2018-05-09 MED ORDER — MOMETASONE FURO-FORMOTEROL FUM 200-5 MCG/ACT IN AERO
2.0000 | INHALATION_SPRAY | Freq: Two times a day (BID) | RESPIRATORY_TRACT | Status: DC
  Administered 2018-05-09 – 2018-05-13 (×8): 2 via RESPIRATORY_TRACT
  Filled 2018-05-09: qty 8.8

## 2018-05-09 MED ORDER — ACETAMINOPHEN 650 MG RE SUPP: 650.0000 mg | Freq: Four times a day (QID) | RECTAL | Status: DC | PRN

## 2018-05-09 MED ORDER — VITAMIN D3 25 MCG (1000 UNIT) PO TABS
1000.0000 [IU] | ORAL_TABLET | Freq: Every day | ORAL | Status: DC
  Administered 2018-05-09 – 2018-05-20 (×12): 1000 [IU] via ORAL
  Filled 2018-05-09 (×13): qty 1

## 2018-05-09 MED ORDER — ALBUTEROL SULFATE (2.5 MG/3ML) 0.083% IN NEBU
2.5000 mg | INHALATION_SOLUTION | Freq: Three times a day (TID) | RESPIRATORY_TRACT | Status: DC
  Administered 2018-05-09 – 2018-05-10 (×3): 2.5 mg via RESPIRATORY_TRACT
  Filled 2018-05-09 (×3): qty 3

## 2018-05-09 MED ORDER — ADULT MULTIVITAMIN W/MINERALS CH
1.0000 | ORAL_TABLET | Freq: Every day | ORAL | Status: DC
  Administered 2018-05-09 – 2018-05-20 (×11): 1 via ORAL
  Filled 2018-05-09 (×12): qty 1

## 2018-05-09 MED ORDER — THIAMINE HCL 100 MG/ML IJ SOLN: 100.0000 mg | Freq: Every day | INTRAMUSCULAR | Status: DC

## 2018-05-09 MED ORDER — LORAZEPAM 1 MG PO TABS: 1.0000 mg | ORAL_TABLET | Freq: Four times a day (QID) | ORAL | Status: AC | PRN

## 2018-05-09 MED ORDER — LORAZEPAM 2 MG/ML IJ SOLN
0.0000 mg | Freq: Four times a day (QID) | INTRAMUSCULAR | Status: AC
  Administered 2018-05-09: 2 mg via INTRAVENOUS
  Filled 2018-05-09: qty 1

## 2018-05-09 MED ORDER — LORAZEPAM 2 MG/ML IJ SOLN
0.0000 mg | Freq: Two times a day (BID) | INTRAMUSCULAR | Status: AC
  Administered 2018-05-13: 2 mg via INTRAVENOUS
  Filled 2018-05-09: qty 1

## 2018-05-09 MED ORDER — ONDANSETRON HCL 4 MG/2ML IJ SOLN
4.0000 mg | Freq: Four times a day (QID) | INTRAMUSCULAR | Status: DC | PRN
  Filled 2018-05-09: qty 2

## 2018-05-09 MED ORDER — DILTIAZEM HCL ER COATED BEADS 180 MG PO CP24
360.0000 mg | ORAL_CAPSULE | Freq: Every day | ORAL | Status: DC
  Administered 2018-05-09 – 2018-05-20 (×12): 360 mg via ORAL
  Filled 2018-05-09 (×12): qty 2

## 2018-05-09 MED ORDER — ONDANSETRON HCL 4 MG PO TABS: 4.0000 mg | ORAL_TABLET | Freq: Four times a day (QID) | ORAL | Status: DC | PRN

## 2018-05-09 MED ORDER — HYDROCODONE-ACETAMINOPHEN 5-325 MG PO TABS
1.0000 | ORAL_TABLET | ORAL | Status: DC | PRN
  Filled 2018-05-09: qty 1

## 2018-05-09 MED ORDER — ENOXAPARIN SODIUM 40 MG/0.4ML ~~LOC~~ SOLN
40.0000 mg | SUBCUTANEOUS | Status: DC
  Administered 2018-05-09 – 2018-05-20 (×12): 40 mg via SUBCUTANEOUS
  Filled 2018-05-09 (×12): qty 0.4

## 2018-05-09 MED ORDER — FOLIC ACID 1 MG PO TABS
1.0000 mg | ORAL_TABLET | Freq: Every day | ORAL | Status: DC
  Administered 2018-05-09 – 2018-05-20 (×12): 1 mg via ORAL
  Filled 2018-05-09 (×12): qty 1

## 2018-05-09 MED ORDER — SODIUM CHLORIDE 0.9 % IV SOLN
INTRAVENOUS | Status: AC
  Administered 2018-05-09: 02:00:00 via INTRAVENOUS

## 2018-05-09 MED ORDER — METHYLPREDNISOLONE SODIUM SUCC 125 MG IJ SOLR
60.0000 mg | Freq: Four times a day (QID) | INTRAMUSCULAR | Status: DC
  Administered 2018-05-09 – 2018-05-10 (×5): 60 mg via INTRAVENOUS
  Filled 2018-05-09 (×4): qty 2

## 2018-05-09 MED ORDER — VITAMIN B-1 100 MG PO TABS
100.0000 mg | ORAL_TABLET | Freq: Every day | ORAL | Status: DC
  Administered 2018-05-09 – 2018-05-20 (×11): 100 mg via ORAL
  Filled 2018-05-09 (×12): qty 1

## 2018-05-09 MED ORDER — LOSARTAN POTASSIUM 50 MG PO TABS
100.0000 mg | ORAL_TABLET | Freq: Every day | ORAL | Status: DC
  Administered 2018-05-09 – 2018-05-20 (×12): 100 mg via ORAL
  Filled 2018-05-09 (×12): qty 2

## 2018-05-09 MED ORDER — BUPROPION HCL ER (SR) 150 MG PO TB12
150.0000 mg | ORAL_TABLET | Freq: Every day | ORAL | Status: DC
  Administered 2018-05-09 – 2018-05-20 (×12): 150 mg via ORAL
  Filled 2018-05-09 (×12): qty 1

## 2018-05-09 MED ORDER — ACETAMINOPHEN 325 MG PO TABS: 650.0000 mg | ORAL_TABLET | Freq: Four times a day (QID) | ORAL | Status: DC | PRN

## 2018-05-09 MED ORDER — ALBUTEROL SULFATE (2.5 MG/3ML) 0.083% IN NEBU: 2.5000 mg | INHALATION_SOLUTION | RESPIRATORY_TRACT | Status: DC | PRN

## 2018-05-09 MED ORDER — METHYLPREDNISOLONE SODIUM SUCC 125 MG IJ SOLR
125.0000 mg | Freq: Once | INTRAMUSCULAR | Status: AC
  Administered 2018-05-09: 125 mg via INTRAVENOUS
  Filled 2018-05-09: qty 2

## 2018-05-09 MED ORDER — TIOTROPIUM BROMIDE MONOHYDRATE 2.5 MCG/ACT IN AERS: 2.0000 | INHALATION_SPRAY | Freq: Every day | RESPIRATORY_TRACT | Status: DC

## 2018-05-09 MED ORDER — ALBUTEROL (5 MG/ML) CONTINUOUS INHALATION SOLN
15.0000 mg/h | INHALATION_SOLUTION | Freq: Once | RESPIRATORY_TRACT | Status: AC
  Administered 2018-05-09: 15 mg/h via RESPIRATORY_TRACT

## 2018-05-09 MED ORDER — LORAZEPAM 2 MG/ML IJ SOLN: 1.0000 mg | Freq: Four times a day (QID) | INTRAMUSCULAR | Status: AC | PRN

## 2018-05-09 MED ORDER — LEVOFLOXACIN IN D5W 500 MG/100ML IV SOLN
500.0000 mg | INTRAVENOUS | Status: AC
  Administered 2018-05-09 – 2018-05-12 (×4): 500 mg via INTRAVENOUS
  Filled 2018-05-09 (×4): qty 100

## 2018-05-09 MED ORDER — SENNOSIDES-DOCUSATE SODIUM 8.6-50 MG PO TABS: 1.0000 | ORAL_TABLET | Freq: Every evening | ORAL | Status: DC | PRN

## 2018-05-09 NOTE — H&P (Signed)
History and Physical    LAYTON TAPPAN MPN:[id number] DOB: August 15, 1945 DOA: 05/08/2018  PCP: Christain Sacramento, MD   Patient coming from: Home   Chief Complaint: SOB, cough   HPI: [name] is a 72 y.o. female with medical history significant for COPD with chronic hypoxic respiratory failure, hypertension, and chronic bilateral leg swelling and ulcerations, now presenting to the emergency department for evaluation of shortness of breath and productive cough.  Patient was seen in her pulmonology clinic on 05/03/2018 with productive cough and increased dyspnea, was diagnosed with acute exacerbation in COPD and started on prednisone and azithromycin.  She failed to improve, was seen back on 05/06/2018 and reports that her prednisone course was extended.  Unfortunately, she still has not had any significant improvement and may actually be worsening.  She has been dyspneic at rest for the past couple days and has had difficulty sleeping due to this.  She denies fevers, chills, or chest pain.  Reports that her chronic bilateral lower extremity swelling is not as bad as usual.  ED Course: Upon arrival to the ED, patient is found to be afebrile, saturating low 90s on 4 L/min supplemental oxygen, tachypneic, and with vitals otherwise stable.  EKG features sinus rhythm with PVC and chest x-ray is notable for cardiomegaly, emphysematous and fibrotic changes, but no acute findings.  Chemistry panel is notable for sodium of 130.  CBC is unremarkable.  Patient was given 3 continuous albuterol treatments, prednisone, Levaquin, and gentle IV fluid hydration with normal saline.  She reports some slight improvement with this, and wanted to go home initially, but remains dyspneic at rest and agrees to be observed in the hospital for further evaluation and management.  Review of Systems:  All other systems reviewed and apart from HPI, are negative.  Past Medical History:  Diagnosis Date  . Abrasion    under left  knee x 2 places changing dressing q day of bid, applying ssd cream area healing due to fell 2 weeks ago  . Blood dyscrasia    BLEEDS EASY  . Bruises easily    bleeds easily  . Closed patellar sleeve fracture of left knee 01/04/2018  . Closed patellar sleeve fracture of right knee 01/04/2018   healing   . COPD (chronic obstructive pulmonary disease) ([place])   . Coronary artery disease    per dr Philis Kendall note 01-01-18 note  . Hernia, inguinal    LEFT  . Hypertension    states under control with meds., has been on med. > 10 yr.  . On home oxygen therapy    at night 2L/min  . Pneumonia    "SEVERAL TIMES IN PAST, LAST TIME 2018"  . Pulmonary hypertension ([place])    per dr Terrence Dupont 7-19 19 lov note  . Shortness of breath dyspnea    with exertion  . SVD (spontaneous vaginal delivery)    x 1  . Thin skin   . Urinary tract infection 2019  . Wears partial dentures    lower partial and upper plate    Past Surgical History:  Procedure Laterality Date  . COLONOSCOPY    . CYSTOCELE REPAIR N/A 03/02/2018   Procedure: ANTERIOR REPAIR (CYSTOCELE);  Surgeon: Cheri Fowler, MD;  Location: [facility];  Service: Gynecology;  Laterality: N/A;  OUTPATIENT IN BED  . MULTIPLE TOOTH EXTRACTIONS    . RESECTION DISTAL CLAVICAL Left 10/12/2014   Procedure: DISTAL CLAVICLE EXCISION;  Surgeon: Kathryne Hitch, MD;  Location: [facility];  Service: Orthopedics;  Laterality: Left;  . SHOULDER ARTHROSCOPY WITH ROTATOR CUFF REPAIR AND SUBACROMIAL DECOMPRESSION Left 10/12/2014   Procedure: LEFT SHOULDER ARTHROSCOPY, DEBRIDEMENT WITH ACROMIOPLASTY,  ROTATOR CUFF REPAIR AND RELEASE BICEPS TENDON;  Surgeon: Kathryne Hitch, MD;  Location: [facility] [facility];  Service: Orthopedics;  Laterality: Left;     reports that she quit smoking about 13 months ago. Her smoking use included cigarettes. She has a 57.00 pack-year smoking history. She has never used smokeless tobacco. She reports that she drinks about  28.0 standard drinks of alcohol per week. She reports that she does not use drugs.  Allergies  Allergen Reactions  . Tramadol Other (See Comments)    GI UPSET    Family History  Problem Relation Age of Onset  . Heart disease Father   . Kidney failure Father      Prior to Admission medications   Medication Sig Start Date End Date Taking? Authorizing Provider  albuterol (PROAIR HFA) 108 (90 Base) MCG/ACT inhaler 2 puffs every 4 hours as needed only  if your can't catch your breath Patient taking differently: Inhale 2 puffs into the lungs every 4 (four) hours as needed (if unable to catch breath). 2 puffs every 4 hours as needed only  if your can't catch your breath 03/27/17  Yes Tanda Rockers, MD  albuterol (PROVENTIL) (2.5 MG/3ML) 0.083% nebulizer solution Take 3 mLs (2.5 mg total) by nebulization every 6 (six) hours as needed for wheezing or shortness of breath. 10/08/15  Yes Tanda Rockers, MD  Ascorbic Acid (VITAMIN C WITH ROSE HIPS) 500 MG tablet Take 500 mg by mouth daily.   Yes [provider]  budesonide-formoterol (SYMBICORT) 160-4.5 MCG/ACT inhaler Inhale 2 puffs into the lungs 2 (two) times daily. 02/16/18  Yes Tanda Rockers, MD  buPROPion [facility] SR) 150 MG 12 hr tablet Take 150 mg by mouth daily.  05/26/17  Yes [provider]  cholecalciferol (VITAMIN D) 1000 UNITS tablet Take 1,000 Units by mouth daily.   Yes [provider]  diltiazem (CARDIZEM CD) 360 MG 24 hr capsule Take 360 mg by mouth daily.  12/05/17  Yes [provider]  furosemide (LASIX) 20 MG tablet Take 20 mg by mouth daily as needed for fluid.   Yes [provider]  Garlic 539 MG TABS Take 1 tablet by mouth daily.   Yes [provider]  HYDROcodone-acetaminophen (NORCO/VICODIN) 5-325 MG tablet Take 1 tablet by mouth every 4 (four) hours as needed. Patient taking differently: Take 1 tablet by mouth every 4 (four) hours as needed for moderate pain or severe  pain.  05/06/18  Yes Tanda Rockers, MD  losartan (COZAAR) 100 MG tablet Take 100 mg by mouth daily. 12/10/15  Yes [provider]  Multiple Minerals-Vitamins (CAL MAG ZINC +D3 PO) Take 1 tablet by mouth at bedtime.   Yes [provider]  ondansetron (ZOFRAN) 4 MG tablet Take 1 tablet (4 mg total) by mouth every 8 (eight) hours as needed for nausea or vomiting. 01/22/15  Yes Tanda Rockers, MD  OXYGEN Inhale 2 L into the lungs at bedtime.   Yes [provider]  potassium chloride (K-DUR) 10 MEQ tablet Take 10 mEq by mouth daily as needed (swelling). When takes lasix  10/13/17  Yes [provider]  predniSONE (DELTASONE) 10 MG tablet Take  4 each am x 2 days,   2 each am x 2 days,  1 each am x  2 days and stop 05/06/18  Yes Tanda Rockers, MD  Respiratory Therapy Supplies (FLUTTER) DEVI Use as directed 02/05/16  Yes Tanda Rockers, MD  Tiotropium Bromide Monohydrate (SPIRIVA RESPIMAT) 2.5 MCG/ACT AERS Inhale 2 puffs into the lungs daily. 05/03/18  Yes Martyn Ehrich, NP    Physical Exam: Vitals:   05/09/18 0130 05/09/18 0145 05/09/18 0200 05/09/18 0206  BP: (!) 151/76 (!) 143/76 (!) 159/83   Pulse: 86 81 91   Resp: 18 17 (!) 22   Temp:      TempSrc:      SpO2: 96% 96% 95% 95%  Weight:      Height:        Constitutional: Not in acute distress, but tachypneic, dyspneic with speech   Eyes: PERTLA, lids and conjunctivae normal ENMT: Mucous membranes are moist. Posterior pharynx clear of any exudate or lesions.   Neck: normal, supple, no masses, no thyromegaly Respiratory: Breath sounds diminished with prolonged expiratory phase, no rales. Mild tachypnea. No pallor or cyanosis.   Cardiovascular: S1 & S2 heard, regular rate and rhythm. Pretibial pitting edema bilaterally. Abdomen: No distension, no tenderness, soft. Bowel sounds active.  Musculoskeletal: no clubbing / cyanosis. No joint deformity upper and lower extremities.    Skin: Superficial  ulcerations involving lower legs bilaterally. Warm, dry, well-perfused. Neurologic: No facial asymmetry. Sensation intact. Moving all extremities.  Psychiatric: Alert and oriented x 3. Calm, cooperative.    Labs on Admission: I have personally reviewed following labs and imaging studies  CBC: Recent Labs  Lab 05/08/18 2105  WBC 7.5  HGB 14.6  HCT 45.2  MCV 90.8  PLT 154   Basic Metabolic Panel: Recent Labs  Lab 05/08/18 2105  NA 130*  K 4.0  CL 91*  CO2 30  GLUCOSE 139*  BUN 20  CREATININE 0.40*  CALCIUM 9.3   GFR: Estimated Creatinine Clearance: 47.8 mL/min (A) (by C-G formula based on SCr of 0.4 mg/dL (L)). Liver Function Tests: No results for input(s): AST, ALT, ALKPHOS, BILITOT, PROT, ALBUMIN in the last 168 hours. No results for input(s): LIPASE, AMYLASE in the last 168 hours. No results for input(s): AMMONIA in the last 168 hours. Coagulation Profile: No results for input(s): INR, PROTIME in the last 168 hours. Cardiac Enzymes: No results for input(s): CKTOTAL, CKMB, CKMBINDEX, TROPONINI in the last 168 hours. BNP (last 3 results) No results for input(s): PROBNP in the last 8760 hours. HbA1C: No results for input(s): HGBA1C in the last 72 hours. CBG: No results for input(s): GLUCAP in the last 168 hours. Lipid Profile: No results for input(s): CHOL, HDL, LDLCALC, TRIG, CHOLHDL, LDLDIRECT in the last 72 hours. Thyroid Function Tests: No results for input(s): TSH, T4TOTAL, FREET4, T3FREE, THYROIDAB in the last 72 hours. Anemia Panel: No results for input(s): VITAMINB12, FOLATE, FERRITIN, TIBC, IRON, RETICCTPCT in the last 72 hours. Urine analysis:    Component Value Date/Time   COLORURINE YELLOW 12/12/2017 1753   APPEARANCEUR HAZY (A) 12/12/2017 1753   LABSPEC 1.021 12/12/2017 1753   PHURINE 5.0 12/12/2017 1753   GLUCOSEU NEGATIVE 12/12/2017 1753   HGBUR NEGATIVE 12/12/2017 1753   BILIRUBINUR NEGATIVE 12/12/2017 1753   KETONESUR NEGATIVE 12/12/2017  1753   PROTEINUR NEGATIVE 12/12/2017 1753   NITRITE NEGATIVE 12/12/2017 1753   LEUKOCYTESUR MODERATE (A) 12/12/2017 1753   Sepsis Labs: _0 (procalcitonin:4,lacticidven:4) )No results found for this or any previous visit (from the past 240 hour(s)).   Radiological Exams on Admission: Dg Chest [facility]  Result Date: 05/08/2018 CLINICAL DATA:  Worsening shortness of breath and cough. Previous smoker. History of hypertension. EXAM: PORTABLE CHEST 1 VIEW COMPARISON:  02/03/2018 FINDINGS: Cardiac enlargement. No vascular congestion, edema, or consolidation. Diffuse emphysematous changes in the lungs. Scattered fibrosis. No blunting of costophrenic angles. No pneumothorax. Mediastinal contours appear intact. IMPRESSION: Cardiac enlargement. Emphysematous changes and fibrosis in the lungs. No evidence of active pulmonary disease. Electronically Signed   By: Lucienne Capers M.D.   On: 05/08/2018 21:59    EKG: Independently reviewed. Sinus rhythm, PVC.   Assessment/Plan   1. COPD with acute exacerbation; chronic hypoxic respiratory failure  - Presents with worsening in SOB and cough despite treatment with prednisone and azithromycin since 05/03/18  - She is afebrile with clear CXR, reports her chronic leg swelling to be less than usual and denies calf tenderness or chest pain  - Treated with continuous albuterol nebs x3, prednisone, and Levaquin in ED, eventually started on BiPAP due to WOB - Check sputum culture, continue systemic steroid with IV Solu-Medrol, continue Levaquin, continue ICS/LABA and LAMA, continue albuterol nebs, transition back to nasal canula as she improves    2. Hyponatremia  - Serum sodium is 130 on admission  - Has chronic leg edema that she reports to be better than usual and is likely hypovolemic  - She is receiving gentle IVF hydration with NS, will continue overnight and repeat chem panel in am   3. Hypertension  - Continue losartan and diltiazem      DVT prophylaxis: Lovenox  Code Status: Full  Family Communication: Discussed with patient  Consults called: none Admission status: Observation     Vianne Bulls, MD [facility] Pager [phone]  If 7PM-7AM, please contact night-coverage [email or web address] Password [email or web address]  05/09/2018, 2:11 AM

## 2018-05-09 NOTE — ED Provider Notes (Signed)
Signed out pending recheck.  In brief, history of COPD.  Been managed as an outpatient this week but worsening.  Was given a DuoNeb and antibiotics increase the Levaquin.  Patient initially desired to go home.  12:54 AM On recheck, patient still tachypneic with poor air movement and wheeze.  She is using accessory muscles.  Discussed with patient that she may benefit from BiPAP for rest.  She states she has been breathing like this for several days.  Given failure of outpatient management, feel that she would benefit from hospitalization.  Patient has agreed.  We will obtain an ABG and placed on BiPAP for comfort.   Physical Exam  BP (!) 150/80   Pulse 92   Temp 98.4 F (36.9 C) (Oral)   Resp (!) 25   Ht 1.575 m (_0 )   Wt 47.6 kg   SpO2 98%   BMI 19.20 kg/m   Physical Exam  Chronically ill-appearing Tachypnea, accessory muscle use, poor air movement with wheeze noted  ED Course/Procedures   Clinical Course as of May 09 53  Sat May 08, 2018  2207 No infiltrate or CHF, images reviewed by me  DG Chest [place] 1 View [EW]    Clinical Course User Index [EW] Daleen Bo, MD    Procedures  CRITICAL CARE Performed by: Merryl Hacker   Total critical care time: 35 minutes  Critical care time was exclusive of separately billable procedures and treating other patients.  Critical care was necessary to treat or prevent imminent or life-threatening deterioration.  Critical care was time spent personally by me on the following activities: development of treatment plan with patient and/or surrogate as well as nursing, discussions with consultants, evaluation of patient's response to treatment, examination of patient, obtaining history from patient or surrogate, ordering and performing treatments and interventions, ordering and review of laboratory studies, ordering and review of radiographic studies, pulse oximetry and re-evaluation of patient's condition.   MDM   She with  continued wheezing and poor air movement.  She has increased work of breathing.  Will trial BiPAP for work of breathing.  She has evidence of chronic respiratory failure on her ABG.  Repeat continuous DuoNeb ordered.  We will plan for admission to the hospitalist.      Merryl Hacker, MD 05/09/18 [phone]

## 2018-05-09 NOTE — Progress Notes (Signed)
Patient removed from BiPAP at this time and placed on 4 L . O2 sats currently 94-95%. BiPAP at bedside if needed. RT will continue to monitor patient.

## 2018-05-09 NOTE — Progress Notes (Signed)
Pt demanding BIPAP to be taken off, Pt placed on 4 LPM [place].  MD aware, RT to monitor and assess as needed.

## 2018-05-09 NOTE — Progress Notes (Signed)
PROGRESS NOTE    [name]  CHE:[id number] DOB: 1945-11-19 DOA: 05/08/2018 PCP: Christain Sacramento, MD   Brief Narrative:  HPI on 05/09/2018 by Dr. Mitzi Hansen [name] is a 72 y.o. female with medical history significant for COPD with chronic hypoxic respiratory failure, hypertension, and chronic bilateral leg swelling and ulcerations, now presenting to the emergency department for evaluation of shortness of breath and productive cough.  Patient was seen in her pulmonology clinic on 05/03/2018 with productive cough and increased dyspnea, was diagnosed with acute exacerbation in COPD and started on prednisone and azithromycin.  She failed to improve, was seen back on 05/06/2018 and reports that her prednisone course was extended.  Unfortunately, she still has not had any significant improvement and may actually be worsening.  She has been dyspneic at rest for the past couple days and has had difficulty sleeping due to this.  She denies fevers, chills, or chest pain.  Reports that her chronic bilateral lower extremity swelling is not as bad as usual.  Assessment & Plan   Admitted earlier today by Dr. Myna Hidalgo. See H&P for details.  Acute on chronic hypoxic respiratory failure with COPD Exacerbation -Presented with worsening shortness of breath, cough despite being treated with prednisone and azithromycin since 05/03/2018 -Afebrile on admission -Chest x-ray reviewed, unremarkable -Patient did require BiPAP when admitted -Was treated with albuterol nebulizers x3, prednisone as well as Levaquin in the ED -Pending sputum culture -Continue nebulizer treatments, Solu-Medrol, Levaquin -Addition to nasal cannula as possible  Hyponatremia -Sodium 130 on admission -Has chronic leg edema -Currently on gentle IV fluids, will continue to monitor BMP  Essential hypertension -Continue diltiazem, losartan  Alcohol use -Regarding this and she recently received Ativan -Continue CIWA  protocol  DVT Prophylaxis  lovenox  Code Status: Full  Family Communication: None at bedside  Disposition Plan: Currently in observation. Suspect home when medically stable  Consultants None  Procedures  None  Antibiotics   Anti-infectives (From admission, onward)   Start     Dose/Rate Route Frequency Ordered Stop   05/09/18 2200  levofloxacin (LEVAQUIN) IVPB 500 mg     500 mg 100 mL/hr over 60 Minutes Intravenous Every 24 hours 05/09/18 0211 05/14/18 2159   05/08/18 2215  levofloxacin (LEVAQUIN) IVPB 500 mg     500 mg 100 mL/hr over 60 Minutes Intravenous  Once 05/08/18 2214 05/08/18 2343      Subjective:   [name] seen and examined today.  Patient was sleeping soundly when I evaluated her.  She just received Ativan as per the nurse.  Objective:   Vitals:   05/09/18 0818 05/09/18 0900 05/09/18 1000 05/09/18 1200  BP:  140/60 (!) 146/101   Pulse: 89 91 90   Resp: _0 Temp:    98.8 F (37.1 C)  TempSrc:    Oral  SpO2: 97% 94% 95%   Weight:      Height:        Intake/Output Summary (Last 24 hours) at 05/09/2018 1411 Last data filed at 05/09/2018 1200 Gross per 24 hour  Intake 894.96 ml  Output 525 ml  Net 369.96 ml   Filed Weights   05/08/18 2035 05/09/18 0346  Weight: 47.6 kg 48.5 kg    Data Reviewed: I have personally reviewed following labs and imaging studies  CBC: Recent Labs  Lab 05/08/18 2105  WBC 7.5  HGB 14.6  HCT 45.2  MCV 90.8  PLT 536   Basic Metabolic  Panel: Recent Labs  Lab 05/08/18 2105 05/09/18 0557  NA 130* 133*  K 4.0 4.0  CL 91* 93*  CO2 30 31  GLUCOSE 139* 189*  BUN 20 14  CREATININE 0.40* 0.38*  CALCIUM 9.3 8.7*   GFR: Estimated Creatinine Clearance: 48.7 mL/min (A) (by C-G formula based on SCr of 0.38 mg/dL (L)). Liver Function Tests: No results for input(s): AST, ALT, ALKPHOS, BILITOT, PROT, ALBUMIN in the last 168 hours. No results for input(s): LIPASE, AMYLASE in the last 168 hours. No  results for input(s): AMMONIA in the last 168 hours. Coagulation Profile: No results for input(s): INR, PROTIME in the last 168 hours. Cardiac Enzymes: No results for input(s): CKTOTAL, CKMB, CKMBINDEX, TROPONINI in the last 168 hours. BNP (last 3 results) No results for input(s): PROBNP in the last 8760 hours. HbA1C: No results for input(s): HGBA1C in the last 72 hours. CBG: Recent Labs  Lab 05/09/18 0740  GLUCAP 177*   Lipid Profile: No results for input(s): CHOL, HDL, LDLCALC, TRIG, CHOLHDL, LDLDIRECT in the last 72 hours. Thyroid Function Tests: No results for input(s): TSH, T4TOTAL, FREET4, T3FREE, THYROIDAB in the last 72 hours. Anemia Panel: No results for input(s): VITAMINB12, FOLATE, FERRITIN, TIBC, IRON, RETICCTPCT in the last 72 hours. Urine analysis:    Component Value Date/Time   COLORURINE STRAW (A) 05/09/2018 0646   APPEARANCEUR CLEAR 05/09/2018 0646   LABSPEC 1.005 05/09/2018 0646   PHURINE 7.0 05/09/2018 0646   GLUCOSEU 150 (A) 05/09/2018 0646   HGBUR NEGATIVE 05/09/2018 0646   BILIRUBINUR NEGATIVE 05/09/2018 0646   KETONESUR 5 (A) 05/09/2018 0646   PROTEINUR NEGATIVE 05/09/2018 0646   NITRITE NEGATIVE 05/09/2018 0646   LEUKOCYTESUR NEGATIVE 05/09/2018 0646   Sepsis Labs: _0 (procalcitonin:4,lacticidven:4)  ) Recent Results (from the past 240 hour(s))  MRSA PCR Screening     Status: None   Collection Time: 05/09/18  3:41 AM  Result Value Ref Range Status   MRSA by PCR NEGATIVE NEGATIVE Final    Comment:        The GeneXpert MRSA Assay (FDA approved for NASAL specimens only), is one component of a comprehensive MRSA colonization surveillance program. It is not intended to diagnose MRSA infection nor to guide or monitor treatment for MRSA infections. Performed at [facility], [place] [address], [place],  [address]       Radiology Studies: Dg Chest Port 1 View  Result Date: 05/08/2018 CLINICAL DATA:   Worsening shortness of breath and cough. Previous smoker. History of hypertension. EXAM: PORTABLE CHEST 1 VIEW COMPARISON:  02/03/2018 FINDINGS: Cardiac enlargement. No vascular congestion, edema, or consolidation. Diffuse emphysematous changes in the lungs. Scattered fibrosis. No blunting of costophrenic angles. No pneumothorax. Mediastinal contours appear intact. IMPRESSION: Cardiac enlargement. Emphysematous changes and fibrosis in the lungs. No evidence of active pulmonary disease. Electronically Signed   By: Lucienne Capers M.D.   On: 05/08/2018 21:59     Scheduled Meds: . albuterol  2.5 mg Nebulization TID  . buPROPion  150 mg Oral Daily  . cholecalciferol  1,000 Units Oral Daily  . diltiazem  360 mg Oral Daily  . enoxaparin (LOVENOX) injection  40 mg Subcutaneous Q24H  . folic acid  1 mg Oral Daily  . LORazepam  0-4 mg Intravenous Q6H   Followed by  . [START ON 05/11/2018] LORazepam  0-4 mg Intravenous Q12H  . losartan  100 mg Oral Daily  . mouth rinse  15 mL Mouth Rinse BID  . methylPREDNISolone (SOLU-MEDROL) injection  60 mg Intravenous Q6H  . mometasone-formoterol  2 puff Inhalation BID  . multivitamin with minerals  1 tablet Oral Daily  . sodium chloride flush  3 mL Intravenous Q12H  . thiamine  100 mg Oral Daily   Or  . thiamine  100 mg Intravenous Daily  . umeclidinium bromide  1 puff Inhalation Daily   Continuous Infusions: . levofloxacin (LEVAQUIN) IV       LOS: 0 days   Time Spent in minutes   30 minutes  Dorsie Sethi D.O. on 05/09/2018 at 2:11 PM  Between 7am to 7pm - Please see pager noted on [email or web address]  After 7pm go to [email or web address]  And look for the night coverage person covering for me after hours  [facility] Office  [phone]

## 2018-05-09 NOTE — ED Notes (Signed)
ED TO INPATIENT HANDOFF REPORT  Name/Age/Gender [name] 72 y.o. female  Code Status    Code Status Orders  (From admission, onward)         Start     Ordered   05/09/18 0209  Full code  Continuous     05/09/18 0211        Code Status History    Date Active Date Inactive Code Status Order ID Comments User Context   03/02/2018 1028 03/03/2018 1351 Full Code [id number]  Cheri Fowler, MD Inpatient   08/19/2012 2119 08/21/2012 1728 Full Code [id number]  Samuella Cota, MD Inpatient      Home/SNF/Other Home  Chief Complaint Shortness of Breath   Level of Care/Admitting Diagnosis ED Disposition    ED Disposition Condition [place]: [facility] [100102]  Level of Care: Stepdown [14]  Admit to SDU based on following criteria: Respiratory Distress:  Frequent assessment and/or intervention to maintain adequate ventilation/respiration, pulmonary toilet, and respiratory treatment.  Diagnosis: COPD with acute exacerbation [facility] [[id number]]  Admitting Physician: Vianne Bulls [[id number]]  Attending Physician: Vianne Bulls [[id number]]  PT Class (Do Not Modify): Observation [104]  PT Acc Code (Do Not Modify): Observation [10022]       Medical History Past Medical History:  Diagnosis Date  . Abrasion    under left knee x 2 places changing dressing q day of bid, applying ssd cream area healing due to fell 2 weeks ago  . Blood dyscrasia    BLEEDS EASY  . Bruises easily    bleeds easily  . Closed patellar sleeve fracture of left knee 01/04/2018  . Closed patellar sleeve fracture of right knee 01/04/2018   healing   . COPD (chronic obstructive pulmonary disease) ([place])   . Coronary artery disease    per dr Philis Kendall note 01-01-18 note  . Hernia, inguinal    LEFT  . Hypertension    states under control with meds., has been on med. > 10 yr.  . On home oxygen therapy    at night 2L/min  . Pneumonia    "SEVERAL TIMES IN PAST,  LAST TIME 2018"  . Pulmonary hypertension ([place])    per dr Terrence Dupont 7-19 19 lov note  . Shortness of breath dyspnea    with exertion  . SVD (spontaneous vaginal delivery)    x 1  . Thin skin   . Urinary tract infection 2019  . Wears partial dentures    lower partial and upper plate    Allergies Allergies  Allergen Reactions  . Tramadol Other (See Comments)    GI UPSET    IV Location/Drains/Wounds Patient Lines/Drains/Airways Status   Active Line/Drains/Airways    Name:   Placement date:   Placement time:   Site:   Days:   Peripheral IV 05/08/18 Left Antecubital   05/08/18    2244    Antecubital   1   AIRWAYS   03/02/18    0655     68   Incision (Closed) 03/02/18 Perineum   03/02/18    0825     72          Labs/Imaging Results for orders placed or performed during the hospital encounter of 05/08/18 (from the past 48 hour(s))  Basic metabolic panel     Status: Abnormal   Collection Time: 05/08/18  9:05 PM  Result Value Ref Range   Sodium 130 (L) 135 - 145 mmol/L  Potassium 4.0 3.5 - 5.1 mmol/L   Chloride 91 (L) 98 - 111 mmol/L   CO2 30 22 - 32 mmol/L   Glucose, Bld 139 (H) 70 - 99 mg/dL   BUN 20 8 - 23 mg/dL   Creatinine, Ser 0.40 (L) 0.44 - 1.00 mg/dL   Calcium 9.3 8.9 - 10.3 mg/dL   GFR calc non Af Amer >60 >60 mL/min   GFR calc Af Amer >60 >60 mL/min    Comment: (NOTE) The eGFR has been calculated using the CKD EPI equation. This calculation has not been validated in all clinical situations. eGFR's persistently <60 mL/min signify possible Chronic Kidney Disease.    Anion gap 9 5 - 15    Comment: Performed at [facility], [place] [address], [place], [place] [address]  CBC     Status: None   Collection Time: 05/08/18  9:05 PM  Result Value Ref Range   WBC 7.5 4.0 - 10.5 K/uL   RBC 4.98 3.87 - 5.11 MIL/uL   Hemoglobin 14.6 12.0 - 15.0 g/dL   HCT 45.2 36.0 - 46.0 %   MCV 90.8 80.0 - 100.0 fL   MCH 29.3 26.0 - 34.0 pg   MCHC 32.3 30.0 - 36.0  g/dL   RDW 13.7 11.5 - 15.5 %   Platelets 240 150 - 400 K/uL   nRBC 0.0 0.0 - 0.2 %    Comment: Performed at [facility], [place] [address], [place], [place] [address]  Blood gas, arterial ([facility] & [facility] ONLY)     Status: Abnormal   Collection Time: 05/09/18 12:55 AM  Result Value Ref Range   O2 Content 2.0 L/min   Delivery systems NASAL CANNULA    pH, Arterial 7.421 7.350 - 7.450   pCO2 arterial 48.8 (H) 32.0 - 48.0 mmHg   pO2, Arterial 127 (H) 83.0 - 108.0 mmHg   Bicarbonate 31.2 (H) 20.0 - 28.0 mmol/L   Acid-Base Excess 6.0 (H) 0.0 - 2.0 mmol/L   O2 Saturation 98.4 %   Patient temperature 97.9    Collection site RIGHT RADIAL    Drawn by [id number]    Sample type ARTERIAL    Allens test (pass/fail) PASS PASS    Comment: Performed at [facility], [place] [address] [place], [place] [address]   Dg Chest Port 1 View  Result Date: 05/08/2018 CLINICAL DATA:  Worsening shortness of breath and cough. Previous smoker. History of hypertension. EXAM: PORTABLE CHEST 1 VIEW COMPARISON:  02/03/2018 FINDINGS: Cardiac enlargement. No vascular congestion, edema, or consolidation. Diffuse emphysematous changes in the lungs. Scattered fibrosis. No blunting of costophrenic angles. No pneumothorax. Mediastinal contours appear intact. IMPRESSION: Cardiac enlargement. Emphysematous changes and fibrosis in the lungs. No evidence of active pulmonary disease. Electronically Signed   By: Lucienne Capers M.D.   On: 05/08/2018 21:59    Pending Labs Unresulted Labs (From admission, onward)    Start     Ordered   05/16/18 0500  Creatinine, serum  (enoxaparin (LOVENOX)    CrCl >/= 30 ml/min)  Weekly,   R    Comments:  while on enoxaparin therapy    05/09/18 0211   05/09/18 1025  Basic metabolic panel  Tomorrow morning,   R     05/09/18 0211   05/09/18 0208  Culture, sputum-assessment  Once,   R     05/09/18 0211   05/09/18 0208  Gram stain  Once,   R     05/09/18 0211  Vitals/Pain Today's Vitals   05/09/18 0200 05/09/18 0206 05/09/18 0230 05/09/18 0300  BP: (!) 159/83  (!) 156/90 (!) 178/75  Pulse: 91  87 95  Resp: (!) 22  (!) 24 (!) 28  Temp:      TempSrc:      SpO2: 95% 95% 99% 97%  Weight:      Height:      PainSc:        Isolation Precautions No active isolations  Medications Medications  0.9 %  sodium chloride infusion ( Intravenous New Bag/Given 05/09/18 0224)  HYDROcodone-acetaminophen (NORCO/VICODIN) 5-325 MG per tablet 1 tablet (has no administration in time range)  diltiazem (CARDIZEM CD) 24 hr capsule 360 mg (has no administration in time range)  losartan (COZAAR) tablet 100 mg (has no administration in time range)  buPROPion (WELLBUTRIN SR) 12 hr tablet 150 mg (has no administration in time range)  cholecalciferol (VITAMIN D) tablet 1,000 Units (has no administration in time range)  albuterol (PROVENTIL) (2.5 MG/3ML) 0.083% nebulizer solution 2.5 mg (has no administration in time range)  mometasone-formoterol (DULERA) 200-5 MCG/ACT inhaler 2 puff (has no administration in time range)  Tiotropium Bromide Monohydrate AERS 2 puff (has no administration in time range)  enoxaparin (LOVENOX) injection 40 mg (has no administration in time range)  sodium chloride flush (NS) 0.9 % injection 3 mL (has no administration in time range)  acetaminophen (TYLENOL) tablet 650 mg (has no administration in time range)    Or  acetaminophen (TYLENOL) suppository 650 mg (has no administration in time range)  senna-docusate (Senokot-S) tablet 1 tablet (has no administration in time range)  ondansetron (ZOFRAN) tablet 4 mg (has no administration in time range)    Or  ondansetron (ZOFRAN) injection 4 mg (has no administration in time range)  levofloxacin (LEVAQUIN) IVPB 500 mg (has no administration in time range)  methylPREDNISolone sodium succinate (SOLU-MEDROL) 125 mg/2 mL injection 60 mg (has no administration in time range)  methylPREDNISolone  sodium succinate (SOLU-MEDROL) 125 mg/2 mL injection 125 mg (has no administration in time range)  albuterol (PROVENTIL) (2.5 MG/3ML) 0.083% nebulizer solution 5 mg (5 mg Nebulization Given 05/08/18 2130)  predniSONE (DELTASONE) tablet 60 mg (60 mg Oral Given 05/08/18 2151)  albuterol (PROVENTIL,VENTOLIN) solution continuous neb (10 mg/hr Nebulization Given 05/08/18 2238)  levofloxacin (LEVAQUIN) IVPB 500 mg ( Intravenous Stopped 05/08/18 2343)  albuterol (PROVENTIL,VENTOLIN) solution continuous neb (15 mg/hr Nebulization Given 05/09/18 0206)    Mobility walks with person assist

## 2018-05-09 NOTE — ED Notes (Signed)
Sonji (for pt info) - [phone]

## 2018-05-09 NOTE — Progress Notes (Signed)
Pt states she feels like she cannot pee, in obvious discomfort. Bladder scan reading >999. MD notified. Order for in and out catheterization and urinalysis obtained. 650 cc of clear, yellow, odor-free urine returned. Urinalysis sent to lab. purewick placed. Pt resting comfortably in bed with eyes closed at this time will continue to monitor

## 2018-05-10 DIAGNOSIS — I10 Essential (primary) hypertension: Secondary | ICD-10-CM | POA: Diagnosis not present

## 2018-05-10 DIAGNOSIS — R7303 Prediabetes: Secondary | ICD-10-CM | POA: Diagnosis not present

## 2018-05-10 DIAGNOSIS — Z841 Family history of disorders of kidney and ureter: Secondary | ICD-10-CM | POA: Diagnosis not present

## 2018-05-10 DIAGNOSIS — R339 Retention of urine, unspecified: Secondary | ICD-10-CM | POA: Diagnosis not present

## 2018-05-10 DIAGNOSIS — Z87891 Personal history of nicotine dependence: Secondary | ICD-10-CM | POA: Diagnosis not present

## 2018-05-10 DIAGNOSIS — E871 Hypo-osmolality and hyponatremia: Secondary | ICD-10-CM | POA: Diagnosis not present

## 2018-05-10 DIAGNOSIS — Z8249 Family history of ischemic heart disease and other diseases of the circulatory system: Secondary | ICD-10-CM | POA: Diagnosis not present

## 2018-05-10 DIAGNOSIS — I272 Pulmonary hypertension, unspecified: Secondary | ICD-10-CM | POA: Diagnosis present

## 2018-05-10 DIAGNOSIS — Z9981 Dependence on supplemental oxygen: Secondary | ICD-10-CM | POA: Diagnosis not present

## 2018-05-10 DIAGNOSIS — J9611 Chronic respiratory failure with hypoxia: Secondary | ICD-10-CM | POA: Diagnosis not present

## 2018-05-10 DIAGNOSIS — D72829 Elevated white blood cell count, unspecified: Secondary | ICD-10-CM | POA: Diagnosis not present

## 2018-05-10 DIAGNOSIS — T380X5A Adverse effect of glucocorticoids and synthetic analogues, initial encounter: Secondary | ICD-10-CM | POA: Diagnosis not present

## 2018-05-10 DIAGNOSIS — R739 Hyperglycemia, unspecified: Secondary | ICD-10-CM | POA: Diagnosis not present

## 2018-05-10 DIAGNOSIS — R338 Other retention of urine: Secondary | ICD-10-CM | POA: Diagnosis not present

## 2018-05-10 DIAGNOSIS — Z7951 Long term (current) use of inhaled steroids: Secondary | ICD-10-CM | POA: Diagnosis not present

## 2018-05-10 DIAGNOSIS — I2510 Atherosclerotic heart disease of native coronary artery without angina pectoris: Secondary | ICD-10-CM | POA: Diagnosis present

## 2018-05-10 DIAGNOSIS — Z885 Allergy status to narcotic agent status: Secondary | ICD-10-CM | POA: Diagnosis not present

## 2018-05-10 DIAGNOSIS — J9621 Acute and chronic respiratory failure with hypoxia: Secondary | ICD-10-CM | POA: Diagnosis present

## 2018-05-10 DIAGNOSIS — J441 Chronic obstructive pulmonary disease with (acute) exacerbation: Secondary | ICD-10-CM | POA: Diagnosis not present

## 2018-05-10 DIAGNOSIS — R0602 Shortness of breath: Secondary | ICD-10-CM | POA: Diagnosis present

## 2018-05-10 LAB — CBC
HEMATOCRIT: 42 % (ref 36.0–46.0)
HEMOGLOBIN: 13.3 g/dL (ref 12.0–15.0)
MCH: 29.1 pg (ref 26.0–34.0)
MCHC: 31.7 g/dL (ref 30.0–36.0)
MCV: 91.9 fL (ref 80.0–100.0)
Platelets: 228 10*3/uL (ref 150–400)
RBC: 4.57 MIL/uL (ref 3.87–5.11)
RDW: 14 % (ref 11.5–15.5)
WBC: 10.2 10*3/uL (ref 4.0–10.5)
nRBC: 0 % (ref 0.0–0.2)

## 2018-05-10 LAB — BASIC METABOLIC PANEL
Anion gap: 7 (ref 5–15)
BUN: 16 mg/dL (ref 8–23)
CHLORIDE: 97 mmol/L — AB (ref 98–111)
CO2: 32 mmol/L (ref 22–32)
CREATININE: 0.37 mg/dL — AB (ref 0.44–1.00)
Calcium: 8.5 mg/dL — ABNORMAL LOW (ref 8.9–10.3)
GFR calc non Af Amer: >60 mL/min (ref >60)
GLUCOSE: 179 mg/dL — AB (ref 70–99)
Potassium: 3.5 mmol/L (ref 3.5–5.1)
Sodium: 136 mmol/L (ref 135–145)

## 2018-05-10 LAB — GLUCOSE, CAPILLARY: Glucose-Capillary: 197 mg/dL — ABNORMAL HIGH (ref 70–99)

## 2018-05-10 MED ORDER — METHYLPREDNISOLONE SODIUM SUCC 125 MG IJ SOLR
60.0000 mg | Freq: Two times a day (BID) | INTRAMUSCULAR | Status: DC
  Administered 2018-05-10 – 2018-05-12 (×4): 60 mg via INTRAVENOUS
  Filled 2018-05-10 (×4): qty 2

## 2018-05-10 MED ORDER — IPRATROPIUM-ALBUTEROL 0.5-2.5 (3) MG/3ML IN SOLN
3.0000 mL | RESPIRATORY_TRACT | Status: DC
  Administered 2018-05-10 – 2018-05-11 (×5): 3 mL via RESPIRATORY_TRACT
  Filled 2018-05-10 (×6): qty 3

## 2018-05-10 MED ORDER — HYDROCODONE-HOMATROPINE 5-1.5 MG/5ML PO SYRP
5.0000 mL | ORAL_SOLUTION | Freq: Four times a day (QID) | ORAL | Status: DC | PRN
  Administered 2018-05-10 – 2018-05-15 (×6): 5 mL via ORAL
  Filled 2018-05-10 (×7): qty 5

## 2018-05-10 NOTE — Care Management Note (Signed)
Case Management Note  Patient Details  Name: [name] MRN: [id number] Date of Birth: May 09, 1946  Subjective/Objective:                  72 y.o.femalewith medical history significant forCOPD with chronic hypoxic respiratory failure, hypertension, and chronic bilateral leg swelling and ulcerations, now presenting to the emergency department for evaluation of shortness of breath and productive cough. Patient was seen in her pulmonology clinic on 05/03/2018 with productive cough and increased dyspnea, was diagnosed with acute exacerbation in COPD and started on prednisone and azithromycin. She failed to improve, was seen back on 05/06/2018 and reports that her prednisone course was extended. Unfortunately, she still has not had any significant improvement and may actually be worsening. She has been dyspneic at rest for the past couple days and has had difficulty sleeping due to this. She denies fevers, chills, or chest pain. Reports that her chronic bilateral lower extremity swelling is not as bad as usual.  Action/Plan: Lives alone Following for progression of care. Following for cm needs, none present at this time, no discharge plans at this time. Expected Discharge Date:                  Expected Discharge Plan:  Home/Self Care  In-House Referral:     Discharge planning Services  CM Consult  Post Acute Care Choice:    Choice offered to:     DME Arranged:    DME Agency:     HH Arranged:    HH Agency:     Status of Service:  In process, will continue to follow  If discussed at Long Length of Stay Meetings, dates discussed:    Additional Comments:  Leeroy Cha, RN 05/10/2018, 10:20 AM

## 2018-05-10 NOTE — Progress Notes (Signed)
PROGRESS NOTE    [name]  DXI:[id number] DOB: April 16, 1946 DOA: 05/08/2018 PCP: Christain Sacramento, MD   Brief Narrative:  HPI on 05/09/2018 by Dr. Mitzi Hansen [name] is a 72 y.o. female with medical history significant for COPD with chronic hypoxic respiratory failure, hypertension, and chronic bilateral leg swelling and ulcerations, now presenting to the emergency department for evaluation of shortness of breath and productive cough.  Patient was seen in her pulmonology clinic on 05/03/2018 with productive cough and increased dyspnea, was diagnosed with acute exacerbation in COPD and started on prednisone and azithromycin.  She failed to improve, was seen back on 05/06/2018 and reports that her prednisone course was extended.  Unfortunately, she still has not had any significant improvement and may actually be worsening.  She has been dyspneic at rest for the past couple days and has had difficulty sleeping due to this.  She denies fevers, chills, or chest pain.  Reports that her chronic bilateral lower extremity swelling is not as bad as usual.  Interim history Admitted for COPD exacerbation and respiratory failure. Improving very slowly.   Assessment & Plan   Acute on chronic hypoxic respiratory failure with COPD Exacerbation -Presented with worsening shortness of breath, cough despite being treated with prednisone and azithromycin since 05/03/2018 -Afebrile on admission -Chest x-ray reviewed, unremarkable -Patient did require BiPAP when admitted -Was treated with albuterol nebulizers x3, prednisone as well as Levaquin in the ED -Pending sputum culture if able -Continue nebulizer treatments, Solu-Medrol, Levaquin -Transitioned to nasal cannula, 2L  Hyponatremia -Sodium 130 on admission, up to 136 today -Has chronic leg edema -will discontinue IVF today  Essential hypertension -Continue diltiazem, losartan  Alcohol use -Regarding this and she recently received  Ativan -Continue CIWA protocol  DVT Prophylaxis  lovenox  Code Status: Full  Family Communication: None at bedside  Disposition Plan: Currently in observation.  Feel patient meets inpatient status, as patient is very slow to improve. Noted to have increased WOB with minimal exertion and drop in oxygen saturations. Continue current therapy. Suspect home when medically stable  Consultants None  Procedures  None  Antibiotics   Anti-infectives (From admission, onward)   Start     Dose/Rate Route Frequency Ordered Stop   05/09/18 2200  levofloxacin (LEVAQUIN) IVPB 500 mg     500 mg 100 mL/hr over 60 Minutes Intravenous Every 24 hours 05/09/18 0211 05/14/18 2159   05/08/18 2215  levofloxacin (LEVAQUIN) IVPB 500 mg     500 mg 100 mL/hr over 60 Minutes Intravenous  Once 05/08/18 2214 05/08/18 2343      Subjective:   [name] seen and examined today.  Patient feels her breathing has mildly improved, but not back to her baseline. Becomes very short of breath with minimal exertion. Denies current chest pain, abdominal pain, N/V/D/C, dizziness, headache.   Objective:   Vitals:   05/10/18 0847 05/10/18 0900 05/10/18 1153 05/10/18 1211  BP:    (!) 157/78  Pulse:  88  91  Resp:  (!) 22  20  Temp: 98.6 F (37 C)  98.6 F (37 C)   TempSrc: Oral  Oral   SpO2: 94% 96%  97%  Weight:      Height:        Intake/Output Summary (Last 24 hours) at 05/10/2018 1217 Last data filed at 05/10/2018 0900 Gross per 24 hour  Intake 268.88 ml  Output 1400 ml  Net -1131.12 ml   Filed Weights   05/08/18 2035  05/09/18 0346  Weight: 47.6 kg 48.5 kg   Exam  General: Well developed, well nourished, NAD, appears stated age  63: NCAT, mucous membranes moist.   Neck: Supple  Cardiovascular: S1 S2 auscultated, RRR  Respiratory: Diffuse expiratory wheezing, accessory muscle use  Abdomen: Soft, nontender, nondistended, + bowel sounds  Extremities: warm dry without cyanosis clubbing  or edema  Neuro: AAOx3, nonfocal  Psych: Appropriate mood and affect   Data Reviewed: I have personally reviewed following labs and imaging studies  CBC: Recent Labs  Lab 05/08/18 2105 05/10/18 0348  WBC 7.5 10.2  HGB 14.6 13.3  HCT 45.2 42.0  MCV 90.8 91.9  PLT 240 161   Basic Metabolic Panel: Recent Labs  Lab 05/08/18 2105 05/09/18 0557 05/10/18 0348  NA 130* 133* 136  K 4.0 4.0 3.5  CL 91* 93* 97*  CO2 30 31 32  GLUCOSE 139* 189* 179*  BUN _0 CREATININE 0.40* 0.38* 0.37*  CALCIUM 9.3 8.7* 8.5*   GFR: Estimated Creatinine Clearance: 48.7 mL/min (A) (by C-G formula based on SCr of 0.37 mg/dL (L)). Liver Function Tests: No results for input(s): AST, ALT, ALKPHOS, BILITOT, PROT, ALBUMIN in the last 168 hours. No results for input(s): LIPASE, AMYLASE in the last 168 hours. No results for input(s): AMMONIA in the last 168 hours. Coagulation Profile: No results for input(s): INR, PROTIME in the last 168 hours. Cardiac Enzymes: No results for input(s): CKTOTAL, CKMB, CKMBINDEX, TROPONINI in the last 168 hours. BNP (last 3 results) No results for input(s): PROBNP in the last 8760 hours. HbA1C: No results for input(s): HGBA1C in the last 72 hours. CBG: Recent Labs  Lab 05/09/18 0740 05/10/18 0833  GLUCAP 177* 197*   Lipid Profile: No results for input(s): CHOL, HDL, LDLCALC, TRIG, CHOLHDL, LDLDIRECT in the last 72 hours. Thyroid Function Tests: No results for input(s): TSH, T4TOTAL, FREET4, T3FREE, THYROIDAB in the last 72 hours. Anemia Panel: No results for input(s): VITAMINB12, FOLATE, FERRITIN, TIBC, IRON, RETICCTPCT in the last 72 hours. Urine analysis:    Component Value Date/Time   COLORURINE STRAW (A) 05/09/2018 0646   APPEARANCEUR CLEAR 05/09/2018 0646   LABSPEC 1.005 05/09/2018 0646   PHURINE 7.0 05/09/2018 0646   GLUCOSEU 150 (A) 05/09/2018 0646   HGBUR NEGATIVE 05/09/2018 0646   BILIRUBINUR NEGATIVE 05/09/2018 0646   KETONESUR 5 (A)  05/09/2018 0646   PROTEINUR NEGATIVE 05/09/2018 0646   NITRITE NEGATIVE 05/09/2018 0646   LEUKOCYTESUR NEGATIVE 05/09/2018 0646   Sepsis Labs: _1 (procalcitonin:4,lacticidven:4)  ) Recent Results (from the past 240 hour(s))  MRSA PCR Screening     Status: None   Collection Time: 05/09/18  3:41 AM  Result Value Ref Range Status   MRSA by PCR NEGATIVE NEGATIVE Final    Comment:        The GeneXpert MRSA Assay (FDA approved for NASAL specimens only), is one component of a comprehensive MRSA colonization surveillance program. It is not intended to diagnose MRSA infection nor to guide or monitor treatment for MRSA infections. Performed at [facility], [place] [address] [place],  [address]       Radiology Studies: Dg Chest Port 1 View  Result Date: 05/08/2018 CLINICAL DATA:  Worsening shortness of breath and cough. Previous smoker. History of hypertension. EXAM: PORTABLE CHEST 1 VIEW COMPARISON:  02/03/2018 FINDINGS: Cardiac enlargement. No vascular congestion, edema, or consolidation. Diffuse emphysematous changes in the lungs. Scattered fibrosis. No blunting of costophrenic angles. No pneumothorax. Mediastinal contours appear intact.  IMPRESSION: Cardiac enlargement. Emphysematous changes and fibrosis in the lungs. No evidence of active pulmonary disease. Electronically Signed   By: Lucienne Capers M.D.   On: 05/08/2018 21:59     Scheduled Meds: . albuterol  2.5 mg Nebulization TID  . buPROPion  150 mg Oral Daily  . cholecalciferol  1,000 Units Oral Daily  . diltiazem  360 mg Oral Daily  . enoxaparin (LOVENOX) injection  40 mg Subcutaneous Q24H  . folic acid  1 mg Oral Daily  . ipratropium-albuterol  3 mL Nebulization Q4H  . LORazepam  0-4 mg Intravenous Q6H   Followed by  . [START ON 05/11/2018] LORazepam  0-4 mg Intravenous Q12H  . losartan  100 mg Oral Daily  . mouth rinse  15 mL Mouth Rinse BID  . methylPREDNISolone (SOLU-MEDROL)  injection  60 mg Intravenous Q12H  . mometasone-formoterol  2 puff Inhalation BID  . multivitamin with minerals  1 tablet Oral Daily  . sodium chloride flush  3 mL Intravenous Q12H  . thiamine  100 mg Oral Daily   Or  . thiamine  100 mg Intravenous Daily  . umeclidinium bromide  1 puff Inhalation Daily   Continuous Infusions: . levofloxacin (LEVAQUIN) IV Stopped (05/09/18 2223)     LOS: 0 days   Time Spent in minutes   30 minutes  Jac Romulus D.O. on 05/10/2018 at 12:17 PM  Between 7am to 7pm - Please see pager noted on [email or web address]  After 7pm go to [email or web address]  And look for the night coverage person covering for me after hours  [facility] Office  [phone]

## 2018-05-11 ENCOUNTER — Inpatient Hospital Stay (HOSPITAL_COMMUNITY): Payer: Medicare Other

## 2018-05-11 DIAGNOSIS — R338 Other retention of urine: Secondary | ICD-10-CM

## 2018-05-11 LAB — GLUCOSE, CAPILLARY: GLUCOSE-CAPILLARY: 209 mg/dL — AB (ref 70–99)

## 2018-05-11 MED ORDER — GUAIFENESIN-DM 100-10 MG/5ML PO SYRP: 5.0000 mL | ORAL_SOLUTION | ORAL | Status: DC | PRN

## 2018-05-11 MED ORDER — IPRATROPIUM-ALBUTEROL 0.5-2.5 (3) MG/3ML IN SOLN
3.0000 mL | Freq: Four times a day (QID) | RESPIRATORY_TRACT | Status: DC
  Administered 2018-05-11 – 2018-05-14 (×11): 3 mL via RESPIRATORY_TRACT
  Filled 2018-05-11 (×11): qty 3

## 2018-05-11 MED ORDER — BENZONATATE 100 MG PO CAPS
200.0000 mg | ORAL_CAPSULE | Freq: Three times a day (TID) | ORAL | Status: DC
  Administered 2018-05-11 – 2018-05-20 (×28): 200 mg via ORAL
  Filled 2018-05-11 (×28): qty 2

## 2018-05-11 NOTE — Progress Notes (Signed)
PROGRESS NOTE    Crystal GORDEN  [name]:[id number] DOB: 08/12/1945 DOA: 05/08/2018 PCP: Christain Sacramento, MD   Brief Narrative:  HPI on 05/09/2018 by Dr. Mitzi Hansen [name] is a 72 y.o. female with medical history significant for COPD with chronic hypoxic respiratory failure, hypertension, and chronic bilateral leg swelling and ulcerations, now presenting to the emergency department for evaluation of shortness of breath and productive cough.  Patient was seen in her pulmonology clinic on 05/03/2018 with productive cough and increased dyspnea, was diagnosed with acute exacerbation in COPD and started on prednisone and azithromycin.  She failed to improve, was seen back on 05/06/2018 and reports that her prednisone course was extended.  Unfortunately, she still has not had any significant improvement and may actually be worsening.  She has been dyspneic at rest for the past couple days and has had difficulty sleeping due to this.  She denies fevers, chills, or chest pain.  Reports that her chronic bilateral lower extremity swelling is not as bad as usual.  Interim history Admitted for COPD exacerbation and respiratory failure. Improving very slowly.   Assessment & Plan   Acute on chronic hypoxic respiratory failure with COPD Exacerbation -Presented with worsening shortness of breath, cough despite being treated with prednisone and azithromycin since 05/03/2018 -Afebrile on admission -Chest x-ray reviewed, unremarkable -Patient did require BiPAP when admitted -Was treated with albuterol nebulizers x3, prednisone as well as Levaquin in the ED -Pending sputum culture if able -Continue nebulizer treatments, Solu-Medrol, Levaquin -Transitioned to nasal cannula, 2L -Continues to have cough- hyocodan, tessalon, robituss/dextromethorphan added  Hyponatremia -Sodium 130 on admission, up to 136  -IVF discontinued   Essential hypertension -Continue diltiazem, losartan  Alcohol  use -Regarding this and she recently received Ativan -Continue CIWA protocol  DVT Prophylaxis  lovenox  Code Status: Full  Family Communication: None at bedside  Disposition Plan: Admitted. Patient continues to have coughing spells making her extremely short of breath. She is slow to progress. Will add more meds for cough and continue to monitor respiratory status. Suspect home when stable and improved.  Consultants None  Procedures  None  Antibiotics   Anti-infectives (From admission, onward)   Start     Dose/Rate Route Frequency Ordered Stop   05/09/18 2200  levofloxacin (LEVAQUIN) IVPB 500 mg     500 mg 100 mL/hr over 60 Minutes Intravenous Every 24 hours 05/09/18 0211 05/14/18 2159   05/08/18 2215  levofloxacin (LEVAQUIN) IVPB 500 mg     500 mg 100 mL/hr over 60 Minutes Intravenous  Once 05/08/18 2214 05/08/18 2343      Subjective:   Fahima Cifelli seen and examined today.  Feels breathing is about the same. States she has been having coughing spells and has been getting more short of breath with them. Also having shortness of breath with minimal exertion. Denies current chest pain, shortness of breath, abdominal pain, N/V/D/C, dizziness, headache.   Objective:   Vitals:   05/11/18 0022 05/11/18 0400 05/11/18 0800 05/11/18 0819  BP: (!) 162/69 (!) 160/75 (!) 177/94   Pulse: 99 93 91   Resp: _0 Temp:   98 F (36.7 C)   TempSrc:   Oral   SpO2: 97% 96% 98% 95%  Weight:      Height:        Intake/Output Summary (Last 24 hours) at 05/11/2018 1049 Last data filed at 05/11/2018 0800 Gross per 24 hour  Intake 631.09 ml  Output 200  ml  Net 431.09 ml   Filed Weights   05/08/18 2035 05/09/18 0346  Weight: 47.6 kg 48.5 kg   Exam  General: Well developed, chronically ill appearing, NAD  HEENT: NCAT, mucous membranes moist.   Neck: Supple  Cardiovascular: S1 S2 auscultated,  RRR  Respiratory: Diffuse expiratory wheezing, accessory muscle use (mildy  improved)  Abdomen: Soft, nontender, nondistended, + bowel sounds  Extremities: warm dry without cyanosis clubbing or edema  Neuro: AAOx3, RRR  Psych: Normal affect and demeanor, pleasant   Data Reviewed: I have personally reviewed following labs and imaging studies  CBC: Recent Labs  Lab 05/08/18 2105 05/10/18 0348  WBC 7.5 10.2  HGB 14.6 13.3  HCT 45.2 42.0  MCV 90.8 91.9  PLT 240 552   Basic Metabolic Panel: Recent Labs  Lab 05/08/18 2105 05/09/18 0557 05/10/18 0348  NA 130* 133* 136  K 4.0 4.0 3.5  CL 91* 93* 97*  CO2 30 31 32  GLUCOSE 139* 189* 179*  BUN _0 CREATININE 0.40* 0.38* 0.37*  CALCIUM 9.3 8.7* 8.5*   GFR: Estimated Creatinine Clearance: 48.7 mL/min (A) (by C-G formula based on SCr of 0.37 mg/dL (L)). Liver Function Tests: No results for input(s): AST, ALT, ALKPHOS, BILITOT, PROT, ALBUMIN in the last 168 hours. No results for input(s): LIPASE, AMYLASE in the last 168 hours. No results for input(s): AMMONIA in the last 168 hours. Coagulation Profile: No results for input(s): INR, PROTIME in the last 168 hours. Cardiac Enzymes: No results for input(s): CKTOTAL, CKMB, CKMBINDEX, TROPONINI in the last 168 hours. BNP (last 3 results) No results for input(s): PROBNP in the last 8760 hours. HbA1C: No results for input(s): HGBA1C in the last 72 hours. CBG: Recent Labs  Lab 05/09/18 0740 05/10/18 0833 05/11/18 0419  GLUCAP 177* 197* 209*   Lipid Profile: No results for input(s): CHOL, HDL, LDLCALC, TRIG, CHOLHDL, LDLDIRECT in the last 72 hours. Thyroid Function Tests: No results for input(s): TSH, T4TOTAL, FREET4, T3FREE, THYROIDAB in the last 72 hours. Anemia Panel: No results for input(s): VITAMINB12, FOLATE, FERRITIN, TIBC, IRON, RETICCTPCT in the last 72 hours. Urine analysis:    Component Value Date/Time   COLORURINE STRAW (A) 05/09/2018 0646   APPEARANCEUR CLEAR 05/09/2018 0646   LABSPEC 1.005 05/09/2018 0646   PHURINE 7.0  05/09/2018 0646   GLUCOSEU 150 (A) 05/09/2018 0646   HGBUR NEGATIVE 05/09/2018 0646   BILIRUBINUR NEGATIVE 05/09/2018 0646   KETONESUR 5 (A) 05/09/2018 0646   PROTEINUR NEGATIVE 05/09/2018 0646   NITRITE NEGATIVE 05/09/2018 0646   LEUKOCYTESUR NEGATIVE 05/09/2018 0646   Sepsis Labs: _1 (procalcitonin:4,lacticidven:4)  ) Recent Results (from the past 240 hour(s))  MRSA PCR Screening     Status: None   Collection Time: 05/09/18  3:41 AM  Result Value Ref Range Status   MRSA by PCR NEGATIVE NEGATIVE Final    Comment:        The GeneXpert MRSA Assay (FDA approved for NASAL specimens only), is one component of a comprehensive MRSA colonization surveillance program. It is not intended to diagnose MRSA infection nor to guide or monitor treatment for MRSA infections. Performed at [facility], [place] [address], [place], [place] [address]       Radiology Studies: Dg Chest Port 1 View  Result Date: 05/11/2018 CLINICAL DATA:  Shortness of breath EXAM: PORTABLE CHEST 1 VIEW COMPARISON:  05/08/2018 FINDINGS: Cardiac shadow is stable. Aortic calcifications are again seen. Lungs are hyperinflated bilaterally. Mild fibrotic changes are  again identified. No focal confluent infiltrate or sizable effusion is seen. IMPRESSION: COPD chronic fibrotic changes without acute abnormality. Electronically Signed   By: Inez Catalina M.D.   On: 05/11/2018 08:49     Scheduled Meds: . buPROPion  150 mg Oral Daily  . cholecalciferol  1,000 Units Oral Daily  . diltiazem  360 mg Oral Daily  . enoxaparin (LOVENOX) injection  40 mg Subcutaneous Q24H  . folic acid  1 mg Oral Daily  . ipratropium-albuterol  3 mL Nebulization Q6H  . LORazepam  0-4 mg Intravenous Q12H  . losartan  100 mg Oral Daily  . mouth rinse  15 mL Mouth Rinse BID  . methylPREDNISolone (SOLU-MEDROL) injection  60 mg Intravenous Q12H  . mometasone-formoterol  2 puff Inhalation BID  . multivitamin with minerals   1 tablet Oral Daily  . sodium chloride flush  3 mL Intravenous Q12H  . thiamine  100 mg Oral Daily   Or  . thiamine  100 mg Intravenous Daily  . umeclidinium bromide  1 puff Inhalation Daily   Continuous Infusions: . levofloxacin (LEVAQUIN) IV Stopped (05/10/18 2309)     LOS: 1 day   Time Spent in minutes   30 minutes  Almendra Loria D.O. on 05/11/2018 at 10:49 AM  Between 7am to 7pm - Please see pager noted on [email or web address]  After 7pm go to [email or web address]  And look for the night coverage person covering for me after hours  [facility] Office  [phone]

## 2018-05-12 LAB — COMPREHENSIVE METABOLIC PANEL
ALK PHOS: 36 U/L — AB (ref 38–126)
ALT: 24 U/L (ref 0–44)
AST: 16 U/L (ref 15–41)
Albumin: 3.2 g/dL — ABNORMAL LOW (ref 3.5–5.0)
Anion gap: 5 (ref 5–15)
BUN: 23 mg/dL (ref 8–23)
CALCIUM: 8.8 mg/dL — AB (ref 8.9–10.3)
CO2: 38 mmol/L — AB (ref 22–32)
CREATININE: 0.45 mg/dL (ref 0.44–1.00)
Chloride: 96 mmol/L — ABNORMAL LOW (ref 98–111)
GFR calc non Af Amer: >60 mL/min (ref >60)
Glucose, Bld: 154 mg/dL — ABNORMAL HIGH (ref 70–99)
Potassium: 4.2 mmol/L (ref 3.5–5.1)
SODIUM: 139 mmol/L (ref 135–145)
Total Bilirubin: 0.4 mg/dL (ref 0.3–1.2)
Total Protein: 5.6 g/dL — ABNORMAL LOW (ref 6.5–8.1)

## 2018-05-12 LAB — PHOSPHORUS: PHOSPHORUS: 2.9 mg/dL (ref 2.5–4.6)

## 2018-05-12 LAB — GLUCOSE, CAPILLARY: Glucose-Capillary: 155 mg/dL — ABNORMAL HIGH (ref 70–99)

## 2018-05-12 LAB — CBC WITH DIFFERENTIAL/PLATELET
ABS IMMATURE GRANULOCYTES: 0.16 10*3/uL — AB (ref 0.00–0.07)
Basophils Absolute: 0 10*3/uL (ref 0.0–0.1)
Basophils Relative: 0 %
Eosinophils Absolute: 0 10*3/uL (ref 0.0–0.5)
Eosinophils Relative: 0 %
HCT: 45.4 % (ref 36.0–46.0)
HEMOGLOBIN: 14.2 g/dL (ref 12.0–15.0)
Immature Granulocytes: 2 %
LYMPHS ABS: 0.3 10*3/uL — AB (ref 0.7–4.0)
LYMPHS PCT: 3 %
MCH: 28.9 pg (ref 26.0–34.0)
MCHC: 31.3 g/dL (ref 30.0–36.0)
MCV: 92.3 fL (ref 80.0–100.0)
MONO ABS: 0.7 10*3/uL (ref 0.1–1.0)
MONOS PCT: 6 %
Neutro Abs: 9.7 10*3/uL — ABNORMAL HIGH (ref 1.7–7.7)
Neutrophils Relative %: 89 %
Platelets: 245 10*3/uL (ref 150–400)
RBC: 4.92 MIL/uL (ref 3.87–5.11)
RDW: 13.9 % (ref 11.5–15.5)
WBC: 10.9 10*3/uL — ABNORMAL HIGH (ref 4.0–10.5)
nRBC: 0 % (ref 0.0–0.2)

## 2018-05-12 LAB — MAGNESIUM: Magnesium: 2.2 mg/dL (ref 1.7–2.4)

## 2018-05-12 MED ORDER — METHYLPREDNISOLONE SODIUM SUCC 125 MG IJ SOLR
60.0000 mg | Freq: Four times a day (QID) | INTRAMUSCULAR | Status: DC
  Administered 2018-05-12 – 2018-05-13 (×5): 60 mg via INTRAVENOUS
  Filled 2018-05-12 (×5): qty 2

## 2018-05-12 MED ORDER — PANTOPRAZOLE SODIUM 40 MG PO TBEC
40.0000 mg | DELAYED_RELEASE_TABLET | Freq: Every day | ORAL | Status: DC
  Administered 2018-05-12 – 2018-05-20 (×9): 40 mg via ORAL
  Filled 2018-05-12 (×9): qty 1

## 2018-05-12 MED ORDER — GUAIFENESIN ER 600 MG PO TB12
1200.0000 mg | ORAL_TABLET | Freq: Two times a day (BID) | ORAL | Status: DC
  Administered 2018-05-12 – 2018-05-20 (×17): 1200 mg via ORAL
  Filled 2018-05-12 (×17): qty 2

## 2018-05-12 NOTE — Care Management Note (Signed)
Case Management Note  Patient Details  Name: [name] MRN: [id number] Date of Birth: 18-Mar-1946  Subjective/Objective:                  COPD exacerbation and respiratory failure. Improving very slowly.   Action/Plan: Following for progression of care. Following for cm needs, none present at this time, no discharge plans at this time. Lives alone Expected Discharge Date:                  Expected Discharge Plan:  Home/Self Care  In-House Referral:     Discharge planning Services  CM Consult  Post Acute Care Choice:    Choice offered to:     DME Arranged:    DME Agency:     HH Arranged:    HH Agency:     Status of Service:  In process, will continue to follow  If discussed at Long Length of Stay Meetings, dates discussed:    Additional Comments:  Leeroy Cha, RN 05/12/2018, 9:32 AM

## 2018-05-12 NOTE — Progress Notes (Signed)
PROGRESS NOTE    [name]  ZTI:[id number] DOB: 03-11-1946 DOA: 05/08/2018 PCP: Crystal Sacramento, MD   Brief Narrative:  HPI on 05/09/2018 by Dr. Judithann Sauger F [name] a 72 y.o.femalewith medical history significant forCOPD with chronic hypoxic respiratory failure, hypertension, and chronic bilateral leg swelling and ulcerations, now presenting to the emergency department for evaluation of shortness of breath and productive cough. Patient was seen in her pulmonology clinic on 05/03/2018 with productive cough and increased dyspnea, was diagnosed with acute exacerbation in COPD and started on prednisone and azithromycin. She failed to improve, was seen back on 05/06/2018 and reports that her prednisone course was extended. Unfortunately, she still has not had any significant improvement and may actually be worsening. She has been dyspneic at rest for the past couple days and has had difficulty sleeping due to this. She denies fevers, chills, or chest pain. Reports that her chronic bilateral lower extremity swelling is not as bad as usual.  **She was Admitted for COPD exacerbation and respiratory failure. Improving very slowly.  Breathing treatments have been adjusted currently  Assessment & Plan:   Principal Problem:   COPD with acute exacerbation ([place]) Active Problems:   Essential hypertension   Chronic respiratory failure with hypoxia (H[facility])   Hyponatremia   Acute urinary retention  Acute on chronic hypoxic respiratory failure with COPD Exacerbation -Presented with worsening shortness of breath, cough despite being treated with prednisone and azithromycin since 05/03/2018 -Afebrile on admission -Chest x-ray reviewed, unremarkable -Patient did require BiPAP when admitted but now has improved and is on oxygen via nasal cannula -Was treated with albuterol nebulizers x3, prednisone as well as Levaquin in the ED -Pending sputum culture if able -Continue nebulizer  treatments, Solu-Medrol, Levaquin; currently on duo nebs scheduled every 6 hours, along with albuterol nebs 2.5 every 4 PRN for wheezing shortness breath -Continue with levofloxacin 500 mg IV every 24 and will stop after today's dose -Added guaifenesin 1200 mg p.o. twice daily, flutter valve, incentive spirometer -Transitioned to nasal cannula, 2L -Continues to have cough-we will continue hyocodan, tessalon; robituss/dextromethorphan changed to just scheduled guaifenesin 1200 g p.o. twice daily -Continue with methylprednisolone 60 mg IV every 6 along with Dulera 2 puffs inhalations twice daily along with Incruse Ellipta 1 puff inhalations daily -No Longer Smoking   Hyponatremia -Sodium 130 on admission, up to 139 -IVF discontinued  -Continue to Monitor and Repeat CMP in AM  Essential Hypertension -Continue Diltiazem 360 mg p.o. daily and Losartan 100 mg po Daily   Alcohol use -Regarding this and she recently received Ativan -Continue CIWA protocol -Continue with multivitamin, folic acid, and thiamine -Continue with IV lorazepam 0-4 milligrams every 12 hours the next 48 hours  History of pulmonary hypertension -Follows with cardiology and will need to follow-up with cardiology in outpatient setting  Coronary artery disease -Currently Stable -Follows up with cardiology in outpatient setting  Hyperglycemia  -In the setting of IV steroid demargination -Hemoglobin A1c to rule out diabetes -If blood sugars remain consistently elevated will place on sensitive NovoLog sliding scale insulin AC and at bedtime  Leukocytosis -In the setting of IV steroid demargination -Continue to monitor for signs and symptoms of infection -Continue with IV Levaquin as above -Repeat CBC in the a.m.  DVT prophylaxis: Enoxaparin 40 mg sq q24h Code Status: FULL CODE Family Communication: No family present at bedside Disposition Plan: If improved will transfer to the medical floor with Telemetry    Consultants:   None  Procedures:  None  Antimicrobials:  Anti-infectives (From admission, onward)   Start     Dose/Rate Route Frequency Ordered Stop   05/09/18 2200  levofloxacin (LEVAQUIN) IVPB 500 mg     500 mg 100 mL/hr over 60 Minutes Intravenous Every 24 hours 05/09/18 0211 05/14/18 2159   05/08/18 2215  levofloxacin (LEVAQUIN) IVPB 500 mg     500 mg 100 mL/hr over 60 Minutes Intravenous  Once 05/08/18 2214 05/08/18 2343     Subjective: Examined at bedside states that she had a rough night and still having trouble quality with breathing.  No chest pain, lightheadedness but did remain short of breath and had some wheezing.  No nausea or vomiting.  No other concerns or complaints at this time.  Objective: Vitals:   05/12/18 0230 05/12/18 0358 05/12/18 0400 05/12/18 0617  BP:   136/76 (!) 147/75  Pulse:   71 77  Resp:   (!) 25   Temp:  98.5 F (36.9 C)    TempSrc:  Oral    SpO2: 99%  97%   Weight:      Height:        Intake/Output Summary (Last 24 hours) at 05/12/2018 0740 Last data filed at 05/11/2018 1400 Gross per 24 hour  Intake 910.59 ml  Output 300 ml  Net 610.59 ml   Filed Weights   05/08/18 2035 05/09/18 0346  Weight: 47.6 kg 48.5 kg   Examination: Physical Exam:  Constitutional: Thin Caucasian female in NAD and appears anxious  Eyes: Lids and conjunctivae normal, sclerae anicteric  ENMT: External Ears, Nose appear normal. Grossly normal hearing.  Neck: Appears normal, supple, no cervical masses, normal ROM, no appreciable thyromegaly; no JVD Respiratory: Diminished to auscultation bilaterally with wheezing; No appreciable rales, rhonchi or crackles. Normal respiratory effort and patient is not tachypenic. No accessory muscle use. Wearing supplemental O2 via [place] Cardiovascular: RRR, no murmurs / rubs / gallops. S1 and S2 auscultated. No appreciable LE extremity edema. Abdomen: Soft, non-tender, non-distended. No masses palpated. No appreciable  hepatosplenomegaly. Bowel sounds positive x4.  GU: Deferred. Musculoskeletal: No clubbing / cyanosis of digits/nails. Normal strength and muscle tone.  Skin: No rashes, lesions, ulcers on a limited skin evaluation. No induration; Warm and dry.  Neurologic: CN 2-12 grossly intact with no focal deficits. Romberg sign and cerebellar reflexes not assessed.  Psychiatric: Normal judgment and insight. Alert and oriented x 3. Slightly anxious mood and appropriate affect.   Data Reviewed: I have personally reviewed following labs and imaging studies  CBC: Recent Labs  Lab 05/08/18 2105 05/10/18 0348  WBC 7.5 10.2  HGB 14.6 13.3  HCT 45.2 42.0  MCV 90.8 91.9  PLT 240 829   Basic Metabolic Panel: Recent Labs  Lab 05/08/18 2105 05/09/18 0557 05/10/18 0348  NA 130* 133* 136  K 4.0 4.0 3.5  CL 91* 93* 97*  CO2 30 31 32  GLUCOSE 139* 189* 179*  BUN _0 CREATININE 0.40* 0.38* 0.37*  CALCIUM 9.3 8.7* 8.5*   GFR: Estimated Creatinine Clearance: 48.7 mL/min (A) (by C-G formula based on SCr of 0.37 mg/dL (L)). Liver Function Tests: No results for input(s): AST, ALT, ALKPHOS, BILITOT, PROT, ALBUMIN in the last 168 hours. No results for input(s): LIPASE, AMYLASE in the last 168 hours. No results for input(s): AMMONIA in the last 168 hours. Coagulation Profile: No results for input(s): INR, PROTIME in the last 168 hours. Cardiac Enzymes: No results for input(s): CKTOTAL, CKMB, CKMBINDEX, TROPONINI in  the last 168 hours. BNP (last 3 results) No results for input(s): PROBNP in the last 8760 hours. HbA1C: No results for input(s): HGBA1C in the last 72 hours. CBG: Recent Labs  Lab 05/09/18 0740 05/10/18 0833 05/11/18 0419 05/12/18 0730  GLUCAP 177* 197* 209* 155*   Lipid Profile: No results for input(s): CHOL, HDL, LDLCALC, TRIG, CHOLHDL, LDLDIRECT in the last 72 hours. Thyroid Function Tests: No results for input(s): TSH, T4TOTAL, FREET4, T3FREE, THYROIDAB in the last 72  hours. Anemia Panel: No results for input(s): VITAMINB12, FOLATE, FERRITIN, TIBC, IRON, RETICCTPCT in the last 72 hours. Sepsis Labs: No results for input(s): PROCALCITON, LATICACIDVEN in the last 168 hours.  Recent Results (from the past 240 hour(s))  MRSA PCR Screening     Status: None   Collection Time: 05/09/18  3:41 AM  Result Value Ref Range Status   MRSA by PCR NEGATIVE NEGATIVE Final    Comment:        The GeneXpert MRSA Assay (FDA approved for NASAL specimens only), is one component of a comprehensive MRSA colonization surveillance program. It is not intended to diagnose MRSA infection nor to guide or monitor treatment for MRSA infections. Performed at [facility], [place] [address], [place], [place] [address]     Radiology Studies: Dg Chest Port 1 View  Result Date: 05/11/2018 CLINICAL DATA:  Shortness of breath EXAM: PORTABLE CHEST 1 VIEW COMPARISON:  05/08/2018 FINDINGS: Cardiac shadow is stable. Aortic calcifications are again seen. Lungs are hyperinflated bilaterally. Mild fibrotic changes are again identified. No focal confluent infiltrate or sizable effusion is seen. IMPRESSION: COPD chronic fibrotic changes without acute abnormality. Electronically Signed   By: Inez Catalina M.D.   On: 05/11/2018 08:49   Scheduled Meds: . benzonatate  200 mg Oral TID  . buPROPion  150 mg Oral Daily  . cholecalciferol  1,000 Units Oral Daily  . diltiazem  360 mg Oral Daily  . enoxaparin (LOVENOX) injection  40 mg Subcutaneous Q24H  . folic acid  1 mg Oral Daily  . ipratropium-albuterol  3 mL Nebulization Q6H  . LORazepam  0-4 mg Intravenous Q12H  . losartan  100 mg Oral Daily  . mouth rinse  15 mL Mouth Rinse BID  . methylPREDNISolone (SOLU-MEDROL) injection  60 mg Intravenous Q12H  . mometasone-formoterol  2 puff Inhalation BID  . multivitamin with minerals  1 tablet Oral Daily  . sodium chloride flush  3 mL Intravenous Q12H  . thiamine  100 mg Oral Daily     Or  . thiamine  100 mg Intravenous Daily  . umeclidinium bromide  1 puff Inhalation Daily   Continuous Infusions: . levofloxacin (LEVAQUIN) IV Stopped (05/12/18 0000)    LOS: 2 days   Kerney Elbe, DO [facility] PAGER is on AMION  If 7PM-7AM, please contact night-coverage [email or web address] Password [email or web address] 05/12/2018, 7:40 AM

## 2018-05-13 ENCOUNTER — Inpatient Hospital Stay (HOSPITAL_COMMUNITY): Payer: Medicare Other

## 2018-05-13 LAB — GLUCOSE, CAPILLARY: GLUCOSE-CAPILLARY: 160 mg/dL — AB (ref 70–99)

## 2018-05-13 LAB — CBC WITH DIFFERENTIAL/PLATELET
Abs Immature Granulocytes: 0.12 10*3/uL — ABNORMAL HIGH (ref 0.00–0.07)
BASOS ABS: 0 10*3/uL (ref 0.0–0.1)
Basophils Relative: 0 %
EOS ABS: 0 10*3/uL (ref 0.0–0.5)
EOS PCT: 0 %
HEMATOCRIT: 43.1 % (ref 36.0–46.0)
HEMOGLOBIN: 13.5 g/dL (ref 12.0–15.0)
IMMATURE GRANULOCYTES: 1 %
LYMPHS ABS: 0.2 10*3/uL — AB (ref 0.7–4.0)
LYMPHS PCT: 2 %
MCH: 29.7 pg (ref 26.0–34.0)
MCHC: 31.3 g/dL (ref 30.0–36.0)
MCV: 94.7 fL (ref 80.0–100.0)
Monocytes Absolute: 0.4 10*3/uL (ref 0.1–1.0)
Monocytes Relative: 5 %
NEUTROS PCT: 92 %
NRBC: 0 % (ref 0.0–0.2)
Neutro Abs: 8.3 10*3/uL — ABNORMAL HIGH (ref 1.7–7.7)
Platelets: 219 10*3/uL (ref 150–400)
RBC: 4.55 MIL/uL (ref 3.87–5.11)
RDW: 14.1 % (ref 11.5–15.5)
WBC: 9 10*3/uL (ref 4.0–10.5)

## 2018-05-13 LAB — MAGNESIUM: Magnesium: 2.2 mg/dL (ref 1.7–2.4)

## 2018-05-13 LAB — PHOSPHORUS: Phosphorus: 3.1 mg/dL (ref 2.5–4.6)

## 2018-05-13 LAB — COMPREHENSIVE METABOLIC PANEL
ALBUMIN: 2.9 g/dL — AB (ref 3.5–5.0)
ALT: 23 U/L (ref 0–44)
ANION GAP: 5 (ref 5–15)
AST: 14 U/L — AB (ref 15–41)
Alkaline Phosphatase: 34 U/L — ABNORMAL LOW (ref 38–126)
BUN: 31 mg/dL — AB (ref 8–23)
CO2: 34 mmol/L — AB (ref 22–32)
Calcium: 8.5 mg/dL — ABNORMAL LOW (ref 8.9–10.3)
Chloride: 102 mmol/L (ref 98–111)
Creatinine, Ser: 0.4 mg/dL — ABNORMAL LOW (ref 0.44–1.00)
GFR calc Af Amer: >60 mL/min (ref >60)
GFR calc non Af Amer: >60 mL/min (ref >60)
GLUCOSE: 185 mg/dL — AB (ref 70–99)
POTASSIUM: 3.6 mmol/L (ref 3.5–5.1)
SODIUM: 141 mmol/L (ref 135–145)
Total Bilirubin: 0.4 mg/dL (ref 0.3–1.2)
Total Protein: 5.3 g/dL — ABNORMAL LOW (ref 6.5–8.1)

## 2018-05-13 LAB — HEMOGLOBIN A1C
Hgb A1c MFr Bld: 6.1 % — ABNORMAL HIGH (ref 4.8–5.6)
MEAN PLASMA GLUCOSE: 128.37 mg/dL

## 2018-05-13 MED ORDER — ARFORMOTEROL TARTRATE 15 MCG/2ML IN NEBU
15.0000 ug | INHALATION_SOLUTION | Freq: Two times a day (BID) | RESPIRATORY_TRACT | Status: DC
  Administered 2018-05-13 – 2018-05-20 (×14): 15 ug via RESPIRATORY_TRACT
  Filled 2018-05-13 (×14): qty 2

## 2018-05-13 MED ORDER — BUDESONIDE 0.25 MG/2ML IN SUSP
0.2500 mg | Freq: Two times a day (BID) | RESPIRATORY_TRACT | Status: DC
  Administered 2018-05-13 – 2018-05-20 (×14): 0.25 mg via RESPIRATORY_TRACT
  Filled 2018-05-13 (×14): qty 2

## 2018-05-13 MED ORDER — FUROSEMIDE 10 MG/ML IJ SOLN
40.0000 mg | Freq: Once | INTRAMUSCULAR | Status: AC
  Administered 2018-05-13: 40 mg via INTRAVENOUS
  Filled 2018-05-13: qty 4

## 2018-05-13 MED ORDER — METHYLPREDNISOLONE SODIUM SUCC 125 MG IJ SOLR
60.0000 mg | Freq: Three times a day (TID) | INTRAMUSCULAR | Status: DC
  Administered 2018-05-14 (×2): 60 mg via INTRAVENOUS
  Filled 2018-05-13 (×2): qty 2

## 2018-05-13 NOTE — Progress Notes (Signed)
SATURATION QUALIFICATIONS: (This note is used to comply with regulatory documentation for home oxygen)  Patient Saturations on Room Air at Rest = 91%  Patient Saturations on Room Air while Ambulating = XXX  Did not ambulate patient on room air. When patient sat on the edge of the bed on room air. Her oxygen saturation dropped to 82% O2 applied at that point. Pt sat for 5 minutes and recovered saturation to 92% on 3 Liters.  Patient Saturations on 3 Liters of oxygen while Ambulating = 88%  Please briefly explain why patient needs home oxygen: See above. Patient requires oxygen with any movement or change in position. Patient takes a moderate amount of time to recover her oxygen saturation when she does exert herself.

## 2018-05-13 NOTE — Progress Notes (Signed)
PROGRESS NOTE    [name]  KXF:[id number] DOB: [birth year] DOA: 05/08/2018 PCP: Christain Sacramento, MD   Brief Narrative:  HPI on 05/09/2018 by Dr. Judithann Sauger F [name] a 72 y.o.femalewith medical history significant forCOPD with chronic hypoxic respiratory failure, hypertension, and chronic bilateral leg swelling and ulcerations, now presenting to the emergency department for evaluation of shortness of breath and productive cough. Patient was seen in her pulmonology clinic on 05/03/2018 with productive cough and increased dyspnea, was diagnosed with acute exacerbation in COPD and started on prednisone and azithromycin. She failed to improve, was seen back on 05/06/2018 and reports that her prednisone course was extended. Unfortunately, she still has not had any significant improvement and may actually be worsening. She has been dyspneic at rest for the past couple days and has had difficulty sleeping due to this. She denies fevers, chills, or chest pain. Reports that her chronic bilateral lower extremity swelling is not as bad as usual.  **She was Admitted for COPD exacerbation and respiratory failure. Improving very slowly.  Breathing treatments have been adjusted currently and I discontinued her Dulera and started her on Brovana and Budesonide.  She had an ambulatory O2 screen done today and desaturated on room air to 82% just ambulating to the edge of the bed.  She is placed on 3 L of oxygen via nasal cannula and remains slightly hypoxic.  She did recover after some time and will likely need oxygen at discharge.  Assessment & Plan:   Principal Problem:   COPD with acute exacerbation ([place]) Active Problems:   Essential hypertension   Chronic respiratory failure with hypoxia ([facility])   Hyponatremia   Acute urinary retention  Acute on chronic hypoxic respiratory failure with COPD Exacerbation -Presented with worsening shortness of breath, cough despite being treated with  prednisone and azithromycin since 05/03/2018 -Afebrile on admission -Chest x-ray 05/08/2018 showed cardiac enlargement and emphysematous changes and fibrosis of the lungs with no evidence of active pulmonary disease -Patient did require BiPAP when admitted but now has improved and is on oxygen via nasal cannula -Was treated with albuterol nebulizers x3, prednisone as well as Levaquin in the ED -Pending sputum culture if able -Continue nebulizer treatments, Solu-Medrol, Levaquin now discontinued; currently on duo nebs scheduled every 6 hours, along with albuterol nebs 2.5 every 4 PRN for wheezing shortness breath -Continued with levofloxacin 500 mg IV every 24 and now discontinued -Added guaifenesin 1200 mg p.o. twice daily, flutter valve, incentive spirometer -Transitioned to nasal cannula, 2L but then she desaturated on home O2 screen -Continues to have cough-we will continue hyocodan, tessalon; robituss/dextromethorphan changed to just scheduled guaifenesin 1200 g p.o. twice daily -Continue with methylprednisolone 60 mg IV every 6h but will transition down to every 8h -Dulera 2 puffs inhalations twice daily changed to Brovana and budesonide nebulizers -C/w Incruse Ellipta 1 puff inhalations daily -No Longer Smoking  -Transfer to Medical Floor with Telemetry -Will give 1x dose of IV Lasix -Chest x-ray this morning showed tonic lung disease with lower lung volumes but no definite acute cardiopulmonary abnormality  Hyponatremia -Sodium 130 on admission, up to 141 -IVF discontinued  -Continue to Monitor and Repeat CMP in AM  Essential Hypertension -Continue Diltiazem 360 mg p.o. daily and Losartan 100 mg po Daily   Alcohol use -Regarding this and she recently received Ativan -Continue CIWA protocol -Continue with multivitamin, folic acid, and thiamine -Continue with IV lorazepam 0-4 milligrams every 12 hours the next 48 hours  History of  pulmonary hypertension -Follows with  cardiology and will need to follow-up with cardiology in outpatient setting -Will give 1x of IV Lasix  Coronary artery disease -Currently Stable -Follows up with cardiology in outpatient setting  Hyperglycemia  -In the setting of IV steroid demargination -Hemoglobin A1c to rule out diabetes -If blood sugars remain consistently elevated will place on sensitive NovoLog sliding scale insulin AC and at bedtime -CBG's ranging from 155-160  Leukocytosis, improved -In the setting of IV steroid demargination -WBC went from 10.2 -> 10.9 -> 9.0 -Continue to monitor for signs and symptoms of infection -Continued with IV Levaquin as above -Repeat CBC in the a.m.  DVT prophylaxis: Enoxaparin 40 mg sq q24h Code Status: FULL CODE Family Communication: No family present at bedside Disposition Plan: Transfer to [facility] with Telemetry   Consultants:   None   Procedures:  None  Antimicrobials:  Anti-infectives (From admission, onward)   Start     Dose/Rate Route Frequency Ordered Stop   05/09/18 2200  levofloxacin (LEVAQUIN) IVPB 500 mg     500 mg 100 mL/hr over 60 Minutes Intravenous Every 24 hours 05/09/18 0211 05/12/18 2200   05/08/18 2215  levofloxacin (LEVAQUIN) IVPB 500 mg     500 mg 100 mL/hr over 60 Minutes Intravenous  Once 05/08/18 2214 05/08/18 2343     Subjective: Seen and examined at bedside and states that she is doing better this morning that she was last night.  Nursing reports that she had a panic attack and felt that she could not breathe however saturations 98%.  No chest pain, lightheadedness or dizziness.  Patient had amatory screen done and she desaturated this morning.  No nausea or vomiting.  Eating breakfast.  No other concerns or complaints at the time and says she feels a little bit better.  Objective: Vitals:   05/13/18 0900 05/13/18 1000 05/13/18 1100 05/13/18 1121  BP: (!) 173/93 (!) 170/80 (!) 189/82 (!) 171/89  Pulse: 99 (!) 103 (!) 109 (!) 105  Resp: 15  (!) 23 20 (!) 22  Temp:      TempSrc:      SpO2: 93% 98% 93% 97%  Weight:      Height:        Intake/Output Summary (Last 24 hours) at 05/13/2018 1212 Last data filed at 05/13/2018 0800 Gross per 24 hour  Intake 380 ml  Output 375 ml  Net 5 ml   Filed Weights   05/08/18 2035 05/09/18 0346  Weight: 47.6 kg 48.5 kg   Examination: Physical Exam:  Constitutional: Thin Caucasian female currently no acute distress appears anxious and is eating breakfast Eyes: Lids and conjunctive normal.  Sclera anicteric ENMT: External ears and nose appear normal.  Grossly normal hearing Neck: Supple with no JVD Respiratory: Diminished to auscultation bilaterally and wheezing has much improved.  Has some coarse breath sounds.  Has normal respiratory effort and she is not tachypneic but she is wearing supplemental oxygen via nasal cannula Cardiovascular: Slightly tachycardic rate but regular rhythm.  No appreciable lower extremity edema Abdomen: Soft, nontender, nondistended.  Bowel sounds present in 4 quadrants GU: Deferred Musculoskeletal: No contractures or cyanosis.  No joint deformities noted Skin: Skin is warm and dry no appreciable rashes or lesions limited skin evaluation Neurologic: Cranial nerves II through XII grossly intact no appreciable focal deficits Psychiatric: Normal judgment and insight.  Patient is awake and alert and oriented.  Has an anxious mood but appropriate affect  Data Reviewed: I have personally reviewed following  labs and imaging studies  CBC: Recent Labs  Lab 05/08/18 2105 05/10/18 0348 05/12/18 0756 05/13/18 0337  WBC 7.5 10.2 10.9* 9.0  NEUTROABS  --   --  9.7* 8.3*  HGB 14.6 13.3 14.2 13.5  HCT 45.2 42.0 45.4 43.1  MCV 90.8 91.9 92.3 94.7  PLT 240 228 245 443   Basic Metabolic Panel: Recent Labs  Lab 05/08/18 2105 05/09/18 0557 05/10/18 0348 05/12/18 0756 05/13/18 0337  NA 130* 133* 136 139 141  K 4.0 4.0 3.5 4.2 3.6  CL 91* 93* 97* 96* 102    CO2 30 31 32 38* 34*  GLUCOSE 139* 189* 179* 154* 185*  BUN _0 31*  CREATININE 0.40* 0.38* 0.37* 0.45 0.40*  CALCIUM 9.3 8.7* 8.5* 8.8* 8.5*  MG  --   --   --  2.2 2.2  PHOS  --   --   --  2.9 3.1   GFR: Estimated Creatinine Clearance: 48.7 mL/min (A) (by C-G formula based on SCr of 0.4 mg/dL (L)). Liver Function Tests: Recent Labs  Lab 05/12/18 0756 05/13/18 0337  AST 16 14*  ALT 24 23  ALKPHOS 36* 34*  BILITOT 0.4 0.4  PROT 5.6* 5.3*  ALBUMIN 3.2* 2.9*   No results for input(s): LIPASE, AMYLASE in the last 168 hours. No results for input(s): AMMONIA in the last 168 hours. Coagulation Profile: No results for input(s): INR, PROTIME in the last 168 hours. Cardiac Enzymes: No results for input(s): CKTOTAL, CKMB, CKMBINDEX, TROPONINI in the last 168 hours. BNP (last 3 results) No results for input(s): PROBNP in the last 8760 hours. HbA1C: No results for input(s): HGBA1C in the last 72 hours. CBG: Recent Labs  Lab 05/09/18 0740 05/10/18 0833 05/11/18 0419 05/12/18 0730 05/13/18 0758  GLUCAP 177* 197* 209* 155* 160*   Lipid Profile: No results for input(s): CHOL, HDL, LDLCALC, TRIG, CHOLHDL, LDLDIRECT in the last 72 hours. Thyroid Function Tests: No results for input(s): TSH, T4TOTAL, FREET4, T3FREE, THYROIDAB in the last 72 hours. Anemia Panel: No results for input(s): VITAMINB12, FOLATE, FERRITIN, TIBC, IRON, RETICCTPCT in the last 72 hours. Sepsis Labs: No results for input(s): PROCALCITON, LATICACIDVEN in the last 168 hours.  Recent Results (from the past 240 hour(s))  MRSA PCR Screening     Status: None   Collection Time: 05/09/18  3:41 AM  Result Value Ref Range Status   MRSA by PCR NEGATIVE NEGATIVE Final    Comment:        The GeneXpert MRSA Assay (FDA approved for NASAL specimens only), is one component of a comprehensive MRSA colonization surveillance program. It is not intended to diagnose MRSA infection nor to guide or monitor  treatment for MRSA infections. Performed at [facility], [place] [address], [place], [place] [address]     Radiology Studies: Dg Chest Port 1 View  Result Date: 05/13/2018 CLINICAL DATA:  72 year old female with suspected COPD exacerbation earlier this month but failed to improve on medication. Acute on chronic respiratory failure. EXAM: PORTABLE CHEST 1 VIEW COMPARISON:  05/11/2018 and earlier. FINDINGS: Portable AP semi upright view at 0426 hours. Lower lung volumes. Stable cardiac size and mediastinal contours. Evidence of emphysema. No pneumothorax, definite pleural effusion, or confluent pulmonary opacity. Stable cardiac size and mediastinal contours. Visualized tracheal air column is within normal limits. Paucity bowel gas in the upper abdomen. IMPRESSION: Chronic lung disease with lower lung volumes today but no definite acute cardiopulmonary abnormality. Electronically Signed   By:  Genevie Ann M.D.   On: 05/13/2018 07:28   Scheduled Meds: . arformoterol  15 mcg Nebulization BID  . benzonatate  200 mg Oral TID  . budesonide (PULMICORT) nebulizer solution  0.25 mg Nebulization BID  . buPROPion  150 mg Oral Daily  . cholecalciferol  1,000 Units Oral Daily  . diltiazem  360 mg Oral Daily  . enoxaparin (LOVENOX) injection  40 mg Subcutaneous Q24H  . folic acid  1 mg Oral Daily  . guaiFENesin  1,200 mg Oral BID  . ipratropium-albuterol  3 mL Nebulization Q6H  . losartan  100 mg Oral Daily  . mouth rinse  15 mL Mouth Rinse BID  . methylPREDNISolone (SOLU-MEDROL) injection  60 mg Intravenous Q8H  . multivitamin with minerals  1 tablet Oral Daily  . pantoprazole  40 mg Oral Daily  . sodium chloride flush  3 mL Intravenous Q12H  . thiamine  100 mg Oral Daily  . umeclidinium bromide  1 puff Inhalation Daily   Continuous Infusions:   LOS: 3 days   Kerney Elbe, DO [facility] PAGER is on [place]  If 7PM-7AM, please contact  night-coverage [email or web address] Password [email or web address] 05/13/2018, 12:12 PM

## 2018-05-14 ENCOUNTER — Inpatient Hospital Stay (HOSPITAL_COMMUNITY): Payer: Medicare Other

## 2018-05-14 LAB — COMPREHENSIVE METABOLIC PANEL
ALT: 24 U/L (ref 0–44)
ANION GAP: 8 (ref 5–15)
AST: 15 U/L (ref 15–41)
Albumin: 2.9 g/dL — ABNORMAL LOW (ref 3.5–5.0)
Alkaline Phosphatase: 33 U/L — ABNORMAL LOW (ref 38–126)
BUN: 37 mg/dL — ABNORMAL HIGH (ref 8–23)
CO2: 35 mmol/L — ABNORMAL HIGH (ref 22–32)
Calcium: 8.6 mg/dL — ABNORMAL LOW (ref 8.9–10.3)
Chloride: 98 mmol/L (ref 98–111)
Creatinine, Ser: 0.47 mg/dL (ref 0.44–1.00)
GFR calc non Af Amer: >60 mL/min (ref >60)
Glucose, Bld: 186 mg/dL — ABNORMAL HIGH (ref 70–99)
Potassium: 4 mmol/L (ref 3.5–5.1)
Sodium: 141 mmol/L (ref 135–145)
Total Bilirubin: 0.6 mg/dL (ref 0.3–1.2)
Total Protein: 5.2 g/dL — ABNORMAL LOW (ref 6.5–8.1)

## 2018-05-14 LAB — CBC WITH DIFFERENTIAL/PLATELET
Abs Immature Granulocytes: 0.09 10*3/uL — ABNORMAL HIGH (ref 0.00–0.07)
Basophils Absolute: 0 10*3/uL (ref 0.0–0.1)
Basophils Relative: 0 %
EOS PCT: 0 %
Eosinophils Absolute: 0 10*3/uL (ref 0.0–0.5)
HCT: 45.7 % (ref 36.0–46.0)
Hemoglobin: 14 g/dL (ref 12.0–15.0)
Immature Granulocytes: 1 %
Lymphocytes Relative: 2 %
Lymphs Abs: 0.2 10*3/uL — ABNORMAL LOW (ref 0.7–4.0)
MCH: 28.4 pg (ref 26.0–34.0)
MCHC: 30.6 g/dL (ref 30.0–36.0)
MCV: 92.7 fL (ref 80.0–100.0)
Monocytes Absolute: 0.4 10*3/uL (ref 0.1–1.0)
Monocytes Relative: 4 %
Neutro Abs: 9.2 10*3/uL — ABNORMAL HIGH (ref 1.7–7.7)
Neutrophils Relative %: 93 %
Platelets: 239 10*3/uL (ref 150–400)
RBC: 4.93 MIL/uL (ref 3.87–5.11)
RDW: 14 % (ref 11.5–15.5)
WBC: 9.9 10*3/uL (ref 4.0–10.5)
nRBC: 0 % (ref 0.0–0.2)

## 2018-05-14 LAB — MAGNESIUM: Magnesium: 2.1 mg/dL (ref 1.7–2.4)

## 2018-05-14 LAB — GLUCOSE, CAPILLARY: Glucose-Capillary: 157 mg/dL — ABNORMAL HIGH (ref 70–99)

## 2018-05-14 LAB — PHOSPHORUS: Phosphorus: 3.7 mg/dL (ref 2.5–4.6)

## 2018-05-14 MED ORDER — METHYLPREDNISOLONE SODIUM SUCC 125 MG IJ SOLR
60.0000 mg | Freq: Two times a day (BID) | INTRAMUSCULAR | Status: DC
  Administered 2018-05-14 – 2018-05-16 (×4): 60 mg via INTRAVENOUS
  Filled 2018-05-14 (×4): qty 2

## 2018-05-14 MED ORDER — SODIUM CHLORIDE 3 % IN NEBU
4.0000 mL | INHALATION_SOLUTION | Freq: Every day | RESPIRATORY_TRACT | Status: AC
  Administered 2018-05-15 – 2018-05-16 (×2): 4 mL via RESPIRATORY_TRACT
  Filled 2018-05-14 (×3): qty 4

## 2018-05-14 NOTE — Progress Notes (Signed)
BIPAP not indicated at this time.

## 2018-05-14 NOTE — Care Management Important Message (Signed)
Important Message  Patient Details  Name: [name] MRN: [id number] Date of Birth: [birth year]   Medicare Important Message Given:  Yes    Kerin Salen 05/14/2018, 10:32 AMImportant Message  Patient Details  Name: [name] MRN: [id number] Date of Birth: 1945-12-28   Medicare Important Message Given:  Yes    Kerin Salen 05/14/2018, 10:32 AM

## 2018-05-14 NOTE — Plan of Care (Signed)

## 2018-05-14 NOTE — Progress Notes (Signed)
PROGRESS NOTE    [name]  OBS:[id number] DOB: 1946-03-24 DOA: 05/08/2018 PCP: Christain Sacramento, MD   Brief Narrative:  HPI on 05/09/2018 by Dr. Judithann Sauger F [name] a 72 y.o.femalewith medical history significant forCOPD with chronic hypoxic respiratory failure, hypertension, and chronic bilateral leg swelling and ulcerations, now presenting to the emergency department for evaluation of shortness of breath and productive cough. Patient was seen in her pulmonology clinic on 05/03/2018 with productive cough and increased dyspnea, was diagnosed with acute exacerbation in COPD and started on prednisone and azithromycin. She failed to improve, was seen back on 05/06/2018 and reports that her prednisone course was extended. Unfortunately, she still has not had any significant improvement and may actually be worsening. She has been dyspneic at rest for the past couple days and has had difficulty sleeping due to this. She denies fevers, chills, or chest pain. Reports that her chronic bilateral lower extremity swelling is not as bad as usual.  **She was Admitted for COPD exacerbation and respiratory failure. Improving very slowly.  Breathing treatments have been adjusted currently and I discontinued her Dulera and started her on Brovana and Budesonide.  She had an ambulatory O2 screen done today and desaturated on room air to 82% just ambulating to the edge of the bed.  She is placed on 3 L of oxygen via nasal cannula and remains slightly hypoxic.  She did recover after some time and will likely need oxygen at discharge. IV Steroids are weaning and now on 60 mg q12h. Will repeat Home O2 Screen today.   Assessment & Plan:   Principal Problem:   COPD with acute exacerbation ([place]) Active Problems:   Essential hypertension   Chronic respiratory failure with hypoxia (HCC)   Hyponatremia   Acute urinary retention  Acute on chronic hypoxic respiratory failure with COPD  Exacerbation -Presented with worsening shortness of breath, cough despite being treated with prednisone and azithromycin since 05/03/2018 -Afebrile on admission -Chest x-ray 05/08/2018 showed cardiac enlargement and emphysematous changes and fibrosis of the lungs with no evidence of active pulmonary disease -Patient did require BiPAP when admitted but now has improved and is on oxygen via nasal cannula -Was treated with albuterol nebulizers x3, prednisone as well as Levaquin in the ED -Pending sputum culture if able -Continue nebulizer treatments, Solu-Medrol, Levaquin now discontinued; currently on duo nebs scheduled every 6 hours, along with albuterol nebs 2.5 every 4 PRN for wheezing shortness breath -Continued with levofloxacin 500 mg IV every 24 and now discontinued -Added Guaifenesin 1200 mg p.o. twice daily, flutter valve, incentive spirometer -Transitioned to nasal cannula, 2L but then she desaturated on home O2 screen -Continues to have cough-we will continue hyocodan, tessalon; robituss/dextromethorphan changed to just scheduled guaifenesin 1200 g p.o. twice daily -Continue with Methylprednisolone 60 mg IV every 6h but will transition down to every 8h and now q12h -Dulera 2 puffs inhalations twice daily changed to Brovana and budesonide nebulizers -C/w Incruse Ellipta 1 puff inhalations daily -No Longer Smoking  -Transfer to Medical Floor with Telemetry -Given 1x dose of IV Lasix -Chest x-ray this AM showed   Hyponatremia -Sodium 130 on admission, up to 141 -IVF discontinued  -Continue to Monitor and Repeat CMP in AM  Essential Hypertension -Continue Diltiazem 360 mg p.o. daily and Losartan 100 mg po Daily   Alcohol use -Regarding this and she recently received Ativan -Continue CIWA protocol -Continue with multivitamin, folic acid, and thiamine -Continue with IV lorazepam 0-4 milligrams every 12 hours  the next 48 hours  History of pulmonary hypertension -Follows with  cardiology and will need to follow-up with cardiology in outpatient setting -Given 1x of IV Lasix  -Patient is -505.1 Liters  Coronary artery disease -Currently Stable -Follows up with cardiology in outpatient setting  Hyperglycemia in the setting of Pre-Diabetes/IFG  -In the setting of IV steroid demargination -Hemoglobin A1c to rule out diabetes -If blood sugars remain consistently elevated will place on sensitive NovoLog sliding scale insulin AC and at bedtime -CBG's ranging from 157-160  Leukocytosis, improved -In the setting of IV steroid demargination -WBC went from 10.2 -> 10.9 -> 9.0 -> 9.9 -Continue to monitor for signs and symptoms of infection -Continued with IV Levaquin as above -Repeat CBC in the a.m.  DVT prophylaxis: Enoxaparin 40 mg sq q24h Code Status: FULL CODE Family Communication: No family present at bedside Disposition Plan: Transfer to [facility] with Telemetry   Consultants:   None   Procedures:  None  Antimicrobials:  Anti-infectives (From admission, onward)   Start     Dose/Rate Route Frequency Ordered Stop   05/09/18 2200  levofloxacin (LEVAQUIN) IVPB 500 mg     500 mg 100 mL/hr over 60 Minutes Intravenous Every 24 hours 05/09/18 0211 05/12/18 2200   05/08/18 2215  levofloxacin (LEVAQUIN) IVPB 500 mg     500 mg 100 mL/hr over 60 Minutes Intravenous  Once 05/08/18 2214 05/08/18 2343     Subjective: Seen and examined at bedside and states that she is doing very breathing a little bit easier.  Still having some expiratory wheezing.  Desaturated significantly yesterday and only wears home O2 as needed.  No nausea or vomiting.  No other concerns or complaints at this time.  Objective: Vitals:   05/14/18 0156 05/14/18 0449 05/14/18 0834 05/14/18 1335  BP:  137/71  136/71  Pulse:  79  87  Resp:  18  18  Temp:  97.9 F (36.6 C)  98.4 F (36.9 C)  TempSrc:  Oral  Oral  SpO2: 99% 96% 91% 95%  Weight:      Height:        Intake/Output Summary  (Last 24 hours) at 05/14/2018 1719 Last data filed at 05/13/2018 1800 Gross per 24 hour  Intake 120 ml  Output -  Net 120 ml   Filed Weights   05/08/18 2035 05/09/18 0346  Weight: 47.6 kg 48.5 kg   Examination: Physical Exam:  Constitutional: Thin Caucasian female currently no acute distress appears calm but slightly uncomfortable Eyes: External ears and nose appear normal.  Grossly normal hearing ENMT: Supple with no JVD Neck: Diminished to auscultation bilaterally with some wheezing that is improved but still there.  Has coarse breath sounds still..  Has a normal respiratory effort and is not as tachypneic but was wearing supplemental oxygen via nasal cannula Respiratory: Slightly tachycardic but regular rate and rhythm.  No appreciable lower extremity edema Cardiovascular: Soft, nontender, nondistended.  Bowel sounds present all 4 quadrants Abdomen: Deferred GU: No contractures or cyanosis.  No joint deformity noted Musculoskeletal: Skin is warm and dry no appreciable rashes or lesions limited skin evaluation Neurologic: Cranial nerves II through XII grossly intact no appreciable focal deficits Psychiatric: Normal judgment and insight.  Patient is awake alert and oriented x3.  Slightly anxious today  Data Reviewed: I have personally reviewed following labs and imaging studies  CBC: Recent Labs  Lab 05/08/18 2105 05/10/18 0348 05/12/18 0756 05/13/18 0337 05/14/18 0433  WBC 7.5 10.2 10.9* 9.0 9.9  NEUTROABS  --   --  9.7* 8.3* 9.2*  HGB 14.6 13.3 14.2 13.5 14.0  HCT 45.2 42.0 45.4 43.1 45.7  MCV 90.8 91.9 92.3 94.7 92.7  PLT 240 228 245 219 166   Basic Metabolic Panel: Recent Labs  Lab 05/09/18 0557 05/10/18 0348 05/12/18 0756 05/13/18 0337 05/14/18 0433  NA 133* 136 139 141 141  K 4.0 3.5 4.2 3.6 4.0  CL 93* 97* 96* 102 98  CO2 31 32 38* 34* 35*  GLUCOSE 189* 179* 154* 185* 186*  BUN _0 31* 37*  CREATININE 0.38* 0.37* 0.45 0.40* 0.47  CALCIUM 8.7*  8.5* 8.8* 8.5* 8.6*  MG  --   --  2.2 2.2 2.1  PHOS  --   --  2.9 3.1 3.7   GFR: Estimated Creatinine Clearance: 48.7 mL/min (by C-G formula based on SCr of 0.47 mg/dL). Liver Function Tests: Recent Labs  Lab 05/12/18 0756 05/13/18 0337 05/14/18 0433  AST 16 14* 15  ALT _1 ALKPHOS 36* 34* 33*  BILITOT 0.4 0.4 0.6  PROT 5.6* 5.3* 5.2*  ALBUMIN 3.2* 2.9* 2.9*   No results for input(s): LIPASE, AMYLASE in the last 168 hours. No results for input(s): AMMONIA in the last 168 hours. Coagulation Profile: No results for input(s): INR, PROTIME in the last 168 hours. Cardiac Enzymes: No results for input(s): CKTOTAL, CKMB, CKMBINDEX, TROPONINI in the last 168 hours. BNP (last 3 results) No results for input(s): PROBNP in the last 8760 hours. HbA1C: Recent Labs    05/13/18 0337  HGBA1C 6.1*   CBG: Recent Labs  Lab 05/10/18 0833 05/11/18 0419 05/12/18 0730 05/13/18 0758 05/14/18 0732  GLUCAP 197* 209* 155* 160* 157*   Lipid Profile: No results for input(s): CHOL, HDL, LDLCALC, TRIG, CHOLHDL, LDLDIRECT in the last 72 hours. Thyroid Function Tests: No results for input(s): TSH, T4TOTAL, FREET4, T3FREE, THYROIDAB in the last 72 hours. Anemia Panel: No results for input(s): VITAMINB12, FOLATE, FERRITIN, TIBC, IRON, RETICCTPCT in the last 72 hours. Sepsis Labs: No results for input(s): PROCALCITON, LATICACIDVEN in the last 168 hours.  Recent Results (from the past 240 hour(s))  MRSA PCR Screening     Status: None   Collection Time: 05/09/18  3:41 AM  Result Value Ref Range Status   MRSA by PCR NEGATIVE NEGATIVE Final    Comment:        The GeneXpert MRSA Assay (FDA approved for NASAL specimens only), is one component of a comprehensive MRSA colonization surveillance program. It is not intended to diagnose MRSA infection nor to guide or monitor treatment for MRSA infections. Performed at [facility], [place] [address], [place], [place]  [address]     Radiology Studies: Dg Chest Port 1 View  Result Date: 05/14/2018 CLINICAL DATA:  Shortness of breath. EXAM: PORTABLE CHEST 1 VIEW COMPARISON:  05/13/2018.  09/24/2016.  09/14/2015.  06/15/2015. FINDINGS: Mediastinum and hilar structures are stable. Heart size stable. Mild peripheral left base atelectasis/infiltrate. Pulmonary infarct cannot be excluded. Bullous COPD. Chronic interstitial changes. Right upper lobe pleural-parenchymal thickening again noted consistent scarring. Stable basal pleural thickening consistent with scarring. No pneumothorax. IMPRESSION: 1. Mild left base atelectasis/infiltrate. Pulmonary infarct cannot be excluded. 2. Bullous COPD. Stable chronic interstitial changes and right upper lobe pleural-parenchymal thickening consistent with scarring. Stable bibasal pleural thickening consistent scarring. Electronically Signed   By: Marcello Moores  Register   On: 05/14/2018 07:01   Dg Chest Port 1 View  Result Date: 05/13/2018 CLINICAL  DATA:  72 year old female with suspected COPD exacerbation earlier this month but failed to improve on medication. Acute on chronic respiratory failure. EXAM: PORTABLE CHEST 1 VIEW COMPARISON:  05/11/2018 and earlier. FINDINGS: Portable AP semi upright view at 0426 hours. Lower lung volumes. Stable cardiac size and mediastinal contours. Evidence of emphysema. No pneumothorax, definite pleural effusion, or confluent pulmonary opacity. Stable cardiac size and mediastinal contours. Visualized tracheal air column is within normal limits. Paucity bowel gas in the upper abdomen. IMPRESSION: Chronic lung disease with lower lung volumes today but no definite acute cardiopulmonary abnormality. Electronically Signed   By: Genevie Ann M.D.   On: 05/13/2018 07:28   Scheduled Meds: . arformoterol  15 mcg Nebulization BID  . benzonatate  200 mg Oral TID  . budesonide (PULMICORT) nebulizer solution  0.25 mg Nebulization BID  . buPROPion  150 mg Oral Daily  .  cholecalciferol  1,000 Units Oral Daily  . diltiazem  360 mg Oral Daily  . enoxaparin (LOVENOX) injection  40 mg Subcutaneous Q24H  . folic acid  1 mg Oral Daily  . guaiFENesin  1,200 mg Oral BID  . losartan  100 mg Oral Daily  . mouth rinse  15 mL Mouth Rinse BID  . methylPREDNISolone (SOLU-MEDROL) injection  60 mg Intravenous Q12H  . multivitamin with minerals  1 tablet Oral Daily  . pantoprazole  40 mg Oral Daily  . sodium chloride flush  3 mL Intravenous Q12H  . sodium chloride HYPERTONIC  4 mL Nebulization Daily  . thiamine  100 mg Oral Daily  . umeclidinium bromide  1 puff Inhalation Daily   Continuous Infusions:   LOS: 4 days   Kerney Elbe, DO [facility] PAGER is on [place]  If 7PM-7AM, please contact night-coverage [email or web address] Password [facility] 05/14/2018, 5:19 PM

## 2018-05-15 ENCOUNTER — Inpatient Hospital Stay (HOSPITAL_COMMUNITY): Payer: Medicare Other

## 2018-05-15 LAB — CBC WITH DIFFERENTIAL/PLATELET
Abs Immature Granulocytes: 0.09 10*3/uL — ABNORMAL HIGH (ref 0.00–0.07)
Basophils Absolute: 0 10*3/uL (ref 0.0–0.1)
Basophils Relative: 0 %
Eosinophils Absolute: 0 10*3/uL (ref 0.0–0.5)
Eosinophils Relative: 0 %
HCT: 45.4 % (ref 36.0–46.0)
Hemoglobin: 14.2 g/dL (ref 12.0–15.0)
IMMATURE GRANULOCYTES: 1 %
LYMPHS PCT: 2 %
Lymphs Abs: 0.3 10*3/uL — ABNORMAL LOW (ref 0.7–4.0)
MCH: 28.9 pg (ref 26.0–34.0)
MCHC: 31.3 g/dL (ref 30.0–36.0)
MCV: 92.5 fL (ref 80.0–100.0)
Monocytes Absolute: 0.6 10*3/uL (ref 0.1–1.0)
Monocytes Relative: 5 %
Neutro Abs: 11.9 10*3/uL — ABNORMAL HIGH (ref 1.7–7.7)
Neutrophils Relative %: 92 %
Platelets: 267 10*3/uL (ref 150–400)
RBC: 4.91 MIL/uL (ref 3.87–5.11)
RDW: 14.1 % (ref 11.5–15.5)
WBC: 12.9 10*3/uL — ABNORMAL HIGH (ref 4.0–10.5)
nRBC: 0 % (ref 0.0–0.2)

## 2018-05-15 LAB — COMPREHENSIVE METABOLIC PANEL
ALT: 31 U/L (ref 0–44)
ANION GAP: 9 (ref 5–15)
AST: 30 U/L (ref 15–41)
Albumin: 3 g/dL — ABNORMAL LOW (ref 3.5–5.0)
Alkaline Phosphatase: 39 U/L (ref 38–126)
BUN: 35 mg/dL — ABNORMAL HIGH (ref 8–23)
CO2: 35 mmol/L — ABNORMAL HIGH (ref 22–32)
Calcium: 8.5 mg/dL — ABNORMAL LOW (ref 8.9–10.3)
Chloride: 97 mmol/L — ABNORMAL LOW (ref 98–111)
Creatinine, Ser: 0.49 mg/dL (ref 0.44–1.00)
GFR calc Af Amer: >60 mL/min (ref >60)
GFR calc non Af Amer: >60 mL/min (ref >60)
Glucose, Bld: 185 mg/dL — ABNORMAL HIGH (ref 70–99)
Potassium: 4.3 mmol/L (ref 3.5–5.1)
SODIUM: 141 mmol/L (ref 135–145)
TOTAL PROTEIN: 5.2 g/dL — AB (ref 6.5–8.1)
Total Bilirubin: 1.6 mg/dL — ABNORMAL HIGH (ref 0.3–1.2)

## 2018-05-15 LAB — PHOSPHORUS: Phosphorus: 3.4 mg/dL (ref 2.5–4.6)

## 2018-05-15 LAB — MAGNESIUM: Magnesium: 2.2 mg/dL (ref 1.7–2.4)

## 2018-05-15 LAB — GLUCOSE, CAPILLARY: GLUCOSE-CAPILLARY: 133 mg/dL — AB (ref 70–99)

## 2018-05-15 NOTE — Progress Notes (Signed)
Patient Saturations on Room Air at Rest = 89%  Patient Saturations on [employer] while Ambulating = 87%  Patient Saturations on 3 Liters of oxygen while Ambulating = 92%  Please briefly explain why patient needs home oxygen:

## 2018-05-15 NOTE — Evaluation (Signed)
Physical Therapy Evaluation Patient Details Name: [name] MRN: [id number] DOB: [birth year] Today's Date: 05/15/2018   History of Present Illness  [name] is a 72 y.o. female with medical history significant for COPD with chronic hypoxic respiratory failure, hypertension, and chronic bilateral leg swelling and ulcerations, admitted with  shortness of breath, acute on chronic respiratory failure with COPD exacerbation  Clinical Impression  Pt admitted with above diagnosis. Pt currently with functional limitations due to the deficits listed below (see PT Problem List). Pt maintained sats >91% on 3L, will follow in acute setting; encouraged pt to perform ankle pumps, quad sets, LAQs during day and continue getting up to [facility]; will follow in acute setting, do not think she will need post acute PT f/u; Pt will benefit from skilled PT to increase their independence and safety with mobility to allow discharge to the venue listed below.       Follow Up Recommendations No PT follow up    Equipment Recommendations  None recommended by PT    Recommendations for Other Services       Precautions / Restrictions Precautions Precautions: Fall Precaution Comments: O2 dependent  Restrictions Weight Bearing Restrictions: No      Mobility  Bed Mobility Overal bed mobility: Modified Independent                Transfers Overall transfer level: Needs assistance Equipment used: Rolling walker (2 wheeled) Transfers: Sit to/from Stand Sit to Stand: Min guard         General transfer comment: for safety and line management  Ambulation/Gait Ambulation/Gait assistance: Min guard;Min assist Gait Distance (Feet): 65 Feet Assistive device: None Gait Pattern/deviations: Step-through pattern;Decreased stride length;Trunk flexed;Drifts right/left     General Gait Details: pt with unsteady gait, delayed balance reactions requiring min assist for safety; one standing rest d/t DOE;  SpO2=91-94% on 3L O2   Stairs            Wheelchair Mobility    Modified Rankin (Stroke Patients Only)       Balance Overall balance assessment: Needs assistance Sitting-balance support: Feet supported;No upper extremity supported Sitting balance-Leahy Scale: Good       Standing balance-Leahy Scale: Fair Standing balance comment: unsteady with wt shifting and no UE support                             Pertinent Vitals/Pain Pain Assessment: No/denies pain    Home Living Family/patient expects to be discharged to:: Private residence Living Arrangements: Alone   Type of Home: House       Home Layout: One level Home Equipment: Environmental consultant - 2 wheels;Cane - single point;Bedside commode Additional Comments: DME in storage; home O2 at night at 2L    Prior Function Level of Independence: Independent               Hand Dominance        Extremity/Trunk Assessment   Upper Extremity Assessment Upper Extremity Assessment: Overall WFL for tasks assessed    Lower Extremity Assessment Lower Extremity Assessment: Overall WFL for tasks assessed(rapid muscle fatigue)       Communication   Communication: No difficulties  Cognition Arousal/Alertness: Awake/alert Behavior During Therapy: WFL for tasks assessed/performed Overall Cognitive Status: Within Functional Limits for tasks assessed  General Comments      Exercises     Assessment/Plan    PT Assessment Patient needs continued PT services  PT Problem List Decreased activity tolerance;Decreased knowledge of use of DME;Decreased mobility;Decreased balance;Cardiopulmonary status limiting activity       PT Treatment Interventions DME instruction;Gait training;Functional mobility training;Therapeutic activities;Therapeutic exercise;Patient/family education;Balance training;Stair training    PT Goals (Current goals can be found in the Care  Plan section)  Acute Rehab PT Goals PT Goal Formulation: With patient Time For Goal Achievement: 05/29/18 Potential to Achieve Goals: Good    Frequency Min 3X/week   Barriers to discharge        Co-evaluation               AM-PAC PT "6 Clicks" Mobility  Outcome Measure Help needed turning from your back to your side while in a flat bed without using bedrails?: None Help needed moving from lying on your back to sitting on the side of a flat bed without using bedrails?: None Help needed moving to and from a bed to a chair (including a wheelchair)?: None Help needed standing up from a chair using your arms (e.g., wheelchair or bedside chair)?: A Little Help needed to walk in hospital room?: A Little Help needed climbing 3-5 steps with a railing? : A Little 6 Click Score: 21    End of Session Equipment Utilized During Treatment: Gait belt Activity Tolerance: Patient tolerated treatment well;Patient limited by fatigue Patient left: with call bell/phone within reach;in chair   PT Visit Diagnosis: Unsteadiness on feet (R26.81)    Time: 1962-2297 PT Time Calculation (min) (ACUTE ONLY): 18 min   Charges:   PT Evaluation $PT Eval Low Complexity: 1 Low          Kenyon Ana, PT  Pager: [phone] Acute Rehab Dept [facility]): [phone]   05/15/2018   [facility] 05/15/2018, 12:33 PM

## 2018-05-15 NOTE — Progress Notes (Addendum)
PROGRESS NOTE    [name]  IEP:[id number] DOB: October 09, 1945 DOA: 05/08/2018 PCP: Christain Sacramento, MD   Brief Narrative:  HPI on 05/09/2018 by Dr. Mitzi Hansen [name] is a 72 y.o. female with medical history significant for COPD with chronic hypoxic respiratory failure, hypertension, and chronic bilateral leg swelling and ulcerations, now presenting to the emergency department for evaluation of shortness of breath and productive cough.  Patient was seen in her pulmonology clinic on 05/03/2018 with productive cough and increased dyspnea, was diagnosed with acute exacerbation in COPD and started on prednisone and azithromycin.  She failed to improve, was seen back on 05/06/2018 and reports that her prednisone course was extended.  Unfortunately, she still has not had any significant improvement and may actually be worsening.  She has been dyspneic at rest for the past couple days and has had difficulty sleeping due to this.  She denies fevers, chills, or chest pain.  Reports that her chronic bilateral lower extremity swelling is not as bad as usual.  Interim history Admitted for COPD exacerbation and respiratory failure. Improving very slowly.   Assessment & Plan   Acute on chronic hypoxic respiratory failure with COPD Exacerbation -Presented with worsening shortness of breath, cough despite being treated with prednisone and azithromycin since 05/03/2018 -Afebrile on admission -Patient did require BiPAP when admitted -Was treated with albuterol nebulizers x3, prednisone as well as Levaquin in the ED -Pending sputum culture if able -Continue nebulizer treatments, Solu-Medrol (weaning), antitussives, mucinex  -Completed course of Levaquin  -Transitioned to nasal cannula, 2L -will walk patient to see if she will require home oxygen during the day -CXR today, showing improvment  Hyponatremia -Sodium 130 on admission, up to 141 -IVF discontinued   Essential  hypertension -Continue diltiazem, losartan  Alcohol use -She appears to be stable and not in withdrawal -Continue CIWA protocol, folic acid, thiamine, multivitamin   Leukocytosis  -likely due to steroids -was treated with levaquin  Hyperglycemia -no history of diabetes  -hemoglobin A1c 6.1  DVT Prophylaxis  lovenox  Code Status: Full  Family Communication: None at bedside  Disposition Plan: Admitted. Appears to be improving slowly. Suspect home in 1-2 days  Consultants None  Procedures  None  Antibiotics   Anti-infectives (From admission, onward)   Start     Dose/Rate Route Frequency Ordered Stop   05/09/18 2200  levofloxacin (LEVAQUIN) IVPB 500 mg     500 mg 100 mL/hr over 60 Minutes Intravenous Every 24 hours 05/09/18 0211 05/12/18 2200   05/08/18 2215  levofloxacin (LEVAQUIN) IVPB 500 mg     500 mg 100 mL/hr over 60 Minutes Intravenous  Once 05/08/18 2214 05/08/18 2343      Subjective:   [name] seen and examined today.  Feels breathing has improved, but not back to baseline. Denies chest pain, shortness of breath, N/V/D/C, dizziness, headache.  Objective:   Vitals:   05/14/18 2018 05/14/18 2053 05/15/18 0432 05/15/18 0757  BP:  (!) 148/68 (!) 145/77   Pulse:   73   Resp:   18   Temp:   98.1 F (36.7 C)   TempSrc:   Oral   SpO2: 97%  93% 95%  Weight:      Height:        Intake/Output Summary (Last 24 hours) at 05/15/2018 1214 Last data filed at 05/15/2018 1000 Gross per 24 hour  Intake 603 ml  Output -  Net 603 ml   Filed Weights   05/08/18  2035 05/09/18 0346  Weight: 47.6 kg 48.5 kg   Exam  General: Well developed, chronically ill appearing, NAD  HEENT: NCAT, mucous membranes moist.   Neck: Supple  Cardiovascular: S1 S2 auscultated, no murmur, RRR  Respiratory: Diminished breath sounds, expiratory wheezing   Abdomen: Soft, nontender, nondistended, + bowel sounds  Extremities: warm dry without cyanosis clubbing or  edema  Neuro: AAOx3, nonfocal  Psych: Appropriate mood and affect, pleasant   Data Reviewed: I have personally reviewed following labs and imaging studies  CBC: Recent Labs  Lab 05/10/18 0348 05/12/18 0756 05/13/18 0337 05/14/18 0433 05/15/18 0502  WBC 10.2 10.9* 9.0 9.9 12.9*  NEUTROABS  --  9.7* 8.3* 9.2* 11.9*  HGB 13.3 14.2 13.5 14.0 14.2  HCT 42.0 45.4 43.1 45.7 45.4  MCV 91.9 92.3 94.7 92.7 92.5  PLT 228 245 219 239 294   Basic Metabolic Panel: Recent Labs  Lab 05/10/18 0348 05/12/18 0756 05/13/18 0337 05/14/18 0433 05/15/18 0502  NA 136 139 141 141 141  K 3.5 4.2 3.6 4.0 4.3  CL 97* 96* 102 98 97*  CO2 32 38* 34* 35* 35*  GLUCOSE 179* 154* 185* 186* 185*  BUN 16 23 31* 37* 35*  CREATININE 0.37* 0.45 0.40* 0.47 0.49  CALCIUM 8.5* 8.8* 8.5* 8.6* 8.5*  MG  --  2.2 2.2 2.1 2.2  PHOS  --  2.9 3.1 3.7 3.4   GFR: Estimated Creatinine Clearance: 48.7 mL/min (by C-G formula based on SCr of 0.49 mg/dL). Liver Function Tests: Recent Labs  Lab 05/12/18 0756 05/13/18 0337 05/14/18 0433 05/15/18 0502  AST 16 14* 15 30  ALT _0 ALKPHOS 36* 34* 33* 39  BILITOT 0.4 0.4 0.6 1.6*  PROT 5.6* 5.3* 5.2* 5.2*  ALBUMIN 3.2* 2.9* 2.9* 3.0*   No results for input(s): LIPASE, AMYLASE in the last 168 hours. No results for input(s): AMMONIA in the last 168 hours. Coagulation Profile: No results for input(s): INR, PROTIME in the last 168 hours. Cardiac Enzymes: No results for input(s): CKTOTAL, CKMB, CKMBINDEX, TROPONINI in the last 168 hours. BNP (last 3 results) No results for input(s): PROBNP in the last 8760 hours. HbA1C: Recent Labs    05/13/18 0337  HGBA1C 6.1*   CBG: Recent Labs  Lab 05/11/18 0419 05/12/18 0730 05/13/18 0758 05/14/18 0732 05/15/18 0738  GLUCAP 209* 155* 160* 157* 133*   Lipid Profile: No results for input(s): CHOL, HDL, LDLCALC, TRIG, CHOLHDL, LDLDIRECT in the last 72 hours. Thyroid Function Tests: No results for input(s):  TSH, T4TOTAL, FREET4, T3FREE, THYROIDAB in the last 72 hours. Anemia Panel: No results for input(s): VITAMINB12, FOLATE, FERRITIN, TIBC, IRON, RETICCTPCT in the last 72 hours. Urine analysis:    Component Value Date/Time   COLORURINE STRAW (A) 05/09/2018 0646   APPEARANCEUR CLEAR 05/09/2018 0646   LABSPEC 1.005 05/09/2018 0646   PHURINE 7.0 05/09/2018 0646   GLUCOSEU 150 (A) 05/09/2018 0646   HGBUR NEGATIVE 05/09/2018 0646   BILIRUBINUR NEGATIVE 05/09/2018 0646   KETONESUR 5 (A) 05/09/2018 0646   PROTEINUR NEGATIVE 05/09/2018 0646   NITRITE NEGATIVE 05/09/2018 0646   LEUKOCYTESUR NEGATIVE 05/09/2018 0646   Sepsis Labs: _1 (procalcitonin:4,lacticidven:4)  ) Recent Results (from the past 240 hour(s))  MRSA PCR Screening     Status: None   Collection Time: 05/09/18  3:41 AM  Result Value Ref Range Status   MRSA by PCR NEGATIVE NEGATIVE Final    Comment:        The GeneXpert MRSA  Assay (FDA approved for NASAL specimens only), is one component of a comprehensive MRSA colonization surveillance program. It is not intended to diagnose MRSA infection nor to guide or monitor treatment for MRSA infections. Performed at [facility], [place] [address] [place], [place] [address]       Radiology Studies: Dg Chest Port 1 View  Result Date: 05/15/2018 CLINICAL DATA:  Initial evaluation for acute shortness of breath. EXAM: PORTABLE CHEST 1 VIEW COMPARISON:  Prior radiograph from 05/14/2018 FINDINGS: Mild cardiomegaly, stable. Mediastinal silhouette within normal limits. Aortic atherosclerosis. Lungs are hyperinflated with underlying emphysematous changes. Previously seen left basilar opacity is improved, with minimal residual linear densities overlying the left hemidiaphragm, likely atelectasis. Additional streaky right basilar opacities favored to reflect atelectatic changes as well. No new focal infiltrates. Possible trace right pleural effusion. No pulmonary  edema. No pneumothorax. No acute osseous abnormality. Osteopenia. IMPRESSION: 1. Interval improvement in previously seen left basilar opacity, with minimal residual left basilar atelectasis. 2. Possible trace right pleural effusion. 3. Emphysema. Electronically Signed   By: Jeannine Boga M.D.   On: 05/15/2018 05:56   Dg Chest Port 1 View  Result Date: 05/14/2018 CLINICAL DATA:  Shortness of breath. EXAM: PORTABLE CHEST 1 VIEW COMPARISON:  05/13/2018.  09/24/2016.  09/14/2015.  06/15/2015. FINDINGS: Mediastinum and hilar structures are stable. Heart size stable. Mild peripheral left base atelectasis/infiltrate. Pulmonary infarct cannot be excluded. Bullous COPD. Chronic interstitial changes. Right upper lobe pleural-parenchymal thickening again noted consistent scarring. Stable basal pleural thickening consistent with scarring. No pneumothorax. IMPRESSION: 1. Mild left base atelectasis/infiltrate. Pulmonary infarct cannot be excluded. 2. Bullous COPD. Stable chronic interstitial changes and right upper lobe pleural-parenchymal thickening consistent with scarring. Stable bibasal pleural thickening consistent scarring. Electronically Signed   By: Marcello Moores  Register   On: 05/14/2018 07:01     Scheduled Meds: . arformoterol  15 mcg Nebulization BID  . benzonatate  200 mg Oral TID  . budesonide (PULMICORT) nebulizer solution  0.25 mg Nebulization BID  . buPROPion  150 mg Oral Daily  . cholecalciferol  1,000 Units Oral Daily  . diltiazem  360 mg Oral Daily  . enoxaparin (LOVENOX) injection  40 mg Subcutaneous Q24H  . folic acid  1 mg Oral Daily  . guaiFENesin  1,200 mg Oral BID  . losartan  100 mg Oral Daily  . mouth rinse  15 mL Mouth Rinse BID  . methylPREDNISolone (SOLU-MEDROL) injection  60 mg Intravenous Q12H  . multivitamin with minerals  1 tablet Oral Daily  . pantoprazole  40 mg Oral Daily  . sodium chloride flush  3 mL Intravenous Q12H  . sodium chloride HYPERTONIC  4 mL Nebulization  Daily  . thiamine  100 mg Oral Daily  . umeclidinium bromide  1 puff Inhalation Daily   Continuous Infusions:    LOS: 5 days   Time Spent in minutes   30 minutes  Nejla Reasor D.O. on 05/15/2018 at 12:14 PM  Between 7am to 7pm - Please see pager noted on [email or web address]  After 7pm go to [email or web address]  And look for the night coverage person covering for me after hours  [facility] Office  [phone]

## 2018-05-16 LAB — CREATININE, SERUM
Creatinine, Ser: 0.43 mg/dL — ABNORMAL LOW (ref 0.44–1.00)
GFR calc Af Amer: >60 mL/min (ref >60)
GFR calc non Af Amer: >60 mL/min (ref >60)

## 2018-05-16 LAB — GLUCOSE, CAPILLARY: Glucose-Capillary: 165 mg/dL — ABNORMAL HIGH (ref 70–99)

## 2018-05-16 MED ORDER — METHYLPREDNISOLONE SODIUM SUCC 125 MG IJ SOLR: 60.0000 mg | Freq: Every day | INTRAMUSCULAR | Status: DC

## 2018-05-16 NOTE — Progress Notes (Signed)
PROGRESS NOTE    [name]  TML:[id number] DOB: 12/15/1945 DOA: 05/08/2018 PCP: Christain Sacramento, MD   Brief Narrative:  HPI on 05/09/2018 by Dr. Mitzi Hansen [name] is a 72 y.o. female with medical history significant for COPD with chronic hypoxic respiratory failure, hypertension, and chronic bilateral leg swelling and ulcerations, now presenting to the emergency department for evaluation of shortness of breath and productive cough.  Patient was seen in her pulmonology clinic on 05/03/2018 with productive cough and increased dyspnea, was diagnosed with acute exacerbation in COPD and started on prednisone and azithromycin.  She failed to improve, was seen back on 05/06/2018 and reports that her prednisone course was extended.  Unfortunately, she still has not had any significant improvement and may actually be worsening.  She has been dyspneic at rest for the past couple days and has had difficulty sleeping due to this.  She denies fevers, chills, or chest pain.  Reports that her chronic bilateral lower extremity swelling is not as bad as usual.  Interim history Admitted for COPD exacerbation and respiratory failure. Improving very slowly.   Assessment & Plan   Acute on chronic hypoxic respiratory failure with COPD Exacerbation -Presented with worsening shortness of breath, cough despite being treated with prednisone and azithromycin since 05/03/2018 -Afebrile on admission -Patient did require BiPAP when admitted -Was treated with albuterol nebulizers x3, prednisone as well as Levaquin in the ED -Pending sputum culture if able -Continue nebulizer treatments, Solu-Medrol (weaning), antitussives, mucinex  -Completed course of Levaquin  -Transitioned to nasal cannula, 3L -CXR 11/30, showing improvement -with ambulation, patient oxygen saturations dropped to 87% on room air  Hyponatremia -Sodium 130 on admission, up to 141 -IVF discontinued   Essential  hypertension -Continue diltiazem, losartan  Alcohol use -She appears to be stable and not in withdrawal -Continue CIWA protocol, folic acid, thiamine, multivitamin   Leukocytosis  -likely due to steroids -was treated with levaquin  Hyperglycemia -no history of diabetes  -hemoglobin A1c 6.1  Deconditioning -secondary to respiratory status/illness -PT evaluated patient, no further needs -OT recommended HH  DVT Prophylaxis  lovenox  Code Status: Full  Family Communication: None at bedside  Disposition Plan: Admitted. Appears to be improving slowly. Suspect home in 1-2 days  Consultants None  Procedures  None  Antibiotics   Anti-infectives (From admission, onward)   Start     Dose/Rate Route Frequency Ordered Stop   05/09/18 2200  levofloxacin (LEVAQUIN) IVPB 500 mg     500 mg 100 mL/hr over 60 Minutes Intravenous Every 24 hours 05/09/18 0211 05/12/18 2200   05/08/18 2215  levofloxacin (LEVAQUIN) IVPB 500 mg     500 mg 100 mL/hr over 60 Minutes Intravenous  Once 05/08/18 2214 05/08/18 2343      Subjective:   [name] seen and examined today.  Feels breathing has improved however not back to baseline.  Also states that she feels that she gets very winded easily when moving around the room.  Denies current chest pain, abdominal pain, nausea or vomiting, diarrhea or constipation, dizziness or headache.  Does admit to feeling very weak.    Objective:   Vitals:   05/15/18 1414 05/15/18 2217 05/16/18 0528 05/16/18 0942  BP: 132/69  (!) 144/80   Pulse: 77  65   Resp: 20  18   Temp: 98.8 F (37.1 C)  99 F (37.2 C)   TempSrc: Oral  Oral   SpO2: 94% 92% 94% 98%  Weight:  Height:        Intake/Output Summary (Last 24 hours) at 05/16/2018 1038 Last data filed at 05/15/2018 1800 Gross per 24 hour  Intake 720 ml  Output -  Net 720 ml   Filed Weights   05/08/18 2035 05/09/18 0346  Weight: 47.6 kg 48.5 kg   Exam  General: Well developed, chronically  ill-appearing, NAD  HEENT: NCAT, mucous membranes moist.   Cardiovascular: S1 S2 auscultated, RRR, no murmur  Respiratory: Diminished breath sounds, mild expiratory wheezing  Abdomen: Soft, nontender, nondistended, + bowel sounds  Extremities: warm dry without cyanosis clubbing or edema  Neuro: AAOx3, nonfocal  Psych: Normal affect and demeanor with intact judgement and insight, pleasant  Data Reviewed: I have personally reviewed following labs and imaging studies  CBC: Recent Labs  Lab 05/10/18 0348 05/12/18 0756 05/13/18 0337 05/14/18 0433 05/15/18 0502  WBC 10.2 10.9* 9.0 9.9 12.9*  NEUTROABS  --  9.7* 8.3* 9.2* 11.9*  HGB 13.3 14.2 13.5 14.0 14.2  HCT 42.0 45.4 43.1 45.7 45.4  MCV 91.9 92.3 94.7 92.7 92.5  PLT 228 245 219 239 269   Basic Metabolic Panel: Recent Labs  Lab 05/10/18 0348 05/12/18 0756 05/13/18 0337 05/14/18 0433 05/15/18 0502 05/16/18 0418  NA 136 139 141 141 141  --   K 3.5 4.2 3.6 4.0 4.3  --   CL 97* 96* 102 98 97*  --   CO2 32 38* 34* 35* 35*  --   GLUCOSE 179* 154* 185* 186* 185*  --   BUN 16 23 31* 37* 35*  --   CREATININE 0.37* 0.45 0.40* 0.47 0.49 0.43*  CALCIUM 8.5* 8.8* 8.5* 8.6* 8.5*  --   MG  --  2.2 2.2 2.1 2.2  --   PHOS  --  2.9 3.1 3.7 3.4  --    GFR: Estimated Creatinine Clearance: 48.7 mL/min (A) (by C-G formula based on SCr of 0.43 mg/dL (L)). Liver Function Tests: Recent Labs  Lab 05/12/18 0756 05/13/18 0337 05/14/18 0433 05/15/18 0502  AST 16 14* 15 30  ALT _0 ALKPHOS 36* 34* 33* 39  BILITOT 0.4 0.4 0.6 1.6*  PROT 5.6* 5.3* 5.2* 5.2*  ALBUMIN 3.2* 2.9* 2.9* 3.0*   No results for input(s): LIPASE, AMYLASE in the last 168 hours. No results for input(s): AMMONIA in the last 168 hours. Coagulation Profile: No results for input(s): INR, PROTIME in the last 168 hours. Cardiac Enzymes: No results for input(s): CKTOTAL, CKMB, CKMBINDEX, TROPONINI in the last 168 hours. BNP (last 3 results) No results  for input(s): PROBNP in the last 8760 hours. HbA1C: No results for input(s): HGBA1C in the last 72 hours. CBG: Recent Labs  Lab 05/12/18 0730 05/13/18 0758 05/14/18 0732 05/15/18 0738 05/16/18 0757  GLUCAP 155* 160* 157* 133* 165*   Lipid Profile: No results for input(s): CHOL, HDL, LDLCALC, TRIG, CHOLHDL, LDLDIRECT in the last 72 hours. Thyroid Function Tests: No results for input(s): TSH, T4TOTAL, FREET4, T3FREE, THYROIDAB in the last 72 hours. Anemia Panel: No results for input(s): VITAMINB12, FOLATE, FERRITIN, TIBC, IRON, RETICCTPCT in the last 72 hours. Urine analysis:    Component Value Date/Time   COLORURINE STRAW (A) 05/09/2018 0646   APPEARANCEUR CLEAR 05/09/2018 0646   LABSPEC 1.005 05/09/2018 0646   PHURINE 7.0 05/09/2018 0646   GLUCOSEU 150 (A) 05/09/2018 0646   HGBUR NEGATIVE 05/09/2018 0646   BILIRUBINUR NEGATIVE 05/09/2018 0646   KETONESUR 5 (A) 05/09/2018 0646   PROTEINUR NEGATIVE  05/09/2018 0646   NITRITE NEGATIVE 05/09/2018 0646   LEUKOCYTESUR NEGATIVE 05/09/2018 0646   Sepsis Labs: _0 (procalcitonin:4,lacticidven:4)  ) Recent Results (from the past 240 hour(s))  MRSA PCR Screening     Status: None   Collection Time: 05/09/18  3:41 AM  Result Value Ref Range Status   MRSA by PCR NEGATIVE NEGATIVE Final    Comment:        The GeneXpert MRSA Assay (FDA approved for NASAL specimens only), is one component of a comprehensive MRSA colonization surveillance program. It is not intended to diagnose MRSA infection nor to guide or monitor treatment for MRSA infections. Performed at [facility], [place] [address] [place], [place] [address]       Radiology Studies: Dg Chest Port 1 View  Result Date: 05/15/2018 CLINICAL DATA:  Initial evaluation for acute shortness of breath. EXAM: PORTABLE CHEST 1 VIEW COMPARISON:  Prior radiograph from 05/14/2018 FINDINGS: Mild cardiomegaly, stable. Mediastinal silhouette within normal  limits. Aortic atherosclerosis. Lungs are hyperinflated with underlying emphysematous changes. Previously seen left basilar opacity is improved, with minimal residual linear densities overlying the left hemidiaphragm, likely atelectasis. Additional streaky right basilar opacities favored to reflect atelectatic changes as well. No new focal infiltrates. Possible trace right pleural effusion. No pulmonary edema. No pneumothorax. No acute osseous abnormality. Osteopenia. IMPRESSION: 1. Interval improvement in previously seen left basilar opacity, with minimal residual left basilar atelectasis. 2. Possible trace right pleural effusion. 3. Emphysema. Electronically Signed   By: Jeannine Boga M.D.   On: 05/15/2018 05:56     Scheduled Meds: . arformoterol  15 mcg Nebulization BID  . benzonatate  200 mg Oral TID  . budesonide (PULMICORT) nebulizer solution  0.25 mg Nebulization BID  . buPROPion  150 mg Oral Daily  . cholecalciferol  1,000 Units Oral Daily  . diltiazem  360 mg Oral Daily  . enoxaparin (LOVENOX) injection  40 mg Subcutaneous Q24H  . folic acid  1 mg Oral Daily  . guaiFENesin  1,200 mg Oral BID  . losartan  100 mg Oral Daily  . mouth rinse  15 mL Mouth Rinse BID  . [START ON 05/17/2018] methylPREDNISolone (SOLU-MEDROL) injection  60 mg Intravenous Daily  . multivitamin with minerals  1 tablet Oral Daily  . pantoprazole  40 mg Oral Daily  . sodium chloride flush  3 mL Intravenous Q12H  . sodium chloride HYPERTONIC  4 mL Nebulization Daily  . thiamine  100 mg Oral Daily  . umeclidinium bromide  1 puff Inhalation Daily   Continuous Infusions:    LOS: 6 days   Time Spent in minutes   30 minutes  Oumar Marcott D.O. on 05/16/2018 at 10:38 AM  Between 7am to 7pm - Please see pager noted on [email or web address]  After 7pm go to [email or web address]  And look for the night coverage person covering for me after hours  [facility] Office  [phone]

## 2018-05-16 NOTE — Evaluation (Signed)
Occupational Therapy Evaluation Patient Details Name: TURQUOISE ESCH MRN: [id number] DOB: 1945-06-18 Today's Date: 05/16/2018    History of Present Illness NEVENA ROZENBERG is a 72 y.o. female with medical history significant for COPD with chronic hypoxic respiratory failure, hypertension, and chronic bilateral leg swelling and ulcerations, admitted with  shortness of breath, acute on chronic respiratory failure with COPD exacerbation   Clinical Impression   Pt was admitted for the above. She lives alone and is mod I with all adls/iadls at baseline (extra time).  Pt has limited support at home. Will follow in acute setting with mod I level goals. Recommend HHOT for IADLs, if another service is in the home as OT cannot stand alone    Follow Up Recommendations  Home health OT(if there is another service; OT cannot stand alone)    Equipment Recommendations  None recommended by OT    Recommendations for Other Services       Precautions / Restrictions Precautions Precautions: Fall Precaution Comments: O2 dependent  Restrictions Weight Bearing Restrictions: No      Mobility Bed Mobility Overal bed mobility: Modified Independent                Transfers   Equipment used: None   Sit to Stand: Min guard         General transfer comment: for safety; OT managed tank    Balance                                           ADL either performed or assessed with clinical judgement   ADL Overall ADL's : Needs assistance/impaired Eating/Feeding: Independent   Grooming: Oral care;Set up;Supervision/safety;Standing   Upper Body Bathing: Set up;Sitting   Lower Body Bathing: Supervison/ safety;Sit to/from stand   Upper Body Dressing : Set up;Sitting   Lower Body Dressing: Minimal assistance;Sit to/from stand   Toilet Transfer: Min guard;Ambulation;Comfort height toilet             General ADL Comments: performed adl in bathroom. Pt paces herself  and takes rest breaks     Vision         Perception     Praxis      Pertinent Vitals/Pain Pain Assessment: No/denies pain     Hand Dominance     Extremity/Trunk Assessment Upper Extremity Assessment Upper Extremity Assessment: Overall WFL for tasks assessed           Communication     Cognition Arousal/Alertness: Awake/alert Behavior During Therapy: WFL for tasks assessed/performed Overall Cognitive Status: Within Functional Limits for tasks assessed                                     General Comments  on 3 liters sats 91-92%. WOB increased    Exercises     Shoulder Instructions      Home Living Family/patient expects to be discharged to:: Private residence Living Arrangements: Alone                 Bathroom Shower/Tub: Occupational psychologist: Standard     Home Equipment: Environmental consultant - 2 wheels;Cane - single point;Bedside commode;Shower seat          Prior Functioning/Environment Level of Independence: Independent        Comments: has  been sponge bathing        OT Problem List: Decreased strength;Decreased activity tolerance;Cardiopulmonary status limiting activity      OT Treatment/Interventions: Self-care/ADL training;Energy conservation;DME and/or AE instruction;Patient/family education;Balance training;Therapeutic activities    OT Goals(Current goals can be found in the care plan section) Acute Rehab OT Goals Patient Stated Goal: return to independence and work (she is a Museum/gallery curator) OT Goal Formulation: With patient Time For Goal Achievement: 05/30/18 Potential to Achieve Goals: Good ADL Goals Pt Will Transfer to Toilet: with modified independence;regular height toilet Additional ADL Goal #1: pt will gather clothes and complete adl at mod I level Additional ADL Goal #2: pt will verbalize 3 energy conservation strategies  OT Frequency: Min 2X/week   Barriers to D/C:            Co-evaluation               AM-PAC OT "6 Clicks" Daily Activity     Outcome Measure Help from another person eating meals?: None Help from another person taking care of personal grooming?: A Little Help from another person toileting, which includes using toliet, bedpan, or urinal?: A Little Help from another person bathing (including washing, rinsing, drying)?: A Little Help from another person to put on and taking off regular upper body clothing?: A Little Help from another person to put on and taking off regular lower body clothing?: A Little 6 Click Score: 19   End of Session    Activity Tolerance: Patient tolerated treatment well Patient left: in chair;with call bell/phone within reach  OT Visit Diagnosis: Muscle weakness (generalized) (M62.81)                Time: 0827-0900 OT Time Calculation (min): 33 min Charges:  OT General Charges $OT Visit: 1 Visit OT Evaluation $OT Eval Low Complexity: 1 Low OT Treatments $Self Care/Home Management : 8-22 mins  Lesle Chris, OTR/L Acute Rehabilitation Services [phone] [facility] pager [phone] office 05/16/2018  Rebecka Oelkers 05/16/2018, 10:18 AM

## 2018-05-17 ENCOUNTER — Ambulatory Visit: Payer: Medicare Other | Admitting: Internal Medicine

## 2018-05-17 LAB — COMPREHENSIVE METABOLIC PANEL
ALT: 42 U/L (ref 0–44)
AST: 18 U/L (ref 15–41)
Albumin: 2.7 g/dL — ABNORMAL LOW (ref 3.5–5.0)
Alkaline Phosphatase: 33 U/L — ABNORMAL LOW (ref 38–126)
Anion gap: 7 (ref 5–15)
BUN: 31 mg/dL — ABNORMAL HIGH (ref 8–23)
CALCIUM: 8.3 mg/dL — AB (ref 8.9–10.3)
CO2: 33 mmol/L — ABNORMAL HIGH (ref 22–32)
Chloride: 104 mmol/L (ref 98–111)
Creatinine, Ser: 0.49 mg/dL (ref 0.44–1.00)
GFR calc Af Amer: >60 mL/min (ref >60)
Glucose, Bld: 138 mg/dL — ABNORMAL HIGH (ref 70–99)
Potassium: 3.7 mmol/L (ref 3.5–5.1)
Sodium: 144 mmol/L (ref 135–145)
TOTAL PROTEIN: 4.9 g/dL — AB (ref 6.5–8.1)
Total Bilirubin: 0.6 mg/dL (ref 0.3–1.2)

## 2018-05-17 LAB — GLUCOSE, CAPILLARY: GLUCOSE-CAPILLARY: 94 mg/dL (ref 70–99)

## 2018-05-17 MED ORDER — TRAZODONE HCL 50 MG PO TABS
50.0000 mg | ORAL_TABLET | Freq: Every evening | ORAL | Status: DC | PRN
  Filled 2018-05-17: qty 1

## 2018-05-17 MED ORDER — PREDNISONE 50 MG PO TABS
60.0000 mg | ORAL_TABLET | Freq: Every day | ORAL | Status: DC
  Administered 2018-05-17 – 2018-05-20 (×4): 60 mg via ORAL
  Filled 2018-05-17 (×4): qty 1

## 2018-05-17 NOTE — Progress Notes (Signed)
Occupational Therapy Treatment Patient Details Name: [name] MRN: [id number] DOB: [birth year] Today's Date: 05/17/2018    History of present illness [name] is a 72 y.o. female with medical history significant for COPD with chronic hypoxic respiratory failure, hypertension, and chronic bilateral leg swelling and ulcerations, admitted with  shortness of breath, acute on chronic respiratory failure with COPD exacerbation   OT comments  Focused on pursed lip breathing as well as using flutter valve  Follow Up Recommendations  Home health OT(if there is another service; OT cannot stand alone)    Equipment Recommendations  None recommended by OT    Recommendations for Other Services      Precautions / Restrictions Precautions Precautions: Fall Precaution Comments: O2 dependent  Restrictions Weight Bearing Restrictions: No       Mobility Bed Mobility Overal bed mobility: Modified Independent                Transfers   Equipment used: None   Sit to Stand: Min guard         General transfer comment: for safety; OT managed tank    Balance Overall balance assessment: Needs assistance Sitting-balance support: Feet supported;No upper extremity supported Sitting balance-Leahy Scale: Good       Standing balance-Leahy Scale: Fair Standing balance comment: unsteady with wt shifting and no UE support                           ADL either performed or assessed with clinical judgement   ADL Overall ADL's : Needs assistance/impaired                         Toilet Transfer: Comfort height toilet;BSC;Supervision/safety   Toileting- Clothing Manipulation and Hygiene: Supervision/safety;Sit to/from stand;Cueing for sequencing;Cueing for safety       Functional mobility during ADLs: Min guard General ADL Comments: Pt performed 3 sets of sit to stand ( 5 reps) focusing on Pace, deep breathing and hand placement.  Pts maintained above 89  on 2L of oxygen but dropped to 83 on no oxygen.      Vision Patient Visual Report: No change from baseline            Cognition Arousal/Alertness: Awake/alert Behavior During Therapy: WFL for tasks assessed/performed Overall Cognitive Status: Within Functional Limits for tasks assessed                                               Frequency  Min 2X/week        Progress Toward Goals  OT Goals(current goals can now be found in the care plan section)  Progress towards OT goals: Progressing toward goals     Plan Discharge plan remains appropriate       AM-PAC OT "6 Clicks" Daily Activity     Outcome Measure   Help from another person eating meals?: None Help from another person taking care of personal grooming?: None Help from another person toileting, which includes using toliet, bedpan, or urinal?: A Little Help from another person bathing (including washing, rinsing, drying)?: A Little Help from another person to put on and taking off regular upper body clothing?: None Help from another person to put on and taking off regular lower body clothing?: A Little 6 Click Score: 21  End of Session Equipment Utilized During Treatment: Oxygen  OT Visit Diagnosis: Muscle weakness (generalized) (M62.81)   Activity Tolerance Patient tolerated treatment well   Patient Left in chair;with call bell/phone within reach   Nurse Communication          Time: 4388-8757 OT Time Calculation (min): 15 min  Charges: OT General Charges $OT Visit: 1 Visit OT Treatments $Self Care/Home Management : 8-22 mins  Kari Baars, [place] [phone] Office- [phone]      [place], Edwena Felty D 05/17/2018, 11:57 AM

## 2018-05-17 NOTE — Progress Notes (Signed)
Physical Therapy Treatment Patient Details Name: [name] MRN: [id number] DOB: 08/25/1945 Today's Date: 05/17/2018    History of Present Illness [name] is a 72 y.o. female with medical history significant for COPD with chronic hypoxic respiratory failure, hypertension, and chronic bilateral leg swelling and ulcerations, admitted with  shortness of breath, acute on chronic respiratory failure with COPD exacerbation    PT Comments    Pt OOB in recliner present with 2/4 dyspnea "I just finished using BSC" which was located close by.  Allowed a 4 min recovery period.  Pt performing self purse lip breathing.  Pt on 3 lts nasal at rest 94%.  Assisted with amb.  General Gait Details: pt declined need for any AD and amb a functional distance with 50% lean on dynamat for qccassional drift.  Pt required 3 lts to maintain sats 88% and >.  Limited distance with 3/4 dyspnea and approx 5 min rest recovery with purse lip breathing needed after activity.    Follow Up Recommendations  No PT follow up     Equipment Recommendations  None recommended by PT    Recommendations for Other Services       Precautions / Restrictions Precautions Precautions: Fall Precaution Comments: monitor sats  Restrictions Weight Bearing Restrictions: No    Mobility  Bed Mobility               General bed mobility comments: OOB in recliner   Transfers Overall transfer level: Needs assistance Equipment used: None Transfers: Sit to/from Stand;Stand Pivot Transfers Sit to Stand: Supervision;Min guard Stand pivot transfers: Supervision;Min guard       General transfer comment: min safety for environmental hazzards (bed side table, O2 tubing)   Ambulation/Gait Ambulation/Gait assistance: Min guard Gait Distance (Feet): 75 Feet Assistive device: None(50% use/lean on dynamat) Gait Pattern/deviations: Step-through pattern;Decreased stride length;Trunk flexed;Drifts right/left Gait velocity:  decreased    General Gait Details: pt declined need for any AD and amb a functional distance with 50% lean on dynamat for qccassional drift.  Pt required 3 lts to maintain sats 88% and >.  Limited distance with 3/4 dyspnea and approx 5 min rest recovery with purse lip breathing needed after activity.     Stairs             Wheelchair Mobility    Modified Rankin (Stroke Patients Only)       Balance                                            Cognition Arousal/Alertness: Awake/alert Behavior During Therapy: WFL for tasks assessed/performed Overall Cognitive Status: Within Functional Limits for tasks assessed                                        Exercises      General Comments        Pertinent Vitals/Pain      Home Living                      Prior Function            PT Goals (current goals can now be found in the care plan section) Progress towards PT goals: Progressing toward goals    Frequency  Min 3X/week      PT Plan Current plan remains appropriate    Co-evaluation              AM-PAC PT "6 Clicks" Mobility   Outcome Measure  Help needed turning from your back to your side while in a flat bed without using bedrails?: None Help needed moving from lying on your back to sitting on the side of a flat bed without using bedrails?: None Help needed moving to and from a bed to a chair (including a wheelchair)?: None Help needed standing up from a chair using your arms (e.g., wheelchair or bedside chair)?: A Little Help needed to walk in hospital room?: A Little Help needed climbing 3-5 steps with a railing? : A Little 6 Click Score: 21    End of Session Equipment Utilized During Treatment: Gait belt Activity Tolerance: Patient limited by fatigue;Other (comment)(dyspnea) Patient left: with call bell/phone within reach;in chair Nurse Communication: Mobility status PT Visit Diagnosis:  Unsteadiness on feet (R26.81)     Time: 1335-1400 PT Time Calculation (min) (ACUTE ONLY): 25 min  Charges:  $Gait Training: 8-22 mins $Therapeutic Activity: 8-22 mins                     Rica Koyanagi  PTA Acute  Rehabilitation Services Pager      [phone] Office      [phone]

## 2018-05-17 NOTE — Progress Notes (Signed)
SATURATION QUALIFICATIONS: (This note is used to comply with regulatory documentation for home oxygen)  Patient Saturations on Room Air at Rest = 89%  Patient Saturations on Room Air while Ambulating = 83%  Patient Saturations on 3 Liters of oxygen while Ambulating = 92%  Please briefly explain why patient needs home oxygen: Pt receiving steroids and nebs and unable to wean O2

## 2018-05-17 NOTE — Care Management Important Message (Signed)
Important Message  Patient Details  Name: SUZZANNE BRUNKHORST MRN: [id number] Date of Birth: 1946/03/30   Medicare Important Message Given:  Yes    Kerin Salen 05/17/2018, 11:04 AMImportant Message  Patient Details  Name: JACOBY RITSEMA MRN: [id number] Date of Birth: [birth year]   Medicare Important Message Given:  Yes    Kerin Salen 05/17/2018, 11:04 AM

## 2018-05-17 NOTE — Progress Notes (Addendum)
PROGRESS NOTE    [name]  RXV:[id number] DOB: 13-Jul-1945 DOA: 05/08/2018 PCP: Christain [place], MD   Brief Narrative:  HPI on 05/09/2018 by Dr. Mitzi Hansen [name] is a 72 y.o. female with medical history significant for COPD with chronic hypoxic respiratory failure, hypertension, and chronic bilateral leg swelling and ulcerations, now presenting to the emergency department for evaluation of shortness of breath and productive cough.  Patient was seen in her pulmonology clinic on 05/03/2018 with productive cough and increased dyspnea, was diagnosed with acute exacerbation in COPD and started on prednisone and azithromycin.  She failed to improve, was seen back on 05/06/2018 and reports that her prednisone course was extended.  Unfortunately, she still has not had any significant improvement and may actually be worsening.  She has been dyspneic at rest for the past couple days and has had difficulty sleeping due to this.  She denies fevers, chills, or chest pain.  Reports that her chronic bilateral lower extremity swelling is not as bad as usual.  Interim history Admitted for COPD exacerbation and respiratory failure. Improving very slowly.   Assessment & Plan   Acute on chronic hypoxic respiratory failure with COPD Exacerbation -Presented with worsening shortness of breath, cough despite being treated with prednisone and azithromycin since 05/03/2018 -Afebrile on admission -Patient did require BiPAP when admitted -Was treated with albuterol nebulizers x3, prednisone as well as Levaquin in the ED -Pending sputum culture if able -Continue nebulizer treatments, antitussives, mucinex  -Was on solumedrol, will transition to oral prednisone -Completed course of Levaquin  -Transitioned to nasal cannula, 3L -CXR 11/30, showing improvement -with ambulation, patient oxygen saturations dropped to 83% on room air  Hyponatremia -Sodium 130 on admission, up to 144 -IVF discontinued    Essential hypertension -Continue diltiazem, losartan  Alcohol use -She appears to be stable and not in withdrawal -Continue CIWA protocol, folic acid, thiamine, multivitamin   Leukocytosis  -likely due to steroids -was treated with levaquin  Hyperglycemia -no history of diabetes  -hemoglobin A1c 6.1  Deconditioning -secondary to respiratory status/illness -PT evaluated patient, no further needs -OT recommended HH  DVT Prophylaxis  lovenox  Code Status: Full  Family Communication: None at bedside  Disposition Plan: Admitted. Appears to be improving slowly. Suspect home in 1-2 days with home oxygen.  Consultants None  Procedures  None  Antibiotics   Anti-infectives (From admission, onward)   Start     Dose/Rate Route Frequency Ordered Stop   05/09/18 2200  levofloxacin (LEVAQUIN) IVPB 500 mg     500 mg 100 mL/hr over 60 Minutes Intravenous Every 24 hours 05/09/18 0211 05/12/18 2200   05/08/18 2215  levofloxacin (LEVAQUIN) IVPB 500 mg     500 mg 100 mL/hr over 60 Minutes Intravenous  Once 05/08/18 2214 05/08/18 2343      Subjective:   [name] seen and examined today.  Continues to have some shortness of breath with ambulation and exertion.  Concerned that she may need oxygen when she goes home.  Currently denies chest pain, abdominal pain, nausea or vomiting, diarrhea or constipation, dizziness or headache. Objective:   Vitals:   05/16/18 2205 05/17/18 0626 05/17/18 0851 05/17/18 1035  BP: (!) 165/80 (!) 160/86    Pulse: 90 70    Resp: 18 16    Temp: 98 F (36.7 C) 98.6 F (37 C)    TempSrc: Oral Oral    SpO2: 91% 94% 96% 92%  Weight:      Height:  Intake/Output Summary (Last 24 hours) at 05/17/2018 1333 Last data filed at 05/17/2018 0831 Gross per 24 hour  Intake 930 ml  Output -  Net 930 ml   Filed Weights   05/08/18 2035 05/09/18 0346  Weight: 47.6 kg 48.5 kg   Exam  General: Well developed, chronically ill appearing,  NAD  HEENT: NCAT, mucous membranes moist.   Neck: Supple  Cardiovascular: S1 S2 auscultated, RRR, no murmur  Respiratory: Diminished breath sounds, expiratory wheezing   Abdomen: Soft, nontender, nondistended, + bowel sounds  Extremities: warm dry without cyanosis clubbing or edema  Neuro: AAOx3, nonfocal  Psych: Normal affect and demeanor, pleasant   Data Reviewed: I have personally reviewed following labs and imaging studies  CBC: Recent Labs  Lab 05/12/18 0756 05/13/18 0337 05/14/18 0433 05/15/18 0502  WBC 10.9* 9.0 9.9 12.9*  NEUTROABS 9.7* 8.3* 9.2* 11.9*  HGB 14.2 13.5 14.0 14.2  HCT 45.4 43.1 45.7 45.4  MCV 92.3 94.7 92.7 92.5  PLT 245 219 239 021   Basic Metabolic Panel: Recent Labs  Lab 05/12/18 0756 05/13/18 0337 05/14/18 0433 05/15/18 0502 05/16/18 0418 05/17/18 0446  NA 139 141 141 141  --  144  K 4.2 3.6 4.0 4.3  --  3.7  CL 96* 102 98 97*  --  104  CO2 38* 34* 35* 35*  --  33*  GLUCOSE 154* 185* 186* 185*  --  138*  BUN 23 31* 37* 35*  --  31*  CREATININE 0.45 0.40* 0.47 0.49 0.43* 0.49  CALCIUM 8.8* 8.5* 8.6* 8.5*  --  8.3*  MG 2.2 2.2 2.1 2.2  --   --   PHOS 2.9 3.1 3.7 3.4  --   --    GFR: Estimated Creatinine Clearance: 48.7 mL/min (by C-G formula based on SCr of 0.49 mg/dL). Liver Function Tests: Recent Labs  Lab 05/12/18 0756 05/13/18 0337 05/14/18 0433 05/15/18 0502 05/17/18 0446  AST 16 14* _0 ALT _1 42  ALKPHOS 36* 34* 33* 39 33*  BILITOT 0.4 0.4 0.6 1.6* 0.6  PROT 5.6* 5.3* 5.2* 5.2* 4.9*  ALBUMIN 3.2* 2.9* 2.9* 3.0* 2.7*   No results for input(s): LIPASE, AMYLASE in the last 168 hours. No results for input(s): AMMONIA in the last 168 hours. Coagulation Profile: No results for input(s): INR, PROTIME in the last 168 hours. Cardiac Enzymes: No results for input(s): CKTOTAL, CKMB, CKMBINDEX, TROPONINI in the last 168 hours. BNP (last 3 results) No results for input(s): PROBNP in the last 8760  hours. HbA1C: No results for input(s): HGBA1C in the last 72 hours. CBG: Recent Labs  Lab 05/13/18 0758 05/14/18 0732 05/15/18 0738 05/16/18 0757 05/17/18 0749  GLUCAP 160* 157* 133* 165* 94   Lipid Profile: No results for input(s): CHOL, HDL, LDLCALC, TRIG, CHOLHDL, LDLDIRECT in the last 72 hours. Thyroid Function Tests: No results for input(s): TSH, T4TOTAL, FREET4, T3FREE, THYROIDAB in the last 72 hours. Anemia Panel: No results for input(s): VITAMINB12, FOLATE, FERRITIN, TIBC, IRON, RETICCTPCT in the last 72 hours. Urine analysis:    Component Value Date/Time   COLORURINE STRAW (A) 05/09/2018 0646   APPEARANCEUR CLEAR 05/09/2018 0646   LABSPEC 1.005 05/09/2018 0646   PHURINE 7.0 05/09/2018 0646   GLUCOSEU 150 (A) 05/09/2018 0646   HGBUR NEGATIVE 05/09/2018 0646   BILIRUBINUR NEGATIVE 05/09/2018 0646   KETONESUR 5 (A) 05/09/2018 0646   PROTEINUR NEGATIVE 05/09/2018 0646   NITRITE NEGATIVE 05/09/2018 0646   LEUKOCYTESUR NEGATIVE  05/09/2018 0646   Sepsis Labs: _0 (procalcitonin:4,lacticidven:4)  ) Recent Results (from the past 240 hour(s))  MRSA PCR Screening     Status: None   Collection Time: 05/09/18  3:41 AM  Result Value Ref Range Status   MRSA by PCR NEGATIVE NEGATIVE Final    Comment:        The GeneXpert MRSA Assay (FDA approved for NASAL specimens only), is one component of a comprehensive MRSA colonization surveillance program. It is not intended to diagnose MRSA infection nor to guide or monitor treatment for MRSA infections. Performed at [facility], [place] [address], [place], [place] [address]       Radiology Studies: No results found.   Scheduled Meds: . arformoterol  15 mcg Nebulization BID  . benzonatate  200 mg Oral TID  . budesonide (PULMICORT) nebulizer solution  0.25 mg Nebulization BID  . buPROPion  150 mg Oral Daily  . cholecalciferol  1,000 Units Oral Daily  . diltiazem  360 mg Oral Daily  .  enoxaparin (LOVENOX) injection  40 mg Subcutaneous Q24H  . folic acid  1 mg Oral Daily  . guaiFENesin  1,200 mg Oral BID  . losartan  100 mg Oral Daily  . mouth rinse  15 mL Mouth Rinse BID  . multivitamin with minerals  1 tablet Oral Daily  . pantoprazole  40 mg Oral Daily  . predniSONE  60 mg Oral Q breakfast  . sodium chloride flush  3 mL Intravenous Q12H  . thiamine  100 mg Oral Daily  . umeclidinium bromide  1 puff Inhalation Daily   Continuous Infusions:    LOS: 7 days   Time Spent in minutes   30 minutes  Dustine Stickler D.O. on 05/17/2018 at 1:33 PM  Between 7am to 7pm - Please see pager noted on [email or web address]  After 7pm go to [email or web address]  And look for the night coverage person covering for me after hours  [facility] Office  [phone]

## 2018-05-17 NOTE — Progress Notes (Signed)
Will continue to follow pt for HHPT and Home O2. Pt states that she used [facility] years ago.  [facility] was called to verify service.

## 2018-05-18 LAB — GLUCOSE, CAPILLARY: GLUCOSE-CAPILLARY: 122 mg/dL — AB (ref 70–99)

## 2018-05-18 NOTE — Care Management Note (Signed)
Case Management Note  Patient Details  Name: [name] MRN: [id number] Date of Birth: Jun 23, 1945  Subjective/Objective:                    Action/Plan:Pt will discharge home with [facility]/COPD program and [facility] for home O2.    Expected Discharge Date:                  Expected Discharge Plan:  [place]  In-House Referral:     Discharge planning Services  CM Consult  Post Acute Care Choice:    Choice offered to:     DME Arranged:    DME Agency:     HH Arranged:  RN, PT HH Agency:  [place]  Status of Service:  Completed, signed off  If discussed at [place] of Stay Meetings, dates discussed:    Additional CommentsPurcell Mouton, RN 05/18/2018, 1:08 PM

## 2018-05-18 NOTE — Progress Notes (Signed)
Resumed care from previous RN. Pt sitting on side of bed. Denise pain, N/V. Patient used bathroom and has labored/pursed lip breathing after. Will continue to monitor. Slevin Gunby A

## 2018-05-18 NOTE — Care Management Note (Signed)
Case Management Note  Patient Details  Name: LYNSIE MCWATTERS MRN: [id number] Date of Birth: [birth year]  Subjective/Objective:                    Action/Plan:   Expected Discharge Date:                  Expected Discharge Plan:  [place]  In-House Referral:     Discharge planning Services  CM Consult  Post Acute Care Choice:    Choice offered to:     DME Arranged:  Oxygen DME Agency[facility]  HH Arranged:  RN, PT [place] Agency:  [facility]  Status of Service:  Completed, signed off  If discussed at [place] of Stay Meetings, dates discussed:    Additional CommentsPurcell Mouton, RN 05/18/2018, 1:23 PM

## 2018-05-18 NOTE — Progress Notes (Signed)
PROGRESS NOTE    [name]  FIE:[id number] DOB: 01/29/1946 DOA: 05/08/2018 PCP: Christain Sacramento, MD   Brief Narrative:  HPI on 05/09/2018 by Dr. Mitzi Hansen [name] is a 72 y.o. female with medical history significant for COPD with chronic hypoxic respiratory failure, hypertension, and chronic bilateral leg swelling and ulcerations, now presenting to the emergency department for evaluation of shortness of breath and productive cough.  Patient was seen in her pulmonology clinic on 05/03/2018 with productive cough and increased dyspnea, was diagnosed with acute exacerbation in COPD and started on prednisone and azithromycin.  She failed to improve, was seen back on 05/06/2018 and reports that her prednisone course was extended.  Unfortunately, she still has not had any significant improvement and may actually be worsening.  She has been dyspneic at rest for the past couple days and has had difficulty sleeping due to this.  She denies fevers, chills, or chest pain.  Reports that her chronic bilateral lower extremity swelling is not as bad as usual.  Interim history Admitted for COPD exacerbation and respiratory failure. Improving very slowly.   Assessment & Plan   Acute on chronic hypoxic respiratory failure with COPD Exacerbation -Presented with worsening shortness of breath, cough despite being treated with prednisone and azithromycin since 05/03/2018 -COPD Gold III -Afebrile on admission -Patient did require BiPAP when admitted -Was treated with albuterol nebulizers x3, prednisone as well as Levaquin in the ED -Pending sputum culture if able -Continue nebulizer treatments, antitussives, mucinex  -Was on solumedrol, transitioned to oral prednisone -Completed course of Levaquin  -Transitioned to nasal cannula, 3L -CXR 11/30, showing improvement -with ambulation, patient oxygen saturations dropped to 83% on room air -patient is afraid of going home- she feels she will be more  short of breath -have placed call to Dr. Melvyn Novas, patient's pulmonologist, awaiting callback  Hyponatremia -Sodium 130 on admission, up to 144 -IVF discontinued   Essential hypertension -Continue diltiazem, losartan  Alcohol use -She appears to be stable and not in withdrawal -Continue CIWA protocol, folic acid, thiamine, multivitamin   Leukocytosis  -likely due to steroids -was treated with levaquin  Hyperglycemia -no history of diabetes  -hemoglobin A1c 6.1  Deconditioning -secondary to respiratory status/illness -PT evaluated patient, no further needs -OT recommended HH -have placed HH orders for PT and OT  DVT Prophylaxis  lovenox  Code Status: Full  Family Communication: None at bedside  Disposition Plan: Admitted. Appears to be improving slowly. Suspect home in 1-2 days with home oxygen.  Consultants None  Procedures  None  Antibiotics   Anti-infectives (From admission, onward)   Start     Dose/Rate Route Frequency Ordered Stop   05/09/18 2200  levofloxacin (LEVAQUIN) IVPB 500 mg     500 mg 100 mL/hr over 60 Minutes Intravenous Every 24 hours 05/09/18 0211 05/12/18 2200   05/08/18 2215  levofloxacin (LEVAQUIN) IVPB 500 mg     500 mg 100 mL/hr over 60 Minutes Intravenous  Once 05/08/18 2214 05/08/18 2343      Subjective:   [name] seen and examined today.  Continues to have shortness of breath and afraid of going home. Feels it is hard to breath when exerting herself. Denies chest pain, abdominal pain, nausea, vomiting, diarrhea, constipation.   Objective:   Vitals:   05/17/18 2033 05/17/18 2034 05/17/18 2114 05/18/18 0826  BP:   (!) 155/77   Pulse:   83   Resp:   18   Temp:   98  F (36.7 C)   TempSrc:   Oral   SpO2: 93% 93% 95% 94%  Weight:      Height:        Intake/Output Summary (Last 24 hours) at 05/18/2018 1231 Last data filed at 05/18/2018 0600 Gross per 24 hour  Intake 363 ml  Output 1 ml  Net 362 ml   Filed Weights    05/08/18 2035 05/09/18 0346  Weight: 47.6 kg 48.5 kg   Exam  General: Well developed, chronically ill appearing, NAD  HEENT: NCAT, mucous membranes moist.   Neck: Supple  Cardiovascular: S1 S2 auscultated, no murmur, RRR  Respiratory: Diminished breath sounds.  Diffuse exp wheezing  Abdomen: Soft, nontender, nondistended, + bowel sounds  Extremities: warm dry without cyanosis clubbing or edema  Neuro: AAOx3, nonfocal  Psych: Normal affect and demeanor, pleasant   Data Reviewed: I have personally reviewed following labs and imaging studies  CBC: Recent Labs  Lab 05/12/18 0756 05/13/18 0337 05/14/18 0433 05/15/18 0502  WBC 10.9* 9.0 9.9 12.9*  NEUTROABS 9.7* 8.3* 9.2* 11.9*  HGB 14.2 13.5 14.0 14.2  HCT 45.4 43.1 45.7 45.4  MCV 92.3 94.7 92.7 92.5  PLT 245 219 239 185   Basic Metabolic Panel: Recent Labs  Lab 05/12/18 0756 05/13/18 0337 05/14/18 0433 05/15/18 0502 05/16/18 0418 05/17/18 0446  NA 139 141 141 141  --  144  K 4.2 3.6 4.0 4.3  --  3.7  CL 96* 102 98 97*  --  104  CO2 38* 34* 35* 35*  --  33*  GLUCOSE 154* 185* 186* 185*  --  138*  BUN 23 31* 37* 35*  --  31*  CREATININE 0.45 0.40* 0.47 0.49 0.43* 0.49  CALCIUM 8.8* 8.5* 8.6* 8.5*  --  8.3*  MG 2.2 2.2 2.1 2.2  --   --   PHOS 2.9 3.1 3.7 3.4  --   --    GFR: Estimated Creatinine Clearance: 48.7 mL/min (by C-G formula based on SCr of 0.49 mg/dL). Liver Function Tests: Recent Labs  Lab 05/12/18 0756 05/13/18 0337 05/14/18 0433 05/15/18 0502 05/17/18 0446  AST 16 14* _0 ALT _1 42  ALKPHOS 36* 34* 33* 39 33*  BILITOT 0.4 0.4 0.6 1.6* 0.6  PROT 5.6* 5.3* 5.2* 5.2* 4.9*  ALBUMIN 3.2* 2.9* 2.9* 3.0* 2.7*   No results for input(s): LIPASE, AMYLASE in the last 168 hours. No results for input(s): AMMONIA in the last 168 hours. Coagulation Profile: No results for input(s): INR, PROTIME in the last 168 hours. Cardiac Enzymes: No results for input(s): CKTOTAL, CKMB,  CKMBINDEX, TROPONINI in the last 168 hours. BNP (last 3 results) No results for input(s): PROBNP in the last 8760 hours. HbA1C: No results for input(s): HGBA1C in the last 72 hours. CBG: Recent Labs  Lab 05/14/18 0732 05/15/18 0738 05/16/18 0757 05/17/18 0749 05/18/18 0758  GLUCAP 157* 133* 165* 94 122*   Lipid Profile: No results for input(s): CHOL, HDL, LDLCALC, TRIG, CHOLHDL, LDLDIRECT in the last 72 hours. Thyroid Function Tests: No results for input(s): TSH, T4TOTAL, FREET4, T3FREE, THYROIDAB in the last 72 hours. Anemia Panel: No results for input(s): VITAMINB12, FOLATE, FERRITIN, TIBC, IRON, RETICCTPCT in the last 72 hours. Urine analysis:    Component Value Date/Time   COLORURINE STRAW (A) 05/09/2018 0646   APPEARANCEUR CLEAR 05/09/2018 0646   LABSPEC 1.005 05/09/2018 0646   PHURINE 7.0 05/09/2018 0646   GLUCOSEU 150 (A) 05/09/2018 6314  HGBUR NEGATIVE 05/09/2018 0646   BILIRUBINUR NEGATIVE 05/09/2018 0646   KETONESUR 5 (A) 05/09/2018 0646   PROTEINUR NEGATIVE 05/09/2018 0646   NITRITE NEGATIVE 05/09/2018 0646   LEUKOCYTESUR NEGATIVE 05/09/2018 0646   Sepsis Labs: _0 (procalcitonin:4,lacticidven:4)  ) Recent Results (from the past 240 hour(s))  MRSA PCR Screening     Status: None   Collection Time: 05/09/18  3:41 AM  Result Value Ref Range Status   MRSA by PCR NEGATIVE NEGATIVE Final    Comment:        The GeneXpert MRSA Assay (FDA approved for NASAL specimens only), is one component of a comprehensive MRSA colonization surveillance program. It is not intended to diagnose MRSA infection nor to guide or monitor treatment for MRSA infections. Performed at [facility], [place] [address], [place], [place] [address]       Radiology Studies: No results found.   Scheduled Meds: . arformoterol  15 mcg Nebulization BID  . benzonatate  200 mg Oral TID  . budesonide (PULMICORT) nebulizer solution  0.25 mg Nebulization BID  .  buPROPion  150 mg Oral Daily  . cholecalciferol  1,000 Units Oral Daily  . diltiazem  360 mg Oral Daily  . enoxaparin (LOVENOX) injection  40 mg Subcutaneous Q24H  . folic acid  1 mg Oral Daily  . guaiFENesin  1,200 mg Oral BID  . losartan  100 mg Oral Daily  . mouth rinse  15 mL Mouth Rinse BID  . multivitamin with minerals  1 tablet Oral Daily  . pantoprazole  40 mg Oral Daily  . predniSONE  60 mg Oral Q breakfast  . sodium chloride flush  3 mL Intravenous Q12H  . thiamine  100 mg Oral Daily  . umeclidinium bromide  1 puff Inhalation Daily   Continuous Infusions:    LOS: 8 days   Time Spent in minutes   30 minutes  Wadsworth Skolnick D.O. on 05/18/2018 at 12:31 PM  Between 7am to 7pm - Please see pager noted on [email or web address]  After 7pm go to [email or web address]  And look for the night coverage person covering for me after hours  [facility] Office  [phone]

## 2018-05-18 NOTE — Progress Notes (Signed)
[facility] [phone] is pt's supplier for home O2 and was called for a tank to be delivered here for pt's discharge.  Pt was on 2L at home, plan for pt to discharge home on 3L.

## 2018-05-19 LAB — BASIC METABOLIC PANEL
Anion gap: 6 (ref 5–15)
BUN: 25 mg/dL — ABNORMAL HIGH (ref 8–23)
CO2: 35 mmol/L — ABNORMAL HIGH (ref 22–32)
Calcium: 8.5 mg/dL — ABNORMAL LOW (ref 8.9–10.3)
Chloride: 104 mmol/L (ref 98–111)
Creatinine, Ser: 0.4 mg/dL — ABNORMAL LOW (ref 0.44–1.00)
GFR calc Af Amer: >60 mL/min (ref >60)
GFR calc non Af Amer: >60 mL/min (ref >60)
Glucose, Bld: 98 mg/dL (ref 70–99)
Potassium: 4.1 mmol/L (ref 3.5–5.1)
SODIUM: 145 mmol/L (ref 135–145)

## 2018-05-19 LAB — CBC
HEMATOCRIT: 46.2 % — AB (ref 36.0–46.0)
Hemoglobin: 14.2 g/dL (ref 12.0–15.0)
MCH: 28.9 pg (ref 26.0–34.0)
MCHC: 30.7 g/dL (ref 30.0–36.0)
MCV: 93.9 fL (ref 80.0–100.0)
Platelets: 197 10*3/uL (ref 150–400)
RBC: 4.92 MIL/uL (ref 3.87–5.11)
RDW: 14.1 % (ref 11.5–15.5)
WBC: 10.7 10*3/uL — ABNORMAL HIGH (ref 4.0–10.5)
nRBC: 0 % (ref 0.0–0.2)

## 2018-05-19 LAB — GLUCOSE, CAPILLARY: Glucose-Capillary: 134 mg/dL — ABNORMAL HIGH (ref 70–99)

## 2018-05-19 MED ORDER — IPRATROPIUM-ALBUTEROL 0.5-2.5 (3) MG/3ML IN SOLN
3.0000 mL | Freq: Four times a day (QID) | RESPIRATORY_TRACT | Status: DC
  Administered 2018-05-19 – 2018-05-20 (×4): 3 mL via RESPIRATORY_TRACT
  Filled 2018-05-19 (×5): qty 3

## 2018-05-19 NOTE — Progress Notes (Signed)
Physical Therapy Treatment Patient Details Name: [name] MRN: [id number] DOB: [birth year] Today's Date: 05/19/2018    History of Present Illness  72 y.o. female with medical history significant for COPD with chronic hypoxic respiratory failure, hypertension, and chronic bilateral leg swelling and ulcerations, admitted with  shortness of breath, acute on chronic respiratory failure with COPD exacerbation    PT Comments    Progressing with mobility. O2 sat 91% on 3L [place] O2 during ambulation   Follow Up Recommendations  No PT follow up     Equipment Recommendations  None recommended by PT    Recommendations for Other Services       Precautions / Restrictions Precautions Precautions: Fall Precaution Comments: monitor sats  Restrictions Weight Bearing Restrictions: No    Mobility  Bed Mobility Overal bed mobility: Modified Independent                Transfers Overall transfer level: Needs assistance   Transfers: Sit to/from Stand Sit to Stand: Supervision         General transfer comment: for safety, lines  Ambulation/Gait Ambulation/Gait assistance: Min guard Gait Distance (Feet): 85 Feet(x2) Assistive device: None(used hallway handrail on 1st walk) Gait Pattern/deviations: Step-through pattern     General Gait Details: close guard for safety. seated rest break for energy conservation. O2 sat 91% on 3L [place] O2, dyspnea 2/4.    Stairs             Wheelchair Mobility    Modified Rankin (Stroke Patients Only)       Balance Overall balance assessment: Needs assistance           Standing balance-Leahy Scale: Fair                              Cognition Arousal/Alertness: Awake/alert Behavior During Therapy: WFL for tasks assessed/performed Overall Cognitive Status: Within Functional Limits for tasks assessed                                        Exercises      General Comments        Pertinent  Vitals/Pain Pain Assessment: No/denies pain    Home Living                      Prior Function            PT Goals (current goals can now be found in the care plan section) Progress towards PT goals: Progressing toward goals    Frequency    Min 3X/week      PT Plan Current plan remains appropriate    Co-evaluation              AM-PAC PT "6 Clicks" Mobility   Outcome Measure  Help needed turning from your back to your side while in a flat bed without using bedrails?: None Help needed moving from lying on your back to sitting on the side of a flat bed without using bedrails?: None Help needed moving to and from a bed to a chair (including a wheelchair)?: None Help needed standing up from a chair using your arms (e.g., wheelchair or bedside chair)?: None Help needed to walk in hospital room?: A Little Help needed climbing 3-5 steps with a railing? : A Little 6 Click Score: 22  End of Session Equipment Utilized During Treatment: Oxygen Activity Tolerance: Patient tolerated treatment well Patient left: in bed;with call bell/phone within reach   PT Visit Diagnosis: Unsteadiness on feet (R26.81)     Time: 1901-2224 PT Time Calculation (min) (ACUTE ONLY): 12 min  Charges:  $Gait Training: 8-22 mins                        Weston Anna, [place] Pager: [phone] Office: [phone]

## 2018-05-19 NOTE — Progress Notes (Signed)
PROGRESS NOTE    [name]  OEU:[id number] DOB: 1946/02/24 DOA: 05/08/2018 PCP: Christain Sacramento, MD   Brief Narrative:  HPI on 05/09/2018 by Dr. Judithann [name] F [name] a 72 y.o.femalewith medical history significant forCOPD with chronic hypoxic respiratory failure, hypertension, and chronic bilateral leg swelling and ulcerations, now presenting to the emergency department for evaluation of shortness of breath and productive cough. Patient was seen in her pulmonology clinic on 05/03/2018 with productive cough and increased dyspnea, was diagnosed with acute exacerbation in COPD and started on prednisone and azithromycin. She failed to improve, was seen back on 05/06/2018 and reports that her prednisone course was extended. Unfortunately, she still has not had any significant improvement and may actually be worsening. She has been dyspneic at rest for the past couple days and has had difficulty sleeping due to this. She denies fevers, chills, or chest pain. Reports that her chronic bilateral lower extremity swelling is not as bad as usual.  Assessment & Plan:   Principal Problem:   COPD with acute exacerbation ([place]) Active Problems:   Essential hypertension   Chronic respiratory failure with hypoxia ([facility])   Hyponatremia   Acute urinary retention   Acute on chronic hypoxic respiratory failure with COPD Exacerbation -Presented with worsening shortness of breath, cough despite being treated with prednisone and azithromycin since 05/03/2018 - long hospital stay with slow improvement, admitted on 11/23 and required BIPAP at admission, and has completed course of abx with levaquin on 11/27.  She's now been transitioned from IV solumedrol to PO prednisone. -COPD Gold III -Pending sputum culture if able -Will continue prn and scheduled nebs (switch to scheduled duonebs from [facility] until more ready for discharge) -Continue prednisone, will need taper -Completed course of  Levaquin  -Continues to require supplemental O2 (3 L now, was previously on 2 L at night only) -CXR 11/30, showing improvement -she continues to require supplemental O2 with significant diffuse bilateral wheezes on exam (transitioned back to duonebs as noted above) -Dr. Ree Kida placed call to Dr. Melvyn Novas, will try again today  Hyponatremia - resolved  Essential hypertension -Continue diltiazem, losartan  Alcohol use -She appears to be stable and not in withdrawal -Continue CIWA protocol, folic acid, thiamine, multivitamin   Leukocytosis  -likely due to steroids -was treated with levaquin  Hyperglycemia -no history of diabetes  -hemoglobin A1c 6.1  Deconditioning -secondary to respiratory status/illness -PT evaluated patient, no further needs -OT recommended HH -have placed [place] orders for PT and OT  DVT prophylaxis: lovenox Code Status: full  Family Communication: none at bedside Disposition Plan: pending further improvement in respiratory status  Consultants:   none  Procedures:   none  Antimicrobials:  Anti-infectives (From admission, onward)   Start     Dose/Rate Route Frequency Ordered Stop   05/09/18 2200  levofloxacin (LEVAQUIN) IVPB 500 mg     500 mg 100 mL/hr over 60 Minutes Intravenous Every 24 hours 05/09/18 0211 05/12/18 2200   05/08/18 2215  levofloxacin (LEVAQUIN) IVPB 500 mg     500 mg 100 mL/hr over 60 Minutes Intravenous  Once 05/08/18 2214 05/08/18 2343      Subjective: Still with shortness of breath. At abseline was on 2 L at night only.  Objective: Vitals:   05/18/18 2120 05/19/18 0502 05/19/18 0825 05/19/18 1503  BP: 132/73 130/85  (!) 142/71  Pulse: 66 66  81  Resp: 18 20  (!) 24  Temp: 98.2 F (36.8 C) 98.8 F (37.1 C)  98.7 F (37.1 C)  TempSrc: Oral Oral  Oral  SpO2: 100% 100% 99% 94%  Weight:      Height:        Intake/Output Summary (Last 24 hours) at 05/19/2018 1525 Last data filed at 05/19/2018 1503 Gross per 24  hour  Intake 1200 ml  Output 2 ml  Net 1198 ml   Filed Weights   05/08/18 2035 05/09/18 0346  Weight: 47.6 kg 48.5 kg    Examination:  General exam: Appears calm and comfortable  Respiratory system: decreased breath sounds, diffuse wheezing throughout  Cardiovascular system: S1 & S2 heard, RRR.  Gastrointestinal system: Abdomen is nondistended, soft and nontender Central nervous system: Alert and oriented. No focal neurological deficits. Extremities: mild LEE Skin: No rashes, lesions or ulcers Psychiatry: Judgement and insight appear normal. Mood & affect appropriate.     Data Reviewed: I have personally reviewed following labs and imaging studies  CBC: Recent Labs  Lab 05/13/18 0337 05/14/18 0433 05/15/18 0502 05/19/18 0450  WBC 9.0 9.9 12.9* 10.7*  NEUTROABS 8.3* 9.2* 11.9*  --   HGB 13.5 14.0 14.2 14.2  HCT 43.1 45.7 45.4 46.2*  MCV 94.7 92.7 92.5 93.9  PLT 219 239 267 947   Basic Metabolic Panel: Recent Labs  Lab 05/13/18 0337 05/14/18 0433 05/15/18 0502 05/16/18 0418 05/17/18 0446 05/19/18 0450  NA 141 141 141  --  144 145  K 3.6 4.0 4.3  --  3.7 4.1  CL 102 98 97*  --  104 104  CO2 34* 35* 35*  --  33* 35*  GLUCOSE 185* 186* 185*  --  138* 98  BUN 31* 37* 35*  --  31* 25*  CREATININE 0.40* 0.47 0.49 0.43* 0.49 0.40*  CALCIUM 8.5* 8.6* 8.5*  --  8.3* 8.5*  MG 2.2 2.1 2.2  --   --   --   PHOS 3.1 3.7 3.4  --   --   --    GFR: Estimated Creatinine Clearance: 48.7 mL/min (A) (by C-G formula based on SCr of 0.4 mg/dL (L)). Liver Function Tests: Recent Labs  Lab 05/13/18 0337 05/14/18 0433 05/15/18 0502 05/17/18 0446  AST 14* _0 ALT _1 42  ALKPHOS 34* 33* 39 33*  BILITOT 0.4 0.6 1.6* 0.6  PROT 5.3* 5.2* 5.2* 4.9*  ALBUMIN 2.9* 2.9* 3.0* 2.7*   No results for input(s): LIPASE, AMYLASE in the last 168 hours. No results for input(s): AMMONIA in the last 168 hours. Coagulation Profile: No results for input(s): INR, PROTIME in the  last 168 hours. Cardiac Enzymes: No results for input(s): CKTOTAL, CKMB, CKMBINDEX, TROPONINI in the last 168 hours. BNP (last 3 results) No results for input(s): PROBNP in the last 8760 hours. HbA1C: No results for input(s): HGBA1C in the last 72 hours. CBG: Recent Labs  Lab 05/15/18 0738 05/16/18 0757 05/17/18 0749 05/18/18 0758 05/19/18 0804  GLUCAP 133* 165* 94 122* 134*   Lipid Profile: No results for input(s): CHOL, HDL, LDLCALC, TRIG, CHOLHDL, LDLDIRECT in the last 72 hours. Thyroid Function Tests: No results for input(s): TSH, T4TOTAL, FREET4, T3FREE, THYROIDAB in the last 72 hours. Anemia Panel: No results for input(s): VITAMINB12, FOLATE, FERRITIN, TIBC, IRON, RETICCTPCT in the last 72 hours. Sepsis Labs: No results for input(s): PROCALCITON, LATICACIDVEN in the last 168 hours.  No results found for this or any previous visit (from the past 240 hour(s)).       Radiology Studies: No results found.  Scheduled Meds: . arformoterol  15 mcg Nebulization BID  . benzonatate  200 mg Oral TID  . budesonide (PULMICORT) nebulizer solution  0.25 mg Nebulization BID  . buPROPion  150 mg Oral Daily  . cholecalciferol  1,000 Units Oral Daily  . diltiazem  360 mg Oral Daily  . enoxaparin (LOVENOX) injection  40 mg Subcutaneous Q24H  . folic acid  1 mg Oral Daily  . guaiFENesin  1,200 mg Oral BID  . ipratropium-albuterol  3 mL Nebulization Q6H  . losartan  100 mg Oral Daily  . mouth rinse  15 mL Mouth Rinse BID  . multivitamin with minerals  1 tablet Oral Daily  . pantoprazole  40 mg Oral Daily  . predniSONE  60 mg Oral Q breakfast  . sodium chloride flush  3 mL Intravenous Q12H  . thiamine  100 mg Oral Daily   Continuous Infusions:   LOS: 9 days    Time spent: over 30 min    Fayrene Helper, MD [facility] Pager [phone]  If 7PM-7AM, please contact night-coverage [email or web address] Password [email or web address] 05/19/2018, 3:25 PM

## 2018-05-20 LAB — COMPREHENSIVE METABOLIC PANEL
ALT: 57 U/L — ABNORMAL HIGH (ref 0–44)
AST: 20 U/L (ref 15–41)
Albumin: 2.7 g/dL — ABNORMAL LOW (ref 3.5–5.0)
Alkaline Phosphatase: 37 U/L — ABNORMAL LOW (ref 38–126)
Anion gap: 4 — ABNORMAL LOW (ref 5–15)
BUN: 31 mg/dL — ABNORMAL HIGH (ref 8–23)
CHLORIDE: 106 mmol/L (ref 98–111)
CO2: 34 mmol/L — ABNORMAL HIGH (ref 22–32)
CREATININE: 0.44 mg/dL (ref 0.44–1.00)
Calcium: 8.3 mg/dL — ABNORMAL LOW (ref 8.9–10.3)
GFR calc Af Amer: >60 mL/min (ref >60)
GFR calc non Af Amer: >60 mL/min (ref >60)
Glucose, Bld: 121 mg/dL — ABNORMAL HIGH (ref 70–99)
Potassium: 4 mmol/L (ref 3.5–5.1)
SODIUM: 144 mmol/L (ref 135–145)
Total Bilirubin: 0.6 mg/dL (ref 0.3–1.2)
Total Protein: 4.7 g/dL — ABNORMAL LOW (ref 6.5–8.1)

## 2018-05-20 LAB — GLUCOSE, CAPILLARY: Glucose-Capillary: 102 mg/dL — ABNORMAL HIGH (ref 70–99)

## 2018-05-20 LAB — MAGNESIUM: Magnesium: 2 mg/dL (ref 1.7–2.4)

## 2018-05-20 LAB — CBC
HCT: 41.7 % (ref 36.0–46.0)
Hemoglobin: 12.9 g/dL (ref 12.0–15.0)
MCH: 29.8 pg (ref 26.0–34.0)
MCHC: 30.9 g/dL (ref 30.0–36.0)
MCV: 96.3 fL (ref 80.0–100.0)
Platelets: 174 10*3/uL (ref 150–400)
RBC: 4.33 MIL/uL (ref 3.87–5.11)
RDW: 14.1 % (ref 11.5–15.5)
WBC: 12.7 10*3/uL — ABNORMAL HIGH (ref 4.0–10.5)
nRBC: 0 % (ref 0.0–0.2)

## 2018-05-20 MED ORDER — FOLIC ACID 1 MG PO TABS: 1.0000 mg | ORAL_TABLET | Freq: Every day | ORAL | 0 refills | Status: AC

## 2018-05-20 MED ORDER — GUAIFENESIN ER 600 MG PO TB12: 1200.0000 mg | ORAL_TABLET | Freq: Two times a day (BID) | ORAL | 0 refills | Status: AC

## 2018-05-20 MED ORDER — THIAMINE HCL 100 MG PO TABS: 100.0000 mg | ORAL_TABLET | Freq: Every day | ORAL | 0 refills | Status: AC

## 2018-05-20 MED ORDER — HYDROCODONE-HOMATROPINE 5-1.5 MG/5ML PO SYRP: 5.0000 mL | ORAL_SOLUTION | Freq: Four times a day (QID) | ORAL | 0 refills | Status: DC | PRN

## 2018-05-20 MED ORDER — PANTOPRAZOLE SODIUM 40 MG PO TBEC: 40.0000 mg | DELAYED_RELEASE_TABLET | Freq: Every day | ORAL | 0 refills | Status: DC

## 2018-05-20 MED ORDER — PREDNISONE 10 MG PO TABS: ORAL_TABLET | ORAL | 0 refills | Status: DC

## 2018-05-20 NOTE — Progress Notes (Signed)
Physical Therapy Treatment Patient Details Name: [name] MRN: [id number] DOB: 13-Nov-1945 Today's Date: 05/20/2018    History of Present Illness  72 y.o. female with medical history significant for COPD with chronic hypoxic respiratory failure, hypertension, and chronic bilateral leg swelling and ulcerations, admitted with  shortness of breath, acute on chronic respiratory failure with COPD exacerbation    PT Comments    Pt is progressing. Mildly unsteady at times. O2 sat 100% on 3L [place] at rest, 86% on 3L [place] O2 with a very long ambulation distance of ~250 feet on today. Will continue to follow and progress activity as tolerated.    Follow Up Recommendations  No PT follow up     Equipment Recommendations  None recommended by PT    Recommendations for Other Services       Precautions / Restrictions Precautions Precautions: Fall Precaution Comments: monitor sats  Restrictions Weight Bearing Restrictions: No    Mobility  Bed Mobility Overal bed mobility: Modified Independent                Transfers Overall transfer level: Modified independent                  Ambulation/Gait Ambulation/Gait assistance: Min guard Gait Distance (Feet): 250 Feet Assistive device: None Gait Pattern/deviations: Step-through pattern;Drifts right/left     General Gait Details: close guard for safety. intermittent unsteadiness. 2 brief standing rest breaks taken/needed. O2 sat 86% on 3L [place] O2, dyspnea 2/4.    Stairs             Wheelchair Mobility    Modified Rankin (Stroke Patients Only)       Balance Overall balance assessment: Mild deficits observed, not formally tested                                          Cognition Arousal/Alertness: Awake/alert Behavior During Therapy: WFL for tasks assessed/performed Overall Cognitive Status: Within Functional Limits for tasks assessed                                         Exercises      General Comments        Pertinent Vitals/Pain Pain Assessment: No/denies pain    Home Living                      Prior Function            PT Goals (current goals can now be found in the care plan section) Progress towards PT goals: Progressing toward goals    Frequency    Min 3X/week      PT Plan Current plan remains appropriate    Co-evaluation              AM-PAC PT "6 Clicks" Mobility   Outcome Measure  Help needed turning from your back to your side while in a flat bed without using bedrails?: None Help needed moving from lying on your back to sitting on the side of a flat bed without using bedrails?: None Help needed moving to and from a bed to a chair (including a wheelchair)?: None Help needed standing up from a chair using your arms (e.g., wheelchair or bedside chair)?: None Help needed to walk in hospital  room?: A Little Help needed climbing 3-5 steps with a railing? : A Little 6 Click Score: 22    End of Session Equipment Utilized During Treatment: Oxygen Activity Tolerance: Patient tolerated treatment well Patient left: in bed;with call bell/phone within reach   PT Visit Diagnosis: Unsteadiness on feet (R26.81)     Time: 5284-1324 PT Time Calculation (min) (ACUTE ONLY): 14 min  Charges:  $Gait Training: 8-22 mins                       Weston Anna, [place] Pager: [phone] Office: [phone]

## 2018-05-20 NOTE — Discharge Summary (Signed)
Discharge Summary  [name]:[id number] DOB: [birth year]  PCP: Christain Sacramento, MD  Admit date: 05/08/2018 Discharge date: 05/20/2018  Time spent: 35 mins  Recommendations for Outpatient Follow-up:  1. PCP in 1 week 2. Pulmonary in 1 week  Discharge Diagnoses:  Active Hospital Problems   Diagnosis Date Noted  . COPD with acute exacerbation ([place]) 05/09/2018  . Hyponatremia 05/09/2018  . Acute urinary retention 05/09/2018  . Chronic respiratory failure with hypoxia ([place]) 06/30/2014  . Essential hypertension 03/28/2012    Resolved Hospital Problems  No resolved problems to display.    Discharge Condition: Stable  Diet recommendation: Heart healthy  Vitals:   05/20/18 0830 05/20/18 1155  BP: (!) 144/72 118/87  Pulse: 90 84  Resp: (!) 21 (!) 22  Temp: 99.3 F (37.4 C) 98.6 F (37 C)  SpO2: 100% 99%    History of present illness:  [name] a 72 y.o.femalewith medical history significant forCOPD with chronic hypoxic respiratory failure, hypertension, and chronic bilateral leg swelling and ulcerations, now presenting to the emergency department for evaluation of shortness of breath and productive cough. Patient was seen in her pulmonology clinic on 05/03/2018 with productive cough and increased dyspnea, was diagnosed with acute exacerbation in COPD and started on prednisone and azithromycin. She failed to improve, was seen back on 05/06/2018 and reports that her prednisone course was extended. Unfortunately, she still has not had any significant improvement and may actually be worsening. She has been dyspneic at rest for the past couple days and has had difficulty sleeping due to this. She denies fevers, chills, or chest pain. Reports that her chronic bilateral lower extremity swelling is not as bad as usual. Pt admitted for further management   Today, pt reported feeling better, was able to ambulate with PT, with noted desaturations requiring home O2.  Pt advised to use home O2 constantly (at rest and ambulation), not just at bedtime. Pt discharged on a long taper of steroids and advised to follow up closely with PCP and pulmonary. Stable for d/c with [place] Hospital Course:  Principal Problem:   COPD with acute exacerbation ([employer]) Active Problems:   Essential hypertension   Chronic respiratory failure with hypoxia ([facility])   Hyponatremia   Acute urinary retention  Acute on chronic hypoxic respiratory failure with COPD Exacerbation -Presented with worsening shortness of breath, cough despite being treated with prednisone and azithromycin since 05/03/2018 - long hospital stay with slow improvement, admitted on 11/23 and required BIPAP at admission, and has completed course of abx with levaquin on 11/27.  She's been transitioned from IV solumedrol to PO prednisone -Continue duoneb, inhaler, tapered dose of prednisone -Completed course of Levaquin  -Continues to require supplemental O2 (3 L now, was previously on 2 L at night only) -CXR 11/30, showing improvement -Follow up with PCP and Dr Melvyn Novas (pulmonary)  Hyponatremia - resolved  Essential hypertension -Continue diltiazem, losartan  Alcohol use -Continue folic acid, thiamine, multivitamin   Leukocytosis  -likely due to steroids  Hyperglycemia -no history of diabetes  -hemoglobin A1c 6.1  Deconditioning -secondary to respiratory status/illness -PT evaluated patient, no further needs -OT recommended HH        Malnutrition Type:      Malnutrition Characteristics:      Nutrition Interventions:      Estimated body mass index is 19.56 kg/m as calculated from the following:   Height as of this encounter: _0  (1.575 m).   Weight as  of this encounter: 48.5 kg.    Procedures:  None  Consultations:  None  Discharge Exam: BP 118/87 (BP Location: Right Arm)   Pulse 84   Temp 98.6 F (37 C) (Oral)   Resp (!) 22   Ht _0  (1.575 m)   Wt 48.5 kg    SpO2 99%   BMI 19.56 kg/m   General: NAD Cardiovascular: S1, S2 present Respiratory: Mild bilateral wheezing noted bilaterally  Discharge Instructions You were cared for by a hospitalist during your hospital stay. If you have any questions about your discharge medications or the care you received while you were in the hospital after you are discharged, you can call the unit and asked to speak with the hospitalist on call if the hospitalist that took care of you is not available. Once you are discharged, your primary care physician will handle any further medical issues. Please note that NO REFILLS for any discharge medications will be authorized once you are discharged, as it is imperative that you return to your primary care physician (or establish a relationship with a primary care physician if you do not have one) for your aftercare needs so that they can reassess your need for medications and monitor your lab values.   Allergies as of 05/20/2018      Reactions   Tramadol Other (See Comments)   GI UPSET      Medication List    TAKE these medications   albuterol (2.5 MG/3ML) 0.083% nebulizer solution Commonly known as:  PROVENTIL Take 3 mLs (2.5 mg total) by nebulization every 6 (six) hours as needed for wheezing or shortness of breath. What changed:  Another medication with the same name was changed. Make sure you understand how and when to take each.   albuterol 108 (90 Base) MCG/ACT inhaler Commonly known as:  PROVENTIL HFA;VENTOLIN HFA 2 puffs every 4 hours as needed only  if your can't catch your breath What changed:    how much to take  how to take this  when to take this  reasons to take this   budesonide-formoterol 160-4.5 MCG/ACT inhaler Commonly known as:  SYMBICORT Inhale 2 puffs into the lungs 2 (two) times daily.   buPROPion 150 MG 12 hr tablet Commonly known as:  WELLBUTRIN SR Take 150 mg by mouth daily.   CAL MAG ZINC +D3 PO Take 1 tablet by mouth at  bedtime.   cholecalciferol 1000 units tablet Commonly known as:  VITAMIN D Take 1,000 Units by mouth daily.   diltiazem 360 MG 24 hr capsule Commonly known as:  CARDIZEM CD Take 360 mg by mouth daily.   FLUTTER Devi Use as directed   folic acid 1 MG tablet Commonly known as:  FOLVITE Take 1 tablet (1 mg total) by mouth daily. Start taking on:  05/21/2018   furosemide 20 MG tablet Commonly known as:  LASIX Take 20 mg by mouth daily as needed for fluid.   Garlic 076 MG Tabs Take 1 tablet by mouth daily.   guaiFENesin 600 MG 12 hr tablet Commonly known as:  MUCINEX Take 2 tablets (1,200 mg total) by mouth 2 (two) times daily.   HYDROcodone-acetaminophen 5-325 MG tablet Commonly known as:  NORCO/VICODIN Take 1 tablet by mouth every 4 (four) hours as needed. What changed:  reasons to take this   HYDROcodone-homatropine 5-1.5 MG/5ML syrup Commonly known as:  HYCODAN Take 5 mLs by mouth every 6 (six) hours as needed for cough.   losartan 100  MG tablet Commonly known as:  COZAAR Take 100 mg by mouth daily.   ondansetron 4 MG tablet Commonly known as:  ZOFRAN Take 1 tablet (4 mg total) by mouth every 8 (eight) hours as needed for nausea or vomiting.   OXYGEN Inhale 2 L into the lungs at bedtime.   pantoprazole 40 MG tablet Commonly known as:  PROTONIX Take 1 tablet (40 mg total) by mouth daily. Start taking on:  05/21/2018   potassium chloride 10 MEQ tablet Commonly known as:  K-DUR Take 10 mEq by mouth daily as needed (swelling). When takes lasix   predniSONE 10 MG tablet Commonly known as:  DELTASONE Take 4 tablets daily for 3 days, then 3 tablets daily for 3 days, the 2 tablets daily for 3 days and 1 tablet daily for 3 days and stop Start taking on:  05/21/2018 What changed:  additional instructions   thiamine 100 MG tablet Take 1 tablet (100 mg total) by mouth daily. Start taking on:  05/21/2018   Tiotropium Bromide Monohydrate 2.5 MCG/ACT Aers Inhale 2  puffs into the lungs daily.   vitamin C with rose hips 500 MG tablet Take 500 mg by mouth daily.            Durable Medical Equipment  (From admission, onward)         Start     Ordered   05/18/18 1221  For home use only DME oxygen  Once    Question Answer Comment  Mode or (Route) Nasal cannula   Liters per Minute 3   Frequency Continuous (stationary and portable oxygen unit needed)   Oxygen conserving device Yes   Oxygen delivery system Gas      05/18/18 1220         Allergies  Allergen Reactions  . Tramadol Other (See Comments)    GI UPSET   Follow-up Information    Christain Sacramento, MD. Schedule an appointment as soon as possible for a visit in 1 week(s).   Specialty:  Family Medicine Contact information: [address] [place] [address] [phone]        Tanda Rockers, MD. Schedule an appointment as soon as possible for a visit in 1 week(s).   Specialty:  Pulmonary Disease Contact information: [place] [address] [phone]            The results of significant diagnostics from this hospitalization (including imaging, microbiology, ancillary and laboratory) are listed below for reference.    Significant Diagnostic Studies: Dg Chest Port 1 View  Result Date: 05/15/2018 CLINICAL DATA:  Initial evaluation for acute shortness of breath. EXAM: PORTABLE CHEST 1 VIEW COMPARISON:  Prior radiograph from 05/14/2018 FINDINGS: Mild cardiomegaly, stable. Mediastinal silhouette within normal limits. Aortic atherosclerosis. Lungs are hyperinflated with underlying emphysematous changes. Previously seen left basilar opacity is improved, with minimal residual linear densities overlying the left hemidiaphragm, likely atelectasis. Additional streaky right basilar opacities favored to reflect atelectatic changes as well. No new focal infiltrates. Possible trace right pleural effusion. No pulmonary edema. No pneumothorax. No acute  osseous abnormality. Osteopenia. IMPRESSION: 1. Interval improvement in previously seen left basilar opacity, with minimal residual left basilar atelectasis. 2. Possible trace right pleural effusion. 3. Emphysema. Electronically Signed   By: Jeannine Boga M.D.   On: 05/15/2018 05:56   Dg Chest Port 1 View  Result Date: 05/14/2018 CLINICAL DATA:  Shortness of breath. EXAM: PORTABLE CHEST 1 VIEW COMPARISON:  05/13/2018.  09/24/2016.  09/14/2015.  06/15/2015. FINDINGS: Mediastinum and hilar structures are stable. Heart size stable. Mild peripheral left base atelectasis/infiltrate. Pulmonary infarct cannot be excluded. Bullous COPD. Chronic interstitial changes. Right upper lobe pleural-parenchymal thickening again noted consistent scarring. Stable basal pleural thickening consistent with scarring. No pneumothorax. IMPRESSION: 1. Mild left base atelectasis/infiltrate. Pulmonary infarct cannot be excluded. 2. Bullous COPD. Stable chronic interstitial changes and right upper lobe pleural-parenchymal thickening consistent with scarring. Stable bibasal pleural thickening consistent scarring. Electronically Signed   By: Marcello Moores  Register   On: 05/14/2018 07:01   Dg Chest Port 1 View  Result Date: 05/13/2018 CLINICAL DATA:  72 year old female with suspected COPD exacerbation earlier this month but failed to improve on medication. Acute on chronic respiratory failure. EXAM: PORTABLE CHEST 1 VIEW COMPARISON:  05/11/2018 and earlier. FINDINGS: Portable AP semi upright view at 0426 hours. Lower lung volumes. Stable cardiac size and mediastinal contours. Evidence of emphysema. No pneumothorax, definite pleural effusion, or confluent pulmonary opacity. Stable cardiac size and mediastinal contours. Visualized tracheal air column is within normal limits. Paucity bowel gas in the upper abdomen. IMPRESSION: Chronic lung disease with lower lung volumes today but no definite acute cardiopulmonary abnormality.  Electronically Signed   By: Genevie Ann M.D.   On: 05/13/2018 07:28   Dg Chest Port 1 View  Result Date: 05/11/2018 CLINICAL DATA:  Shortness of breath EXAM: PORTABLE CHEST 1 VIEW COMPARISON:  05/08/2018 FINDINGS: Cardiac shadow is stable. Aortic calcifications are again seen. Lungs are hyperinflated bilaterally. Mild fibrotic changes are again identified. No focal confluent infiltrate or sizable effusion is seen. IMPRESSION: COPD chronic fibrotic changes without acute abnormality. Electronically Signed   By: Inez Catalina M.D.   On: 05/11/2018 08:49   Dg Chest Port 1 View  Result Date: 05/08/2018 CLINICAL DATA:  Worsening shortness of breath and cough. Previous smoker. History of hypertension. EXAM: PORTABLE CHEST 1 VIEW COMPARISON:  02/03/2018 FINDINGS: Cardiac enlargement. No vascular congestion, edema, or consolidation. Diffuse emphysematous changes in the lungs. Scattered fibrosis. No blunting of costophrenic angles. No pneumothorax. Mediastinal contours appear intact. IMPRESSION: Cardiac enlargement. Emphysematous changes and fibrosis in the lungs. No evidence of active pulmonary disease. Electronically Signed   By: Lucienne Capers M.D.   On: 05/08/2018 21:59    Microbiology: No results found for this or any previous visit (from the past 240 hour(s)).   Labs: Basic Metabolic Panel: Recent Labs  Lab 05/14/18 0433 05/15/18 0502 05/16/18 0418 05/17/18 0446 05/19/18 0450 05/20/18 0512  NA 141 141  --  144 145 144  K 4.0 4.3  --  3.7 4.1 4.0  CL 98 97*  --  104 104 106  CO2 35* 35*  --  33* 35* 34*  GLUCOSE 186* 185*  --  138* 98 121*  BUN 37* 35*  --  31* 25* 31*  CREATININE 0.47 0.49 0.43* 0.49 0.40* 0.44  CALCIUM 8.6* 8.5*  --  8.3* 8.5* 8.3*  MG 2.1 2.2  --   --   --  2.0  PHOS 3.7 3.4  --   --   --   --    Liver Function Tests: Recent Labs  Lab 05/14/18 0433 05/15/18 0502 05/17/18 0446 05/20/18 0512  AST _0 ALT 24 31 42 57*  ALKPHOS 33* 39 33* 37*  BILITOT  0.6 1.6* 0.6 0.6  PROT 5.2* 5.2* 4.9* 4.7*  ALBUMIN 2.9* 3.0* 2.7* 2.7*   No results for input(s): LIPASE, AMYLASE in the last 168  hours. No results for input(s): AMMONIA in the last 168 hours. CBC: Recent Labs  Lab 05/14/18 0433 05/15/18 0502 05/19/18 0450 05/20/18 0512  WBC 9.9 12.9* 10.7* 12.7*  NEUTROABS 9.2* 11.9*  --   --   HGB 14.0 14.2 14.2 12.9  HCT 45.7 45.4 46.2* 41.7  MCV 92.7 92.5 93.9 96.3  PLT 239 267 197 174   Cardiac Enzymes: No results for input(s): CKTOTAL, CKMB, CKMBINDEX, TROPONINI in the last 168 hours. BNP: BNP (last 3 results) No results for input(s): BNP in the last 8760 hours.  ProBNP (last 3 results) No results for input(s): PROBNP in the last 8760 hours.  CBG: Recent Labs  Lab 05/16/18 0757 05/17/18 0749 05/18/18 0758 05/19/18 0804 05/20/18 0743  GLUCAP 165* 94 122* 134* 102*       Signed:  Alma Friendly, MD [facility] 05/20/2018, 12:08 PM

## 2018-05-20 NOTE — Care Management Important Message (Signed)
Important Message  Patient Details  Name: KEIRRA ZEIMET MRN: [id number] Date of Birth: [birth year]   Medicare Important Message Given:  Yes    Kerin Salen 05/20/2018, 11:40 AMImportant Message  Patient Details  Name: MIKEL PYON MRN: [id number] Date of Birth: 1946-04-04   Medicare Important Message Given:  Yes    Kerin Salen 05/20/2018, 11:40 AM

## 2018-06-01 ENCOUNTER — Ambulatory Visit (INDEPENDENT_AMBULATORY_CARE_PROVIDER_SITE_OTHER): Payer: Medicare Other | Admitting: Internal Medicine

## 2018-06-01 ENCOUNTER — Encounter: Payer: Self-pay | Admitting: Internal Medicine

## 2018-06-01 VITALS — BP 104/58 | HR 97 | Ht 62.0 in | Wt 111.0 lb

## 2018-06-01 DIAGNOSIS — J449 Chronic obstructive pulmonary disease, unspecified: Secondary | ICD-10-CM | POA: Diagnosis not present

## 2018-06-01 DIAGNOSIS — I2781 Cor pulmonale (chronic): Secondary | ICD-10-CM | POA: Diagnosis not present

## 2018-06-01 DIAGNOSIS — J9611 Chronic respiratory failure with hypoxia: Secondary | ICD-10-CM | POA: Diagnosis not present

## 2018-06-01 DIAGNOSIS — J9612 Chronic respiratory failure with hypercapnia: Secondary | ICD-10-CM | POA: Diagnosis not present

## 2018-06-01 MED ORDER — TIOTROPIUM BROMIDE MONOHYDRATE 2.5 MCG/ACT IN AERS: 2.0000 | INHALATION_SPRAY | Freq: Every day | RESPIRATORY_TRACT | 0 refills | Status: DC

## 2018-06-01 NOTE — Patient Instructions (Addendum)
3lpm at bedtime, none at rest and turn up your 02 to keep it above 90% with activity     Plan A = Automatic = symbicort and spiriva   Plan B = Backup Only use your albuterol inhaler as a rescue medication to be used if you can't catch your breath by resting or doing a relaxed purse lip breathing pattern.  - The less you use it, the better it will work when you need it. - Ok to use the inhaler up to 2 puffs  every 4 hours if you must but call for appointment if use goes up over your usual need - Don't leave home without it !!  (think of it like the spare tire for your car)   Plan C = Crisis - only use your albuterol nebulizer if you first try Plan B and it fails to help > ok to use the nebulizer up to every 4 hours but if start needing it regularly call for immediate appointment  For cough/ congestion >  mucinex or mucinex dm up to 1200 mg every 12 hours with flutter valve   Work on inhaler technique:  relax and gently blow all the way out then take a nice smooth deep breath back in, triggering the inhaler at same time you start breathing in.  Hold for up to 5 seconds if you can. Blow out thru nose. Rinse and gargle with water when done   If not improving >  Prednisone 10 mg take  4 each am x 2 days,   2 each am x 2 days,  1 each am x 2 days and stop      Please schedule a follow up office visit in 6 weeks, call sooner if needed to see Tammy in meantime if not improving  - if worse swelling consider aldactone  - late add needs echo for ? Cor pulmonale

## 2018-06-01 NOTE — Progress Notes (Signed)
Subjective:   Patient ID: [name], female    DOB: 10-04-1945   MRN: [id number]  Brief patient profile:  67 yowf quit smoking 03/2017  followed in pulmonary clinic for GOLD III copd   History of Present Illness  11/05/2015  f/u ov/[name] re: GOLD III / still smoking / maint rx symb/spriiva dpi  Chief Complaint  Patient presents with  . Follow-up    pt states breathing is baseline. c/o cont SOB, prod cough tan to greenish in color, wheezing, cp/tightness  dyspnea is variable and has portable tanks but never uses them to see if makes any difference in her variable doe / using saba up to 4 x daily instead.   rec You are qualified for portable 02 if you wish but no need to wear it at rest or walking around the house - continue it at bedtime thru the concentrator at 2lpm and wear the portable system as needed during the day Ok to finish up the powder spiriva and just refill the respimat    12/24/2016  f/u ov/[name] re:  COPD GOLD III/ still smoking/ symb/spiriva / 02 2lpm hs only  Chief Complaint  Patient presents with  . Follow-up    Pt states she is still having increase sob with exerton, she still has a slight cough with clear mucus with a tint of blood, lots of wheezing,Denies chest tightness,fever   using saba hfa up to twice daily but no needing noct Doe = MMRC3 = can't walk 100 yards even at a slow pace at a flat grade s stopping due to sob   Has slt bloody nasal discharge in am and scant blood streaked sputum also, just in am and just when nose is bleeding too    11/11/2017  f/u ov/[name] re:  GOLD III/ 02 hs prn  Chief Complaint  Patient presents with  . Follow-up    Breathing is about the same. She is using her albuterol inhaler 2-3 x per wk on average and she rarely uses neb.   Dyspnea:   No change in doe = MMRC3  Cough: not much at all  Sleep: flat on 2lpm  SABA use:  As above Sleeping  Ok on 2lpm flat   rec Discuss the fluid pills with yDr Redmond Pulling but I will recommend  he consider adding aldactone if not improving on lasix alone but have to be very careful with potassium monitoring with these changes so I will defer to him  Please schedule a follow up visit in 6 months but call sooner if needed - referred to rehab    02/03/2018  f/u ov/[name] re:  GOLD III/ 02 hs only  - pre op clearance  Chief Complaint  Patient presents with  . Follow-up    sx clearance for bladder tack on 8/29.  Pt states her breathing is at baseline.    Dyspnea:  MMRC2 = can't walk a nl pace on a flat grade s sob but does fine slow and flat eg foodlion s 02  Cough: none Sleeping: 2lpm 1-2 pillows ok /min am congestion  SABA use: neither or hfa or neb  02: 2lpm hs only   rec Work on inhaler technique:  relax and gently blow all the way out then take a nice smooth deep breath back in, triggering the inhaler at same time you start breathing in.  Hold for up to 5 seconds if you can. Blow symbicort out thru nose. Rinse and gargle with water when done  Use your nebulizer the morning of your surgery    05/03/18 acute ov/ NP aecopd rec Seen today for COPD exacerbation secondary to URI Office treatment: Xopenex neb treatment x1 today Rx: Prednisone taper as prescribed Z-Pak as prescribed Recommendations: Used Albuterol nebulizer every 6 hours as needed for shortness of breath/wheezing for the next 3 to 5 days until better You can return to work as long as you are afebrile for 24 hours Stay well-hydrated, encourage 4-6 glass of fluids (8 oz)    05/06/2018 acute extended ov/[name] re: sob/cough / did not bring meds not using flutter  Chief Complaint  Patient presents with  . Follow-up    was here   11/18 for SOB and cough.  Not doing any better.    Usual state of healthy until 11/15 severe cough never productive assoc with scratchy throat / watery rhinits   sleeping on 2lpm flat/ no worse in am but worse as day goes on and does not feel neb helps as much as hfa but extremely  technique  poor hfa technique  Sob at rest / reports prefers hfa over neb when can't catch her breath rec For cough >  mucinex dm  1200 mg every 12hours with the flutter valve and supplement with oxycodone or hydrocodone every 4 hours and as long as you are coughing > Try prilosec otc 58m  Take 30-60 min before first meal of the day and Pepcid ac (famotidine) 20 mg one @  bedtime until cough is completely gone for at least a week without the need for cough suppression Finish zpak  Prednisone 10 mg Take 4 for two days three for two days two for two days one for two days  Work on inhaler technique: GERD (REFLUX) If not improving you will need to go to ER > admit  Admit date: 05/08/2018 Discharge date: 05/20/2018   Discharge Diagnoses:      Active Hospital Problems   Diagnosis Date Noted  . COPD with acute exacerbation ([facility] 05/09/2018  . Hyponatremia 05/09/2018  . Acute urinary retention 05/09/2018  . Chronic respiratory failure with hypoxia ([facility] 06/30/2014  . Essential hypertension 03/28/2012    History of present illness:  Crystal VallelyPippinis a 72y.o.femalewith medical history significant forCOPD with chronic hypoxic respiratory failure, hypertension, and chronic bilateral leg swelling and ulcerations, now presenting to the emergency department for evaluation of shortness of breath and productive cough. Patient was seen in her pulmonology clinic on 05/03/2018 with productive cough and increased dyspnea, was diagnosed with acute exacerbation in COPD and started on prednisone and azithromycin. She failed to improve, was seen back on 05/06/2018 and reports that her prednisone course was extended. Unfortunately, she still has not had any significant improvement and may actually be worsening. She has been dyspneic at rest for the past couple days and has had difficulty sleeping due to this. She denies fevers, chills, or chest pain. Reports that her chronic bilateral lower extremity swelling  is not as bad as usual. Pt admitted for further management   Today, pt reported feeling better, was able to ambulate with PT, with noted desaturations requiring home O2. Pt advised to use home O2 constantly (at rest and ambulation), not just at bedtime. Pt discharged on a long taper of steroids and advised to follow up closely with PCP and pulmonary. [facility]  Principal Problem:   COPD with acute exacerbation ([facility] Active Problems:   Essential hypertension  Chronic respiratory failure with hypoxia (HCC)   Hyponatremia   Acute urinary retention  Acute on chronic hypoxic respiratory failure with COPD Exacerbation -Presented with worsening shortness of breath, cough despite being treated with prednisone and azithromycin since 05/03/2018 - long hospital stay with slow improvement, admitted on 11/23 and required BIPAP at admission, and has completed course of abx with levaquin on 11/27. She's been transitioned from IV solumedrol to PO prednisone -Continue duoneb, inhaler, tapered dose of prednisone -Completed course of Levaquin  -Continues to require supplemental O2 (3 L now, was previously on 2 L at night only) -CXR 11/30, showing improvement -Follow up with PCP and Dr Melvyn Novas (pulmonary)  Hyponatremia -resolved  Essential hypertension -Continue diltiazem, losartan  Alcohol use -Continue folic acid, thiamine, multivitamin   Leukocytosis  -likely due to steroids  Hyperglycemia -no history of diabetes  -hemoglobin A1c 6.1  Deconditioning -secondary to respiratory status/illness -PT evaluated patient, no further needs -OT recommended [facility]     06/01/2018  Post hosp f/u ov/[name] re: transition of care:   Aecopd/ 02 dep 24/7  Chief Complaint  Patient presents with  . Hospitalization Follow-up    Breathing has improved some but not back to her normal baseline. She is using her o2 3lpm 24/7.  She has not used her albuterol inhaler  but has been using her albuterol neb once daily on average.   Dyspnea:  MMRC4  = sob if tries to leave home or while getting dressed   Cough: some rattling  Sleeping: horizontal on back ok SABA use: avg neb once  02: 3lpm 24/7    No obvious day to day or daytime variability or assoc excess/ purulent sputum or mucus plugs or hemoptysis or cp or chest tightness, subjective wheeze or overt sinus or hb symptoms.   Sleeping as above  without nocturnal  or early am exacerbation  of respiratory  c/o's or need for noct saba. Also denies any obvious fluctuation of symptoms with weather or environmental changes or other aggravating or alleviating factors except as outlined above   No unusual exposure hx or h/o childhood pna/ asthma or knowledge of premature birth.  Current Allergies, Complete Past Medical History, Past Surgical History, Family History, and Social History were reviewed in [employer] record.  ROS  The following are not active complaints unless bolded Hoarseness, sore throat, dysphagia, dental problems, itching, sneezing,  nasal congestion or discharge of excess mucus or purulent secretions, ear ache,   fever, chills, sweats, unintended wt loss or wt gain, classically pleuritic or exertional cp,  orthopnea pnd or arm/hand swelling  or leg swelling already on rx per Dr Redmond Pulling but "not working" , presyncope, palpitations, abdominal pain, anorexia, nausea, vomiting, diarrhea  or change in bowel habits or change in bladder habits, change in stools or change in urine, dysuria, hematuria,  rash, arthralgias, visual complaints, headache, numbness, weakness or ataxia or problems with walking or coordination,  change in mood or  memory.        Current Meds  Medication Sig  . albuterol (PROAIR HFA) 108 (90 Base) MCG/ACT inhaler 2 puffs every 4 hours as needed only  if your can't catch your breath (Patient taking differently: Inhale 2 puffs into the lungs every 4 (four) hours  as needed (if unable to catch breath). 2 puffs every 4 hours as needed only  if your can't catch your breath)  . albuterol (PROVENTIL) (2.5 MG/3ML) 0.083% nebulizer solution Take 3 mLs (2.5 mg total) by nebulization  every 6 (six) hours as needed for wheezing or shortness of breath.  . Ascorbic Acid (VITAMIN C WITH ROSE HIPS) 500 MG tablet Take 500 mg by mouth daily.  . budesonide-formoterol (SYMBICORT) 160-4.5 MCG/ACT inhaler Inhale 2 puffs into the lungs 2 (two) times daily.  Marland Kitchen buPROPion (WELLBUTRIN SR) 150 MG 12 hr tablet Take 150 mg by mouth daily.   . cephALEXin (KEFLEX) 500 MG capsule Take 500 mg by mouth 3 (three) times daily.  . cholecalciferol (VITAMIN D) 1000 UNITS tablet Take 1,000 Units by mouth daily.  Marland Kitchen diltiazem (CARDIZEM CD) 360 MG 24 hr capsule Take 360 mg by mouth daily.   . folic acid (FOLVITE) 1 MG tablet Take 1 tablet (1 mg total) by mouth daily.  . furosemide (LASIX) 20 MG tablet Take 20 mg by mouth daily.   . Garlic 793 MG TABS Take 1 tablet by mouth daily.  Marland Kitchen guaiFENesin (MUCINEX) 600 MG 12 hr tablet Take 2 tablets (1,200 mg total) by mouth 2 (two) times daily.  Marland Kitchen HYDROcodone-acetaminophen (NORCO/VICODIN) 5-325 MG tablet Take 1 tablet by mouth every 4 (four) hours as needed. (Patient taking differently: Take 1 tablet by mouth every 4 (four) hours as needed for moderate pain or severe pain. )  . HYDROcodone-homatropine (HYCODAN) 5-1.5 MG/5ML syrup Take 5 mLs by mouth every 6 (six) hours as needed for cough.  . losartan (COZAAR) 100 MG tablet Take 100 mg by mouth daily.  . Multiple Minerals (CALCIUM-MAGNESIUM-ZINC) TABS Take 1 tablet by mouth daily.  . Multiple Minerals-Vitamins (CAL MAG ZINC +D3 PO) Take 1 tablet by mouth at bedtime.  . ondansetron (ZOFRAN) 4 MG tablet Take 1 tablet (4 mg total) by mouth every 8 (eight) hours as needed for nausea or vomiting.  . OXYGEN Inhale 3 L into the lungs. 24/7 DME- [employer]  . pantoprazole (PROTONIX) 40 MG tablet Take 1 tablet (40 mg  total) by mouth daily.  . potassium chloride (K-DUR) 10 MEQ tablet Take 10 mEq by mouth daily as needed (swelling). When takes lasix   . predniSONE (DELTASONE) 10 MG tablet Take 4 tablets daily for 3 days, then 3 tablets daily for 3 days, the 2 tablets daily for 3 days and 1 tablet daily for 3 days and stop  . Respiratory Therapy Supplies (FLUTTER) DEVI Use as directed  . thiamine 100 MG tablet Take 1 tablet (100 mg total) by mouth daily.  . Tiotropium Bromide Monohydrate (SPIRIVA RESPIMAT) 2.5 MCG/ACT AERS Inhale 2 puffs into the lungs daily.               Objective:   Physical Exam   amb wf / rattling cough   Vital signs reviewed - Note on arrival 02 sats  100% on 3lpm continuous        09/26/2013  101>102 10/11/2013 > 01/16/2014  99 > 04/06/2014  103 >105 06/30/2014 > 08/11/2014  101 > 11/09/2014  104 > 01/22/2015 106  > 02/06/2015 105 > 03/08/2015 106 > 04/18/2015  106 > 06/15/2015 103 > 09/14/2015   102 > 10/08/2015   103 > 11/05/2015  102 > 02/05/2016  103 > 05/14/2016 102 > 09/24/2016  99 > 12/24/2016  99 > 06/29/2017  104 > 09/28/2017  106 > 11/11/2017 104 > 02/03/2018  107 > 05/06/2018   105 > 06/01/2018  111    HEENT: nl dentition / oropharynx. Nl external ear canals without cough reflex -  Mild bilateral non-specific turbinate edema     NECK :  without  JVD/Nodes/TM/ nl carotid upstrokes bilaterally   LUNGS: no acc muscle use,  Mod barrel  contour chest wall with bilateral  Distant distant wheezing better with plm  and  without cough on insp or exp maneuver and mod  Hyperresonant  to  percussion bilaterally     CV:  RRR  no s3 or murmur or increase in P2, and bilateral LE pitting edema R> L despite leg wrappings  ABD:  soft and nontender with pos mid insp Hoover's  in the supine position. No bruits or organomegaly appreciated, bowel sounds nl  MS:   Nl gait/  ext warm without deformities, calf tenderness, cyanosis or clubbing No obvious joint restrictions   SKIN: warm and dry without  lesions    NEURO:  alert, approp, nl sensorium with  no motor or cerebellar deficits apparent.                   Assessment & Plan:

## 2018-06-02 ENCOUNTER — Encounter: Payer: Self-pay | Admitting: Internal Medicine

## 2018-06-02 DIAGNOSIS — J9621 Acute and chronic respiratory failure with hypoxia: Secondary | ICD-10-CM | POA: Insufficient documentation

## 2018-06-02 DIAGNOSIS — J9612 Chronic respiratory failure with hypercapnia: Secondary | ICD-10-CM

## 2018-06-02 DIAGNOSIS — J9611 Chronic respiratory failure with hypoxia: Secondary | ICD-10-CM | POA: Insufficient documentation

## 2018-06-02 NOTE — Assessment & Plan Note (Signed)
I do not see that she has had an echocardiogram but she is acting like a patient with cor pulmonale and would probably benefit from the addition of Aldactone but since Dr. Redmond Pulling is already treating her with Lasix I prefer all the diuretics be arranged through Dr. Dois Davenport service unless otherwise requested by Dr. Redmond Pulling.    I had an extended discussion with the patient reviewing all relevant studies completed to date and  lasting 15 to 20 minutes of a 25 minute post hosp f/u office visit / transition of care   See device teaching which extended face to face time for this visit.  Each maintenance medication was reviewed in detail including emphasizing most importantly the difference between maintenance and prns and under what circumstances the prns are to be triggered using an action plan format that is not reflected in the computer generated alphabetically organized AVS which I have not found useful in most complex patients, especially with respiratory illnesses  Please see AVS for specific instructions unique to this visit that I personally wrote and verbalized to the the pt in detail and then reviewed with pt  by my nurse highlighting any  changes in therapy recommended at today's visit to their plan of care.

## 2018-06-02 NOTE — Assessment & Plan Note (Signed)
Exertional desats 06/30/2014 >O2 2lm rx with act  - 11/09/2014  Walked RA  2 laps @ 185 ft each stopped due to  Sat down to 88 % at nl pace but no sob  - ono RA  09/22/15  desat < 89% x 1:62mn > rec continue 02 2lpm - 43/60/6770certification: Patient Saturations on Room Air at Rest = 94% and while Ambulating 2 laps x 185 each= 87% Patient Saturations on 2Liters of oxygen while Ambulating =96% x one additional lap - 11/05/2015  Walked RA x 4 laps @ 185 ft each stopped due to  desat to 87% resolved on 2lpm   - 06/29/2017  Walked RA x 3 laps @ 185 ft each stopped due to  End of study/ slow pace, no desat - 11/11/2017  Walked RA x 3 laps @ 185 ft each stopped due to  End of study,  no  desat  < 90% at moderate to moderately  fast pace    - HCO3  05/20/18  = 34  See resp failure /hypercarbic/hypoxemic   As of 06/01/2018 rec 3lpm 24/7   Though somewhat paradoxic, when the lung fails to clear C02 properly and pC02 rises the lung then becomes a more efficient scavenger of C02 allowing lower work of breathing and  better C02 clearance albeit at a higher serum pC02 level - this is why pts can look a lot better than their ABG's would suggest and why it's so difficult to prognosticate endstage dz.  It's also why I strongly rec DNI status (ventilating pts down to a nl pC02 adversely affects this compensatory mechanism)

## 2018-06-02 NOTE — Assessment & Plan Note (Signed)
pfts 11/2011   FEV1 0.86 (42%) ratio 35  - alpha one AT screening 03/24/12   MM  Level 166  - 09/14/2015  hfa Arlana Pouch 11/05/2015 (when spiriva dpi supply runs out) - flutter valve added 02/05/2016  And recoached 05/14/2016  - PFT's  06/29/2017  FEV1 0.92 (44 % ) ratio 44  p 11 % improvement from saba p symb/spiriva prior to study with DLCO  33/33 % corrects to 35 % for alv volume   - 11/11/2017 referred to rehab   05/03/2018 - COPD exac d/t URI, RX zpack and prednisone taper > failed outpt rx and admitted   - 06/01/2018  After extensive coaching inhaler device,  effectiveness =    75% from a baseline of 50% (short Ti)     Group D in terms of symptom/risk and laba/lama/ICS  therefore appropriate rx at this point but I am not confident that she is using her medications correctly and might do better on account the same combination per nebulizer that is now available if she has adequate part B coverage with a supplement.  In the meantime I advised her to keep prednisone on hand for exacerbations see avs for instructions unique to this ov

## 2018-06-11 ENCOUNTER — Telehealth: Payer: Self-pay | Admitting: Internal Medicine

## 2018-06-11 NOTE — Telephone Encounter (Signed)
Pt states she was wanting to know if she would be eligable to get POC at [facility]. Pt aware she will need to call [place] and see what her options are with her insurance and length of time she has been in [employer], research (medical) with [place]. Pt verbalized her understanding and will contact our office if any further questions or concerns.

## 2018-06-11 NOTE — Telephone Encounter (Signed)
Called patient, unable to reach left message to give [place] a call back.

## 2018-06-11 NOTE — Telephone Encounter (Signed)
Pt is calling back [phone]

## 2018-06-18 ENCOUNTER — Other Ambulatory Visit: Payer: Self-pay

## 2018-06-18 DIAGNOSIS — R609 Edema, unspecified: Secondary | ICD-10-CM

## 2018-06-18 DIAGNOSIS — L03818 Cellulitis of other sites: Secondary | ICD-10-CM

## 2018-07-09 ENCOUNTER — Telehealth: Payer: Self-pay | Admitting: Internal Medicine

## 2018-07-09 NOTE — Telephone Encounter (Signed)
Pt states she was told by [employer] that her Insurance is NOT covering her O2 and needs to have another walk showing she needs O2. Pt is aware that I will add notes to her OV on 07-14-2018 to be retested for O2 need and send to [place]. Nothing more needed at this time.

## 2018-07-14 ENCOUNTER — Encounter: Payer: Self-pay | Admitting: Internal Medicine

## 2018-07-14 ENCOUNTER — Telehealth: Payer: Self-pay | Admitting: Internal Medicine

## 2018-07-14 ENCOUNTER — Ambulatory Visit (INDEPENDENT_AMBULATORY_CARE_PROVIDER_SITE_OTHER): Payer: Medicare Other | Admitting: Internal Medicine

## 2018-07-14 VITALS — BP 104/64 | HR 98 | Ht 62.0 in | Wt 110.0 lb

## 2018-07-14 DIAGNOSIS — J9612 Chronic respiratory failure with hypercapnia: Secondary | ICD-10-CM

## 2018-07-14 DIAGNOSIS — J9611 Chronic respiratory failure with hypoxia: Secondary | ICD-10-CM | POA: Diagnosis not present

## 2018-07-14 DIAGNOSIS — I2781 Cor pulmonale (chronic): Secondary | ICD-10-CM | POA: Diagnosis not present

## 2018-07-14 DIAGNOSIS — J449 Chronic obstructive pulmonary disease, unspecified: Secondary | ICD-10-CM | POA: Diagnosis not present

## 2018-07-14 MED ORDER — TIOTROPIUM BROMIDE MONOHYDRATE 2.5 MCG/ACT IN AERS: 2.0000 | INHALATION_SPRAY | Freq: Every day | RESPIRATORY_TRACT | 0 refills | Status: DC

## 2018-07-14 MED ORDER — BUDESONIDE-FORMOTEROL FUMARATE 160-4.5 MCG/ACT IN AERO: 2.0000 | INHALATION_SPRAY | Freq: Two times a day (BID) | RESPIRATORY_TRACT | 0 refills | Status: DC

## 2018-07-14 MED ORDER — PREDNISONE 10 MG PO TABS: ORAL_TABLET | ORAL | 0 refills | Status: DC

## 2018-07-14 NOTE — Assessment & Plan Note (Addendum)
Exertional desats 06/30/2014 >O2 2lm rx with act  - 11/09/2014  Walked RA  2 laps @ 185 ft each stopped due to  Sat down to 88 % at nl pace but no sob  - ono RA  09/22/15  desat < 89% x 1:7mn > rec continue 02 2lpm - 45/39/1225certification: Patient Saturations on Room Air at Rest = 94% and while Ambulating 2 laps x 185 each= 87% Patient Saturations on 2Liters of oxygen while Ambulating =96% x one additional lap - 11/05/2015  Walked RA x 4 laps @ 185 ft each stopped due to  desat to 87% resolved on 2lpm   - 06/29/2017  Walked RA x 3 laps @ 185 ft each stopped due to  End of study/ slow pace, no desat - 11/11/2017  Walked RA x 3 laps @ 185 ft each stopped due to  End of study,  no  desat  < 90% at moderate to moderately  fast pace   - HCO3  05/20/18  = 34    - 07/14/2018 Room Air at Rest = 96% Patient Saturations on [place] = 88% Patient Saturations on 3 Liters of oxygen while Ambulating = 95%  As of 07/14/2018  2lpm hs > needs ono on 2lpm   3lpm walking more than around the house with goal of > 90% at all times   Reviewed goals of 02 given cor pulmonale  She is using 02 and appearing to be benefiting from it.

## 2018-07-14 NOTE — Telephone Encounter (Signed)
Spoke with the pharmacist, [place] and notified pred is 10 mg- 2 x daily until better, then 1 daily x 3 days and then stop  Nothing further needed

## 2018-07-14 NOTE — Assessment & Plan Note (Addendum)
Adequate control on present rx, reviewed in detail with pt > no change in rx needed     I had an extended discussion with the patient reviewing all relevant studies completed to date and  lasting 15 to 20 minutes of a 25 minute visit    See device teaching which extended face to face time for this visit. Also directly observed 02 saturation study  Each maintenance medication was reviewed in detail including emphasizing most importantly the difference between maintenance and prns and under what circumstances the prns are to be triggered using an action plan format that is not reflected in the computer generated alphabetically organized AVS which I have not found useful in most complex patients, especially with respiratory illnesses  Please see AVS for specific instructions unique to this visit that I personally wrote and verbalized to the the pt in detail and then reviewed with pt  by my nurse highlighting any  changes in therapy recommended at today's visit to their plan of care.

## 2018-07-14 NOTE — Patient Instructions (Addendum)
Please see patient coordinator before you leave today  to schedule overnight 02 on 2lpm   When walking be sure for a given activity like shopping at a comfortable pace that your 02 level stays above 90%   Please schedule a follow up visit in 6 months but call sooner if needed

## 2018-07-14 NOTE — Progress Notes (Signed)
Subjective:   Patient ID: [name], female    DOB: 12/26/1945   MRN: [id number]  Brief patient profile:  62 yowf MM quit smoking 03/2017  followed in pulmonary clinic for GOLD III copd   History of Present Illness  11/05/2015  f/u ov/Breslyn Abdo re: GOLD III / still smoking / maint rx symb/spriiva dpi  Chief Complaint  Patient presents with  . Follow-up    pt states breathing is baseline. c/o cont SOB, prod cough tan to greenish in color, wheezing, cp/tightness  dyspnea is variable and has portable tanks but never uses them to see if makes any difference in her variable doe / using saba up to 4 x daily instead.   rec You are qualified for portable 02 if you wish but no need to wear it at rest or walking around the house - continue it at bedtime thru the concentrator at 2lpm and wear the portable system as needed during the day Ok to finish up the powder spiriva and just refill the respimat    12/24/2016  f/u ov/[name] re:  COPD GOLD III/ still smoking/ symb/spiriva / 02 2lpm hs only  Chief Complaint  Patient presents with  . Follow-up    Pt states she is still having increase sob with exerton, she still has a slight cough with clear mucus with a tint of blood, lots of wheezing,Denies chest tightness,fever   using saba hfa up to twice daily but no needing noct Doe = MMRC3 = can't walk 100 yards even at a slow pace at a flat grade s stopping due to sob   Has slt bloody nasal discharge in am and scant blood streaked sputum also, just in am and just when nose is bleeding too    11/11/2017  f/u ov/[name] re:  GOLD III/ 02 hs prn  Chief Complaint  Patient presents with  . Follow-up    Breathing is about the same. She is using her albuterol inhaler 2-3 x per wk on average and she rarely uses neb.   Dyspnea:   No change in doe = MMRC3  Cough: not much at all  Sleep: flat on 2lpm  SABA use:  As above Sleeping  Ok on 2lpm flat   rec Discuss the fluid pills with  Dr Redmond Pulling but I will  recommend he consider adding aldactone if not improving on lasix alone but have to be very careful with potassium monitoring with these changes so I will defer to him  Please schedule a follow up visit in 6 months but call sooner if needed - referred to rehab    02/03/2018  f/u ov/[name] re:  GOLD III/ 02 hs only  - pre op clearance  Chief Complaint  Patient presents with  . Follow-up    sx clearance for bladder tack on 8/29.  Pt states her breathing is at baseline.    Dyspnea:  MMRC2 = can't walk a nl pace on a flat grade s sob but does fine slow and flat eg foodlion s 02  Cough: none Sleeping: 2lpm 1-2 pillows ok /min am congestion  SABA use: neither or hfa or neb  02: 2lpm hs only   rec Work on inhaler technique:  relax and gently blow all the way out then take a nice smooth deep breath back in, triggering the inhaler at same time you start breathing in.  Hold for up to 5 seconds if you can. Blow symbicort out thru nose. Rinse and gargle with water  when done Use your nebulizer the morning of your surgery    05/03/18 acute ov/ NP aecopd rec Seen today for COPD exacerbation secondary to URI Office treatment: Xopenex neb treatment x1 today Rx: Prednisone taper as prescribed Z-Pak as prescribed Recommendations: Used Albuterol nebulizer every 6 hours as needed for shortness of breath/wheezing for the next 3 to 5 days until better You can return to work as long as you are afebrile for 24 hours Stay well-hydrated, encourage 4-6 glass of fluids (8 oz)    05/06/2018 acute extended ov/Shady Padron re: sob/cough / did not bring meds not using flutter  Chief Complaint  Patient presents with  . Follow-up    was here   11/18 for SOB and cough.  Not doing any better.    Usual state of healthy until 11/15 severe cough never productive assoc with scratchy throat / watery rhinits   sleeping on 2lpm flat/ no worse in am but worse as day goes on and does not feel neb helps as much as hfa but extremely   technique poor hfa technique  Sob at rest / reports prefers hfa over neb when can't catch her breath rec For cough >  mucinex dm  1200 mg every 12hours with the flutter valve and supplement with oxycodone or hydrocodone every 4 hours and as long as you are coughing > Try prilosec otc 56m  Take 30-60 min before first meal of the day and Pepcid ac (famotidine) 20 mg one @  bedtime until cough is completely gone for at least a week without the need for cough suppression Finish zpak  Prednisone 10 mg Take 4 for two days three for two days two for two days one for two days  Work on inhaler technique: GERD (REFLUX) If not improving you will need to go to ER > admit  Admit date: 05/08/2018 Discharge date: 05/20/2018   Discharge Diagnoses:      Active Hospital Problems   Diagnosis Date Noted  . COPD with acute exacerbation ([facility] 05/09/2018  . Hyponatremia 05/09/2018  . Acute urinary retention 05/09/2018  . Chronic respiratory failure with hypoxia ([facility] 06/30/2014  . Essential hypertension 03/28/2012    History of present illness:  [name] a 73y.o.femalewith medical history significant forCOPD with chronic hypoxic respiratory failure, hypertension, and chronic bilateral leg swelling and ulcerations, now presenting to the emergency department for evaluation of shortness of breath and productive cough. Patient was seen in her pulmonology clinic on 05/03/2018 with productive cough and increased dyspnea, was diagnosed with acute exacerbation in COPD and started on prednisone and azithromycin. She failed to improve, was seen back on 05/06/2018 and reports that her prednisone course was extended. Unfortunately, she still has not had any significant improvement and may actually be worsening. She has been dyspneic at rest for the past couple days and has had difficulty sleeping due to this. She denies fevers, chills, or chest pain. Reports that her chronic bilateral lower extremity  swelling is not as bad as usual. Pt admitted for further management   Today, pt reported feeling better, was able to ambulate with PT, with noted desaturations requiring home O2. Pt advised to use home O2 constantly (at rest and ambulation), not just at bedtime. Pt discharged on a long taper of steroids and advised to follow up closely with PCP and pulmonary. [facility]  Principal Problem:   COPD with acute exacerbation ([facility] Active Problems:   Essential  hypertension   Chronic respiratory failure with hypoxia (HCC)   Hyponatremia   Acute urinary retention  Acute on chronic hypoxic respiratory failure with COPD Exacerbation -Presented with worsening shortness of breath, cough despite being treated with prednisone and azithromycin since 05/03/2018 - long hospital stay with slow improvement, admitted on 11/23 and required BIPAP at admission, and has completed course of abx with levaquin on 11/27. She's been transitioned from IV solumedrol to PO prednisone -Continue duoneb, inhaler, tapered dose of prednisone -Completed course of Levaquin  -Continues to require supplemental O2 (3 L now, was previously on 2 L at night only) -CXR 11/30, showing improvement -Follow up with PCP and Dr Melvyn Novas (pulmonary)  Hyponatremia -resolved  Essential hypertension -Continue diltiazem, losartan  Alcohol use -Continue folic acid, thiamine, multivitamin   Leukocytosis  -likely due to steroids  Hyperglycemia -no history of diabetes  -hemoglobin A1c 6.1  Deconditioning -secondary to respiratory status/illness -PT evaluated patient, no further needs -OT recommended [facility]     06/01/2018  Post hosp f/u ov/Monterrio Gerst re: transition of care:   Aecopd/ 02 dep 24/7  Chief Complaint  Patient presents with  . Hospitalization Follow-up    Breathing has improved some but not back to her normal baseline. She is using her o2 3lpm 24/7.  She has not used her albuterol  inhaler but has been using her albuterol neb once daily on average.   Dyspnea:  MMRC4  = sob if tries to leave home or while getting dressed   Cough: some rattling  Sleeping: horizontal on back ok SABA use: avg neb once  02: 3lpm 24/7 rec 3lpm at bedtime, none at rest and turn up your 02 to keep it above 90% with activity  Plan A = Automatic = symbicort and spiriva  Plan B = Backup Only use your albuterol inhaler as a rescue medication   Plan C = Crisis - only use your albuterol nebulizer if you first try Plan B and it fails to help > ok to use the nebulizer up to every 4 hours but if start needing it regularly call for immediate appointment For cough/ congestion >  mucinex or mucinex dm up to 1200 mg every 12 hours with flutter valve  Work on inhaler technique:  If not improving >  Prednisone 10 mg take  4 each am x 2 days,   2 each am x 2 days,  1 each am x 2 days and stop  d to see Tammy in meantime if not improving  - if worse swelling consider aldactone  - late add needs echo for ? Cor pulmonale       07/14/2018  f/u ov/Kielee Care re:  Copd GOLD III spirometry   / cor pulmonale on noct 02 and prn daytime "doesn't help"  Has prednisone on file for prn use but hasn't used since last ov  Chief Complaint  Patient presents with  . Follow-up    Breathing is about the same. She rarely uses proair.    Dyspnea: MMRC3 = can't walk 100 yards even at a slow pace at a flat grade s stopping due to sob   Cough: no Sleeping: flat on back SABA use: farely 02: 2lpm hs/ none at rest, not consistent with it walking    No obvious day to day or daytime variability or assoc excess/ purulent sputum or mucus plugs or hemoptysis or cp or chest tightness, subjective wheeze or overt sinus or hb symptoms.   Sleeping  without nocturnal  or  early am exacerbation  of respiratory  c/o's or need for noct saba. Also denies any obvious fluctuation of symptoms with weather or environmental changes or other  aggravating or alleviating factors except as outlined above   No unusual exposure hx or h/o childhood pna/ asthma or knowledge of premature birth.  Current Allergies, Complete Past Medical History, Past Surgical History, Family History, and Social History were reviewed in [employer] record.  ROS  The following are not active complaints unless bolded Hoarseness, sore throat, dysphagia, dental problems, itching, sneezing,  nasal congestion or discharge of excess mucus or purulent secretions, ear ache,   fever, chills, sweats, unintended wt loss or wt gain, classically pleuritic or exertional cp,  orthopnea pnd or arm/hand swelling  or leg swelling, presyncope, palpitations, abdominal pain, anorexia, nausea, vomiting, diarrhea  or change in bowel habits or change in bladder habits, change in stools or change in urine, dysuria, hematuria,  rash, arthralgias, visual complaints, headache, numbness, weakness or ataxia or problems with walking or coordination,  change in mood or  memory.        Current Meds  Medication Sig  . albuterol (PROAIR HFA) 108 (90 Base) MCG/ACT inhaler 2 puffs every 4 hours as needed only  if your can't catch your breath (Patient taking differently: Inhale 2 puffs into the lungs every 4 (four) hours as needed (if unable to catch breath). 2 puffs every 4 hours as needed only  if your can't catch your breath)  . albuterol (PROVENTIL) (2.5 MG/3ML) 0.083% nebulizer solution Take 3 mLs (2.5 mg total) by nebulization every 6 (six) hours as needed for wheezing or shortness of breath.  . Ascorbic Acid (VITAMIN C WITH ROSE HIPS) 500 MG tablet Take 500 mg by mouth daily.  . budesonide-formoterol (SYMBICORT) 160-4.5 MCG/ACT inhaler Inhale 2 puffs into the lungs 2 (two) times daily.  Marland Kitchen buPROPion (WELLBUTRIN SR) 150 MG 12 hr tablet Take 150 mg by mouth daily.   . cephALEXin (KEFLEX) 500 MG capsule Take 500 mg by mouth 3 (three) times daily.  . cholecalciferol (VITAMIN  D) 1000 UNITS tablet Take 1,000 Units by mouth daily.  Marland Kitchen diltiazem (CARDIZEM CD) 360 MG 24 hr capsule Take 360 mg by mouth daily.   . furosemide (LASIX) 20 MG tablet Take 20 mg by mouth daily.   . Garlic 585 MG TABS Take 1 tablet by mouth daily.  Marland Kitchen HYDROcodone-acetaminophen (NORCO/VICODIN) 5-325 MG tablet Take 1 tablet by mouth every 4 (four) hours as needed. (Patient taking differently: Take 1 tablet by mouth every 4 (four) hours as needed for moderate pain or severe pain. )  . HYDROcodone-homatropine (HYCODAN) 5-1.5 MG/5ML syrup Take 5 mLs by mouth every 6 (six) hours as needed for cough.  . losartan (COZAAR) 100 MG tablet Take 100 mg by mouth daily.  . Multiple Minerals (CALCIUM-MAGNESIUM-ZINC) TABS Take 1 tablet by mouth daily.  . Multiple Minerals-Vitamins (CAL MAG ZINC +D3 PO) Take 1 tablet by mouth at bedtime.  . ondansetron (ZOFRAN) 4 MG tablet Take 1 tablet (4 mg total) by mouth every 8 (eight) hours as needed for nausea or vomiting.  . OXYGEN Inhale 3 L into the lungs. 24/7 DME- [facility]  . potassium chloride (K-DUR) 10 MEQ tablet Take 10 mEq by mouth daily as needed (swelling). When takes lasix   . Respiratory Therapy Supplies (FLUTTER) DEVI Use as directed  . spironolactone (ALDACTONE) 25 MG tablet Take 1 tablet by mouth 2 (two) times daily.  . Tiotropium Bromide Monohydrate (  SPIRIVA RESPIMAT) 2.5 MCG/ACT AERS Inhale 2 puffs into the lungs daily.                          Objective:   Physical Exam    amb chronically ill wf nad     09/26/2013  101>102 10/11/2013 > 01/16/2014  99 > 04/06/2014  103 >105 06/30/2014 > 08/11/2014  101 > 11/09/2014  104 > 01/22/2015 106  > 02/06/2015 105 > 03/08/2015 106 > 04/18/2015  106 > 06/15/2015 103 > 09/14/2015   102 > 10/08/2015   103 > 11/05/2015  102 > 02/05/2016  103 > 05/14/2016 102 > 09/24/2016  99 > 12/24/2016  99 > 06/29/2017  104 > 09/28/2017  106 > 11/11/2017 104 > 02/03/2018  107 > 05/06/2018   105 > 06/01/2018  111> 07/14/2018  110    Vital signs  reviewed - Note on arrival 02 sats  91% on RA       HEENT: nl dentition / oropharynx. Nl external ear canals without cough reflex -  Mild bilateral non-specific turbinate edema     NECK :  without JVD/Nodes/TM/ nl carotid upstrokes bilaterally   LUNGS: no acc muscle use,  Mod barrel  contour chest wall with bilateral  Distant bs s audible wheeze and  without cough on insp or exp maneuver and mod  Hyperresonant  to  percussion bilaterally     CV:  RRR  no s3 or murmur or increase in P2, and no edema   ABD:  soft and nontender with pos mid insp Hoover's  in the supine position. No bruits or organomegaly appreciated, bowel sounds nl  MS:   Nl gait/  ext warm without deformities, calf tenderness, cyanosis or clubbing No obvious joint restrictions   SKIN: warm and dry without lesions    NEURO:  alert, approp, nl sensorium with  no motor or cerebellar deficits apparent.          Assessment & Plan:

## 2018-07-14 NOTE — Assessment & Plan Note (Signed)
Quit smoking 03/2017 pfts 11/2011   FEV1 0.86 (42%) ratio 35  - alpha one AT screening 03/24/12   MM  Level 166  - 09/14/2015  hfa Arlana Pouch 11/05/2015 (when spiriva dpi supply runs out) - flutter valve added 02/05/2016  And recoached 05/14/2016  - PFT's  06/29/2017  FEV1 0.92 (44 % ) ratio 44  p 11 % improvement from saba p symb/spiriva prior to study with DLCO  33/33 % corrects to 35 % for alv volume   - 11/11/2017 referred to rehab   05/03/2018 - COPD exac d/t URI, RX zpack and prednisone taper > failed outpt rx and admitted    -  07/14/2018  After extensive coaching inhaler device,  effectiveness =    75% short Ti   Group D in terms of symptom/risk and laba/lama/ICS  therefore appropriate rx at this point > no change rx needed / needs to learn to pace herself better   The goal with a chronic steroid dependent illness is always arriving at the lowest effective dose that controls the disease/symptoms and not accepting a set "formula" which is based on statistics or guidelines that don't always take into account patient  variability or the natural hx of the dz in every individual patient, which may well vary over time.  For now therefore I recommend the patient maintain  Off pred but in event of flare > 10 mg x 2 daily until better then 1 daily x 3 d and stop

## 2018-07-15 ENCOUNTER — Telehealth: Payer: Self-pay | Admitting: Internal Medicine

## 2018-07-15 ENCOUNTER — Encounter: Payer: Self-pay | Admitting: Internal Medicine

## 2018-07-15 NOTE — Telephone Encounter (Signed)
Called and spoke the patient she verbalized  Understand I have printed they letter and faxed to number provided. Nothing further is needed.

## 2018-07-15 NOTE — Telephone Encounter (Addendum)
Spoke with pt. She is requesting to have work note from Dr. Melvyn Novas. Pt is currently on short term disability and is needing the note to extend this. She was also wanting to know if Dr. Melvyn Novas thinks it would be a good idea to take Daryll Drown, it's a dietary supplement that is supposed to help with your breathing. It comes in drops and tablets. An ONO was ordered at her OV and she is wanting to know when she will set up for this. Pt has been advised that [facility] will contact her about getting her ONO set up.  MW - please advise. Thanks!  Once letter is completed it will need to be faxed to [phone] Doctor, general practice)

## 2018-07-15 NOTE — Telephone Encounter (Signed)
No evidence to support Daryll Drown or any other specific supplement so she can take it at her own risk   Ok for st disability to state only capable of deskwork indefinitely or 3 months minimum and we can address again at next ov

## 2018-07-22 ENCOUNTER — Telehealth: Payer: Self-pay | Admitting: Internal Medicine

## 2018-07-22 DIAGNOSIS — J9611 Chronic respiratory failure with hypoxia: Secondary | ICD-10-CM

## 2018-07-22 DIAGNOSIS — J9612 Chronic respiratory failure with hypercapnia: Principal | ICD-10-CM

## 2018-07-22 NOTE — Telephone Encounter (Signed)
Received an ONO from [facility] for patient. It was performed on 2L of O2. Results have been placed into MW's folder.

## 2018-07-22 NOTE — Telephone Encounter (Signed)
desats on 2lpm so try 3lpm and repeat ono on 3lpm

## 2018-07-22 NOTE — Telephone Encounter (Signed)
Pt is aware of results and voiced her understanding. Order has been placed for repeat ONO on 3L. Nothing further is needed.

## 2018-07-23 ENCOUNTER — Telehealth: Payer: Self-pay | Admitting: Internal Medicine

## 2018-07-23 DIAGNOSIS — J9611 Chronic respiratory failure with hypoxia: Secondary | ICD-10-CM

## 2018-07-23 NOTE — Telephone Encounter (Signed)
ONO on 2lpm done on 07/20/2018 by [facility]  Per MW- needs to increase o2 to 3lpm and repeat ONO on 3lpm  Spoke with pt and notified of results per Dr. Melvyn Novas. Pt verbalized understanding and denied any questions.  Order send to [facility]

## 2018-07-26 ENCOUNTER — Other Ambulatory Visit: Payer: Self-pay | Admitting: Internal Medicine

## 2018-07-26 DIAGNOSIS — J9612 Chronic respiratory failure with hypercapnia: Principal | ICD-10-CM

## 2018-07-26 DIAGNOSIS — J9611 Chronic respiratory failure with hypoxia: Secondary | ICD-10-CM

## 2018-07-30 ENCOUNTER — Encounter: Payer: Self-pay | Admitting: Internal Medicine

## 2018-08-02 ENCOUNTER — Telehealth: Payer: Self-pay | Admitting: Internal Medicine

## 2018-08-02 NOTE — Telephone Encounter (Signed)
Per MW- ONO on 3lpm (done on 07/30/2018 by [facility]) was normal, needs to use 3lpm o2 with sleep  Spoke with pt and notified of results per Dr. Melvyn Novas. Pt verbalized understanding and denied any questions.

## 2018-08-03 ENCOUNTER — Encounter: Payer: Medicare Other | Admitting: Vascular Surgery

## 2018-08-03 ENCOUNTER — Encounter: Payer: Self-pay | Admitting: Family

## 2018-08-03 ENCOUNTER — Encounter (HOSPITAL_COMMUNITY): Payer: Medicare Other

## 2018-08-11 ENCOUNTER — Ambulatory Visit (INDEPENDENT_AMBULATORY_CARE_PROVIDER_SITE_OTHER): Payer: Medicare Other | Admitting: Internal Medicine

## 2018-08-11 ENCOUNTER — Encounter: Payer: Self-pay | Admitting: Internal Medicine

## 2018-08-11 DIAGNOSIS — I2781 Cor pulmonale (chronic): Secondary | ICD-10-CM | POA: Diagnosis not present

## 2018-08-11 DIAGNOSIS — J9611 Chronic respiratory failure with hypoxia: Secondary | ICD-10-CM | POA: Diagnosis not present

## 2018-08-11 DIAGNOSIS — J449 Chronic obstructive pulmonary disease, unspecified: Secondary | ICD-10-CM

## 2018-08-11 DIAGNOSIS — J9612 Chronic respiratory failure with hypercapnia: Secondary | ICD-10-CM

## 2018-08-11 NOTE — Telephone Encounter (Signed)
error

## 2018-08-11 NOTE — Patient Instructions (Signed)
For increased / wheezing/ cough or need for albuterol > prednisone 10 mg x 2 until better then 1 daily x 3 days and stop   Monitor your 02 level with exertion with goal of keeping over 90%    Keep your previous appt.

## 2018-08-11 NOTE — Progress Notes (Signed)
Subjective:   Patient ID: [name], female    DOB: 12/26/1945   MRN: [id number]  Brief patient profile:  62 yowf MM quit smoking 03/2017  followed in pulmonary clinic for GOLD III copd   History of Present Illness  11/05/2015  f/u ov/[name] re: GOLD III / still smoking / maint rx symb/spriiva dpi  Chief Complaint  Patient presents with  . Follow-up    pt states breathing is baseline. c/o cont SOB, prod cough tan to greenish in color, wheezing, cp/tightness  dyspnea is variable and has portable tanks but never uses them to see if makes any difference in her variable doe / using saba up to 4 x daily instead.   rec You are qualified for portable 02 if you wish but no need to wear it at rest or walking around the house - continue it at bedtime thru the concentrator at 2lpm and wear the portable system as needed during the day Ok to finish up the powder spiriva and just refill the respimat    12/24/2016  f/u ov/[name] re:  COPD GOLD III/ still smoking/ symb/spiriva / 02 2lpm hs only  Chief Complaint  Patient presents with  . Follow-up    Pt states she is still having increase sob with exerton, she still has a slight cough with clear mucus with a tint of blood, lots of wheezing,Denies chest tightness,fever   using saba hfa up to twice daily but no needing noct Doe = MMRC3 = can't walk 100 yards even at a slow pace at a flat grade s stopping due to sob   Has slt bloody nasal discharge in am and scant blood streaked sputum also, just in am and just when nose is bleeding too    11/11/2017  f/u ov/[name] re:  GOLD III/ 02 hs prn  Chief Complaint  Patient presents with  . Follow-up    Breathing is about the same. She is using her albuterol inhaler 2-3 x per wk on average and she rarely uses neb.   Dyspnea:   No change in doe = MMRC3  Cough: not much at all  Sleep: flat on 2lpm  SABA use:  As above Sleeping  Ok on 2lpm flat   rec Discuss the fluid pills with  Dr Redmond Pulling but I will  recommend he consider adding aldactone if not improving on lasix alone but have to be very careful with potassium monitoring with these changes so I will defer to him  Please schedule a follow up visit in 6 months but [name] sooner if needed - referred to rehab    02/03/2018  f/u ov/[name] re:  GOLD III/ 02 hs only  - pre op clearance  Chief Complaint  Patient presents with  . Follow-up    sx clearance for bladder tack on 8/29.  Pt states her breathing is at baseline.    Dyspnea:  MMRC2 = can't walk a nl pace on a flat grade s sob but does fine slow and flat eg [employer] s 02  Cough: none Sleeping: 2lpm 1-2 pillows ok /min am congestion  SABA use: neither or hfa or neb  02: 2lpm hs only   rec Work on inhaler technique:  relax and gently blow all the way out then take a nice smooth deep breath back in, triggering the inhaler at same time you start breathing in.  Hold for up to 5 seconds if you can. Blow symbicort out thru nose. Rinse and gargle with water  when done Use your nebulizer the morning of your surgery    05/03/18 acute ov/ NP aecopd rec Seen today for COPD exacerbation secondary to URI Office treatment: Xopenex neb treatment x1 today Rx: Prednisone taper as prescribed Z-Pak as prescribed Recommendations: Used Albuterol nebulizer every 6 hours as needed for shortness of breath/wheezing for the next 3 to 5 days until better You can return to work as long as you are afebrile for 24 hours Stay well-hydrated, encourage 4-6 glass of fluids (8 oz)    05/06/2018 acute extended ov/[name] re: sob/cough / did not bring meds not using flutter  Chief Complaint  Patient presents with  . Follow-up    was here   11/18 for SOB and cough.  Not doing any better.    Usual state of healthy until 11/15 severe cough never productive assoc with scratchy throat / watery rhinits   sleeping on 2lpm flat/ no worse in am but worse as day goes on and does not feel neb helps as much as hfa but extremely   technique poor hfa technique  Sob at rest / reports prefers hfa over neb when can't catch her breath rec For cough >  mucinex dm  1200 mg every 12hours with the flutter valve and supplement with oxycodone or hydrocodone every 4 hours and as long as you are coughing > Try prilosec otc 56m  Take 30-60 min before first meal of the day and Pepcid ac (famotidine) 20 mg one @  bedtime until cough is completely gone for at least a week without the need for cough suppression Finish zpak  Prednisone 10 mg Take 4 for two days three for two days two for two days one for two days  Work on inhaler technique: GERD (REFLUX) If not improving you will need to go to ER > admit  Admit date: 05/08/2018 Discharge date: 05/20/2018   Discharge Diagnoses:      Active Hospital Problems   Diagnosis Date Noted  . COPD with acute exacerbation ([facility] 05/09/2018  . Hyponatremia 05/09/2018  . Acute urinary retention 05/09/2018  . Chronic respiratory failure with hypoxia ([facility] 06/30/2014  . Essential hypertension 03/28/2012    History of present illness:  Crystal SanluisPippinis a 73y.o.femalewith medical history significant forCOPD with chronic hypoxic respiratory failure, hypertension, and chronic bilateral leg swelling and ulcerations, now presenting to the emergency department for evaluation of shortness of breath and productive cough. Patient was seen in her pulmonology clinic on 05/03/2018 with productive cough and increased dyspnea, was diagnosed with acute exacerbation in COPD and started on prednisone and azithromycin. She failed to improve, was seen back on 05/06/2018 and reports that her prednisone course was extended. Unfortunately, she still has not had any significant improvement and may actually be worsening. She has been dyspneic at rest for the past couple days and has had difficulty sleeping due to this. She denies fevers, chills, or chest pain. Reports that her chronic bilateral lower extremity  swelling is not as bad as usual. Pt admitted for further management   Today, pt reported feeling better, was able to ambulate with PT, with noted desaturations requiring home O2. Pt advised to use home O2 constantly (at rest and ambulation), not just at bedtime. Pt discharged on a long taper of steroids and advised to follow up closely with PCP and pulmonary. [facility]  Principal Problem:   COPD with acute exacerbation ([facility] Active Problems:   Essential  hypertension   Chronic respiratory failure with hypoxia (HCC)   Hyponatremia   Acute urinary retention  Acute on chronic hypoxic respiratory failure with COPD Exacerbation -Presented with worsening shortness of breath, cough despite being treated with prednisone and azithromycin since 05/03/2018 - long hospital stay with slow improvement, admitted on 11/23 and required BIPAP at admission, and has completed course of abx with levaquin on 11/27. She's been transitioned from IV solumedrol to PO prednisone -Continue duoneb, inhaler, tapered dose of prednisone -Completed course of Levaquin  -Continues to require supplemental O2 (3 L now, was previously on 2 L at night only) -CXR 11/30, showing improvement -Follow up with PCP and Dr Melvyn Novas (pulmonary)  Hyponatremia -resolved  Essential hypertension -Continue diltiazem, losartan  Alcohol use -Continue folic acid, thiamine, multivitamin   Leukocytosis  -likely due to steroids  Hyperglycemia -no history of diabetes  -hemoglobin A1c 6.1  Deconditioning -secondary to respiratory status/illness -PT evaluated patient, no further needs -[facility]     06/01/2018  Post hosp f/u ov/[name] re: transition of care:   Aecopd/ 02 dep 24/7  Chief Complaint  Patient presents with  . Hospitalization Follow-up    Breathing has improved some but not back to her normal baseline. She is using her o2 3lpm 24/7.  She has not used her albuterol  inhaler but has been using her albuterol neb once daily on average.   Dyspnea:  MMRC4  = sob if tries to leave home or while getting dressed   Cough: some rattling  Sleeping: horizontal on back ok SABA use: avg neb once  02: 3lpm 24/7 rec 3lpm at bedtime, none at rest and turn up your 02 to keep it above 90% with activity  Plan A = Automatic = symbicort and spiriva  Plan B = Backup Only use your albuterol inhaler as a rescue medication   Plan C = Crisis - only use your albuterol nebulizer if you first try Plan B and it fails to help > ok to use the nebulizer up to every 4 hours but if start needing it regularly [name] for immediate appointment For cough/ congestion >  mucinex or mucinex dm up to 1200 mg every 12 hours with flutter valve  Work on inhaler technique:  If not improving >  Prednisone 10 mg take  4 each am x 2 days,   2 each am x 2 days,  1 each am x 2 days and stop      07/14/2018  f/u ov/[name] re:  Copd GOLD III spirometry   / cor pulmonale on noct 02 and prn daytime "doesn't help"  Has prednisone on file for prn use but hasn't used since last ov  Chief Complaint  Patient presents with  . Follow-up    Breathing is about the same. She rarely uses proair.    Dyspnea: MMRC3 = can't walk 100 yards even at a slow pace at a flat grade s stopping due to sob   Cough: no Sleeping: flat on back SABA ZOX:WRUEAV 02: 2lpm hs/ none at rest, not consistent with it walking  rec Please see patient coordinator before you leave today  to schedule overnight 02 on 2lpm  When walking be sure for a given activity like shopping at a comfortable pace that your 02 level stays above 90%     08/11/2018  Acute ov/[name] re:  Copd GOLD III/ cor pulmonale:  symb 160 /spiriva / 02 hs and prn day Has pred as plan D but confused  when and how to use it  Chief Complaint  Patient presents with  . Acute Visit    runny nose- min green nasal d/c and increased cough- started last night. She is feeling more SOB  today.   Dyspnea:  No increase Cough: more congested in am and turning slt more yellow on day of ov  Sleeping: bed is flat/ one pillow SABA use: not using at all despite  02: 3lpm hs and as needed during the day but not titrating as rec    No obvious day to day or daytime variability or assoc excess/ purulent sputum or mucus plugs or hemoptysis or cp or chest tightness, subjective wheeze or overt   hb symptoms.   Sleeping as above without nocturnal  or early am exacerbation  of respiratory  c/o's or need for noct saba. Also denies any obvious fluctuation of symptoms with weather or environmental changes or other aggravating or alleviating factors except as outlined above   No unusual exposure hx or h/o childhood pna/ asthma or knowledge of premature birth.  Current Allergies, Complete Past Medical History, Past Surgical History, Family History, and Social History were reviewed in [employer] record.  ROS  The following are not active complaints unless bolded Hoarseness, sore throat, dysphagia, dental problems, itching, sneezing,  nasal congestion or discharge of excess mucus or purulent secretions, ear ache,   fever, chills, sweats, unintended wt loss or wt gain, classically pleuritic or exertional cp,  orthopnea pnd or arm/hand swelling  or leg swelling, presyncope, palpitations, abdominal pain, anorexia, nausea, vomiting, diarrhea  or change in bowel habits or change in bladder habits, change in stools or change in urine, dysuria, hematuria,  rash, arthralgias, visual complaints, headache, numbness, weakness or ataxia or problems with walking or coordination,  change in mood or  memory.        Current Meds  Medication Sig  . albuterol (PROAIR HFA) 108 (90 Base) MCG/ACT inhaler 2 puffs every 4 hours as needed only  if your can't catch your breath (Patient taking differently: Inhale 2 puffs into the lungs every 4 (four) hours as needed (if unable to catch breath). 2 puffs  every 4 hours as needed only  if your can't catch your breath)  . albuterol (PROVENTIL) (2.5 MG/3ML) 0.083% nebulizer solution Take 3 mLs (2.5 mg total) by nebulization every 6 (six) hours as needed for wheezing or shortness of breath.  . Ascorbic Acid (VITAMIN C WITH ROSE HIPS) 500 MG tablet Take 500 mg by mouth daily.  . budesonide-formoterol (SYMBICORT) 160-4.5 MCG/ACT inhaler Inhale 2 puffs into the lungs 2 (two) times daily.  Marland Kitchen buPROPion (WELLBUTRIN SR) 150 MG 12 hr tablet Take 150 mg by mouth daily.   . cholecalciferol (VITAMIN D) 1000 UNITS tablet Take 1,000 Units by mouth daily.  Marland Kitchen diltiazem (CARDIZEM CD) 360 MG 24 hr capsule Take 360 mg by mouth daily.   . furosemide (LASIX) 20 MG tablet Take 20 mg by mouth daily.   . Garlic 732 MG TABS Take 1 tablet by mouth daily.  Marland Kitchen HYDROcodone-acetaminophen (NORCO/VICODIN) 5-325 MG tablet Take 1 tablet by mouth every 4 (four) hours as needed. (Patient taking differently: Take 1 tablet by mouth every 4 (four) hours as needed for moderate pain or severe pain. )  . HYDROcodone-homatropine (HYCODAN) 5-1.5 MG/5ML syrup Take 5 mLs by mouth every 6 (six) hours as needed for cough.  . losartan (COZAAR) 100 MG tablet Take 100 mg by mouth daily.  . Multiple  Minerals (CALCIUM-MAGNESIUM-ZINC) TABS Take 1 tablet by mouth daily.  . Multiple Minerals-Vitamins (CAL MAG ZINC +D3 PO) Take 1 tablet by mouth at bedtime.  . ondansetron (ZOFRAN) 4 MG tablet Take 1 tablet (4 mg total) by mouth every 8 (eight) hours as needed for nausea or vomiting.  . OXYGEN Inhale 3 L into the lungs. 24/7 DME- [employer]  . potassium chloride (K-DUR) 10 MEQ tablet Take 10 mEq by mouth daily as needed (swelling). When takes lasix   . predniSONE (DELTASONE) 10 MG tablet When breathing gets worse:  Prednisone 10 mg x 2 until better then 1 daily x 3 days and stop  . Respiratory Therapy Supplies (FLUTTER) DEVI Use as directed  . spironolactone (ALDACTONE) 25 MG tablet Take 1 tablet by mouth 2  (two) times daily.  . Tiotropium Bromide Monohydrate (SPIRIVA RESPIMAT) 2.5 MCG/ACT AERS Inhale 2 puffs into the lungs daily.             Objective:  Physical Exam    amb thin chronically ill wf nad     09/26/2013  101>102 10/11/2013 > 01/16/2014  99 > 04/06/2014  103 >105 06/30/2014 > 08/11/2014  101 > 11/09/2014  104 > 01/22/2015 106  > 02/06/2015 105 > 03/08/2015 106 > 04/18/2015  106 > 06/15/2015 103 > 09/14/2015   102 > 10/08/2015   103 > 11/05/2015  102 > 02/05/2016  103 > 05/14/2016 102 > 09/24/2016  99 > 12/24/2016  99 > 06/29/2017  104 > 09/28/2017  106 > 11/11/2017 104 > 02/03/2018  107 > 05/06/2018   105 > 06/01/2018  111> 07/14/2018  110 > 08/11/2018   109    Vital signs reviewed - Note on arrival 02 sats  94% on RA        HEENT: nl dentition / oropharynx. Nl external ear canals without cough reflex -  Mild bilateral non-specific turbinate edema     NECK :  without JVD/Nodes/TM/ nl carotid upstrokes bilaterally   LUNGS: no acc muscle use,  Mod barrel  contour chest wall with bilateral  Distant bs with trace mid exp wheezing  and  without cough on insp or exp maneuver and mod  Hyperresonant  to  percussion bilaterally     CV:  RRR  no s3 or murmur or increase in P2, and trace sym bilateral ankle edema   ABD:  soft and nontender with pos mid insp Hoover's  in the supine position. No bruits or organomegaly appreciated, bowel sounds nl  MS:   Nl gait/  ext warm without deformities, calf tenderness, cyanosis or clubbing No obvious joint restrictions   SKIN: warm and dry without lesions    NEURO:  alert, approp, nl sensorium with  no motor or cerebellar deficits apparent.                 Assessment & Plan:

## 2018-08-12 ENCOUNTER — Encounter: Payer: Self-pay | Admitting: Internal Medicine

## 2018-08-12 NOTE — Assessment & Plan Note (Addendum)
Exertional desats 06/30/2014 >O2 2lm rx with act  - 11/09/2014  Walked RA  2 laps @ 185 ft each stopped due to  Sat down to 88 % at nl pace but no sob  - ono RA  09/22/15  desat < 89% x 1:78mn > rec continue 02 2lpm - 49/16/9450certification: Patient Saturations on Room Air at Rest = 94% and while Ambulating 2 laps x 185 each= 87% Patient Saturations on 2Liters of oxygen while Ambulating =96% x one additional lap - 11/05/2015  Walked RA x 4 laps @ 185 ft each stopped due to  desat to 87% resolved on 2lpm   - 06/29/2017  Walked RA x 3 laps @ 185 ft each stopped due to  End of study/ slow pace, no desat - 11/11/2017  Walked RA x 3 laps @ 185 ft each stopped due to  End of study,  no  desat  < 90% at moderate to moderately  fast pace   - HCO3  05/20/18  = 34    - 07/14/2018 Room Air at Rest = 96% Patient Saturations on Room Air while Ambulating = 88% Patient Saturations on 3 Liters of oxygen while Ambulating = 95% - ono on 2lpm completed 07/20/18  desat x 132 min so 07/22/2018 rec  repeat ono on 3lpm> done 07/30/18 on 3lpm = 10.6 m at < 89% so ok to continue 3lpm hs    As of 08/11/2018 3lpm hs  And 3lpm walking more than around the house with goal of > 90% at all times due to underlying cor pulonale    Each maintenance medication was reviewed in detail including most importantly the difference between maintenance and as needed and under what circumstances the prns are to be used.  Please see AVS for specific  Instructions which are unique to this visit and I personally typed out  which were reviewed in detail in writing with the patient and a copy provided.

## 2018-08-12 NOTE — Assessment & Plan Note (Addendum)
Quit smoking 03/2017 pfts 11/2011   FEV1 0.86 (42%) ratio 35  - alpha one AT screening 03/24/12   MM  Level 166  - 09/14/2015  hfa Arlana Pouch 11/05/2015 (when spiriva dpi supply runs out) - flutter valve added 02/05/2016  And recoached 05/14/2016  - PFT's  06/29/2017  FEV1 0.92 (44 % ) ratio 44  p 11 % improvement from saba p symb/spiriva prior to study with DLCO  33/33 % corrects to 35 % for alv volume   - 11/11/2017 referred to rehab  > did not go  05/03/2018 - COPD exac d/t URI, RX zpack and prednisone taper > failed outpt rx and admitted  - 08/11/2018  After extensive coaching inhaler device,  effectiveness =    75% (short Ti)  - 08/11/2018 added pred as plan D    Acute flare with wheezing and confused with instructions on how to use pred as a back up >> see avs for instructions unique to this ov

## 2018-08-12 NOTE — Assessment & Plan Note (Addendum)
Clinical dx only/ Adequate control on present rx, reviewed in detail with pt > no change in rx needed

## 2018-09-14 ENCOUNTER — Encounter: Payer: Medicare Other | Admitting: Vascular Surgery

## 2018-09-14 ENCOUNTER — Encounter (HOSPITAL_COMMUNITY): Payer: Medicare Other

## 2018-11-09 ENCOUNTER — Other Ambulatory Visit: Payer: Self-pay | Admitting: Internal Medicine

## 2018-11-19 ENCOUNTER — Telehealth: Payer: Self-pay | Admitting: Internal Medicine

## 2018-11-19 NOTE — Telephone Encounter (Signed)
Pt needs a note stating that she is not able to go in to work until covid restrictions are lifted due to pulmonary issues.  Needs faxed to [phone] attn:[employer], Barnie Alderman.  Please let pt know when it is faxed.  Will leave in triage to finish on Monday.

## 2018-11-22 ENCOUNTER — Telehealth: Payer: Self-pay | Admitting: *Deleted

## 2018-11-22 NOTE — Telephone Encounter (Signed)
Pt called to leave the correct fax number for her work [phone] for her paper to be sent to work [employer].

## 2018-11-22 NOTE — Telephone Encounter (Signed)
Dr. Melvyn Novas is out of the office all week. Other that MW, pt last saw Beth. Beth, please advise if you are okay with a letter being written and if so, what all should we state in the letter. Thanks!

## 2018-11-22 NOTE — Telephone Encounter (Signed)
Yes, standard letter is fine. She is at high risk for COVID complications d/t co-morbidities.

## 2018-11-22 NOTE — Telephone Encounter (Signed)
I have written the correct fax number down. Once letter has been signed by Eustaquio Maize, will fax it to [employer] for pt. Nothing further needed.

## 2018-11-22 NOTE — Telephone Encounter (Signed)
Fax number was provided by pt. Nothing further needed.

## 2018-11-22 NOTE — Telephone Encounter (Signed)
Virtual Visit Pre-Appointment Phone Call  Today, I spoke with [name] and performed the following actions:  1. I explained that we are currently trying to limit exposure to the COVID-19 virus by seeing patients at home rather than in the office.  I explained that the visits are best done by video, but can be done by telephone.  I asked the patient if a virtual visit that the patient would like to try instead of coming into the office. [name] agreed to proceed with the virtual visit scheduled with Dr. Sherren Mocha Early on 11/23/2018.          If the patient said yes, I continued with 2 (below).  If the patient said no, I discussed other options and documented those and did not continue to 2.  2. I confirmed the BEST phone number to call the day of the visit and- I included this in appointment notes.         If the patient said yes, I documented "VIDEO" in the appointment notes.  If the patient said no, I documented "PHONE" in the appointment notes.  3. I confirmed consent by  a. sending through [place] or by email the [place] [facility] as written at the end of this message or  b. verbally as listed below. i. This visit is being performed in the setting of COVID-19. ii. All virtual visits are billed to your insurance company just like a normal visit would be.   iii. We'd like you to understand that the technology does not allow for your provider to perform an examination, and thus may limit your provider's ability to fully assess your condition.  iv. If your provider identifies any concerns that need to be evaluated in person, we will make arrangements to do so.   v. Finally, though the technology is pretty good, we cannot assure that it will always work on either your or our end, and in the setting of a video visit, we may have to convert it to a phone-only visit.  In either situation, we cannot ensure that we have a secure connection.   vi. Are you willing  to proceed?"  STAFF: Did the patient verbally acknowledge consent to telehealth visit? Document YES/NO here: YES  2. I advised the patient to be prepared - I asked that the patient, on the day of her visit, record any information possible with the equipment at her home, such as blood pressure, pulse, oxygen saturation, and your weight and write them all down. I asked the patient to have a pen and paper handy nearby the day of the visit as well.  3. If the patient was scheduled for a video visit, I informed the patient that the visit with the doctor would start with a text to the smartphone # given to [place] by the patient.          4. I Informed patient they will receive a phone call 15 minutes prior to their appointment time from a [facility] or nurse to review medications, allergies, etc. to prepare for the visit.    TELEPHONE CALL NOTE  [name] has been deemed a candidate for a follow-up tele-health visit to limit community exposure during the Covid-19 pandemic. I spoke with the patient via phone to ensure availability of phone/video source, confirm preferred email & phone number, and discuss instructions and expectations.  I reminded [name] to be prepared with any vital  sign and/or heart rhythm information that could potentially be obtained via home monitoring, at the time of her visit. I reminded [name] to expect a phone call prior to her visit.  Cleaster Corin, NT 11/22/2018 11:38 AM     FULL LENGTH CONSENT FOR TELE-HEALTH VISIT   I hereby voluntarily request, consent and authorize [facility] and its employed or contracted physicians, physician assistants, nurse practitioners or other licensed health care professionals (the Practitioner), to provide me with telemedicine health care services (the "Services") as deemed necessary by the treating Practitioner. I acknowledge and consent to receive the Services by the Practitioner via telemedicine. I understand that the  telemedicine visit will involve communicating with the Practitioner through live audiovisual communication technology and the disclosure of certain medical information by electronic transmission. I acknowledge that I have been given the opportunity to request an in-person assessment or other available alternative prior to the telemedicine visit and am voluntarily participating in the telemedicine visit.  I understand that I have the right to withhold or withdraw my consent to the use of telemedicine in the course of my care at any time, without affecting my right to future care or treatment, and that the Practitioner or I may terminate the telemedicine visit at any time. I understand that I have the right to inspect all information obtained and/or recorded in the course of the telemedicine visit and may receive copies of available information for a reasonable fee.  I understand that some of the potential risks of receiving the Services via telemedicine include:  Marland Kitchen Delay or interruption in medical evaluation due to technological equipment failure or disruption; . Information transmitted may not be sufficient (e.g. poor resolution of images) to allow for appropriate medical decision making by the Practitioner; and/or  . In rare instances, security protocols could fail, causing a breach of personal health information.  Furthermore, I acknowledge that it is my responsibility to provide information about my medical history, conditions and care that is complete and accurate to the best of my ability. I acknowledge that Practitioner's advice, recommendations, and/or decision may be based on factors not within their control, such as incomplete or inaccurate data provided by me or distortions of diagnostic images or specimens that may result from electronic transmissions. I understand that the practice of medicine is not an exact science and that Practitioner makes no warranties or guarantees regarding treatment  outcomes. I acknowledge that I will receive a copy of this consent concurrently upon execution via email to the email address I last provided but may also request a printed copy by calling the office of [facility].    I understand that my insurance will be billed for this visit.   I have read or had this consent read to me. . I understand the contents of this consent, which adequately explains the benefits and risks of the Services being provided via telemedicine.  . I have been provided ample opportunity to ask questions regarding this consent and the Services and have had my questions answered to my satisfaction. . I give my informed consent for the services to be provided through the use of telemedicine in my medical care  By participating in this telemedicine visit I agree to the above.

## 2018-11-22 NOTE — Telephone Encounter (Signed)
Letter has been written and has been faxed in attn [employer]. When looking at the fax number that was documented by Demetrio Lapping, it seems like one of the numbers was left off from the fax number for [employer] [employer].  Attempted to call pt to get the fax number from her so the letter can be faxed to Barnie Alderman for her but unable to reach pt. Left message for pt to return call.

## 2018-11-23 ENCOUNTER — Encounter: Payer: Self-pay | Admitting: Vascular Surgery

## 2018-11-23 ENCOUNTER — Ambulatory Visit (HOSPITAL_COMMUNITY)
Admission: RE | Admit: 2018-11-23 | Discharge: 2018-11-23 | Disposition: A | Discharge status: Home / Self Care | Payer: Medicare Other | Source: Ambulatory Visit | Attending: Vascular Surgery | Admitting: Vascular Surgery

## 2018-11-23 ENCOUNTER — Other Ambulatory Visit: Payer: Self-pay

## 2018-11-23 ENCOUNTER — Ambulatory Visit (INDEPENDENT_AMBULATORY_CARE_PROVIDER_SITE_OTHER): Payer: Medicare Other | Admitting: Vascular Surgery

## 2018-11-23 VITALS — BP 103/68 | HR 90 | Temp 97.8°F | Resp 20 | Ht 62.0 in | Wt 106.0 lb

## 2018-11-23 DIAGNOSIS — R609 Edema, unspecified: Secondary | ICD-10-CM | POA: Diagnosis present

## 2018-11-23 DIAGNOSIS — L03818 Cellulitis of other sites: Secondary | ICD-10-CM

## 2018-11-23 NOTE — Progress Notes (Signed)
[facility] [place]  Patient name: [name] MRN: [id number] DOB: 06/30/1945 Sex: female  REASON FOR CONSULT: Patient seen today for chronic lower extremity swelling.  She has a long history of this.  She does have marked skin changes that appear to potentially be extensive senile keratosis but is contemplating seeing a dermatologist for this.  She reports that her swelling is improved with diuresis.  She does wear compression garments when possible and elevate when possible  HPI: [name] is a 73 y.o. female, who is seen today for the above.  She denies any history of DVT.  She does have a long history of swelling.  She has very fragile skin on her upper extremities.  Past Medical History:  Diagnosis Date  . Abrasion    under left knee x 2 places changing dressing q day of bid, applying ssd cream area healing due to fell 2 weeks ago  . Blood dyscrasia    BLEEDS EASY  . Bruises easily    bleeds easily  . Closed patellar sleeve fracture of left knee 01/04/2018  . Closed patellar sleeve fracture of right knee 01/04/2018   healing   . COPD (chronic obstructive pulmonary disease) ([place])   . Coronary artery disease    per dr Philis Kendall note 01-01-18 note  . Hernia, inguinal    LEFT  . Hypertension    states under control with meds., has been on med. > 10 yr.  . On home oxygen therapy    at night 2L/min  . Pneumonia    "SEVERAL TIMES IN PAST, LAST TIME 2018"  . Pulmonary hypertension ([place])    per dr Terrence Dupont 7-19 19 lov note  . Shortness of breath dyspnea    with exertion  . SVD (spontaneous vaginal delivery)    x 1  . Thin skin   . Urinary tract infection 2019  . Wears partial dentures    lower partial and upper plate    Family History  Problem Relation Age of Onset  . Heart disease Father   . Kidney failure Father     SOCIAL HISTORY: Social History   Socioeconomic History  . Marital status: Widowed     Spouse name: Not on file  . Number of children: 1  . Years of education: Not on file  . Highest education level: Not on file  Occupational History  . Occupation: weaver  Scientific laboratory technician  . Financial resource strain: Not on file  . Food insecurity:    Worry: Not on file    Inability: Not on file  . Transportation needs:    Medical: Not on file    Non-medical: Not on file  Tobacco Use  . Smoking status: Former Smoker    Packs/day: 1.00    Years: 57.00    Pack years: 57.00    Types: Cigarettes    Last attempt to quit: 03/20/2017    Years since quitting: 1.6  . Smokeless tobacco: Never Used  . Tobacco comment: <1ppd 02/05/2016  Substance and Sexual Activity  . Alcohol use: Yes    Alcohol/week: 28.0 standard drinks    Types: 14 Cans of beer, 14 Shots of liquor per week    Comment: several beers/day and "a shot of whiskey"  . Drug use: No  . Sexual activity: Not Currently    Birth control/protection: Post-menopausal  Lifestyle  . Physical activity:    Days per week: Not on file  Minutes per session: Not on file  . Stress: Not on file  Relationships  . Social connections:    Talks on phone: Not on file    Gets together: Not on file    Attends religious service: Not on file    Active member of club or organization: Not on file    Attends meetings of clubs or organizations: Not on file    Relationship status: Not on file  . Intimate partner violence:    Fear of current or ex partner: Not on file    Emotionally abused: Not on file    Physically abused: Not on file    Forced sexual activity: Not on file  Other Topics Concern  . Not on file  Social History Narrative  . Not on file    Allergies  Allergen Reactions  . Tramadol Other (See Comments)    GI UPSET    Current Outpatient Medications  Medication Sig Dispense Refill  . albuterol (PROAIR HFA) 108 (90 Base) MCG/ACT inhaler 2 puffs every 4 hours as needed only  if your can't catch your breath (Patient taking  differently: Inhale 2 puffs into the lungs every 4 (four) hours as needed (if unable to catch breath). 2 puffs every 4 hours as needed only  if your can't catch your breath) 1 Inhaler 11  . albuterol (PROVENTIL) (2.5 MG/3ML) 0.083% nebulizer solution Take 3 mLs (2.5 mg total) by nebulization every 6 (six) hours as needed for wheezing or shortness of breath. 75 mL 12  . Ascorbic Acid (VITAMIN C WITH ROSE HIPS) 500 MG tablet Take 500 mg by mouth daily.    . budesonide-formoterol (SYMBICORT) 160-4.5 MCG/ACT inhaler Inhale 2 puffs into the lungs 2 (two) times daily. 1 Inhaler 0  . buPROPion (WELLBUTRIN SR) 150 MG 12 hr tablet Take 150 mg by mouth daily.     . cholecalciferol (VITAMIN D) 1000 UNITS tablet Take 1,000 Units by mouth daily.    Mar[place] Kitchen diltiazem (CARDIZEM CD) 360 MG 24 hr capsule Take 360 mg by mouth daily.   3  . furosemide (LASIX) 20 MG tablet Take 40 mg by mouth daily.     . Garlic 891 MG TABS Take 1 tablet by mouth daily.    Mar[place] Kitchen HYDROcodone-acetaminophen (NORCO/VICODIN) 5-325 MG tablet Take 1 tablet by mouth every 4 (four) hours as needed. (Patient taking differently: Take 1 tablet by mouth every 4 (four) hours as needed for moderate pain or severe pain. ) 30 tablet 0  . losartan (COZAAR) 100 MG tablet Take 100 mg by mouth daily.  0  . Multiple Minerals (CALCIUM-MAGNESIUM-ZINC) TABS Take 1 tablet by mouth daily.    . Multiple Minerals-Vitamins (CAL MAG ZINC +D3 PO) Take 1 tablet by mouth at bedtime.    . OXYGEN Inhale 3 L into the lungs. 24/7 DME- [facility]    . potassium chloride (K-DUR) 10 MEQ tablet Take 10 mEq by mouth daily as needed (swelling). When takes lasix   3  . Respiratory Therapy Supplies (FLUTTER) DEVI Use as directed 1 each 0  . SPIRIVA RESPIMAT 2.5 MCG/ACT AERS INHALE 2 PUFFS INTO THE LUNGS EVERY MORNING 4 g 3  . spironolactone (ALDACTONE) 25 MG tablet Take 1 tablet by mouth 2 (two) times daily.    Mar[place] Kitchen HYDROcodone-homatropine (HYCODAN) 5-1.5 MG/5ML syrup Take 5 mLs by mouth every  6 (six) hours as needed for cough. (Patient not taking: Reported on 11/23/2018) 120 mL 0   No current facility-administered medications for this visit.  REVIEW OF SYSTEMS:  _0  denotes positive finding, _1  denotes negative finding Cardiac  Comments:  Chest pain or chest pressure: x   Shortness of breath upon exertion:    Short of breath when lying flat:    Irregular heart rhythm:        Vascular    Pain in calf, thigh, or hip brought on by ambulation:    Pain in feet at night that wakes you up from your sleep:     Blood clot in your veins:    Leg swelling:  x       Pulmonary    Oxygen at home: x   Productive cough:     Wheezing:         Neurologic    Sudden weakness in arms or legs:     Sudden numbness in arms or legs:     Sudden onset of difficulty speaking or slurred speech:    Temporary loss of vision in one eye:     Problems with dizziness:         Gastrointestinal    Blood in stool:     Vomited blood:         Genitourinary    Burning when urinating:     Blood in urine:        Psychiatric    Major depression:         Hematologic    Bleeding problems:    Problems with blood clotting too easily:        Skin    Rashes or ulcers: x       Constitutional    Fever or chills:      PHYSICAL EXAM: Vitals:   11/23/18 1417  BP: 103/68  Pulse: 90  Resp: 20  Temp: 97.8 F (36.6 C)  SpO2: 98%  Weight: 106 lb (48.1 kg)  Height: _2  (1.575 m)    GENERAL: The patient is a well-nourished female, in no acute distress. The vital signs are documented above. CARDIOVASCULAR: 2+ radial and 2+ popliteal pulses bilaterally.  I do not palpate pedal pulses due to very thickening skin of her skin on her feet.  I did listen with hand-held Doppler and she has normal triphasic Doppler signals in both feet PULMONARY: There is good air exchange  ABDOMEN: Soft and non-tender  MUSCULOSKELETAL: There are no major deformities or cyanosis. NEUROLOGIC: No focal weakness or  paresthesias are detected. SKIN: There are no ulcers or rashes noted. PSYCHIATRIC: The patient has a normal affect.  DATA:  Venous study revealed abnormal reflux study in the common femoral vein with some mild reflux in the great saphenous vein proximally but no dilatation and no DVT  MEDICAL ISSUES: Discussed this at length with the patient.  I do not see any evidence of significant venous pathology and reassured her that she does not have any arterial insufficiency.  I explained the treatment would be continue diuresis which is improving condition and also elevation and compression when possible.  She was reassured with this discussion will see [place] again on an as-needed basis   Rosetta Posner, MD [facility] Tel [phone] Pager [phone]

## 2018-12-13 ENCOUNTER — Telehealth: Payer: Self-pay | Admitting: Internal Medicine

## 2018-12-13 NOTE — Telephone Encounter (Signed)
Done and given to patrice

## 2018-12-14 NOTE — Telephone Encounter (Signed)
Rec'd completed paperwork - Fwd to [place] via interoffice mail -pr

## 2018-12-21 ENCOUNTER — Encounter: Payer: Medicare Other | Admitting: Vascular Surgery

## 2018-12-21 ENCOUNTER — Encounter (HOSPITAL_COMMUNITY): Payer: Medicare Other

## 2019-01-12 ENCOUNTER — Encounter: Payer: Self-pay | Admitting: *Deleted

## 2019-01-12 ENCOUNTER — Other Ambulatory Visit: Payer: Self-pay

## 2019-01-12 ENCOUNTER — Encounter: Payer: Self-pay | Admitting: Internal Medicine

## 2019-01-12 ENCOUNTER — Ambulatory Visit (INDEPENDENT_AMBULATORY_CARE_PROVIDER_SITE_OTHER): Payer: Medicare Other | Admitting: Internal Medicine

## 2019-01-12 DIAGNOSIS — J449 Chronic obstructive pulmonary disease, unspecified: Secondary | ICD-10-CM

## 2019-01-12 DIAGNOSIS — J9612 Chronic respiratory failure with hypercapnia: Secondary | ICD-10-CM | POA: Diagnosis not present

## 2019-01-12 DIAGNOSIS — I2781 Cor pulmonale (chronic): Secondary | ICD-10-CM

## 2019-01-12 DIAGNOSIS — J9611 Chronic respiratory failure with hypoxia: Secondary | ICD-10-CM | POA: Diagnosis not present

## 2019-01-12 MED ORDER — SPIRIVA RESPIMAT 2.5 MCG/ACT IN AERS: INHALATION_SPRAY | RESPIRATORY_TRACT | 3 refills | Status: DC

## 2019-01-12 NOTE — Patient Instructions (Signed)
For increased / wheezing/ cough or need for albuterol > prednisone 10 mg x 2 until better then 1 daily x 3 days and stop   Monitor your 02 level with exertion with goal of keeping over 90%    Please schedule a follow up visit in 6  months but call sooner if needed  - unable to work in meantime due to severe copd in [place] pandemic

## 2019-01-12 NOTE — Progress Notes (Signed)
Subjective:   Patient ID: [name], female    DOB: 04-11-1946   MRN: [id number]  Brief patient profile:  73 yowf MM quit smoking 03/2017  followed in pulmonary clinic for GOLD III copd   History of Present Illness  11/05/2015  f/u ov/[name] re: GOLD III / still smoking / maint rx symb/spriiva dpi  Chief Complaint  Patient presents with  . Follow-up    pt states breathing is baseline. c/o cont SOB, prod cough tan to greenish in color, wheezing, cp/tightness  dyspnea is variable and has portable tanks but never uses them to see if makes any difference in her variable doe / using saba up to 4 x daily instead.   rec You are qualified for portable 02 if you wish but no need to wear it at rest or walking around the house - continue it at bedtime thru the concentrator at 2lpm and wear the portable system as needed during the day Ok to finish up the powder spiriva and just refill the respimat    12/24/2016  f/u ov/[name] re:  COPD GOLD III/ still smoking/ symb/spiriva / 02 2lpm hs only  Chief Complaint  Patient presents with  . Follow-up    Pt states she is still having increase sob with exerton, she still has a slight cough with clear mucus with a tint of blood, lots of wheezing,Denies chest tightness,fever   using saba hfa up to twice daily but no needing noct Doe = MMRC3 = can't walk 100 yards even at a slow pace at a flat grade s stopping due to sob   Has slt bloody nasal discharge in am and scant blood streaked sputum also, just in am and just when nose is bleeding too    11/11/2017  f/u ov/[name] re:  GOLD III/ 02 hs prn  Chief Complaint  Patient presents with  . Follow-up    Breathing is about the same. She is using her albuterol inhaler 2-3 x per wk on average and she rarely uses neb.   Dyspnea:   No change in doe = MMRC3  Cough: not much at all  Sleep: flat on 2lpm  SABA use:  As above Sleeping  Ok on 2lpm flat   rec Discuss the fluid pills with  [name] but I will  recommend he consider adding aldactone if not improving on lasix alone but have to be very careful with potassium monitoring with these changes so I will defer to him  Please schedule a follow up visit in 6 months but call sooner if needed - referred to rehab     05/03/18 acute ov/ [name] rec Seen  for COPD exacerbation secondary to URI Office treatment: Xopenex neb treatment x1 today Rx: Prednisone taper as prescribed Z-Pak as prescribed Recommendations: Used Albuterol nebulizer every 6 hours as needed for shortness of breath/wheezing for the next 3 to 5 days until better You can return to work as long as you are afebrile for 24 hours Stay well-hydrated, encourage 4-6 glass of fluids (8 oz)    Admit date: 05/08/2018 Discharge date: 05/20/2018   Discharge Diagnoses:      Active [name] Problems   Diagnosis Date Noted  . COPD with acute exacerbation ([name]) 05/09/2018  . Hyponatremia 05/09/2018  . Acute urinary retention 05/09/2018  . Chronic respiratory failure with hypoxia ([name]) 06/30/2014  . Essential hypertension 03/28/2012    History of present illness:  Crystal Klingbeil [name] a 73 y.o.femalewith medical history significant forCOPD  with chronic hypoxic respiratory failure, hypertension, and chronic bilateral leg swelling and ulcerations, now presenting to the emergency department for evaluation of shortness of breath and productive cough. Patient was seen in her pulmonology clinic on 05/03/2018 with productive cough and increased dyspnea, was diagnosed with acute exacerbation in COPD and started on prednisone and azithromycin. She failed to improve, was seen back on 05/06/2018 and reports that her prednisone [name] was extended. Unfortunately, she still has not had any significant improvement and may actually be worsening. She has been dyspneic at rest for the past couple days and has had difficulty sleeping due to this. She denies fevers, chills, or chest pain.  Reports that her chronic bilateral lower extremity swelling is not as bad as usual. Pt admitted for further management   Today, pt reported feeling better, was able to ambulate with PT, with noted desaturations requiring home O2. Pt advised to use home O2 constantly (at rest and ambulation), not just at bedtime. Pt discharged on a long taper of steroids and advised to follow up closely with PCP and pulmonary. Stable for d/c with [name]:  Principal Problem:   COPD with acute exacerbation ([place]) Active Problems:   Essential hypertension   Chronic respiratory failure with hypoxia ([facility])   Hyponatremia   Acute urinary retention  Acute on chronic hypoxic respiratory failure with COPD Exacerbation -Presented with worsening shortness of breath, cough despite being treated with prednisone and azithromycin since 05/03/2018 - long [name] stay with slow improvement, admitted on 11/23 and required BIPAP at admission, and has completed [name] of abx with levaquin on 11/27. She's been transitioned from IV solumedrol to PO prednisone -Continue duoneb, inhaler, tapered dose of prednisone -Completed [name] of Levaquin  -Continues to require supplemental O2 (3 L now, was previously on 2 L at night only) -CXR 11/30, showing improvement -Follow up with PCP and [name] (pulmonary)  Hyponatremia -resolved  Essential hypertension -Continue diltiazem, losartan  Alcohol use -Continue folic acid, thiamine, multivitamin   Leukocytosis  -likely due to steroids  Hyperglycemia -no history of diabetes  -hemoglobin A1c 6.1  Deconditioning -secondary to respiratory status/illness -PT evaluated patient, no further needs -OT recommended [name]     06/01/2018  Post hosp f/u ov/[name] re: transition of care:   [name]/ 02 dep 24/7  Chief Complaint  Patient presents with  . Hospitalization Follow-up    Breathing has improved some but not back to her normal baseline. She is  using her o2 3lpm 24/7.  She has not used her albuterol inhaler but has been using her albuterol neb once daily on average.   Dyspnea:  MMRC4  = sob if tries to leave home or while getting dressed   Cough: some rattling  Sleeping: horizontal on back ok SABA use: avg neb once  02: 3lpm 24/7 rec 3lpm at bedtime, none at rest and turn up your 02 to keep it above 90% with activity  Plan A = Automatic = symbicort and spiriva  Plan B = Backup Only use your albuterol inhaler as a rescue medication   Plan C = Crisis - only use your albuterol nebulizer if you first try Plan B and it fails to help > ok to use the nebulizer up to every 4 hours but if start needing it regularly call for immediate appointment For cough/ congestion >  mucinex or mucinex dm up to 1200 mg every 12 hours with flutter valve  Work on inhaler technique:  If not  improving >  Prednisone 10 mg take  4 each am x 2 days,   2 each am x 2 days,  1 each am x 2 days and stop         08/11/2018  Acute ov/Annisa Mazzarella re:  Copd GOLD III/ cor pulmonale:  symb 160 /spiriva / 02 hs and prn day Has pred as plan D but confused when and how to use it  Chief Complaint  Patient presents with  . Acute Visit    runny nose- min green nasal d/c and increased cough- started last night. She is feeling more SOB today.   Dyspnea:  No increase Cough: more congested in am and turning slt more yellow on day of ov  Sleeping: bed is flat/ one pillow SABA use: not using at all despite  02: 3lpm hs and as needed during the day but not titrating as rec  rec  For increased / wheezing/ cough or need for albuterol > prednisone 10 mg x 2 until better then 1 daily x 3 days and stop Monitor your 02 level with exertion with goal of keeping over 90%      01/12/2019  f/u ov/Cherylynn Liszewski re: GOLD III/ cor pulmonale maint on symb/spiriva  Chief Complaint  Patient presents with  . Follow-up    F/U GE:[employer]BMW. She reports her breathing has not been well with the hot weather.  Currenlty taking Keflex for left leg cellulitis and knee infection.   Dyspnea:  Not going out much / room to room ok Cough: still has "smoker's rattle" cough worse in am >  thick white 1-2 tsp Sleeping: bed is flat, one pillow SABA use: sev times a day when overdoes it  02: 3lpm hs and prn daytime    No obvious day to day or daytime variability or assoc excess/ purulent sputum or mucus plugs or hemoptysis or cp or chest tightness, subjective wheeze or overt sinus or hb symptoms.   Sleeping flat/ one pillow  without nocturnal  exacerbation  of respiratory  c/o's or need for noct saba. Also denies any obvious fluctuation of symptoms with weather or environmental changes or other aggravating or alleviating factors except as outlined above   No unusual exposure hx or h/o childhood pna/ asthma or knowledge of premature birth.  Current Allergies, Complete Past Medical History, Past Surgical History, Family History, and Social History were reviewed in [employer] record.  ROS  The following are not active complaints unless bolded Hoarseness, sore throat, dysphagia, dental problems, itching, sneezing,  nasal congestion or discharge of excess mucus or purulent secretions, ear ache,   fever, chills, sweats, unintended wt loss or wt gain, classically pleuritic or exertional cp,  orthopnea pnd or arm/hand swelling  or leg swelling, presyncope, palpitations, abdominal pain, anorexia, nausea, vomiting, diarrhea  or change in bowel habits or change in bladder habits, change in stools or change in urine, dysuria, hematuria,  rash, arthralgias, visual complaints, headache, numbness, weakness or ataxia or problems with walking or coordination,  change in mood or  memory.        Current Meds  Medication Sig  . albuterol (PROAIR HFA) 108 (90 Base) MCG/ACT inhaler 2 puffs every 4 hours as needed only  if your can't catch your breath (Patient taking differently: Inhale 2 puffs into the lungs  every 4 (four) hours as needed (if unable to catch breath). 2 puffs every 4 hours as needed only  if your can't catch your breath)  . albuterol (  PROVENTIL) (2.5 MG/3ML) 0.083% nebulizer solution Take 3 mLs (2.5 mg total) by nebulization every 6 (six) hours as needed for wheezing or shortness of breath.  . Ascorbic Acid (VITAMIN C WITH ROSE HIPS) 500 MG tablet Take 500 mg by mouth daily.  . budesonide-formoterol (SYMBICORT) 160-4.5 MCG/ACT inhaler Inhale 2 puffs into the lungs 2 (two) times daily.  Marland Kitchen buPROPion (WELLBUTRIN SR) 150 MG 12 hr tablet Take 150 mg by mouth daily.   . cholecalciferol (VITAMIN D) 1000 UNITS tablet Take 1,000 Units by mouth daily.  Marland Kitchen diltiazem (CARDIZEM CD) 360 MG 24 hr capsule Take 360 mg by mouth daily.   . furosemide (LASIX) 20 MG tablet Take 40 mg by mouth daily.   . Garlic 379 MG TABS Take 1 tablet by mouth daily.  Marland Kitchen HYDROcodone-acetaminophen (NORCO/VICODIN) 5-325 MG tablet Take 1 tablet by mouth every 4 (four) hours as needed. (Patient taking differently: Take 1 tablet by mouth every 4 (four) hours as needed for moderate pain or severe pain. )  . HYDROcodone-homatropine (HYCODAN) 5-1.5 MG/5ML syrup Take 5 mLs by mouth every 6 (six) hours as needed for cough.  . losartan (COZAAR) 100 MG tablet Take 100 mg by mouth daily.  . Multiple Minerals (CALCIUM-MAGNESIUM-ZINC) TABS Take 1 tablet by mouth daily.  . Multiple Minerals-Vitamins (CAL MAG ZINC +D3 PO) Take 1 tablet by mouth at bedtime.  . OXYGEN Inhale 3 L into the lungs. 24/7 DME- [facility]  . potassium chloride (K-DUR) 10 MEQ tablet Take 10 mEq by mouth daily as needed (swelling). When takes lasix   . Respiratory Therapy Supplies (FLUTTER) DEVI Use as directed  . SPIRIVA RESPIMAT 2.5 MCG/ACT AERS INHALE 2 PUFFS INTO THE LUNGS EVERY MORNING  . spironolactone (ALDACTONE) 25 MG tablet Take 1 tablet by mouth 2 (two) times daily.           Objective:  Physical Exam    Thin amb wf nad     01/12/2019     110   09/26/2013  101>102 10/11/2013 > 01/16/2014  99 > 04/06/2014  103 >105 06/30/2014 > 08/11/2014  101 > 11/09/2014  104 > 01/22/2015 106  > 02/06/2015 105 > 03/08/2015 106 > 04/18/2015  106 > 06/15/2015 103 > 09/14/2015   102 > 10/08/2015   103 > 11/05/2015  102 > 02/05/2016  103 > 05/14/2016 102 > 09/24/2016  99 > 12/24/2016  99 > 06/29/2017  104 > 09/28/2017  106 > 11/11/2017 104 > 02/03/2018  107 > 05/06/2018   105 > 06/01/2018  111> 07/14/2018  110 > 08/11/2018   109     Vital signs reviewed - Note on arrival 02 sats  95% on RA    HEENT: nl dentition / oropharynx. Nl external ear canals without cough reflex -  Mild bilateral non-specific turbinate edema     NECK :  without JVD/Nodes/TM/ nl carotid upstrokes bilaterally   LUNGS: no acc muscle use,  Mod barrel  contour chest wall with bilateral  Distant bs s audible wheeze and  without cough on insp or exp maneuver and mod  Hyperresonant  to  percussion bilaterally     CV:  RRR  no s3 or murmur or increase in P2, and no edema   ABD:  soft and nontender with pos mid insp Hoover's  in the supine position. No bruits or organomegaly appreciated, bowel sounds nl  MS:     ext warm without deformities, calf tenderness, cyanosis or clubbing No obvious joint restrictions   SKIN:  warm and dry without lesions    NEURO:  alert, approp, nl sensorium with  no motor or cerebellar deficits apparent.                         Assessment & Plan:

## 2019-01-15 ENCOUNTER — Encounter: Payer: Self-pay | Admitting: Internal Medicine

## 2019-01-15 NOTE — Assessment & Plan Note (Addendum)
Quit smoking 03/2017 pfts 11/2011   FEV1 0.86 (42%) ratio 35  - alpha one AT screening 03/24/12   MM  Level 166  - 09/14/2015  hfa Arlana Pouch 11/05/2015 (when spiriva dpi supply runs out) - flutter valve added 02/05/2016  And recoached 05/14/2016  - PFT's  06/29/2017  FEV1 0.92 (44 % ) ratio 44  p 11 % improvement from saba p symb/spiriva prior to study with DLCO  33/33 % corrects to 35 % for alv volume   - 11/11/2017 referred to rehab >   did not go 05/03/2018 - COPD exac d/t URI, RX zpack and prednisone taper > failed outpt rx and admitted  - 08/11/2018 added pred as plan D     01/12/2019  After extensive coaching inhaler device,  effectiveness =    75% / reviewed how to use prednisone as plan D see avs for instructions unique to this ov     No changes feasible

## 2019-01-15 NOTE — Assessment & Plan Note (Addendum)
Clinical dx only, well compensated at present   Pt informed of the seriousness of COVID 19 infection as a direct risk to their health  and safey and to those of their loved ones and should continue to wear facemask in public and minimize exposure to public locations but especially avoid any area or activity where non-close contacts are not observing distancing or wearing an appropriate face mask > she tells me this is not feasible at her workplace so rec she not work until covid restrictions lifted.   I had an extended discussion with the patient reviewing all relevant studies completed to date and  lasting 15 to 20 minutes of a 25 minute visit    I performed detailed device teaching using a teach back method which extended face to face time for this visit (see above)  Each maintenance medication was reviewed in detail including emphasizing most importantly the difference between maintenance and prns and under what circumstances the prns are to be triggered using an action plan format that is not reflected in the computer generated alphabetically organized AVS which I have not found useful in most complex patients, especially with respiratory illnesses  Please see AVS for specific instructions unique to this visit that I personally wrote and verbalized to the the pt in detail and then reviewed with pt  by my nurse highlighting any  changes in therapy recommended at today's visit to their plan of care.

## 2019-01-15 NOTE — Assessment & Plan Note (Signed)
Exertional desats 06/30/2014 >O2 2lm rx with act  - 11/09/2014  Walked RA  2 laps @ 185 ft each stopped due to  Sat down to 88 % at nl pace but no sob  - ono RA  09/22/15  desat < 89% x 1:65mn > rec continue 02 2lpm - 45/18/8416certification: Patient Saturations on Room Air at Rest = 94% and while Ambulating 2 laps x 185 each= 87% Patient Saturations on 2Liters of oxygen while Ambulating =96% x one additional lap - 11/05/2015  Walked RA x 4 laps @ 185 ft each stopped due to  desat to 87% resolved on 2lpm   - 06/29/2017  Walked RA x 3 laps @ 185 ft each stopped due to  End of study/ slow pace, no desat - 11/11/2017  Walked RA x 3 laps @ 185 ft each stopped due to  End of study,  no  desat  < 90% at moderate to moderately  fast pace   - HCO3  05/20/18  = 34    - 07/14/2018 Room Air at Rest = 96% Patient Saturations on Room Air while Ambulating = 88% Patient Saturations on 3 Liters of oxygen while Ambulating = 95% - ono on 2lpm completed 07/20/18  desat x 132 min so 07/22/2018 rec  repeat ono on 3lpm> done 07/30/18 on 3lpm = 10.6 m at < 89% so ok to continue 3lpm hs  -    As of 01/12/2019  3lpm hs  And 3lpm walking more than around the house with goal of > 90% at all times due to cor pulmonale

## 2019-02-04 ENCOUNTER — Encounter (HOSPITAL_BASED_OUTPATIENT_CLINIC_OR_DEPARTMENT_OTHER): Payer: Medicare Other | Attending: Internal Medicine

## 2019-02-04 DIAGNOSIS — L97812 Non-pressure chronic ulcer of other part of right lower leg with fat layer exposed: Secondary | ICD-10-CM | POA: Diagnosis not present

## 2019-02-04 DIAGNOSIS — I87323 Chronic venous hypertension (idiopathic) with inflammation of bilateral lower extremity: Secondary | ICD-10-CM | POA: Insufficient documentation

## 2019-02-04 DIAGNOSIS — Z9981 Dependence on supplemental oxygen: Secondary | ICD-10-CM | POA: Diagnosis not present

## 2019-02-04 DIAGNOSIS — I10 Essential (primary) hypertension: Secondary | ICD-10-CM | POA: Insufficient documentation

## 2019-02-04 DIAGNOSIS — I890 Lymphedema, not elsewhere classified: Secondary | ICD-10-CM | POA: Insufficient documentation

## 2019-02-04 DIAGNOSIS — Z87891 Personal history of nicotine dependence: Secondary | ICD-10-CM | POA: Diagnosis not present

## 2019-02-04 DIAGNOSIS — J449 Chronic obstructive pulmonary disease, unspecified: Secondary | ICD-10-CM | POA: Insufficient documentation

## 2019-02-04 DIAGNOSIS — S8002XA Contusion of left knee, initial encounter: Secondary | ICD-10-CM | POA: Diagnosis not present

## 2019-02-04 DIAGNOSIS — W19XXXA Unspecified fall, initial encounter: Secondary | ICD-10-CM | POA: Insufficient documentation

## 2019-02-09 ENCOUNTER — Telehealth: Payer: Self-pay | Admitting: Internal Medicine

## 2019-02-09 NOTE — Telephone Encounter (Signed)
Left message for patient to call back.

## 2019-02-10 MED ORDER — PREDNISONE 10 MG PO TABS: 10.0000 mg | ORAL_TABLET | Freq: Every day | ORAL | 1 refills | Status: DC

## 2019-02-10 MED ORDER — BUDESONIDE-FORMOTEROL FUMARATE 160-4.5 MCG/ACT IN AERO: 2.0000 | INHALATION_SPRAY | Freq: Two times a day (BID) | RESPIRATORY_TRACT | 0 refills | Status: DC

## 2019-02-10 MED ORDER — SPIRIVA RESPIMAT 2.5 MCG/ACT IN AERS: 2.0000 | INHALATION_SPRAY | Freq: Every day | RESPIRATORY_TRACT | 0 refills | Status: DC

## 2019-02-10 NOTE — Telephone Encounter (Signed)
As of - 08/11/2018 added pred as plan D   That means she should have refillable rx but it looks lke it's been deleted off med list   rx Pred 10 mg x 100  2 qam until better then 1 daily x 5 days and stop

## 2019-02-10 NOTE — Telephone Encounter (Signed)
Spoke with the pt and notified of recs per MW  She verbalized understanding  Rx was sent to pharm  Pt aware samples and pt assistance up front for pick up

## 2019-02-10 NOTE — Telephone Encounter (Signed)
Called and spoke to pt. Pt requesting prednisone taper. Pt had left over pred and took 82m yesterday and today (doesn't have any left). Pt c/o increase in SOB, prod cough with thick mucus (pt unsure of color), chest tightness - pt states she has noticed a slight improvement since taking the prednisone. Pt states she is also on Keflex from a would on her left knee. She states she has two days left of abx. Pt denies fever. Pt state she always has some lower extremity edema but this is not worse than her baseline. Pt also requesting samples of Symbicort and Spiriva, these have been left up front for pick up with pt assistance paperwork.   Dr. WMelvyn Novasplease advise on prednisone. Thanks.

## 2019-02-11 DIAGNOSIS — L97812 Non-pressure chronic ulcer of other part of right lower leg with fat layer exposed: Secondary | ICD-10-CM | POA: Diagnosis not present

## 2019-02-18 ENCOUNTER — Encounter (HOSPITAL_BASED_OUTPATIENT_CLINIC_OR_DEPARTMENT_OTHER): Payer: Medicare Other | Attending: Internal Medicine

## 2019-02-18 DIAGNOSIS — J449 Chronic obstructive pulmonary disease, unspecified: Secondary | ICD-10-CM | POA: Diagnosis not present

## 2019-02-18 DIAGNOSIS — I10 Essential (primary) hypertension: Secondary | ICD-10-CM | POA: Diagnosis not present

## 2019-02-18 DIAGNOSIS — I890 Lymphedema, not elsewhere classified: Secondary | ICD-10-CM | POA: Diagnosis not present

## 2019-02-18 DIAGNOSIS — S8002XA Contusion of left knee, initial encounter: Secondary | ICD-10-CM | POA: Insufficient documentation

## 2019-02-18 DIAGNOSIS — L97812 Non-pressure chronic ulcer of other part of right lower leg with fat layer exposed: Secondary | ICD-10-CM | POA: Insufficient documentation

## 2019-02-18 DIAGNOSIS — I87323 Chronic venous hypertension (idiopathic) with inflammation of bilateral lower extremity: Secondary | ICD-10-CM | POA: Insufficient documentation

## 2019-02-18 DIAGNOSIS — W19XXXA Unspecified fall, initial encounter: Secondary | ICD-10-CM | POA: Insufficient documentation

## 2019-02-25 DIAGNOSIS — L97812 Non-pressure chronic ulcer of other part of right lower leg with fat layer exposed: Secondary | ICD-10-CM | POA: Diagnosis not present

## 2019-03-10 ENCOUNTER — Other Ambulatory Visit (HOSPITAL_COMMUNITY)
Admission: RE | Admit: 2019-03-10 | Discharge: 2019-03-10 | Disposition: A | Discharge status: Home / Self Care | Payer: Medicare Other | Source: Other Acute Inpatient Hospital | Attending: Internal Medicine | Admitting: Internal Medicine

## 2019-03-10 DIAGNOSIS — S8002XD Contusion of left knee, subsequent encounter: Secondary | ICD-10-CM | POA: Insufficient documentation

## 2019-03-10 DIAGNOSIS — L97812 Non-pressure chronic ulcer of other part of right lower leg with fat layer exposed: Secondary | ICD-10-CM | POA: Diagnosis not present

## 2019-03-13 LAB — AEROBIC CULTURE W GRAM STAIN (SUPERFICIAL SPECIMEN): Gram Stain: NONE SEEN

## 2019-03-13 LAB — AEROBIC CULTURE? (SUPERFICIAL SPECIMEN)

## 2019-03-17 ENCOUNTER — Encounter (HOSPITAL_BASED_OUTPATIENT_CLINIC_OR_DEPARTMENT_OTHER): Payer: Medicare Other | Attending: Internal Medicine | Admitting: Internal Medicine

## 2019-03-17 ENCOUNTER — Other Ambulatory Visit: Payer: Self-pay

## 2019-03-17 DIAGNOSIS — L97812 Non-pressure chronic ulcer of other part of right lower leg with fat layer exposed: Secondary | ICD-10-CM | POA: Diagnosis not present

## 2019-03-17 DIAGNOSIS — I10 Essential (primary) hypertension: Secondary | ICD-10-CM | POA: Insufficient documentation

## 2019-03-17 DIAGNOSIS — I87331 Chronic venous hypertension (idiopathic) with ulcer and inflammation of right lower extremity: Secondary | ICD-10-CM | POA: Insufficient documentation

## 2019-03-17 DIAGNOSIS — I890 Lymphedema, not elsewhere classified: Secondary | ICD-10-CM | POA: Diagnosis not present

## 2019-03-17 DIAGNOSIS — J449 Chronic obstructive pulmonary disease, unspecified: Secondary | ICD-10-CM | POA: Insufficient documentation

## 2019-03-17 DIAGNOSIS — S8002XA Contusion of left knee, initial encounter: Secondary | ICD-10-CM | POA: Insufficient documentation

## 2019-03-17 DIAGNOSIS — W19XXXA Unspecified fall, initial encounter: Secondary | ICD-10-CM | POA: Insufficient documentation

## 2019-03-17 DIAGNOSIS — I87322 Chronic venous hypertension (idiopathic) with inflammation of left lower extremity: Secondary | ICD-10-CM | POA: Diagnosis not present

## 2019-03-17 NOTE — Progress Notes (Signed)
SOLARIS, KRAM ([id number]) Visit Report for 03/17/2019 HPI Details Patient Name: Date of Service: [name], [name] 03/17/2019 1:15 PM Medical Record [id number] Patient Account Number: [id number] Date of Birth/Sex: Treating RN: 27-Nov-1945 (73 y.o. 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 6 weeks ago she had a fall and suffered a contusion on her left anterior patella area. She saw her primary doctor she has been prescribed Silvadene cream and cephalexin on 8/7 for 1 week. The area is not progressing towards closure and she is here for our review of this. Incidentally we were able to also discover an area on the right anterior lower tibial area which is a small wound that we think may have been there for about 1 week. The patient saw Dr. early on 11/23/2018. He did not think the patient had any arterial issues based on triphasic waveforms with his hand-held Dopplers. She had venous reflux studies that showed abnormal reflux times in the common femoral vein, great saphenous vein in the distal thigh great saphenous vein at the knee and great saphenous vein at the mid calf similar findings on the left but he did not think that she was a candidate for ablation stating that she had mild reflux in the great saphenous vein proximally but no dilation. There was no DVT Past medical history includes COPD, pulmonary hypertension, coronary disease, hypertension, chronic lower extremity edema ABIs in our clinic were 1.02 on the right and 0.86 on the left 8/28; patient readmitted to the clinic last week. Traumatic wound on the left anterior patella in the setting of  fairly significant chronic venous changes. She also has a small area on the right anterior mid tibia area. 9/4; traumatic wound on the left anterior patella. We have been using Medihoney as the patient could not afford Santyl. This is really not doing improving things. We moved to Iodoflex today. Also unfortunately the area on the right anterior mid tibia is not healed 9/24; traumatic wound on the left anterior patella had a greenish drainage on the dressing. This is led to a culture being done of this area. I changed to Iodoflex last time this may have something to do with the color. The area on the right anterior tibia is actually larger. 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 This looks somewhat better. He has an area just below this on the left which is superficial. Finally she has the area on the right anterior pretibial area which looks better and more superficial. Extremely damaged skin on the bilateral lower extremities I think which is mostly secondary to longstanding lymphedema. She does not want the right leg wrapped largely because she is developing fluid above the wraps just below her knee and in the thigh. I am concerned about not wrapping it however she has made her wishes clear Electronic Signature(s) Signed: 03/17/2019 6:13:48 PM By: Linton Ham MD Entered By: Linton Ham on 03/17/2019 15:17:26 -------------------------------------------------------------------------------- Physical Exam Details Patient Name: Date of Service: [name], [name] 03/17/2019 1:15 PM Medical Record [id number] Patient Account Number: [id number] Date of Birth/Sex: Treating RN: 1946-01-06 (73 y.o. [name] Primary Care Provider: Redmond Pulling,  Jama Flavors Other Clinician: Referring Provider: Treating Provider/Extender:Cherylyn Sundby,  [place]end[place] Chard, Jama Flavors Weeks in Treatment: 5 Constitutional Patient is hypertensive.. Pulse regular and within target range for patient.Marland Kitchen Respirations regular, non-labored and within target range.. Temperature is normal and within the target range for the patient.Marland Kitchen Appears in no distress. Eyes Conjunctivae clear. No discharge.no icterus. Respiratory work of breathing is normal. Cardiovascular Pedal pulses palpable. Patient has compression edema just above where the wrap would be and also mostly in her posterior right thigh. Integumentary (Hair, Skin) Extremely damaged bilateral lower extremity skin which is likely elephantiasis from chronic venous hypertension with secondary lymphedema. Psychiatric appears at normal baseline. Notes Wound examwound on the left patella is still present appears to have a healthier surface. Superficial area on the right anterior pre-tibia also looks better superficial healthy granulation. Electronic Signature(s) Signed: 03/17/2019 6:13:48 PM By: Linton Ham MD Entered By: Linton Ham on 03/17/2019 15:23:15 -------------------------------------------------------------------------------- Physician Orders Details Patient Name: Date of Service: SARGUN, RUMMELL 03/17/2019 1:15 PM Medical Record [id number] Patient Account Number: [id number] Date of Birth/Sex: Treating RN: [birth year] (73 y.o. [name] Primary Care Provider: Christain [place] Other Clinician: Referring Provider: Treating Provider/Extender:Ahja Martello, Glendale Chard, Letta Moynahan in Treatment: 5 Verbal / Phone Orders: No Diagnosis Coding ICD-10 Coding Code Description S80.02XD Contusion of left knee, subsequent encounter I87.323 Chronic venous hypertension (idiopathic) with inflammation of bilateral lower extremity I89.0 Lymphedema, not elsewhere classified L97.511 Non-pressure chronic ulcer of other part of right foot limited to breakdown of skin Follow-up  Appointments Return Appointment in 1 week. Dressing Change Frequency Wound #1 Left,Anterior Knee Change Dressing every other day. Wound #2 Right,Anterior Lower Leg Change Dressing every other day. Wound #3 Left,Medial Knee Change Dressing every other day. Skin Barriers/Peri-Wound Care Moisturizing lotion - to legs Wound Cleansing Wound #1 Left,Anterior Knee May shower and wash wound with soap and water. - scrub gently with clean washcloth or gauze Wound #2 Right,Anterior Lower Leg May shower and wash wound with soap and water. - scrub gently with clean washcloth or gauze Wound #3 Left,Medial Knee May shower and wash wound with soap and water. - scrub gently with clean washcloth or gauze Primary Wound Dressing Wound #1 Left,Anterior Knee Calcium Alginate with Silver Wound #2 Right,Anterior Lower Leg Hydrofera Blue Wound #3 Left,Medial Knee Calcium Alginate with Silver Secondary Dressing Wound #1 Left,Anterior Knee Foam Border Wound #2 Right,Anterior Lower Leg Dry Gauze Wound #3 Left,Medial Knee Foam Border Edema Control Avoid standing for long periods of time Elevate legs to the level of the heart or above for 30 minutes daily and/or when sitting, a frequency of: - throughout the day Exercise regularly Support Garment 20-30 mm/Hg pressure to: - patient to purchase compression stockings from elastic therapy. Apply in the am and remove at night. Electronic Signature(s) Signed: 03/17/2019 6:13:48 PM By: Linton Ham MD Signed: 03/17/2019 6:31:45 PM By: Deon Pilling Entered By: Deon Pilling on 03/17/2019 14:24:48 -------------------------------------------------------------------------------- Problem List Details Patient Name: Date of Service: [name]. 03/17/2019 1:15 PM Medical Record [id number] Patient Account Number: [id number] Date of Birth/Sex: Treating RN: 07-May-1946 (73 y.o. [name] Primary Care Provider: Christain [place] Other  Clinician: Referring Provider: Treating Provider/Extender:Yoshie Kosel, Glendale Chard, Letta Moynahan in Treatment: 5 Active Problems ICD-10 Evaluated Encounter Code Description Active Date Today Diagnosis S80.02XD Contusion of left knee, subsequent encounter 02/04/2019 No Yes I87.323 Chronic venous hypertension (idiopathic) with 02/04/2019 No Yes inflammation of bilateral lower extremity I89.0 Lymphedema, not elsewhere classified 02/04/2019 No Yes L97.511 Non-pressure  chronic ulcer of other part of right foot 02/11/2019 No Yes limited to breakdown of skin Inactive Problems Resolved Problems Electronic Signature(s) Signed: 03/17/2019 6:13:48 PM By: Linton Ham MD Entered By: Linton Ham on 03/17/2019 15:15:00 -------------------------------------------------------------------------------- Progress Note Details Patient Name: Date of Service: [name]. 03/17/2019 1:15 PM Medical Record [id number] Patient Account Number: [id number] Date of Birth/Sex: Treating RN: October 02, 1945 (73 y.o. 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 6 weeks ago she had a fall and suffered a contusion on her left anterior patella area. She saw her primary doctor she has been prescribed Silvadene cream and cephalexin on 8/7 for 1 week. The area is not progressing towards closure and she is here for our review of this. Incidentally we were able to also discover an area on the right anterior lower tibial area which is a small wound that we think may have been there for about 1 week. The patient saw Dr. early on 11/23/2018. He did not think the patient had any arterial issues based on  triphasic waveforms with his hand-held Dopplers. She had venous reflux studies that showed abnormal reflux times in the common femoral vein, great saphenous vein in the distal thigh great saphenous vein at the knee and great saphenous vein at the mid calf similar findings on the left but he did not think that she was a candidate for ablation stating that she had mild reflux in the great saphenous vein proximally but no dilation. There was no DVT Past medical history includes COPD, pulmonary hypertension, coronary disease, hypertension, chronic lower extremity edema ABIs in our clinic were 1.02 on the right and 0.86 on the left 8/28; patient readmitted to the clinic last week. Traumatic wound on the left anterior patella in the setting of fairly significant chronic venous changes. She also has a small area on the right anterior mid tibia area. 9/4; traumatic wound on the left anterior patella. We have been using Medihoney as the patient could not afford Santyl. This is really not doing improving things. We moved to Iodoflex today. Also unfortunately the area on the right anterior mid tibia is not healed 9/24; traumatic wound on the left anterior patella had a greenish drainage on the dressing. This is led to a culture being done of this area. I changed to Iodoflex last time this may have something to do with the color. The area on the right anterior tibia is actually larger. 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 This looks somewhat better. He has an area just below this on the left which is superficial. Finally she has the area on the right anterior pretibial area which looks better and more superficial. Extremely damaged skin on the bilateral lower extremities I think which is mostly secondary to longstanding lymphedema. She does not  want the right leg wrapped largely because she is developing fluid above the wraps just below her knee and in the thigh. I am concerned about not wrapping it however she has made her wishes clear Objective Constitutional Patient is hypertensive.. Pulse regular and within target range for patient.Marland Kitchen Respirations regular, non-labored and within target range.. Temperature is  normal and within the target range for the patient.Marland Kitchen Appears in no distress. Vitals Time Taken: 1:30 PM, Height: 62 in, Weight: 112 lbs, BMI: 20.5, Temperature: 98.9 F, Pulse: 102 bpm, Respiratory Rate: 22 breaths/min, Blood Pressure: 151/61 mmHg. Eyes Conjunctivae clear. No discharge.no icterus. Respiratory work of breathing is normal. Cardiovascular Pedal pulses palpable. Patient has compression edema just above where the wrap would be and also mostly in her posterior right thigh. Psychiatric appears at normal baseline. General Notes: Wound examoowound on the left patella is still present appears to have a healthier surface. ooSuperficial area on the right anterior pre-tibia also looks better superficial healthy granulation. Integumentary (Hair, Skin) Extremely damaged bilateral lower extremity skin which is likely elephantiasis from chronic venous hypertension with secondary lymphedema. Wound #1 status is Open. Original cause of wound was Trauma. The wound is located on the Left,Anterior Knee. The wound measures 4.2cm length x 1.9cm width x 0.2cm depth; 6.267cm^2 area and 1.253cm^3 volume. There is Fat Layer (Subcutaneous Tissue) Exposed exposed. There is no tunneling or undermining noted. There is a medium amount of serosanguineous drainage noted. The wound margin is well defined and not attached to the wound base. There is medium (34-66%) pink granulation within the wound bed. There is a medium (34-66%) amount of necrotic tissue within the wound bed including Adherent Slough. General Notes: scattered areas of  purple within wound bed Wound #2 status is Open. Original cause of wound was Gradually Appeared. The wound is located on the Right,Anterior Lower Leg. The wound measures 1.2cm length x 1.9cm width x 0.1cm depth; 1.791cm^2 area and 0.179cm^3 volume. There is Fat Layer (Subcutaneous Tissue) Exposed exposed. There is no tunneling or undermining noted. There is a small amount of serosanguineous drainage noted. The wound margin is flat and intact. There is large (67-100%) red granulation within the wound bed. There is no necrotic tissue within the wound bed. Wound #3 status is Open. Original cause of wound was Gradually Appeared. The wound is located on the Left,Medial Knee. The wound measures 4cm length x 3.5cm width x 0.1cm depth; 10.996cm^2 area and 1.1cm^3 volume. There is Fat Layer (Subcutaneous Tissue) Exposed exposed. There is no tunneling or undermining noted. There is a medium amount of serosanguineous drainage noted. The wound margin is distinct with the outline attached to the wound base. There is large (67-100%) pink granulation within the wound bed. There is a small (1- 33%) amount of necrotic tissue within the wound bed including Adherent Slough. Assessment Active Problems ICD-10 Contusion of left knee, subsequent encounter Chronic venous hypertension (idiopathic) with inflammation of bilateral lower extremity Lymphedema, not elsewhere classified Non-pressure chronic ulcer of other part of right foot limited to breakdown of skin Plan Follow-up Appointments: Return Appointment in 1 week. Dressing Change Frequency: Wound #1 Left,Anterior Knee: Change Dressing every other day. Wound #2 Right,Anterior Lower Leg: Change Dressing every other day. Wound #3 Left,Medial Knee: Change Dressing every other day. Skin Barriers/Peri-Wound Care: Moisturizing lotion - to legs Wound Cleansing: Wound #1 Left,Anterior Knee: May shower and wash wound with soap and water. - scrub gently with  clean washcloth or gauze Wound #2 Right,Anterior Lower Leg: May shower and wash wound with soap and water. - scrub gently with clean washcloth or gauze Wound #3 Left,Medial Knee: May shower and wash wound with soap and water. - scrub gently with clean washcloth or gauze Primary Wound Dressing: Wound #1 Left,Anterior Knee: Calcium Alginate with Silver Wound #2 Right,Anterior Lower Leg: Hydrofera Blue Wound #3 Left,Medial Knee: Calcium  Alginate with Silver Secondary Dressing: Wound #1 Left,Anterior Knee: Foam Border Wound #2 Right,Anterior Lower Leg: Dry Gauze Wound #3 Left,Medial Knee: Foam Border Edema Control: Avoid standing for long periods of time Elevate legs to the level of the heart or above for 30 minutes daily and/or when sitting, a frequency of: - throughout the day Exercise regularly Support Garment 20-30 mm/Hg pressure to: - patient to purchase compression stockings from elastic therapy. Apply in the am and remove at night. 1. Continue with silver alginate to the left medial knee 2. Hydrofera Blue to the right anterior pre-tibia 3. At the patient's request she does not want a wrap however this stockings she has may not control her edema and I am concerned however she was fairly insistent. She will have to change the dressing herself Electronic Signature(s) Signed: 03/17/2019 6:13:48 PM By: Linton Ham MD Entered By: Linton Ham on 03/17/2019 15:24:21 -------------------------------------------------------------------------------- SuperBill Details Patient Name: Date of Service: [name], [name] 03/17/2019 Medical Record [id number] Patient Account Number: [id number] Date of Birth/Sex: Treating RN: [birth year] (73 y.o. [name] Primary Care Provider: Christain [place] Other Clinician: Referring Provider: Treating Provider/Extender:Lariyah Shetterly, Glendale Chard, Jama Flavors Weeks in Treatment: 5 Diagnosis Coding ICD-10 Codes Code Description S80.02XD  Contusion of left knee, subsequent encounter I87.323 Chronic venous hypertension (idiopathic) with inflammation of bilateral lower extremity I89.0 Lymphedema, not elsewhere classified L97.511 Non-pressure chronic ulcer of other part of right foot limited to breakdown of skin Facility Procedures The patient participates with Medicare or their insurance follows the Medicare Facility Guidelines: CPT4 Code Description Modifier Quantity [id number] [phone] - WOUND CARE VISIT-LEV 5 EST PT 1 Physician Procedures CPT4 Code Description: [id number] - WC PHYS LEVEL 3 - EST PT ICD-10 Diagnosis Description S80.02XD Contusion of left knee, subsequent encounter I89.0 Lymphedema, not elsewhere classified I87.323 Chronic venous hypertension (idiopathic) with inflammat  extremity Modifier: ion of bilateral lo Quantity: 1 wer Electronic Signature(s) Signed: 03/17/2019 6:13:48 PM By: Linton Ham MD Entered By: Linton Ham on 03/17/2019 15:24:50

## 2019-03-18 ENCOUNTER — Encounter (HOSPITAL_BASED_OUTPATIENT_CLINIC_OR_DEPARTMENT_OTHER): Payer: Medicare Other | Admitting: Internal Medicine

## 2019-03-24 ENCOUNTER — Other Ambulatory Visit: Payer: Self-pay

## 2019-03-24 ENCOUNTER — Encounter (HOSPITAL_BASED_OUTPATIENT_CLINIC_OR_DEPARTMENT_OTHER): Payer: Medicare Other | Admitting: Internal Medicine

## 2019-03-24 DIAGNOSIS — S8002XA Contusion of left knee, initial encounter: Secondary | ICD-10-CM | POA: Diagnosis not present

## 2019-03-24 NOTE — Progress Notes (Signed)
[name], [name] ([id number]) Visit Report for 03/24/2019 Debridement Details Patient Name: Date of Service: [name], [name] 03/24/2019 2:00 PM Medical Record [id number] Patient Account Number: [id number] Date of Birth/Sex: Treating RN: [birth year] (73 y.o. Helene Shoe, Meta.Reding Primary Care Provider: Christain [place] Other Clinician: Referring Provider: Treating Provider/Extender:Season Astacio, Glendale Chard, Letta Moynahan in Treatment: 6 Debridement Performed for Wound #1 Left,Anterior Knee Assessment: Performed By: Physician Ricard Dillon., MD Debridement Type: Debridement Level of Consciousness (Pre- Awake and Alert procedure): Pre-procedure Yes - 15:49 Verification/Time Out Taken: Start Time: 15:50 Pain Control: Lidocaine 4% Topical Solution Total Area Debrided (L x W): 2.5 (cm) x 1.5 (cm) = 3.75 (cm) Tissue and other material Viable, Non-Viable, Slough, Subcutaneous, Skin: Dermis , Fibrin/Exudate, Slough debrided: Level: Skin/Subcutaneous Tissue Debridement Description: Excisional Instrument: Curette Bleeding: Minimum Hemostasis Achieved: Pressure End Time: 15:53 Procedural Pain: 0 Post Procedural Pain: 0 Response to Treatment: Procedure was tolerated well Level of Consciousness Awake and Alert (Post-procedure): Post Debridement Measurements of Total Wound Length: (cm) 2.5 Width: (cm) 1.5 Depth: (cm) 0.1 Volume: (cm) 0.295 Character of Wound/Ulcer Post Improved Debridement: Post Procedure Diagnosis Same as Pre-procedure Electronic Signature(s) Signed: 03/24/2019 5:49:31 PM By: Linton Ham MD Signed: 03/24/2019 6:06:48 PM By: Deon Pilling Entered By: Linton Ham on 03/24/2019 15:57:04 -------------------------------------------------------------------------------- HPI Details Patient Name: Date of Service: Crystal Fray F. 03/24/2019 2:00 PM Medical Record [id number] Patient Account Number: [id number] Date of Birth/Sex: Treating RN: 10-07-1945  (73 y.o. [name] Primary Care Provider: Christain [place] Other Clinician: Referring Provider: Treating Provider/Extender:Mounir Skipper, Glendale Chard, Letta Moynahan in Treatment: 6 History of Present Illness HPI Description: ADMISSION 02/04/2019 Mrs. 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 6 weeks ago she had a fall and suffered a contusion on her left anterior patella area. She saw her primary doctor she has been prescribed Silvadene cream and cephalexin on 8/7 for 1 week. The area is not progressing towards closure and she is here for our review of this. Incidentally we were able to also discover an area on the right anterior lower tibial area which is a small wound that we think may have been there for about 1 week. The patient saw Dr. early on 11/23/2018. He did not think the patient had any arterial issues based on triphasic waveforms with his hand-held Dopplers. She had venous reflux studies that showed abnormal reflux times in the common femoral vein, great saphenous vein in the distal thigh great saphenous vein at the knee and great saphenous vein at the mid calf similar findings on the left but he did not think that she was a candidate for ablation stating that she had mild reflux in the great saphenous vein proximally but no dilation. There was no DVT Past medical history includes COPD, pulmonary hypertension, coronary disease, hypertension, chronic lower extremity edema ABIs in our clinic were 1.02 on the right and 0.86 on the left 8/28; patient readmitted to the clinic last week. Traumatic wound on the left anterior patella in the setting of fairly significant chronic venous changes. She also has a small area on the right anterior mid tibia area. 9/4; traumatic wound on the left anterior patella. We have been using Medihoney as the patient could not afford Santyl. This is really not doing improving  things. We moved to Iodoflex today. Also unfortunately the area on the right anterior mid tibia is not healed 9/24; traumatic wound on the left anterior  patella had a greenish drainage on the dressing. This is led to a culture being done of this area. I changed to Iodoflex last time this may have something to do with the color. The area on the right anterior tibia is actually larger. 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 This looks somewhat better. He has an area just below this on the left which is superficial. Finally she has the area on the right anterior pretibial area which looks better and more superficial. Extremely damaged skin on the bilateral lower extremities I think which is mostly secondary to longstanding lymphedema. She does not want the right leg wrapped largely because she is developing fluid above the wraps just below her knee and in the thigh. I am concerned about not wrapping it however she has made her wishes clear 10/8; traumatic wound on the left anterior patella is smaller. We have been using silver alginate. She has 2 closely opposed wounds in the right anterior mid tibia. Some swelling and erythema in this area but no tenderness we have been using silver alginate here as well. She will not allow compression wraps. She has a compression stocking for the right lower leg Electronic Signature(s) Signed: 03/24/2019 5:49:31 PM By: Linton Ham MD Entered By: Linton Ham on 03/24/2019 15:59:24 -------------------------------------------------------------------------------- Physical Exam Details Patient Name: Date of Service: Crystal Fray F. 03/24/2019 2:00 PM Medical Record [id number] Patient Account Number: [id number] Date of Birth/Sex: Treating RN: 09/24/1945 (73 y.o. [name] Primary Care  Provider: Christain [place] Other Clinician: Referring Provider: Treating Provider/Extender:Angelissa Supan, Glendale Chard, Letta Moynahan in Treatment: 6 Constitutional Patient is hypertensive.. Pulse regular and within target range for patient.Marland Kitchen Respirations regular, non-labored and within target range.. Temperature is normal and within the target range for the patient.Marland Kitchen Appears in no distress. Notes Wound exam On the left patella improved dimensions. Still requiring debridement with a #3 curette removing tightly adherent fibrinous debris The 2 areas are superficial on the right however I am concerned about the swelling around the wounds Electronic Signature(s) Signed: 03/24/2019 5:49:31 PM By: Linton Ham MD Entered By: Linton Ham on 03/24/2019 16:00:11 -------------------------------------------------------------------------------- Physician Orders Details Patient Name: Date of Service: [name] 03/24/2019 2:00 PM Medical Record [id number] Patient Account Number: [id number] Date of Birth/Sex: Treating RN: 1946-04-09 (73 y.o. [name] Primary Care Provider: Christain [place] Other Clinician: Referring Provider: Treating Provider/Extender:Rainie Crenshaw, Glendale Chard, Letta Moynahan in Treatment: 6 Verbal / Phone Orders: No Diagnosis Coding ICD-10 Coding Code Description S80.02XD Contusion of left knee, subsequent encounter I87.323 Chronic venous hypertension (idiopathic) with inflammation of bilateral lower extremity I89.0 Lymphedema, not elsewhere classified L97.511 Non-pressure chronic ulcer of other part of right foot limited to breakdown of skin Follow-up Appointments Return Appointment in 2 weeks. Dressing Change Frequency Wound #1 Left,Anterior Knee Change Dressing every other day. Wound #2 Right,Anterior Lower Leg Change Dressing every other day. Skin Barriers/Peri-Wound Care Moisturizing lotion - to legs Wound Cleansing Wound #1 Left,Anterior Knee May  shower and wash wound with soap and water. - scrub gently with clean washcloth or gauze Wound #2 Right,Anterior Lower Leg May shower and wash wound with soap and water. - scrub gently with clean washcloth or gauze Primary Wound Dressing Wound #1 Left,Anterior Knee Calcium Alginate with Silver Wound #2 Right,Anterior Lower Leg Calcium Alginate with Silver Secondary Dressing Wound #1 Left,Anterior Knee Foam Border Wound #2 Right,Anterior  Lower Leg Dry Gauze Edema Control Avoid standing for long periods of time Elevate legs to the level of the heart or above for 30 minutes daily and/or when sitting, a frequency of: - throughout the day Exercise regularly Support Garment 30-40 mm/Hg pressure to: - order patient juxtalite HD for right leg. In Clinic today apply patient's current compression stocking to right leg. Apply juxatlite to left leg today in clinic. Patient to apply juxtalites to both legs. Apply in the am and remove night. Electronic Signature(s) Signed: 03/24/2019 5:49:31 PM By: Linton Ham MD Signed: 03/24/2019 6:06:48 PM By: Deon Pilling Entered By: Deon Pilling on 03/24/2019 15:57:18 -------------------------------------------------------------------------------- Problem List Details Patient Name: Date of Service: [name] 03/24/2019 2:00 PM Medical Record [id number] Patient Account Number: [id number] Date of Birth/Sex: Treating RN: [birth year] (73 y.o. [name] Primary Care Provider: Christain [place] Other Clinician: Referring Provider: Treating Provider/Extender:Layni Kreamer, Glendale Chard, Letta Moynahan in Treatment: 6 Active Problems ICD-10 Evaluated Encounter Code Description Active Date Today Diagnosis S80.02XD Contusion of left knee, subsequent encounter 02/04/2019 No Yes L97.811 Non-pressure chronic ulcer of other part of right lower 03/24/2019 No Yes leg limited to breakdown of skin I87.323 Chronic venous hypertension (idiopathic) with  02/04/2019 No Yes inflammation of bilateral lower extremity I89.0 Lymphedema, not elsewhere classified 02/04/2019 No Yes Inactive Problems ICD-10 Code Description Active Date Inactive Date L97.511 Non-pressure chronic ulcer of other part of right foot limited to 02/11/2019 02/11/2019 breakdown of skin Resolved Problems Electronic Signature(s) Signed: 03/24/2019 5:49:31 PM By: Linton Ham MD Entered By: Linton Ham on 03/24/2019 15:56:13 -------------------------------------------------------------------------------- Progress Note Details Patient Name: Date of Service: [name] 03/24/2019 2:00 PM Medical Record [id number] Patient Account Number: [id number] Date of Birth/Sex: Treating RN: [birth year] (73 y.o. [name] Primary Care Provider: Christain [place] Other Clinician: Referring Provider: Treating Provider/Extender:Lenardo Westwood, Glendale Chard, Jama Flavors Weeks in Treatment: 6 Subjective History of Present Illness (HPI) ADMISSION 02/04/2019 Mrs. 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 6 weeks ago she had a fall and suffered a contusion on her left anterior patella area. She saw her primary doctor she has been prescribed Silvadene cream and cephalexin on 8/7 for 1 week. The area is not progressing towards closure and she is here for our review of this. Incidentally we were able to also discover an area on the right anterior lower tibial area which is a small wound that we think may have been there for about 1 week. The patient saw Dr. early on 11/23/2018. He did not think the patient had any arterial issues based on triphasic waveforms with his hand-held Dopplers. She had venous reflux studies that showed abnormal reflux times in the common femoral vein, great saphenous vein in the distal thigh great saphenous vein at the knee and great saphenous vein at the mid calf similar findings on the  left but he did not think that she was a candidate for ablation stating that she had mild reflux in the great saphenous vein proximally but no dilation. There was no DVT Past medical history includes COPD, pulmonary hypertension, coronary disease, hypertension, chronic lower extremity edema ABIs in our clinic were 1.02 on the right and 0.86 on the left 8/28; patient readmitted to the clinic last week. Traumatic wound on the left anterior patella in the setting of fairly significant chronic venous changes. She also has a small area on the right anterior mid tibia area. 9/4;  traumatic wound on the left anterior patella. We have been using Medihoney as the patient could not afford Santyl. This is really not doing improving things. We moved to Iodoflex today. Also unfortunately the area on the right anterior mid tibia is not healed 9/24; traumatic wound on the left anterior patella had a greenish drainage on the dressing. This is led to a culture being done of this area. I changed to Iodoflex last time this may have something to do with the color. The area on the right anterior tibia is actually larger. 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 This looks somewhat better. He has an area just below this on the left which is superficial. Finally she has the area on the right anterior pretibial area which looks better and more superficial. Extremely damaged skin on the bilateral lower extremities I think which is mostly secondary to longstanding lymphedema. She does not want the right leg wrapped largely because she is developing fluid above the wraps just below her knee and in the thigh. I am concerned about not wrapping it however she has made her wishes clear 10/8; traumatic wound on the left anterior patella is smaller. We have been using  silver alginate. She has 2 closely opposed wounds in the right anterior mid tibia. Some swelling and erythema in this area but no tenderness we have been using silver alginate here as well. She will not allow compression wraps. She has a compression stocking for the right lower leg Objective Constitutional Patient is hypertensive.. Pulse regular and within target range for patient.Marland Kitchen Respirations regular, non-labored and within target range.. Temperature is normal and within the target range for the patient.Marland Kitchen Appears in no distress. Vitals Time Taken: 3:02 PM, Height: 62 in, Weight: 112 lbs, BMI: 20.5, Temperature: 98.4 F, Pulse: 94 bpm, Respiratory Rate: 22 breaths/min, Blood Pressure: 148/59 mmHg. General Notes: Wound exam ooOn the left patella improved dimensions. Still requiring debridement with a #3 curette removing tightly adherent fibrinous debris ooThe 2 areas are superficial on the right however I am concerned about the swelling around the wounds Integumentary (Hair, Skin) Wound #1 status is Open. Original cause of wound was Trauma. The wound is located on the Left,Anterior Knee. The wound measures 2.5cm length x 1.5cm width x 0.1cm depth; 2.945cm^2 area and 0.295cm^3 volume. There is Fat Layer (Subcutaneous Tissue) Exposed exposed. There is no tunneling or undermining noted. There is a medium amount of serosanguineous drainage noted. The wound margin is well defined and not attached to the wound base. There is large (67-100%) pink granulation within the wound bed. There is a small (1-33%) amount of necrotic tissue within the wound bed including Adherent Slough. Wound #2 status is Open. Original cause of wound was Gradually Appeared. The wound is located on the Right,Anterior Lower Leg. The wound measures 1cm length x 0.8cm width x 0.1cm depth; 0.628cm^2 area and 0.063cm^3 volume. There is Fat Layer (Subcutaneous Tissue) Exposed exposed. There is no tunneling or undermining noted.  There is a medium amount of serosanguineous drainage noted. The wound margin is flat and intact. There is large (67-100%) red granulation within the wound bed. There is no necrotic tissue within the wound bed. Wound #3 status is Healed - Epithelialized. Original cause of wound was Gradually Appeared. The wound is located on the Left,Medial Knee. The wound measures 0cm length x 0cm width x 0cm depth; 0cm^2  area and 0cm^3 volume. There is no tunneling or undermining noted. There is a none present amount of drainage noted. The wound margin is distinct with the outline attached to the wound base. There is no granulation within the wound bed. There is no necrotic tissue within the wound bed. Assessment Active Problems ICD-10 Contusion of left knee, subsequent encounter Non-pressure chronic ulcer of other part of right lower leg limited to breakdown of skin Chronic venous hypertension (idiopathic) with inflammation of bilateral lower extremity Lymphedema, not elsewhere classified Procedures Wound #1 Pre-procedure diagnosis of Wound #1 is an Abrasion located on the Left,Anterior Knee . There was a Excisional Skin/Subcutaneous Tissue Debridement with a total area of 3.75 sq cm performed by Ricard Dillon., MD. With the following instrument(s): Curette to remove Viable and Non-Viable tissue/material. Material removed includes Subcutaneous Tissue, Slough, Skin: Dermis, and Fibrin/Exudate after achieving pain control using Lidocaine 4% Topical Solution. A time out was conducted at 15:49, prior to the start of the procedure. A Minimum amount of bleeding was controlled with Pressure. The procedure was tolerated well with a pain level of 0 throughout and a pain level of 0 following the procedure. Post Debridement Measurements: 2.5cm length x 1.5cm width x 0.1cm depth; 0.295cm^3 volume. Character of Wound/Ulcer Post Debridement is improved. Post procedure Diagnosis Wound #1: Same as  Pre-Procedure Plan Follow-up Appointments: Return Appointment in 2 weeks. Dressing Change Frequency: Wound #1 Left,Anterior Knee: Change Dressing every other day. Wound #2 Right,Anterior Lower Leg: Change Dressing every other day. Skin Barriers/Peri-Wound Care: Moisturizing lotion - to legs Wound Cleansing: Wound #1 Left,Anterior Knee: May shower and wash wound with soap and water. - scrub gently with clean washcloth or gauze Wound #2 Right,Anterior Lower Leg: May shower and wash wound with soap and water. - scrub gently with clean washcloth or gauze Primary Wound Dressing: Wound #1 Left,Anterior Knee: Calcium Alginate with Silver Wound #2 Right,Anterior Lower Leg: Calcium Alginate with Silver Secondary Dressing: Wound #1 Left,Anterior Knee: Foam Border Wound #2 Right,Anterior Lower Leg: Dry Gauze Edema Control: Avoid standing for long periods of time Elevate legs to the level of the heart or above for 30 minutes daily and/or when sitting, a frequency of: - throughout the day Exercise regularly Support Garment 30-40 mm/Hg pressure to: - order patient juxtalite HD for right leg. In Clinic today apply patient's current compression stocking to right leg. Apply juxatlite to left leg today in clinic. Patient to apply juxtalites to both legs. Apply in the am and remove night. 1. Continue with silver alginate to both wound areas 2. Her own compression stocking on the right leg should help with the periwound edema. She has significant stasis dermatitis in this area as well Electronic Signature(s) Signed: 03/24/2019 5:49:31 PM By: Linton Ham MD Entered By: Linton Ham on 03/24/2019 16:01:01 -------------------------------------------------------------------------------- SuperBill Details Patient Name: Date of Service: Glauber, Nykayla F. 03/24/2019 Medical Record [id number] Patient Account Number: [id number] Date of Birth/Sex: Treating RN: 06-29-1945 (73 y.o. [name] Primary Care Provider: Christain [place] Other Clinician: Referring Provider: Treating Provider/Extender:Calan Doren, Glendale Chard, Jama Flavors Weeks in Treatment: 6 Diagnosis Coding ICD-10 Codes Code Description S80.02XD Contusion of left knee, subsequent encounter L97.811 Non-pressure chronic ulcer of other part of right lower leg limited to breakdown of skin I87.323 Chronic venous hypertension (idiopathic) with inflammation of bilateral lower extremity I89.0 Lymphedema, not elsewhere classified Facility Procedures The patient participates with Medicare or their insurance follows the Medicare Facility Guidelines: CPT4 Code Description Modifier Quantity [id number] 11042 -  DEB SUBQ TISSUE 20 SQ CM/< 1 ICD-10 Diagnosis Description L97.811 Non-pressure chronic ulcer of  other part of right lower leg limited to breakdown of skin Physician Procedures CPT4 Code Description: [id number] - WC PHYS SUBQ TISS 20 SQ CM ICD-10 Diagnosis Description L97.811 Non-pressure chronic ulcer of other part of right lower leg skin Modifier: limited to breakdow Quantity: 1 n of Electronic Signature(s) Signed: 03/24/2019 5:49:31 PM By: Linton Ham MD Entered By: Linton Ham on 03/24/2019 16:01:24

## 2019-03-25 NOTE — Progress Notes (Signed)
[name], [name] ([id number]) Visit Report for 03/17/2019 Arrival Information Details Patient Name: Date of Service: [name], [name] 03/17/2019 1:15 PM Medical Record [id number] Patient Account Number: [id number] Date of Birth/Sex: Treating RN: 05-Mar-1946 (73 y.o. [name] Primary Care Gianluca Chhim: Christain [place] Other Clinician: Referring Tedi Hughson: Treating Raylon Lamson/Extender:Robson, Glendale Chard, Letta Moynahan in Treatment: 5 Visit Information History Since Last Visit Wheel Chair Added or deleted any medications: No Patient Arrived: Any new allergies or adverse reactions: No Arrival Time: 13:37 Had a fall or experienced change in No Accompanied By: family activities of daily living that may affect member risk of falls: Transfer Assistance: None Signs or symptoms of abuse/neglect since last No Patient Identification Verified: Yes visito Secondary Verification Process Yes Hospitalized since last visit: No Completed: Implantable device outside of the clinic excluding No Patient Requires Transmission-Based No cellular tissue based products placed in the center Precautions: since last visit: Patient Has Alerts: No Has Dressing in Place as Prescribed: Yes Has Compression in Place as Prescribed: Yes Pain Present Now: No Electronic Signature(s) Signed: 03/25/2019 5:13:27 PM By: Kela Millin Entered By: Kela Millin on 03/17/2019 13:38:22 -------------------------------------------------------------------------------- Clinic Level of Care Assessment Details Patient Name: Date of Service: [name], [name] 03/17/2019 1:15 PM Medical Record [id number] Patient Account Number: [id number] Date of Birth/Sex: Treating RN: 18-Jul-1945 (73 y.o. Helene Shoe, Meta.Reding Primary Care Efrem Pitstick: Christain [place] Other Clinician: Referring Adell Panek: Treating Itzia Cunliffe/Extender:Robson, Glendale Chard, Letta Moynahan in Treatment: 5 Clinic Level of Care Assessment  Items TOOL 4 Quantity Score X - Use when only an EandM is performed on FOLLOW-UP visit 1 0 ASSESSMENTS - Nursing Assessment / Reassessment X - Reassessment of Co-morbidities (includes updates in patient status) 1 10 X - Reassessment of Adherence to Treatment Plan 1 5 ASSESSMENTS - Wound and Skin Assessment / Reassessment X - Simple Wound Assessment / Reassessment - one wound 1 5 X - Complex Wound Assessment / Reassessment - multiple wounds 1 5 X - Dermatologic / Skin Assessment (not related to wound area) 1 10 ASSESSMENTS - Focused Assessment X - Circumferential Edema Measurements - multi extremities 2 5 X - Nutritional Assessment / Counseling / Intervention 1 10 _0  - Lower Extremity Assessment (monofilament, tuning fork, pulses) 0 _1  - Peripheral Arterial Disease Assessment (using hand held doppler) 0 ASSESSMENTS - Ostomy and/or Continence Assessment and Care _2  - Incontinence Assessment and Management 0 _3  - Ostomy Care Assessment and Management (repouching, etc.) 0 PROCESS - Coordination of Care _4  - Simple Patient / Family Education for ongoing care 0 X - Complex (extensive) Patient / Family Education for ongoing care 1 20 X - Staff obtains Programmer, systems, Records, Test Results / Process Orders 1 10 _5  - Staff telephones HHA, Nursing Homes / Clarify orders / etc 0 _6  - Routine Transfer to another Facility (non-emergent condition) 0 _7  - Routine Hospital Admission (non-emergent condition) 0 _8  - New Admissions / Biomedical engineer / Ordering NPWT, Apligraf, etc. 0 _9  - Emergency Hospital Admission (emergent condition) 0 _10  - Simple Discharge Coordination 0 X - Complex (extensive) Discharge Coordination 1 15 PROCESS - Special Needs _11  - Pediatric / Minor Patient Management 0 _12  - Isolation Patient Management 0 _13  - Hearing / Language / Visual special needs 0 _14  - Assessment of Community assistance (transportation, D/C planning, etc.) 0 _15  - Additional assistance / Altered  mentation 0 _16  - Support Surface(s) Assessment (bed, cushion, seat, etc.) 0 INTERVENTIONS - Wound Cleansing / Measurement _17  - Simple Wound Cleansing - one wound  0 X - Complex Wound Cleansing - multiple wounds 3 5 X - Wound Imaging (photographs - any number of wounds) 1 5 _0  - Wound Tracing (instead of photographs) 0 _1  - Simple Wound Measurement - one wound 0 X - Complex Wound Measurement - multiple wounds 3 5 INTERVENTIONS - Wound Dressings X - Small Wound Dressing one or multiple wounds 2 10 X - Medium Wound Dressing one or multiple wounds 3 15 _2  - Large Wound Dressing one or multiple wounds 0 <LFYBOFBPZWCHENID>_7<\/OEUMPNTIRWERXVQM>_0  - Application of Medications - topical 0 <QQPYPPJKDTOIZTIW>_5<\/YKDXIPJASNKNLZJQ>_7  - Application of Medications - injection 0 INTERVENTIONS - Miscellaneous _5  - External ear exam 0 _6  - Specimen Collection (cultures, biopsies, blood, body fluids, etc.) 0 _7  - Specimen(s) / Culture(s) sent or taken to Lab for analysis 0 _8  - Patient Transfer (multiple staff / Civil Service fast streamer / Similar devices) 0 _9  - Simple Staple / Suture removal (25 or less) 0 _10  - Complex Staple / Suture removal (26 or more) 0 _11  - Hypo / Hyperglycemic Management (close monitor of Blood Glucose) 0 _12  - Ankle / Brachial Index (ABI) - do not check if billed separately 0 X - Vital Signs 1 5 Has the patient been seen at the hospital within the last three years: Yes Total Score: 205 Level Of Care: New/Established - Level 5 Electronic Signature(s) Signed: 03/17/2019 6:31:45 PM By: Deon Pilling Entered By: Deon Pilling on 03/17/2019 14:40:29 -------------------------------------------------------------------------------- Encounter Discharge Information Details Patient Name: Date of Service: Crystal Barth. 03/17/2019 1:15 PM Medical Record [id number] Patient Account Number: [id number] Date of Birth/Sex: Treating RN: Jul 11, 1945 (73 y.o. Elam Dutch Primary Care Taveon Enyeart: Christain [place] Other Clinician: Referring Keyonda Bickle: Treating  Destyn Parfitt/Extender:Robson, Glendale Chard, Letta Moynahan in Treatment: 5 Encounter Discharge Information Items Discharge Condition: Stable Ambulatory Status: Wheelchair Discharge Destination: Home Transportation: Private Auto Accompanied By: friend Schedule Follow-up Appointment: Yes Clinical Summary of Care: Patient Declined Electronic Signature(s) Signed: 03/18/2019 7:05:19 PM By: Baruch Gouty RN, BSN Entered By: Baruch Gouty on 03/17/2019 14:53:20 -------------------------------------------------------------------------------- Lower Extremity Assessment Details Patient Name: Date of Service: NADYNE, GARIEPY. 03/17/2019 1:15 PM Medical Record [id number] Patient Account Number: [id number] Date of Birth/Sex: Treating RN: 1946-02-08 (73 y.o. [name] Primary Care Xanthe Couillard: Christain [place] Other Clinician: Referring Kymberly Blomberg: Treating Arriana Lohmann/Extender:Robson, Glendale Chard, Jama Flavors Weeks in Treatment: 5 Edema Assessment Assessed: [Left: Yes] [Right: Yes] Edema: [Left: Yes] [Right: Yes] Calf Left: Right: Point of Measurement: 29 cm From Medial Instep 33 cm 30 cm Ankle Left: Right: Point of Measurement: 9 cm From Medial Instep 21.5 cm 21 cm Vascular Assessment Pulses: Dorsalis Pedis Palpable: [Left:Yes] [Right:Yes] Electronic Signature(s) Signed: 03/17/2019 6:31:45 PM By: Deon Pilling Entered By: Deon Pilling on 03/17/2019 14:22:21 -------------------------------------------------------------------------------- Multi Wound Chart Details Patient Name: Date of Service: Crystal Barth. 03/17/2019 1:15 PM Medical Record [id number] Patient Account Number: [id number] Date of Birth/Sex: Treating RN: [birth year] (73 y.o. [name] Primary Care Donyetta Ogletree: Christain [place] Other Clinician: Referring Jeramy Dimmick: Treating Jabriel Vanduyne/Extender:Robson, Glendale Chard, Jama Flavors Weeks in Treatment: 5 Vital Signs Height(in): 32 Pulse(bpm): 102 Weight(lbs):  112 Blood Pressure(mmHg): 151/61 Body Mass Index(BMI): 20 Temperature(F): 98.9 Respiratory 22 Rate(breaths/min): Photos: [1:No Photos] [2:No Photos] [3:No Photos] Wound Location: [1:Left Knee - Anterior] [2:Right Lower Leg - Anterior Left Knee - Medial] Wounding Event: [1:Trauma] [2:Gradually Appeared] [3:Gradually Appeared] Primary Etiology: [1:Abrasion] [2:Venous Leg Ulcer] [3:Abrasion] Comorbid History: [1:Cataracts, Chronic Obstructive Pulmonary Disease (COPD), Hypertension, Peripheral Hypertension, Peripheral Hypertension, Peripheral Venous Disease] [2:Cataracts, Chronic Obstructive Pulmonary Disease (COPD), Venous Disease]  [  3:Cataracts, Chronic Obstructive Pulmonary Disease (COPD), Venous Disease] Date Acquired: [1:01/10/2019] [2:01/28/2019] [3:03/10/2019] Weeks of Treatment: [1:5] [2:5] [3:1] Wound Status: [1:Open] [2:Open] [3:Open] Measurements L x W x D 4.2x1.9x0.2 [2:1.2x1.9x0.1] [3:4x3.5x0.1] (cm) Area (cm) : [1:6.267] [2:1.791] [3:10.996] Volume (cm) : [1:1.253] [2:0.179] [3:1.1] % Reduction in Area: [1:52.50%] [2:-280.30%] [3:-366.70%] % Reduction in Volume: 68.30% [2:-280.90%] [3:-366.10%] Classification: [1:Full Thickness Without Exposed Support Structures Exposed Support Structures Exposed Support Structures] [2:Full Thickness Without] [3:Full Thickness Without] Exudate Amount: [1:Medium] [2:Small] [3:Medium] Exudate Type: [1:Serosanguineous] [2:Serosanguineous] [3:Serosanguineous] Exudate Color: [1:red, brown] [2:red, brown] [3:red, brown] Wound Margin: [1:Well defined, not attached Flat and Intact] [3:Distinct, outline attached] Granulation Amount: [1:Medium (34-66%)] [2:Large (67-100%)] [3:Large (67-100%)] Granulation Quality: [1:Pink] [2:Red] [3:Pink] Necrotic Amount: [1:Medium (34-66%)] [2:None Present (0%)] [3:Small (1-33%)] Exposed Structures: [1:Fat Layer (Subcutaneous Tissue) Exposed: Yes Fascia: No Tendon: No Muscle: No Joint: No Bone: No] [2:Fat Layer  (Subcutaneous Tissue) Exposed: Yes Fascia: No Tendon: No Muscle: No Joint: No Bone: No] [3:Fat Layer (Subcutaneous Tissue) Exposed: Yes  Fascia: No Tendon: No Muscle: No Joint: No Bone: No] Epithelialization: [1:None] [2:None] [3:None] Assessment Notes: [1:scattered areas of purple within wound bed] [2:N/A] [3:N/A] Treatment Notes Wound #1 (Left, Anterior Knee) 3. Primary Dressing Applied Calcium Alginate Ag 4. Secondary Dressing Dry Gauze Roll Gauze Notes netting Wound #2 (Right, Anterior Lower Leg) 2. Periwound Care Skin Prep 3. Primary Dressing Applied Hydrofera Blue 4. Secondary Dressing Foam Border Dressing Notes netting. Wound #3 (Left, Medial Knee) 3. Primary Dressing Applied Calcium Alginate Ag 4. Secondary Dressing Dry Gauze Roll Gauze Notes netting Electronic Signature(s) Signed: 03/17/2019 6:13:48 PM By: Linton Ham MD Signed: 03/17/2019 6:31:45 PM By: Deon Pilling Entered By: Linton Ham on 03/17/2019 15:15:07 -------------------------------------------------------------------------------- Multi-Disciplinary Care Plan Details Patient Name: Date of Service: [name], [name] 03/17/2019 1:15 PM Medical Record [id number] Patient Account Number: [id number] Date of Birth/Sex: Treating RN: 11-29-1945 (73 y.o. Helene Shoe, Tammi Klippel Primary Care Tanina Barb: Christain [place] Other Clinician: Referring Raiyan Dalesandro: Treating Reannah Totten/Extender:Robson, Glendale Chard, Letta Moynahan in Treatment: 5 Active Inactive Abuse / Safety / Falls / Self Care Management Nursing Diagnoses: History of Falls Potential for falls Goals: Patient will remain injury free related to falls Date Initiated: 02/04/2019 Target Resolution Date: 04/15/2019 Goal Status: Active Patient/caregiver will verbalize/demonstrate measures taken to prevent injury and/or falls Date Initiated: 02/04/2019 Date Inactivated: 03/10/2019 Target Resolution Date: 03/11/2019 Goal Status:  Met Interventions: Assess fall risk on admission and as needed Assess: immobility, friction, shearing, incontinence upon admission and as needed Assess personal safety and home safety (as indicated) on admission and as needed Notes: Venous Leg Ulcer Nursing Diagnoses: Actual venous Insuffiency (use after diagnosis is confirmed) Knowledge deficit related to disease process and management Goals: Patient will maintain optimal edema control Date Initiated: 02/04/2019 Target Resolution Date: 04/15/2019 Goal Status: Active Patient/caregiver will verbalize understanding of disease process and disease management Date Initiated: 02/04/2019 Date Inactivated: 03/10/2019 Target Resolution Date: 03/11/2019 Goal Status: Met Interventions: Assess peripheral edema status every visit. Compression as ordered Provide education on venous insufficiency Treatment Activities: Therapeutic compression applied : 02/04/2019 Notes: Wound/Skin Impairment Nursing Diagnoses: Impaired tissue integrity Knowledge deficit related to ulceration/compromised skin integrity Goals: Patient/caregiver will verbalize understanding of skin care regimen Date Initiated: 02/04/2019 Target Resolution Date: 04/15/2019 Goal Status: Active Ulcer/skin breakdown will have a volume reduction of 30% by week 4 Date Initiated: 02/04/2019 Target Resolution Date: 03/11/2019 Goal Status: Active Interventions: Assess patient/caregiver ability to obtain necessary supplies Assess patient/caregiver ability to perform ulcer/skin care regimen upon admission and as needed  Assess ulceration(s) every visit Provide education on ulcer and skin care Treatment Activities: Skin care regimen initiated : 02/04/2019 Topical wound management initiated : 02/04/2019 Notes: Electronic Signature(s) Signed: 03/17/2019 6:31:45 PM By: Deon Pilling Entered By: Deon Pilling on 03/17/2019  14:12:16 -------------------------------------------------------------------------------- Pain Assessment Details Patient Name: Date of Service: [name], [name] 03/17/2019 1:15 PM Medical Record [id number] Patient Account Number: [id number] Date of Birth/Sex: Treating RN: [birth year] (73 y.o. [name] Primary Care Loda Bialas: Christain [place] Other Clinician: Referring Jo-Ann Johanning: Treating Grainne Knights/Extender:Robson, Glendale Chard, Letta Moynahan in Treatment: 5 Active Problems Location of Pain Severity and Description of Pain Patient Has Paino No Site Locations Pain Management and Medication Current Pain Management: Electronic Signature(s) Signed: 03/25/2019 5:13:27 PM By: Kela Millin Entered By: Kela Millin on 03/17/2019 13:39:05 -------------------------------------------------------------------------------- Patient/Caregiver Education Details Patient Name: Date of Service: Crystal Barth 10/1/2020andnbsp1:15 PM Medical Record [id number] Patient Account Number: [id number] Date of Birth/Gender: [birth year] (73 y.o. F) Treating RN: Deon Pilling Primary Care Physician: Christain [place] Other Clinician: Referring Physician: Treating Physician/Extender:Robson, Glendale Chard, Letta Moynahan in Treatment: 5 Education Assessment Education Provided To: Patient Education Topics Provided Wound/Skin Impairment: Handouts: Skin Care Do's and Dont's Methods: Explain/Verbal Responses: Reinforcements needed Electronic Signature(s) Signed: 03/17/2019 6:31:45 PM By: Deon Pilling Entered By: Deon Pilling on 03/17/2019 14:12:33 -------------------------------------------------------------------------------- Wound Assessment Details Patient Name: Date of Service: [name], [name] 03/17/2019 1:15 PM Medical Record [id number] Patient Account Number: [id number] Date of Birth/Sex: Treating RN: [birth year] (73 y.o. [name] Primary Care Callen Zuba:  Christain [place] Other Clinician: Referring Koby Hartfield: Treating Alexandrya Chim/Extender:Robson, Glendale Chard, Jama Flavors Weeks in Treatment: 5 Wound Status Wound Number: 1 Primary Abrasion Etiology: Wound Location: Left Knee - Anterior Wound Open Wounding Event: Trauma Status: Date Acquired: 01/10/2019 Comorbid Cataracts, Chronic Obstructive Pulmonary Weeks Of Treatment: 5 History: Disease (COPD), Hypertension, Peripheral Clustered Wound: No Venous Disease Photos Wound Measurements Length: (cm) 4.2 % Reduct Width: (cm) 1.9 % Reduct Depth: (cm) 0.2 Epitheli Area: (cm) 6.267 Tunneli Volume: (cm) 1.253 Undermi Wound Description Full Thickness Without Exposed Support Foul Od Classification: Structures Slough/ Wound Well defined, not attached Margin: Exudate Medium Amount: Exudate Serosanguineous Type: Exudate red, brown Color: Wound Bed Granulation Amount: Medium (34-66%) Granulation Quality: Pink Fascia E Necrotic Amount: Medium (34-66%) Fat Laye Necrotic Quality: Adherent Slough Tendon E Muscle E Joint Ex Bone Exp or After Cleansing: No Fibrino Yes Exposed Structure xposed: No r (Subcutaneous Tissue) Exposed: Yes xposed: No xposed: No posed: No osed: No ion in Area: 52.5% ion in Volume: 68.3% alization: None ng: No ning: No Assessment Notes scattered areas of purple within wound bed Electronic Signature(s) Signed: 03/18/2019 10:20:10 AM By: Mikeal Hawthorne EMT/HBOT Signed: 03/25/2019 5:13:27 PM By: Kela Millin Entered By: Mikeal Hawthorne on 03/18/2019 08:37:07 -------------------------------------------------------------------------------- Wound Assessment Details Patient Name: Date of Service: [name], [name] 03/17/2019 1:15 PM Medical Record [id number] Patient Account Number: [id number] Date of Birth/Sex: Treating RN: 1946-06-01 (73 y.o. [name] Primary Care Nava [name]: Christain [place] Other Clinician: Referring Abigayl Hor: Treating  Abdimalik Mayorquin/Extender:Robson, Glendale Chard, Jama Flavors Weeks in Treatment: 5 Wound Status Wound Number: 2 Primary Venous Leg Ulcer Etiology: Wound Location: Right Lower Leg - Anterior Wound Open Wounding Event: Gradually Appeared Status: Date Acquired: 01/28/2019 Comorbid Cataracts, Chronic Obstructive Pulmonary Weeks Of Treatment: 5 History: Disease (COPD), Hypertension, Peripheral Clustered Wound: No Venous Disease Photos Wound Measurements Length: (cm) 1.2 % Reduct Width: (cm) 1.9 % Reduct Depth: (cm) 0.1 Epitheli Area: (cm) 1.791 Tunneli Volume: (cm) 0.179 Undermi Wound Description  Classification: Full Thickness Without Exposed Support Foul Od Structures Slough/ Wound Flat and Intact Margin: Exudate Small Amount: Exudate Serosanguineous Type: Exudate red, brown Color: Wound Bed Granulation Amount: Large (67-100%) Granulation Quality: Red Fascia Expos Necrotic Amount: None Present (0%) Fat Layer (S Tendon Expos Muscle Expos Joint Expose Bone Exposed or After Cleansing: No Fibrino No Exposed Structure ed: No ubcutaneous Tissue) Exposed: Yes ed: No ed: No d: No : No ion in Area: -280.3% ion in Volume: -280.9% alization: None ng: No ning: No Electronic Signature(s) Signed: 03/18/2019 10:20:10 AM By: Mikeal Hawthorne EMT/HBOT Signed: 03/25/2019 5:13:27 PM By: Kela Millin Entered By: Mikeal Hawthorne on 03/18/2019 08:36:27 -------------------------------------------------------------------------------- Wound Assessment Details Patient Name: Date of Service: [name], [name] 03/17/2019 1:15 PM Medical Record [id number] Patient Account Number: [id number] Date of Birth/Sex: Treating RN: 01-25-1946 (73 y.o. [name] Primary Care Leasia Swann: Christain [place] Other Clinician: Referring Norvel Wenker: Treating Gayland Nicol/Extender:Robson, Glendale Chard, Jama Flavors Weeks in Treatment: 5 Wound Status Wound Number: 3 Primary Abrasion Etiology: Wound  Location: Left Knee - Medial Wound Open Wounding Event: Gradually Appeared Status: Date Acquired: 03/10/2019 Comorbid Cataracts, Chronic Obstructive Pulmonary Weeks Of Treatment: 1 History: Disease (COPD), Hypertension, Peripheral Clustered Wound: No Venous Disease Photos Wound Measurements Length: (cm) 4 % Reduction Width: (cm) 3.5 % Reduction Depth: (cm) 0.1 Epithelializ Area: (cm) 10.996 Tunneli Volume: (cm) 1.1 Undermi Wound Description Classification: Full Thickness Without Exposed Support Foul Od Structures Slough/ Wound Distinct, outline attached Margin: Exudate Medium Amount: Exudate Serosanguineous Type: Exudate red, brown Color: Wound Bed Granulation Amount: Large (67-100%) Granulation Quality: Pink Fascia E Necrotic Amount: Small (1-33%) Fat Laye Necrotic Quality: Adherent Slough Tendon E Muscle E Joint Ex Bone Exp or After Cleansing: No Fibrino Yes Exposed Structure xposed: No r (Subcutaneous Tissue) Exposed: Yes xposed: No xposed: No posed: No osed: No in Area: -366.7% in Volume: -366.1% ation: None ng: No ning: No Electronic Signature(s) Signed: 03/18/2019 10:20:10 AM By: Mikeal Hawthorne EMT/HBOT Signed: 03/25/2019 5:13:27 PM By: Kela Millin Entered By: Mikeal Hawthorne on 03/18/2019 08:36:45 -------------------------------------------------------------------------------- Vitals Details Patient Name: Date of Service: Crystal Barth. 03/17/2019 1:15 PM Medical Record [id number] Patient Account Number: [id number] Date of Birth/Sex: Treating RN: [birth year] (73 y.o. [name] Primary Care Yoceline Bazar: Christain [place] Other Clinician: Referring Edith Lord: Treating Jung Ingerson/Extender:Robson, Glendale Chard, Letta Moynahan in Treatment: 5 Vital Signs Time Taken: 13:30 Temperature (F): 98.9 Height (in): 62 Pulse (bpm): 102 Weight (lbs): 112 Respiratory Rate (breaths/min): 22 Body Mass Index (BMI): 20.5 Blood Pressure  (mmHg): 151/61 Reference Range: 80 - 120 mg / dl Electronic Signature(s) Signed: 03/25/2019 5:13:27 PM By: Kela Millin Entered By: Kela Millin on 03/17/2019 13:38:59

## 2019-04-07 ENCOUNTER — Other Ambulatory Visit: Payer: Self-pay

## 2019-04-07 ENCOUNTER — Encounter (HOSPITAL_BASED_OUTPATIENT_CLINIC_OR_DEPARTMENT_OTHER): Payer: Medicare Other | Attending: Internal Medicine | Admitting: Internal Medicine

## 2019-04-07 DIAGNOSIS — I10 Essential (primary) hypertension: Secondary | ICD-10-CM | POA: Insufficient documentation

## 2019-04-07 DIAGNOSIS — J449 Chronic obstructive pulmonary disease, unspecified: Secondary | ICD-10-CM | POA: Insufficient documentation

## 2019-04-07 DIAGNOSIS — W19XXXA Unspecified fall, initial encounter: Secondary | ICD-10-CM | POA: Insufficient documentation

## 2019-04-07 DIAGNOSIS — I87323 Chronic venous hypertension (idiopathic) with inflammation of bilateral lower extremity: Secondary | ICD-10-CM | POA: Diagnosis not present

## 2019-04-07 DIAGNOSIS — L97812 Non-pressure chronic ulcer of other part of right lower leg with fat layer exposed: Secondary | ICD-10-CM | POA: Diagnosis not present

## 2019-04-07 DIAGNOSIS — S8002XA Contusion of left knee, initial encounter: Secondary | ICD-10-CM | POA: Diagnosis not present

## 2019-04-07 DIAGNOSIS — I890 Lymphedema, not elsewhere classified: Secondary | ICD-10-CM | POA: Insufficient documentation

## 2019-04-07 NOTE — Progress Notes (Signed)
[name] ([id number]) Visit Report for 04/07/2019 HPI Details Patient Name: Date of Service: [name] 04/07/2019 12:30 PM Medical Record [id number] Patient Account Number: [id number] Date of Birth/Sex: Treating RN: [birth year] (73 y.o. 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 6 weeks ago she had a fall and suffered a contusion on her left anterior patella area. She saw her primary doctor she has been prescribed Silvadene cream and cephalexin on 8/7 for 1 week. The area is not progressing towards closure and she is here for our review of this. Incidentally we were able to also discover an area on the right anterior lower tibial area which is a small wound that we think may have been there for about 1 week. The patient saw Dr. early on 11/23/2018. He did not think the patient had any arterial issues based on triphasic waveforms with his hand-held Dopplers. She had venous reflux studies that showed abnormal reflux times in the common femoral vein, great saphenous vein in the distal thigh great saphenous vein at the knee and great saphenous vein at the mid calf similar findings on the left but he did not think that she was a candidate for ablation stating that she had mild reflux in the great saphenous vein proximally but no dilation. There was no DVT Past medical history includes COPD, pulmonary hypertension, coronary disease, hypertension, chronic lower extremity edema ABIs in our clinic were 1.02 on the right and 0.86 on the left 8/28; patient readmitted to the clinic last week. Traumatic wound on the left anterior patella in the setting of  fairly significant chronic venous changes. She also has a small area on the right anterior mid tibia area. 9/4; traumatic wound on the left anterior patella. We have been using Medihoney as the patient could not afford Santyl. This is really not doing improving things. We moved to Iodoflex today. Also unfortunately the area on the right anterior mid tibia is not healed 9/24; traumatic wound on the left anterior patella had a greenish drainage on the dressing. This is led to a culture being done of this area. I changed to Iodoflex last time this may have something to do with the color. The area on the right anterior tibia is actually larger. 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 This looks somewhat better. He has an area just below this on the left which is superficial. Finally she has the area on the right anterior pretibial area which looks better and more superficial. Extremely damaged skin on the bilateral lower extremities I think which is mostly secondary to longstanding lymphedema. She does not want the right leg wrapped largely because she is developing fluid above the wraps just below her knee and in the thigh. I am concerned about not wrapping it however she has made her wishes clear 10/8; traumatic wound on the left anterior patella is smaller. We have been using silver alginate. She has 2 closely opposed wounds in the right anterior mid tibia. Some swelling and erythema in this area but no tenderness we have been using silver alginate here as well. She will not allow compression wraps. She  has a compression stocking for the right lower leg 10/22; patient is not been here in 2 weeks. She has a injury on the left anterior patellar tendon area from a fall. She has broken down areas anteriorly on the right and today right lateral  from severe venous inflammation with secondary lymphedema. She has not previously agreed to compression wraps. She has juxta lite stockings although these were not applied properly today. I am not clear what she has been putting on the wounds. She complains of a lot of pain on the right lateral calf Electronic Signature(s) Signed: 04/07/2019 6:29:04 PM By: Crystal Ham MD Entered By: [name] on 04/07/2019 14:17:27 -------------------------------------------------------------------------------- Physical Exam Details Patient Name: Date of Service: Crystal Fray F. 04/07/2019 12:30 PM Medical Record [id number] Patient Account Number: [id number] Date of Birth/Sex: Treating RN: 09-13-1945 (73 y.o. [name] Primary Care Provider: Christain [name] Other Clinician: Referring Provider: Treating Provider/Extender:[name], [name], [name] in Treatment: 8 Constitutional Patient is hypertensive.. Pulse regular and within target range for patient.Marland Kitchen Respirations regular, non-labored and within target range.. Temperature is normal and within the target range for the patient.Marland Kitchen Appears in no distress. Eyes Conjunctivae clear. No discharge.no icterus. Respiratory Respiratory status is stable oxygen on. Cardiovascular Pedal pulses are palpable. Psychiatric appears at normal baseline. Notes Wound exam; on the left patella. I think this looks better. No debridement is required On the right she has superficial to epithelial loss which anteriorly and a large area laterally. A lot of weeping edema Electronic Signature(s) Signed: 04/07/2019 6:29:04 PM By: Crystal Ham MD Entered By: [name] on 04/07/2019 14:18:37 -------------------------------------------------------------------------------- Physician Orders Details Patient Name: Date of Service: [name] 04/07/2019 12:30 PM Medical Record [id number] Patient Account Number:  [id number] Date of Birth/Sex: Treating RN: 05-21-1946 (73 y.o. [name] Primary Care Provider: Christain [name] Other Clinician: Referring Provider: Treating Provider/Extender:Crystal Klammer, [name], [name] in Treatment: 8 Verbal / Phone Orders: No Diagnosis Coding ICD-10 Coding Code Description S80.02XD Contusion of left knee, subsequent encounter L97.811 Non-pressure chronic ulcer of other part of right lower leg limited to breakdown of skin I87.323 Chronic venous hypertension (idiopathic) with inflammation of bilateral lower extremity I89.0 Lymphedema, not elsewhere classified Follow-up Appointments Return Appointment in 1 week. - Monday or Tuesday Dressing Change Frequency Wound #1 Left,Anterior Knee Change Dressing every other day. Wound #2 Right,Anterior Lower Leg Do not change entire dressing for one week. Wound #4 Right,Lateral Lower Leg Do not change entire dressing for one week. Skin Barriers/Peri-Wound Care Moisturizing lotion - patient to apply lotion to left leg at night. TCA Cream or Ointment - liberally with lotion to both legs in clinic. Wound Cleansing Wound #1 Left,Anterior Knee May shower and wash wound with soap and water. Wound #2 Right,Anterior Lower Leg May shower with protection. Wound #4 Right,Lateral Lower Leg May shower with protection. Primary Wound Dressing Wound #1 Left,Anterior Knee Calcium Alginate with Silver Wound #2 Right,Anterior Lower Leg Calcium Alginate with Silver Wound #4 Right,Lateral Lower Leg Calcium Alginate with Silver Secondary Dressing Wound #1 Left,Anterior Knee Foam Border Wound #2 Right,Anterior Lower Leg ABD pad Kerramax - or zetvuit. Wound #4 Right,Lateral Lower Leg ABD pad Kerramax - or zetvuit. Edema Control 3 Layer Compression System - Right Lower Extremity Avoid standing for long periods of time Elevate legs to the level of the heart or above for 30 minutes daily and/or when sitting, a  frequency of: - throughout the day Exercise regularly Support Garment 30-40 mm/Hg pressure  to: - patient to wear juxtalite HD on left leg. Apply in the am and remove at night. Patient to ensure to measure how tight the compression is daily. Please show patient how to apply juxtalite. Patient Medications Allergies: tramadol Notifications Medication Indication Start End cephalexin cellulitis right leg10/22/2020 DOSE oral 500 mg capsule - 1 capsule oral q8h for 7 days Electronic Signature(s) Signed: 04/07/2019 2:15:10 PM By: Crystal Ham MD Entered By: [name] on 04/07/2019 14:15:08 -------------------------------------------------------------------------------- Problem List Details Patient Name: Date of Service: [name]. 04/07/2019 12:30 PM Medical Record [id number] Patient Account Number: [id number] Date of Birth/Sex: Treating RN: [birth year] (73 y.o. [name] Primary Care Provider: Christain [name] Other Clinician: Referring Provider: Treating Provider/Extender:Quatavious Rossa, [name], [name] in Treatment: 8 Active Problems ICD-10 Evaluated Encounter Code Description Active Date Today Diagnosis S80.02XD Contusion of left knee, subsequent encounter 02/04/2019 No Yes L97.811 Non-pressure chronic ulcer of other part of right lower 03/24/2019 No Yes leg limited to breakdown of skin I87.323 Chronic venous hypertension (idiopathic) with 02/04/2019 No Yes inflammation of bilateral lower extremity I89.0 Lymphedema, not elsewhere classified 02/04/2019 No Yes Inactive Problems ICD-10 Code Description Active Date Inactive Date L97.511 Non-pressure chronic ulcer of other part of right foot limited to 02/11/2019 02/11/2019 breakdown of skin Resolved Problems Electronic Signature(s) Signed: 04/07/2019 6:29:04 PM By: Crystal Ham MD Entered By: [name] on 04/07/2019  14:16:01 -------------------------------------------------------------------------------- Progress Note Details Patient Name: Date of Service: [name] 04/07/2019 12:30 PM Medical Record [id number] Patient Account Number: [id number] Date of Birth/Sex: Treating RN: 1946-03-14 (73 y.o. [name] Primary Care Provider: Christain [name] Other Clinician: Referring Provider: Treating Provider/Extender:Chibueze Beasley, [name], Jama Flavors Weeks in Treatment: 8 Subjective History of Present Illness (HPI) ADMISSION 02/04/2019 Mrs. 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 6 weeks ago she had a fall and suffered a contusion on her left anterior patella area. She saw her primary doctor she has been prescribed Silvadene cream and cephalexin on 8/7 for 1 week. The area is not progressing towards closure and she is here for our review of this. Incidentally we were able to also discover an area on the right anterior lower tibial area which is a small wound that we think may have been there for about 1 week. The patient saw Dr. early on 11/23/2018. He did not think the patient had any arterial issues based on triphasic waveforms with his hand-held Dopplers. She had venous reflux studies that showed abnormal reflux times in the common femoral vein, great saphenous vein in the distal thigh great saphenous vein at the knee and great saphenous vein at the mid calf similar findings on the left but he did not think that she was a candidate for ablation stating that she had mild reflux in the great saphenous vein proximally but no dilation. There was no DVT Past medical history includes COPD, pulmonary hypertension, coronary disease, hypertension, chronic lower extremity edema ABIs in our clinic were 1.02 on the right and 0.86 on the left 8/28; patient readmitted to the clinic last week. Traumatic wound on the left  anterior patella in the setting of fairly significant chronic venous changes. She also has a small area on the right anterior mid tibia area. 9/4; traumatic wound on the left anterior patella. We have been using Medihoney as the patient could not afford Santyl. This is really not doing improving things. We moved to Iodoflex today.  Also unfortunately the area on the right anterior mid tibia is not healed 9/24; traumatic wound on the left anterior patella had a greenish drainage on the dressing. This is led to a culture being done of this area. I changed to Iodoflex last time this may have something to do with the color. The area on the right anterior tibia is actually larger. 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 This looks somewhat better. He has an area just below this on the left which is superficial. Finally she has the area on the right anterior pretibial area which looks better and more superficial. Extremely damaged skin on the bilateral lower extremities I think which is mostly secondary to longstanding lymphedema. She does not want the right leg wrapped largely because she is developing fluid above the wraps just below her knee and in the thigh. I am concerned about not wrapping it however she has made her wishes clear 10/8; traumatic wound on the left anterior patella is smaller. We have been using silver alginate. She has 2 closely opposed wounds in the right anterior mid tibia. Some swelling and erythema in this area but no tenderness we have been using silver alginate here as well. She will not allow compression wraps. She has a compression stocking for the right lower leg 10/22; patient is not been here in 2 weeks. She has a injury on the left anterior patellar tendon area from a fall. She has broken down areas anteriorly  on the right and today right lateral from severe venous inflammation with secondary lymphedema. She has not previously agreed to compression wraps. She has juxta lite stockings although these were not applied properly today. I am not clear what she has been putting on the wounds. She complains of a lot of pain on the right lateral calf Objective Constitutional Patient is hypertensive.. Pulse regular and within target range for patient.Marland Kitchen Respirations regular, non-labored and within target range.. Temperature is normal and within the target range for the patient.Marland Kitchen Appears in no distress. Vitals Time Taken: 1:15 PM, Height: 62 in, Weight: 112 lbs, BMI: 20.5, Temperature: 98.7 F, Pulse: 104 bpm, Respiratory Rate: 22 breaths/min, Blood Pressure: 157/96 mmHg. Eyes Conjunctivae clear. No discharge.no icterus. Respiratory Respiratory status is stable oxygen on. Cardiovascular Pedal pulses are palpable. Psychiatric appears at normal baseline. General Notes: Wound exam; on the left patella. I think this looks better. No debridement is required ooOn the right she has superficial to epithelial loss which anteriorly and a large area laterally. A lot of weeping edema Integumentary (Hair, Skin) Wound #1 status is Open. Original cause of wound was Trauma. The wound is located on the Left,Anterior Knee. The wound measures 4cm length x 2cm width x 0.2cm depth; 6.283cm^2 area and 1.257cm^3 volume. There is Fat Layer (Subcutaneous Tissue) Exposed exposed. There is no tunneling or undermining noted. There is a medium amount of serosanguineous drainage noted. The wound margin is well defined and not attached to the wound base. There is small (1-33%) pink, pale granulation within the wound bed. There is a large (67-100%) amount of necrotic tissue within the wound bed including Adherent Slough. Wound #2 status is Open. Original cause of wound was Gradually Appeared. The wound is located on the Right,Anterior  Lower Leg. The wound measures 1.5cm length x 2.9cm width x 0.1cm depth; 3.416cm^2 area and 0.342cm^3 volume. There is Fat Layer (Subcutaneous Tissue)  Exposed exposed. There is no tunneling or undermining noted. There is a medium amount of serous drainage noted. The wound margin is flat and intact. There is medium (34-66%) pink, pale granulation within the wound bed. There is a medium (34-66%) amount of necrotic tissue within the wound bed including Adherent Slough. Wound #4 status is Open. Original cause of wound was Gradually Appeared. The wound is located on the Right,Lateral Lower Leg. The wound measures 3.5cm length x 3.8cm width x 0.1cm depth; 10.446cm^2 area and 1.045cm^3 volume. There is Fat Layer (Subcutaneous Tissue) Exposed exposed. There is no tunneling or undermining noted. There is a medium amount of serous drainage noted. The wound margin is distinct with the outline attached to the wound base. There is medium (34-66%) pink, pale granulation within the wound bed. There is a medium (34-66%) amount of necrotic tissue within the wound bed including Adherent Slough. Assessment Active Problems ICD-10 Contusion of left knee, subsequent encounter Non-pressure chronic ulcer of other part of right lower leg limited to breakdown of skin Chronic venous hypertension (idiopathic) with inflammation of bilateral lower extremity Lymphedema, not elsewhere classified Procedures Wound #2 Pre-procedure diagnosis of Wound #2 is a Venous Leg Ulcer located on the Right,Anterior Lower Leg . There was a Three Layer Compression Therapy Procedure with a pre-treatment ABI of 1 by Kela Millin, RN. Post procedure Diagnosis Wound #2: Same as Pre-Procedure Wound #4 Pre-procedure diagnosis of Wound #4 is a Venous Leg Ulcer located on the Right,Lateral Lower Leg . There was a Three Layer Compression Therapy Procedure with a pre-treatment ABI of 1 by Kela Millin, RN. Post procedure Diagnosis Wound  #4: Same as Pre-Procedure Plan Follow-up Appointments: Return Appointment in 1 week. - Monday or Tuesday Dressing Change Frequency: Wound #1 Left,Anterior Knee: Change Dressing every other day. Wound #2 Right,Anterior Lower Leg: Do not change entire dressing for one week. Wound #4 Right,Lateral Lower Leg: Do not change entire dressing for one week. Skin Barriers/Peri-Wound Care: Moisturizing lotion - patient to apply lotion to left leg at night. TCA Cream or Ointment - liberally with lotion to both legs in clinic. Wound Cleansing: Wound #1 Left,Anterior Knee: May shower and wash wound with soap and water. Wound #2 Right,Anterior Lower Leg: May shower with protection. Wound #4 Right,Lateral Lower Leg: May shower with protection. Primary Wound Dressing: Wound #1 Left,Anterior Knee: Calcium Alginate with Silver Wound #2 Right,Anterior Lower Leg: Calcium Alginate with Silver Wound #4 Right,Lateral Lower Leg: Calcium Alginate with Silver Secondary Dressing: Wound #1 Left,Anterior Knee: Foam Border Wound #2 Right,Anterior Lower Leg: ABD pad Kerramax - or zetvuit. Wound #4 Right,Lateral Lower Leg: ABD pad Kerramax - or zetvuit. Edema Control: 3 Layer Compression System - Right Lower Extremity Avoid standing for long periods of time Elevate legs to the level of the heart or above for 30 minutes daily and/or when sitting, a frequency of: - throughout the day Exercise regularly Support Garment 30-40 mm/Hg pressure to: - patient to wear juxtalite HD on left leg. Apply in the am and remove at night. Patient to ensure to measure how tight the compression is daily. Please show patient how to apply juxtalite. The following medication(s) was prescribed: cephalexin oral 500 mg capsule 1 capsule oral q8h for 7 days for cellulitis right leg starting 04/07/2019 1. We continued with with silver alginate to the left anterior knee 2. Silver alginate on the right leg to anything that is  weeping. ABDs she finally has agreed to 3 layer compression I would like to see  her back next week to see if we can get some of the fluid and edema out of this leg and help her skin in terms of the stasis dermatitis. 3. Empiric Keflex since I cannot be certain about cellulitis on the lateral leg although I think all of this is stasis dermatitis 4. We will show her how to use the juxta lite stockings again Electronic Signature(s) Signed: 04/07/2019 6:29:04 PM By: Crystal Ham MD Entered By: [name] on 04/07/2019 14:19:58 -------------------------------------------------------------------------------- SuperBill Details Patient Name: Date of Service: Demaree, Medha F. 04/07/2019 Medical Record [id number] Patient Account Number: [id number] Date of Birth/Sex: Treating RN: 1945-11-22 (73 y.o. [name] Primary Care Provider: Christain [name] Other Clinician: Referring Provider: Treating Provider/Extender:Johntavious Francom, [name], Jama Flavors Weeks in Treatment: 8 Diagnosis Coding ICD-10 Codes Code Description S80.02XD Contusion of left knee, subsequent encounter L97.811 Non-pressure chronic ulcer of other part of right lower leg limited to breakdown of skin I87.323 Chronic venous hypertension (idiopathic) with inflammation of bilateral lower extremity I89.0 Lymphedema, not elsewhere classified Facility Procedures The patient participates with Medicare or their insurance follows the Medicare Facility Guidelines: CPT4 Code Description Modifier Quantity [phone] (Facility Use Only) [phone] - [place] LWR RT LEG 1 Physician Procedures CPT4 Code Description: [id number] - WC PHYS LEVEL 3 - EST PT ICD-10 Diagnosis Description L97.811 Non-pressure chronic ulcer of other part of right lower leg skin S80.02XD Contusion of left knee, subsequent encounter I87.323 Chronic venous  hypertension (idiopathic) with inflammation extremity Modifier: limited to breakdow of  bilateral lower Quantity: 1 n of Electronic Signature(s) Signed: 04/07/2019 6:29:04 PM By: Crystal Ham MD Entered By: [name] on 04/07/2019 14:20:35

## 2019-04-11 ENCOUNTER — Encounter (HOSPITAL_BASED_OUTPATIENT_CLINIC_OR_DEPARTMENT_OTHER): Payer: Medicare Other | Admitting: Internal Medicine

## 2019-04-11 ENCOUNTER — Other Ambulatory Visit: Payer: Self-pay

## 2019-04-11 DIAGNOSIS — S8002XA Contusion of left knee, initial encounter: Secondary | ICD-10-CM | POA: Diagnosis not present

## 2019-04-11 NOTE — Progress Notes (Signed)
[name], [name] ([id number]) Visit Report for 04/07/2019 Arrival Information Details Patient Name: Date of Service: [name], [name] 04/07/2019 12:30 PM Medical Record [id number] Patient Account Number: [id number] Date of Birth/Sex: Treating RN: 07/04/1945 (73 y.o. [name] Primary Care [name]: [name] Other Clinician: Referring [name]: Treating [name], [name], [name] in Treatment: 8 Visit Information History Since Last Visit Wheel Chair Added or deleted any medications: No Patient Arrived: Any new allergies or adverse reactions: No Arrival Time: 13:14 Had a fall or experienced change in No Accompanied By: family activities of daily living that may affect member risk of falls: Transfer Assistance: None Signs or symptoms of abuse/neglect since last No Patient Identification Verified: Yes visito Secondary Verification Process Yes Hospitalized since last visit: No Completed: Implantable device outside of the clinic excluding No Patient Requires Transmission-Based No cellular tissue based products placed in the center Precautions: since last visit: Patient Has Alerts: No Has Dressing in Place as Prescribed: Yes Has Compression in Place as Prescribed: Yes Pain Present Now: Yes Electronic Signature(s) Signed: 04/11/2019 5:21:50 PM By: [name] Entered By: [name] on 04/07/2019 13:15:11 -------------------------------------------------------------------------------- Compression Therapy Details Patient Name: Date of Service: [name], [name] 04/07/2019 12:30 PM Medical Record [id number] Patient Account Number: [id number] Date of Birth/Sex: Treating RN: 1946-01-11 (73 y.o. [name] Primary Care [name]: [name] Other Clinician: Referring [name]: Treating [name], [name], [name] in Treatment: 8 Compression Therapy Performed for Wound  Wound #2 Right,Anterior Lower Leg Assessment: Performed By: Clinician Crystal Millin, RN Compression Type: Three Layer Pre Treatment ABI: 1 Post Procedure Diagnosis Same as Pre-procedure Electronic Signature(s) Signed: 04/07/2019 6:35:09 PM By: Deon Pilling Entered By: Deon Pilling on 04/07/2019 14:10:13 -------------------------------------------------------------------------------- Compression Therapy Details Patient Name: Date of Service: [name], [name] 04/07/2019 12:30 PM Medical Record [id number] Patient Account Number: [id number] Date of Birth/Sex: Treating RN: 12-16-1945 (73 y.o. [name] Primary Care Jammy Stlouis: [name] Other Clinician: Referring Ismar Yabut: Treating [name]/Extender:[name], [name], [name] in Treatment: 8 Compression Therapy Performed for Wound Wound #4 Right,Lateral Lower Leg Assessment: Performed By: Clinician Crystal Millin, RN Compression Type: Three Layer Pre Treatment ABI: 1 Post Procedure Diagnosis Same as Pre-procedure Electronic Signature(s) Signed: 04/07/2019 6:35:09 PM By: Deon Pilling Entered By: Deon Pilling on 04/07/2019 14:10:13 -------------------------------------------------------------------------------- Encounter Discharge Information Details Patient Name: Date of Service: [name]. 04/07/2019 12:30 PM Medical Record [id number] Patient Account Number: [id number] Date of Birth/Sex: Treating RN: 03-24-1946 (73 y.o. [name] Primary Care [name]: [name] Other Clinician: Referring [name]: Treating [name], [name], [name] in Treatment: 8 Encounter Discharge Information Items Discharge Condition: Stable Ambulatory Status: Wheelchair Discharge Destination: Home Transportation: Private Auto Accompanied By: family member Schedule Follow-up Appointment: Yes Clinical Summary of Care: Patient Declined Electronic  Signature(s) Signed: 04/11/2019 5:21:50 PM By: [name] Entered By: [name] on 04/07/2019 14:51:41 -------------------------------------------------------------------------------- Lower Extremity Assessment Details Patient Name: Date of Service: [name], [name] 04/07/2019 12:30 PM Medical Record [id number] Patient Account Number: [id number] Date of Birth/Sex: Treating RN: 1945-09-06 (73 y.o. [name] Primary Care [name]: [name] Other Clinician: Referring [name]: Treating [name], [name], [name] in Treatment: 8 Edema Assessment Assessed: [Left: No] [Right: No] Edema: [Left: Yes] [Right: Yes] Calf Left: Right: Point of Measurement: 29 cm From Medial Instep 33 cm 33.9 cm Ankle Left: Right: Point of Measurement: 9 cm From Medial Instep 23 cm 22 cm Vascular Assessment Pulses: Dorsalis Pedis Palpable: [  Left:Yes] [Right:Yes] Electronic Signature(s) Signed: 04/11/2019 5:21:50 PM By: [name] Entered By: [name] on 04/07/2019 13:26:39 -------------------------------------------------------------------------------- Multi Wound Chart Details Patient Name: Date of Service: [name] 04/07/2019 12:30 PM Medical Record [id number] Patient Account Number: [id number] Date of Birth/Sex: Treating RN: [birth year] (73 y.o. [name], [name] Primary Care [name]: [name] Other Clinician: Referring [name]: Treating [name], [name], [name] in Treatment: 8 Vital Signs Height(in): 25 Pulse(bpm): 104 Weight(lbs): 112 Blood Pressure(mmHg): 157/96 Body Mass Index(BMI): 20 Temperature(F): 98.7 Respiratory 22 Rate(breaths/min): Photos: [1:No Photos] [2:No Photos] [4:No Photos] Wound Location: [1:Left Knee - Anterior] [2:Right Lower Leg - Anterior Right Lower Leg - Lateral] Wounding Event: [1:Trauma] [2:Gradually Appeared] [4:Gradually  Appeared] Primary Etiology: [1:Abrasion] [2:Venous Leg Ulcer] [4:Venous Leg Ulcer] Comorbid History: [1:Cataracts, Chronic Obstructive Pulmonary Disease (COPD), Hypertension, Peripheral Hypertension, Peripheral Hypertension, Peripheral Venous Disease] [2:Cataracts, Chronic Obstructive Pulmonary Disease (COPD), Venous Disease]  [4:Cataracts, Chronic Obstructive Pulmonary Disease (COPD), Venous Disease] Date Acquired: [1:01/10/2019] [2:01/28/2019] [4:03/31/2019] [name] of Treatment: [1:8] [2:8] [4:0] Wound Status: [1:Open] [2:Open] [4:Open] Clustered Wound: [1:No] [2:Yes] [4:No] Measurements L x W x D 4x2x0.2 [2:1.5x2.9x0.1] [4:3.5x3.8x0.1] (cm) Area (cm) : [1:6.283] [2:3.416] [4:10.446] Volume (cm) : [1:1.257] [2:0.342] [4:1.045] % Reduction in Area: [1:52.40%] [2:-625.30%] [4:N/A] % Reduction in Volume: 68.20% [2:-627.70%] [4:N/A] Classification: [1:Full Thickness Without Exposed Support Structures Exposed Support Structures Exposed Support Structures] [2:Full Thickness Without] [4:Full Thickness Without] Exudate Amount: [1:Medium] [2:Medium] [4:Medium] Exudate Type: [1:Serosanguineous] [2:Serous] [4:Serous] Exudate Color: [1:red, brown] [2:amber] [4:amber] Wound Margin: [1:Well defined, not attached Flat and Intact] [4:Distinct, outline attached] Granulation Amount: [1:Small (1-33%)] [2:Medium (34-66%)] [4:Medium (34-66%)] Granulation Quality: [1:Pink, Pale] [2:Pink, Pale] [4:Pink, Pale] Necrotic Amount: [1:Large (67-100%)] [2:Medium (34-66%)] [4:Medium (34-66%)] Exposed Structures: [1:Fat Layer (Subcutaneous Fat Layer (Subcutaneous Fat Layer (Subcutaneous Tissue) Exposed: Yes Fascia: No Tendon: No Muscle: No Joint: No Bone: No] [2:Tissue) Exposed: Yes Fascia: No Tendon: No Muscle: No Joint: No Bone: No] [4:Tissue) Exposed: Yes  Fascia: No Tendon: No Muscle: No Joint: No Bone: No] Epithelialization: [1:None] [2:None Compression Therapy] [4:None Compression Therapy] Treatment  Notes Electronic Signature(s) Signed: 04/07/2019 6:29:04 PM By: Linton Ham MD Signed: 04/07/2019 6:35:09 PM By: Deon Pilling Entered By: Linton Ham on 04/07/2019 14:16:08 -------------------------------------------------------------------------------- Multi-Disciplinary Care Plan Details Patient Name: Date of Service: [name] 04/07/2019 12:30 PM Medical Record [id number] Patient Account Number: [id number] Date of Birth/Sex: Treating RN: 17-Sep-1945 (73 y.o. [name], Tammi Klippel Primary Care Ellorie Kindall: [name] Other Clinician: Referring Shandy Vi: Treating Waynesha Rammel/Extender:[name], [name], [name] in Treatment: 8 Active Inactive Abuse / Safety / Falls / Self Care Management Nursing Diagnoses: History of Falls Potential for falls Goals: Patient will remain injury free related to falls Date Initiated: 02/04/2019 Target Resolution Date: 04/15/2019 Goal Status: Active Patient/caregiver will verbalize/demonstrate measures taken to prevent injury and/or falls Date Initiated: 02/04/2019 Date Inactivated: 03/10/2019 Target Resolution Date: 03/11/2019 Goal Status: Met Interventions: Assess fall risk on admission and as needed Assess: immobility, friction, shearing, incontinence upon admission and as needed Assess personal safety and home safety (as indicated) on admission and as needed Notes: Venous Leg Ulcer Nursing Diagnoses: Actual venous Insuffiency (use after diagnosis is confirmed) Knowledge deficit related to disease process and management Goals: Patient will maintain optimal edema control Date Initiated: 02/04/2019 Target Resolution Date: 04/15/2019 Goal Status: Active Patient/caregiver will verbalize understanding of disease process and disease management Date Initiated: 02/04/2019 Date Inactivated: 03/10/2019 Target Resolution Date: 03/11/2019 Goal Status: Met Interventions: Assess peripheral edema status every visit. Compression as  ordered Provide  education on venous insufficiency Treatment Activities: Therapeutic compression applied : 02/04/2019 Notes: Wound/Skin Impairment Nursing Diagnoses: Impaired tissue integrity Knowledge deficit related to ulceration/compromised skin integrity Goals: Patient/caregiver will verbalize understanding of skin care regimen Date Initiated: 02/04/2019 Target Resolution Date: 04/15/2019 Goal Status: Active Ulcer/skin breakdown will have a volume reduction of 30% by week 4 Date Initiated: 02/04/2019 Date Inactivated: 04/07/2019 Target Resolution Date: 03/11/2019 Unmet Reason: see wound Goal Status: Unmet measurements. Interventions: Assess patient/caregiver ability to obtain necessary supplies Assess patient/caregiver ability to perform ulcer/skin care regimen upon admission and as needed Assess ulceration(s) every visit Provide education on ulcer and skin care Treatment Activities: Skin care regimen initiated : 02/04/2019 Topical wound management initiated : 02/04/2019 Notes: Electronic Signature(s) Signed: 04/07/2019 6:35:09 PM By: Deon Pilling Entered By: Deon Pilling on 04/07/2019 13:54:06 -------------------------------------------------------------------------------- Pain Assessment Details Patient Name: Date of Service: [name], [name] 04/07/2019 12:30 PM Medical Record [id number] Patient Account Number: [id number] Date of Birth/Sex: Treating RN: [birth year] (73 y.o. [name] Primary Care Sharine Cadle: [name] Other Clinician: Referring Bernardino Dowell: Treating Darenda Fike/Extender:[name], [name], [name] in Treatment: 8 Active Problems Location of Pain Severity and Description of Pain Patient Has Paino Yes Site Locations Pain Location: Pain in Ulcers With Dressing Change: Yes Duration of the Pain. Constant / Intermittento Constant Rate the pain. Current Pain Level: 6 Worst Pain Level: 10 Least Pain Level: 6 Tolerable Pain  Level: 6 Character of Pain Describe the Pain: Burning, Stabbing Pain Management and Medication Current Pain Management: Electronic Signature(s) Signed: 04/11/2019 5:21:50 PM By: [name] Entered By: [name] on 04/07/2019 13:25:37 -------------------------------------------------------------------------------- Patient/Caregiver Education Details Patient Name: Date of Service: [name] 10/22/2020andnbsp12:30 PM Medical Record Patient Account Number: [id number] Number: Treating RN: Deon Pilling Date of Birth/Gender: 02-28-1946 (73 y.o. F) Other Clinician: Primary Care Physician: [name] Treating Linton Ham Referring Physician: Physician/Extender: Mindi Junker in Treatment: 8 Education Assessment Education Provided To: Patient Education Topics Provided Venous: Handouts: Controlling Swelling with Compression Stockings Methods: Explain/Verbal Responses: Reinforcements needed Electronic Signature(s) Signed: 04/07/2019 6:35:09 PM By: Deon Pilling Entered By: Deon Pilling on 04/07/2019 13:54:30 -------------------------------------------------------------------------------- Wound Assessment Details Patient Name: Date of Service: [name], [name] 04/07/2019 12:30 PM Medical Record [id number] Patient Account Number: [id number] Date of Birth/Sex: Treating RN: 07-30-1945 (73 y.o. [name] Primary Care Kashara Blocher: [name] Other Clinician: Referring Saida Lonon: Treating Maronda Caison/Extender:[name], [name], [name] in Treatment: 8 Wound Status Wound Number: 1 Primary Abrasion Etiology: Wound Location: Left Knee - Anterior Wound Open Wounding Event: Trauma Status: Date Acquired: 01/10/2019 Comorbid Cataracts, Chronic Obstructive Pulmonary [name] Of Treatment: 8 History: Disease (COPD), Hypertension, Peripheral Clustered Wound: No Venous Disease Photos Wound Measurements Length: (cm) 4  % Reduc Width: (cm) 2 % Reduc Depth: (cm) 0.2 Epithel Area: (cm) 6.283 Tunnel Volume: (cm) 1.257 Underm Wound Description Full Thickness Without Exposed Support Classification: Structures Wound Well defined, not attached Margin: Exudate Medium Amount: Exudate Serosanguineous Type: Exudate red, brown Color: Wound Bed Granulation Amount: Small (1-33%) Granulation Quality: Pink, Pale Necrotic Amount: Large (67-100%) Necrotic Quality: Adherent Slough Foul Odor After Cleansing: No Slough/Fibrino Yes Exposed Structure Fascia Exposed: No Fat Layer (Subcutaneous Tissue) Exposed: Yes Tendon Exposed: No Muscle Exposed: No Joint Exposed: No Bone Exposed: No tion in Area: 52.4% tion in Volume: 68.2% ialization: None ing: No ining: No Electronic Signature(s) Signed: 04/08/2019 4:09:04 PM By: Mikeal Hawthorne EMT/HBOT Signed: 04/11/2019 5:21:50 PM By: [name] Entered By: Mikeal Hawthorne on 04/08/2019 13:21:06 -------------------------------------------------------------------------------- Wound  Assessment Details Patient Name: Date of Service: [name], [name] 04/07/2019 12:30 PM Medical Record [id number] Patient Account Number: [id number] Date of Birth/Sex: Treating RN: 10-07-1945 (73 y.o. [name] Primary Care Menachem Urbanek: [name] Other Clinician: Referring Debbe Crumble: Treating Shellby Schlink/Extender:[name], [name], [name] in Treatment: 8 Wound Status Wound Number: 2 Primary Venous Leg Ulcer Etiology: Wound Location: Right Lower Leg - Anterior Wound Open Wounding Event: Gradually Appeared Status: Date Acquired: 01/28/2019 Comorbid Cataracts, Chronic Obstructive Pulmonary [name] Of Treatment: 8 History: Disease (COPD), Hypertension, Peripheral Clustered Wound: Yes Venous Disease Photos Wound Measurements Length: (cm) 1.5 % Reduc Width: (cm) 2.9 % Reduc Depth: (cm) 0.1 Epithel Area: (cm) 3.416 Tunnel Volume: (cm) 0.342  Underm Wound Description Classification: Full Thickness Without Exposed Support Foul O Structures Slough Wound Flat and Intact Margin: Exudate Medium Amount: Exudate Serous Serous Type: Exudate amber Color: Wound Bed Granulation Amount: Medium (34-66%) Granulation Quality: Pink, Pale Fascia Exp Necrotic Amount: Medium (34-66%) Fat Layer Necrotic Quality: Adherent Slough Tendon Exp Muscle Exp Joint Expo Bone Expos dor After Cleansing: No /Fibrino Yes Exposed Structure osed: No (Subcutaneous Tissue) Exposed: Yes osed: No osed: No sed: No ed: No tion in Area: -625.3% tion in Volume: -627.7% ialization: None ing: No ining: No Electronic Signature(s) Signed: 04/08/2019 4:09:04 PM By: Mikeal Hawthorne EMT/HBOT Signed: 04/11/2019 5:21:50 PM By: [name] Entered By: Mikeal Hawthorne on 04/08/2019 13:20:40 -------------------------------------------------------------------------------- Wound Assessment Details Patient Name: Date of Service: Crystal Fray F. 04/07/2019 12:30 PM Medical Record [id number] Patient Account Number: [id number] Date of Birth/Sex: Treating RN: 05-17-1946 (73 y.o. [name] Primary Care Sukhman Martine: [name] Other Clinician: Referring Paras Kreider: Treating Leola Fiore/Extender:[name], [name], [name] in Treatment: 8 Wound Status Wound Number: 4 Primary Venous Leg Ulcer Etiology: Wound Location: Right Lower Leg - Lateral Wound Open Wounding Event: Gradually Appeared Status: Date Acquired: 03/31/2019 Comorbid Cataracts, Chronic Obstructive Pulmonary [name] Of Treatment: 0 History: Disease (COPD), Hypertension, Peripheral Clustered Wound: No Venous Disease Photos Wound Measurements Length: (cm) 3.5 % Reductio Width: (cm) 3.8 % Reductio Depth: (cm) 0.1 Epithelial Area: (cm) 10.446 Tunneling Volume: (cm) 1.045 Underm Wound Description Full Thickness Without Exposed Support Foul O Classification:  Structures Slough Wound Distinct, outline attached Margin: Exudate Medium Amount: Exudate Serous Type: Exudate amber Color: Wound Bed Granulation Amount: Medium (34-66%) Granulation Quality: Pink, Pale Fascia Necrotic Amount: Medium (34-66%) Fat Lay Necrotic Quality: Adherent Slough Tendon Muscle Joint E Bone Ex dor After Cleansing: No /Fibrino Yes Exposed Structure Exposed: No er (Subcutaneous Tissue) Exposed: Yes Exposed: No Exposed: No xposed: No posed: No n in Area: 0% n in Volume: 0% ization: None : No ining: No Electronic Signature(s) Signed: 04/08/2019 4:09:04 PM By: Mikeal Hawthorne EMT/HBOT Signed: 04/11/2019 5:21:50 PM By: [name] Entered By: Mikeal Hawthorne on 04/08/2019 11:51:50 -------------------------------------------------------------------------------- Vitals Details Patient Name: Date of Service: [name]. 04/07/2019 12:30 PM Medical Record [id number] Patient Account Number: [id number] Date of Birth/Sex: Treating RN: 1945/09/16 (73 y.o. [name] Primary Care Daryus Sowash: [name] Other Clinician: Referring Keiffer Piper: Treating Kadijah Shamoon/Extender:[name], [name], [name] in Treatment: 8 Vital Signs Time Taken: 13:15 Temperature (F): 98.7 Height (in): 62 Pulse (bpm): 104 Weight (lbs): 112 Respiratory Rate (breaths/min): 22 Body Mass Index (BMI): 20.5 Blood Pressure (mmHg): 157/96 Reference Range: 80 - 120 mg / dl Electronic Signature(s) Signed: 04/11/2019 5:21:50 PM By: [name] Entered By: [name] on 04/07/2019 13:24:57

## 2019-04-11 NOTE — Progress Notes (Signed)
[name], [name] ([id number]) Visit Report for 04/11/2019 Arrival Information Details Patient Name: Date of Service: [name], [name] 04/11/2019 2:30 PM Medical Record [id number] Patient Account Number: [id number] Date of Birth/Sex: Treating RN: 1946-02-18 (73 y.o. [name] Primary Care Kyliyah Stirn: Christain [place] Other Clinician: Referring Brantly Kalman: Treating Ivelis Norgard/Extender:Robson, Glendale Chard, Letta Moynahan in Treatment: 9 Visit Information History Since Last Visit Wheel Chair Added or deleted any medications: No Patient Arrived: Any new allergies or adverse reactions: No Arrival Time: 15:24 Had a fall or experienced change in No Accompanied By: family activities of daily living that may affect member risk of falls: Transfer Assistance: None Signs or symptoms of abuse/neglect since last No Patient Identification Verified: Yes visito Secondary Verification Process Yes Hospitalized since last visit: No Completed: Implantable device outside of the clinic excluding No Patient Requires Transmission-Based No cellular tissue based products placed in the center Precautions: since last visit: Patient Has Alerts: No Has Dressing in Place as Prescribed: Yes Has Compression in Place as Prescribed: Yes Pain Present Now: No Electronic Signature(s) Signed: 04/11/2019 5:21:50 PM By: Kela Millin Entered By: Kela Millin on 04/11/2019 15:25:06 -------------------------------------------------------------------------------- Compression Therapy Details Patient Name: Date of Service: [name]. 04/11/2019 2:30 PM Medical Record [id number] Patient Account Number: [id number] Date of Birth/Sex: Treating RN: [birth year] (73 y.o. [name] Primary Care Edna Grover: Christain [place] Other Clinician: Referring Joffre Lucks: Treating Yaretsi Humphres/Extender:Robson, Glendale Chard, Letta Moynahan in Treatment: 9 Compression Therapy Performed for Wound Wound  #2 Right,Anterior Lower Leg Assessment: Performed By: Clinician Levan Hurst, RN Compression Type: Three Layer Post Procedure Diagnosis Same as Pre-procedure Electronic Signature(s) Signed: 04/11/2019 6:04:10 PM By: Levan Hurst RN, BSN Entered By: Levan Hurst on 04/11/2019 15:52:49 -------------------------------------------------------------------------------- Compression Therapy Details Patient Name: Date of Service: [name], [name] 04/11/2019 2:30 PM Medical Record [id number] Patient Account Number: [id number] Date of Birth/Sex: Treating RN: June 14, 1946 (73 y.o. [name] Primary Care Remingtyn Depaola: Christain [place] Other Clinician: Referring Kentley Blyden: Treating Keighan Amezcua/Extender:Robson, Glendale Chard, Letta Moynahan in Treatment: 9 Compression Therapy Performed for Wound Wound #4 Right,Lateral Lower Leg Assessment: Performed By: Clinician Levan Hurst, RN Compression Type: Three Layer Post Procedure Diagnosis Same as Pre-procedure Electronic Signature(s) Signed: 04/11/2019 6:04:10 PM By: Levan Hurst RN, BSN Entered By: Levan Hurst on 04/11/2019 15:52:50 -------------------------------------------------------------------------------- Encounter Discharge Information Details Patient Name: Date of Service: Brull, [name] 04/11/2019 2:30 PM Medical Record [id number] Patient Account Number: [id number] Date of Birth/Sex: Treating RN: 12/15/1945 (73 y.o. [name] Primary Care Ricco Dershem: Christain [place] Other Clinician: Referring Lenka Zhao: Treating Rochelle Nephew/Extender:Robson, Glendale Chard, Letta Moynahan in Treatment: 9 Encounter Discharge Information Items Discharge Condition: Stable Ambulatory Status: Wheelchair Discharge Destination: Home Transportation: Private Auto Accompanied By: friend Schedule Follow-up Appointment: Yes Clinical Summary of Care: Electronic Signature(s) Signed: 04/11/2019 5:31:38 PM By: Deon Pilling Entered By:  Deon Pilling on 04/11/2019 17:20:46 -------------------------------------------------------------------------------- Lower Extremity Assessment Details Patient Name: Date of Service: [name] 04/11/2019 2:30 PM Medical Record [id number] Patient Account Number: [id number] Date of Birth/Sex: Treating RN: 06-30-1945 (72 y.o. [name] Primary Care Steffani Dionisio: Christain [place] Other Clinician: Referring Ariatna Jester: Treating Maneh Sieben/Extender:Robson, Glendale Chard, Jama Flavors Weeks in Treatment: 9 Edema Assessment Assessed: [Left: No] [Right: No] Edema: [Left: Yes] [Right: Yes] Calf Left: Right: Point of Measurement: 29 cm From Medial Instep 32.9 cm 28.7 cm Ankle Left: Right: Point of Measurement: 9 cm From Medial Instep 22.7 cm 21.2 cm Vascular Assessment Pulses: Dorsalis Pedis Palpable: [Left:Yes] [Right:Yes] Electronic Signature(s) Signed: 04/11/2019 5:21:50  PM By: Kela Millin Entered By: Kela Millin on 04/11/2019 15:31:16 -------------------------------------------------------------------------------- Multi Wound Chart Details Patient Name: Date of Service: [name] 04/11/2019 2:30 PM Medical Record [id number] Patient Account Number: [id number] Date of Birth/Sex: Treating RN: Dec 07, 1945 (73 y.o. [name] Primary Care Maddeline Roorda: Christain [place] Other Clinician: Referring Onnika Siebel: Treating Subrena Devereux/Extender:Robson, Glendale Chard, Letta Moynahan in Treatment: 9 Vital Signs Height(in): 1 Pulse(bpm): 67 Weight(lbs): 112 Blood Pressure(mmHg): 149/69 Body Mass Index(BMI): 20 Temperature(F): 98.6 Respiratory 19 Rate(breaths/min): Photos: [1:No Photos] [2:No Photos] [4:No Photos] Wound Location: [1:Left Knee - Anterior] [2:Right Lower Leg - Anterior Right Lower Leg - Lateral] Wounding Event: [1:Trauma] [2:Gradually Appeared] [4:Gradually Appeared] Primary Etiology: [1:Abrasion] [2:Venous Leg Ulcer] [4:Venous Leg  Ulcer] Comorbid History: [1:Cataracts, Chronic Obstructive Pulmonary Disease (COPD), Hypertension, Peripheral Hypertension, Peripheral Hypertension, Peripheral Venous Disease] [2:Cataracts, Chronic Obstructive Pulmonary Disease (COPD), Venous Disease]  [4:Cataracts, Chronic Obstructive Pulmonary Disease (COPD), Venous Disease] Date Acquired: [1:01/10/2019] [2:01/28/2019] [4:03/31/2019] Weeks of Treatment: [1:9] [2:9] [4:0] Wound Status: [1:Open] [2:Open] [4:Open] Clustered Wound: [1:No] [2:Yes] [4:No] Measurements L x W x D 2.5x1.7x0.2 [2:1.5x1.7x0.1] [4:2.6x2x0.1] (cm) Area (cm) : [1:3.338] [2:2.003] [4:4.084] Volume (cm) : [1:0.668] [2:0.2] [4:0.408] % Reduction in Area: [1:74.70%] [2:-325.30%] [4:60.90%] % Reduction in Volume: 83.10% [2:-325.50%] [4:61.00%] Classification: [1:Full Thickness Without Exposed Support Structures Exposed Support Structures Exposed Support Structures] [2:Full Thickness Without] [4:Full Thickness Without] Exudate Amount: [1:Medium] [2:Medium] [4:Medium] Exudate Type: [1:Serosanguineous] [2:Serous] [4:Serous] Exudate Color: [1:red, brown] [2:amber] [4:amber] Wound Margin: [1:Well defined, not attached Flat and Intact] [4:Distinct, outline attached] Granulation Amount: [1:Medium (34-66%)] [2:Large (67-100%)] [4:Large (67-100%)] Granulation Quality: [1:Pink, Pale] [2:Red, Pink] [4:Pink, Pale] Necrotic Amount: [1:Medium (34-66%)] [2:None Present (0%)] [4:Small (1-33%)] Exposed Structures: [1:Fat Layer (Subcutaneous Fat Layer (Subcutaneous Fat Layer (Subcutaneous Tissue) Exposed: Yes Fascia: No Tendon: No Muscle: No Joint: No Bone: No] [2:Tissue) Exposed: Yes Fascia: No Tendon: No Muscle: No Joint: No Bone: No] [4:Tissue) Exposed: Yes  Fascia: No Tendon: No Muscle: No Joint: No Bone: No] Epithelialization: [1:Small (1-33%)] [2:None Compression Therapy] [4:None Compression Therapy] Treatment Notes Electronic Signature(s) Signed: 04/11/2019 5:48:14 PM By: Linton Ham MD Signed: 04/11/2019 6:04:10 PM By: Levan Hurst RN, BSN Entered By: Linton Ham on 04/11/2019 16:08:39 -------------------------------------------------------------------------------- Multi-Disciplinary Care Plan Details Patient Name: Date of Service: [name] 04/11/2019 2:30 PM Medical Record [id number] Patient Account Number: [id number] Date of Birth/Sex: Treating RN: Feb 04, 1946 (73 y.o. [name] Primary Care Jaileigh Weimer: Christain [place] Other Clinician: Referring Latonya Nelon: Treating Ishmail Mcmanamon/Extender:Robson, Glendale Chard, Letta Moynahan in Treatment: 9 Active Inactive Abuse / Safety / Falls / Self Care Management Nursing Diagnoses: History of Falls Potential for falls Goals: Patient will remain injury free related to falls Date Initiated: 02/04/2019 Target Resolution Date: 05/13/2019 Goal Status: Active Patient/caregiver will verbalize/demonstrate measures taken to prevent injury and/or falls Date Initiated: 02/04/2019 Date Inactivated: 03/10/2019 Target Resolution Date: 03/11/2019 Goal Status: Met Interventions: Assess fall risk on admission and as needed Assess: immobility, friction, shearing, incontinence upon admission and as needed Assess personal safety and home safety (as indicated) on admission and as needed Notes: Venous Leg Ulcer Nursing Diagnoses: Actual venous Insuffiency (use after diagnosis is confirmed) Knowledge deficit related to disease process and management Goals: Patient will maintain optimal edema control Date Initiated: 02/04/2019 Target Resolution Date: 05/13/2019 Goal Status: Active Patient/caregiver will verbalize understanding of disease process and disease management Date Initiated: 02/04/2019 Date Inactivated: 03/10/2019 Target Resolution Date: 03/11/2019 Goal Status: Met Interventions: Assess peripheral edema status every visit. Compression as ordered Provide education on venous insufficiency  Treatment  Activities: Therapeutic compression applied : 02/04/2019 Notes: Wound/Skin Impairment Nursing Diagnoses: Impaired tissue integrity Knowledge deficit related to ulceration/compromised skin integrity Goals: Patient/caregiver will verbalize understanding of skin care regimen Date Initiated: 02/04/2019 Target Resolution Date: 05/13/2019 Goal Status: Active Ulcer/skin breakdown will have a volume reduction of 30% by week 4 Date Initiated: 02/04/2019 Date Inactivated: 04/07/2019 Target Resolution Date: 03/11/2019 Unmet Reason: see wound Goal Status: Unmet measurements. Interventions: Assess patient/caregiver ability to obtain necessary supplies Assess patient/caregiver ability to perform ulcer/skin care regimen upon admission and as needed Assess ulceration(s) every visit Provide education on ulcer and skin care Treatment Activities: Skin care regimen initiated : 02/04/2019 Topical wound management initiated : 02/04/2019 Notes: Electronic Signature(s) Signed: 04/11/2019 6:04:10 PM By: Levan Hurst RN, BSN Entered By: Levan Hurst on 04/11/2019 15:49:43 -------------------------------------------------------------------------------- Pain Assessment Details Patient Name: Date of Service: [name]. 04/11/2019 2:30 PM Medical Record [id number] Patient Account Number: [id number] Date of Birth/Sex: Treating RN: 10/14/1945 (73 y.o. [name] Primary Care Gates Jividen: Christain [place] Other Clinician: Referring Maquita Sandoval: Treating Amair Shrout/Extender:Robson, Glendale Chard, Letta Moynahan in Treatment: 9 Active Problems Location of Pain Severity and Description of Pain Patient Has Paino No Site Locations Pain Management and Medication Current Pain Management: Electronic Signature(s) Signed: 04/11/2019 5:21:50 PM By: Kela Millin Entered By: Kela Millin on 04/11/2019  15:25:46 -------------------------------------------------------------------------------- Patient/Caregiver Education Details Patient Name: Date of Service: [name] 10/26/2020andnbsp2:30 PM Medical Record [id number] Patient Account Number: [id number] Date of Birth/Gender: 23-Apr-1946 (73 y.o. F) Treating RN: Levan Hurst Primary Care Physician: Christain [place] Other Clinician: Referring Physician: Treating Physician/Extender:Robson, Glendale Chard, Letta Moynahan in Treatment: 9 Education Assessment Education Provided To: Patient Education Topics Provided Wound/Skin Impairment: Methods: Explain/Verbal Responses: State content correctly [employer]) Signed: 04/11/2019 6:04:10 PM By: Levan Hurst RN, BSN Entered By: Levan Hurst on 04/11/2019 15:49:55 -------------------------------------------------------------------------------- Wound Assessment Details Patient Name: Date of Service: Myung, Pola F. 04/11/2019 2:30 PM Medical Record [id number] Patient Account Number: [id number] Date of Birth/Sex: Treating RN: 07/14/1945 (73 y.o. [name] Primary Care Harley Mccartney: Christain [place] Other Clinician: Referring Adaya Garmany: Treating Ceejay Kegley/Extender:Robson, Glendale Chard, Jama Flavors Weeks in Treatment: 9 Wound Status Wound Number: 1 Primary Abrasion Etiology: Wound Location: Left Knee - Anterior Wound Open Wounding Event: Trauma Status: Date Acquired: 01/10/2019 Comorbid Cataracts, Chronic Obstructive Pulmonary Weeks Of Treatment: 9 History: Disease (COPD), Hypertension, Peripheral Clustered Wound: No Venous Disease Wound Measurements Length: (cm) 2.5 % Reduct Width: (cm) 1.7 % Reduct Depth: (cm) 0.2 Epitheli Area: (cm) 3.338 Tunneli Volume: (cm) 0.668 Undermi Wound Description Full Thickness Without Exposed Support Foul Od Classification: Structures Slough/ Wound Well defined, not attached Margin: Exudate  Medium Amount: Exudate Serosanguineous Type: Exudate red, brown Color: Wound Bed Granulation Amount: Medium (34-66%) Granulation Quality: Pink, Pale Fascia Necrotic Amount: Medium (34-66%) Fat Lay Necrotic Quality: Adherent Slough Tendon Muscle Joint E Bone Ex or After Cleansing: No Fibrino Yes Exposed Structure Exposed: No er (Subcutaneous Tissue) Exposed: Yes Exposed: No Exposed: No xposed: No posed: No ion in Area: 74.7% ion in Volume: 83.1% alization: Small (1-33%) ng: No ning: No Treatment Notes Wound #1 (Left, Anterior Knee) 1. Cleanse With Wound Cleanser 2. Periwound Care Skin Prep 3. Primary Dressing Applied Calcium Alginate Ag 4. Secondary Dressing Foam Border Dressing 5. Secured With Self Adhesive Bandage Notes netting. Electronic Signature(s) Signed: 04/11/2019 5:21:50 PM By: Kela Millin Entered By: Kela Millin on 04/11/2019 15:33:36 -------------------------------------------------------------------------------- Wound Assessment Details Patient Name: Date of Service: Kofman, Alonie F. 04/11/2019 2:30 PM Medical  Record [id number] Patient Account Number: [id number] Date of Birth/Sex: Treating RN: [birth year] (73 y.o. [name] Primary Care Orlena Garmon: Christain [place] Other Clinician: Referring Saryiah Bencosme: Treating Bre Pecina/Extender:Robson, Glendale Chard, Jama Flavors Weeks in Treatment: 9 Wound Status Wound Number: 2 Primary Venous Leg Ulcer Etiology: Wound Location: Right Lower Leg - Anterior Wound Open Wounding Event: Gradually Appeared Status: Date Acquired: 01/28/2019 Comorbid Cataracts, Chronic Obstructive Pulmonary Weeks Of Treatment: 9 History: Disease (COPD), Hypertension, Peripheral Clustered Wound: Yes Venous Disease Wound Measurements Length: (cm) 1.5 % Reduct Width: (cm) 1.7 % Reduct Depth: (cm) 0.1 Epitheli Area: (cm) 2.003 Tunneli Volume: (cm) 0.2 Undermi Wound Description Full Thickness Without  Exposed Support Foul Od Classification: Structures Slough/ Wound Flat and Intact Margin: Exudate Medium Amount: Exudate Serous Type: Exudate amber Color: Wound Bed Granulation Amount: Large (67-100%) Granulation Quality: Red, Pink Fascia Necrotic Amount: None Present (0%) Fat Lay Tendon Muscle Joint E Bone Ex or After Cleansing: No Fibrino No Exposed Structure Exposed: No er (Subcutaneous Tissue) Exposed: Yes Exposed: No Exposed: No xposed: No posed: No ion in Area: -325.3% ion in Volume: -325.5% alization: None ng: No ning: No Treatment Notes Wound #2 (Right, Anterior Lower Leg) 1. Cleanse With Wound Cleanser Soap and water 3. Primary Dressing Applied Calcium Alginate Ag 4. Secondary Dressing ABD Pad Kerramax/Xtrasorb 6. Support Layer Applied 3 layer compression wrap Notes netting Electronic Signature(s) Signed: 04/11/2019 5:21:50 PM By: Kela Millin Entered By: Kela Millin on 04/11/2019 15:34:47 -------------------------------------------------------------------------------- Wound Assessment Details Patient Name: Date of Service: SAANVI, HAKALA 04/11/2019 2:30 PM Medical Record [id number] Patient Account Number: [id number] Date of Birth/Sex: Treating RN: Oct 19, 1945 (73 y.o. [name] Primary Care Roselynn Whitacre: Christain [place] Other Clinician: Referring Ellsie Violette: Treating Donyel Nester/Extender:Robson, Glendale Chard, Jama Flavors Weeks in Treatment: 9 Wound Status Wound Number: 4 Primary Venous Leg Ulcer Etiology: Wound Location: Right Lower Leg - Lateral Wound Open Wounding Event: Gradually Appeared Status: Date Acquired: 03/31/2019 Comorbid Cataracts, Chronic Obstructive Pulmonary Weeks Of Treatment: 0 History: Disease (COPD), Hypertension, Peripheral Clustered Wound: No Venous Disease Wound Measurements Length: (cm) 2.6 % Reduct Width: (cm) 2 % Reduct Depth: (cm) 0.1 Epitheli Area: (cm) 4.084 Tunneli Volume: (cm)  0.408 Undermi Wound Description Classification: Full Thickness Without Exposed Support Foul Od Structures Slough/ Wound Wound Distinct, outline attached Margin: Exudate Medium Amount: Exudate Serous Type: Exudate amber Color: Wound Bed Granulation Amount: Large (67-100%) Granulation Quality: Pink, Pale Fascia Ex Necrotic Amount: Small (1-33%) Fat Layer Necrotic Quality: Adherent Slough Tendon Ex Muscle Ex Joint Exp Bone Expo or After Cleansing: No Fibrino Yes Exposed Structure posed: No (Subcutaneous Tissue) Exposed: Yes posed: No posed: No osed: No sed: No ion in Area: 60.9% ion in Volume: 61% alization: None ng: No ning: No Treatment Notes Wound #4 (Right, Lateral Lower Leg) 1. Cleanse With Wound Cleanser Soap and water 3. Primary Dressing Applied Calcium Alginate Ag 4. Secondary Dressing ABD Pad Kerramax/Xtrasorb 6. Support Layer Applied 3 layer compression wrap Notes netting Electronic Signature(s) Signed: 04/11/2019 5:21:50 PM By: Kela Millin Entered By: Kela Millin on 04/11/2019 15:34:24 -------------------------------------------------------------------------------- Vitals Details Patient Name: Date of Service: Weatherall, Nikaya F. 04/11/2019 2:30 PM Medical Record [id number] Patient Account Number: [id number] Date of Birth/Sex: Treating RN: 1945/12/18 (73 y.o. [name] Primary Care Sabin Gibeault: Christain [place] Other Clinician: Referring Kendryck Lacroix: Treating Zailynn Brandel/Extender:Robson, Glendale Chard, Letta Moynahan in Treatment: 9 Vital Signs Time Taken: 13:24 Temperature (F): 98.6 Height (in): 62 Pulse (bpm): 98 Weight (lbs): 112 Respiratory Rate (breaths/min): 19 Body Mass Index (BMI): 20.5  Blood Pressure (mmHg): 149/69 Reference Range: 80 - 120 mg / dl Electronic Signature(s) Signed: 04/11/2019 5:21:50 PM By: Kela Millin Entered By: Kela Millin on 04/11/2019 15:25:39

## 2019-04-11 NOTE — Progress Notes (Signed)
[name], [name] ([id number]) Visit Report for 04/11/2019 HPI Details Patient Name: Date of Service: Crystal, [name] 04/11/2019 2:30 PM Medical Record [id number] Patient Account Number: [id number] Date of Birth/Sex: Treating RN: 06-27-1945 (73 y.o. [name] Primary Care Provider: Christain [place] Other Clinician: Referring Provider: Treating Provider/Extender:Jamecia Lerman, Glendale Chard, Letta Moynahan in Treatment: 9 History of Present Illness HPI Description: ADMISSION 02/04/2019 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 6 weeks ago she had a fall and suffered a contusion on her left anterior patella area. She saw her primary doctor she has been prescribed Silvadene cream and cephalexin on 8/7 for 1 week. The area is not progressing towards closure and she is here for our review of this. Incidentally we were able to also discover an area on the right anterior lower tibial area which is a small wound that we think may have been there for about 1 week. The patient saw Dr. early on 11/23/2018. He did not think the patient had any arterial issues based on triphasic waveforms with his hand-held Dopplers. She had venous reflux studies that showed abnormal reflux times in the common femoral vein, great saphenous vein in the distal thigh great saphenous vein at the knee and great saphenous vein at the mid calf similar findings on the left but he did not think that she was a candidate for ablation stating that she had mild reflux in the great saphenous vein proximally but no dilation. There was no DVT Past medical history includes COPD, pulmonary hypertension, coronary disease, hypertension, chronic lower extremity edema ABIs in our clinic were 1.02 on the right and 0.86 on the left 8/28; patient readmitted to the clinic last week. Traumatic wound on the left anterior patella in the setting of  fairly significant chronic venous changes. She also has a small area on the right anterior mid tibia area. 9/4; traumatic wound on the left anterior patella. We have been using Medihoney as the patient could not afford Santyl. This is really not doing improving things. We moved to Iodoflex today. Also unfortunately the area on the right anterior mid tibia is not healed 9/24; traumatic wound on the left anterior patella had a greenish drainage on the dressing. This is led to a culture being done of this area. I changed to Iodoflex last time this may have something to do with the color. The area on the right anterior tibia is actually larger. 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 This looks somewhat better. He has an area just below this on the left which is superficial. Finally she has the area on the right anterior pretibial area which looks better and more superficial. Extremely damaged skin on the bilateral lower extremities I think which is mostly secondary to longstanding lymphedema. She does not want the right leg wrapped largely because she is developing fluid above the wraps just below her knee and in the thigh. I am concerned about not wrapping it however she has made her wishes clear 10/8; traumatic wound on the left anterior patella is smaller. We have been using silver alginate. She has 2 closely opposed wounds in the right anterior mid tibia. Some swelling and erythema in this area but no tenderness we have been using silver alginate here as well. She will not allow compression wraps. She  has a compression stocking for the right lower leg 10/22; patient is not been here in 2 weeks. She has a injury on the left anterior patellar tendon area from a fall. She has broken down areas anteriorly on the right and today right lateral  from severe venous inflammation with secondary lymphedema. She has not previously agreed to compression wraps. She has juxta lite stockings although these were not applied properly today. I am not clear what she has been putting on the wounds. She complains of a lot of pain on the right lateral calf 10/26; severe bilateral chronic venous hypertension with inflammation/stasis dermatitis and lymphedema. She finally let us wrap her right leg last week and things look a lot better although I did give her empiric Keflex. A lot less swelling a lot less inflammation in the right leg. She has been using her juxta lite on the left. She has 2 small open areas on the right leg to remain we have been using silver alginate Electronic Signature(s) Signed: 04/11/2019 5:48:14 PM By: Linton Ham MD Entered By: Linton Ham on 04/11/2019 16:13:10 -------------------------------------------------------------------------------- Physical Exam Details Patient Name: Date of Service: [name], [name] 04/11/2019 2:30 PM Medical Record [id number] Patient Account Number: [id number] Date of Birth/Sex: Treating RN: December 05, 1945 (73 y.o. [name] Primary Care Provider: Christain [place] Other Clinician: Referring Provider: Treating Provider/Extender:Cayetano Mikita, Glendale Chard, Letta Moynahan in Treatment: 9 Constitutional Patient is hypertensive.. Pulse regular and within target range for patient.Marland Kitchen Respirations regular, non-labored and within target range.. Temperature is normal and within the target range for the patient.Marland Kitchen Appears in no distress. Eyes Conjunctivae clear. No discharge.no icterus. Respiratory work of breathing is normal. Cardiovascular Pedal pulses palpable and strong bilaterally.. Edema control on the right leg is a lot better. Integumentary (Hair, Skin) Changes of severe chronic venous insufficiency with secondary lymphedema. Notes Wound exam on the left patella. This looks  smaller. No mechanical debridement debrided with Anasept and gauze. On the right leg she has much better edema control. A lot less severe stasis. She has 2 small areas one on the right anterior and one on the right lateral. No open areas on the left leg. She is using her juxta lite stocking Electronic Signature(s) Signed: 04/11/2019 5:48:14 PM By: Linton Ham MD Entered By: Linton Ham on 04/11/2019 16:14:30 -------------------------------------------------------------------------------- Physician Orders Details Patient Name: Date of Service: Jablonski, Makara F. 04/11/2019 2:30 PM Medical Record [id number] Patient Account Number: [id number] Date of Birth/Sex: Treating RN: 08-30-1945 (73 y.o. [name] Primary Care Provider: Christain [place] Other Clinician: Referring Provider: Treating Provider/Extender:Ac Colan, [place]endale Chard, Letta Moynahan in Treatment: 9 Verbal / Phone Orders: No Diagnosis Coding ICD-10 Coding Code Description S80.02XD Contusion of left knee, subsequent encounter L97.811 Non-pressure chronic ulcer of other part of right lower leg limited to breakdown of skin I87.323 Chronic venous hypertension (idiopathic) with inflammation of bilateral lower extremity I89.0 Lymphedema, not elsewhere classified Follow-up Appointments Return Appointment in 1 week. - Monday or Tuesday Dressing Change Frequency Wound #1 Left,Anterior Knee Change Dressing every other day. Wound #2 Right,Anterior Lower Leg Do not change entire dressing for one week. Wound #4 Right,Lateral Lower Leg Do not change entire dressing for one week. Skin Barriers/Peri-Wound Care Moisturizing lotion - patient to apply lotion to left leg at night. TCA Cream or Ointment - liberally with lotion to both legs in clinic. Wound Cleansing Wound #1 Left,Anterior Knee May shower and wash wound with soap and water. Wound #2 Right,Anterior Lower Leg May shower with  protection. Wound #4  Right,Lateral Lower Leg May shower with protection. Primary Wound Dressing Wound #1 Left,Anterior Knee Calcium Alginate with Silver Wound #2 Right,Anterior Lower Leg Calcium Alginate with Silver Wound #4 Right,Lateral Lower Leg Calcium Alginate with Silver Secondary Dressing Wound #1 Left,Anterior Knee Foam Border Wound #2 Right,Anterior Lower Leg ABD pad Kerramax - or zetvuit. Wound #4 Right,Lateral Lower Leg ABD pad Kerramax - or zetvuit. Edema Control 3 Layer Compression System - Right Lower Extremity Avoid standing for long periods of time Elevate legs to the level of the heart or above for 30 minutes daily and/or when sitting, a frequency of: - throughout the day Exercise regularly Support Garment 30-40 mm/Hg pressure to: - patient to wear juxtalite HD on left leg. Apply in the am and remove at night. Patient to ensure to measure how tight the compression is daily. Please show patient how to apply juxtalite. Electronic Signature(s) Signed: 04/11/2019 5:48:14 PM By: Linton Ham MD Signed: 04/11/2019 6:04:10 PM By: Levan Hurst RN, BSN Entered By: Levan Hurst on 04/11/2019 15:48:55 -------------------------------------------------------------------------------- Problem List Details Patient Name: Date of Service: Huneycutt, Malya F. 04/11/2019 2:30 PM Medical Record [id number] Patient Account Number: [id number] Date of Birth/Sex: Treating RN: Mar 20, 1946 (73 y.o. [name] Primary Care Provider: Christain [place] Other Clinician: Referring Provider: Treating Provider/Extender:Deleah Tison, Glendale Chard, Letta Moynahan in Treatment: 9 Active Problems ICD-10 Evaluated Encounter Code Description Active Date Today Diagnosis S80.02XD Contusion of left knee, subsequent encounter 02/04/2019 No Yes L97.811 Non-pressure chronic ulcer of other part of right lower 03/24/2019 No Yes leg limited to breakdown of skin I87.323 Chronic venous hypertension (idiopathic)  with 02/04/2019 No Yes inflammation of bilateral lower extremity I89.0 Lymphedema, not elsewhere classified 02/04/2019 No Yes Inactive Problems ICD-10 Code Description Active Date Inactive Date L97.511 Non-pressure chronic ulcer of other part of right foot limited to 02/11/2019 02/11/2019 breakdown of skin Resolved Problems Electronic Signature(s) Signed: 04/11/2019 5:48:14 PM By: Linton Ham MD Entered By: Linton Ham on 04/11/2019 16:08:32 -------------------------------------------------------------------------------- Progress Note Details Patient Name: Date of Service: [name] 04/11/2019 2:30 PM Medical Record [id number] Patient Account Number: [id number] Date of Birth/Sex: Treating RN: [birth year] (73 y.o. [name] Primary Care Provider: Christain [place] Other Clinician: Referring Provider: Treating Provider/Extender:Kye Silverstein, Glendale Chard, Jama Flavors Weeks in Treatment: 9 Subjective History of Present Illness (HPI) ADMISSION 02/04/2019 Mrs. 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 6 weeks ago she had a fall and suffered a contusion on her left anterior patella area. She saw her primary doctor she has been prescribed Silvadene cream and cephalexin on 8/7 for 1 week. The area is not progressing towards closure and she is here for our review of this. Incidentally we were able to also discover an area on the right anterior lower tibial area which is a small wound that we think may have been there for about 1 week. The patient saw Dr. early on 11/23/2018. He did not think the patient had any arterial issues based on triphasic waveforms with his hand-held Dopplers. She had venous reflux studies that showed abnormal reflux times in the common femoral vein, great saphenous vein in the distal thigh great saphenous vein at the knee and great saphenous vein at the mid calf similar findings  on the left but he did not think that she was a candidate for ablation stating that she had mild reflux in the great saphenous vein proximally but no dilation.  There was no DVT Past medical history includes COPD, pulmonary hypertension, coronary disease, hypertension, chronic lower extremity edema ABIs in our clinic were 1.02 on the right and 0.86 on the left 8/28; patient readmitted to the clinic last week. Traumatic wound on the left anterior patella in the setting of fairly significant chronic venous changes. She also has a small area on the right anterior mid tibia area. 9/4; traumatic wound on the left anterior patella. We have been using Medihoney as the patient could not afford Santyl. This is really not doing improving things. We moved to Iodoflex today. Also unfortunately the area on the right anterior mid tibia is not healed 9/24; traumatic wound on the left anterior patella had a greenish drainage on the dressing. This is led to a culture being done of this area. I changed to Iodoflex last time this may have something to do with the color. The area on the right anterior tibia is actually larger. 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 This looks somewhat better. He has an area just below this on the left which is superficial. Finally she has the area on the right anterior pretibial area which looks better and more superficial. Extremely damaged skin on the bilateral lower extremities I think which is mostly secondary to longstanding lymphedema. She does not want the right leg wrapped largely because she is developing fluid above the wraps just below her knee and in the thigh. I am concerned about not wrapping it however she has made her wishes clear 10/8; traumatic wound on the left anterior patella is smaller. We have been  using silver alginate. She has 2 closely opposed wounds in the right anterior mid tibia. Some swelling and erythema in this area but no tenderness we have been using silver alginate here as well. She will not allow compression wraps. She has a compression stocking for the right lower leg 10/22; patient is not been here in 2 weeks. She has a injury on the left anterior patellar tendon area from a fall. She has broken down areas anteriorly on the right and today right lateral from severe venous inflammation with secondary lymphedema. She has not previously agreed to compression wraps. She has juxta lite stockings although these were not applied properly today. I am not clear what she has been putting on the wounds. She complains of a lot of pain on the right lateral calf 10/26; severe bilateral chronic venous hypertension with inflammation/stasis dermatitis and lymphedema. She finally let us wrap her right leg last week and things look a lot better although I did give her empiric Keflex. A lot less swelling a lot less inflammation in the right leg. She has been using her juxta lite on the left. She has 2 small open areas on the right leg to remain we have been using silver alginate Objective Constitutional Patient is hypertensive.. Pulse regular and within target range for patient.Marland Kitchen Respirations regular, non-labored and within target range.. Temperature is normal and within the target range for the patient.Marland Kitchen Appears in no distress. Vitals Time Taken: 1:24 PM, Height: 62 in, Weight: 112 lbs, BMI: 20.5, Temperature: 98.6 F, Pulse: 98 bpm, Respiratory Rate: 19 breaths/min, Blood Pressure: 149/69 mmHg. Eyes Conjunctivae clear. No discharge.no icterus. Respiratory work of breathing is normal. Cardiovascular Pedal pulses palpable and strong bilaterally.. Edema control on the right leg is a lot better. General Notes: Wound  exam on the left patella. This looks smaller. No mechanical debridement  debrided with Anasept and gauze. ooOn the right leg she has much better edema control. A lot less severe stasis. She has 2 small areas one on the right anterior and one on the right lateral. ooNo open areas on the left leg. She is using her juxta lite stocking Integumentary (Hair, Skin) Changes of severe chronic venous insufficiency with secondary lymphedema. Wound #1 status is Open. Original cause of wound was Trauma. The wound is located on the Left,Anterior Knee. The wound measures 2.5cm length x 1.7cm width x 0.2cm depth; 3.338cm^2 area and 0.668cm^3 volume. There is Fat Layer (Subcutaneous Tissue) Exposed exposed. There is no tunneling or undermining noted. There is a medium amount of serosanguineous drainage noted. The wound margin is well defined and not attached to the wound base. There is medium (34-66%) pink, pale granulation within the wound bed. There is a medium (34-66%) amount of necrotic tissue within the wound bed including Adherent Slough. Wound #2 status is Open. Original cause of wound was Gradually Appeared. The wound is located on the Right,Anterior Lower Leg. The wound measures 1.5cm length x 1.7cm width x 0.1cm depth; 2.003cm^2 area and 0.2cm^3 volume. There is Fat Layer (Subcutaneous Tissue) Exposed exposed. There is no tunneling or undermining noted. There is a medium amount of serous drainage noted. The wound margin is flat and intact. There is large (67- 100%) red, pink granulation within the wound bed. There is no necrotic tissue within the wound bed. Wound #4 status is Open. Original cause of wound was Gradually Appeared. The wound is located on the Right,Lateral Lower Leg. The wound measures 2.6cm length x 2cm width x 0.1cm depth; 4.084cm^2 area and 0.408cm^3 volume. There is Fat Layer (Subcutaneous Tissue) Exposed exposed. There is no tunneling or undermining noted. There is a medium amount of serous drainage noted. The wound margin is distinct with the outline  attached to the wound base. There is large (67-100%) pink, pale granulation within the wound bed. There is a small (1-33%) amount of necrotic tissue within the wound bed including Adherent Slough. Assessment Active Problems ICD-10 Contusion of left knee, subsequent encounter Non-pressure chronic ulcer of other part of right lower leg limited to breakdown of skin Chronic venous hypertension (idiopathic) with inflammation of bilateral lower extremity Lymphedema, not elsewhere classified Procedures Wound #2 Pre-procedure diagnosis of Wound #2 is a Venous Leg Ulcer located on the Right,Anterior Lower Leg . There was a Three Layer Compression Therapy Procedure by Levan Hurst, RN. Post procedure Diagnosis Wound #2: Same as Pre-Procedure Wound #4 Pre-procedure diagnosis of Wound #4 is a Venous Leg Ulcer located on the Right,Lateral Lower Leg . There was a Three Layer Compression Therapy Procedure by Levan Hurst, RN. Post procedure Diagnosis Wound #4: Same as Pre-Procedure Plan Follow-up Appointments: Return Appointment in 1 week. - Monday or Tuesday Dressing Change Frequency: Wound #1 Left,Anterior Knee: Change Dressing every other day. Wound #2 Right,Anterior Lower Leg: Do not change entire dressing for one week. Wound #4 Right,Lateral Lower Leg: Do not change entire dressing for one week. Skin Barriers/Peri-Wound Care: Moisturizing lotion - patient to apply lotion to left leg at night. TCA Cream or Ointment - liberally with lotion to both legs in clinic. Wound Cleansing: Wound #1 Left,Anterior Knee: May shower and wash wound with soap and water. Wound #2 Right,Anterior Lower Leg: May shower with protection. Wound #4 Right,Lateral Lower Leg: May shower with protection. Primary Wound Dressing: Wound #1 Left,Anterior Knee:  Calcium Alginate with Silver Wound #2 Right,Anterior Lower Leg: Calcium Alginate with Silver Wound #4 Right,Lateral Lower Leg: Calcium Alginate with  Silver Secondary Dressing: Wound #1 Left,Anterior Knee: Foam Border Wound #2 Right,Anterior Lower Leg: ABD pad Kerramax - or zetvuit. Wound #4 Right,Lateral Lower Leg: ABD pad Kerramax - or zetvuit. Edema Control: 3 Layer Compression System - Right Lower Extremity Avoid standing for long periods of time Elevate legs to the level of the heart or above for 30 minutes daily and/or when sitting, a frequency of: - throughout the day Exercise regularly Support Garment 30-40 mm/Hg pressure to: - patient to wear juxtalite HD on left leg. Apply in the am and remove at night. Patient to ensure to measure how tight the compression is daily. Please show patient how to apply juxtalite. 1. Continue with silver alginate to the 2 remaining areas right anterior and and right lateral. 2. Liberal TCA/ABDs under 3 layer compression 3. Juxta lite stockings to the left leg 4. Everything looked a lot better today. No further antibiotics required although I told her to complete her Keflex. Started this for the severe erythema in the right leg last week although I think most of this was probably stasis dermatitis Electronic Signature(s) Signed: 04/11/2019 5:48:14 PM By: Linton Ham MD Entered By: Linton Ham on 04/11/2019 16:15:51 -------------------------------------------------------------------------------- SuperBill Details Patient Name: Date of Service: Ramires, Malina F. 04/11/2019 Medical Record [id number] Patient Account Number: [id number] Date of Birth/Sex: Treating RN: Apr 07, 1946 (73 y.o. [name] Primary Care Provider: Christain [place] Other Clinician: Referring Provider: Treating Provider/Extender:[place] Mullett, Glendale Chard, Jama Flavors Weeks in Treatment: 9 Diagnosis Coding ICD-10 Codes Code Description S80.02XD Contusion of left knee, subsequent encounter L97.811 Non-pressure chronic ulcer of other part of right lower leg limited to breakdown of skin I87.323 Chronic venous  hypertension (idiopathic) with inflammation of bilateral lower extremity I89.0 Lymphedema, not elsewhere classified Facility Procedures The patient participates with Medicare or their insurance follows the Medicare Facility Guidelines: CPT4 Code Description Modifier Quantity [id number] (Facility Use Only) [phone] - [place] RT LEG 1 Physician Procedures CPT4 Code Description: [id number] - WC PHYS LEVEL 3 - EST PT ICD-10 Diagnosis Description L97.811 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

## 2019-04-18 ENCOUNTER — Encounter (HOSPITAL_BASED_OUTPATIENT_CLINIC_OR_DEPARTMENT_OTHER): Payer: Medicare Other | Attending: Internal Medicine | Admitting: Internal Medicine

## 2019-04-18 ENCOUNTER — Other Ambulatory Visit: Payer: Self-pay

## 2019-04-18 DIAGNOSIS — J449 Chronic obstructive pulmonary disease, unspecified: Secondary | ICD-10-CM | POA: Diagnosis not present

## 2019-04-18 DIAGNOSIS — L97811 Non-pressure chronic ulcer of other part of right lower leg limited to breakdown of skin: Secondary | ICD-10-CM | POA: Insufficient documentation

## 2019-04-18 DIAGNOSIS — X58XXXD Exposure to other specified factors, subsequent encounter: Secondary | ICD-10-CM | POA: Diagnosis not present

## 2019-04-18 DIAGNOSIS — I87323 Chronic venous hypertension (idiopathic) with inflammation of bilateral lower extremity: Secondary | ICD-10-CM | POA: Insufficient documentation

## 2019-04-18 DIAGNOSIS — I890 Lymphedema, not elsewhere classified: Secondary | ICD-10-CM | POA: Diagnosis not present

## 2019-04-18 DIAGNOSIS — S8002XD Contusion of left knee, subsequent encounter: Secondary | ICD-10-CM | POA: Diagnosis not present

## 2019-04-18 DIAGNOSIS — Z9981 Dependence on supplemental oxygen: Secondary | ICD-10-CM | POA: Diagnosis not present

## 2019-04-18 NOTE — Progress Notes (Signed)
JACALYNN, BUZZELL ([id number]) Visit Report for 04/18/2019 HPI Details Patient Name: Date of Service: DAHLILA, PFAHLER 04/18/2019 2:00 PM Medical Record [id number] Patient Account Number: [id number] Date of Birth/Sex: Treating RN: Nov 22, 1945 (73 y.o. [name] Primary Care Provider: Christain Sacramento Other Clinician: Referring Provider: Treating Provider/Extender:Miciah Covelli, Glendale Chard, Letta Moynahan in Treatment: 10 History of Present Illness HPI Description: ADMISSION 02/04/2019 Mrs. 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 6 weeks ago she had a fall and suffered a contusion on her left anterior patella area. She saw her primary doctor she has been prescribed Silvadene cream and cephalexin on 8/7 for 1 week. The area is not progressing towards closure and she is here for our review of this. Incidentally we were able to also discover an area on the right anterior lower tibial area which is a small wound that we think may have been there for about 1 week. The patient saw Dr. early on 11/23/2018. He did not think the patient had any arterial issues based on triphasic waveforms with his hand-held Dopplers. She had venous reflux studies that showed abnormal reflux times in the common femoral vein, great saphenous vein in the distal thigh great saphenous vein at the knee and great saphenous vein at the mid calf similar findings on the left but he did not think that she was a candidate for ablation stating that she had mild reflux in the great saphenous vein proximally but no dilation. There was no DVT Past medical history includes COPD, pulmonary hypertension, coronary disease, hypertension, chronic lower extremity edema ABIs in our clinic were 1.02 on the right and 0.86 on the left 8/28; patient readmitted to the clinic last week. Traumatic wound on the left anterior patella in the setting of  fairly significant chronic venous changes. She also has a small area on the right anterior mid tibia area. 9/4; traumatic wound on the left anterior patella. We have been using Medihoney as the patient could not afford Santyl. This is really not doing improving things. We moved to Iodoflex today. Also unfortunately the area on the right anterior mid tibia is not healed 9/24; traumatic wound on the left anterior patella had a greenish drainage on the dressing. This is led to a culture being done of this area. I changed to Iodoflex last time this may have something to do with the color. The area on the right anterior tibia is actually larger. 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 This looks somewhat better. He has an area just below this on the left which is superficial. Finally she has the area on the right anterior pretibial area which looks better and more superficial. Extremely damaged skin on the bilateral lower extremities I think which is mostly secondary to longstanding lymphedema. She does not want the right leg wrapped largely because she is developing fluid above the wraps just below her knee and in the thigh. I am concerned about not wrapping it however she has made her wishes clear 10/8; traumatic wound on the left anterior patella is smaller. We have been using silver alginate. She has 2 closely opposed wounds in the right anterior mid tibia. Some swelling and erythema in this area but no tenderness we have been using silver alginate here as well. She will not allow compression wraps. She  has a compression stocking for the right lower leg 10/22; patient is not been here in 2 weeks. She has a injury on the left anterior patellar tendon area from a fall. She has broken down areas anteriorly on the right and today right lateral  from severe venous inflammation with secondary lymphedema. She has not previously agreed to compression wraps. She has juxta lite stockings although these were not applied properly today. I am not clear what she has been putting on the wounds. She complains of a lot of pain on the right lateral calf 10/26; severe bilateral chronic venous hypertension with inflammation/stasis dermatitis and lymphedema. She finally let us wrap her right leg last week and things look a lot better although I did give her empiric Keflex. A lot less swelling a lot less inflammation in the right leg. She has been using her juxta lite on the left. She has 2 small open areas on the right leg to remain we have been using silver alginate 11/2; severe bilateral chronic venous hypertension with inflammation and stasis dermatitis and lymphedema. The skin on her right leg actually looks better. Still a small open area anteriorly. On the left she has the inner part of her juxta lite stocking the edema was not well proportioned. She was cautioned to use the external wrap portion of this as well. The area on the left is on the anterior patella. This looks like it is closing down some using silver Electronic Signature(s) Signed: 04/18/2019 5:57:33 PM By: Linton Ham MD Entered By: Linton Ham on 04/18/2019 17:36:41 -------------------------------------------------------------------------------- Physical Exam Details Patient Name: Date of Service: [name] 04/18/2019 2:00 PM Medical Record [id number] Patient Account Number: [id number] Date of Birth/Sex: Treating RN: Jul 16, 1945 (73 y.o. [name] Primary Care Provider: Christain [place] Other Clinician: Referring Provider: Treating Provider/Extender:Jordi Lacko, Glendale Chard, Letta Moynahan in Treatment: 10 Constitutional Patient is hypertensive.. Pulse regular and within target range for patient.Marland Kitchen Respirations regular, non-labored and within target  range.. Temperature is normal and within the target range for the patient.Marland Kitchen Appears in no distress. Cardiovascular Pedal pulses are palpable. We have excellent edema control on the left. Not so good on the right where she did not apply the external part of her juxta lite. Notes Wound exam on the left patella. This is still open but smaller looking somewhat dry but I continued with the silver alginate for this week. May consider collagen next week Electronic Signature(s) Signed: 04/18/2019 5:57:33 PM By: Linton Ham MD Entered By: Linton Ham on 04/18/2019 17:38:15 -------------------------------------------------------------------------------- Physician Orders Details Patient Name: Date of Service: [name] 04/18/2019 2:00 PM Medical Record [id number] Patient Account Number: [id number] Date of Birth/Sex: Treating RN: September 11, 1945 (73 y.o. [name] Primary Care Provider: Christain [place] Other Clinician: Referring Provider: Treating Provider/Extender:Gretel Cantu, Glendale Chard, Letta Moynahan in Treatment: 10 Verbal / Phone Orders: No Diagnosis Coding ICD-10 Coding Code Description S80.02XD Contusion of left knee, subsequent encounter L97.811 Non-pressure chronic ulcer of other part of right lower leg limited to breakdown of skin I87.323 Chronic venous hypertension (idiopathic) with inflammation of bilateral lower extremity I89.0 Lymphedema, not elsewhere classified Follow-up Appointments Return Appointment in 1 week. Dressing Change Frequency Wound #1 Left,Anterior Knee Change Dressing every other day. Wound #2 Right,Anterior Lower Leg Do not change entire dressing for one week. Wound #5 Right,Proximal,Lateral Lower Leg Do not change entire dressing for one week. Skin Barriers/Peri-Wound Care Moisturizing lotion - patient to apply lotion to left leg at night. TCA Cream  or Ointment - liberally with lotion to both legs in clinic. Wound Cleansing Wound #1  Left,Anterior Knee May shower and wash wound with soap and water. Wound #2 Right,Anterior Lower Leg May shower with protection. Primary Wound Dressing Wound #1 Left,Anterior Knee Calcium Alginate with Silver Wound #2 Right,Anterior Lower Leg Calcium Alginate with Silver Wound #5 Right,Proximal,Lateral Lower Leg Foam Secondary Dressing Wound #1 Left,Anterior Knee Foam Border Wound #2 Right,Anterior Lower Leg ABD pad Kerramax - or zetvuit. Edema Control 3 Layer Compression System - Right Lower Extremity Avoid standing for long periods of time Elevate legs to the level of the heart or above for 30 minutes daily and/or when sitting, a frequency of: - throughout the day Exercise regularly Support Garment 30-40 mm/Hg pressure to: - patient to wear juxtalite HD on left leg. Apply in the am and remove at night. Patient to ensure to measure how tight the compression is daily. Please show patient how to apply juxtalite. Electronic Signature(s) Signed: 04/18/2019 5:57:33 PM By: Linton Ham MD Signed: 04/18/2019 6:00:40 PM By: Levan Hurst RN, BSN Entered By: Levan Hurst on 04/18/2019 15:46:39 -------------------------------------------------------------------------------- Problem List Details Patient Name: Date of Service: [name] 04/18/2019 2:00 PM Medical Record [id number] Patient Account Number: [id number] Date of Birth/Sex: Treating RN: [birth year] (73 y.o. [name] Primary Care Provider: Christain [place] Other Clinician: Referring Provider: Treating Provider/Extender:Vee Bahe, Glendale Chard, Letta Moynahan in Treatment: 10 Active Problems ICD-10 Evaluated Encounter Code Description Active Date Today Diagnosis S80.02XD Contusion of left knee, subsequent encounter 02/04/2019 No Yes L97.811 Non-pressure chronic ulcer of other part of right lower 03/24/2019 No Yes leg limited to breakdown of skin I87.323 Chronic venous hypertension (idiopathic) with  02/04/2019 No Yes inflammation of bilateral lower extremity I89.0 Lymphedema, not elsewhere classified 02/04/2019 No Yes Inactive Problems ICD-10 Code Description Active Date Inactive Date L97.511 Non-pressure chronic ulcer of other part of right foot limited to 02/11/2019 02/11/2019 breakdown of skin Resolved Problems Electronic Signature(s) Signed: 04/18/2019 5:57:33 PM By: Linton Ham MD Entered By: Linton Ham on 04/18/2019 17:35:28 -------------------------------------------------------------------------------- Progress Note Details Patient Name: Date of Service: [name] 04/18/2019 2:00 PM Medical Record [id number] Patient Account Number: [id number] Date of Birth/Sex: Treating RN: 1945-11-04 (73 y.o. [name] Primary Care Provider: Christain [place] Other Clinician: Referring Provider: Treating Provider/Extender:Stafford Riviera, Glendale Chard, Jama Flavors Weeks in Treatment: 10 Subjective History of Present Illness (HPI) ADMISSION 02/04/2019 Mrs. 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 6 weeks ago she had a fall and suffered a contusion on her left anterior patella area. She saw her primary doctor she has been prescribed Silvadene cream and cephalexin on 8/7 for 1 week. The area is not progressing towards closure and she is here for our review of this. Incidentally we were able to also discover an area on the right anterior lower tibial area which is a small wound that we think may have been there for about 1 week. The patient saw Dr. early on 11/23/2018. He did not think the patient had any arterial issues based on triphasic waveforms with his hand-held Dopplers. She had venous reflux studies that showed abnormal reflux times in the common femoral vein, great saphenous vein in the distal thigh great saphenous vein at the knee and great saphenous vein at the mid calf similar findings on the  left but he did not think that she was a candidate for ablation stating that she had  mild reflux in the great saphenous vein proximally but no dilation. There was no DVT Past medical history includes COPD, pulmonary hypertension, coronary disease, hypertension, chronic lower extremity edema ABIs in our clinic were 1.02 on the right and 0.86 on the left 8/28; patient readmitted to the clinic last week. Traumatic wound on the left anterior patella in the setting of fairly significant chronic venous changes. She also has a small area on the right anterior mid tibia area. 9/4; traumatic wound on the left anterior patella. We have been using Medihoney as the patient could not afford Santyl. This is really not doing improving things. We moved to Iodoflex today. Also unfortunately the area on the right anterior mid tibia is not healed 9/24; traumatic wound on the left anterior patella had a greenish drainage on the dressing. This is led to a culture being done of this area. I changed to Iodoflex last time this may have something to do with the color. The area on the right anterior tibia is actually larger. 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 This looks somewhat better. He has an area just below this on the left which is superficial. Finally she has the area on the right anterior pretibial area which looks better and more superficial. Extremely damaged skin on the bilateral lower extremities I think which is mostly secondary to longstanding lymphedema. She does not want the right leg wrapped largely because she is developing fluid above the wraps just below her knee and in the thigh. I am concerned about not wrapping it however she has made her wishes clear 10/8; traumatic wound on the left anterior patella is smaller. We have been using  silver alginate. She has 2 closely opposed wounds in the right anterior mid tibia. Some swelling and erythema in this area but no tenderness we have been using silver alginate here as well. She will not allow compression wraps. She has a compression stocking for the right lower leg 10/22; patient is not been here in 2 weeks. She has a injury on the left anterior patellar tendon area from a fall. She has broken down areas anteriorly on the right and today right lateral from severe venous inflammation with secondary lymphedema. She has not previously agreed to compression wraps. She has juxta lite stockings although these were not applied properly today. I am not clear what she has been putting on the wounds. She complains of a lot of pain on the right lateral calf 10/26; severe bilateral chronic venous hypertension with inflammation/stasis dermatitis and lymphedema. She finally let us wrap her right leg last week and things look a lot better although I did give her empiric Keflex. A lot less swelling a lot less inflammation in the right leg. She has been using her juxta lite on the left. She has 2 small open areas on the right leg to remain we have been using silver alginate 11/2; severe bilateral chronic venous hypertension with inflammation and stasis dermatitis and lymphedema. The skin on her right leg actually looks better. Still a small open area anteriorly. ooOn the left she has the inner part of her juxta lite stocking the edema was not well proportioned. She was cautioned to use the external wrap portion of this as well. The area on the left is on the anterior patella. This looks like it is closing down some using silver Objective Constitutional Patient  is hypertensive.. Pulse regular and within target range for patient.Marland Kitchen Respirations regular, non-labored and within target range.. Temperature is normal and within the target range for the patient.Marland Kitchen Appears in no distress. Vitals Time  Taken: 2:41 PM, Height: 62 in, Weight: 112 lbs, BMI: 20.5, Temperature: 98.6 F, Pulse: 91 bpm, Respiratory Rate: 19 breaths/min, Blood Pressure: 151/77 mmHg. Cardiovascular Pedal pulses are palpable. We have excellent edema control on the left. Not so good on the right where she did not apply the external part of her juxta lite. General Notes: Wound exam on the left patella. This is still open but smaller looking somewhat dry but I continued with the silver alginate for this week. May consider collagen next week Integumentary (Hair, Skin) Wound #1 status is Open. Original cause of wound was Trauma. The wound is located on the Left,Anterior Knee. The wound measures 2.5cm length x 1.2cm width x 0.1cm depth; 2.356cm^2 area and 0.236cm^3 volume. There is Fat Layer (Subcutaneous Tissue) Exposed exposed. There is no tunneling or undermining noted. There is a medium amount of serosanguineous drainage noted. The wound margin is well defined and not attached to the wound base. There is medium (34-66%) pink, pale granulation within the wound bed. There is a medium (34-66%) amount of necrotic tissue within the wound bed including Adherent Slough. Wound #2 status is Open. Original cause of wound was Gradually Appeared. The wound is located on the Right,Anterior Lower Leg. The wound measures 1.2cm length x 1.5cm width x 0.1cm depth; 1.414cm^2 area and 0.141cm^3 volume. There is Fat Layer (Subcutaneous Tissue) Exposed exposed. There is no tunneling or undermining noted. There is a medium amount of serosanguineous drainage noted. The wound margin is flat and intact. There is large (67-100%) red, pink granulation within the wound bed. There is no necrotic tissue within the wound bed. Wound #4 status is Healed - Epithelialized. Original cause of wound was Gradually Appeared. The wound is located on the Right,Lateral Lower Leg. The wound measures 0cm length x 0cm width x 0cm depth; 0cm^2 area and 0cm^3 volume.  There is no tunneling or undermining noted. There is a none present amount of drainage noted. The wound margin is distinct with the outline attached to the wound base. There is no granulation within the wound bed. There is no necrotic tissue within the wound bed. Wound #5 status is Open. Original cause of wound was Blister. The wound is located on the Right,Proximal,Lateral Lower Leg. The wound measures 1.5cm length x 2.5cm width x 0.1cm depth; 2.945cm^2 area and 0.295cm^3 volume. There is no tunneling or undermining noted. There is a small amount of serosanguineous drainage noted. Assessment Active Problems ICD-10 Contusion of left knee, subsequent encounter Non-pressure chronic ulcer of other part of right lower leg limited to breakdown of skin Chronic venous hypertension (idiopathic) with inflammation of bilateral lower extremity Lymphedema, not elsewhere classified Procedures Wound #2 Pre-procedure diagnosis of Wound #2 is a Venous Leg Ulcer located on the Right,Anterior Lower Leg . There was a Three Layer Compression Therapy Procedure by Levan Hurst, RN. 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 There was a Three Layer Compression Therapy Procedure by Levan Hurst, RN. Post procedure Diagnosis Wound #5: Same as Pre-Procedure Plan Follow-up Appointments: Return Appointment in 1 week. Dressing Change Frequency: Wound #1 Left,Anterior Knee: Change Dressing every other day. Wound #2 Right,Anterior Lower Leg: Do not change entire dressing for  one week. Wound #5 Right,Proximal,Lateral Lower Leg: Do not change entire dressing for one week. Skin Barriers/Peri-Wound Care: Moisturizing lotion - patient to apply lotion to left leg at night. TCA Cream or Ointment - liberally with lotion to both legs in clinic. Wound Cleansing: Wound #1 Left,Anterior  Knee: May shower and wash wound with soap and water. Wound #2 Right,Anterior Lower Leg: May shower with protection. Primary Wound Dressing: Wound #1 Left,Anterior Knee: Calcium Alginate with Silver Wound #2 Right,Anterior Lower Leg: Calcium Alginate with Silver Wound #5 Right,Proximal,Lateral Lower Leg: Foam Secondary Dressing: Wound #1 Left,Anterior Knee: Foam Border Wound #2 Right,Anterior Lower Leg: ABD pad Kerramax - or zetvuit. Edema Control: 3 Layer Compression System - Right Lower Extremity Avoid standing for long periods of time Elevate legs to the level of the heart or above for 30 minutes daily and/or when sitting, a frequency of: - throughout the day Exercise regularly Support Garment 30-40 mm/Hg pressure to: - patient to wear juxtalite HD on left leg. Apply in the am and remove at night. Patient to ensure to measure how tight the compression is daily. Please show patient how to apply juxtalite. 1. Right lateral lower leg is healed. The area on the anterior is superficial and I hope close next week. The entire skin on her right lower extremity looks a lot better with compression 2. The area on the left anterior patella is slightly dry but smaller. I have continued with the silver alginate consider changing to [facility] Blue or silver collagen next week. 3. She did not have the juxta leg correctly positioned on the left using only the tight anterior part of the wrap. As a result she had quite a bit of edema in the left lower extremity Electronic Signature(s) Signed: 04/18/2019 5:57:33 PM By: Linton Ham MD Entered By: Linton Ham on 04/18/2019 17:39:41 -------------------------------------------------------------------------------- SuperBill Details Patient Name: Date of Service: [employer], Hila F. 04/18/2019 Medical Record [id number] Patient Account Number: [id number] Date of Birth/Sex: Treating RN: [birth year] (73 y.o. [name] Primary Care  Provider: Christain [place] Other Clinician: Referring Provider: Treating Provider/Extender:Demontray Franta, Glend[place] Chard, Jama Flavors Weeks in Treatment: 10 Diagnosis Coding ICD-10 Codes Code Description S80.02XD Contusion of left knee, subsequent encounter L97.811 Non-pressure chronic ulcer of other part of right lower leg limited to breakdown of skin I87.323 Chronic venous hypertension (idiopathic) with inflammation of bilateral lower extremity I89.0 Lymphedema, not elsewhere classified Facility Procedures The patient participates with Medicare or their insurance follows the Medicare Facility Guidelines: CPT4 Code Description Modifier Quantity [phone] (Facility Use Only) [phone] - [place] L[facility] RT LEG 1 Physician Procedures CPT4 Code Description: [id number] - WC PHYS LEVEL 3 - EST PT ICD-10 Diagnosis Description L97.811 Non-pressure chronic ulcer of other part of right lower leg skin S80.02XD Contusion of left knee, subsequent encounter I89.0 Lymphedema, not elsewhere  classified Modifier: limited to breakdow Quantity: 1 n of Electronic Signature(s) Signed: 04/18/2019 5:57:33 PM By: Linton Ham MD Entered By: Linton Ham on 04/18/2019 17:40:06

## 2019-04-25 ENCOUNTER — Other Ambulatory Visit: Payer: Self-pay

## 2019-04-25 ENCOUNTER — Encounter (HOSPITAL_BASED_OUTPATIENT_CLINIC_OR_DEPARTMENT_OTHER): Payer: Medicare Other | Admitting: Internal Medicine

## 2019-04-25 DIAGNOSIS — L97811 Non-pressure chronic ulcer of other part of right lower leg limited to breakdown of skin: Secondary | ICD-10-CM | POA: Diagnosis not present

## 2019-04-25 NOTE — Progress Notes (Signed)
[name], [name] ([id number]) Visit Report for 04/25/2019 Debridement Details Patient Name: Date of Service: [name], [name] 04/25/2019 2:30 PM Medical Record [id number] Patient Account Number: [id number] Date of Birth/Sex: Treating RN: 1945/07/02 (73 y.o. [name] Primary Care Provider: Christain [name] Other Clinician: Referring Provider: Treating Provider/Extender:[name], [name], [name] in Treatment: 11 Debridement Performed for Wound #5 Right,Proximal,Lateral Lower Leg Assessment: Performed By: Physician [name]., MD Debridement Type: Debridement Severity of Tissue Pre Fat layer exposed Debridement: Level of Consciousness (Pre- Awake and Alert procedure): Pre-procedure Verification/Time Out Taken: Yes - 15:17 Start Time: 15:17 Total Area Debrided (L x W): 1.5 (cm) x 2 (cm) = 3 (cm) Tissue and other material Viable, Non-Viable, Eschar, Subcutaneous debrided: Level: Skin/Subcutaneous Tissue Debridement Description: Excisional Instrument: Curette Bleeding: Minimum Hemostasis Achieved: Pressure End Time: 15:18 Procedural Pain: 0 Post Procedural Pain: 0 Response to Treatment: Procedure was tolerated well Level of Consciousness Awake and Alert (Post-procedure): Post Debridement Measurements of Total Wound Length: (cm) 1.5 Width: (cm) 2 Depth: (cm) 0.1 Volume: (cm) 0.236 Character of Wound/Ulcer Post Improved Debridement: Severity of Tissue Post Debridement: Fat layer exposed Post Procedure Diagnosis Same as Pre-procedure Electronic Signature(s) Signed: 04/25/2019 5:25:49 PM By: Crystal Ham MD Signed: 04/25/2019 5:46:53 PM By: Crystal Hurst RN, BSN Entered By: [name] on 04/25/2019 17:06:17 -------------------------------------------------------------------------------- HPI Details Patient Name: Date of Service: Crystal Fray F. 04/25/2019 2:30 PM Medical Record [id number] Patient Account Number:  [id number] Date of Birth/Sex: Treating RN: July 06, 1945 (73 y.o. 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 6 weeks ago she had a fall and suffered a contusion on her left anterior patella area. She saw her primary doctor she has been prescribed Silvadene cream and cephalexin on 8/7 for 1 week. The area is not progressing towards closure and she is here for our review of this. Incidentally we were able to also discover an area on the right anterior lower tibial area which is a small wound that we think may have been there for about 1 week. The patient saw Dr. early on 11/23/2018. He did not think the patient had any arterial issues based on triphasic waveforms with his hand-held Dopplers. She had venous reflux studies that showed abnormal reflux times in the common femoral vein, great saphenous vein in the distal thigh great saphenous vein at the knee and great saphenous vein at the mid calf similar findings on the left but he did not think that she was a candidate for ablation stating that she had mild reflux in the great saphenous vein proximally but no dilation. There was no DVT Past medical history includes COPD, pulmonary hypertension, coronary disease, hypertension, chronic lower extremity edema ABIs in our clinic were 1.02 on the right and 0.86 on the left 8/28; patient readmitted to the clinic last week. Traumatic wound on the left anterior patella in the setting of fairly significant chronic venous changes. She also has a small area on the right anterior mid tibia area. 9/4; traumatic wound on the left anterior patella. We have been using Medihoney as the patient could not  afford Santyl. This is really not doing improving things. We moved to Iodoflex today. Also unfortunately the area on the right anterior mid tibia is not  healed 9/24; traumatic wound on the left anterior patella had a greenish drainage on the dressing. This is led to a culture being done of this area. I changed to Iodoflex last time this may have something to do with the color. The area on the right anterior tibia is actually larger. 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 This looks somewhat better. He has an area just below this on the left which is superficial. Finally she has the area on the right anterior pretibial area which looks better and more superficial. Extremely damaged skin on the bilateral lower extremities I think which is mostly secondary to longstanding lymphedema. She does not want the right leg wrapped largely because she is developing fluid above the wraps just below her knee and in the thigh. I am concerned about not wrapping it however she has made her wishes clear 10/8; traumatic wound on the left anterior patella is smaller. We have been using silver alginate. She has 2 closely opposed wounds in the right anterior mid tibia. Some swelling and erythema in this area but no tenderness we have been using silver alginate here as well. She will not allow compression wraps. She has a compression stocking for the right lower leg 10/22; patient is not been here in 2 weeks. She has a injury on the left anterior patellar tendon area from a fall. She has broken down areas anteriorly on the right and today right lateral from severe venous inflammation with secondary lymphedema. She has not previously agreed to compression wraps. She has juxta lite stockings although these were not applied properly today. I am not clear what she  has been putting on the wounds. She complains of a lot of pain on the right lateral calf 10/26; severe bilateral chronic venous hypertension with inflammation/stasis dermatitis and lymphedema. She finally let us wrap her right leg last week and things look a lot better although I did give her empiric Keflex. A lot less swelling a lot less inflammation in the right leg. She has been using her juxta lite on the left. She has 2 small open areas on the right leg to remain we have been using silver alginate 11/2; severe bilateral chronic venous hypertension with inflammation and stasis dermatitis and lymphedema. The skin on her right leg actually looks better. Still a small open area anteriorly. On the left she has the inner part of her juxta lite stocking the edema was not well proportioned. She was cautioned to use the external wrap portion of this as well. The area on the left is on the anterior patella. This looks like it is closing down some using silver 11/9; severe bilateral chronic venous hypertension with inflammation stasis dermatitis and lymphedema. Her edema control is better the degree of inflammation in her skin is better. She has remaining wounds on the right mid tibia, new area on the right lateral upper tibia and then the original area on her knee. We have been using silver alginate however the wounds look dry I have changed to silver collagen today. Electronic Signature(s) Signed: 04/25/2019 5:25:49 PM By: Crystal Ham MD Entered By: [name] on 04/25/2019 17:07:17 -------------------------------------------------------------------------------- Physical Exam Details Patient Name: Date of Service: [name], [name] 04/25/2019 2:30 PM Medical Record [id number] Patient Account Number: [id number] Date of Birth/Sex: Treating RN: March 07, 1946 (73 y.o. [name] Primary Care Provider: Christain [name] Other Clinician:  Referring Provider: Treating  Provider/Extender:Daiden Coltrane, [name], Jama Flavors Weeks in Treatment: 11 Constitutional Patient is hypertensive.. Pulse regular and within target range for patient.Marland Kitchen Respirations regular, non-labored and within target range.. Temperature is normal and within the target range for the patient.Marland Kitchen Appears in no distress. Notes Wound exam Left anterior patella. Some of this is closed the wound looks dry I am changing to moistened silver collagen Right lateral upper lower extremity debrided with a #5 curette to get a healthy surface. Right mid tibial wound looks healthy Electronic Signature(s) Signed: 04/25/2019 5:25:49 PM By: Crystal Ham MD Entered By: [name] on 04/25/2019 17:08:38 -------------------------------------------------------------------------------- Physician Orders Details Patient Name: Date of Service: [name], [name] 04/25/2019 2:30 PM Medical Record [id number] Patient Account Number: [id number] Date of Birth/Sex: Treating RN: September 10, 1945 (73 y.o. [name] Primary Care Provider: Christain [name] Other Clinician: Referring Provider: Treating Provider/Extender:Leili Eskenazi, [name], [name] in Treatment: 11 Verbal / Phone Orders: No Diagnosis Coding ICD-10 Coding Code Description S80.02XD Contusion of left knee, subsequent encounter L97.811 Non-pressure chronic ulcer of other part of right lower leg limited to breakdown of skin I87.323 Chronic venous hypertension (idiopathic) with inflammation of bilateral lower extremity I89.0 Lymphedema, not elsewhere classified Follow-up Appointments Return Appointment in 1 week. Dressing Change Frequency Wound #1 Left,Anterior Knee Change Dressing every other day. Wound #2 Right,Anterior Lower Leg Do not change entire dressing for one week. Wound #5 Right,Proximal,Lateral Lower Leg Do not change entire dressing for one week. Skin Barriers/Peri-Wound Care Moisturizing lotion - patient to apply lotion  to left leg at night. TCA Cream or Ointment - liberally with lotion to both legs in clinic. Wound Cleansing Wound #1 Left,Anterior Knee May shower and wash wound with soap and water. Wound #2 Right,Anterior Lower Leg May shower with protection. Primary Wound Dressing Wound #1 Left,Anterior Knee Collagen - moisten with hydrogel (or KY jelly) Wound #2 Right,Anterior Lower Leg Collagen - moisten with hydrogel (or KY jelly) Wound #5 Right,Proximal,Lateral Lower Leg Collagen - moisten with hydrogel (or KY jelly) Secondary Dressing Wound #1 Left,Anterior Knee Foam Border Wound #2 Right,Anterior Lower Leg ABD pad Wound #5 Right,Proximal,Lateral Lower Leg Foam Border Edema Control 3 Layer Compression System - Right Lower Extremity Avoid standing for long periods of time Elevate legs to the level of the heart or above for 30 minutes daily and/or when sitting, a frequency of: - throughout the day Exercise regularly Support Garment 30-40 mm/Hg pressure to: - patient to wear juxtalite HD on left leg. Apply in the am and remove at night. Patient to ensure to measure how tight the compression is daily. Please show patient how to apply juxtalite. Electronic Signature(s) Signed: 04/25/2019 5:25:49 PM By: Crystal Ham MD Signed: 04/25/2019 5:46:53 PM By: Crystal Hurst RN, BSN Entered By: [name] on 04/25/2019 15:20:11 -------------------------------------------------------------------------------- Problem List Details Patient Name: Date of Service: Crystal Barth. 04/25/2019 2:30 PM Medical Record [id number] Patient Account Number: [id number] Date of Birth/Sex: Treating RN: Aug 31, 1945 (72 y.o. [name] Primary Care Provider: Christain [name] Other Clinician: Referring Provider: Treating Provider/Extender:Shatyra Becka, [name], [name] in Treatment: 11 Active Problems ICD-10 Evaluated Encounter Code Description Active Date Today Diagnosis S80.02XD  Contusion of left knee, subsequent encounter 02/04/2019 No Yes L97.811 Non-pressure chronic ulcer of other part of right lower 03/24/2019 No Yes leg limited to breakdown of skin I87.323 Chronic venous hypertension (idiopathic) with 02/04/2019 No Yes inflammation of bilateral lower extremity I89.0 Lymphedema, not elsewhere classified 02/04/2019 No Yes Inactive Problems ICD-10  Code Description Active Date Inactive Date L97.511 Non-pressure chronic ulcer of other part of right foot limited to 02/11/2019 02/11/2019 breakdown of skin Resolved Problems Electronic Signature(s) Signed: 04/25/2019 5:25:49 PM By: Crystal Ham MD Entered By: [name] on 04/25/2019 17:05:28 -------------------------------------------------------------------------------- Progress Note Details Patient Name: Date of Service: Crystal Barth. 04/25/2019 2:30 PM Medical Record [id number] Patient Account Number: [id number] Date of Birth/Sex: Treating RN: 07-09-1945 (73 y.o. [name] Primary Care Provider: Christain [name] Other Clinician: Referring Provider: Treating Provider/Extender:Quadre Bristol, [name], [name] in Treatment: 11 Subjective History of Present Illness (HPI) ADMISSION 02/04/2019 Mrs. 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 6 weeks ago she had a fall and suffered a contusion on her left anterior patella area. She saw her primary doctor she has been prescribed Silvadene cream and cephalexin on 8/7 for 1 week. The area is not progressing towards closure and she is here for our review of this. Incidentally we were able to also discover an area on the right anterior lower tibial area which is a small wound that we think may have been there for about 1 week. The patient saw Dr. early on 11/23/2018. He did not think the patient had any arterial issues based on triphasic waveforms with his hand-held Dopplers.  She had venous reflux studies that showed abnormal reflux times in the common femoral vein, great saphenous vein in the distal thigh great saphenous vein at the knee and great saphenous vein at the mid calf similar findings on the left but he did not think that she was a candidate for ablation stating that she had mild reflux in the great saphenous vein proximally but no dilation. There was no DVT Past medical history includes COPD, pulmonary hypertension, coronary disease, hypertension, chronic lower extremity edema ABIs in our clinic were 1.02 on the right and 0.86 on the left 8/28; patient readmitted to the clinic last week. Traumatic wound on the left anterior patella in the setting of fairly significant chronic venous changes. She also has a small area on the right anterior mid tibia area. 9/4; traumatic wound on the left anterior patella. We have been using Medihoney as the patient could not afford Santyl. This is really not doing improving things. We moved to Iodoflex today. Also unfortunately the area on the right anterior mid tibia is not healed 9/24; traumatic wound on the left anterior patella had a greenish drainage on the dressing. This is led to a culture being done of this area. I changed to Iodoflex last time this may have something to do with the color. The area on the right anterior tibia is actually larger. 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 This looks somewhat better. He has an area just below this on the left which is superficial. Finally she has the area on the right anterior pretibial area which looks better and more superficial. Extremely damaged skin on the bilateral lower extremities I think which is mostly secondary to longstanding lymphedema. She does not want the right leg wrapped largely because she is  developing fluid above the wraps just below her knee and in the thigh. I am concerned about not wrapping it however she has made her wishes clear 10/8; traumatic wound on the left anterior patella is smaller. We have been using silver alginate. She  has 2 closely opposed wounds in the right anterior mid tibia. Some swelling and erythema in this area but no tenderness we have been using silver alginate here as well. She will not allow compression wraps. She has a compression stocking for the right lower leg 10/22; patient is not been here in 2 weeks. She has a injury on the left anterior patellar tendon area from a fall. She has broken down areas anteriorly on the right and today right lateral from severe venous inflammation with secondary lymphedema. She has not previously agreed to compression wraps. She has juxta lite stockings although these were not applied properly today. I am not clear what she has been putting on the wounds. She complains of a lot of pain on the right lateral calf 10/26; severe bilateral chronic venous hypertension with inflammation/stasis dermatitis and lymphedema. She finally let us wrap her right leg last week and things look a lot better although I did give her empiric Keflex. A lot less swelling a lot less inflammation in the right leg. She has been using her juxta lite on the left. She has 2 small open areas on the right leg to remain we have been using silver alginate 11/2; severe bilateral chronic venous hypertension with inflammation and stasis dermatitis and lymphedema. The skin on her right leg actually looks better. Still a small open area anteriorly. ooOn the left she has the inner part of her juxta lite stocking the edema was not well proportioned. She was cautioned to use the external wrap portion of this as well. The area on the left is on the anterior patella. This looks like it is closing down some using silver 11/9; severe bilateral chronic venous  hypertension with inflammation stasis dermatitis and lymphedema. Her edema control is better the degree of inflammation in her skin is better. She has remaining wounds on the right mid tibia, new area on the right lateral upper tibia and then the original area on her knee. We have been using silver alginate however the wounds look dry I have changed to silver collagen today. Objective Constitutional Patient is hypertensive.. Pulse regular and within target range for patient.Marland Kitchen Respirations regular, non-labored and within target range.. Temperature is normal and within the target range for the patient.Marland Kitchen Appears in no distress. Vitals Time Taken: 2:54 PM, Height: 62 in, Weight: 112 lbs, BMI: 20.5, Temperature: 98.9 F, Pulse: 103 bpm, Respiratory Rate: 18 breaths/min, Blood Pressure: 163/90 mmHg. General Notes: Wound exam ooLeft anterior patella. Some of this is closed the wound looks dry I am changing to moistened silver collagen ooRight lateral upper lower extremity debrided with a #5 curette to get a healthy surface. ooRight mid tibial wound looks healthy Integumentary (Hair, Skin) Wound #1 status is Open. Original cause of wound was Trauma. The wound is located on the Left,Anterior Knee. The wound measures 2.5cm length x 1.1cm width x 0.1cm depth; 2.16cm^2 area and 0.216cm^3 volume. There is Fat Layer (Subcutaneous Tissue) Exposed exposed. There is no tunneling or undermining noted. There is a medium amount of serosanguineous drainage noted. The wound margin is well defined and not attached to the wound base. There is medium (34-66%) pink, pale granulation within the wound bed. There is a medium (34-66%) amount of necrotic tissue within the wound bed including Adherent Slough. Wound #2 status is Open. Original cause of wound was Gradually Appeared. The wound is located on the Right,Anterior Lower Leg. The wound measures 1.5cm length x 1.5cm width x 0.1cm depth; 1.767cm^2 area and  0.177cm^3  volume. There is Fat Layer (Subcutaneous Tissue) Exposed exposed. There is no tunneling or undermining noted. There is a medium amount of serosanguineous drainage noted. The wound margin is flat and intact. There is large (67-100%) red, pink granulation within the wound bed. There is no necrotic tissue within the wound bed. Wound #5 status is Open. Original cause of wound was Blister. The wound is located on the Right,Proximal,Lateral Lower Leg. The wound measures 1.5cm length x 2cm width x 0.1cm depth; 2.356cm^2 area and 0.236cm^3 volume. There is Fat Layer (Subcutaneous Tissue) Exposed exposed. There is no tunneling or undermining noted. There is a small amount of serosanguineous drainage noted. There is large (67-100%) red, pink granulation within the wound bed. Assessment Active Problems ICD-10 Contusion of left knee, subsequent encounter Non-pressure chronic ulcer of other part of right lower leg limited to breakdown of skin Chronic venous hypertension (idiopathic) with inflammation of bilateral lower extremity Lymphedema, not elsewhere classified Procedures Wound #5 Pre-procedure diagnosis of Wound #5 is a Venous Leg Ulcer located on the Right,Proximal,Lateral Lower Leg .Severity of Tissue Pre Debridement is: Fat layer exposed. There was a Excisional Skin/Subcutaneous Tissue Debridement with a total area of 3 sq cm performed by [name]., MD. With the following instrument(s): Curette to remove Viable and Non-Viable tissue/material. Material removed includes Eschar and Subcutaneous Tissue and. No specimens were taken. A time out was conducted at 15:17, prior to the start of the procedure. A Minimum amount of bleeding was controlled with Pressure. The procedure was tolerated well with a pain level of 0 throughout and a pain level of 0 following the procedure. Post Debridement Measurements: 1.5cm length x 2cm width x 0.1cm depth; 0.236cm^3 volume. Character of Wound/Ulcer Post  Debridement is improved. Severity of Tissue Post Debridement is: Fat layer exposed. Post procedure Diagnosis Wound #5: Same as Pre-Procedure Wound #2 Pre-procedure diagnosis of Wound #2 is a Venous Leg Ulcer located on the Right,Anterior Lower Leg . There was a Three Layer Compression Therapy Procedure by Crystal Hurst, RN. Post procedure Diagnosis Wound #2: Same as Pre-Procedure Plan Follow-up Appointments: Return Appointment in 1 week. Dressing Change Frequency: Wound #1 Left,Anterior Knee: Change Dressing every other day. Wound #2 Right,Anterior Lower Leg: Do not change entire dressing for one week. Wound #5 Right,Proximal,Lateral Lower Leg: Do not change entire dressing for one week. Skin Barriers/Peri-Wound Care: Moisturizing lotion - patient to apply lotion to left leg at night. TCA Cream or Ointment - liberally with lotion to both legs in clinic. Wound Cleansing: Wound #1 Left,Anterior Knee: May shower and wash wound with soap and water. Wound #2 Right,Anterior Lower Leg: May shower with protection. Primary Wound Dressing: Wound #1 Left,Anterior Knee: Collagen - moisten with hydrogel (or KY jelly) Wound #2 Right,Anterior Lower Leg: Collagen - moisten with hydrogel (or KY jelly) Wound #5 Right,Proximal,Lateral Lower Leg: Collagen - moisten with hydrogel (or KY jelly) Secondary Dressing: Wound #1 Left,Anterior Knee: Foam Border Wound #2 Right,Anterior Lower Leg: ABD pad Wound #5 Right,Proximal,Lateral Lower Leg: Foam Border Edema Control: 3 Layer Compression System - Right Lower Extremity Avoid standing for long periods of time Elevate legs to the level of the heart or above for 30 minutes daily and/or when sitting, a frequency of: - throughout the day Exercise regularly Support Garment 30-40 mm/Hg pressure to: - patient to wear juxtalite HD on left leg. Apply in the am and remove at night. Patient to ensure to measure how tight the compression is daily. Please  show patient how  to apply juxtalite. 1. Moistened silver collagen to all wound areas 2. 3 layer compression on the right lower extremity Electronic Signature(s) Signed: 04/25/2019 5:25:49 PM By: Crystal Ham MD Entered By: [name] on 04/25/2019 17:09:18 -------------------------------------------------------------------------------- SuperBill Details Patient Name: Date of Service: Crystal Barth. 04/25/2019 Medical Record [id number] Patient Account Number: [id number] Date of Birth/Sex: Treating RN: November 18, 1945 (73 y.o. 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) with inflammation of bilateral lower extremity I89.0 Lymphedema, not elsewhere classified Facility Procedures The patient participates with Medicare or their insurance follows the Medicare Facility Guidelines: CPT4 Code Description Modifier Quantity [id number] 11042 - DEB SUBQ TISSUE 20 SQ CM/< 1 ICD-10 Diagnosis Description L97.811 Non-pressure chronic ulcer of  other part of right lower leg limited to breakdown of skin Physician Procedures CPT4 Code Description: [id number] 11042 - WC PHYS SUBQ TISS 20 SQ CM ICD-10 Diagnosis Description L97.811 Non-pressure chronic ulcer of other part of right lower leg skin Modifier: limited to breakdow Quantity: 1 n of Electronic Signature(s) Signed: 04/25/2019 5:25:49 PM By: Crystal Ham MD Entered By: [name] on 04/25/2019 17:09:42

## 2019-05-02 ENCOUNTER — Other Ambulatory Visit: Payer: Self-pay

## 2019-05-02 ENCOUNTER — Encounter (HOSPITAL_BASED_OUTPATIENT_CLINIC_OR_DEPARTMENT_OTHER): Payer: Medicare Other | Admitting: Internal Medicine

## 2019-05-02 DIAGNOSIS — L97811 Non-pressure chronic ulcer of other part of right lower leg limited to breakdown of skin: Secondary | ICD-10-CM | POA: Diagnosis not present

## 2019-05-02 NOTE — Progress Notes (Signed)
[name] ([id number]) Visit Report for 05/02/2019 HPI Details Patient Name: Date of Service: [name], [name] 05/02/2019 2:30 PM Medical Record [id number] Patient Account Number: [id number] Date of Birth/Sex: Treating RN: September 01, 1945 (73 y.o. [name] Primary Care Provider: Christain [place] Other Clinician: Referring Provider: Treating Provider/Extender:, [place]endale Chard, Letta Moynahan in Treatment: 12 History of Present Illness HPI Description: ADMISSION 02/04/2019 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 6 weeks ago she had a fall and suffered a contusion on her left anterior patella area. She saw her primary doctor she has been prescribed Silvadene cream and cephalexin on 8/7 for 1 week. The area is not progressing towards closure and she is here for our review of this. Incidentally we were able to also discover an area on the right anterior lower tibial area which is a small wound that we think may have been there for about 1 week. The patient saw Dr. early on 11/23/2018. He did not think the patient had any arterial issues based on triphasic waveforms with his hand-held Dopplers. She had venous reflux studies that showed abnormal reflux times in the common femoral vein, great saphenous vein in the distal thigh great saphenous vein at the knee and great saphenous vein at the mid calf similar findings on the left but he did not think that she was a candidate for ablation stating that she had mild reflux in the great saphenous vein proximally but no dilation. There was no DVT Past medical history includes COPD, pulmonary hypertension, coronary disease, hypertension, chronic lower extremity edema ABIs in our clinic were 1.02 on the right and 0.86 on the left 8/28; patient readmitted to the clinic last week. Traumatic wound on the left anterior patella in the setting of  fairly significant chronic venous changes. She also has a small area on the right anterior mid tibia area. 9/4; traumatic wound on the left anterior patella. We have been using Medihoney as the patient could not afford Santyl. This is really not doing improving things. We moved to Iodoflex today. Also unfortunately the area on the right anterior mid tibia is not healed 9/24; traumatic wound on the left anterior patella had a greenish drainage on the dressing. This is led to a culture being done of this area. I changed to Iodoflex last time this may have something to do with the color. The area on the right anterior tibia is actually larger. 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 This looks somewhat better. He has an area just below this on the left which is superficial. Finally she has the area on the right anterior pretibial area which looks better and more superficial. Extremely damaged skin on the bilateral lower extremities I think which is mostly secondary to longstanding lymphedema. She does not want the right leg wrapped largely because she is developing fluid above the wraps just below her knee and in the thigh. I am concerned about not wrapping it however she has made her wishes clear 10/8; traumatic wound on the left anterior patella is smaller. We have been using silver alginate. She has 2 closely opposed wounds in the right anterior mid tibia. Some swelling and erythema in this area but no tenderness we have been using silver alginate here as well. She will not allow compression wraps. She  has a compression stocking for the right lower leg 10/22; patient is not been here in 2 weeks. She has a injury on the left anterior patellar tendon area from a fall. She has broken down areas anteriorly on the right and today right lateral  from severe venous inflammation with secondary lymphedema. She has not previously agreed to compression wraps. She has juxta lite stockings although these were not applied properly today. I am not clear what she has been putting on the wounds. She complains of a lot of pain on the right lateral calf 10/26; severe bilateral chronic venous hypertension with inflammation/stasis dermatitis and lymphedema. She finally let us wrap her right leg last week and things look a lot better although I did give her empiric Keflex. A lot less swelling a lot less inflammation in the right leg. She has been using her juxta lite on the left. She has 2 small open areas on the right leg to remain we have been using silver alginate 11/2; severe bilateral chronic venous hypertension with inflammation and stasis dermatitis and lymphedema. The skin on her right leg actually looks better. Still a small open area anteriorly. On the left she has the inner part of her juxta lite stocking the edema was not well proportioned. She was cautioned to use the external wrap portion of this as well. The area on the left is on the anterior patella. This looks like it is closing down some using silver 11/9; severe bilateral chronic venous hypertension with inflammation stasis dermatitis and lymphedema. Her edema control is better the degree of inflammation in her skin is better. She has remaining wounds on the right mid tibia, new area on the right lateral upper tibia and then the original area on her knee. We have been using silver alginate however the wounds look dry I have changed to silver collagen today. 11/16; severe bilateral chronic venous hypertension with inflammation/stasis dermatitis and lymphedema. Her edema control is much better and the skin on her lower legs looks better. She has remaining wounds in the right mid tibia right lateral upper tibia and the original area on her left patella. Silver collagen to the wounds that  I started last week. Electronic Signature(s) Signed: 05/02/2019 5:46:17 PM By: Linton Ham MD Entered By: Linton Ham on 05/02/2019 16:42:00 -------------------------------------------------------------------------------- Physical Exam Details Patient Name: Date of Service: [name], [name] 05/02/2019 2:30 PM Medical Record [id number] Patient Account Number: [id number] Date of Birth/Sex: Treating RN: [birth year] (73 y.o. [name] Primary Care Provider: Christain [place] Other Clinician: Referring Provider: Treating Provider/Extender:, [place] Chard, Letta Moynahan in Treatment: 12 Constitutional Patient is hypertensive.. Pulse regular and within target range for patient.Marland Kitchen Respirations regular, non-labored and within target range.. Temperature is normal and within the target range for the patient.Marland Kitchen Appears in no distress. Respiratory Above normal respiratory effort noted. Respiratory rate elveaated. O2 1. Cardiovascular He will call for palpable. Notes Wound exam Left anterior patella. I think this is gradually closing in we have been using silver collagen On the left mid tibia area there is a raised hyperkeratotic area which may be a malignancy. She has multiple areas of hyperkeratotic nodules in her thighs. 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 2 areas right anterior mid tibia and right lateral upper tibia. These look healthy. Electronic Signature(s) Signed: 05/02/2019 5:46:17 PM By: Linton Ham MD Entered By: Linton Ham on 05/02/2019 16:45:13 -------------------------------------------------------------------------------- Physician Orders Details Patient  Name: Date of Service: [name], [name] 05/02/2019 2:30 PM Medical Record [id number] Patient Account Number: [id number] Date of Birth/Sex: Treating RN: 1945-09-08 (73 y.o. [name] Primary Care Provider: Christain [place] Other Clinician: Referring Provider: Treating Provider/Extender:, [place] Chard, Letta Moynahan in Treatment: 12 Verbal / Phone Orders: No Diagnosis Coding ICD-10 Coding Code Description S80.02XD Contusion of left knee, subsequent encounter L97.811 Non-pressure chronic ulcer of other part of right lower leg limited to breakdown of skin I87.323 Chronic venous hypertension (idiopathic) with inflammation of bilateral lower extremity I89.0 Lymphedema, not elsewhere classified Follow-up Appointments Return Appointment in 2 weeks. Nurse Visit: - 1 week for rewrap Dressing Change Frequency Wound #1 Left,Anterior Knee Change Dressing every other day. Wound #2 Right,Anterior Lower Leg Do not change entire dressing for one week. Wound #5 Right,Proximal,Lateral Lower Leg Do not change entire dressing for one week. Skin Barriers/Peri-Wound Care Moisturizing lotion - patient to apply lotion to left leg at night. TCA Cream or Ointment - liberally with lotion to both legs in clinic. Wound Cleansing Wound #1 Left,Anterior Knee May shower and wash wound with soap and water. Wound #2 Right,Anterior Lower Leg May shower with protection. Primary Wound Dressing Wound #1 Left,Anterior Knee Collagen - moisten with hydrogel (or KY jelly) Wound #2 Right,Anterior Lower Leg Collagen - moisten with hydrogel (or KY jelly) Wound #5 Right,Proximal,Lateral Lower Leg Collagen - moisten with hydrogel (or KY jelly) Secondary Dressing Wound #1 Left,Anterior Knee Foam Border Wound #2 Right,Anterior Lower Leg ABD pad Wound #5 Right,Proximal,Lateral Lower Leg Foam Border Edema Control 3 Layer Compression System - Right Lower Extremity Avoid standing for long periods of time Elevate legs to the level of the heart or above for 30 minutes daily and/or when sitting, a frequency of: - throughout the day Exercise regularly Support Garment 30-40 mm/Hg pressure to: - patient to wear juxtalite HD on left  leg. Apply in the am and remove at night. Patient to ensure to measure how tight the compression is daily. Please show patient how to apply juxtalite. Electronic Signature(s) Signed: 05/02/2019 5:46:17 PM By: Linton Ham MD Signed: 05/02/2019 5:59:11 PM By: Levan Hurst RN, BSN Entered By: Levan Hurst on 05/02/2019 16:05:03 -------------------------------------------------------------------------------- Problem List Details Patient Name: Date of Service: [name] 05/02/2019 2:30 PM Medical Record [id number] Patient Account Number: [id number] Date of Birth/Sex: Treating RN: Dec 19, 1945 (73 y.o. [name] Primary Care Provider: Christain [place] Other Clinician: Referring Provider: Treating Provider/Extender:, [place] Chard, Letta Moynahan in Treatment: 12 Active Problems ICD-10 Evaluated Encounter Code Description Active Date Today Diagnosis S80.02XD Contusion of left knee, subsequent encounter 02/04/2019 No Yes L97.811 Non-pressure chronic ulcer of other part of right lower 03/24/2019 No Yes leg limited to breakdown of skin I87.323 Chronic venous hypertension (idiopathic) with 02/04/2019 No Yes inflammation of bilateral lower extremity I89.0 Lymphedema, not elsewhere classified 02/04/2019 No Yes Inactive Problems ICD-10 Code Description Active Date Inactive Date L97.511 Non-pressure chronic ulcer of other part of right foot limited to 02/11/2019 02/11/2019 breakdown of skin Resolved Problems Electronic Signature(s) Signed: 05/02/2019 5:46:17 PM By: Linton Ham MD Entered By: Linton Ham on 05/02/2019 16:40:42 -------------------------------------------------------------------------------- Progress Note Details Patient Name: Date of Service: [name]. 05/02/2019 2:30 PM Medical Record [id number] Patient Account Number: [id number] Date of Birth/Sex: Treating RN: September 06, 1945 (73 y.o. [name] Primary Care Provider:  Christain [place] Other Clinician: Referring Provider: Treating Provider/Extender:, [place] Chard, Letta Moynahan in Treatment: 12 Subjective History of Present Illness (HPI) ADMISSION 02/04/2019 Mrs.  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 6 weeks ago she had a fall and suffered a contusion on her left anterior patella area. She saw her primary doctor she has been prescribed Silvadene cream and cephalexin on 8/7 for 1 week. The area is not progressing towards closure and she is here for our review of this. Incidentally we were able to also discover an area on the right anterior lower tibial area which is a small wound that we think may have been there for about 1 week. The patient saw Dr. early on 11/23/2018. He did not think the patient had any arterial issues based on triphasic waveforms with his hand-held Dopplers. She had venous reflux studies that showed abnormal reflux times in the common femoral vein, great saphenous vein in the distal thigh great saphenous vein at the knee and great saphenous vein at the mid calf similar findings on the left but he did not think that she was a candidate for ablation stating that she had mild reflux in the great saphenous vein proximally but no dilation. There was no DVT Past medical history includes COPD, pulmonary hypertension, coronary disease, hypertension, chronic lower extremity edema ABIs in our clinic were 1.02 on the right and 0.86 on the left 8/28; patient readmitted to the clinic last week. Traumatic wound on the left anterior patella in the setting of fairly significant chronic venous changes. She also has a small area on the right anterior mid tibia area. 9/4; traumatic wound on the left anterior patella. We have been using Medihoney as the patient could not afford Santyl. This is really not doing improving things. We moved to Iodoflex today. Also unfortunately  the area on the right anterior mid tibia is not healed 9/24; traumatic wound on the left anterior patella had a greenish drainage on the dressing. This is led to a culture being done of this area. I changed to Iodoflex last time this may have something to do with the color. The area on the right anterior tibia is actually larger. 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 This looks somewhat better. He has an area just below this on the left which is superficial. Finally she has the area on the right anterior pretibial area which looks better and more superficial. Extremely damaged skin on the bilateral lower extremities I think which is mostly secondary to longstanding lymphedema. She does not want the right leg wrapped largely because she is developing fluid above the wraps just below her knee and in the thigh. I am concerned about not wrapping it however she has made her wishes clear 10/8; traumatic wound on the left anterior patella is smaller. We have been using silver alginate. She has 2 closely opposed wounds in the right anterior mid tibia. Some swelling and erythema in this area but no tenderness we have been using silver alginate here as well. She will not allow compression wraps. She has a compression stocking for the right lower leg 10/22; patient is not been here in 2 weeks. She has a injury on the left anterior patellar tendon area from a fall. She has broken down areas anteriorly on the right and today right lateral from severe venous inflammation with secondary lymphedema. She has not previously agreed to compression wraps. She has juxta lite stockings although these were  not applied properly today. I am not clear what she has been putting on the wounds. She complains of a lot of pain on the right lateral calf 10/26; severe  bilateral chronic venous hypertension with inflammation/stasis dermatitis and lymphedema. She finally let us wrap her right leg last week and things look a lot better although I did give her empiric Keflex. A lot less swelling a lot less inflammation in the right leg. She has been using her juxta lite on the left. She has 2 small open areas on the right leg to remain we have been using silver alginate 11/2; severe bilateral chronic venous hypertension with inflammation and stasis dermatitis and lymphedema. The skin on her right leg actually looks better. Still a small open area anteriorly. ooOn the left she has the inner part of her juxta lite stocking the edema was not well proportioned. She was cautioned to use the external wrap portion of this as well. The area on the left is on the anterior patella. This looks like it is closing down some using silver 11/9; severe bilateral chronic venous hypertension with inflammation stasis dermatitis and lymphedema. Her edema control is better the degree of inflammation in her skin is better. She has remaining wounds on the right mid tibia, new area on the right lateral upper tibia and then the original area on her knee. We have been using silver alginate however the wounds look dry I have changed to silver collagen today. 11/16; severe bilateral chronic venous hypertension with inflammation/stasis dermatitis and lymphedema. Her edema control is much better and the skin on her lower legs looks better. She has remaining wounds in the right mid tibia right lateral upper tibia and the original area on her left patella. Silver collagen to the wounds that I started last week. Objective Constitutional Patient is hypertensive.. Pulse regular and within target range for patient.Marland Kitchen Respirations regular, non-labored and within target range.. Temperature is normal and within the target range for the patient.Marland Kitchen Appears in no distress. Vitals Time Taken: 3:23 PM,  Height: 62 in, Weight: 112 lbs, BMI: 20.5, Temperature: 99.3 F, Pulse: 118 bpm, Respiratory Rate: 20 breaths/min, Blood Pressure: 149/75 mmHg. Respiratory Above normal respiratory effort noted. Respiratory rate elveaated. O2 1. Cardiovascular He will call for palpable. General Notes: Wound exam ooLeft anterior patella. I think this is gradually closing in we have been using silver collagen ooOn the left mid tibia area there is a raised hyperkeratotic area which may be a malignancy. She has multiple areas of hyperkeratotic nodules in her thighs. 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 oo2 areas right anterior mid tibia and right lateral upper tibia. These look healthy. Integumentary (Hair, Skin) Wound #1 status is Open. Original cause of wound was Trauma. The wound is located on the Left,Anterior Knee. The wound measures 2.5cm length x 1.4cm width x 0.1cm depth; 2.749cm^2 area and 0.275cm^3 volume. There is Fat Layer (Subcutaneous Tissue) Exposed exposed. There is no tunneling or undermining noted. There is a medium amount of serosanguineous drainage noted. The wound margin is well defined and not attached to the wound base. There is medium (34-66%) pink, pale granulation within the wound bed. There is a medium (34-66%) amount of necrotic tissue within the wound bed including Adherent Slough. Wound #2 status is Open. Original cause of wound was Gradually Appeared. The wound is located on the Right,Anterior Lower Leg. The wound measures 1.5cm length x 1.5cm width  x 0.1cm depth; 1.767cm^2 area and 0.177cm^3 volume. There is Fat Layer (Subcutaneous Tissue) Exposed exposed. There is no tunneling or undermining noted. There is a medium amount of serosanguineous drainage noted. The wound margin is flat and intact. There is large (67-100%) red, pink granulation within the wound bed. There is no necrotic tissue within the wound bed. Wound #5  status is Open. Original cause of wound was Blister. The wound is located on the Right,Proximal,Lateral Lower Leg. The wound measures 1cm length x 1.2cm width x 0.1cm depth; 0.942cm^2 area and 0.094cm^3 volume. There is Fat Layer (Subcutaneous Tissue) Exposed exposed. There is no tunneling or undermining noted. There is a small amount of serosanguineous drainage noted. There is no granulation within the wound bed. There is a large (67-100%) amount of necrotic tissue within the wound bed including Adherent Slough. Assessment Active Problems ICD-10 Contusion of left knee, subsequent encounter Non-pressure chronic ulcer of other part of right lower leg limited to breakdown of skin Chronic venous hypertension (idiopathic) with inflammation of bilateral lower extremity Lymphedema, not elsewhere classified Procedures Wound #2 Pre-procedure diagnosis of Wound #2 is a Venous Leg Ulcer located on the Right,Anterior Lower Leg . There was a Three Layer Compression Therapy Procedure by Levan Hurst, RN. 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 There was a Three Layer Compression Therapy Procedure by Levan Hurst, RN. Post procedure Diagnosis Wound #5: Same as Pre-Procedure Plan Follow-up Appointments: Return Appointment in 2 weeks. Nurse Visit: - 1 week for rewrap Dressing Change Frequency: Wound #1 Left,Anterior Knee: Change Dressing every other day. Wound #2 Right,Anterior Lower Leg: Do not change entire dressing for one week. Wound #5 Right,Proximal,Lateral Lower Leg: Do not change entire dressing for one week. Skin Barriers/Peri-Wound Care: Moisturizing lotion - patient to apply lotion to left leg at night. TCA Cream or Ointment - liberally with lotion to both legs in clinic. Wound Cleansing: Wound #1 Left,Anterior Knee: May shower and wash wound with  soap and water. Wound #2 Right,Anterior Lower Leg: May shower with protection. Primary Wound Dressing: Wound #1 Left,Anterior Knee: Collagen - moisten with hydrogel (or KY jelly) Wound #2 Right,Anterior Lower Leg: Collagen - moisten with hydrogel (or KY jelly) Wound #5 Right,Proximal,Lateral Lower Leg: Collagen - moisten with hydrogel (or KY jelly) Secondary Dressing: Wound #1 Left,Anterior Knee: Foam Border Wound #2 Right,Anterior Lower Leg: ABD pad Wound #5 Right,Proximal,Lateral Lower Leg: Foam Border Edema Control: 3 Layer Compression System - Right Lower Extremity Avoid standing for long periods of time Elevate legs to the level of the heart or above for 30 minutes daily and/or when sitting, a frequency of: - throughout the day Exercise regularly Support Garment 30-40 mm/Hg pressure to: - patient to wear juxtalite HD on left leg. Apply in the am and remove at night. Patient to ensure to measure how tight the compression is daily. Please show patient how to apply juxtalite. 1. TCA liberally 2. Silver collagen to all wound areas 3. Border foam on the left patella 3 layer compression on the right leg 4. The patient has a fair number of had nodular hyperkeratotic areas on the thighs which may be nodular basal cells. The area on the left mid tibia was a bit worrisome today for malignancy as well. This made me wonder about the areas that were actually looking at on the right tibia. We will see how things go down the  road Engineer, maintenance) Signed: 05/02/2019 5:46:17 PM By: Linton Ham MD Entered By: Linton Ham on 05/02/2019 16:46:47 -------------------------------------------------------------------------------- SuperBill Details Patient Name: Date of Service: [name], [name] 05/02/2019 Medical Record [id number] Patient Account Number: [id number] Date of Birth/Sex: Treating RN: [birth year] (73 y.o. [name] Primary Care Provider: Christain [place] Other Clinician: Referring Provider: Treating Provider/Extender:, [place]endale Chard, Jama Flavors Weeks in Treatment: 12 Diagnosis Coding ICD-10 Codes Code Description S80.02XD Contusion of left knee, subsequent encounter L97.811 Non-pressure chronic ulcer of other part of right lower leg limited to breakdown of skin I87.323 Chronic venous hypertension (idiopathic) with inflammation of bilateral lower extremity I89.0 Lymphedema, not elsewhere classified Facility Procedures The patient participates with Medicare or their insurance follows the Medicare Facility Guidelines: CPT4 Code Description Modifier Quantity [id number] (Facility Use Only) [id number] - [place] RT LEG 1 Physician Procedures CPT4 Code Description: [id number] - WC PHYS LEVEL 3 - EST PT ICD-10 Diagnosis Description L97.811 Non-pressure chronic ulcer of other part of right lower le skin S80.02XD Contusion of left knee, subsequent encounter I89.0 Lymphedema, not elsewhere  classified Modifier: g limited to breakdo Quantity: 1 wn of Electronic Signature(s) Signed: 05/02/2019 5:46:17 PM By: Linton Ham MD Entered By: Linton Ham on 05/02/2019 16:47:28

## 2019-05-09 ENCOUNTER — Encounter (HOSPITAL_BASED_OUTPATIENT_CLINIC_OR_DEPARTMENT_OTHER): Payer: Medicare Other

## 2019-05-09 ENCOUNTER — Other Ambulatory Visit: Payer: Self-pay

## 2019-05-09 DIAGNOSIS — L97811 Non-pressure chronic ulcer of other part of right lower leg limited to breakdown of skin: Secondary | ICD-10-CM | POA: Diagnosis not present

## 2019-05-09 NOTE — Progress Notes (Signed)
[name], [name] ([id number]) Visit Report for 05/09/2019 Arrival Information Details Patient Name: Date of Service: [name], [name] 05/09/2019 1:00 PM Medical Record [id number] Patient Account Number: [id number] Date of Birth/Sex: Treating RN: 1945/07/29 (73 y.o. Helene Shoe, Meta.Reding Primary Care Twana Wileman: Christain [place] Other Clinician: Referring Viktoria Gruetzmacher: Treating Hannan Tetzlaff/Extender:Stone III, Alexander Bergeron, Jama Flavors Weeks in Treatment: 13 Visit Information History Since Last Visit Added or deleted any medications: No Patient Arrived: Wheel Chair Any new allergies or adverse reactions: No Arrival Time: 13:18 Had a fall or experienced change in No activities of daily living that may affect Accompanied By: friend risk of falls: Transfer Assistance: None Signs or symptoms of abuse/neglect since last No Patient Identification Verified: Yes visito Secondary Verification Process Yes Hospitalized since last visit: No Completed: Implantable device outside of the clinic excluding No Patient Requires Transmission-Based No cellular tissue based products placed in the center Precautions: since last visit: Patient Has Alerts: No Has Dressing in Place as Prescribed: Yes Has Compression in Place as Prescribed: Yes Pain Present Now: No Electronic Signature(s) Signed: 05/09/2019 2:18:02 PM By: Deon Pilling Entered By: Deon Pilling on 05/09/2019 13:19:08 -------------------------------------------------------------------------------- Compression Therapy Details Patient Name: Date of Service: [name], [name] 05/09/2019 1:00 PM Medical Record [id number] Patient Account Number: [id number] Date of Birth/Sex: Treating RN: [birth year] (73 y.o. [name] Primary Care Takari Lundahl: Christain [place] Other Clinician: Referring Loriana Samad: Treating Aviela Blundell/Extender:Stone III, Alexander Bergeron, Jama Flavors Weeks in Treatment: 13 Compression Therapy Performed for Wound Wound #2 Right,Anterior  Lower Leg Assessment: Performed By: Clinician Deon Pilling, RN Compression Type: Three Layer Electronic Signature(s) Signed: 05/09/2019 2:18:02 PM By: Deon Pilling Entered By: Deon Pilling on 05/09/2019 13:21:25 -------------------------------------------------------------------------------- Encounter Discharge Information Details Patient Name: Date of Service: Crystal Fray F. 05/09/2019 1:00 PM Medical Record [id number] Patient Account Number: [id number] Date of Birth/Sex: Treating RN: 05-20-1946 (73 y.o. Helene Shoe, Tammi Klippel Primary Care Johnavon Mcclafferty: Christain [place] Other Clinician: Referring Keahi Mccarney: Treating Savahna Casados/Extender:Stone III, Alexander Bergeron, Letta Moynahan in Treatment: 13 Encounter Discharge Information Items Discharge Condition: Stable Ambulatory Status: Wheelchair Discharge Destination: Home Transportation: Private Auto Accompanied By: friend Schedule Follow-up Appointment: Yes Clinical Summary of Care: Electronic Signature(s) Signed: 05/09/2019 2:18:02 PM By: Deon Pilling Entered By: Deon Pilling on 05/09/2019 13:23:07 -------------------------------------------------------------------------------- Patient/Caregiver Education Details Patient Name: Date of Service: [name], [name] 11/23/2020andnbsp1:00 PM Medical Record Patient Account Number: [id number] Number: Treating RN: Deon Pilling Date of Birth/Gender: [birth year] (73 y.o. F) Other Clinician: Primary Care Physician: Christain [place] Treating Worthy Keeler Referring Physician: Physician/Extender: Mindi Junker in Treatment: 13 Education Assessment Education Provided To: Patient Education Topics Provided Wound/Skin Impairment: Handouts: Caring for Your Ulcer Methods: Explain/Verbal Responses: Reinforcements needed Electronic Signature(s) Signed: 05/09/2019 2:18:02 PM By: Deon Pilling Entered By: Deon Pilling on 05/09/2019  13:22:56 -------------------------------------------------------------------------------- Wound Assessment Details Patient Name: Date of Service: [name], [name] 05/09/2019 1:00 PM Medical Record [id number] Patient Account Number: [id number] Date of Birth/Sex: Treating RN: [birth year] (73 y.o. Helene Shoe, Meta.Reding Primary Care Alexius Hangartner: Christain [place] Other Clinician: Referring Tayler Lassen: Treating Iya Hamed/Extender:Stone III, Alexander Bergeron, Jama Flavors Weeks in Treatment: 13 Wound Status Wound Number: 1 Primary Etiology: Abrasion Wound Location: Left, Anterior Knee Wound Status: Open Wounding Event: Trauma Date Acquired: 01/10/2019 Weeks Of Treatment: 13 Clustered Wound: No Wound Measurements Length: (cm) 2.5 % Reduc Width: (cm) 1.4 % Reduc Depth: (cm) 0.1 Area: (cm) 2.749 Volume: (cm) 0.275 Wound Description Classification: Full Thickness Without Exposed Support Structures tion in Area: 79.2% tion in Volume: 93.1%  Treatment Notes Wound #1 (Left, Anterior Knee) 1. Cleanse With Wound Cleanser 3. Primary Dressing Applied Collegen AG Hydrogel or K-Y Jelly 4. Secondary Dressing Foam Border Dressing 5. Secured With Office manager) Signed: 05/09/2019 2:18:02 PM By: Deon Pilling Entered By: Deon Pilling on 05/09/2019 13:21:09 -------------------------------------------------------------------------------- Wound Assessment Details Patient Name: Date of Service: [name], [name] 05/09/2019 1:00 PM Medical Record [id number] Patient Account Number: [id number] Date of Birth/Sex: Treating RN: 16-Jul-1945 (73 y.o. Helene Shoe, Meta.Reding Primary Care Aul Mangieri: Christain [place] Other Clinician: Referring Anaiyah Anglemyer: Treating Chrissie Dacquisto/Extender:Stone III, Alexander Bergeron, Jama Flavors Weeks in Treatment: 13 Wound Status Wound Number: 2 Primary Etiology: Venous Leg Ulcer Wound Location: Right, Anterior Lower Leg Wound Status: Open Wounding Event: Gradually  Appeared Date Acquired: 01/28/2019 Weeks Of Treatment: 13 Clustered Wound: Yes Wound Measurements Length: (cm) 1.5 % Reduc Width: (cm) 1.5 % Reduc Depth: (cm) 0.1 Area: (cm) 1.767 Volume: (cm) 0.177 Wound Description Classification: Full Thickness Without Exposed Support Structures tion in Area: -275.2% tion in Volume: -276.6% Treatment Notes Wound #2 (Right, Anterior Lower Leg) 1. Cleanse With Wound Cleanser Soap and water 2. Periwound Care Moisturizing lotion 3. Primary Dressing Applied Collegen AG Hydrogel or K-Y Jelly 4. Secondary Dressing ABD Pad 6. Support Layer Applied 3 layer compression wrap Notes stretch net Electronic Signature(s) Signed: 05/09/2019 2:18:02 PM By: Deon Pilling Entered By: Deon Pilling on 05/09/2019 13:21:09 -------------------------------------------------------------------------------- Wound Assessment Details Patient Name: Date of Service: [name], [name] 05/09/2019 1:00 PM Medical Record [id number] Patient Account Number: [id number] Date of Birth/Sex: Treating RN: [birth year] (73 y.o. Helene Shoe, Meta.Reding Primary Care Raheim Beutler: Christain [place] Other Clinician: Referring Helder Crisafulli: Treating Verita Kuroda/Extender:Stone III, Alexander Bergeron, Jama Flavors Weeks in Treatment: 13 Wound Status Wound Number: 5 Primary Etiology: Venous Leg Ulcer Wound Location: Right, Proximal, Lateral Lower Leg Wound Status: Open Wounding Event: Blister Date Acquired: 04/10/2019 Weeks Of Treatment: 3 Clustered Wound: No Wound Measurements Length: (cm) 1 % Reduc Width: (cm) 1.2 % Reduc Depth: (cm) 0.1 Area: (cm) 0.942 Volume: (cm) 0.094 Wound Description Full Thickness Without Exposed Support Classification: Structures tion in Area: 68% tion in Volume: 68.1% Treatment Notes Wound #5 (Right, Proximal, Lateral Lower Leg) 1. Cleanse With Wound Cleanser 3. Primary Dressing Applied Collegen AG Hydrogel or K-Y Jelly 4. Secondary Dressing Foam Border  Dressing 5. Secured With Office manager) Signed: 05/09/2019 2:18:02 PM By: Deon Pilling Entered By: Deon Pilling on 05/09/2019 13:21:09 -------------------------------------------------------------------------------- Vitals Details Patient Name: Date of Service: Crystal Fray F. 05/09/2019 1:00 PM Medical Record [id number] Patient Account Number: [id number] Date of Birth/Sex: Treating RN: Aug 19, 1945 (73 y.o. Helene Shoe, Meta.Reding Primary Care Samil Mecham: Christain [place] Other Clinician: Referring Karissa Meenan: Treating Yonas Bunda/Extender:Stone III, Alexander Bergeron, Jama Flavors Weeks in Treatment: 13 Vital Signs Time Taken: 13:19 Temperature (F): 99.3 Height (in): 62 Pulse (bpm): 106 Weight (lbs): 112 Respiratory Rate (breaths/min): 20 Body Mass Index (BMI): 20.5 Blood Pressure (mmHg): 154/64 Reference Range: 80 - 120 mg / dl Electronic Signature(s) Signed: 05/09/2019 2:18:02 PM By: Deon Pilling Entered By: Deon Pilling on 05/09/2019 13:20:55

## 2019-05-16 ENCOUNTER — Other Ambulatory Visit: Payer: Self-pay

## 2019-05-16 ENCOUNTER — Other Ambulatory Visit (HOSPITAL_BASED_OUTPATIENT_CLINIC_OR_DEPARTMENT_OTHER): Payer: Self-pay | Admitting: Internal Medicine

## 2019-05-16 ENCOUNTER — Encounter (HOSPITAL_BASED_OUTPATIENT_CLINIC_OR_DEPARTMENT_OTHER): Payer: Medicare Other | Attending: Internal Medicine | Admitting: Internal Medicine

## 2019-05-16 DIAGNOSIS — I87323 Chronic venous hypertension (idiopathic) with inflammation of bilateral lower extremity: Secondary | ICD-10-CM | POA: Diagnosis not present

## 2019-05-16 DIAGNOSIS — W19XXXA Unspecified fall, initial encounter: Secondary | ICD-10-CM | POA: Insufficient documentation

## 2019-05-16 DIAGNOSIS — S8002XA Contusion of left knee, initial encounter: Secondary | ICD-10-CM | POA: Diagnosis present

## 2019-05-16 DIAGNOSIS — Z6820 Body mass index (BMI) 20.0-20.9, adult: Secondary | ICD-10-CM | POA: Insufficient documentation

## 2019-05-16 DIAGNOSIS — I272 Pulmonary hypertension, unspecified: Secondary | ICD-10-CM | POA: Diagnosis not present

## 2019-05-16 DIAGNOSIS — I872 Venous insufficiency (chronic) (peripheral): Secondary | ICD-10-CM | POA: Insufficient documentation

## 2019-05-16 DIAGNOSIS — Z9981 Dependence on supplemental oxygen: Secondary | ICD-10-CM | POA: Diagnosis not present

## 2019-05-16 DIAGNOSIS — I2510 Atherosclerotic heart disease of native coronary artery without angina pectoris: Secondary | ICD-10-CM | POA: Insufficient documentation

## 2019-05-16 DIAGNOSIS — L97811 Non-pressure chronic ulcer of other part of right lower leg limited to breakdown of skin: Secondary | ICD-10-CM | POA: Diagnosis not present

## 2019-05-16 DIAGNOSIS — I10 Essential (primary) hypertension: Secondary | ICD-10-CM | POA: Diagnosis not present

## 2019-05-16 DIAGNOSIS — J449 Chronic obstructive pulmonary disease, unspecified: Secondary | ICD-10-CM | POA: Insufficient documentation

## 2019-05-16 DIAGNOSIS — I890 Lymphedema, not elsewhere classified: Secondary | ICD-10-CM | POA: Insufficient documentation

## 2019-05-16 NOTE — Progress Notes (Addendum)
[name] ([id number]) Visit Report for 05/16/2019 Arrival Information Details Patient Name: Date of Service: [name] 05/16/2019 2:00 PM Medical Record [id number] Patient Account Number: [id number] Date of Birth/Sex: Treating RN: 1946-01-30 (73 y.o. [name], Tammi Klippel Primary Care Devon Kingdon: Christain [place] Other Clinician: Referring Hodge Stachnik: Treating Zitlaly Malson/Extender:Robson, Glendale Chard, Letta Moynahan in Treatment: 14 Visit Information History Since Last Visit Added or deleted any medications: No Patient Arrived: Wheel Chair Any new allergies or adverse reactions: No Arrival Time: 14:15 Had a fall or experienced change in No activities of daily living that may affect Accompanied By: friend risk of falls: Transfer Assistance: None Signs or symptoms of abuse/neglect since last No Patient Identification Verified: Yes visito Secondary Verification Process Completed: Yes Hospitalized since last visit: No Patient Requires Transmission-Based No Implantable device outside of the clinic excluding No Precautions: cellular tissue based products placed in the center Patient Has Alerts: No since last visit: Has Dressing in Place as Prescribed: Yes Has Compression in Place as Prescribed: Yes Pain Present Now: No Electronic Signature(s) Signed: 05/16/2019 6:19:57 PM By: Deon Pilling Entered By: Deon Pilling on 05/16/2019 14:25:47 -------------------------------------------------------------------------------- Complex / Palliative Patient Assessment Details Patient Name: Date of Service: [name], [name] 05/16/2019 2:00 PM Medical Record [id number] Patient Account Number: [id number] Date of Birth/Sex: Treating RN: [birth year] (74 y.o. [name] Primary Care Samarie Pinder: Christain [place] Other Clinician: Referring Monetta Lick: Treating Ninnie Fein/Extender:Robson, Glendale Chard, Letta Moynahan in Treatment: 14 Palliative Management Criteria Complex Wound  Management Criteria Patient has remarkable or complex co-morbidities requiring medications or treatments that extend wound healing times. Examples: Diabetes mellitus with chronic renal failure or end stage renal disease requiring dialysis Advanced or poorly controlled rheumatoid arthritis Diabetes mellitus and end stage chronic obstructive pulmonary disease Active cancer with current chemo- or radiation therapy COPD, Oxygen dependent, Venous Insufficiency, Lymphedema Care Approach Wound Care Plan: Complex Wound Management Electronic Signature(s) Signed: 05/20/2019 7:57:15 AM By: Linton Ham MD Signed: 05/25/2019 12:09:50 PM By: Levan Hurst RN, BSN Entered By: Levan Hurst on 05/19/2019 12:43:07 -------------------------------------------------------------------------------- Compression Therapy Details Patient Name: Date of Service: [name] F. 05/16/2019 2:00 PM Medical Record [id number] Patient Account Number: [id number] Date of Birth/Sex: Treating RN: 02-Dec-1945 (73 y.o. [name] Primary Care Zanaiya Calabria: Christain [place] Other Clinician: Referring Meliah Appleman: Treating Tajah Schreiner/Extender:Robson, Glendale Chard, Letta Moynahan in Treatment: 14 Compression Therapy Performed for Wound Wound #5 Right,Proximal,Lateral Lower Leg Assessment: Performed By: Clinician Levan Hurst, RN Compression Type: Three Layer Post Procedure Diagnosis Same as Pre-procedure Electronic Signature(s) Signed: 05/16/2019 6:27:46 PM By: Levan Hurst RN, BSN Entered By: Levan Hurst on 05/16/2019 15:54:25 -------------------------------------------------------------------------------- Compression Therapy Details Patient Name: Date of Service: [name], [name] 05/16/2019 2:00 PM Medical Record [id number] Patient Account Number: [id number] Date of Birth/Sex: Treating RN: 1945-08-02 (73 y.o. [name] Primary Care Zeena Starkel: Christain [place] Other Clinician: Referring  Kadeisha Betsch: Treating Garion Wempe/Extender:Robson, Glendale Chard, Letta Moynahan in Treatment: 14 Compression Therapy Performed for Wound Wound #2 Right,Anterior Lower Leg Assessment: Performed By: Clinician Levan Hurst, RN Compression Type: Three Layer Post Procedure Diagnosis Same as Pre-procedure Electronic Signature(s) Signed: 05/16/2019 6:27:46 PM By: Levan Hurst RN, BSN Entered By: Levan Hurst on 05/16/2019 15:54:25 -------------------------------------------------------------------------------- Encounter Discharge Information Details Patient Name: Date of Service: [name] F. 05/16/2019 2:00 PM Medical Record [id number] Patient Account Number: [id number] Date of Birth/Sex: Treating RN: 01-24-1946 (73 y.o. [name] Primary Care Razi Hickle: Christain [place] Other Clinician: Referring Aram Domzalski: Treating Eimy Plaza/Extender:Robson, Glendale Chard, Letta Moynahan in  Treatment: 14 Encounter Discharge Information Items Post Procedure Vitals Discharge Condition: Stable Temperature (F): 98.7 Ambulatory Status: Wheelchair Pulse (bpm): 90 Discharge Destination: Home Respiratory Rate (breaths/min): 18 Transportation: Private Auto Blood Pressure (mmHg): 122/59 Accompanied By: friend Schedule Follow-up Appointment: Yes Clinical Summary of Care: Electronic Signature(s) Signed: 05/16/2019 6:19:57 PM By: Deon Pilling Entered By: Deon Pilling on 05/16/2019 17:15:34 -------------------------------------------------------------------------------- Lower Extremity Assessment Details Patient Name: Date of Service: [name] 05/16/2019 2:00 PM Medical Record [id number] Patient Account Number: [id number] Date of Birth/Sex: Treating RN: 09/16/1945 (73 y.o. [name] Primary Care Dicy Smigel: Christain [place] Other Clinician: Referring Kyra Laffey: Treating Zi Newbury/Extender:Robson, Glendale Chard, Jama Flavors Weeks in Treatment: 14 Edema Assessment Assessed:  [Left: No] [Right: Yes] Edema: [Left: Yes] [Right: Yes] Calf Left: Right: Point of Measurement: 29 cm From Medial Instep cm 26 cm Ankle Left: Right: Point of Measurement: 9 cm From Medial Instep cm 20.5 cm Vascular Assessment Pulses: Dorsalis Pedis Palpable: [Right:Yes] Electronic Signature(s) Signed: 05/16/2019 6:19:57 PM By: Deon Pilling Entered By: Deon Pilling on 05/16/2019 14:26:42 -------------------------------------------------------------------------------- Multi Wound Chart Details Patient Name: Date of Service: [name]. 05/16/2019 2:00 PM Medical Record [id number] Patient Account Number: [id number] Date of Birth/Sex: Treating RN: [birth year] (73 y.o. F) Primary Care Edward Trevino: Christain [place] Other Clinician: Referring Milady Fleener: Treating Keziah Avis/Extender:Robson, Glendale Chard, Letta Moynahan in Treatment: 14 Vital Signs Height(in): 7 Pulse(bpm): 71 Weight(lbs): 112 Blood Pressure(mmHg): 122/59 Body Mass Index(BMI): 20 Temperature(F): 98.7 Respiratory 18 Rate(breaths/min): Photos: [1:No Photos] [2:No Photos] [5:No Photos] Wound Location: [1:Left Knee - Anterior] [2:Right Lower Leg - Anterior Right Lower Leg - Lateral,] [5:Proximal] Wounding Event: [1:Trauma] [2:Gradually Appeared] [5:Blister] Primary Etiology: [1:Abrasion] [2:Venous Leg Ulcer] [5:Venous Leg Ulcer] Comorbid History: [1:Cataracts, Chronic Obstructive Pulmonary Disease (COPD), Hypertension, Peripheral Venous Disease] [2:Cataracts, Chronic Obstructive Pulmonary Disease (COPD), Hypertension, Peripheral Hypertension, Peripheral Venous Disease]  [5:Cataracts, Chronic Obstructive Pulmonary Disease (COPD), Venous Disease] Date Acquired: [1:01/10/2019] [2:01/28/2019] [5:04/10/2019] Weeks of Treatment: [1:14] [2:14] [5:4] Wound Status: [1:Open] [2:Open] [5:Open] Clustered Wound: [1:No] [2:Yes] [5:Yes] Clustered Quantity: [1:N/A] [2:N/A] [5:2] Measurements L x W x D [1:2x1.5x0.1]  [2:1.7x1.2x0.1] [5:0.8x1.5x0.1] (cm) Area (cm) : [1:2.356] [2:1.602] [5:0.942] Volume (cm) : [1:0.236] [2:0.16] [5:0.094] % Reduction in Area: [1:82.10%] [2:-240.10%] [5:68.00%] % Reduction in Volume: [1:94.00%] [2:-240.40%] [5:68.10%] Classification: [1:Full Thickness Without Exposed Support Structures Exposed Support Structures Exposed Support Structures] [2:Full Thickness Without] [5:Full Thickness Without] Exudate Amount: [1:Medium] [2:Medium] [5:Medium] Exudate Type: [1:Serosanguineous] [2:Serosanguineous] [5:Serosanguineous] Exudate Color: [1:red, brown] [2:red, brown] [5:red, brown] Wound Margin: [1:Distinct, outline attached Distinct, outline attached Distinct, outline attached] Granulation Amount: [1:Medium (34-66%)] [2:Large (67-100%)] [5:Medium (34-66%)] Granulation Quality: [1:Red, Pink] [2:Red, Pink, Hyper- granulation] [5:Red, Pink] Necrotic Amount: [1:Medium (34-66%)] [2:None Present (0%)] [5:Medium (34-66%)] Exposed Structures: [1:Fascia: No Fat Layer (Subcutaneous Fat Layer (Subcutaneous Fat Layer (Subcutaneous Tissue) Exposed: No Tendon: No Muscle: No Joint: No Bone: No] [2:Fascia: No Tissue) Exposed: No Tendon: No Muscle: No Joint: No Bone: No] [5:Fascia: No Tissue) Exposed:  No Tendon: No Muscle: No Joint: No Bone: No] Epithelialization: [1:Medium (34-66%)] [2:Small (1-33%)] [5:Small (1-33%)] Debridement: [1:Debridement - Excisional N/A] [5:N/A] Pre-procedure [1:15:33] [2:N/A] [5:N/A] Verification/Time Out Taken: Pain Control: [1:Other] [2:N/A] [5:N/A] Tissue Debrided: [1:Subcutaneous, Slough] [2:N/A] [5:N/A] Level: [1:Skin/Subcutaneous Tissue] [2:N/A] [5:N/A] Debridement Area (sq cm):3 [2:N/A] [5:N/A] Instrument: [1:Curette] [2:N/A] [5:N/A] Bleeding: [1:Minimum] [2:N/A] [5:N/A] Hemostasis Achieved: [1:Pressure] [2:N/A] [5:N/A] Procedural Pain: [1:0] [2:N/A] [5:N/A] Post Procedural Pain: [1:0] [2:N/A] [5:N/A] Debridement Treatment Procedure was tolerated [2:N/A]  [5:N/A] Response: [1:well] Post Debridement [1:2x1.5x0.1] [2:N/A] [5:N/A] Measurements L x W x D (  cm) Post Debridement [1:0.236] [2:N/A] [5:N/A] Volume: (cm) Procedures Performed: Debridement [2:Biopsy] [5:N/A] Treatment Notes Electronic Signature(s) Signed: 05/16/2019 6:46:53 PM By: Linton Ham MD Entered By: Linton Ham on 05/16/2019 15:40:36 -------------------------------------------------------------------------------- Multi-Disciplinary Care Plan Details Patient Name: Date of Service: [name]. 05/16/2019 2:00 PM Medical Record [id number] Patient Account Number: [id number] Date of Birth/Sex: Treating RN: [birth year] (73 y.o. [name] Primary Care Luby Seamans: Christain [place] Other Clinician: Referring Eulala Newcombe: Treating Key Cen/Extender:Robson, Glendale Chard, Letta Moynahan in Treatment: 14 Active Inactive Abuse / Safety / Falls / Self Care Management Nursing Diagnoses: History of Falls Potential for falls Goals: Patient will remain injury free related to falls Date Initiated: 02/04/2019 Target Resolution Date: 06/17/2019 Goal Status: Active Patient/caregiver will verbalize/demonstrate measures taken to prevent injury and/or falls Date Initiated: 02/04/2019 Date Inactivated: 03/10/2019 Target Resolution Date: 03/11/2019 Goal Status: Met Interventions: Assess fall risk on admission and as needed Assess: immobility, friction, shearing, incontinence upon admission and as needed Assess personal safety and home safety (as indicated) on admission and as needed Notes: Venous Leg Ulcer Nursing Diagnoses: Actual venous Insuffiency (use after diagnosis is confirmed) Knowledge deficit related to disease process and management Goals: Patient will maintain optimal edema control Date Initiated: 02/04/2019 Target Resolution Date: 06/17/2019 Goal Status: Active Patient/caregiver will verbalize understanding of disease process and disease management Date  Initiated: 02/04/2019 Date Inactivated: 03/10/2019 Target Resolution Date: 03/11/2019 Goal Status: Met Interventions: Assess peripheral edema status every visit. Compression as ordered Provide education on venous insufficiency Treatment Activities: Therapeutic compression applied : 02/04/2019 Notes: Wound/Skin Impairment Nursing Diagnoses: Impaired tissue integrity Knowledge deficit related to ulceration/compromised skin integrity Goals: Patient/caregiver will verbalize understanding of skin care regimen Date Initiated: 02/04/2019 Target Resolution Date: 06/17/2019 Goal Status: Active Ulcer/skin breakdown will have a volume reduction of 30% by week 4 Date Initiated: 02/04/2019 Date Inactivated: 04/07/2019 Target Resolution Date: 03/11/2019 Unmet Reason: see wound Goal Status: Unmet measurements. Interventions: Assess patient/caregiver ability to obtain necessary supplies Assess patient/caregiver ability to perform ulcer/skin care regimen upon admission and as needed Assess ulceration(s) every visit Provide education on ulcer and skin care Treatment Activities: Skin care regimen initiated : 02/04/2019 Topical wound management initiated : 02/04/2019 Notes: Electronic Signature(s) Signed: 05/16/2019 6:27:46 PM By: Levan Hurst RN, BSN Entered By: Levan Hurst on 05/16/2019 16:06:59 -------------------------------------------------------------------------------- Pain Assessment Details Patient Name: Date of Service: [name] F. 05/16/2019 2:00 PM Medical Record [id number] Patient Account Number: [id number] Date of Birth/Sex: Treating RN: [birth year] (73 y.o. [name] Primary Care Emmie Frakes: Christain [place] Other Clinician: Referring Anani Gu: Treating Ranee Peasley/Extender:Robson, Glendale Chard, Letta Moynahan in Treatment: 14 Active Problems Location of Pain Severity and Description of Pain Patient Has Paino No Site Locations Rate the pain. Current Pain Level:  0 Pain Management and Medication Current Pain Management: Medication: No Cold Application: No Rest: No Massage: No Activity: No T.E.N.S.: No Heat Application: No Leg drop or elevation: No Is the Current Pain Management Adequate: Adequate How does your wound impact your activities of daily livingo Sleep: No Bathing: No Appetite: No Relationship With Others: No Bladder Continence: No Emotions: No Bowel Continence: No Work: No Toileting: No Drive: No Dressing: No Hobbies: No Electronic Signature(s) Signed: 05/16/2019 6:19:57 PM By: Deon Pilling Entered By: Deon Pilling on 05/16/2019 14:26:23 -------------------------------------------------------------------------------- Patient/Caregiver Education Details Patient Name: Date of Service: ORIANNA, [name] 11/30/2020andnbsp2:00 PM Medical Record [id number] Patient Account Number: [id number] Date of Birth/Gender: July 15, 1945 (73 y.o. F) Treating RN: Levan Hurst Primary Care Physician: Christain [place] Other Clinician: Referring  Physician: Treating Physician/Extender:Robson, Glendale Chard, Letta Moynahan in Treatment: 14 Education Assessment Education Provided To: Patient Education Topics Provided Wound/Skin Impairment: Methods: Explain/Verbal Responses: State content correctly [employer]) Signed: 05/16/2019 6:27:46 PM By: Levan Hurst RN, BSN Entered By: Levan Hurst on 05/16/2019 16:07:11 -------------------------------------------------------------------------------- Wound Assessment Details Patient Name: Date of Service: [name] F. 05/16/2019 2:00 PM Medical Record [id number] Patient Account Number: [id number] Date of Birth/Sex: Treating RN: 07-02-1945 (73 y.o. [name], [name] Primary Care Sydny Schnitzler: Christain [place] Other Clinician: Referring Sheray Grist: Treating Dameian Crisman/Extender:Robson, Glendale Chard, Jama Flavors Weeks in Treatment: 14 Wound Status Wound Number: 1 Primary  Abrasion Etiology: Wound Location: Left Knee - Anterior Wound Open Wounding Event: Trauma Status: Date Acquired: 01/10/2019 Comorbid Cataracts, Chronic Obstructive Pulmonary Weeks Of Treatment: 14 History: Disease (COPD), Hypertension, Peripheral Clustered Wound: No Venous Disease Photos Wound Measurements Length: (cm) 2 % Reduct Width: (cm) 1.5 % Reduct Depth: (cm) 0.1 Epitheli Area: (cm) 2.356 Tunneli Volume: (cm) 0.236 Undermi Wound Description Full Thickness Without Exposed Support Foul Od Classification: Structures Slough/ Wound Distinct, outline attached Margin: Exudate Medium Amount: Exudate Serosanguineous Type: Exudate red, brown red, brown Color: Wound Bed Granulation Amount: Medium (34-66%) Granulation Quality: Red, Pink Fascia Ex Necrotic Amount: Medium (34-66%) Fat Layer Necrotic Quality: Adherent Slough Tendon Ex Muscle Ex Joint Exp Bone Expo or After Cleansing: No Fibrino Yes Exposed Structure posed: No (Subcutaneous Tissue) Exposed: No posed: No posed: No osed: No sed: No ion in Area: 82.1% ion in Volume: 94% alization: Medium (34-66%) ng: No ning: No Electronic Signature(s) Signed: 05/24/2019 2:54:39 PM By: Mikeal Hawthorne EMT/HBOT Signed: 05/25/2019 12:05:07 PM By: Deon Pilling Previous Signature: 05/16/2019 6:19:57 PM Version By: Deon Pilling Entered By: Mikeal Hawthorne on 05/23/2019 12:31:32 -------------------------------------------------------------------------------- Wound Assessment Details Patient Name: Date of Service: [name] F. 05/16/2019 2:00 PM Medical Record [id number] Patient Account Number: [id number] Date of Birth/Sex: Treating RN: [birth year] (73 y.o. [name], [name] Primary Care Tida Saner: Christain [place] Other Clinician: Referring Burlie Cajamarca: Treating Tosh Glaze/Extender:Robson, Glendale Chard, Letta Moynahan in Treatment: 14 Wound Status Wound Number: 2 Primary Venous Leg Ulcer Etiology: Wound  Location: Right Lower Leg - Anterior Wound Open Wounding Event: Gradually Appeared Status: Date Acquired: 01/28/2019 Comorbid Cataracts, Chronic Obstructive Pulmonary Weeks Of Treatment: 14 History: Disease (COPD), Hypertension, Peripheral Clustered Wound: Yes Venous Disease Photos Wound Measurements Length: (cm) 1.7 % Reducti Width: (cm) 1.2 % Reducti Depth: (cm) 0.1 Epithelia Area: (cm) 1.602 Tunnelin Volume: (cm) 0.16 Undermin Wound Description Classification: Full Thickness Without Exposed Support Structures Wound Distinct, outline attached Margin: Exudate Medium Amount: Exudate Serosanguineous Type: Exudate red, brown Color: Wound Bed Granulation Amount: Large (67-100%) Granulation Quality: Red, Pink, Hyper-granulation Necrotic Amount: None Present (0%) Foul Odor After Cleansing: No Slough/Fibrino No Exposed Structure Fascia Exposed: No Fat Layer (Subcutaneous Tissue) Exposed: No Tendon Exposed: No Muscle Exposed: No Joint Exposed: No Bone Exposed: No on in Area: -240.1% on in Volume: -240.4% lization: Small (1-33%) g: No ing: No Electronic Signature(s) Signed: 05/24/2019 2:54:39 PM By: Mikeal Hawthorne EMT/HBOT Signed: 05/25/2019 12:05:07 PM By: Deon Pilling Previous Signature: 05/16/2019 6:19:57 PM Version By: Deon Pilling Entered By: Mikeal Hawthorne on 05/23/2019 12:31:54 -------------------------------------------------------------------------------- Wound Assessment Details Patient Name: Date of Service: [name] F. 05/16/2019 2:00 PM Medical Record [id number] Patient Account Number: [id number] Date of Birth/Sex: Treating RN: 10-30-1945 (73 y.o. [name] Primary Care Janda Cargo: Christain [place] Other Clinician: Referring Anina Schnake: Treating Latiesha Harada/Extender:Robson, Glendale Chard, Jama Flavors Weeks in Treatment: 14 Wound Status Wound Number: 5 Primary Venous Leg  Ulcer Etiology: Wound Location: Right Lower Leg - Lateral,  Proximal Wound Open Wounding Event: Blister Status: Date Acquired: 04/10/2019 Comorbid Cataracts, Chronic Obstructive Pulmonary Weeks Of Treatment: 4 History: Disease (COPD), Hypertension, Peripheral Clustered Wound: Yes Venous Disease Photos Wound Measurements Length: (cm) 0.8 % Reduct Width: (cm) 1.5 % Reduct Depth: (cm) 0.1 Epitheli Clustered Quantity: 2 Tunnelin Area: (cm) 0.942 Undermi Volume: (cm) 0.094 Wound Description Classification: Full Thickness Without Exposed Support Foul Od Structures Slough/ Wound Distinct, outline attached Margin: Exudate Medium Amount: Exudate Serosanguineous Type: Exudate red, brown Color: Wound Bed Granulation Amount: Medium (34-66%) Granulation Quality: Red, Pink Fascia Necrotic Amount: Medium (34-66%) Fat Lay Necrotic Quality: Adherent Slough Tendon Muscle Joint E Bone Ex or After Cleansing: No Fibrino Yes Exposed Structure Exposed: No er (Subcutaneous Tissue) Exposed: No Exposed: No Exposed: No xposed: No posed: No ion in Area: 68% ion in Volume: 68.1% alization: Small (1-33%) g: No ning: No Electronic Signature(s) Signed: 05/24/2019 2:54:39 PM By: Mikeal Hawthorne EMT/HBOT Signed: 05/25/2019 12:05:07 PM By: Deon Pilling Previous Signature: 05/16/2019 6:19:57 PM Version By: Deon Pilling Entered By: Mikeal Hawthorne on 05/23/2019 12:32:18 -------------------------------------------------------------------------------- Vitals Details Patient Name: Date of Service: [name] F. 05/16/2019 2:00 PM Medical Record [id number] Patient Account Number: [id number] Date of Birth/Sex: Treating RN: 14-Apr-1946 (73 y.o. [name], [name] Primary Care Carollee Nussbaumer: Christain [place] Other Clinician: Referring Priscilla Kirstein: Treating Andreyah Natividad/Extender:Robson, Glendale Chard, Letta Moynahan in Treatment: 14 Vital Signs Time Taken: 14:21 Temperature (F): 98.7 Height (in): 62 Pulse (bpm): 90 Weight (lbs): 112 Respiratory Rate  (breaths/min): 18 Body Mass Index (BMI): 20.5 Blood Pressure (mmHg): 122/59 Reference Range: 80 - 120 mg / dl Electronic Signature(s) Signed: 05/16/2019 6:19:57 PM By: Deon Pilling Entered By: Deon Pilling on 05/16/2019 14:26:14

## 2019-05-16 NOTE — Progress Notes (Signed)
[name], [name] ([id number]) Visit Report for 05/16/2019 Biopsy Details Patient Name: Date of Service: Crystal, [name] 05/16/2019 2:00 PM Medical Record [id number] Patient Account Number: [id number] Date of Birth/Sex: Treating RN: 03/14/1946 (73 y.o. F) Primary Care Provider: Christain [place] Other Clinician: Referring Provider: Treating Provider/Extender:, Glendale Chard, Letta Moynahan in Treatment: 14 Biopsy Performed for: Wound #2 Right, Anterior Lower Leg Location(s): Wound Bed Performed By: Physician Ricard Dillon., MD Tissue Punch: No Number of Specimens Taken: 1 Specimen Sent To Pathology: Yes Level of Consciousness (Pre-procedure): Awake and Alert Pre-procedure Verification/Time-Out Yes - 15:30 Taken: Pain Control: Lidocaine Injectable Lidocaine Percent: 1% Instrument: Blade, Forceps Bleeding: Moderate Hemostasis Achieved: Silver Nitrate Procedural Pain: 0 Post Procedural Pain: 0 Response to Treatment: Procedure was tolerated well Level of Consciousness (Post-procedure): Awake and Alert Post Procedure Diagnosis Same as Pre-procedure Electronic Signature(s) Signed: 05/16/2019 6:46:53 PM By: Linton Ham MD Entered By: Linton Ham on 05/16/2019 15:41:46 -------------------------------------------------------------------------------- Debridement Details Patient Name: Date of Service: [name]. 05/16/2019 2:00 PM Medical Record [id number] Patient Account Number: [id number] Date of Birth/Sex: Treating RN: 1945-08-17 (73 y.o. F) Primary Care Provider: Christain Sacramento Other Clinician: Referring Provider: Treating Provider/Extender:, Glendale Chard, Letta Moynahan in Treatment: 14 Debridement Performed for Wound #1 Left,Anterior Knee Assessment: Performed By: Physician Ricard Dillon., MD Debridement Type: Debridement Level of Consciousness (Pre- Awake and Alert procedure): Pre-procedure Yes - 15:33 Verification/Time Out  Taken: Start Time: 15:33 Pain Control: Other : BENZOCAINE 20% spray Total Area Debrided (L x W): 2 (cm) x 1.5 (cm) = 3 (cm) Tissue and other material Viable, Non-Viable, Slough, Subcutaneous, Slough debrided: Level: Skin/Subcutaneous Tissue Debridement Description: Excisional Instrument: Curette Bleeding: Minimum Hemostasis Achieved: Pressure End Time: 15:34 Procedural Pain: 0 Post Procedural Pain: 0 Response to Treatment: Procedure was tolerated well Level of Consciousness Awake and Alert (Post-procedure): Post Debridement Measurements of Total Wound Length: (cm) 2 Width: (cm) 1.5 Depth: (cm) 0.1 Volume: (cm) 0.236 Character of Wound/Ulcer Post Improved Debridement: Post Procedure Diagnosis Same as Pre-procedure Electronic Signature(s) Signed: 05/16/2019 6:46:53 PM By: Linton Ham MD Entered By: Linton Ham on 05/16/2019 15:41:36 -------------------------------------------------------------------------------- HPI Details Patient Name: Date of Service: Crystal Fray F. 05/16/2019 2:00 PM Medical Record [id number] Patient Account Number: [id number] Date of Birth/Sex: Treating RN: Oct 25, 1945 (73 y.o. 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 6 weeks ago she had a fall and suffered a contusion on her left anterior patella area. She saw her primary doctor she has been prescribed Silvadene cream and cephalexin on 8/7 for 1 week. The area is not progressing towards closure and she is here for our review of this. Incidentally we were able to also discover an area on the right anterior lower tibial area which is a small wound that we think may have been there  for about 1 week. The patient saw Dr. early on 11/23/2018. He did not think the patient had any arterial issues based on triphasic waveforms with his hand-held Dopplers. She had venous reflux studies that showed abnormal reflux times in the common femoral vein, great saphenous vein in the distal thigh great saphenous vein at the knee and great saphenous vein at the mid calf similar findings on the left but he did not think that she  was a candidate for ablation stating that she had mild reflux in the great saphenous vein proximally but no dilation. There was no DVT Past medical history includes COPD, pulmonary hypertension, coronary disease, hypertension, chronic lower extremity edema ABIs in our clinic were 1.02 on the right and 0.86 on the left 8/28; patient readmitted to the clinic last week. Traumatic wound on the left anterior patella in the setting of fairly significant chronic venous changes. She also has a small area on the right anterior mid tibia area. 9/4; traumatic wound on the left anterior patella. We have been using Medihoney as the patient could not afford Santyl. This is really not doing improving things. We moved to Iodoflex today. Also unfortunately the area on the right anterior mid tibia is not healed 9/24; traumatic wound on the left anterior patella had a greenish drainage on the dressing. This is led to a culture being done of this area. I changed to Iodoflex last time this may have something to do with the color. The area on the right anterior tibia is actually larger. 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 This looks somewhat better. He has an area just below this on the left which is superficial. Finally she has the area on the right anterior pretibial area which looks better and more superficial. Extremely  damaged skin on the bilateral lower extremities I think which is mostly secondary to longstanding lymphedema. She does not want the right leg wrapped largely because she is developing fluid above the wraps just below her knee and in the thigh. I am concerned about not wrapping it however she has made her wishes clear 10/8; traumatic wound on the left anterior patella is smaller. We have been using silver alginate. She has 2 closely opposed wounds in the right anterior mid tibia. Some swelling and erythema in this area but no tenderness we have been using silver alginate here as well. She will not allow compression wraps. She has a compression stocking for the right lower leg 10/22; patient is not been here in 2 weeks. She has a injury on the left anterior patellar tendon area from a fall. She has broken down areas anteriorly on the right and today right lateral from severe venous inflammation with secondary lymphedema. She has not previously agreed to compression wraps. She has juxta lite stockings although these were not applied properly today. I am not clear what she has been putting on the wounds. She complains of a lot of pain on the right lateral calf 10/26; severe bilateral chronic venous hypertension with inflammation/stasis dermatitis and lymphedema. She finally let us wrap her right leg last week and things look a lot better although I did give her empiric Keflex. A lot less swelling a lot less inflammation in the right leg. She has been using her juxta lite on the left. She has 2 small open areas on the right leg to remain we have been using silver alginate 11/2; severe bilateral chronic venous hypertension with inflammation and stasis dermatitis and lymphedema. The skin on her right leg actually looks better. Still a small open area anteriorly. On the left she has the inner part of her juxta lite stocking the edema was not well proportioned. She was cautioned to use the external wrap  portion of this as well. The area on the left is on the anterior patella. This looks like  it is closing down some using silver 11/9; severe bilateral chronic venous hypertension with inflammation stasis dermatitis and lymphedema. Her edema control is better the degree of inflammation in her skin is better. She has remaining wounds on the right mid tibia, new area on the right lateral upper tibia and then the original area on her knee. We have been using silver alginate however the wounds look dry I have changed to silver collagen today. 11/16; severe bilateral chronic venous hypertension with inflammation/stasis dermatitis and lymphedema. Her edema control is much better and the skin on her lower legs looks better. She has remaining wounds in the right mid tibia right lateral upper tibia and the original area on her left patella. Silver collagen to the wounds that I started last week. 11/30; patient arrives today with the 1 remaining area on the left anterior patella very dry and covered and adherent debris we have been using moistened silver collagen. The area on the right mid tibia has rolled edges that are concerning for a malignancy. Electronic Signature(s) Signed: 05/16/2019 6:46:53 PM By: Linton Ham MD Entered By: Linton Ham on 05/16/2019 15:43:43 -------------------------------------------------------------------------------- Physical Exam Details Patient Name: Date of Service: [place], [name] 05/16/2019 2:00 PM Medical Record [id number] Patient Account Number: [id number] Date of Birth/Sex: Treating RN: 12-Jun-1946 (73 y.o. F) Primary Care Provider: Christain Sacramento Other Clinician: Referring Provider: Treating Provider/Extender:, Glendale Chard, Letta Moynahan in Treatment: 14 Constitutional Sitting or standing Blood Pressure is within target range for patient.. Pulse regular and within target range for patient.Marland Kitchen Respirations regular, non-labored and within  target range.. Temperature is normal and within the target range for the patient.Marland Kitchen Appears in no distress. Notes Wound exam Left anterior patella. Although the overall surface area of this wound is better, still very dry and very adherent debris under illumination. I used a #5 curette to remove necrotic surface. This seems to clean up quite nicely Right anterior mid tibia area almost looking hyper granular however with rolled senescent looking folds. I anesthetized the small area of this. Did a shave biopsy with a #15 blade specimen for pathology Electronic Signature(s) Signed: 05/16/2019 6:46:53 PM By: Linton Ham MD Entered By: Linton Ham on 05/16/2019 15:45:10 -------------------------------------------------------------------------------- Physician Orders Details Patient Name: Date of Service: [name]. 05/16/2019 2:00 PM Medical Record [id number] Patient Account Number: [id number] Date of Birth/Sex: Treating RN: Dec 18, 1945 (73 y.o. [name] Primary Care Provider: Christain [place] Other Clinician: Referring Provider: Treating Provider/Extender:, Glendale Chard, Letta Moynahan in Treatment: 14 Verbal / Phone Orders: No Diagnosis Coding ICD-10 Coding Code Description S80.02XD Contusion of left knee, subsequent encounter L97.811 Non-pressure chronic ulcer of other part of right lower leg limited to breakdown of skin I87.323 Chronic venous hypertension (idiopathic) with inflammation of bilateral lower extremity I89.0 Lymphedema, not elsewhere classified Follow-up Appointments Return Appointment in 1 week. Dressing Change Frequency Wound #1 Left,Anterior Knee Change Dressing every other day. Wound #2 Right,Anterior Lower Leg Do not change entire dressing for one week. Wound #5 Right,Proximal,Lateral Lower Leg Do not change entire dressing for one week. Skin Barriers/Peri-Wound Care Moisturizing lotion - patient to apply lotion to left leg at  night. TCA Cream or Ointment - liberally with lotion to both legs in clinic. Wound Cleansing Wound #1 Left,Anterior Knee May shower and wash wound with soap and water. Wound #2 Right,Anterior Lower Leg May shower with protection. Primary Wound Dressing Wound #1 Left,Anterior Knee Collagen - moisten with hydrogel (or KY jelly) Wound #2 Right,Anterior Lower Leg  Collagen - moisten with hydrogel (or KY jelly) Wound #5 Right,Proximal,Lateral Lower Leg Collagen - moisten with hydrogel (or KY jelly) Secondary Dressing Wound #1 Left,Anterior Knee Foam Border Wound #2 Right,Anterior Lower Leg ABD pad Wound #5 Right,Proximal,Lateral Lower Leg Foam Border Edema Control 3 Layer Compression System - Right Lower Extremity Avoid standing for long periods of time Elevate legs to the level of the heart or above for 30 minutes daily and/or when sitting, a frequency of: - throughout the day Exercise regularly Support Garment 30-40 mm/Hg pressure to: - patient to wear juxtalite HD on left leg. Apply in the am and remove at night. Patient to ensure to measure how tight the compression is daily. Please show patient how to apply juxtalite. Laboratory Bacteria identified in Tissue by Biopsy culture (MICRO) - Right anterior lower leg - (ICD10 L97.811 - Non- pressure chronic ulcer of other part of right lower leg limited to breakdown of skin) LOINC Code: 01007-1 Convenience Name: Biopsy specimen culture Electronic Signature(s) Signed: 05/16/2019 6:27:46 PM By: Levan Hurst RN, BSN Signed: 05/16/2019 6:46:53 PM By: Linton Ham MD Entered By: Levan Hurst on 05/16/2019 15:35:42 -------------------------------------------------------------------------------- Problem List Details Patient Name: Date of Service: Crystal Fray F. 05/16/2019 2:00 PM Medical Record [id number] Patient Account Number: [id number] Date of Birth/Sex: Treating RN: [birth year] (73 y.o. [name] Primary  Care Provider: Christain [place] Other Clinician: Referring Provider: Treating Provider/Extender:, Glendale Chard, Letta Moynahan in Treatment: 14 Active Problems ICD-10 Evaluated Encounter Code Description Active Date Today Diagnosis S80.02XD Contusion of left knee, subsequent encounter 02/04/2019 No Yes L97.811 Non-pressure chronic ulcer of other part of right lower 03/24/2019 No Yes leg limited to breakdown of skin I87.323 Chronic venous hypertension (idiopathic) with 02/04/2019 No Yes inflammation of bilateral lower extremity I89.0 Lymphedema, not elsewhere classified 02/04/2019 No Yes Inactive Problems ICD-10 Code Description Active Date Inactive Date L97.511 Non-pressure chronic ulcer of other part of right foot limited to 02/11/2019 02/11/2019 breakdown of skin Resolved Problems Electronic Signature(s) Signed: 05/16/2019 6:46:53 PM By: Linton Ham MD Entered By: Linton Ham on 05/16/2019 15:40:29 -------------------------------------------------------------------------------- Progress Note Details Patient Name: Date of Service: [name]. 05/16/2019 2:00 PM Medical Record [id number] Patient Account Number: [id number] Date of Birth/Sex: Treating RN: 1945/07/31 (73 y.o. F) Primary Care Provider: Christain [place] Other Clinician: Referring Provider: Treating Provider/Extender:, Glendale Chard, Letta Moynahan in Treatment: 14 Subjective History of Present Illness (HPI) ADMISSION 02/04/2019 Mrs. 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 6 weeks ago she had a fall and suffered a contusion on her left anterior patella area. She saw her primary doctor she has been prescribed Silvadene cream and cephalexin on 8/7 for 1 week. The area is not progressing towards closure and she is here for our review of this. Incidentally we were able to also discover an area on the right anterior  lower tibial area which is a small wound that we think may have been there for about 1 week. The patient saw Dr. early on 11/23/2018. He did not think the patient had any arterial issues based on triphasic waveforms with his hand-held Dopplers. She had venous reflux studies that showed abnormal reflux times in the common femoral vein, great saphenous vein in the distal thigh great saphenous vein at the knee and great saphenous vein at the mid calf similar findings on the left but he did not think that she was a candidate for ablation stating that  she had mild reflux in the great saphenous vein proximally but no dilation. There was no DVT Past medical history includes COPD, pulmonary hypertension, coronary disease, hypertension, chronic lower extremity edema ABIs in our clinic were 1.02 on the right and 0.86 on the left 8/28; patient readmitted to the clinic last week. Traumatic wound on the left anterior patella in the setting of fairly significant chronic venous changes. She also has a small area on the right anterior mid tibia area. 9/4; traumatic wound on the left anterior patella. We have been using Medihoney as the patient could not afford Santyl. This is really not doing improving things. We moved to Iodoflex today. Also unfortunately the area on the right anterior mid tibia is not healed 9/24; traumatic wound on the left anterior patella had a greenish drainage on the dressing. This is led to a culture being done of this area. I changed to Iodoflex last time this may have something to do with the color. The area on the right anterior tibia is actually larger. 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 This looks somewhat better. He has an area just below this on the left which is superficial. Finally she has the area on the right  anterior pretibial area which looks better and more superficial. Extremely damaged skin on the bilateral lower extremities I think which is mostly secondary to longstanding lymphedema. She does not want the right leg wrapped largely because she is developing fluid above the wraps just below her knee and in the thigh. I am concerned about not wrapping it however she has made her wishes clear 10/8; traumatic wound on the left anterior patella is smaller. We have been using silver alginate. She has 2 closely opposed wounds in the right anterior mid tibia. Some swelling and erythema in this area but no tenderness we have been using silver alginate here as well. She will not allow compression wraps. She has a compression stocking for the right lower leg 10/22; patient is not been here in 2 weeks. She has a injury on the left anterior patellar tendon area from a fall. She has broken down areas anteriorly on the right and today right lateral from severe venous inflammation with secondary lymphedema. She has not previously agreed to compression wraps. She has juxta lite stockings although these were not applied properly today. I am not clear what she has been putting on the wounds. She complains of a lot of pain on the right lateral calf 10/26; severe bilateral chronic venous hypertension with inflammation/stasis dermatitis and lymphedema. She finally let us wrap her right leg last week and things look a lot better although I did give her empiric Keflex. A lot less swelling a lot less inflammation in the right leg. She has been using her juxta lite on the left. She has 2 small open areas on the right leg to remain we have been using silver alginate 11/2; severe bilateral chronic venous hypertension with inflammation and stasis dermatitis and lymphedema. The skin on her right leg actually looks better. Still a small open area anteriorly. ooOn the left she has the inner part of her juxta lite stocking the  edema was not well proportioned. She was cautioned to use the external wrap portion of this as well. The area on the left is on the anterior patella. This looks like it is closing down some using silver  11/9; severe bilateral chronic venous hypertension with inflammation stasis dermatitis and lymphedema. Her edema control is better the degree of inflammation in her skin is better. She has remaining wounds on the right mid tibia, new area on the right lateral upper tibia and then the original area on her knee. We have been using silver alginate however the wounds look dry I have changed to silver collagen today. 11/16; severe bilateral chronic venous hypertension with inflammation/stasis dermatitis and lymphedema. Her edema control is much better and the skin on her lower legs looks better. She has remaining wounds in the right mid tibia right lateral upper tibia and the original area on her left patella. Silver collagen to the wounds that I started last week. 11/30; patient arrives today with the 1 remaining area on the left anterior patella very dry and covered and adherent debris we have been using moistened silver collagen. The area on the right mid tibia has rolled edges that are concerning for a malignancy. Objective Constitutional Sitting or standing Blood Pressure is within target range for patient.. Pulse regular and within target range for patient.Marland Kitchen Respirations regular, non-labored and within target range.. Temperature is normal and within the target range for the patient.Marland Kitchen Appears in no distress. Vitals Time Taken: 2:21 PM, Height: 62 in, Weight: 112 lbs, BMI: 20.5, Temperature: 98.7 F, Pulse: 90 bpm, Respiratory Rate: 18 breaths/min, Blood Pressure: 122/59 mmHg. General Notes: Wound exam ooLeft anterior patella. Although the overall surface area of this wound is better, still very dry and very adherent debris under illumination. I used a #5 curette to remove necrotic surface.  This seems to clean up quite nicely ooRight anterior mid tibia area almost looking hyper granular however with rolled senescent looking folds. I anesthetized the small area of this. Did a shave biopsy with a #15 blade specimen for pathology Integumentary (Hair, Skin) Wound #1 status is Open. Original cause of wound was Trauma. The wound is located on the Left,Anterior Knee. The wound measures 2cm length x 1.5cm width x 0.1cm depth; 2.356cm^2 area and 0.236cm^3 volume. There is no tunneling or undermining noted. There is a medium amount of serosanguineous drainage noted. The wound margin is distinct with the outline attached to the wound base. There is medium (34-66%) red, pink granulation within the wound bed. There is a medium (34-66%) amount of necrotic tissue within the wound bed including Adherent Slough. Wound #2 status is Open. Original cause of wound was Gradually Appeared. The wound is located on the Right,Anterior Lower Leg. The wound measures 1.7cm length x 1.2cm width x 0.1cm depth; 1.602cm^2 area and 0.16cm^3 volume. There is no tunneling or undermining noted. There is a medium amount of serosanguineous drainage noted. The wound margin is distinct with the outline attached to the wound base. There is large (67-100%) red, pink, hyper - granulation within the wound bed. There is no necrotic tissue within the wound bed. Wound #5 status is Open. Original cause of wound was Blister. The wound is located on the Right,Proximal,Lateral Lower Leg. The wound measures 0.8cm length x 1.5cm width x 0.1cm depth; 0.942cm^2 area and 0.094cm^3 volume. There is no tunneling or undermining noted. There is a medium amount of serosanguineous drainage noted. The wound margin is distinct with the outline attached to the wound base. There is medium (34-66%) red, pink granulation within the wound bed. There is a medium (34-66%) amount of necrotic tissue within the wound bed including Adherent  Slough. Assessment Active Problems ICD-10 Contusion of left knee, subsequent  encounter Non-pressure chronic ulcer of other part of right lower leg limited to breakdown of skin Chronic venous hypertension (idiopathic) with inflammation of bilateral lower extremity Lymphedema, not elsewhere classified Procedures Wound #1 Pre-procedure diagnosis of Wound #1 is an Abrasion located on the Left,Anterior Knee . There was a Excisional Skin/Subcutaneous Tissue Debridement with a total area of 3 sq cm performed by Ricard Dillon., MD. With the following instrument(s): Curette to remove Viable and Non-Viable tissue/material. Material removed includes Subcutaneous Tissue and Slough and after achieving pain control using Other (BENZOCAINE 20% spray). No specimens were taken. A time out was conducted at 15:33, prior to the start of the procedure. A Minimum amount of bleeding was controlled with Pressure. The procedure was tolerated well with a pain level of 0 throughout and a pain level of 0 following the procedure. Post Debridement Measurements: 2cm length x 1.5cm width x 0.1cm depth; 0.236cm^3 volume. Character of Wound/Ulcer Post Debridement is improved. 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 There was a biopsy performed by Ricard Dillon., MD. There was a biopsy performed on Wound Bed. The skin was cleansed and prepped with anti-septic followed by pain control using Lidocaine Injectable: 1%. Tissue was removed at its base with the following instrument(s): Blade and Forceps and sent to pathology. A Moderate amount of bleeding was controlled with Silver Nitrate. A time out was conducted at 15:30, prior to the start of the procedure. The procedure was tolerated well with a pain level of 0 throughout and a pain level of 0 following the procedure. 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 There was a Three Layer Compression Therapy Procedure by Levan Hurst, RN. 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 There was a Three Layer Compression Therapy Procedure by Levan Hurst, RN. Post procedure Diagnosis Wound #5: Same as Pre-Procedure Plan Follow-up Appointments: Return Appointment in 1 week. Dressing Change Frequency: Wound #1 Left,Anterior Knee: Change Dressing every other day. Wound #2 Right,Anterior Lower Leg: Do not change entire dressing for one week. Wound #5 Right,Proximal,Lateral Lower Leg: Do not change entire dressing for one week. Skin Barriers/Peri-Wound Care: Moisturizing lotion - patient to apply lotion to left leg at night. TCA Cream or Ointment - liberally with lotion to both legs in clinic. Wound Cleansing: Wound #1 Left,Anterior Knee: May shower and wash wound with soap and water. Wound #2 Right,Anterior Lower Leg: May shower with protection. Primary Wound Dressing: Wound #1 Left,Anterior Knee: Collagen - moisten with hydrogel (or KY jelly) Wound #2 Right,Anterior Lower Leg: Collagen - moisten with hydrogel (or KY jelly) Wound #5 Right,Proximal,Lateral Lower Leg: Collagen - moisten with hydrogel (or KY jelly) Secondary Dressing: Wound #1 Left,Anterior Knee: Foam Border Wound #2 Right,Anterior Lower Leg: ABD pad Wound #5 Right,Proximal,Lateral Lower Leg: Foam Border Edema Control: 3 Layer Compression System - Right Lower Extremity Avoid standing for long periods of time Elevate legs to the level of the heart or above for 30 minutes daily and/or when sitting, a frequency of: - throughout the day Exercise regularly Support Garment 30-40 mm/Hg pressure to: - patient to wear  juxtalite HD on left leg. Apply in the am and remove at  night. Patient to ensure to measure how tight the compression is daily. Please show patient how to apply juxtalite. Laboratory ordered were: Biopsy specimen culture - Right anterior lower leg 1. Continue with the silver collagen to the left anterior patella. 2. I have biopsied the area on the right mid tibia for pathology Electronic Signature(s) Signed: 05/16/2019 6:27:46 PM By: Levan Hurst RN, BSN Signed: 05/16/2019 6:46:53 PM By: Linton Ham MD Entered By: Levan Hurst on 05/16/2019 15:54:38 -------------------------------------------------------------------------------- SuperBill Details Patient Name: Date of Service: ELEXIS, POLLAK. 05/16/2019 Medical Record [id number] Patient Account Number: [id number] Date of Birth/Sex: Treating RN: 1945/12/31 (72 y.o. F) Primary Care Provider: Christain [place] Other Clinician: Referring Provider: Treating Provider/Extender:, Glendale Chard, Jama Flavors Weeks in Treatment: 14 Diagnosis Coding ICD-10 Codes Code Description S80.02XD Contusion of left knee, subsequent encounter L97.811 Non-pressure chronic ulcer of other part of right lower leg limited to breakdown of skin I87.323 Chronic venous hypertension (idiopathic) with inflammation of bilateral lower extremity I89.0 Lymphedema, not elsewhere classified Facility Procedures The patient participates with Medicare or their insurance follows the Medicare Facility Guidelines: CPT4 Description Modifier Quantity Code 88502774 11042 - DEB SUBQ TISSUE 20 SQ CM/< 1 ICD-10 Diagnosis Description S80.02XD Contusion of left knee,  subsequent encounter The patient participates with Medicare or their insurance follows the Medicare Facility Guidelines: [id number] 11102-Tangential biopsy of skin (e.g., shave, scoop, saucerize, curette) single 68 1 lesion ICD-10 Diagnosis Description L97.811 Non-pressure  chronic ulcer of other part of right  lower leg limited to breakdown of skin Physician Procedures CPT4: Code 7209470 11 Description: 042 - WC PHYS SUBQ TISS 20 SQ CM ICD-10 Diagnosis Description S80.02XD Contusion of left knee, subsequent encounter Modifier: Quantity: 1 CPT4: 11102 Description: Tangential biopsy of skin (e.g., shave, scoop, saucerize, curette) single lesion ICD-10 Diagnosis Description L97.811 Non-pressure chronic ulcer of other part of right lower leg limite skin Modifier: 59 d to breakdown Quantity: 1 of Electronic Signature(s) Signed: 05/16/2019 6:46:53 PM By: Linton Ham MD Entered By: Linton Ham on 05/16/2019 15:46:57

## 2019-05-23 ENCOUNTER — Other Ambulatory Visit: Payer: Self-pay

## 2019-05-23 ENCOUNTER — Encounter (HOSPITAL_BASED_OUTPATIENT_CLINIC_OR_DEPARTMENT_OTHER): Payer: Medicare Other | Attending: Internal Medicine | Admitting: Internal Medicine

## 2019-05-23 DIAGNOSIS — S8002XA Contusion of left knee, initial encounter: Secondary | ICD-10-CM | POA: Insufficient documentation

## 2019-05-23 DIAGNOSIS — W19XXXA Unspecified fall, initial encounter: Secondary | ICD-10-CM | POA: Insufficient documentation

## 2019-05-23 DIAGNOSIS — I87323 Chronic venous hypertension (idiopathic) with inflammation of bilateral lower extremity: Secondary | ICD-10-CM | POA: Diagnosis not present

## 2019-05-23 DIAGNOSIS — C44712 Basal cell carcinoma of skin of right lower limb, including hip: Secondary | ICD-10-CM | POA: Diagnosis not present

## 2019-05-23 DIAGNOSIS — Z9981 Dependence on supplemental oxygen: Secondary | ICD-10-CM | POA: Diagnosis not present

## 2019-05-23 DIAGNOSIS — J449 Chronic obstructive pulmonary disease, unspecified: Secondary | ICD-10-CM | POA: Diagnosis not present

## 2019-05-23 DIAGNOSIS — I890 Lymphedema, not elsewhere classified: Secondary | ICD-10-CM | POA: Diagnosis not present

## 2019-05-24 NOTE — Progress Notes (Signed)
BLANCA, CARREON ([id number]) Visit Report for 03/24/2019 Arrival Information Details Patient Name: Date of Service: FABIANNA, KEATS 03/24/2019 2:00 PM Medical Record [id number] Patient Account Number: [id number] Date of Birth/Sex: Treating RN: 10-23-1945 (73 y.o. Helene Shoe, Tammi Klippel Primary Care Biridiana Twardowski: Christain [place] Other Clinician: Referring Arif Amendola: Treating Tora Prunty/Extender:Robson, Glendale Chard, Letta Moynahan in Treatment: 6 Visit Information History Since Last Visit Added or deleted any medications: No Patient Arrived: Wheel Chair Any new allergies or adverse reactions: No Arrival Time: 15:00 Had a fall or experienced change in No activities of daily living that may affect Accompanied By: friend risk of falls: Transfer Assistance: None Signs or symptoms of abuse/neglect since last No Patient Identification Verified: Yes visito Secondary Verification Process Completed: Yes Hospitalized since last visit: No Patient Requires Transmission-Based No Implantable device outside of the clinic excluding No Precautions: cellular tissue based products placed in the center Patient Has Alerts: No since last visit: Has Dressing in Place as Prescribed: Yes Pain Present Now: Yes Electronic Signature(s) Signed: 05/24/2019 3:02:15 PM By: Sandre Kitty Entered By: Sandre Kitty on 03/24/2019 15:02:16 -------------------------------------------------------------------------------- Lower Extremity Assessment Details Patient Name: Date of Service: ALCIE, RUNIONS 03/24/2019 2:00 PM Medical Record [id number] Patient Account Number: [id number] Date of Birth/Sex: Treating RN: 08/26/1945 (73 y.o. [name] Primary Care Reham Slabaugh: Christain [place] Other Clinician: Referring Mumin Denomme: Treating Alfonsa Vaile/Extender:Robson, Glendale Chard, Jama Flavors Weeks in Treatment: 6 Edema Assessment Assessed: [Left: No] [Right: No] Edema: [Left: Yes] [Right: Yes] Calf Left:  Right: Point of Measurement: 29 cm From Medial Instep 30.8 cm 33 cm Ankle Left: Right: Point of Measurement: 9 cm From Medial Instep 22 cm 22.2 cm Vascular Assessment Pulses: Dorsalis Pedis Palpable: [Left:Yes] [Right:Yes] Electronic Signature(s) Signed: 03/29/2019 5:52:09 PM By: Levan Hurst RN, BSN Entered By: Levan Hurst on 03/24/2019 15:19:05 -------------------------------------------------------------------------------- Multi Wound Chart Details Patient Name: Date of Service: [name] 03/24/2019 2:00 PM Medical Record [id number] Patient Account Number: [id number] Date of Birth/Sex: Treating RN: [birth year] (73 y.o. Helene Shoe, Meta.Reding Primary Care Elin Fenley: Christain [place] Other Clinician: Referring Chisom Aust: Treating Dejon Lukas/Extender:Robson, Glendale Chard, Jama Flavors Weeks in Treatment: 6 Vital Signs Height(in): 88 Pulse(bpm): 34 Weight(lbs): 112 Blood Pressure(mmHg): 148/59 Body Mass Index(BMI): 20 Temperature(F): 98.4 Respiratory 22 Rate(breaths/min): Photos: [1:No Photos] [2:No Photos] [3:No Photos] Wound Location: [1:Left Knee - Anterior] [2:Right Lower Leg - Anterior] [3:Left Knee - Medial] Wounding Event: [1:Trauma] [2:Gradually Appeared] [3:Gradually Appeared] Primary Etiology: [1:Abrasion] [2:Venous Leg Ulcer] [3:Abrasion] Comorbid History: [1:Cataracts, Chronic Obstructive Pulmonary Disease (COPD), Hypertension, Peripheral Venous Disease] [2:Cataracts, Chronic Obstructive Pulmonary Disease (COPD), Hypertension, Peripheral Venous Disease] [3:Cataracts, Chronic Obstructive  Pulmonary Disease (COPD), Hypertension, Peripheral Venous Disease] Date Acquired: [1:01/10/2019] [2:01/28/2019] [3:03/10/2019] Weeks of Treatment: [1:6] [2:6] [3:2] Wound Status: [1:Open] [2:Open] [3:Healed - Epithelialized] Measurements L x W x D 2.5x1.5x0.1 [2:1x0.8x0.1] [3:0x0x0] (cm) Area (cm) : [1:2.945] [2:0.628] [3:0] Volume (cm) : [1:0.295] [2:0.063] [3:0] %  Reduction in Area: [1:77.70%] [2:-33.30%] [3:100.00%] % Reduction in Volume: [1:92.50%] [2:-34.00%] [3:100.00%] Classification: [1:Full Thickness Without Exposed Support Structures Exposed Support Structures Exposed Support Structures] [2:Full Thickness Without] [3:Full Thickness Without] Exudate Amount: [1:Medium] [2:Medium] [3:None Present] Exudate Type: [1:Serosanguineous] [2:Serosanguineous] [3:N/A] Exudate Color: [1:red, brown] [2:red, brown] [3:N/A] Wound Margin: [1:Well defined, not attached Flat and Intact] [3:Distinct, outline attached] Granulation Amount: [1:Large (67-100%)] [2:Large (67-100%)] [3:None Present (0%)] Granulation Quality: [1:Pink] [2:Red] [3:N/A] Necrotic Amount: [1:Small (1-33%)] [2:None Present (0%)] [3:None Present (0%)] Exposed Structures: [1:Fat Layer (Subcutaneous Fat Layer (Subcutaneous Fascia: No Tissue) Exposed: Yes Fascia: No Tendon: No Muscle:  No Joint: No Bone: No] [2:Tissue) Exposed: Yes Fascia: No Tendon: No Muscle: No Joint: No Bone: No] [3:Fat Layer (Subcutaneous Tissue)  Exposed: No Tendon: No Muscle: No Joint: No Bone: No] Epithelialization: [1:Small (1-33%)] [2:Small (1-33%)] [3:Large (67-100%)] Debridement: [1:Debridement - Excisional N/A] [3:N/A] Pre-procedure [1:15:49] [2:N/A] [3:N/A] Verification/Time Out Taken: Pain Control: [1:Lidocaine 4% Topical Solution] [2:N/A] [3:N/A] Tissue Debrided: [1:Subcutaneous, Slough] [2:N/A] [3:N/A] Level: [1:Skin/Subcutaneous Tissue] [2:N/A] [3:N/A] Debridement Area (sq cm):3.75 [2:N/A] [3:N/A] Instrument: [1:Curette] [2:N/A] [3:N/A] Bleeding: [1:Minimum] [2:N/A] [3:N/A] Hemostasis Achieved: [1:Pressure] [2:N/A] [3:N/A] Procedural Pain: [1:0] [2:N/A] [3:N/A] Post Procedural Pain: [1:0] [2:N/A] [3:N/A] Debridement Treatment Procedure was tolerated [2:N/A] [3:N/A] Response: [1:well] Post Debridement [1:2.5x1.5x0.1] [2:N/A] [3:N/A] Measurements L x W x D (cm) Post Debridement [1:0.295] [2:N/A]  [3:N/A] Volume: (cm) Procedures Performed: Debridement [2:N/A] [3:N/A] Treatment Notes Electronic Signature(s) Signed: 03/24/2019 5:49:31 PM By: Linton Ham MD Signed: 03/24/2019 6:06:48 PM By: Deon Pilling Entered By: Linton Ham on 03/24/2019 15:56:23 -------------------------------------------------------------------------------- Multi-Disciplinary Care Plan Details Patient Name: Date of Service: [name] 03/24/2019 2:00 PM Medical Record [id number] Patient Account Number: [id number] Date of Birth/Sex: Treating RN: Oct 19, 1945 (73 y.o. Helene Shoe, Tammi Klippel Primary Care Beulah Capobianco: Christain [place] Other Clinician: Referring Keylan Costabile: Treating Chrystopher Stangl/Extender:Robson, Glendale Chard, Letta Moynahan in Treatment: 6 Active Inactive Abuse / Safety / Falls / Self Care Management Nursing Diagnoses: History of Falls Potential for falls Goals: Patient will remain injury free related to falls Date Initiated: 02/04/2019 Target Resolution Date: 04/15/2019 Goal Status: Active Patient/caregiver will verbalize/demonstrate measures taken to prevent injury and/or falls Date Initiated: 02/04/2019 Date Inactivated: 03/10/2019 Target Resolution Date: 03/11/2019 Goal Status: Met Interventions: Assess fall risk on admission and as needed Assess: immobility, friction, shearing, incontinence upon admission and as needed Assess personal safety and home safety (as indicated) on admission and as needed Notes: Venous Leg Ulcer Nursing Diagnoses: Actual venous Insuffiency (use after diagnosis is confirmed) Knowledge deficit related to disease process and management Goals: Patient will maintain optimal edema control Date Initiated: 02/04/2019 Target Resolution Date: 04/15/2019 Goal Status: Active Patient/caregiver will verbalize understanding of disease process and disease management Date Initiated: 02/04/2019 Date Inactivated: 03/10/2019 Target Resolution Date: 03/11/2019 Goal Status:  Met Interventions: Assess peripheral edema status every visit. Compression as ordered Provide education on venous insufficiency Treatment Activities: Therapeutic compression applied : 02/04/2019 Notes: Wound/Skin Impairment Nursing Diagnoses: Impaired tissue integrity Knowledge deficit related to ulceration/compromised skin integrity Goals: Patient/caregiver will verbalize understanding of skin care regimen Date Initiated: 02/04/2019 Target Resolution Date: 04/15/2019 Goal Status: Active Ulcer/skin breakdown will have a volume reduction of 30% by week 4 Date Initiated: 02/04/2019 Target Resolution Date: 03/11/2019 Goal Status: Active Interventions: Assess patient/caregiver ability to obtain necessary supplies Assess patient/caregiver ability to perform ulcer/skin care regimen upon admission and as needed Assess ulceration(s) every visit Provide education on ulcer and skin care Treatment Activities: Skin care regimen initiated : 02/04/2019 Topical wound management initiated : 02/04/2019 Notes: Electronic Signature(s) Signed: 03/24/2019 6:06:48 PM By: Deon Pilling Entered By: Deon Pilling on 03/24/2019 15:03:12 -------------------------------------------------------------------------------- Pain Assessment Details Patient Name: Date of Service: LUA, FENG 03/24/2019 2:00 PM Medical Record [id number] Patient Account Number: [id number] Date of Birth/Sex: Treating RN: [birth year] (73 y.o. [name] Primary Care Trevel Dillenbeck: Christain [place] Other Clinician: Referring Yeny Schmoll: Treating Lenea Bywater/Extender:Robson, Glendale Chard, Letta Moynahan in Treatment: 6 Active Problems Location of Pain Severity and Description of Pain Patient Has Paino Yes Site Locations Rate the pain. Current Pain Level: 8 Pain Management and Medication Current Pain Management: Electronic Signature(s) Signed: 03/24/2019  6:06:48 PM By: Deon Pilling Signed: 05/24/2019 3:02:15 PM By: Sandre Kitty Entered By: Sandre Kitty on 03/24/2019 15:02:41 -------------------------------------------------------------------------------- Patient/Caregiver Education Details Patient Name: Date of Service: [name] 10/8/2020andnbsp2:00 PM Medical Record [id number] Patient Account Number: [id number] Date of Birth/Gender: 09-08-1945 (73 y.o. F) Treating RN: Deon Pilling Primary Care Physician: Christain [place] Other Clinician: Referring Physician: Treating Physician/Extender:Robson, Glendale Chard, Letta Moynahan in Treatment: 6 Education Assessment Education Provided To: Patient Education Topics Provided Venous: Handouts: Managing Venous Disease and Related Ulcers Methods: Explain/Verbal, Printed Responses: Reinforcements needed Electronic Signature(s) Signed: 03/24/2019 6:06:48 PM By: Deon Pilling Entered By: Deon Pilling on 03/24/2019 15:03:24 -------------------------------------------------------------------------------- Wound Assessment Details Patient Name: Date of Service: AUDRIS, SPEAKER. 03/24/2019 2:00 PM Medical Record [id number] Patient Account Number: [id number] Date of Birth/Sex: Treating RN: [birth year] (73 y.o. Helene Shoe, Meta.Reding Primary Care Kannon Baum: Christain [place] Other Clinician: Referring Stan Cantave: Treating Chiann Goffredo/Extender:Robson, Glendale Chard, Jama Flavors Weeks in Treatment: 6 Wound Status Wound Number: 1 Primary Abrasion Etiology: Wound Location: Left Knee - Anterior Wound Open Wounding Event: Trauma Status: Date Acquired: 01/10/2019 Comorbid Cataracts, Chronic Obstructive Pulmonary Weeks Of Treatment: 6 History: Disease (COPD), Hypertension, Peripheral Clustered Wound: No Venous Disease Photos Wound Measurements Length: (cm) 2.5 % Reduct Width: (cm) 1.5 % Reduct Depth: (cm) 0.1 Epitheli Area: (cm) 2.945 Tunneli Volume: (cm) 0.295 Undermi Wound Description Classification: Full Thickness Without Exposed Support Foul  Od Structures Slough/ Wound Well defined, not attached Margin: Exudate Medium Amount: Exudate Serosanguineous Type: Exudate red, brown Color: Wound Bed Granulation Amount: Large (67-100%) Granulation Quality: Pink Fascia E Necrotic Amount: Small (1-33%) Fat Laye Necrotic Quality: Adherent Slough Tendon E Muscle E Joint Ex Bone Exp Electronic Signature(s) Signed: 03/25/2019 6:01:54 PM By: Deon Pilling Signed: 03/28/2019 3:43:33 PM By: Mikeal Hawthorne EMT/HBOT Previous Signature: 03/24/2019 6:06:48 PM Version By: Lady Deutscher Entered By: Mikeal Hawthorne on 10/09 or After Cleansing: No Fibrino Yes Exposed Structure xposed: No r (Subcutaneous Tissue) Exposed: Yes xposed: No xposed: No posed: No osed: No i /2020 09:10:05 ion in Area: 77.7% ion in Volume: 92.5% alization: Small (1-33%) ng: No ning: No -------------------------------------------------------------------------------- Wound Assessment Details Patient Name: Date of Service: TSUYAKO, JOLLEY 03/24/2019 2:00 PM Medical Record [id number] Patient Account Number: [id number] Date of Birth/Sex: Treating RN: 09-03-1945 (73 y.o. Helene Shoe, Meta.Reding Primary Care Ameshia Pewitt: Christain [place] Other Clinician: Referring Jakiah Goree: Treating Shantae Vantol/Extender:Robson, Glendale Chard, Jama Flavors Weeks in Treatment: 6 Wound Status Wound Number: 2 Primary Venous Leg Ulcer Etiology: Wound Location: Right Lower Leg - Anterior Wound Open Wounding Event: Gradually Appeared Status: Date Acquired: 01/28/2019 Comorbid Cataracts, Chronic Obstructive Pulmonary Weeks Of Treatment: 6 History: Disease (COPD), Hypertension, Peripheral Clustered Wound: No Venous Disease Photos Wound Measurements Length: (cm) 1 % Reduct Width: (cm) 0.8 % Reduct Depth: (cm) 0.1 Epitheli Area: (cm) 0.628 Tunneli Volume: (cm) 0.063 Undermi Wound Description Classification: Full Thickness Without Exposed Support Foul Od Structures  Slough/ Wound Flat and Intact Margin: Exudate Medium Amount: Exudate Serosanguineous Type: Exudate red, brown Color: Wound Bed Granulation Amount: Large (67-100%) Granulation Quality: Red Fascia Ex Necrotic Amount: None Present (0%) Fat Layer Tendon Ex Muscle Ex Joint Exp Bone Expo or After Cleansing: No Fibrino No Exposed Structure posed: No (Subcutaneous Tissue) Exposed: Yes posed: No posed: No osed: No sed: No ion in Area: -33.3% ion in Volume: -34% alization: Small (1-33%) ng: No ning: No Electronic Signature(s) Signed: 03/25/2019 6:01:54 PM By: Deon Pilling Signed: 03/28/2019 3:43:33 PM By: Mikeal Hawthorne EMT/HBOT Previous Signature: 03/24/2019 6:06:48 PM Version  By: Deon Pilling Entered ByMikeal Hawthorne on 03/25/2019 09:09:28 -------------------------------------------------------------------------------- Wound Assessment Details Patient Name: Date of Service: KENTRELL, GUETTLER. 03/24/2019 2:00 PM Medical Record [id number] Patient Account Number: [id number] Date of Birth/Sex: Treating RN: [birth year] (73 y.o. Helene Shoe, Meta.Reding Primary Care Reather Steller: Christain [place] Other Clinician: Referring Korra Christine: Treating Maniyah Moller/Extender:Robson, Glendale Chard, Jama Flavors Weeks in Treatment: 6 Wound Status Wound Number: 3 Primary Abrasion Etiology: Wound Location: Left Knee - Medial Wound Healed - Epithelialized Wounding Event: Gradually Appeared Status: Date Acquired: 03/10/2019 Comorbid Cataracts, Chronic Obstructive Pulmonary Weeks Of Treatment: 2 History: Disease (COPD), Hypertension, Peripheral Clustered Wound: No Venous Disease Photos Wound Measurements Length: (cm) 0 % Reducti Width: (cm) 0 % Reducti Depth: (cm) 0 Epithelia Area: (cm) 0 Tunnelin Volume: (cm) 0 Undermin Wound Description Classification: Full Thickness Without Exposed Support Foul Odo Structures Slough/F Wound Distinct, outline attached Margin: Exudate None  Present Amount: Wound Bed Granulation Amount: None Present (0%) Necrotic Amount: None Present (0%) Fascia E Fat Laye Tendon E Muscle E Joint Ex Bone Exp r After Cleansing: No ibrino No Exposed Structure xposed: No r (Subcutaneous Tissue) Exposed: No xposed: No xposed: No posed: No osed: No on in Area: 100% on in Volume: 100% lization: Large (67-100%) g: No ing: No Electronic Signature(s) Signed: 03/25/2019 6:01:54 PM By: Deon Pilling Signed: 03/28/2019 3:43:33 PM By: Mikeal Hawthorne EMT/HBOT Previous Signature: 03/24/2019 6:06:48 PM Version By: Deon Pilling Entered By: Mikeal Hawthorne on 03/25/2019 09:09:48 -------------------------------------------------------------------------------- Vitals Details Patient Name: Date of Service: [name] 03/24/2019 2:00 PM Medical Record [id number] Patient Account Number: [id number] Date of Birth/Sex: Treating RN: 1946-04-30 (73 y.o. Helene Shoe, Meta.Reding Primary Care Wilfred Siverson: Christain [place] Other Clinician: Referring Haddie Bruhl: Treating Lakendrick Paradis/Extender:Robson, Glendale Chard, Letta Moynahan in Treatment: 6 Vital Signs Time Taken: 15:02 Temperature (F): 98.4 Height (in): 62 Pulse (bpm): 94 Weight (lbs): 112 Respiratory Rate (breaths/min): 22 Body Mass Index (BMI): 20.5 Blood Pressure (mmHg): 148/59 Reference Range: 80 - 120 mg / dl Electronic Signature(s) Signed: 05/24/2019 3:02:15 PM By: Sandre Kitty Entered By: Sandre Kitty on 03/24/2019 15:02:31

## 2019-05-24 NOTE — Progress Notes (Signed)
FLORIA, BRANDAU ([id number]) Visit Report for 05/02/2019 Arrival Information Details Patient Name: Date of Service: [name], [name] 05/02/2019 2:30 PM Medical Record [id number] Patient Account Number: [id number] Date of Birth/Sex: Treating RN: 22-Jan-1946 (73 y.o. Orvan Falconer Primary Care Mikaelyn Arthurs: Christain [place] Other Clinician: Referring Lada Fulbright: Treating Zamantha Strebel/Extender:Robson, Glendale Chard, Letta Moynahan in Treatment: 12 Visit Information History Since Last Visit All ordered tests and consults were completed: No Patient Arrived: Wheel Chair Added or deleted any medications: No Arrival Time: 15:23 Any new allergies or adverse reactions: No Accompanied By: friend Had a fall or experienced change in No activities of daily living that may affect Transfer Assistance: None risk of falls: Patient Identification Verified: Yes Signs or symptoms of abuse/neglect since last No Secondary Verification Process Completed: Yes visito Patient Requires Transmission-Based No Hospitalized since last visit: No Precautions: Implantable device outside of the clinic excluding No Patient Has Alerts: No cellular tissue based products placed in the center since last visit: Has Dressing in Place as Prescribed: Yes Has Compression in Place as Prescribed: Yes Pain Present Now: No Electronic Signature(s) Signed: 05/24/2019 2:50:47 PM By: Carlene Coria RN Entered By: Carlene Coria on 05/02/2019 15:23:51 -------------------------------------------------------------------------------- Compression Therapy Details Patient Name: Date of Service: [name] 05/02/2019 2:30 PM Medical Record [id number] Patient Account Number: [id number] Date of Birth/Sex: Treating RN: March 21, 1946 (73 y.o. [name] Primary Care Dessie Tatem: Christain [place] Other Clinician: Referring Everitt Wenner: Treating Sheketa Ende/Extender:Robson, Glendale Chard, Letta Moynahan in Treatment: 12 Compression  Therapy Performed for Wound Wound #5 Right,Proximal,Lateral Lower Leg Assessment: Performed By: Clinician Levan Hurst, RN Compression Type: Three Layer Post Procedure Diagnosis Same as Pre-procedure Electronic Signature(s) Signed: 05/02/2019 5:59:11 PM By: Levan Hurst RN, BSN Entered By: Levan Hurst on 05/02/2019 16:07:03 -------------------------------------------------------------------------------- Compression Therapy Details Patient Name: Date of Service: [name], [name] 05/02/2019 2:30 PM Medical Record [id number] Patient Account Number: [id number] Date of Birth/Sex: Treating RN: 04-01-1946 (73 y.o. [name] Primary Care Idell Hissong: Christain [place] Other Clinician: Referring Hafsa Lohn: Treating Keeghan Mcintire/Extender:Robson, Glendale Chard, Letta Moynahan in Treatment: 12 Compression Therapy Performed for Wound Wound #2 Right,Anterior Lower Leg Assessment: Performed By: Clinician Levan Hurst, RN Compression Type: Three Layer Post Procedure Diagnosis Same as Pre-procedure Electronic Signature(s) Signed: 05/02/2019 5:59:11 PM By: Levan Hurst RN, BSN Entered By: Levan Hurst on 05/02/2019 16:07:03 -------------------------------------------------------------------------------- Encounter Discharge Information Details Patient Name: Date of Service: [name], [name] 05/02/2019 2:30 PM Medical Record [id number] Patient Account Number: [id number] Date of Birth/Sex: Treating RN: [birth year] (73 y.o. [name] Primary Care Cong Hightower: Christain [place] Other Clinician: Referring Espn Zeman: Treating Riven Beebe/Extender:Robson, Glendale Chard, Letta Moynahan in Treatment: 12 Encounter Discharge Information Items Discharge Condition: Stable Ambulatory Status: Wheelchair Discharge Destination: Home Transportation: Private Auto Accompanied By: friend Schedule Follow-up Appointment: Yes Clinical Summary of Care: Electronic Signature(s) Signed:  05/02/2019 5:37:24 PM By: Deon Pilling Entered By: Deon Pilling on 05/02/2019 16:30:25 -------------------------------------------------------------------------------- Lower Extremity Assessment Details Patient Name: Date of Service: [name], [name] 05/02/2019 2:30 PM Medical Record [id number] Patient Account Number: [id number] Date of Birth/Sex: Treating RN: Mar 20, 1946 (73 y.o. Orvan Falconer Primary Care Arrielle Mcginn: Christain [place] Other Clinician: Referring Kristi Hyer: Treating Melvenia Favela/Extender:Robson, Glendale Chard, Jama Flavors Weeks in Treatment: 12 Edema Assessment Assessed: [Left: No] [Right: No] Edema: [Left: Yes] [Right: Yes] Calf Left: Right: Point of Measurement: 29 cm From Medial Instep 29 cm 26 cm Ankle Left: Right: Point of Measurement: 9 cm From Medial Instep 22 cm 19 cm Electronic Signature(s) Signed: 05/24/2019 2:50:47  PM By: Carlene Coria RN Entered By: Carlene Coria on 05/02/2019 15:25:14 -------------------------------------------------------------------------------- Multi Wound Chart Details Patient Name: Date of Service: [name], [name] 05/02/2019 2:30 PM Medical Record [id number] Patient Account Number: [id number] Date of Birth/Sex: Treating RN: 1946/01/02 (73 y.o. [name] Primary Care Shannon Kirkendall: Christain [place] Other Clinician: Referring Micheale Schlack: Treating Darrick Greenlaw/Extender:Robson, Glendale Chard, Letta Moynahan in Treatment: 12 Vital Signs Height(in): 81 Pulse(bpm): 118 Weight(lbs): 112 Blood Pressure(mmHg): 149/75 Body Mass Index(BMI): 20 Temperature(F): 99.3 Respiratory 20 Rate(breaths/min): Photos: [1:No Photos] [2:No Photos] [5:No Photos] Wound Location: [1:Left Knee - Anterior] [2:Right Lower Leg - Anterior Right Lower Leg - Lateral,] [5:Proximal] Wounding Event: [1:Trauma] [2:Gradually Appeared] [5:Blister] Primary Etiology: [1:Abrasion] [2:Venous Leg Ulcer] [5:Venous Leg Ulcer] Comorbid History: [1:Cataracts, Chronic  Obstructive Pulmonary Disease (COPD), Hypertension, Peripheral Venous Disease] [2:Cataracts, Chronic Obstructive Pulmonary Disease (COPD), Hypertension, Peripheral Hypertension, Peripheral Venous Disease]  [5:Cataracts, Chronic Obstructive Pulmonary Disease (COPD), Venous Disease] Date Acquired: [1:01/10/2019] [2:01/28/2019] [5:04/10/2019] Weeks of Treatment: [1:12] [2:12] [5:2] Wound Status: [1:Open] [2:Open] [5:Open] Clustered Wound: [1:No] [2:Yes] [5:No] Measurements L x W x D 2.5x1.4x0.1 [2:1.5x1.5x0.1] [5:1x1.2x0.1] (cm) Area (cm) : [1:2.749] [2:1.767] [5:0.942] Volume (cm) : [1:0.275] [2:0.177] [5:0.094] % Reduction in Area: [1:79.20%] [2:-275.20%] [5:68.00%] % Reduction in Volume: 93.10% [2:-276.60%] [5:68.10%] Classification: [1:Full Thickness Without Exposed Support Structures Exposed Support Structures Exposed Support Structures] [2:Full Thickness Without] [5:Full Thickness Without] Exudate Amount: [1:Medium] [2:Medium] [5:Small] Exudate Type: [1:Serosanguineous] [2:Serosanguineous] [5:Serosanguineous] Exudate Color: [1:red, brown] [2:red, brown] [5:red, brown] Wound Margin: [1:Well defined, not attached Flat and Intact] [5:N/A] Granulation Amount: [1:Medium (34-66%)] [2:Large (67-100%)] [5:None Present (0%)] Granulation Quality: [1:Pink, Pale] [2:Red, Pink] [5:N/A] Necrotic Amount: [1:Medium (34-66%)] [2:None Present (0%)] [5:Large (67-100%)] Exposed Structures: [1:Fat Layer (Subcutaneous Fat Layer (Subcutaneous Fat Layer (Subcutaneous Tissue) Exposed: Yes Fascia: No Tendon: No Muscle: No Joint: No Bone: No] [2:Tissue) Exposed: Yes Fascia: No Tendon: No Muscle: No Joint: No Bone: No] [5:Tissue) Exposed: Yes  Fascia: No Tendon: No Muscle: No Joint: No Bone: No] Epithelialization: [1:Small (1-33%)] [2:None Compression Therapy] [5:None Compression Therapy] Treatment Notes Wound #1 (Left, Anterior Knee) 1. Cleanse With Wound Cleanser 2. Periwound Care Skin Prep 3. Primary  Dressing Applied Collegen AG Hydrogel or K-Y Jelly 4. Secondary Dressing Foam Border Dressing 5. Secured With Self Adhesive Bandage Wound #2 (Right, Anterior Lower Leg) 1. Cleanse With Wound Cleanser Soap and water 2. Periwound Care Moisturizing lotion TCA Cream 3. Primary Dressing Applied Collegen AG Hydrogel or K-Y Jelly 4. Secondary Dressing ABD Pad 6. Support Layer Applied 3 layer compression wrap Notes stretch net Wound #5 (Right, Proximal, Lateral Lower Leg) 1. Cleanse With Wound Cleanser 2. Periwound Care Skin Prep 3. Primary Dressing Applied Collegen AG Hydrogel or K-Y Jelly 4. Secondary Dressing Foam Border Dressing 5. Secured With Office manager) Signed: 05/02/2019 5:46:17 PM By: Linton Ham MD Signed: 05/02/2019 5:59:11 PM By: Levan Hurst RN, BSN Entered By: Linton Ham on 05/02/2019 16:40:49 -------------------------------------------------------------------------------- Multi-Disciplinary Care Plan Details Patient Name: Date of Service: DERRIONA, BRANSCOM 05/02/2019 2:30 PM Medical Record [id number] Patient Account Number: [id number] Date of Birth/Sex: Treating RN: 1946-05-14 (73 y.o. [name] Primary Care Latha Staunton: Christain [place] Other Clinician: Referring Sammantha Mehlhaff: Treating Kato Wieczorek/Extender:Robson, Glendale Chard, Letta Moynahan in Treatment: 12 Active Inactive Abuse / Safety / Falls / Self Care Management Nursing Diagnoses: History of Falls Potential for falls Goals: Patient will remain injury free related to falls Date Initiated: 02/04/2019 Target Resolution Date: 05/13/2019 Goal Status: Active Patient/caregiver will verbalize/demonstrate measures taken to prevent injury  and/or falls Date Initiated: 02/04/2019 Date Inactivated: 03/10/2019 Target Resolution Date: 03/11/2019 Goal Status: Met Interventions: Assess fall risk on admission and as needed Assess: immobility, friction,  shearing, incontinence upon admission and as needed Assess personal safety and home safety (as indicated) on admission and as needed Notes: Venous Leg Ulcer Nursing Diagnoses: Actual venous Insuffiency (use after diagnosis is confirmed) Knowledge deficit related to disease process and management Goals: Patient will maintain optimal edema control Date Initiated: 02/04/2019 Target Resolution Date: 05/13/2019 Goal Status: Active Patient/caregiver will verbalize understanding of disease process and disease management Date Initiated: 02/04/2019 Date Inactivated: 03/10/2019 Target Resolution Date: 03/11/2019 Goal Status: Met Interventions: Assess peripheral edema status every visit. Compression as ordered Provide education on venous insufficiency Treatment Activities: Therapeutic compression applied : 02/04/2019 Notes: Wound/Skin Impairment Nursing Diagnoses: Impaired tissue integrity Knowledge deficit related to ulceration/compromised skin integrity Goals: Patient/caregiver will verbalize understanding of skin care regimen Date Initiated: 02/04/2019 Target Resolution Date: 05/13/2019 Goal Status: Active Ulcer/skin breakdown will have a volume reduction of 30% by week 4 Date Initiated: 02/04/2019 Date Inactivated: 04/07/2019 Target Resolution Date: 03/11/2019 Unmet Reason: see wound Goal Status: Unmet Goal Status: Unmet measurements. Interventions: Assess patient/caregiver ability to obtain necessary supplies Assess patient/caregiver ability to perform ulcer/skin care regimen upon admission and as needed Assess ulceration(s) every visit Provide education on ulcer and skin care Treatment Activities: Skin care regimen initiated : 02/04/2019 Topical wound management initiated : 02/04/2019 Notes: Electronic Signature(s) Signed: 05/02/2019 5:59:11 PM By: Levan Hurst RN, BSN Entered By: Levan Hurst on 05/02/2019  16:02:59 -------------------------------------------------------------------------------- Pain Assessment Details Patient Name: Date of Service: [name] 05/02/2019 2:30 PM Medical Record [id number] Patient Account Number: [id number] Date of Birth/Sex: Treating RN: [birth year] (73 y.o. Orvan Falconer Primary Care Katrinka Herbison: Christain [place] Other Clinician: Referring Astrid Vides: Treating Laurel Smeltz/Extender:Robson, Glendale Chard, Letta Moynahan in Treatment: 12 Active Problems Location of Pain Severity and Description of Pain Patient Has Paino No Site Locations Pain Management and Medication Current Pain Management: Electronic Signature(s) Signed: 05/24/2019 2:50:47 PM By: Carlene Coria RN Entered By: Carlene Coria on 05/02/2019 15:24:26 -------------------------------------------------------------------------------- Patient/Caregiver Education Details Patient Name: Date of Service: [name] 11/16/2020andnbsp2:30 PM Medical Record [id number] Patient Account Number: [id number] Date of Birth/Gender: [birth year] (73 y.o. F) Treating RN: Levan Hurst Primary Care Physician: Christain [place] Other Clinician: Referring Physician: Treating Physician/Extender:Robson, Glendale Chard, Letta Moynahan in Treatment: 12 Education Assessment Education Provided To: Patient Education Topics Provided Wound/Skin Impairment: Methods: Explain/Verbal Responses: State content correctly [employer]) Signed: 05/02/2019 5:59:11 PM By: Levan Hurst RN, BSN Entered By: Levan Hurst on 05/02/2019 16:03:15 -------------------------------------------------------------------------------- Wound Assessment Details Patient Name: Date of Service: Tudisco, Kaity F. 05/02/2019 2:30 PM Medical Record [id number] Patient Account Number: [id number] Date of Birth/Sex: Treating RN: [birth year] (73 y.o. Orvan Falconer Primary Care Nemesio Castrillon: Christain [place] Other  Clinician: Referring Maximiano Lott: Treating Cosimo Schertzer/Extender:Robson, Glendale Chard, Jama Flavors Weeks in Treatment: 12 Wound Status Wound Number: 1 Primary Abrasion Etiology: Wound Location: Left Knee - Anterior Wound Open Wounding Event: Trauma Status: Date Acquired: 01/10/2019 Comorbid Cataracts, Chronic Obstructive Pulmonary Weeks Of Treatment: 12 History: Disease (COPD), Hypertension, Peripheral Clustered Wound: No Venous Disease Photos Wound Measurements Length: (cm) 2.5 % Reduct Width: (cm) 1.4 % Reduct Depth: (cm) 0.1 Epitheli Area: (cm) 2.749 Tunneli Volume: (cm) 0.275 Undermi Wound Description Full Thickness Without Exposed Support Foul Od Classification: Structures Slough/ Wound Well defined, not attached Margin: Exudate Medium Amount: Exudate Serosanguineous Type: Exudate red, brown Color: Wound Bed Granulation Amount: Medium (34-66%)  Granulation Quality: Pink, Pale Fascia Necrotic Amount: Medium (34-66%) Fat Lay Necrotic Quality: Adherent Slough Tendon Muscle Joint E Bone Ex or After Cleansing: No Fibrino Yes Exposed Structure Exposed: No er (Subcutaneous Tissue) Exposed: Yes Exposed: No Exposed: No xposed: No posed: No ion in Area: 79.2% ion in Volume: 93.1% alization: Small (1-33%) ng: No ning: No Electronic Signature(s) Signed: 05/04/2019 4:21:10 PM By: Mikeal Hawthorne EMT/HBOT Signed: 05/24/2019 2:50:47 PM By: Carlene Coria RN Entered By: Mikeal Hawthorne on 05/04/2019 11:42:14 -------------------------------------------------------------------------------- Wound Assessment Details Patient Name: Date of Service: [name] 05/02/2019 2:30 PM Medical Record [id number] Patient Account Number: [id number] Date of Birth/Sex: Treating RN: May 18, 1946 (73 y.o. Orvan Falconer Primary Care Neziah Vogelgesang: Christain [place] Other Clinician: Referring Asianna Brundage: Treating Jonathon Tan/Extender:Robson, Glendale Chard, Letta Moynahan in Treatment:  12 Wound Status Wound Number: 2 Primary Venous Leg Ulcer Etiology: Wound Location: Right Lower Leg - Anterior Wound Open Wounding Event: Gradually Appeared Status: Date Acquired: 01/28/2019 Comorbid Cataracts, Chronic Obstructive Pulmonary Weeks Of Treatment: 12 History: Disease (COPD), Hypertension, Peripheral Clustered Wound: Yes Venous Disease Photos Wound Measurements Length: (cm) 1.5 % Reduct Width: (cm) 1.5 % Reduct Depth: (cm) 0.1 Epitheli Area: (cm) 1.767 Tunneli Volume: (cm) 0.177 Undermi Wound Description Classification: Full Thickness Without Exposed Support Foul Odo Structures Slough/F Wound Flat and Intact Margin: Exudate Medium Amount: Exudate Serosanguineous Type: Exudate red, brown Color: Wound Bed Granulation Amount: Large (67-100%) Granulation Quality: Red, Pink Fascia E Necrotic Amount: None Present (0%) Fat Laye Tendon E Muscle E Joint Ex Bone Exp r After Cleansing: No ibrino No Exposed Structure xposed: No r (Subcutaneous Tissue) Exposed: Yes xposed: No xposed: No posed: No osed: No ion in Area: -275.2% ion in Volume: -276.6% alization: None ng: No ning: No Electronic Signature(s) Signed: 05/04/2019 4:21:10 PM By: Mikeal Hawthorne EMT/HBOT Signed: 05/24/2019 2:50:47 PM By: Carlene Coria RN Entered By: Mikeal Hawthorne on 05/04/2019 11:42:34 -------------------------------------------------------------------------------- Wound Assessment Details Patient Name: Date of Service: [name] 05/02/2019 2:30 PM Medical Record [id number] Patient Account Number: [id number] Date of Birth/Sex: Treating RN: Oct 25, 1945 (73 y.o. Orvan Falconer Primary Care Davontae Prusinski: Christain [place] Other Clinician: Referring Aubria Vanecek: Treating Kahlea Cobert/Extender:Robson, Glendale Chard, Letta Moynahan in Treatment: 12 Wound Status Wound Number: 5 Primary Venous Leg Ulcer Etiology: Wound Location: Right Lower Leg - Lateral, Proximal Wound  Open Wounding Event: Blister Status: Date Acquired: 04/10/2019 Comorbid Cataracts, Chronic Obstructive Pulmonary Weeks Of Treatment: 2 History: Disease (COPD), Hypertension, Peripheral Clustered Wound: No Venous Disease Photos Wound Measurements Length: (cm) 1 % Reduc Width: (cm) 1.2 % Reduc Depth: (cm) 0.1 Epithel Area: (cm) 0.942 Tunnel Volume: (cm) 0.094 Underm Wound Description Classification: Full Thickness Without Exposed Support Foul O Structures Slough Exudate Small Amount: Exudate Serosanguineous Type: Exudate red, brown Color: Wound Bed Granulation Amount: None Present (0%) Necrotic Amount: Large (67-100%) Fascia Necrotic Quality: Adherent Slough Fat La Tendon Muscle Joint Bone E Electronic Signature(s) Signed: 05/04/2019 4:21:10 PM By: Mikeal Hawthorne EMT/HBOT Signed: 05/24/2019 2:50:47 PM By: Carlene Coria RN Entered By: Mikeal Hawthorne on 11/18/ dor After Cleansing: No Lynita Lombard Yes Exposed Structure Exposed: No yer (Subcutaneous Tissue) Exposed: Yes Exposed: No Exposed: No Exposed: No xposed: No 2020 11:42:58 tion in Area: 68% tion in Volume: 68.1% ialization: None ing: No ining: No -------------------------------------------------------------------------------- Vitals Details Patient Name: Date of Service: RICCI, PAFF 05/02/2019 2:30 PM Medical Record [id number] Patient Account Number: [id number] Date of Birth/Sex: Treating RN: 02/14/1946 (73 y.o. Orvan Falconer Primary Care Zelpha Messing: Christain [place] Other Clinician: Referring Sheritta Deeg:  Treating Janaysha Depaulo/Extender:Robson, Glendale Chard, Jama Flavors Weeks in Treatment: 12 Vital Signs Time Taken: 15:23 Temperature (F): 99.3 Height (in): 62 Pulse (bpm): 118 Weight (lbs): 112 Respiratory Rate (breaths/min): 20 Body Mass Index (BMI): 20.5 Blood Pressure (mmHg): 149/75 Reference Range: 80 - 120 mg / dl Electronic Signature(s) Signed: 05/24/2019 2:50:47 PM By: Carlene Coria  RN Entered By: Carlene Coria on 05/02/2019 15:24:19

## 2019-05-24 NOTE — Progress Notes (Signed)
AIKAM, HELLICKSON ([id number]) Visit Report for 04/25/2019 Arrival Information Details Patient Name: Date of Service: [name], [name] 04/25/2019 2:30 PM Medical Record [id number] Patient Account Number: [id number] Date of Birth/Sex: Treating RN: Dec 11, 1945 (73 y.o. Orvan Falconer Primary Care Oaklie Durrett: Christain [place] Other Clinician: Referring Baker Moronta: Treating Thamar Holik/Extender:Robson, Glendale Chard, Letta Moynahan in Treatment: 11 Visit Information History Since Last Visit All ordered tests and consults were completed: No Patient Arrived: Wheel Chair Added or deleted any medications: No Arrival Time: 14:39 Any new allergies or adverse reactions: No Accompanied By: friend Had a fall or experienced change in No activities of daily living that may affect Transfer Assistance: None risk of falls: Patient Identification Verified: Yes Signs or symptoms of abuse/neglect since last No Patient Requires Transmission-Based No visito Precautions: Hospitalized since last visit: No Patient Has Alerts: No Implantable device outside of the clinic excluding No cellular tissue based products placed in the center since last visit: Has Dressing in Place as Prescribed: Yes Has Compression in Place as Prescribed: Yes Pain Present Now: No Electronic Signature(s) Signed: 05/24/2019 2:53:59 PM By: Carlene Coria RN Entered By: Carlene Coria on 04/25/2019 14:54:11 -------------------------------------------------------------------------------- Compression Therapy Details Patient Name: Date of Service: [name], [name] 04/25/2019 2:30 PM Medical Record [id number] Patient Account Number: [id number] Date of Birth/Sex: Treating RN: 07/22/1945 (73 y.o. [name] Primary Care Therron Sells: Christain [place] Other Clinician: Referring Marketa Midkiff: Treating Garren Greenman/Extender:Robson, Glendale Chard, Letta Moynahan in Treatment: 11 Compression Therapy Performed for Wound Wound #2 Right,Anterior  Lower Leg Assessment: Performed By: Clinician Levan Hurst, RN Compression Type: Three Layer Post Procedure Diagnosis Same as Pre-procedure Electronic Signature(s) Signed: 04/25/2019 5:46:53 PM By: Levan Hurst RN, BSN Entered By: Levan Hurst on 04/25/2019 15:20:49 -------------------------------------------------------------------------------- Encounter Discharge Information Details Patient Name: Date of Service: Crystal Fray F. 04/25/2019 2:30 PM Medical Record [id number] Patient Account Number: [id number] Date of Birth/Sex: Treating RN: 06-03-1946 (73 y.o. [name] Primary Care Hilma Steinhilber: Christain [place] Other Clinician: Referring Alichia Alridge: Treating Giovannie Scerbo/Extender:Robson, Glendale Chard, Letta Moynahan in Treatment: 11 Encounter Discharge Information Items Post Procedure Vitals Discharge Condition: Stable Temperature (F): 98.9 Ambulatory Status: Wheelchair Pulse (bpm): 103 Discharge Destination: Home Respiratory Rate (breaths/min): 18 Transportation: Private Auto Blood Pressure (mmHg): 163/90 Schedule Follow-up Appointment: Yes Clinical Summary of Care: Patient Declined Electronic Signature(s) Signed: 04/25/2019 5:46:53 PM By: Levan Hurst RN, BSN Entered By: Levan Hurst on 04/25/2019 17:29:09 -------------------------------------------------------------------------------- Lower Extremity Assessment Details Patient Name: Date of Service: Crystal Fray F. 04/25/2019 2:30 PM Medical Record [id number] Patient Account Number: [id number] Date of Birth/Sex: Treating RN: 12/17/1945 (73 y.o. Orvan Falconer Primary Care Shonte Soderlund: Christain [place] Other Clinician: Referring Janmichael Giraud: Treating Devaun Hernandez/Extender:Robson, Glendale Chard, Jama Flavors Weeks in Treatment: 11 Edema Assessment Assessed: [Left: No] [Right: No] Edema: [Left: Yes] [Right: Yes] Calf Left: Right: Point of Measurement: 29 cm From Medial Instep 32 cm 28 cm Ankle Left:  Right: Point of Measurement: 9 cm From Medial Instep 24 cm 21 cm Electronic Signature(s) Signed: 05/24/2019 2:53:59 PM By: Carlene Coria RN Entered By: Carlene Coria on 04/25/2019 14:55:02 -------------------------------------------------------------------------------- Multi Wound Chart Details Patient Name: Date of Service: Crystal Barth. 04/25/2019 2:30 PM Medical Record [id number] Patient Account Number: [id number] Date of Birth/Sex: Treating RN: 11-Mar-1946 (73 y.o. [name] Primary Care Jerauld Bostwick: Christain [place] Other Clinician: Referring Maecie Sevcik: Treating Shareena Nusz/Extender:Robson, Glendale Chard, Letta Moynahan in Treatment: 11 Vital Signs Height(in): 62 Pulse(bpm): 103 Weight(lbs): 112 Blood Pressure(mmHg): 163/90 Body Mass Index(BMI): 20 Temperature(F): 98.9 Respiratory 18  Rate(breaths/min): Photos: [1:No Photos] [2:No Photos] [5:No Photos] Wound Location: [1:Left Knee - Anterior] [2:Right Lower Leg - Anterior Right Lower Leg - Lateral,] [5:Proximal] Wounding Event: [1:Trauma] [2:Gradually Appeared] [5:Blister] Primary Etiology: [1:Abrasion] [2:Venous Leg Ulcer] [5:Venous Leg Ulcer] Comorbid History: [1:Cataracts, Chronic Obstructive Pulmonary Disease (COPD), Hypertension, Peripheral Venous Disease] [2:Cataracts, Chronic Obstructive Pulmonary Disease (COPD), Hypertension, Peripheral Hypertension, Peripheral Venous Disease]  [5:Cataracts, Chronic Obstructive Pulmonary Disease (COPD), Venous Disease] Date Acquired: [1:01/10/2019] [2:01/28/2019] [5:04/10/2019] Weeks of Treatment: [1:11] [2:11] [5:1] Wound Status: [1:Open] [2:Open] [5:Open] Clustered Wound: [1:No] [2:Yes] [5:No] Measurements L x W x D 2.5x1.1x0.1 [2:1.5x1.5x0.1] [5:1.5x2x0.1] (cm) Area (cm) : [1:2.16] [2:1.767] [5:2.356] Volume (cm) : [1:0.216] [2:0.177] [5:0.236] % Reduction in Area: [1:83.60%] [2:-275.20%] [5:20.00%] % Reduction in Volume: 94.50% [2:-276.60%] [5:20.00%] Classification:  [1:Full Thickness Without Exposed Support Structures Exposed Support Structures Exposed Support Structures] [2:Full Thickness Without] [5:Full Thickness Without] Exudate Amount: [1:Medium] [2:Medium] [5:Small] Exudate Type: [1:Serosanguineous] [2:Serosanguineous] [5:Serosanguineous] Exudate Color: [1:red, brown] [2:red, brown] [5:red, brown] Wound Margin: [1:Well defined, not attached] [2:Flat and Intact] [5:N/A] Granulation Amount: [1:Medium (34-66%)] [2:Large (67-100%)] [5:Large (67-100%)] Granulation Quality: [1:Pink, Pale] [2:Red, Pink] [5:Red, Pink] Necrotic Amount: [1:Medium (34-66%)] [2:None Present (0%)] [5:N/A] Exposed Structures: [1:Fat Layer (Subcutaneous Tissue) Exposed: Yes Fascia: No Tendon: No Muscle: No Joint: No Bone: No] [2:Fat Layer (Subcutaneous Tissue) Exposed: Yes Fascia: No Tendon: No Muscle: No Joint: No Bone: No] [5:Fat Layer (Subcutaneous Tissue) Exposed: Yes  Fascia: No Tendon: No Muscle: No Joint: No Bone: No] Epithelialization: [1:Small (1-33%)] [2:None] [5:None] Debridement: [1:N/A] [2:N/A] [5:Debridement - Excisional] Pre-procedure [1:N/A] [2:N/A] [5:15:17] Verification/Time Out Taken: Tissue Debrided: [1:N/A] [2:N/A] [5:Necrotic/Eschar, Subcutaneous] Level: [1:N/A] [2:N/A] [5:Skin/Subcutaneous Tissue] Debridement Area (sq cm):N/A [2:N/A] [5:3] Instrument: [1:N/A] [2:N/A] [5:Curette] Bleeding: [1:N/A] [2:N/A] [5:Minimum] Hemostasis Achieved: [1:N/A] [2:N/A] [5:Pressure] Procedural Pain: [1:N/A] [2:N/A] [5:0] Post Procedural Pain: [1:N/A] [2:N/A] [5:0] Debridement Treatment N/A [2:N/A] [5:Procedure was tolerated] Response: [5:well] Post Debridement [1:N/A] [2:N/A] [5:1.5x2x0.1] Measurements L x W x D (cm) Post Debridement [1:N/A] [2:N/A] [5:0.236] Volume: (cm) Procedures Performed: N/A [2:Compression Therapy] [5:Debridement] Treatment Notes Electronic Signature(s) Signed: 04/25/2019 5:25:49 PM By: Linton Ham MD Signed: 04/25/2019 5:46:53 PM By:  Levan Hurst RN, BSN Entered By: Linton Ham on 04/25/2019 17:05:56 -------------------------------------------------------------------------------- Multi-Disciplinary Care Plan Details Patient Name: Date of Service: Crystal Barth 04/25/2019 2:30 PM Medical Record [id number] Patient Account Number: [id number] Date of Birth/Sex: Treating RN: May 30, 1946 (73 y.o. [name] Primary Care Jamin Humphries: Christain [place] Other Clinician: Referring Haisley Arens: Treating Vernella Niznik/Extender:Robson, Glendale Chard, Letta Moynahan in Treatment: 11 Active Inactive Abuse / Safety / Falls / Self Care Management Nursing Diagnoses: History of Falls Potential for falls Goals: Patient will remain injury free related to falls Date Initiated: 02/04/2019 Target Resolution Date: 05/13/2019 Goal Status: Active Patient/caregiver will verbalize/demonstrate measures taken to prevent injury and/or falls Date Initiated: 02/04/2019 Date Inactivated: 03/10/2019 Target Resolution Date: 03/11/2019 Goal Status: Met Interventions: Assess fall risk on admission and as needed Assess: immobility, friction, shearing, incontinence upon admission and as needed Assess personal safety and home safety (as indicated) on admission and as needed Notes: Venous Leg Ulcer Nursing Diagnoses: Actual venous Insuffiency (use after diagnosis is confirmed) Knowledge deficit related to disease process and management Goals: Patient will maintain optimal edema control Date Initiated: 02/04/2019 Target Resolution Date: 05/13/2019 Goal Status: Active Patient/caregiver will verbalize understanding of disease process and disease management Date Initiated: 02/04/2019 Date Inactivated: 03/10/2019 Target Resolution Date: 03/11/2019 Goal Status: Met Interventions: Assess peripheral edema status every visit. Compression as ordered Provide education on venous insufficiency Treatment  Activities: Therapeutic compression applied :  02/04/2019 Notes: Wound/Skin Impairment Nursing Diagnoses: Impaired tissue integrity Knowledge deficit related to ulceration/compromised skin integrity Goals: Patient/caregiver will verbalize understanding of skin care regimen Date Initiated: 02/04/2019 Target Resolution Date: 05/13/2019 Goal Status: Active Ulcer/skin breakdown will have a volume reduction of 30% by week 4 Date Initiated: 02/04/2019 Date Inactivated: 04/07/2019 Target Resolution Date: 03/11/2019 Unmet Reason: see wound Goal Status: Unmet measurements. Interventions: Assess patient/caregiver ability to obtain necessary supplies Assess patient/caregiver ability to perform ulcer/skin care regimen upon admission and as needed Assess ulceration(s) every visit Provide education on ulcer and skin care Treatment Activities: Skin care regimen initiated : 02/04/2019 Topical wound management initiated : 02/04/2019 Notes: Electronic Signature(s) Signed: 04/25/2019 5:46:53 PM By: Levan Hurst RN, BSN Entered By: Levan Hurst on 04/25/2019 14:47:37 -------------------------------------------------------------------------------- Pain Assessment Details Patient Name: Date of Service: Crystal Fray F. 04/25/2019 2:30 PM Medical Record [id number] Patient Account Number: [id number] Date of Birth/Sex: Treating RN: 1945-08-06 (73 y.o. Orvan Falconer Primary Care Aleenah Homen: Christain [place] Other Clinician: Referring Marionna Gonia: Treating Daylyn Azbill/Extender:Robson, Glendale Chard, Letta Moynahan in Treatment: 11 Active Problems Location of Pain Severity and Description of Pain Patient Has Paino No Site Locations Pain Management and Medication Current Pain Management: Electronic Signature(s) Signed: 05/24/2019 2:53:59 PM By: Carlene Coria RN Entered By: Carlene Coria on 04/25/2019 14:54:55 -------------------------------------------------------------------------------- Patient/Caregiver Education Details Patient Name: Date of  Service: Crystal Barth 11/9/2020andnbsp2:30 PM Medical Record [id number] Patient Account Number: [id number] Date of Birth/Gender: Jun 16, 1946 (73 y.o. F) Treating RN: Levan Hurst Primary Care Physician: Christain [place] Other Clinician: Referring Physician: Treating Physician/Extender:Robson, Glendale Chard, Letta Moynahan in Treatment: 11 Education Assessment Education Provided To: Patient Education Topics Provided Wound/Skin Impairment: Methods: Explain/Verbal Responses: State content correctly [employer]) Signed: 04/25/2019 5:46:53 PM By: Levan Hurst RN, BSN Entered By: Levan Hurst on 04/25/2019 14:58:04 -------------------------------------------------------------------------------- Wound Assessment Details Patient Name: Date of Service: Crystal Barth. 04/25/2019 2:30 PM Medical Record [id number] Patient Account Number: [id number] Date of Birth/Sex: Treating RN: 1945-11-01 (73 y.o. Orvan Falconer Primary Care Cree Kunert: Christain [place] Other Clinician: Referring Psalm Schappell: Treating Dannon Nguyenthi/Extender:Robson, Glendale Chard, Letta Moynahan in Treatment: 11 Wound Status Wound Number: 1 Primary Abrasion Etiology: Wound Location: Left Knee - Anterior Wound Open Wounding Event: Trauma Status: Date Acquired: 01/10/2019 Comorbid Cataracts, Chronic Obstructive Pulmonary Weeks Of Treatment: 11 History: Disease (COPD), Hypertension, Peripheral Clustered Wound: No Venous Disease Photos Wound Measurements Length: (cm) 2.5 % Reduct Width: (cm) 1.1 % Reduct Depth: (cm) 0.1 Epitheli Area: (cm) 2.16 Tunneli Volume: (cm) 0.216 Undermi Wound Description Classification: Full Thickness Without Exposed Support Foul Od Structures Slough/ Wound Well defined, not attached Margin: Exudate Medium Amount: Exudate Serosanguineous Type: Exudate red, brown Color: Wound Bed Granulation Amount: Medium (34-66%) Granulation Quality: Pink, Pale Fascia  E Necrotic Amount: Medium (34-66%) Fat Laye Necrotic Quality: Adherent Slough Tendon E Muscle E Joint Ex Bone Exp or After Cleansing: No Fibrino Yes Exposed Structure xposed: No r (Subcutaneous Tissue) Exposed: Yes xposed: No xposed: No posed: No osed: No ion in Area: 83.6% ion in Volume: 94.5% alization: Small (1-33%) ng: No ning: No Electronic Signature(s) Signed: 04/27/2019 4:27:20 PM By: Mikeal Hawthorne EMT/HBOT Signed: 05/24/2019 2:53:59 PM By: Carlene Coria RN Entered By: Mikeal Hawthorne on 04/27/2019 10:52:55 -------------------------------------------------------------------------------- Wound Assessment Details Patient Name: Date of Service: Crystal Fray F. 04/25/2019 2:30 PM Medical Record [id number] Patient Account Number: [id number] Date of Birth/Sex: Treating RN: 02-25-1946 (73 y.o. Orvan Falconer Primary Care Seidy Labreck: Christain [place] Other Clinician:  Referring Erynne Kealey: Treating Joletta Manner/Extender:Robson, Glendale Chard, Jama Flavors Weeks in Treatment: 11 Wound Status Wound Number: 2 Primary Venous Leg Ulcer Etiology: Wound Location: Right Lower Leg - Anterior Wound Open Wounding Event: Gradually Appeared Status: Date Acquired: 01/28/2019 Comorbid Cataracts, Chronic Obstructive Pulmonary Weeks Of Treatment: 11 History: Disease (COPD), Hypertension, Peripheral Clustered Wound: Yes Venous Disease Photos Wound Measurements Length: (cm) 1.5 % Reduct Width: (cm) 1.5 % Reduct Depth: (cm) 0.1 Epitheli Area: (cm) 1.767 Tunneli Volume: (cm) 0.177 Undermi Wound Description Classification: Full Thickness Without Exposed Support Foul Odo Structures Slough/F Wound Flat and Intact Margin: Exudate Medium Amount: Exudate Serosanguineous Type: Exudate red, brown Color: Wound Bed Granulation Amount: Large (67-100%) Granulation Quality: Red, Pink Fascia E Necrotic Amount: None Present (0%) Fat Laye Tendon E Muscle E Joint Ex Bone Exp r After  Cleansing: No ibrino No Exposed Structure xposed: No r (Subcutaneous Tissue) Exposed: Yes xposed: No xposed: No posed: No osed: No ion in Area: -275.2% ion in Volume: -276.6% alization: None ng: No ning: No Electronic Signature(s) Signed: 04/27/2019 4:27:20 PM By: Mikeal Hawthorne EMT/HBOT Signed: 05/24/2019 2:53:59 PM By: Carlene Coria RN Entered By: Mikeal Hawthorne on 04/27/2019 11:07:40 -------------------------------------------------------------------------------- Wound Assessment Details Patient Name: Date of Service: Crystal Barth. 04/25/2019 2:30 PM Medical Record [id number] Patient Account Number: [id number] Date of Birth/Sex: Treating RN: 1946/01/03 (73 y.o. Orvan Falconer Primary Care Nuri Larmer: Christain [place] Other Clinician: Referring Trena Dunavan: Treating Dayvon Dax/Extender:Robson, Glendale Chard, Jama Flavors Weeks in Treatment: 11 Wound Status Wound Number: 5 Primary Venous Leg Ulcer Etiology: Wound Location: Right Lower Leg - Lateral, Proximal Wound Open Wounding Event: Blister Status: Date Acquired: 04/10/2019 Comorbid Cataracts, Chronic Obstructive Pulmonary Weeks Of Treatment: 1 History: Disease (COPD), Hypertension, Peripheral Clustered Wound: No Venous Disease Photos Wound Measurements Length: (cm) 1.5 % Reduc Width: (cm) 2 % Reduc Depth: (cm) 0.1 Epithel Area: (cm) 2.356 Tunnel Volume: (cm) 0.236 Underm Wound Description Classification: Full Thickness Without Exposed Support Structures Exudate Small Amount: Exudate Serosanguineous Type: Exudate red, brown Color: Wound Bed Granulation Amount: Large (67-100%) Granulation Quality: Red, Pink Foul Odor After Cleansing: No Slough/Fibrino No Exposed Structure Fascia Exposed: No Fat Layer (Subcutaneous Tissue) Exposed: Yes Tendon Exposed: No Muscle Exposed: No Joint Exposed: No Bone Exposed: No tion in Area: 20% tion in Volume: 20% ialization: None ing: No ining: No Electronic  Signature(s) Signed: 04/27/2019 4:27:20 PM By: Mikeal Hawthorne EMT/HBOT Signed: 05/24/2019 2:53:59 PM By: Carlene Coria RN Entered By: Mikeal Hawthorne on 04/27/2019 11:08:01 -------------------------------------------------------------------------------- Vitals Details Patient Name: Date of Service: Crystal Fray F. 04/25/2019 2:30 PM Medical Record [id number] Patient Account Number: [id number] Date of Birth/Sex: Treating RN: 1945/07/11 (73 y.o. Orvan Falconer Primary Care Eliazer Hemphill: Christain [place] Other Clinician: Referring Hatsuko Bizzarro: Treating Lannie Heaps/Extender:Robson, Glendale Chard, Letta Moynahan in Treatment: 11 Vital Signs Time Taken: 14:54 Temperature (F): 98.9 Height (in): 62 Pulse (bpm): 103 Weight (lbs): 112 Respiratory Rate (breaths/min): 18 Body Mass Index (BMI): 20.5 Blood Pressure (mmHg): 163/90 Reference Range: 80 - 120 mg / dl Electronic Signature(s) Signed: 05/24/2019 2:53:59 PM By: Carlene Coria RN Entered By: Carlene Coria on 04/25/2019 14:54:47

## 2019-05-24 NOTE — Progress Notes (Signed)
Crystal, [name] ([id number]) Visit Report for 05/23/2019 Debridement Details Patient Name: Date of Service: Crystal, [name] 05/23/2019 2:00 PM Medical Record [id number] Patient Account Number: [id number] Date of Birth/Sex: Treating RN: [birth year] (73 y.o. F) Primary Care Provider: Christain [name] Other Clinician: Referring Provider: Treating Provider/Extender:Bradon Fester, Glendale Chard, Letta Moynahan in Treatment: 15 Debridement Performed for Wound #1 Left,Anterior Knee Assessment: Performed By: Physician Ricard Dillon., MD Debridement Type: Debridement Level of Consciousness (Pre- Awake and Alert procedure): Pre-procedure Yes - 15:22 Verification/Time Out Taken: Start Time: 15:22 Pain Control: Other : Benzocaine 20% Total Area Debrided (L x W): 2 (cm) x 1.5 (cm) = 3 (cm) Tissue and other material Viable, Non-Viable, Slough, Subcutaneous, Slough debrided: Level: Skin/Subcutaneous Tissue Debridement Description: Excisional Instrument: Curette Bleeding: Minimum Hemostasis Achieved: Pressure End Time: 15:23 Procedural Pain: 0 Post Procedural Pain: 0 Response to Treatment: Procedure was tolerated well Level of Consciousness Awake and Alert (Post-procedure): Post Debridement Measurements of Total Wound Length: (cm) 2 Width: (cm) 1.5 Depth: (cm) 0.1 Volume: (cm) 0.236 Character of Wound/Ulcer Post Improved Debridement: Post Procedure Diagnosis Same as Pre-procedure Electronic Signature(s) Signed: 05/24/2019 10:01:17 AM By: Linton Ham MD Entered By: Linton Ham on 05/23/2019 15:35:26 -------------------------------------------------------------------------------- HPI Details Patient Name: Date of Service: Crystal Fray F. 05/23/2019 2:00 PM Medical Record [id number] Patient Account Number: [id number] Date of Birth/Sex: Treating RN: 1945/10/01 (73 y.o. 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 6 weeks ago she had a fall and suffered a contusion on her left anterior patella area. She saw her primary doctor she has been prescribed Silvadene cream and cephalexin on 8/7 for 1 week. The area is not progressing towards closure and she is here for our review of this. Incidentally we were able to also discover an area on the right anterior lower tibial area which is a small wound that we think may have been there for about 1 week. The patient saw Dr. early on 11/23/2018. He did not think the patient had any arterial issues based on triphasic waveforms with his hand-held Dopplers. She had venous reflux studies that showed abnormal reflux times in the common femoral vein, great saphenous vein in the distal thigh great saphenous vein at the knee and great saphenous vein at the mid calf similar findings on the left but he did not think that she was a candidate for ablation stating that she had mild reflux in the great saphenous vein proximally but no dilation. There was no DVT Past medical history includes COPD, pulmonary hypertension, coronary disease, hypertension, chronic lower extremity edema ABIs in our clinic were 1.02 on the right and 0.86 on the left 8/28; patient readmitted to the clinic last week. Traumatic wound on the left anterior patella in the setting of fairly significant chronic venous changes. She also has a small area on the right anterior mid tibia area. 9/4; traumatic wound on the left anterior patella. We have been using Medihoney as the patient could not afford Santyl. This is really not doing improving things. We moved to Iodoflex today. Also unfortunately the area on the right anterior mid tibia is not healed 9/24;  traumatic wound on the left anterior patella had a greenish drainage on the dressing. This is led to a culture being  done of this area. I changed to Iodoflex last time this may have something to do with the color. The area on the right anterior tibia is actually larger. 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 This looks somewhat better. He has an area just below this on the left which is superficial. Finally she has the area on the right anterior pretibial area which looks better and more superficial. Extremely damaged skin on the bilateral lower extremities I think which is mostly secondary to longstanding lymphedema. She does not want the right leg wrapped largely because she is developing fluid above the wraps just below her knee and in the thigh. I am concerned about not wrapping it however she has made her wishes clear 10/8; traumatic wound on the left anterior patella is smaller. We have been using silver alginate. She has 2 closely opposed wounds in the right anterior mid tibia. Some swelling and erythema in this area but no tenderness we have been using silver alginate here as well. She will not allow compression wraps. She has a compression stocking for the right lower leg 10/22; patient is not been here in 2 weeks. She has a injury on the left anterior patellar tendon area from a fall. She has broken down areas anteriorly on the right and today right lateral from severe venous inflammation with secondary lymphedema. She has not previously agreed to compression wraps. She has juxta lite stockings although these were not applied properly today. I am not clear what she has been putting on the wounds. She complains of a lot of pain on the right lateral calf 10/26; severe bilateral chronic venous hypertension with inflammation/stasis  dermatitis and lymphedema. She finally let us wrap her right leg last week and things look a lot better although I did give her empiric Keflex. A lot less swelling a lot less inflammation in the right leg. She has been using her juxta lite on the left. She has 2 small open areas on the right leg to remain we have been using silver alginate 11/2; severe bilateral chronic venous hypertension with inflammation and stasis dermatitis and lymphedema. The skin on her right leg actually looks better. Still a small open area anteriorly. On the left she has the inner part of her juxta lite stocking the edema was not well proportioned. She was cautioned to use the external wrap portion of this as well. The area on the left is on the anterior patella. This looks like it is closing down some using silver 11/9; severe bilateral chronic venous hypertension with inflammation stasis dermatitis and lymphedema. Her edema control is better the degree of inflammation in her skin is better. She has remaining wounds on the right mid tibia, new area on the right lateral upper tibia and then the original area on her knee. We have been using silver alginate however the wounds look dry I have changed to silver collagen today. 11/16; severe bilateral chronic venous hypertension with inflammation/stasis dermatitis and lymphedema. Her edema control is much better and the skin on her lower legs looks better. She has remaining wounds in the right mid tibia right lateral upper tibia and the original area on her left patella. Silver collagen to the wounds that I started last week. 11/30; patient arrives today with the 1 remaining area on the left anterior patella very dry and covered and adherent debris we have been  using moistened silver collagen. The area on the right mid tibia has rolled edges that are concerning for a malignancy. 12/7; the left anterior patella again requires debridement however has 1 small open area  remaining. The area on the right mid tibia that I biopsied last week shows basal cell carcinoma nodular and infiltrating patterns. She has a small area on the right lateral upper calf just below the fibular head Electronic Signature(s) Signed: 05/24/2019 10:01:17 AM By: Linton Ham MD Entered By: Linton Ham on 05/23/2019 15:36:25 -------------------------------------------------------------------------------- Physical Exam Details Patient Name: Date of Service: Crystal Fray F. 05/23/2019 2:00 PM Medical Record [id number] Patient Account Number: [id number] Date of Birth/Sex: Treating RN: 04-16-1946 (73 y.o. F) Primary Care Provider: Christain [name] Other Clinician: Referring Provider: Treating Provider/Extender:Jarnell Cordaro, Glendale Chard, Letta Moynahan in Treatment: 15 Constitutional Patient is hypertensive.. Pulse regular and within target range for patient.Marland Kitchen Respirations regular, non-labored and within target range.. Temperature is normal and within the target range for the patient.Marland Kitchen Appears in no distress. Notes Wound exam; left anterior patella. The vast majority of this is closed however still a nonviable surface over about 50% of the wound. Using #5 curette this was removed to reveal a healthy granulated surface Right anterior mid tibia biopsy last week was a basal cell carcinoma. Small area on the right lateral upper calf just below the fibular head Electronic Signature(s) Signed: 05/24/2019 10:01:17 AM By: Linton Ham MD Entered By: Linton Ham on 05/23/2019 15:37:28 -------------------------------------------------------------------------------- Physician Orders Details Patient Name: Date of Service: Crystal Barth. 05/23/2019 2:00 PM Medical Record [id number] Patient Account Number: [id number] Date of Birth/Sex: Treating RN: Jul 27, 1945 (73 y.o. [name] Primary Care Provider: Christain [name] Other Clinician: Referring Provider: Treating  Provider/Extender:Leeya Rusconi, Glendale Chard, Letta Moynahan in Treatment: 15 Verbal / Phone Orders: No Diagnosis Coding ICD-10 Coding Code Description S80.02XD Contusion of left knee, subsequent encounter L97.811 Non-pressure chronic ulcer of other part of right lower leg limited to breakdown of skin I87.323 Chronic venous hypertension (idiopathic) with inflammation of bilateral lower extremity I89.0 Lymphedema, not elsewhere classified Follow-up Appointments Return Appointment in 1 week. Dressing Change Frequency Wound #1 Left,Anterior Knee Change Dressing every other day. Wound #2 Right,Anterior Lower Leg Do not change entire dressing for one week. Wound #5 Right,Proximal,Lateral Lower Leg Do not change entire dressing for one week. Skin Barriers/Peri-Wound Care Moisturizing lotion - patient to apply lotion to left leg at night. TCA Cream or Ointment - liberally with lotion to both legs in clinic. Wound Cleansing Wound #1 Left,Anterior Knee May shower and wash wound with soap and water. Wound #2 Right,Anterior Lower Leg May shower with protection. Primary Wound Dressing Wound #1 Left,Anterior Knee Collagen - moisten with hydrogel (or KY jelly) Wound #2 Right,Anterior Lower Leg Foam Wound #5 Right,Proximal,Lateral Lower Leg Collagen - moisten with hydrogel (or KY jelly) Secondary Dressing Wound #1 Left,Anterior Knee Foam Border Wound #5 Right,Proximal,Lateral Lower Leg Foam Border Edema Control 3 Layer Compression System - Right Lower Extremity Avoid standing for long periods of time Elevate legs to the level of the heart or above for 30 minutes daily and/or when sitting, a frequency of: - throughout the day Exercise regularly Support Garment 30-40 mm/Hg pressure to: - patient to wear juxtalite HD on left leg. Apply in the am and remove at night. Patient to ensure to measure how tight the compression is daily. Please show patient how to apply  juxtalite. Consults Dermatology [facility] D[facility]at[facility]) - ASAP - Basal Cell Carcinoma right anterior lower  leg, multiple other atypical lesions on bilateral legs and thighs - (ICD10 L97.811 - Non-pressure chronic ulcer of other part of right lower leg limited to breakdown of skin) Electronic Signature(s) Signed: 05/23/2019 5:51:44 PM By: Levan Hurst RN, BSN Signed: 05/24/2019 10:01:17 AM By: Linton Ham MD Entered By: Levan Hurst on 05/23/2019 15:30:09 -------------------------------------------------------------------------------- Prescription 05/23/2019 Patient Name: Crystal Barth. Provider: Linton Ham MD Date of Birth: 1945/12/30 NPI#: [id number] Sex: F DEA#: [id number] Phone #: [phone] License #: [id number] Patient Address: [place] [address] [place], [place] [address] [place], [place] [address] [phone] Allergies tramadol Reaction: GI upset Severity: Moderate Provider's Orders Dermatology [facility] Dermatology) - ICD10: B86.754 - ASAP - Basal Cell Carcinoma right anterior lower leg, multiple other atypical lesions on bilateral legs and thighs Signature(s): Date(s): Electronic Signature(s) Signed: 05/23/2019 5:51:44 PM By: Levan Hurst RN, BSN Signed: 05/24/2019 10:01:17 AM By: Linton Ham MD Entered By: Levan Hurst on 05/23/2019 15:30:12 --------------------------------------------------------------------------------  Problem List Details Patient Name: Date of Service: Crystal Barth. 05/23/2019 2:00 PM Medical Record [id number] Patient Account Number: [id number] Date of Birth/Sex: Treating RN: 08-06-1945 (73 y.o. [name] Primary Care Provider: Christain [name] Other Clinician: Referring Provider: Treating Provider/Extender:Khiara Shuping, Glendale Chard, Letta Moynahan in Treatment: 15 Active Problems ICD-10 Evaluated Encounter Code Description Active Date  Today Diagnosis S80.02XD Contusion of left knee, subsequent encounter 02/04/2019 No Yes L97.811 Non-pressure chronic ulcer of other part of right lower 03/24/2019 No Yes leg limited to breakdown of skin I87.323 Chronic venous hypertension (idiopathic) with 02/04/2019 No Yes inflammation of bilateral lower extremity I89.0 Lymphedema, not elsewhere classified 02/04/2019 No Yes Inactive Problems ICD-10 Code Description Active Date Inactive Date L97.511 Non-pressure chronic ulcer of other part of right foot limited to 02/11/2019 02/11/2019 breakdown of skin Resolved Problems Electronic Signature(s) Signed: 05/24/2019 10:01:17 AM By: Linton Ham MD Entered By: Linton Ham on 05/23/2019 15:35:01 -------------------------------------------------------------------------------- Progress Note Details Patient Name: Date of Service: Crystal Barth. 05/23/2019 2:00 PM Medical Record [id number] Patient Account Number: [id number] Date of Birth/Sex: Treating RN: Apr 06, 1946 (73 y.o. F) Primary Care Provider: Christain [name] Other Clinician: Referring Provider: Treating Provider/Extender:Traveon Louro, Glendale Chard, Letta Moynahan in Treatment: 15 Subjective History of Present Illness (HPI) ADMISSION 02/04/2019 Mrs. 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 6 weeks ago she had a fall and suffered a contusion on her left anterior patella area. She saw her primary doctor she has been prescribed Silvadene cream and cephalexin on 8/7 for 1 week. The area is not progressing towards closure and she is here for our review of this. Incidentally we were able to also discover an area on the right anterior lower tibial area which is a small wound that we think may have been there for about 1 week. The patient saw Dr. early on 11/23/2018. He did not think the patient had any arterial issues based on triphasic waveforms with his  hand-held Dopplers. She had venous reflux studies that showed abnormal reflux times in the common femoral vein, great saphenous vein in the distal thigh great saphenous vein at the knee and great saphenous vein at the mid calf similar findings on the left but he did not think that she was a candidate for ablation stating that she had mild reflux in the great saphenous vein proximally but no dilation. There was no DVT Past medical history includes COPD,  pulmonary hypertension, coronary disease, hypertension, chronic lower extremity edema ABIs in our clinic were 1.02 on the right and 0.86 on the left 8/28; patient readmitted to the clinic last week. Traumatic wound on the left anterior patella in the setting of fairly significant chronic venous changes. She also has a small area on the right anterior mid tibia area. 9/4; traumatic wound on the left anterior patella. We have been using Medihoney as the patient could not afford Santyl. This is really not doing improving things. We moved to Iodoflex today. Also unfortunately the area on the right anterior mid tibia is not healed 9/24; traumatic wound on the left anterior patella had a greenish drainage on the dressing. This is led to a culture being done of this area. I changed to Iodoflex last time this may have something to do with the color. The area on the right anterior tibia is actually larger. 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 This looks somewhat better. He has an area just below this on the left which is superficial. Finally she has the area on the right anterior pretibial area which looks better and more superficial. Extremely damaged skin on the bilateral lower extremities I think which is mostly secondary to longstanding lymphedema. She does not want the right leg wrapped  largely because she is developing fluid above the wraps just below her knee and in the thigh. I am concerned about not wrapping it however she has made her wishes clear 10/8; traumatic wound on the left anterior patella is smaller. We have been using silver alginate. She has 2 closely opposed wounds in the right anterior mid tibia. Some swelling and erythema in this area but no tenderness we have been using silver alginate here as well. She will not allow compression wraps. She has a compression stocking for the right lower leg 10/22; patient is not been here in 2 weeks. She has a injury on the left anterior patellar tendon area from a fall. She has broken down areas anteriorly on the right and today right lateral from severe venous inflammation with secondary lymphedema. She has not previously agreed to compression wraps. She has juxta lite stockings although these were not applied properly today. I am not clear what she has been putting on the wounds. She complains of a lot of pain on the right lateral calf 10/26; severe bilateral chronic venous hypertension with inflammation/stasis dermatitis and lymphedema. She finally let us wrap her right leg last week and things look a lot better although I did give her empiric Keflex. A lot less swelling a lot less inflammation in the right leg. She has been using her juxta lite on the left. She has 2 small open areas on the right leg to remain we have been using silver alginate 11/2; severe bilateral chronic venous hypertension with inflammation and stasis dermatitis and lymphedema. The skin on her right leg actually looks better. Still a small open area anteriorly. ooOn the left she has the inner part of her juxta lite stocking the edema was not well proportioned. She was cautioned to use the external wrap portion of this as well. The area on the left is on the anterior patella. This looks like it is closing down some using silver 11/9; severe  bilateral chronic venous hypertension with inflammation stasis dermatitis and lymphedema. Her edema control is better the degree of inflammation in  her skin is better. She has remaining wounds on the right mid tibia, new area on the right lateral upper tibia and then the original area on her knee. We have been using silver alginate however the wounds look dry I have changed to silver collagen today. 11/16; severe bilateral chronic venous hypertension with inflammation/stasis dermatitis and lymphedema. Her edema control is much better and the skin on her lower legs looks better. She has remaining wounds in the right mid tibia right lateral upper tibia and the original area on her left patella. Silver collagen to the wounds that I started last week. 11/30; patient arrives today with the 1 remaining area on the left anterior patella very dry and covered and adherent debris we have been using moistened silver collagen. The area on the right mid tibia has rolled edges that are concerning for a malignancy. 12/7; the left anterior patella again requires debridement however has 1 small open area remaining. The area on the right mid tibia that I biopsied last week shows basal cell carcinoma nodular and infiltrating patterns. She has a small area on the right lateral upper calf just below the fibular head Objective Constitutional Patient is hypertensive.. Pulse regular and within target range for patient.Marland Kitchen Respirations regular, non-labored and within target range.. Temperature is normal and within the target range for the patient.Marland Kitchen Appears in no distress. Vitals Time Taken: 2:30 PM, Height: 62 in, Weight: 112 lbs, BMI: 20.5, Temperature: 99.3 F, Pulse: 113 bpm, Respiratory Rate: 19 breaths/min, Blood Pressure: 146/84 mmHg. General Notes: Wound exam; left anterior patella. The vast majority of this is closed however still a nonviable surface over about 50% of the wound. Using #5 curette this was removed  to reveal a healthy granulated surface ooRight anterior mid tibia biopsy last week was a basal cell carcinoma. ooSmall area on the right lateral upper calf just below the fibular head Integumentary (Hair, Skin) Wound #1 status is Open. Original cause of wound was Trauma. The wound is located on the Left,Anterior Knee. The wound measures 2cm length x 1.5cm width x 0.1cm depth; 2.356cm^2 area and 0.236cm^3 volume. There is Fat Layer (Subcutaneous Tissue) Exposed exposed. There is no tunneling or undermining noted. There is a medium amount of serosanguineous drainage noted. The wound margin is distinct with the outline attached to the wound base. There is medium (34-66%) red, pink granulation within the wound bed. There is a medium (34-66%) amount of necrotic tissue within the wound bed including Adherent Slough. Wound #2 status is Open. Original cause of wound was Gradually Appeared. The wound is located on the Right,Anterior Lower Leg. The wound measures 1.5cm length x 2cm width x 0.1cm depth; 2.356cm^2 area and 0.236cm^3 volume. There is Fat Layer (Subcutaneous Tissue) Exposed exposed. There is no tunneling or undermining noted. There is a medium amount of serosanguineous drainage noted. The wound margin is distinct with the outline attached to the wound base. There is large (67-100%) red, pink, hyper - granulation within the wound bed. There is a small (1-33%) amount of necrotic tissue within the wound bed including Adherent Slough. Wound #5 status is Open. Original cause of wound was Blister. The wound is located on the Right,Proximal,Lateral Lower Leg. The wound measures 0.2cm length x 0.4cm width x 0.1cm depth; 0.063cm^2 area and 0.006cm^3 volume. There is no tunneling or undermining noted. There is a small amount of serous drainage noted. The wound margin is distinct with the outline attached to the wound base. There is medium (34-66%) red, pink  granulation within the wound bed. There is a  medium (34-66%) amount of necrotic tissue within the wound bed including Adherent Slough. Assessment Active Problems ICD-10 Contusion of left knee, subsequent encounter Non-pressure chronic ulcer of other part of right lower leg limited to breakdown of skin Chronic venous hypertension (idiopathic) with inflammation of bilateral lower extremity Lymphedema, not elsewhere classified Procedures Wound #1 Pre-procedure diagnosis of Wound #1 is an Abrasion located on the Left,Anterior Knee . There was a Excisional Skin/Subcutaneous Tissue Debridement with a total area of 3 sq cm performed by Ricard Dillon., MD. With the following instrument(s): Curette to remove Viable and Non-Viable tissue/material. Material removed includes Subcutaneous Tissue and Slough and after achieving pain control using Other (Benzocaine 20%). No specimens were taken. A time out was conducted at 15:22, prior to the start of the procedure. A Minimum amount of bleeding was controlled with Pressure. The procedure was tolerated well with a pain level of 0 throughout and a pain level of 0 following the procedure. Post Debridement Measurements: 2cm length x 1.5cm width x 0.1cm depth; 0.236cm^3 volume. Character of Wound/Ulcer Post Debridement is improved. 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 There was a Three Layer Compression Therapy Procedure by Levan Hurst, RN. 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 There was a Three Layer Compression Therapy Procedure by Levan Hurst, RN. Post procedure Diagnosis Wound #5: Same as Pre-Procedure Plan Follow-up Appointments: Return Appointment in 1 week. Dressing Change Frequency: Wound #1 Left,Anterior Knee: Change  Dressing every other day. Wound #2 Right,Anterior Lower Leg: Do not change entire dressing for one week. Wound #5 Right,Proximal,Lateral Lower Leg: Do not change entire dressing for one week. Skin Barriers/Peri-Wound Care: Moisturizing lotion - patient to apply lotion to left leg at night. TCA Cream or Ointment - liberally with lotion to both legs in clinic. Wound Cleansing: Wound #1 Left,Anterior Knee: May shower and wash wound with soap and water. Wound #2 Right,Anterior Lower Leg: May shower with protection. Primary Wound Dressing: Wound #1 Left,Anterior Knee: Collagen - moisten with hydrogel (or KY jelly) Wound #2 Right,Anterior Lower Leg: Foam Wound #5 Right,Proximal,Lateral Lower Leg: Collagen - moisten with hydrogel (or KY jelly) Secondary Dressing: Wound #1 Left,Anterior Knee: Foam Border Wound #5 Right,Proximal,Lateral Lower Leg: Foam Border Edema Control: 3 Layer Compression System - Right Lower Extremity Avoid standing for long periods of time Elevate legs to the level of the heart or above for 30 minutes daily and/or when sitting, a frequency of: - throughout the day Exercise regularly Support Garment 30-40 mm/Hg pressure to: - patient to wear juxtalite HD on left leg. Apply in the am and remove at night. Patient to ensure to measure how tight the compression is daily. Please show patient how to apply juxtalite. Consults ordered were: Dermatology [facility]) - ASAP - Basal Cell Carcinoma right anterior lower leg, multiple other atypical lesions on bilateral legs and thighs 1. Continuing with moistened collagen to the left anterior patella as well as the right proximal lateral lower leg 2. We will simply keep a foam border over the right mid tibial area 3. The patient has multiple hyperkeratotic nodules especially on her thighs. Some of these could be malignant. 4. We are going to refer her to dermatology Electronic Signature(s) Signed: 05/23/2019  5:51:44 PM By: Levan Hurst RN, BSN Signed: 05/24/2019 10:01:17 AM By: Linton Ham MD Entered By: Levan Hurst on 05/23/2019 15:39:42 -------------------------------------------------------------------------------- [place] Details Patient Name: Date of Service: JEANENNE, LICEA 05/23/2019 Medical Record [id number] Patient Account Number: [id number] Date of Birth/Sex: Treating RN: Sep 01, 1945 (73 y.o. F) Primary Care Provider: Christain [name] Other Clinician: Referring Provider: Treating Provider/Extender:Elyzabeth Goatley, [place] Chard, Jama Flavors Weeks in Treatment: 15 Diagnosis Coding ICD-10 Codes Code Description S80.02XD Contusion of left knee, subsequent encounter L97.811 Non-pressure chronic ulcer of other part of right lower leg limited to breakdown of skin I87.323 Chronic venous hypertension (idiopathic) with inflammation of bilateral lower extremity I89.0 Lymphedema, not elsewhere classified Facility Procedures The patient participates with Medicare or their insurance follows the Medicare Facility Guidelines: CPT4 Code Description Modifier Quantity [id number] 11042 - DEB SUBQ TISSUE 20 SQ CM/< 1 ICD-10 Diagnosis Description S80.02XD Contusion of left knee,  subsequent encounter The patient participates with Medicare or their insurance follows the Medicare Facility Guidelines: [phone] (Facility Use Only) [phone] - [place] [facility] [address] Physician Procedures CPT4 Code: 5573220 Description: 25427 - WC PHYS SUBQ TISS 20 SQ CM ICD-10 Diagnosis Description S80.02XD Contusion of left knee, subsequent encounter Modifier: Quantity: 1 Electronic Signature(s) Signed: 05/23/2019 5:51:44 PM By: Levan Hurst RN, BSN Signed: 05/24/2019 10:01:17 AM By: Linton Ham MD Entered By: Levan Hurst on 05/23/2019 17:43:42

## 2019-05-25 NOTE — Progress Notes (Signed)
[name] ([id number]) Visit Report for 05/23/2019 Arrival Information Details Patient Name: Date of Service: [name] 05/23/2019 2:00 PM Medical Record [id number] Patient Account Number: [id number] Date of Birth/Sex: Treating RN: 09/25/1945 (73 y.o. [name] Primary Care Provider: Christain [place] Other Clinician: Referring Provider: Treating Provider/Extender:Robson, Glendale Chard, Letta Moynahan in Treatment: 15 Visit Information History Since Last Visit Wheel Chair All ordered tests and consults were completed: Yes Patient Arrived: Added or deleted any medications: No Arrival Time: 14:32 Any new allergies or adverse reactions: No Accompanied By: family member Had a fall or experienced change in No activities of daily living that may affect Transfer Assistance: None risk of falls: Patient Identification Verified: Yes Signs or symptoms of abuse/neglect since last No Secondary Verification Process Yes visito Completed: Hospitalized since last visit: No Patient Requires Transmission-Based No Implantable device outside of the clinic excluding No Precautions: cellular tissue based products placed in the center Patient Has Alerts: No since last visit: Has Dressing in Place as Prescribed: Yes Has Compression in Place as Prescribed: Yes Pain Present Now: No Electronic Signature(s) Signed: 05/25/2019 12:04:56 PM By: Kela Millin Entered By: Kela Millin on 05/23/2019 14:32:53 -------------------------------------------------------------------------------- Compression Therapy Details Patient Name: Date of Service: Crystal Barth. 05/23/2019 2:00 PM Medical Record [id number] Patient Account Number: [id number] Date of Birth/Sex: Treating RN: 06/18/1945 (73 y.o. [name] Primary Care Provider: Christain [place] Other Clinician: Referring Provider: Treating Provider/Extender:Robson, Glendale Chard, Letta Moynahan in Treatment:  15 Compression Therapy Performed for Wound Wound #5 Right,Proximal,Lateral Lower Leg Assessment: Performed By: Clinician Levan Hurst, RN Compression Type: Three Layer Post Procedure Diagnosis Same as Pre-procedure Electronic Signature(s) Signed: 05/23/2019 5:51:44 PM By: Levan Hurst RN, BSN Entered By: Levan Hurst on 05/23/2019 15:39:30 -------------------------------------------------------------------------------- Compression Therapy Details Patient Name: Date of Service: [name], [name] 05/23/2019 2:00 PM Medical Record [id number] Patient Account Number: [id number] Date of Birth/Sex: Treating RN: 17-Apr-1946 (73 y.o. [name] Primary Care Provider: Christain [place] Other Clinician: Referring Provider: Treating Provider/Extender:Robson, Glendale Chard, Letta Moynahan in Treatment: 15 Compression Therapy Performed for Wound Wound #2 Right,Anterior Lower Leg Assessment: Performed By: Clinician Levan Hurst, RN Compression Type: Three Layer Post Procedure Diagnosis Same as Pre-procedure Electronic Signature(s) Signed: 05/23/2019 5:51:44 PM By: Levan Hurst RN, BSN Entered By: Levan Hurst on 05/23/2019 15:39:30 -------------------------------------------------------------------------------- Encounter Discharge Information Details Patient Name: Date of Service: Crystal Barth. 05/23/2019 2:00 PM Medical Record [id number] Patient Account Number: [id number] Date of Birth/Sex: Treating RN: [birth year] (73 y.o. [name] Primary Care Provider: Christain [place] Other Clinician: Referring Provider: Treating Provider/Extender:Robson, Glendale Chard, Letta Moynahan in Treatment: 15 Encounter Discharge Information Items Post Procedure Vitals Discharge Condition: Stable Temperature (F): 99.3 Ambulatory Status: Wheelchair Pulse (bpm): 113 Discharge Destination: Home Respiratory Rate (breaths/min): 19 Transportation: Private Auto Blood Pressure  (mmHg): 146/84 Accompanied By: friend Schedule Follow-up Appointment: Yes Clinical Summary of Care: Electronic Signature(s) Signed: 05/23/2019 5:20:01 PM By: Deon Pilling Entered By: Deon Pilling on 05/23/2019 15:49:31 -------------------------------------------------------------------------------- Lower Extremity Assessment Details Patient Name: Date of Service: [name] 05/23/2019 2:00 PM Medical Record [id number] Patient Account Number: [id number] Date of Birth/Sex: Treating RN: [birth year] (73 y.o. [name] Primary Care Provider: Christain [place] Other Clinician: Referring Provider: Treating Provider/Extender:Robson, Glendale Chard, Jama Flavors Weeks in Treatment: 15 Edema Assessment Assessed: [Left: No] [Right: Yes] Edema: [Left: Ye] [Right: s] Calf Left: Right: Point of Measurement: 29 cm From Medial Instep cm 25 cm Ankle Left: Right: Point  of Measurement: 9 cm From Medial Instep cm 20.5 cm Vascular Assessment Pulses: Dorsalis Pedis Palpable: [Right:Yes] Electronic Signature(s) Signed: 05/25/2019 12:04:56 PM By: Kela Millin Entered By: Kela Millin on 05/23/2019 14:37:47 -------------------------------------------------------------------------------- Multi Wound Chart Details Patient Name: Date of Service: Crystal Barth. 05/23/2019 2:00 PM Medical Record [id number] Patient Account Number: [id number] Date of Birth/Sex: Treating RN: 09-Mar-1946 (74 y.o. F) Primary Care Provider: Christain [place] Other Clinician: Referring Provider: Treating Provider/Extender:Robson, Glendale Chard, Letta Moynahan in Treatment: 15 Vital Signs Height(in): 5 Pulse(bpm): 113 Weight(lbs): 112 Blood Pressure(mmHg): 146/84 Body Mass Index(BMI): 20 Temperature(F): 99.3 Respiratory 19 Rate(breaths/min): Photos: [1:No Photos] [2:No Photos] [5:No Photos] Wound Location: [1:Left Knee - Anterior] [2:Right Lower Leg - Anterior Right Lower Leg - Lateral,]  [5:Proximal] Wounding Event: [1:Trauma] [2:Gradually Appeared] [5:Blister] Primary Etiology: [1:Abrasion] [2:Venous Leg Ulcer] [5:Venous Leg Ulcer] Comorbid History: [1:Cataracts, Chronic Obstructive Pulmonary Disease (COPD), Hypertension, Peripheral Venous Disease] [2:Cataracts, Chronic Obstructive Pulmonary Disease (COPD), Hypertension, Peripheral Hypertension, Peripheral Venous Disease]  [5:Cataracts, Chronic Obstructive Pulmonary Disease (COPD), Venous Disease] Date Acquired: [1:01/10/2019] [2:01/28/2019] [5:04/10/2019] Weeks of Treatment: [1:15] [2:15] [5:5] Wound Status: [1:Open] [2:Open] [5:Open] Clustered Wound: [1:No] [2:Yes] [5:Yes] Clustered Quantity: [1:N/A] [2:N/A] [5:2] Measurements L x W x D 2x1.5x0.1 [2:1.5x2x0.1] [5:0.2x0.4x0.1] (cm) Area (cm) : [1:2.356] [2:2.356] [5:0.063] Volume (cm) : [1:0.236] [2:0.236] [5:0.006] % Reduction in Area: [1:82.10%] [2:-400.20%] [5:97.90%] % Reduction in Volume: 94.00% [2:-402.10%] [5:98.00%] Classification: [1:Full Thickness Without Exposed Support Structures Exposed Support Structures Exposed Support Structures] [2:Full Thickness Without] [5:Full Thickness Without] Exudate Amount: [1:Medium] [2:Medium] [5:Small] Exudate Type: [1:Serosanguineous] [2:Serosanguineous] [5:Serous] Exudate Color: [1:red, brown] [2:red, brown] [5:amber] Wound Margin: [1:Distinct, outline attached Distinct, outline attached Distinct, outline attached] Granulation Amount: [1:Medium (34-66%)] [2:Large (67-100%)] [5:Medium (34-66%)] Granulation Quality: [1:Red, Pink] [2:Red, Pink, Hyper- granulation] [5:Red, Pink] Necrotic Amount: [1:Medium (34-66%)] [2:Small (1-33%)] [5:Medium (34-66%)] Exposed Structures: [1:Fat Layer (Subcutaneous Fat Layer (Subcutaneous Fascia: No Tissue) Exposed: Yes Fascia: No Tendon: No Muscle: No Joint: No Bone: No] [2:Tissue) Exposed: Yes Fascia: No Tendon: No Muscle: No Joint: No Bone: No] [5:Fat Layer (Subcutaneous Tissue)  Exposed: No  Tendon: No Muscle: No Joint: No Bone: No] Epithelialization: [1:Medium (34-66%)] [2:Small (1-33%)] [5:Small (1-33%)] Debridement: [1:Debridement - Excisional N/A] [5:N/A] Pre-procedure [1:15:22] [2:N/A] [5:N/A] Verification/Time Out Taken: Pain Control: [1:Other] [2:N/A] [5:N/A] Tissue Debrided: [1:Subcutaneous, Slough] [2:N/A] [5:N/A] Level: [1:Skin/Subcutaneous Tissue N/A] [5:N/A] Debridement Area (sq cm):3 [2:N/A] [5:N/A] Instrument: [1:Curette] [2:N/A] [5:N/A] Bleeding: [1:Minimum] [2:N/A] [5:N/A] Hemostasis Achieved: [1:Pressure] [2:N/A] [5:N/A] Procedural Pain: [1:0] [2:N/A] [5:N/A] Post Procedural Pain: [1:0] [2:N/A] [5:N/A] Debridement Treatment [1:Procedure was tolerated] [2:N/A] [5:N/A] Response: [1:well] Post Debridement [1:2x1.5x0.1] [2:N/A] [5:N/A] Measurements L x W x D (cm) Post Debridement [1:0.236] [2:N/A] [5:N/A] Volume: (cm) Procedures Performed: [1:Debridement] [2:N/A] [5:N/A] Treatment Notes Electronic Signature(s) Signed: 05/24/2019 10:01:17 AM By: Linton Ham MD Entered By: Linton Ham on 05/23/2019 15:35:17 -------------------------------------------------------------------------------- Multi-Disciplinary Care Plan Details Patient Name: Date of Service: Crystal Barth. 05/23/2019 2:00 PM Medical Record [id number] Patient Account Number: [id number] Date of Birth/Sex: Treating RN: 12-Jul-1945 (73 y.o. [name] Primary Care Provider: Christain [place] Other Clinician: Referring Provider: Treating Provider/Extender:Robson, Glendale Chard, Letta Moynahan in Treatment: 15 Active Inactive Abuse / Safety / Falls / Self Care Management Nursing Diagnoses: History of Falls Potential for falls Goals: Patient will remain injury free related to falls Date Initiated: 02/04/2019 Target Resolution Date: 06/17/2019 Goal Status: Active Patient/caregiver will verbalize/demonstrate measures taken to prevent injury and/or falls Date Initiated:  02/04/2019 Date Inactivated: 03/10/2019 Target Resolution Date: 03/11/2019 Goal Status: Met Interventions:  Assess fall risk on admission and as needed Assess: immobility, friction, shearing, incontinence upon admission and as needed Assess personal safety and home safety (as indicated) on admission and as needed Notes: Venous Leg Ulcer Nursing Diagnoses: Actual venous Insuffiency (use after diagnosis is confirmed) Knowledge deficit related to disease process and management Goals: Patient will maintain optimal edema control Date Initiated: 02/04/2019 Target Resolution Date: 06/17/2019 Goal Status: Active Patient/caregiver will verbalize understanding of disease process and disease management Date Initiated: 02/04/2019 Date Inactivated: 03/10/2019 Target Resolution Date: 03/11/2019 Goal Status: Met Interventions: Assess peripheral edema status every visit. Compression as ordered Provide education on venous insufficiency Treatment Activities: Therapeutic compression applied : 02/04/2019 Notes: Wound/Skin Impairment Nursing Diagnoses: Impaired tissue integrity Knowledge deficit related to ulceration/compromised skin integrity Goals: Patient/caregiver will verbalize understanding of skin care regimen Date Initiated: 02/04/2019 Target Resolution Date: 06/17/2019 Goal Status: Active Ulcer/skin breakdown will have a volume reduction of 30% by week 4 Date Initiated: 02/04/2019 Date Inactivated: 04/07/2019 Target Resolution Date: 03/11/2019 Unmet Reason: see wound Goal Status: Unmet measurements. Interventions: Assess patient/caregiver ability to obtain necessary supplies Assess patient/caregiver ability to perform ulcer/skin care regimen upon admission and as needed Assess ulceration(s) every visit Provide education on ulcer and skin care Treatment Activities: Skin care regimen initiated : 02/04/2019 Topical wound management initiated : 02/04/2019 Notes: Electronic Signature(s) Signed:  05/23/2019 5:51:44 PM By: Levan Hurst RN, BSN Entered By: Levan Hurst on 05/23/2019 17:42:54 -------------------------------------------------------------------------------- Pain Assessment Details Patient Name: Date of Service: Crystal Fray F. 05/23/2019 2:00 PM Medical Record [id number] Patient Account Number: [id number] Date of Birth/Sex: Treating RN: 30-Aug-1945 (72 y.o. [name] Primary Care Provider: Christain [place] Other Clinician: Referring Provider: Treating Provider/Extender:Robson, Glendale Chard, Letta Moynahan in Treatment: 15 Active Problems Location of Pain Severity and Description of Pain Patient Has Paino No Site Locations Pain Management and Medication Current Pain Management: Electronic Signature(s) Signed: 05/25/2019 12:04:56 PM By: Kela Millin Entered By: Kela Millin on 05/23/2019 14:35:34 -------------------------------------------------------------------------------- Patient/Caregiver Education Details Patient Name: Date of Service: [name], [name] 12/7/2020andnbsp2:00 PM Medical Record [id number] Patient Account Number: [id number] Date of Birth/Gender: 06/09/1946 (73 y.o. F) Treating RN: Levan Hurst Primary Care Physician: Christain [place] Other Clinician: Referring Physician: Treating Physician/Extender:Robson, Glendale Chard, Letta Moynahan in Treatment: 15 Education Assessment Education Provided To: Patient Education Topics Provided Malignant/Atypical Wounds: Methods: Explain/Verbal Responses: State content correctly Wound/Skin Impairment: Methods: Explain/Verbal Responses: State content correctly Electronic Signature(s) Signed: 05/23/2019 5:51:44 PM By: Levan Hurst RN, BSN Entered By: Levan Hurst on 05/23/2019 17:43:11 -------------------------------------------------------------------------------- Wound Assessment Details Patient Name: Date of Service: Crystal Fray F. 05/23/2019 2:00  PM Medical Record [id number] Patient Account Number: [id number] Date of Birth/Sex: Treating RN: [birth year] (73 y.o. [name] Primary Care Provider: Christain [place] Other Clinician: Referring Provider: Treating Provider/Extender:Robson, Glendale Chard, Jama Flavors Weeks in Treatment: 15 Wound Status Wound Number: 1 Primary Abrasion Etiology: Wound Location: Left Knee - Anterior Wound Open Wounding Event: Trauma Status: Date Acquired: 01/10/2019 Comorbid Cataracts, Chronic Obstructive Pulmonary Weeks Of Treatment: 15 History: Disease (COPD), Hypertension, Peripheral Clustered Wound: No Venous Disease Photos Wound Measurements Length: (cm) 2 % Reduc Width: (cm) 1.5 % Reduc Depth: (cm) 0.1 Epithel Area: (cm) 2.356 Tunnel Volume: (cm) 0.236 Underm Wound Description Full Thickness Without Exposed Support Foul Od Classification: Structures Slough/ Wound Distinct, outline attached Margin: Exudate Medium Amount: Exudate Serosanguineous Type: Exudate red, brown Color: Wound Bed Granulation Amount: Medium (34-66%) Granulation Quality: Red, Pink Fascia E Necrotic Amount: Medium (34-66%) Fat Laye Necrotic Quality:  Adherent Slough Tendon E Muscle E Joint Ex Bone Exp or After Cleansing: No Fibrino Yes Exposed Structure xposed: No r (Subcutaneous Tissue) Exposed: Yes xposed: No xposed: No posed: No osed: No tion in Area: 82.1% tion in Volume: 94% ialization: Medium (34-66%) ing: No ining: No Treatment Notes Wound #1 (Left, Anterior Knee) 1. Cleanse With Wound Cleanser Soap and water 2. Periwound Care Skin Prep 3. Primary Dressing Applied Collegen AG Hydrogel or K-Y Jelly 4. Secondary Dressing Foam Border Dressing 5. Secured With Office manager) Signed: 05/24/2019 4:34:42 PM By: Mikeal Hawthorne EMT/HBOT Signed: 05/25/2019 12:04:56 PM By: Kela Millin Entered By: Mikeal Hawthorne on 05/24/2019  14:14:57 -------------------------------------------------------------------------------- Wound Assessment Details Patient Name: Date of Service: Crystal Barth. 05/23/2019 2:00 PM Medical Record [id number] Patient Account Number: [id number] Date of Birth/Sex: Treating RN: 1946-02-03 (73 y.o. Hollie Salk, Larene [place] Primary Care Provider: Christain [place] Other Clinician: Referring Provider: Treating Provider/Extender:Robson, Glendale Chard, Jama Flavors Weeks in Treatment: 15 Wound Status Wound Number: 2 Primary Malignant Wound Etiology: Wound Location: Right Lower Leg - Anterior Wound Open Wounding Event: Gradually Appeared Status: Date Acquired: 01/28/2019 Comorbid Cataracts, Chronic Obstructive Pulmonary Weeks Of Treatment: 15 History: Disease (COPD), Hypertension, Peripheral Clustered Wound: Yes Venous Disease Photos Wound Measurements Length: (cm) 1.5 % Reduct Width: (cm) 2 % Reduct Depth: (cm) 0.1 Epitheli Area: (cm) 2.356 Tunneli Volume: (cm) 0.236 Undermi Wound Description Classification: Full Thickness Without Exposed Support Foul Odo Structures Slough/F Wound Distinct, outline attached Margin: Exudate Medium Amount: Exudate Serosanguineous Type: Exudate red, brown Color: Wound Bed Granulation Amount: Large (67-100%) Granulation Quality: Red, Pink, Hyper-granulation Fascia E Necrotic Amount: Small (1-33%) Fat Laye Necrotic Quality: Adherent Slough Tendon E Muscle E Joint Ex Bone Exp r After Cleansing: No ibrino No Exposed Structure xposed: No r (Subcutaneous Tissue) Exposed: Yes xposed: No xposed: No posed: No osed: No ion in Area: -400.2% ion in Volume: -402.1% alization: Small (1-33%) ng: No ning: No Treatment Notes Wound #2 (Right, Anterior Lower Leg) 1. Cleanse With Wound Cleanser Soap and water 2. Periwound Care Moisturizing lotion Skin Prep 3. Primary Dressing Applied Foam 6. Support Layer Applied 3 layer compression  wrap Notes stretch net Electronic Signature(s) Signed: 05/24/2019 4:34:42 PM By: Mikeal Hawthorne EMT/HBOT Signed: 05/25/2019 12:04:56 PM By: Kela Millin Entered By: Mikeal Hawthorne on 05/24/2019 14:29:21 -------------------------------------------------------------------------------- Wound Assessment Details Patient Name: Date of Service: Crystal Barth. 05/23/2019 2:00 PM Medical Record [id number] Patient Account Number: [id number] Date of Birth/Sex: Treating RN: September 19, 1945 (73 y.o. [name] Primary Care Provider: Christain [place] Other Clinician: Referring Provider: Treating Provider/Extender:Robson, Glendale Chard, Jama Flavors Weeks in Treatment: 15 Wound Status Wound Number: 5 Primary Venous Leg Ulcer Etiology: Wound Location: Right Lower Leg - Lateral, Proximal Wound Open Wounding Event: Blister Status: Date Acquired: 04/10/2019 Comorbid Cataracts, Chronic Obstructive Pulmonary Weeks Of Treatment: 5 History: Disease (COPD), Hypertension, Peripheral Clustered Wound: Yes Venous Disease Photos Wound Measurements Length: (cm) 0.2 % Reduc Width: (cm) 0.4 % Reduc Depth: (cm) 0.1 Epithel Clustered Quantity: 2 Tunneli Area: (cm) 0.063 Underm Volume: (cm) 0.006 Wound Description Classification: Full Thickness Without Exposed Support Foul Od Structures Slough/ Wound Distinct, outline attached Margin: Exudate Small Amount: Exudate Serous Serous Type: Exudate amber Color: Wound Bed Granulation Amount: Medium (34-66%) Granulation Quality: Red, Pink Fascia Expos Necrotic Amount: Medium (34-66%) Fat Layer (S Necrotic Quality: Adherent Slough Tendon Expos Muscle Expos Joint Expose Bone Exposed or After Cleansing: No Fibrino Yes Exposed Structure ed: No ubcutaneous Tissue) Exposed: No ed: No ed:  No d: No : No tion in Area: 97.9% tion in Volume: 98% ialization: Small (1-33%) ng: No ining: No Treatment Notes Wound #5 (Right, Proximal,  Lateral Lower Leg) 1. Cleanse With Wound Cleanser Soap and water 2. Periwound Care Skin Prep 3. Primary Dressing Applied Collegen AG Hydrogel or K-Y Jelly 4. Secondary Dressing Foam Border Dressing 5. Secured With Office manager) Signed: 05/24/2019 4:34:42 PM By: Mikeal Hawthorne EMT/HBOT Signed: 05/25/2019 12:04:56 PM By: Kela Millin Entered By: Mikeal Hawthorne on 05/24/2019 14:30:04 -------------------------------------------------------------------------------- Vitals Details Patient Name: Date of Service: Crystal Barth. 05/23/2019 2:00 PM Medical Record [id number] Patient Account Number: [id number] Date of Birth/Sex: Treating RN: Oct 05, 1945 (73 y.o. [name] Primary Care [place]: Christain [place] Other Clinician: Referring Jailyne Chieffo: Treating Jenyfer Trawick/Extender:Robson, Glendale Chard, Letta Moynahan in Treatment: 15 Vital Signs Time Taken: 14:30 Temperature (F): 99.3 Height (in): 62 Pulse (bpm): 113 Weight (lbs): 112 Respiratory Rate (breaths/min): 19 Body Mass Index (BMI): 20.5 Blood Pressure (mmHg): 146/84 Reference Range: 80 - 120 mg / dl Electronic Signature(s) Signed: 05/25/2019 12:04:56 PM By: Kela Millin Entered By: Kela Millin on 05/23/2019 14:34:22

## 2019-05-25 NOTE — Progress Notes (Signed)
Crystal, [name] ([id number]) Visit Report for 04/18/2019 Arrival Information Details Patient Name: Date of Service: [name], [name] 04/18/2019 2:00 PM Medical Record [id number] Patient Account Number: [id number] Date of Birth/Sex: Treating RN: 03/07/1946 (73 y.o. [name] Primary Care Lugene Hitt: Christain [place] Other Clinician: Referring Judithann Villamar: Treating Acheron Sugg/Extender:Robson, Glendale Chard, Letta Moynahan in Treatment: 10 Visit Information History Since Last Visit Added or deleted any medications: No Patient Arrived: Wheel Chair Any new allergies or adverse reactions: No Arrival Time: 14:36 Had a fall or experienced change in No activities of daily living that may affect Accompanied By: friend risk of falls: Transfer Assistance: None Signs or symptoms of abuse/neglect since last No Patient Identification Verified: Yes visito Secondary Verification Process Completed: Yes Hospitalized since last visit: No Patient Requires Transmission-Based No Implantable device outside of the clinic excluding No Precautions: cellular tissue based products placed in the center Patient Has Alerts: No since last visit: Has Dressing in Place as Prescribed: Yes Pain Present Now: No Electronic Signature(s) Signed: 05/24/2019 3:00:22 PM By: Sandre Kitty Entered By: Sandre Kitty on 04/18/2019 14:41:08 -------------------------------------------------------------------------------- Compression Therapy Details Patient Name: Date of Service: Crystal Fray F. 04/18/2019 2:00 PM Medical Record [id number] Patient Account Number: [id number] Date of Birth/Sex: Treating RN: 03-28-1946 (73 y.o. [name] Primary Care Emanii Bugbee: Christain [place] Other Clinician: Referring Stefanny Pieri: Treating Emmersen Garraway/Extender:Robson, Glendale Chard, Jama Flavors Weeks in Treatment: 10 Compression Therapy Performed for Wound Wound #5 Right,Proximal,Lateral Lower Leg Assessment: Performed  By: Clinician Levan Hurst, RN Compression Type: Three Layer Post Procedure Diagnosis Same as Pre-procedure Electronic Signature(s) Signed: 04/18/2019 6:00:40 PM By: Levan Hurst RN, BSN Entered By: Levan Hurst on 04/18/2019 15:48:00 -------------------------------------------------------------------------------- Compression Therapy Details Patient Name: Date of Service: Crystal Fray F. 04/18/2019 2:00 PM Medical Record [id number] Patient Account Number: [id number] Date of Birth/Sex: Treating RN: 06/14/1946 (73 y.o. [name] Primary Care Sanoe Hazan: Christain [place] Other Clinician: Referring Shawnique Mariotti: Treating Jameyah Fennewald/Extender:Robson, Glendale Chard, Letta Moynahan in Treatment: 10 Compression Therapy Performed for Wound Wound #2 Right,Anterior Lower Leg Assessment: Performed By: Clinician Levan Hurst, RN Compression Type: Three Layer Post Procedure Diagnosis Same as Pre-procedure Electronic Signature(s) Signed: 04/18/2019 6:00:40 PM By: Levan Hurst RN, BSN Entered By: Levan Hurst on 04/18/2019 15:48:00 -------------------------------------------------------------------------------- Encounter Discharge Information Details Patient Name: Date of Service: Crystal Fray F. 04/18/2019 2:00 PM Medical Record [id number] Patient Account Number: [id number] Date of Birth/Sex: Treating RN: Aug 07, 1945 (73 y.o. [name] Primary Care Zsofia Prout: Christain [place] Other Clinician: Referring Justinn Welter: Treating Kannon Granderson/Extender:Robson, Glendale Chard, Letta Moynahan in Treatment: 10 Encounter Discharge Information Items Discharge Condition: Stable Ambulatory Status: Wheelchair Discharge Destination: Home Transportation: Private Auto Accompanied By: sister Schedule Follow-up Appointment: Yes Clinical Summary of Care: Patient Declined Electronic Signature(s) Signed: 04/21/2019 5:16:49 PM By: Kela Millin Entered By: Kela Millin on  04/18/2019 16:10:50 -------------------------------------------------------------------------------- Lower Extremity Assessment Details Patient Name: Date of Service: Crystal, [name] 04/18/2019 2:00 PM Medical Record [id number] Patient Account Number: [id number] Date of Birth/Sex: Treating RN: 11-13-1945 (74 y.o. Orvan Falconer Primary Care Kawanda Drumheller: Christain [place] Other Clinician: Referring Aayden Cefalu: Treating Jacquees Gongora/Extender:Robson, Glendale Chard, Jama Flavors Weeks in Treatment: 10 Edema Assessment Assessed: [Left: No] [Right: Yes] Edema: [Left: Yes] [Right: Yes] Calf Left: Right: Point of Measurement: 29 cm From Medial Instep 32 cm 28 cm Ankle Left: Right: Point of Measurement: 9 cm From Medial Instep 24 cm 21 cm Electronic Signature(s) Signed: 05/25/2019 12:05:18 PM By: Carlene Coria RN Entered By: Carlene Coria on 04/18/2019 14:54:17 -------------------------------------------------------------------------------- Multi  Wound Chart Details Patient Name: Date of Service: Crystal, [name] 04/18/2019 2:00 PM Medical Record [id number] Patient Account Number: [id number] Date of Birth/Sex: Treating RN: July 08, 1945 (73 y.o. [name] Primary Care Brick Ketcher: Christain [place] Other Clinician: Referring Chana Lindstrom: Treating Anasha Perfecto/Extender:Robson, Glendale Chard, Jama Flavors Weeks in Treatment: 10 Vital Signs Height(in): 54 Pulse(bpm): 66 Weight(lbs): 112 Blood Pressure(mmHg): 151/77 Body Mass Index(BMI): 20 Temperature(F): 98.6 Respiratory 19 19 Rate(breaths/min): Photos: [1:No Photos] [2:No Photos] [4:No Photos] Wound Location: [1:Left Knee - Anterior] [2:Right Lower Leg - Anterior Right Lower Leg - Lateral] Wounding Event: [1:Trauma] [2:Gradually Appeared] [4:Gradually Appeared] Primary Etiology: [1:Abrasion] [2:Venous Leg Ulcer] [4:Venous Leg Ulcer] Comorbid History: [1:Cataracts, Chronic Obstructive Pulmonary Disease (COPD), Hypertension, Peripheral  Hypertension, Peripheral Hypertension, Peripheral Venous Disease] [2:Cataracts, Chronic Obstructive Pulmonary Disease (COPD), Venous Disease]  [4:Cataracts, Chronic Obstructive Pulmonary Disease (COPD), Venous Disease] Date Acquired: [1:01/10/2019] [2:01/28/2019] [4:03/31/2019] Weeks of Treatment: [1:10] [2:10] [4:1] Wound Status: [1:Open] [2:Open] [4:Healed - Epithelialized] Clustered Wound: [1:No] [2:Yes] [4:No] Measurements L x W x D 2.5x1.2x0.1 [2:1.2x1.5x0.1] [4:0x0x0] (cm) Area (cm) : [1:2.356] [2:1.414] [4:0] Volume (cm) : [1:0.236] [2:0.141] [4:0] % Reduction in Area: [1:82.10%] [2:-200.20%] [4:100.00%] % Reduction in Volume: 94.00% [2:-200.00%] [4:100.00%] Classification: [1:Full Thickness Without Exposed Support Structures Exposed Support Structures Exposed Support Structures] [2:Full Thickness Without] [4:Full Thickness Without] Exudate Amount: [1:Medium] [2:Medium] [4:None Present] Exudate Type: [1:Serosanguineous] [2:Serosanguineous] [4:N/A] Exudate Color: [1:red, brown] [2:red, brown] [4:N/A] Wound Margin: [1:Well defined, not attached Flat and Intact] [4:Distinct, outline attached] Granulation Amount: [1:Medium (34-66%)] [2:Large (67-100%)] [4:None Present (0%)] Granulation Quality: [1:Pink, Pale] [2:Red, Pink] [4:N/A] Necrotic Amount: [1:Medium (34-66%)] [2:None Present (0%)] [4:None Present (0%)] Exposed Structures: [1:Fat Layer (Subcutaneous Fat Layer (Subcutaneous Fascia: No Tissue) Exposed: Yes Fascia: No Tendon: No Muscle: No Joint: No Bone: No] [2:Tissue) Exposed: Yes Fascia: No Tendon: No Muscle: No Joint: No Bone: No] [4:Fat Layer (Subcutaneous Tissue)  Exposed: No Tendon: No Muscle: No Joint: No Bone: No] Epithelialization: [1:Small (1-33%)] [2:None] [4:Large (67-100%)] Procedures Performed: N/A [1:5] [2:Compression Therapy N/A] [4:N/A N/A] Photos: [1:No Photos] [2:N/A] [4:N/A] Wound Location: [1:Right Lower Leg - Lateral, Proximal] [2:N/A] [4:N/A] Wounding Event:  [1:Blister] [2:N/A] [4:N/A] Primary Etiology: [1:Venous Leg Ulcer] [2:N/A] [4:N/A] Comorbid History: [1:Cataracts, Chronic Obstructive Pulmonary Disease (COPD), Hypertension, Peripheral Venous Disease] [2:N/A] [4:N/A] Date Acquired: [1:04/10/2019] [2:N/A] [4:N/A] Weeks of Treatment: [1:0] [2:N/A] [4:N/A] Wound Status: [1:Open] [2:N/A] [4:N/A] Clustered Wound: [1:No] [2:N/A] [4:N/A] Measurements L x W x D 1.5x2.5x0.1 [2:N/A] [4:N/A] (cm) Area (cm) : [1:2.945] [2:N/A] [4:N/A] Volume (cm) : [1:0.295] [2:N/A] [4:N/A] % Reduction in Area: [1:N/A] [2:N/A N/A] % Reduction in Volume: [1:N/A] [2:N/A N/A] Classification: [1:Full Thickness Without Exposed Support Structures] [2:N/A N/A] Exudate Amount: [1:Small] [2:N/A N/A] Exudate Type: [1:Serosanguineous] [2:N/A N/A] Exudate Color: [1:red, brown] [2:N/A N/A] Wound Margin: [1:N/A] [2:N/A N/A] Granulation Amount: [1:N/A] [2:N/A N/A] Granulation Quality: [1:N/A] [2:N/A N/A] Necrotic Amount: [1:N/A] [2:N/A N/A] Exposed Structures: [1:Fascia: No Fat Layer (Subcutaneous Tissue) Exposed: No Tendon: No Muscle: No Joint: No Bone: No] [2:N/A N/A] Epithelialization: [1:None Compression Therapy] [2:N/A N/A N/A N/A] Treatment Notes Wound #1 (Left, Anterior Knee) 1. Cleanse With Wound Cleanser Soap and water 2. Periwound Care Moisturizing lotion TCA Cream 3. Primary Dressing Applied Calcium Alginate Ag 4. Secondary Dressing ABD Pad Foam 6. Support Layer Applied 3 layer compression wrap Notes stretch net Wound #2 (Right, Anterior Lower Leg) 1. Cleanse With Wound Cleanser Soap and water 2. Periwound Care Moisturizing lotion TCA Cream 3. Primary Dressing Applied Calcium Alginate Ag 4. Secondary Dressing ABD Pad Foam 6. Support  Layer Applied 3 layer compression wrap Notes stretch net Wound #5 (Right, Proximal, Lateral Lower Leg) 1. Cleanse With Wound Cleanser Soap and water 2. Periwound Care Moisturizing lotion TCA Cream 3.  Primary Dressing Applied Calcium Alginate Ag 4. Secondary Dressing ABD Pad Foam 6. Support Layer Applied 3 layer compression wrap Notes stretch net Electronic Signature(s) Signed: 04/18/2019 5:57:33 PM By: Linton Ham MD Signed: 04/18/2019 6:00:40 PM By: Levan Hurst RN, BSN Entered By: Linton Ham on 04/18/2019 17:35:36 -------------------------------------------------------------------------------- Multi-Disciplinary Care Plan Details Patient Name: Date of Service: CLARABEL, MARION 04/18/2019 2:00 PM Medical Record [id number] Patient Account Number: [id number] Date of Birth/Sex: Treating RN: [birth year] (73 y.o. [name] Primary Care Nyah Shepherd: Christain [place] Other Clinician: Referring Charod Slawinski: Treating Ruther Ephraim/Extender:Robson, Glendale Chard, Letta Moynahan in Treatment: 10 Active Inactive Abuse / Safety / Falls / Self Care Management Nursing Diagnoses: History of Falls Potential for falls Goals: Patient will remain injury free related to falls Date Initiated: 02/04/2019 Target Resolution Date: 05/13/2019 Goal Status: Active Patient/caregiver will verbalize/demonstrate measures taken to prevent injury and/or falls Date Initiated: 02/04/2019 Date Inactivated: 03/10/2019 Target Resolution Date: 03/11/2019 Goal Status: Met Interventions: Assess fall risk on admission and as needed Assess: immobility, friction, shearing, incontinence upon admission and as needed Assess personal safety and home safety (as indicated) on admission and as needed Notes: Venous Leg Ulcer Nursing Diagnoses: Actual venous Insuffiency (use after diagnosis is confirmed) Knowledge deficit related to disease process and management Goals: Patient will maintain optimal edema control Date Initiated: 02/04/2019 Target Resolution Date: 05/13/2019 Goal Status: Active Patient/caregiver will verbalize understanding of disease process and disease management Date Initiated:  02/04/2019 Date Inactivated: 03/10/2019 Target Resolution Date: 03/11/2019 Goal Status: Met Interventions: Assess peripheral edema status every visit. Compression as ordered Provide education on venous insufficiency Treatment Activities: Therapeutic compression applied : 02/04/2019 Notes: Wound/Skin Impairment Nursing Diagnoses: Impaired tissue integrity Knowledge deficit related to ulceration/compromised skin integrity Goals: Patient/caregiver will verbalize understanding of skin care regimen Date Initiated: 02/04/2019 Target Resolution Date: 05/13/2019 Goal Status: Active Ulcer/skin breakdown will have a volume reduction of 30% by week 4 Date Initiated: 02/04/2019 Date Inactivated: 04/07/2019 Target Resolution Date: 03/11/2019 Unmet Reason: see wound Goal Status: Unmet measurements. Interventions: Assess patient/caregiver ability to obtain necessary supplies Assess patient/caregiver ability to perform ulcer/skin care regimen upon admission and as needed Assess ulceration(s) every visit Provide education on ulcer and skin care Treatment Activities: Skin care regimen initiated : 02/04/2019 Topical wound management initiated : 02/04/2019 Notes: Electronic Signature(s) Signed: 04/18/2019 6:00:40 PM By: Levan Hurst RN, BSN Entered By: Levan Hurst on 04/18/2019 17:39:48 -------------------------------------------------------------------------------- Pain Assessment Details Patient Name: Date of Service: Crystal Fray F. 04/18/2019 2:00 PM Medical Record [id number] Patient Account Number: [id number] Date of Birth/Sex: Treating RN: 1946-01-28 (73 y.o. [name] Primary Care Corneluis Allston: Christain [place] Other Clinician: Referring Jacon Whetzel: Treating Marieta Markov/Extender:Robson, Glendale Chard, Letta Moynahan in Treatment: 10 Active Problems Location of Pain Severity and Description of Pain Patient Has Paino No Site Locations Pain Management and Medication Current Pain  Management: Electronic Signature(s) Signed: 04/18/2019 6:00:40 PM By: Levan Hurst RN, BSN Signed: 05/24/2019 3:00:22 PM By: Sandre Kitty Entered By: Sandre Kitty on 04/18/2019 14:42:55 -------------------------------------------------------------------------------- Patient/Caregiver Education Details Patient Name: Date of Service: SAAMIYA, JEPPSEN 11/2/2020andnbsp2:00 PM Medical Record [id number] Patient Account Number: [id number] Date of Birth/Gender: [birth year] (73 y.o. F) Treating RN: Levan Hurst Primary Care Physician: Christain [place] Other Clinician: Referring Physician: Treating Physician/Extender:Robson, Glendale Chard, Letta Moynahan in Treatment: 10 Education Assessment Education  Provided To: Patient Education Topics Provided Venous: Methods: Explain/Verbal Responses: State content correctly Wound/Skin Impairment: Methods: Explain/Verbal Responses: State content correctly Electronic Signature(s) Signed: 04/18/2019 6:00:40 PM By: Levan Hurst RN, BSN Entered By: Levan Hurst on 04/18/2019 17:42:54 -------------------------------------------------------------------------------- Wound Assessment Details Patient Name: Date of Service: Crystal Fray F. 04/18/2019 2:00 PM Medical Record [id number] Patient Account Number: [id number] Date of Birth/Sex: Treating RN: [birth year] (73 y.o. Orvan Falconer Primary Care Develle Sievers: Christain [place] Other Clinician: Referring Inocencio Roy: Treating Gustavo Meditz/Extender:Robson, Glendale Chard, Jama Flavors Weeks in Treatment: 10 Wound Status Wound Number: 1 Primary Abrasion Etiology: Wound Location: Left Knee - Anterior Wound Open Wounding Event: Trauma Status: Date Acquired: 01/10/2019 Comorbid Cataracts, Chronic Obstructive Pulmonary Weeks Of Treatment: 10 History: Disease (COPD), Hypertension, Peripheral Clustered Wound: No Venous Disease Photos Wound Measurements Length: (cm) 2.5 % Reduc Width: (cm) 1.2 %  Reduc Depth: (cm) 0.1 Epitheli Area: (cm) 2.356 Tunneli Volume: (cm) 0.236 Undermi Wound Description Classification: Full Thickness Without Exposed Support Foul Od Structures Slough/ Wound Well defined, not attached Margin: Exudate Medium Amount: Exudate Serosanguineous Type: Exudate red, brown Color: Wound Bed Granulation Amount: Medium (34-66%) Granulation Quality: Pink, Pale Fascia E Necrotic Amount: Medium (34-66%) Fat Laye Necrotic Quality: Adherent Slough Tendon E Muscle E Joint Ex Bone Exp or After Cleansing: No Fibrino Yes Exposed Structure xposed: No r (Subcutaneous Tissue) Exposed: Yes xposed: No xposed: No posed: No osed: No tion in Area: 82.1% tion in Volume: 94% alization: Small (1-33%) ng: No ning: No Electronic Signature(s) Signed: 04/19/2019 10:47:12 AM By: Mikeal Hawthorne EMT/HBOT Signed: 05/25/2019 12:05:18 PM By: Carlene Coria RN Entered By: Mikeal Hawthorne on 04/19/2019 08:30:43 -------------------------------------------------------------------------------- Wound Assessment Details Patient Name: Date of Service: Crystal Fray F. 04/18/2019 2:00 PM Medical Record [id number] Patient Account Number: [id number] Date of Birth/Sex: Treating RN: 1945-11-02 (73 y.o. Orvan Falconer Primary Care Irvin Bastin: Christain [place] Other Clinician: Referring Jerrian Mells: Treating Kimbree Casanas/Extender:Robson, Glendale Chard, Jama Flavors Weeks in Treatment: 10 Wound Status Wound Number: 2 Primary Venous Leg Ulcer Etiology: Wound Location: Right Lower Leg - Anterior Wound Open Wounding Event: Gradually Appeared Status: Date Acquired: 01/28/2019 Comorbid Cataracts, Chronic Obstructive Pulmonary Weeks Of Treatment: 10 History: Disease (COPD), Hypertension, Peripheral Clustered Wound: Yes Venous Disease Photos Wound Measurements Length: (cm) 1.2 % Reduct Width: (cm) 1.5 % Reduct Depth: (cm) 0.1 Epitheli Area: (cm) 1.414 Tunneli Volume: (cm) 0.141  Undermi Wound Description Full Thickness Without Exposed Support Foul Od Classification: Structures Slough/ Wound Flat and Intact Margin: Exudate Medium Amount: Exudate Serosanguineous Type: Exudate red, brown Color: Wound Bed Granulation Amount: Large (67-100%) Granulation Quality: Red, Pink Fascia E Necrotic Amount: None Present (0%) Fat Laye Tendon E Muscle E Joint Ex Bone Exp or After Cleansing: No Fibrino No Exposed Structure xposed: No r (Subcutaneous Tissue) Exposed: Yes xposed: No xposed: No posed: No osed: No ion in Area: -200.2% ion in Volume: -200% alization: None ng: No ning: No Electronic Signature(s) Signed: 04/19/2019 10:47:12 AM By: Mikeal Hawthorne EMT/HBOT Signed: 05/25/2019 12:05:18 PM By: Carlene Coria RN Entered By: Mikeal Hawthorne on 04/19/2019 08:30:59 -------------------------------------------------------------------------------- Wound Assessment Details Patient Name: Date of Service: Crystal Fray F. 04/18/2019 2:00 PM Medical Record [id number] Patient Account Number: [id number] Date of Birth/Sex: Treating RN: May 21, 1946 (73 y.o. Orvan Falconer Primary Care Euel Castile: Christain [place] Other Clinician: Referring Ariba Lehnen: Treating Tamikka Pilger/Extender:Robson, Glendale Chard, Jama Flavors Weeks in Treatment: 10 Wound Status Wound Number: 4 Primary Venous Leg Ulcer Etiology: Wound Location: Right Lower Leg - Lateral Wound Healed - Epithelialized Wounding Event: Gradually Appeared  Status: Date Acquired: 03/31/2019 Comorbid Cataracts, Chronic Obstructive Pulmonary Weeks Of Treatment: 1 History: Disease (COPD), Hypertension, Peripheral Clustered Wound: No Venous Disease Photos Wound Measurements Length: (cm) 0 % Reduct Width: (cm) 0 % Reduct Depth: (cm) 0 Epitheli Area: (cm) 0 Tunneli Volume: (cm) 0 Undermi Wound Description Classification: Full Thickness Without Exposed Support Foul Odo Structures Slough/F Wound Distinct, outline  attached Margin: Exudate None Present Amount: Wound Bed Granulation Amount: None Present (0%) Necrotic Amount: None Present (0%) Fascia E Fat Laye Tendon E Muscle E Joint Ex Bone Exp r After Cleansing: No ibrino No Exposed Structure xposed: No r (Subcutaneous Tissue) Exposed: No xposed: No xposed: No posed: No osed: No ion in Area: 100% ion in Volume: 100% alization: Large (67-100%) ng: No ning: No Electronic Signature(s) Signed: 04/19/2019 10:47:12 AM By: Mikeal Hawthorne EMT/HBOT Signed: 05/25/2019 12:05:18 PM By: Carlene Coria RN Previous Signature: 04/18/2019 6:00:40 PM Version By: Levan Hurst RN, BSN Entered By: Mikeal Hawthorne on 04/19/2019 08:31:24 -------------------------------------------------------------------------------- Wound Assessment Details Patient Name: Date of Service: Crystal Fray F. 04/18/2019 2:00 PM Medical Record [id number] Patient Account Number: [id number] Date of Birth/Sex: Treating RN: March 06, 1946 (74 y.o. Orvan Falconer Primary Care Blakely Maranan: Christain [place] Other Clinician: Referring Prezley Qadir: Treating Christophe Rising/Extender:Robson, Glendale Chard, Jama Flavors Weeks in Treatment: 10 Wound Status Wound Number: 5 Primary Venous Leg Ulcer Etiology: Wound Location: Right Lower Leg - Lateral, Proximal Wound Open Wounding Event: Blister Status: Date Acquired: 04/10/2019 Comorbid Cataracts, Chronic Obstructive Pulmonary Weeks Of Treatment: 0 History: Disease (COPD), Hypertension, Peripheral Clustered Wound: No Venous Disease Photos Wound Measurements Length: (cm) 1.5 % Reduc Width: (cm) 2.5 % Reduc Depth: (cm) 0.1 Epithel Area: (cm) 2.945 Tunnel Volume: (cm) 0.295 Underm Wound Description Classification: Full Thickness Without Exposed Support Structures Exudate Small Amount: Exudate Serosanguineous Type: Exudate red, brown Color: Wound Bed Foul Odor After Cleansing: No Slough/Fibrino No Exposed Structure Fascia Exposed:  No Fat Layer (Subcutaneous Tissue) Exposed: No Tendon Exposed: No Muscle Exposed: No Joint Exposed: No Bone Exposed: No tion in Area: 0% tion in Volume: 0% ialization: None ing: No ining: No Electronic Signature(s) Signed: 04/19/2019 10:47:12 AM By: Mikeal Hawthorne EMT/HBOT Signed: 05/25/2019 12:05:18 PM By: Carlene Coria RN Entered By: Mikeal Hawthorne on 04/19/2019 08:31:44 -------------------------------------------------------------------------------- Vitals Details Patient Name: Date of Service: Crystal Fray F. 04/18/2019 2:00 PM Medical Record [id number] Patient Account Number: [id number] Date of Birth/Sex: Treating RN: [birth year] (73 y.o. [name] Primary Care Kaysea Raya: Christain [place] Other Clinician: Referring Edrei Norgaard: Treating Consuela Widener/Extender:Robson, Glendale Chard, Letta Moynahan in Treatment: 10 Vital Signs Time Taken: 14:41 Temperature (F): 98.6 Height (in): 62 Pulse (bpm): 91 Weight (lbs): 112 Respiratory Rate (breaths/min): 19 Body Mass Index (BMI): 20.5 Blood Pressure (mmHg): 151/77 Reference Range: 80 - 120 mg / dl Electronic Signature(s) Signed: 05/24/2019 3:00:22 PM By: Sandre Kitty Entered By: Sandre Kitty on 04/18/2019 14:42:49

## 2019-05-30 ENCOUNTER — Encounter (HOSPITAL_BASED_OUTPATIENT_CLINIC_OR_DEPARTMENT_OTHER): Payer: Medicare Other | Admitting: Internal Medicine

## 2019-06-06 ENCOUNTER — Encounter (HOSPITAL_BASED_OUTPATIENT_CLINIC_OR_DEPARTMENT_OTHER): Payer: Medicare Other | Admitting: Internal Medicine

## 2019-06-13 ENCOUNTER — Encounter (HOSPITAL_BASED_OUTPATIENT_CLINIC_OR_DEPARTMENT_OTHER): Payer: Medicare Other | Admitting: Internal Medicine

## 2019-07-15 ENCOUNTER — Ambulatory Visit: Payer: Medicare Other | Admitting: Internal Medicine

## 2019-08-18 ENCOUNTER — Encounter (HOSPITAL_COMMUNITY): Payer: Self-pay | Admitting: Internal Medicine

## 2019-08-18 ENCOUNTER — Emergency Department (HOSPITAL_COMMUNITY): Payer: Medicare Other

## 2019-08-18 ENCOUNTER — Inpatient Hospital Stay (HOSPITAL_COMMUNITY)
Admission: EM | Admit: 2019-08-18 | Discharge: 2019-08-24 | DRG: 871 | Disposition: A | Discharge status: Skilled Nursing Facility | Payer: Medicare Other | Source: Ambulatory Visit | Attending: Internal Medicine | Admitting: Internal Medicine

## 2019-08-18 ENCOUNTER — Other Ambulatory Visit: Payer: Self-pay

## 2019-08-18 DIAGNOSIS — I10 Essential (primary) hypertension: Secondary | ICD-10-CM | POA: Diagnosis present

## 2019-08-18 DIAGNOSIS — J449 Chronic obstructive pulmonary disease, unspecified: Secondary | ICD-10-CM | POA: Diagnosis present

## 2019-08-18 DIAGNOSIS — Z7289 Other problems related to lifestyle: Secondary | ICD-10-CM

## 2019-08-18 DIAGNOSIS — R6520 Severe sepsis without septic shock: Secondary | ICD-10-CM | POA: Diagnosis not present

## 2019-08-18 DIAGNOSIS — B9620 Unspecified Escherichia coli [E. coli] as the cause of diseases classified elsewhere: Secondary | ICD-10-CM | POA: Diagnosis present

## 2019-08-18 DIAGNOSIS — R7989 Other specified abnormal findings of blood chemistry: Secondary | ICD-10-CM | POA: Diagnosis not present

## 2019-08-18 DIAGNOSIS — J441 Chronic obstructive pulmonary disease with (acute) exacerbation: Secondary | ICD-10-CM | POA: Diagnosis present

## 2019-08-18 DIAGNOSIS — A419 Sepsis, unspecified organism: Secondary | ICD-10-CM | POA: Diagnosis present

## 2019-08-18 DIAGNOSIS — I959 Hypotension, unspecified: Secondary | ICD-10-CM | POA: Diagnosis not present

## 2019-08-18 DIAGNOSIS — Z7951 Long term (current) use of inhaled steroids: Secondary | ICD-10-CM

## 2019-08-18 DIAGNOSIS — I2510 Atherosclerotic heart disease of native coronary artery without angina pectoris: Secondary | ICD-10-CM | POA: Diagnosis present

## 2019-08-18 DIAGNOSIS — E43 Unspecified severe protein-calorie malnutrition: Secondary | ICD-10-CM | POA: Diagnosis present

## 2019-08-18 DIAGNOSIS — D539 Nutritional anemia, unspecified: Secondary | ICD-10-CM | POA: Diagnosis present

## 2019-08-18 DIAGNOSIS — D649 Anemia, unspecified: Secondary | ICD-10-CM

## 2019-08-18 DIAGNOSIS — M1990 Unspecified osteoarthritis, unspecified site: Secondary | ICD-10-CM

## 2019-08-18 DIAGNOSIS — E875 Hyperkalemia: Secondary | ICD-10-CM | POA: Diagnosis present

## 2019-08-18 DIAGNOSIS — R21 Rash and other nonspecific skin eruption: Secondary | ICD-10-CM | POA: Diagnosis present

## 2019-08-18 DIAGNOSIS — R531 Weakness: Secondary | ICD-10-CM

## 2019-08-18 DIAGNOSIS — I2781 Cor pulmonale (chronic): Secondary | ICD-10-CM | POA: Diagnosis present

## 2019-08-18 DIAGNOSIS — A4151 Sepsis due to Escherichia coli [E. coli]: Secondary | ICD-10-CM | POA: Diagnosis present

## 2019-08-18 DIAGNOSIS — N390 Urinary tract infection, site not specified: Secondary | ICD-10-CM | POA: Diagnosis present

## 2019-08-18 DIAGNOSIS — Z22322 Carrier or suspected carrier of Methicillin resistant Staphylococcus aureus: Secondary | ICD-10-CM

## 2019-08-18 DIAGNOSIS — Z20822 Contact with and (suspected) exposure to covid-19: Secondary | ICD-10-CM | POA: Diagnosis present

## 2019-08-18 DIAGNOSIS — Z885 Allergy status to narcotic agent status: Secondary | ICD-10-CM

## 2019-08-18 DIAGNOSIS — J9611 Chronic respiratory failure with hypoxia: Secondary | ICD-10-CM | POA: Diagnosis present

## 2019-08-18 DIAGNOSIS — J9621 Acute and chronic respiratory failure with hypoxia: Secondary | ICD-10-CM | POA: Diagnosis present

## 2019-08-18 DIAGNOSIS — F411 Generalized anxiety disorder: Secondary | ICD-10-CM | POA: Diagnosis present

## 2019-08-18 DIAGNOSIS — L89152 Pressure ulcer of sacral region, stage 2: Secondary | ICD-10-CM | POA: Diagnosis present

## 2019-08-18 DIAGNOSIS — K760 Fatty (change of) liver, not elsewhere classified: Secondary | ICD-10-CM | POA: Diagnosis present

## 2019-08-18 DIAGNOSIS — D72829 Elevated white blood cell count, unspecified: Secondary | ICD-10-CM | POA: Diagnosis present

## 2019-08-18 DIAGNOSIS — J9610 Chronic respiratory failure, unspecified whether with hypoxia or hypercapnia: Secondary | ICD-10-CM | POA: Diagnosis present

## 2019-08-18 DIAGNOSIS — I272 Pulmonary hypertension, unspecified: Secondary | ICD-10-CM | POA: Diagnosis present

## 2019-08-18 DIAGNOSIS — Z79891 Long term (current) use of opiate analgesic: Secondary | ICD-10-CM

## 2019-08-18 DIAGNOSIS — N179 Acute kidney failure, unspecified: Secondary | ICD-10-CM | POA: Diagnosis present

## 2019-08-18 DIAGNOSIS — M2550 Pain in unspecified joint: Secondary | ICD-10-CM

## 2019-08-18 DIAGNOSIS — Z841 Family history of disorders of kidney and ureter: Secondary | ICD-10-CM

## 2019-08-18 DIAGNOSIS — D638 Anemia in other chronic diseases classified elsewhere: Secondary | ICD-10-CM | POA: Diagnosis present

## 2019-08-18 DIAGNOSIS — R778 Other specified abnormalities of plasma proteins: Secondary | ICD-10-CM | POA: Diagnosis present

## 2019-08-18 DIAGNOSIS — Z8249 Family history of ischemic heart disease and other diseases of the circulatory system: Secondary | ICD-10-CM

## 2019-08-18 DIAGNOSIS — E860 Dehydration: Secondary | ICD-10-CM | POA: Diagnosis present

## 2019-08-18 DIAGNOSIS — Z7952 Long term (current) use of systemic steroids: Secondary | ICD-10-CM

## 2019-08-18 DIAGNOSIS — R7401 Elevation of levels of liver transaminase levels: Secondary | ICD-10-CM | POA: Diagnosis not present

## 2019-08-18 DIAGNOSIS — K8020 Calculus of gallbladder without cholecystitis without obstruction: Secondary | ICD-10-CM | POA: Diagnosis present

## 2019-08-18 DIAGNOSIS — R32 Unspecified urinary incontinence: Secondary | ICD-10-CM | POA: Diagnosis not present

## 2019-08-18 DIAGNOSIS — Z6820 Body mass index (BMI) 20.0-20.9, adult: Secondary | ICD-10-CM

## 2019-08-18 DIAGNOSIS — M11241 Other chondrocalcinosis, right hand: Secondary | ICD-10-CM | POA: Diagnosis present

## 2019-08-18 DIAGNOSIS — Z87891 Personal history of nicotine dependence: Secondary | ICD-10-CM

## 2019-08-18 DIAGNOSIS — M79641 Pain in right hand: Secondary | ICD-10-CM | POA: Diagnosis not present

## 2019-08-18 DIAGNOSIS — Z9981 Dependence on supplemental oxygen: Secondary | ICD-10-CM

## 2019-08-18 DIAGNOSIS — L8990 Pressure ulcer of unspecified site, unspecified stage: Secondary | ICD-10-CM | POA: Insufficient documentation

## 2019-08-18 DIAGNOSIS — Z79899 Other long term (current) drug therapy: Secondary | ICD-10-CM

## 2019-08-18 LAB — URINALYSIS, ROUTINE W REFLEX MICROSCOPIC
Bilirubin Urine: NEGATIVE
Glucose, UA: NEGATIVE mg/dL
Hgb urine dipstick: NEGATIVE
Ketones, ur: NEGATIVE mg/dL
Nitrite: NEGATIVE
Protein, ur: NEGATIVE mg/dL
Specific Gravity, Urine: 1.018 (ref 1.005–1.030)
pH: 5 (ref 5.0–8.0)

## 2019-08-18 LAB — COMPREHENSIVE METABOLIC PANEL
ALT: 124 U/L — ABNORMAL HIGH (ref 0–44)
AST: 183 U/L — ABNORMAL HIGH (ref 15–41)
Albumin: 2.2 g/dL — ABNORMAL LOW (ref 3.5–5.0)
Alkaline Phosphatase: 282 U/L — ABNORMAL HIGH (ref 38–126)
Anion gap: 19 — ABNORMAL HIGH (ref 5–15)
BUN: 87 mg/dL — ABNORMAL HIGH (ref 8–23)
CO2: 20 mmol/L — ABNORMAL LOW (ref 22–32)
Calcium: 8.6 mg/dL — ABNORMAL LOW (ref 8.9–10.3)
Chloride: 98 mmol/L (ref 98–111)
Creatinine, Ser: 3.56 mg/dL — ABNORMAL HIGH (ref 0.44–1.00)
GFR calc Af Amer: 14 mL/min — ABNORMAL LOW (ref >60)
GFR calc non Af Amer: 12 mL/min — ABNORMAL LOW (ref >60)
Glucose, Bld: 87 mg/dL (ref 70–99)
Potassium: 5.3 mmol/L — ABNORMAL HIGH (ref 3.5–5.1)
Sodium: 137 mmol/L (ref 135–145)
Total Bilirubin: 1.8 mg/dL — ABNORMAL HIGH (ref 0.3–1.2)
Total Protein: 5.4 g/dL — ABNORMAL LOW (ref 6.5–8.1)

## 2019-08-18 LAB — CBC WITH DIFFERENTIAL/PLATELET
Abs Immature Granulocytes: 0.84 10*3/uL — ABNORMAL HIGH (ref 0.00–0.07)
Basophils Absolute: 0.1 10*3/uL (ref 0.0–0.1)
Basophils Relative: 1 %
Eosinophils Absolute: 0 10*3/uL (ref 0.0–0.5)
Eosinophils Relative: 0 %
HCT: 29 % — ABNORMAL LOW (ref 36.0–46.0)
Hemoglobin: 9.4 g/dL — ABNORMAL LOW (ref 12.0–15.0)
Immature Granulocytes: 5 %
Lymphocytes Relative: 9 %
Lymphs Abs: 1.3 10*3/uL (ref 0.7–4.0)
MCH: 33.3 pg (ref 26.0–34.0)
MCHC: 32.4 g/dL (ref 30.0–36.0)
MCV: 102.8 fL — ABNORMAL HIGH (ref 80.0–100.0)
Monocytes Absolute: 1.3 10*3/uL — ABNORMAL HIGH (ref 0.1–1.0)
Monocytes Relative: 8 %
Neutro Abs: 12.1 10*3/uL — ABNORMAL HIGH (ref 1.7–7.7)
Neutrophils Relative %: 77 %
Platelets: DECREASED 10*3/uL (ref 150–400)
RBC: 2.82 MIL/uL — ABNORMAL LOW (ref 3.87–5.11)
RDW: 18.7 % — ABNORMAL HIGH (ref 11.5–15.5)
WBC: 15.6 10*3/uL — ABNORMAL HIGH (ref 4.0–10.5)
nRBC: 7.9 % — ABNORMAL HIGH (ref 0.0–0.2)

## 2019-08-18 LAB — SEDIMENTATION RATE: Sed Rate: 27 mm/hr — ABNORMAL HIGH (ref 0–22)

## 2019-08-18 LAB — PROTIME-INR
INR: 1 (ref 0.8–1.2)
Prothrombin Time: 13.2 seconds (ref 11.4–15.2)

## 2019-08-18 LAB — CBC
HCT: 28 % — ABNORMAL LOW (ref 36.0–46.0)
Hemoglobin: 9.1 g/dL — ABNORMAL LOW (ref 12.0–15.0)
MCH: 32.9 pg (ref 26.0–34.0)
MCHC: 32.5 g/dL (ref 30.0–36.0)
MCV: 101.1 fL — ABNORMAL HIGH (ref 80.0–100.0)
Platelets: 195 10*3/uL (ref 150–400)
RBC: 2.77 MIL/uL — ABNORMAL LOW (ref 3.87–5.11)
RDW: 18.5 % — ABNORMAL HIGH (ref 11.5–15.5)
WBC: 15.4 10*3/uL — ABNORMAL HIGH (ref 4.0–10.5)
nRBC: 3.9 % — ABNORMAL HIGH (ref 0.0–0.2)

## 2019-08-18 LAB — C-REACTIVE PROTEIN: CRP: 27.5 mg/dL — ABNORMAL HIGH (ref <1.0)

## 2019-08-18 LAB — RETICULOCYTES
Immature Retic Fract: 34.6 % — ABNORMAL HIGH (ref 2.3–15.9)
RBC.: 2.76 MIL/uL — ABNORMAL LOW (ref 3.87–5.11)
Retic Count, Absolute: 86.4 10*3/uL (ref 19.0–186.0)
Retic Ct Pct: 3.1 % (ref 0.4–3.1)

## 2019-08-18 LAB — LACTATE DEHYDROGENASE: LDH: 491 U/L — ABNORMAL HIGH (ref 98–192)

## 2019-08-18 MED ORDER — HEPARIN SODIUM (PORCINE) 5000 UNIT/ML IJ SOLN
5000.0000 [IU] | Freq: Three times a day (TID) | INTRAMUSCULAR | Status: DC
  Administered 2019-08-19 – 2019-08-24 (×16): 5000 [IU] via SUBCUTANEOUS
  Filled 2019-08-18 (×16): qty 1

## 2019-08-18 MED ORDER — ONDANSETRON HCL 4 MG/2ML IJ SOLN
4.0000 mg | Freq: Four times a day (QID) | INTRAMUSCULAR | Status: DC | PRN
  Administered 2019-08-20: 05:00:00 4 mg via INTRAVENOUS
  Filled 2019-08-18: qty 2

## 2019-08-18 MED ORDER — LACTATED RINGERS IV SOLN: INTRAVENOUS | Status: DC

## 2019-08-18 MED ORDER — ACETAMINOPHEN 650 MG RE SUPP: 650.0000 mg | Freq: Four times a day (QID) | RECTAL | Status: DC | PRN

## 2019-08-18 MED ORDER — ALBUTEROL SULFATE HFA 108 (90 BASE) MCG/ACT IN AERS
6.0000 | INHALATION_SPRAY | Freq: Once | RESPIRATORY_TRACT | Status: AC
  Administered 2019-08-18: 6 via RESPIRATORY_TRACT
  Filled 2019-08-18: qty 6.7

## 2019-08-18 MED ORDER — LACTATED RINGERS IV BOLUS
1000.0000 mL | Freq: Once | INTRAVENOUS | Status: AC
  Administered 2019-08-18: 22:00:00 1000 mL via INTRAVENOUS

## 2019-08-18 MED ORDER — MOMETASONE FURO-FORMOTEROL FUM 200-5 MCG/ACT IN AERO
2.0000 | INHALATION_SPRAY | Freq: Two times a day (BID) | RESPIRATORY_TRACT | Status: DC
  Administered 2019-08-19 – 2019-08-24 (×11): 2 via RESPIRATORY_TRACT
  Filled 2019-08-18: qty 8.8

## 2019-08-18 MED ORDER — LACTATED RINGERS IV BOLUS
1000.0000 mL | Freq: Once | INTRAVENOUS | Status: AC
  Administered 2019-08-19: 1000 mL via INTRAVENOUS

## 2019-08-18 MED ORDER — ACETAMINOPHEN 325 MG PO TABS
650.0000 mg | ORAL_TABLET | Freq: Four times a day (QID) | ORAL | Status: DC | PRN
  Administered 2019-08-19 – 2019-08-21 (×3): 650 mg via ORAL
  Filled 2019-08-18 (×3): qty 2

## 2019-08-18 MED ORDER — SODIUM CHLORIDE 0.9 % IV SOLN
1.0000 g | INTRAVENOUS | Status: DC
  Administered 2019-08-19 – 2019-08-20 (×3): 1 g via INTRAVENOUS
  Filled 2019-08-18: qty 10
  Filled 2019-08-18 (×2): qty 1

## 2019-08-18 MED ORDER — UMECLIDINIUM BROMIDE 62.5 MCG/INH IN AEPB
1.0000 | INHALATION_SPRAY | Freq: Every morning | RESPIRATORY_TRACT | Status: DC
  Administered 2019-08-19 – 2019-08-24 (×6): 1 via RESPIRATORY_TRACT
  Filled 2019-08-18: qty 7

## 2019-08-18 MED ORDER — SODIUM CHLORIDE 0.9% FLUSH
3.0000 mL | Freq: Two times a day (BID) | INTRAVENOUS | Status: DC
  Administered 2019-08-19 – 2019-08-24 (×10): 3 mL via INTRAVENOUS

## 2019-08-18 MED ORDER — ALBUTEROL SULFATE (2.5 MG/3ML) 0.083% IN NEBU
3.0000 mL | INHALATION_SOLUTION | RESPIRATORY_TRACT | Status: DC | PRN
  Administered 2019-08-24: 11:00:00 3 mL via RESPIRATORY_TRACT
  Filled 2019-08-18: qty 3

## 2019-08-18 MED ORDER — BUPROPION HCL ER (SR) 150 MG PO TB12
150.0000 mg | ORAL_TABLET | Freq: Every day | ORAL | Status: DC
  Administered 2019-08-19 – 2019-08-24 (×6): 150 mg via ORAL
  Filled 2019-08-18 (×6): qty 1

## 2019-08-18 MED ORDER — HYDROXYZINE HCL 25 MG PO TABS: 25.0000 mg | ORAL_TABLET | Freq: Three times a day (TID) | ORAL | Status: DC | PRN

## 2019-08-18 NOTE — H&P (Signed)
History and Physical    PLEASE NOTE THAT DRAGON DICTATION SOFTWARE WAS USED IN THE CONSTRUCTION OF THIS NOTE.   [name] KWI:[id number] DOB: 1945/12/19 DOA: 08/18/2019  PCP: Christain Sacramento, MD Patient coming from: home   I have personally briefly reviewed patient's old medical records in [place]  Chief Complaint: Generalized weakness  HPI: [name] is a 74 y.o. female with medical history significant for chronic hypoxic respiratory failure in the setting of severe COPD on 3 L continuous supplemental oxygen, essentially pretension, anxiety, who is admitted to [facility] on 08/18/2019 with acute renal failure after presenting from home to [facility] Emergency Department complaining of generalized weakness.   The patient reports progressive generalized weakness over the course of the last month, in the absence of any acute focal weakness.  She reports that she lives at home by herself, in the setting of this progressive generalized weakness is finding it more difficult to adequately independently perform her ADLs.  She also reports nausea for at least the last 2 weeks, after which time she reports approximately 2 episodes per day of nonbloody, nonbilious emesis.  Over the last month she also reports diminished appetite which, and tendon with the nausea/vomiting, has resulted in a significant decline in her intake of both food and fluid.  Denies any associated abdominal discomfort or diarrhea.  She also acknowledges dysuria in the absence of gross hematuria over the most recent 4 to 5 days.  She denies any associated urinary urgency or change in urinary frequency.  She also denies any decline in her daily volume of urine output recently.  Denies any associated subjective fever, chills, rigors, or generalized myalgias.  Denies any recent headache, neck stiffness, rhinitis, rhinorrhea, sore throat, cough.  In the setting of her severe COPD on 3 L continuous  supplemental oxygen at home, the patient denies any recent worsening of her mild baseline shortness of breath.  Denies any recent traveling or known Covid 19 exposures.  Denies any worsening chronic mild nonproductive cough.  She also reports a nonpainful, nonpruritic erythematous rash associated with her upper and lower extremities bilaterally.  She believes that this rash has been present in a similar distribution for at least the last month, but she is not completely sure as to the specific onset of this finding.  Denies any associated drainage.  She reports that she has been following with outpatient wound care for a poorly healing right lower extremity ulcerative lesion distal to the right knee on the anterior aspect of the right lower extremity.  In between her scheduled appointments with wound care clinic, the patient reports that a friend of hers applies a nonprescribed topical dressing that is 'holistic' in nature, but is unsure as to the specific identity of this topical approach.  The patient conveys that she previously consumed 4-5 alcoholic beverages on a daily basis for several years, but states that she committed to significant reduction in her alcohol consumption approximately 6 to 7 months ago.  Now, she reports that she consumes 1-2 alcoholic beverages, 1-2 times per month, without any recent binges of alcohol consumption.  She reports the most recent alcohol is consumed greater than 1 week ago.  Denies any use of recreational drugs.   Per chart review, it appears that there was outpatient documentation of the patient experiencing recurrent nausea and vomiting back in December as well.  It appears that the patient may have seen dermatology for the rash on  the bilateral upper and lower extremities back in December as well, although additional chart review is warranted for this.   In the setting of ongoing and progressive generalized weakness, described above, the patient had a follow-up  visit with her PCP earlier today, and the patient reports that in the absence of any interval improvement there of, that her PCP recommended that she present to the emergency department for further evaluation of such as well as his concern for progressive dehydration in the setting of diminished oral intake.  The patient reports that she was prescribed over the last 1 to 2 days an antibiotic by her PCP, although she does not recall the specific antibiotic or the underlying infection that it was intended to treat.  Per chart review, outpatient medication list includes doxycycline.  In asking patient specifically about doxycycline, she reports that she is unfamiliar with this medication is unsure if she has been taking it.  Notable additional medications on hold list include losartan as well as spironolactone.    ED Course:  Vital signs in the ED were notable for the following: Temperature max 98.0; heart rate 70-79; blood pressure 96/50 2-1 100/50; respiratory rate 13-22; and oxygen saturation 94 to 98% on 4 L nasal cannula.  Labs were notable for the following: CMP notable for the following: Sodium 137, potassium 5.3, chloride 98, bicarbonate 20, anion gap 19, BUN 87, creatinine 3.56 relative to most recent prior creatinine value of 0.44 in December 2019, albumin 2.2, alkaline phosphatase 282, AST 183, ALT 124, total bilirubin 1.8.  Of note, per chart review, it appears that the most recent previous liver enzymes were performed back in December 2019, at which time they were found to be within normal limits.  CBC notable for white blood cell count 15,600 with 77% neutrophils, hemoglobin 9.4 with MCV 103, relative to most recent prior hemoglobin value of 12.9 in December 2019 platelets 195.  Urinalysis was notable for a cloudy specimen with 21-50 white blood cells, many bacteria, moderate leukocyte esterace, positive hyaline casts, and no evidence of squamous epithelial cells.  Blood cultures x2 were  collected prior to initiation of any antibiotics.  Nasopharyngeal COVID-19 PCR swab was obtained, with result currently pending.  Chest x-ray showed no evidence of acute cardiopulmonary process, including no evidence of infiltrate, edema, effusion, or pneumothorax.  Renal ultrasound was ordered and performed in the ED, with result currently pending.  While in the ED, the following were administered: Lactated Ringer bolus x1 L, albuterol inhaler times x 6 puffs.  Subsequently, the patient was admitted to the stepdown unit for further evaluation and management presenting acute renal failure complicated by hyperkalemia as well as for sepsis due to underlying urinary tract infection.    Review of Systems: As per HPI otherwise 10 point review of systems negative.   Past Medical History:  Diagnosis Date  . Abrasion    under left knee x 2 places changing dressing q day of bid, applying ssd cream area healing due to fell 2 weeks ago  . Blood dyscrasia    BLEEDS EASY  . Bruises easily    bleeds easily  . Closed patellar sleeve fracture of left knee 01/04/2018  . Closed patellar sleeve fracture of right knee 01/04/2018   healing   . COPD (chronic obstructive pulmonary disease) ([place])   . Coronary artery disease    per dr Philis Kendall note 01-01-18 note  . Hernia, inguinal    LEFT  . Hypertension    states  under control with meds., has been on med. > 10 yr.  . On home oxygen therapy    at night 2L/min  . Pneumonia    "SEVERAL TIMES IN PAST, LAST TIME 2018"  . Pulmonary hypertension ([place])    per dr Terrence Dupont 7-19 19 lov note  . Shortness of breath dyspnea    with exertion  . SVD (spontaneous vaginal delivery)    x 1  . Thin skin   . Urinary tract infection 2019  . Wears partial dentures    lower partial and upper plate    Past Surgical History:  Procedure Laterality Date  . COLONOSCOPY    . CYSTOCELE REPAIR N/A 03/02/2018   Procedure: ANTERIOR REPAIR (CYSTOCELE);  Surgeon: Cheri Fowler, MD;  Location: [facility];  Service: Gynecology;  Laterality: N/A;  OUTPATIENT IN BED  . MULTIPLE TOOTH EXTRACTIONS    . RESECTION DISTAL CLAVICAL Left 10/12/2014   Procedure: DISTAL CLAVICLE EXCISION;  Surgeon: Kathryne Hitch, MD;  Location: [facility];  Service: Orthopedics;  Laterality: Left;  . SHOULDER ARTHROSCOPY WITH ROTATOR CUFF REPAIR AND SUBACROMIAL DECOMPRESSION Left 10/12/2014   Procedure: LEFT SHOULDER ARTHROSCOPY, DEBRIDEMENT WITH ACROMIOPLASTY,  ROTATOR CUFF REPAIR AND RELEASE BICEPS TENDON;  Surgeon: Kathryne Hitch, MD;  Location: [place];  Service: Orthopedics;  Laterality: Left;    Social History:  reports that she quit smoking about 2 years ago. Her smoking use included cigarettes. She has a 57.00 pack-year smoking history. She has never used smokeless tobacco. She reports current alcohol use of about 28.0 standard drinks of alcohol per week. She reports that she does not use drugs.   Allergies  Allergen Reactions  . Tramadol Other (See Comments)    GI UPSET    Family History  Problem Relation Age of Onset  . Heart disease Father   . Kidney failure Father     Prior to Admission medications   Medication Sig Start Date End Date Taking? Authorizing Provider  albuterol (PROAIR HFA) 108 (90 Base) MCG/ACT inhaler 2 puffs every 4 hours as needed only  if your can't catch your breath Patient taking differently: Inhale 2 puffs into the lungs every 4 (four) hours as needed (if unable to catch breath). 2 puffs every 4 hours as needed only  if your can't catch your breath 03/27/17  Yes Tanda Rockers, MD  albuterol (PROVENTIL) (2.5 MG/3ML) 0.083% nebulizer solution Take 3 mLs (2.5 mg total) by nebulization every 6 (six) hours as needed for wheezing or shortness of breath. 10/08/15  Yes Tanda Rockers, MD  Ascorbic Acid (VITAMIN C WITH ROSE HIPS) 500 MG tablet Take 500 mg by mouth daily.   Yes [provider]  budesonide-formoterol  (SYMBICORT) 160-4.5 MCG/ACT inhaler Inhale 2 puffs into the lungs 2 (two) times daily. 07/14/18  Yes Tanda Rockers, MD  buPROPion [facility] SR) 150 MG 12 hr tablet Take 150 mg by mouth daily.  05/26/17  Yes [provider]  cholecalciferol (VITAMIN D) 1000 UNITS tablet Take 1,000 Units by mouth daily.   Yes [provider]  diltiazem (CARDIZEM CD) 360 MG 24 hr capsule Take 360 mg by mouth daily.  12/05/17  Yes [provider]  doxycycline (VIBRA-TABS) 100 MG tablet Take 100 mg by mouth 2 (two) times daily. 08/11/19  Yes [provider]  furosemide (LASIX) 20 MG tablet Take 40 mg by mouth daily as needed for fluid.    Yes [provider]  Garlic 250 MG  TABS Take 1 tablet by mouth daily.   Yes [provider]  HYDROcodone-acetaminophen (NORCO) 7.5-325 MG tablet Take 1 tablet by mouth every 4 (four) hours. 08/03/19  Yes [provider]  hydrOXYzine (ATARAX/VISTARIL) 25 MG tablet Take 25 mg by mouth 3 (three) times daily as needed for anxiety. 08/03/19  Yes [provider]  losartan (COZAAR) 100 MG tablet Take 100 mg by mouth daily. 12/10/15  Yes [provider]  Multiple Minerals-Vitamins (CAL MAG ZINC +D3 PO) Take 1 tablet by mouth at bedtime.   Yes [provider]  ondansetron (ZOFRAN) 4 MG tablet Take 2-4 mg by mouth every 6 (six) hours as needed for nausea/vomiting. 07/21/19  Yes [provider]  potassium chloride (K-DUR) 10 MEQ tablet Take 10 mEq by mouth daily as needed (swelling). When takes lasix  10/13/17  Yes [provider]  spironolactone (ALDACTONE) 25 MG tablet Take 1 tablet by mouth 2 (two) times daily. 06/03/18  Yes [provider]  Tiotropium Bromide Monohydrate (SPIRIVA RESPIMAT) 2.5 MCG/ACT AERS INHALE 2 PUFFS INTO THE LUNGS EVERY MORNING Patient taking differently: Inhale 2 puffs into the lungs every morning.  01/12/19  Yes Tanda Rockers, MD  budesonide-formoterol  (SYMBICORT) 160-4.5 MCG/ACT inhaler Inhale 2 puffs into the lungs 2 (two) times daily for 1 day. 02/10/19 02/11/19  Tanda Rockers, MD  HYDROcodone-acetaminophen (NORCO/VICODIN) 5-325 MG tablet Take 1 tablet by mouth every 4 (four) hours as needed. Patient not taking: Reported on 08/18/2019 05/06/18   Tanda Rockers, MD  HYDROcodone-homatropine [facility]) 5-1.5 MG/5ML syrup Take 5 mLs by mouth every 6 (six) hours as needed for cough. Patient not taking: Reported on 08/18/2019 05/20/18   Alma Friendly, MD  OXYGEN Inhale 3 L into the lungs. 24/7 DME- [place]    [provider]  predniSONE (DELTASONE) 10 MG tablet Take 1 tablet (10 mg total) by mouth daily with breakfast. Patient not taking: Reported on 08/18/2019 02/10/19   Tanda Rockers, MD  Respiratory Therapy Supplies (FLUTTER) DEVI Use as directed 02/05/16   Tanda Rockers, MD  Tiotropium Bromide Monohydrate (SPIRIVA RESPIMAT) 2.5 MCG/ACT AERS Inhale 2 puffs into the lungs daily for 1 day. 02/10/19 02/11/19  Tanda Rockers, MD     Objective    Physical Exam: Vitals:   08/18/19 1834 08/18/19 1900 08/18/19 1930 08/18/19 2000  BP:  (!) 98/56 (!) 100/50 (!) 96/52  Pulse:  73 70 72  Resp:  _0 Temp:      TempSrc:      SpO2:  (!) 81% 98% 96%  Weight: 54.4 kg     Height: 5' 2" (1.575 m)       General: appears to be stated age; alert, oriented Skin: warm, dry; ulcerative lesion noted on the anterior aspect of the right lower extremity distal to the right knee in the absence of any associated drainage Head:  AT/[place] Eyes:  PEARL b/l, EOMI Mouth:  Oral mucosa membranes appear dry, normal dentition Neck: supple; trachea midline Heart:  RRR; did not appreciate any M/R/G Lungs: Mild bilateral expiratory wheezes noted; otherwise, CTAB; did not appreciate any rales or rhonchi Abdomen: + BS; soft, ND, NT Extremities: Trace edema in the bilateral lower extremities; ulcerative lesion associated with the anterior aspect of the right  lower extremity distal to the right knee, as further described above Neuro: strength and sensation intact in upper and lower extremities b/l   Labs on Admission: I have personally reviewed following  labs and imaging studies  CBC: Recent Labs  Lab 08/18/19 1919  WBC 15.6*  NEUTROABS 12.1*  HGB 9.4*  HCT 29.0*  MCV 102.8*  PLT PLATELET CLUMPS NOTED ON SMEAR, COUNT APPEARS DECREASED   Basic Metabolic Panel: Recent Labs  Lab 08/18/19 1919  NA 137  K 5.3*  CL 98  CO2 20*  GLUCOSE 87  BUN 87*  CREATININE 3.56*  CALCIUM 8.6*   GFR: Estimated Creatinine Clearance: 11.1 mL/min (A) (by C-G formula based on SCr of 3.56 mg/dL (H)). Liver Function Tests: Recent Labs  Lab 08/18/19 1919  AST 183*  ALT 124*  ALKPHOS 282*  BILITOT 1.8*  PROT 5.4*  ALBUMIN 2.2*   No results for input(s): LIPASE, AMYLASE in the last 168 hours. No results for input(s): AMMONIA in the last 168 hours. Coagulation Profile: Recent Labs  Lab 08/18/19 1919  INR 1.0   Cardiac Enzymes: No results for input(s): CKTOTAL, CKMB, CKMBINDEX, TROPONINI in the last 168 hours. BNP (last 3 results) No results for input(s): PROBNP in the last 8760 hours. HbA1C: No results for input(s): HGBA1C in the last 72 hours. CBG: No results for input(s): GLUCAP in the last 168 hours. Lipid Profile: No results for input(s): CHOL, HDL, LDLCALC, TRIG, CHOLHDL, LDLDIRECT in the last 72 hours. Thyroid Function Tests: No results for input(s): TSH, T4TOTAL, FREET4, T3FREE, THYROIDAB in the last 72 hours. Anemia Panel: No results for input(s): VITAMINB12, FOLATE, FERRITIN, TIBC, IRON, RETICCTPCT in the last 72 hours. Urine analysis:    Component Value Date/Time   COLORURINE STRAW (A) 05/09/2018 0646   APPEARANCEUR CLEAR 05/09/2018 0646   LABSPEC 1.005 05/09/2018 0646   PHURINE 7.0 05/09/2018 0646   GLUCOSEU 150 (A) 05/09/2018 0646   HGBUR NEGATIVE 05/09/2018 0646   BILIRUBINUR NEGATIVE 05/09/2018 0646   KETONESUR 5  (A) 05/09/2018 0646   PROTEINUR NEGATIVE 05/09/2018 0646   NITRITE NEGATIVE 05/09/2018 0646   LEUKOCYTESUR NEGATIVE 05/09/2018 0646    Radiological Exams on Admission: DG Chest 2 View  Result Date: 08/18/2019 CLINICAL DATA:  Increasing weakness EXAM: CHEST - 2 VIEW COMPARISON:  05/15/2018 FINDINGS: Cardiac shadow is stable. Mild aortic calcifications are seen. The lungs are hyperinflated consistent with COPD. Mild scarring is noted in the bases bilaterally. No focal infiltrate is seen. No sizable effusion is noted. IMPRESSION: COPD. Bibasilar scarring. Electronically Signed   By: Inez Catalina M.D.   On: 08/18/2019 19:49    Assessment/Plan   [name] is a 74 y.o. female with medical history significant for chronic hypoxic respiratory failure in the setting of severe COPD on 3 L continuous supplemental oxygen, essentially pretension, anxiety, who is admitted to [facility] on 08/18/2019 with acute renal failure after presenting from home to [facility] Emergency Department complaining of generalized weakness.    Principal Problem:   ARF (acute renal failure) (HCC) Active Problems:   COPD GOLD III   Chronic respiratory failure with hypoxia (HCC)   Hyperkalemia   Transaminitis   Generalized weakness   Leukocytosis   Anemia   #) Acute renal failure: Presenting labs reflect serum creatinine of 3.56, which is elevated relative to most recent prior creatinine data point that I can identify in her chart, which was 0.44 back in December 2019.  Suspect that patient's acute renal failure is multifactorial in source, with intravascular depletion contribution stemming from dehydration due to at least 2 weeks of daily vomiting as well as diminished oral intake of fluid over that time while  continuing to take spironolactone, with potential additional pharmacologic exacerbation in the form of outpatient losartan.  At the present time, there does not appear to be an indication for  urgent hemodialysis overnight, but will closely monitor the patient's acid/base status as well as hyperkalemia and overall volume status,, as further described below.  Of note, presenting urinalysis is associated with the presence of hyaline casts, consistent with suspected element of contributory dehydration.   Plan: Add on random urine sodium as well as random urine creatinine to previously collected urine specimen.  Monitor strict I's and O's and daily weights.  Attempt to avoid nephrotoxic agents.  As such we will hold home losartan for now, and also hold home spironolactone in the setting of an overall conical picture of dehydration with hyperkalemia.  Lactated Ringer's at 100 cc/h overnight.  Repeat BMP at 0100 on 08/19/2019 to monitor interval trend in serum potassium level.  We will then repeat CMP in the morning.  Check serum magnesium level as well as serum phosphorus level. Will follow for results of renal ultrasound was performed in the ED this evening.     #) Hyperkalemia presenting serum potassium found to be mildly elevated at 5.3.  This also appears to be multifactorial in nature, with contributions from dehydration, aforementioned acute renal failure, and pharmacologic contributions that include the potassium sparing nature of outpatient spironolactone.  Interpretation of presenting EKG, while somewhat limited due to associated artifact, appears to demonstrate sinus rhythm with nonspecific intraventricular conduction delay, heart rate 74, and no evidence of T wave or ST changes, including no evidence of peaked T waves.  The patient has received a 1 L lactated Ringer bolus in the ED, followed by initiation of lactated Ringer's running at 100 cc/h.  Plan: Repeat BMP 0100 on 08/19/2019, and if no interval decline in serum potassium level, will then initiate Kayexalate.  Add on serum magnesium level, which will be beneficial giving consideration for administration of calcium gluconate for  cardioprotective purposes.  In the meantime continue lactated Ringer's at 100 cc/h.  Monitor on telemetry.  Hold losartan as well as spironolactone.  Work-up and management of presenting acute renal failure, as further described above.  Repeat BMP in the morning.     #) Transaminitis: Presenting labs reflect mild elevation in liver enzymes without overwhelming evidence of a concomitant cholestatic pattern.  The acuity of the presenting elevated transaminases is not currently clear to me, as the most recent prior set of liver enzymes that I have identified via chart review occurred in December 2019, at which time patient's liver enzymes appear to be within normal limits.  I consider the possibility of acute alcoholic hepatitis given the patient's prior history of alcohol abuse, but the patient insists that she is not been consuming more than 1-2 drinks 1-2 times per month over the last 6 months without any recent binges in alcohol consumption, which would diminish likelihood of acute alcoholic hepatitis.  Regardless, even if the patient did have acute alcoholic hepatitis, management would not change overnight as steroids would be contraindicated in the setting of acute renal failure.  Given accompanying gastrointestinal symptoms, will also check acute viral hepatitis panel.  I will also test her for additional chart review to determine the timing of the patient's use of doxycycline, as this medication can be associated with side effects that include transaminitis and rash.  Physical exam of the patient's abdomen is unremarkable, including no evidence of peritoneal signs.   Plan: Repeat CMP in the  morning.  Check direct bilirubin.  Check GGT. I have ordered a right upper quadrant ultrasound to further evaluate for any evidence of hepatic infiltrate versus gallbladder pathology.  Check acute viral hepatitis panel.  Check INR.  Check urinary drug screen.  Add-on serum ethanol level.     #) Generalized  weakness: The patient reports progressive generalized weakness over the last several weeks or more in the absence of any associated acute focal weakness, which she reports has made it progressively more difficult for her to independently perform her ADLs at home, where she lives alone.  Objectively, no evidence of acute focal weakness on exam.  Suspect that patient's progressive generalized weakness is multifactorial in etiology, with contributions from presenting dehydration, acute renal failure, severe sepsis due to presenting urinary tract infection, as further described above.   Plan: Work-up and management of severe sepsis due to urinary tract infection, as further described below.  Work-up and management of presenting acute renal failure, as described above, including interval administration of IV fluids.  Check reflex TSH.  I have placed a physical therapy consult to occur in the morning.      #) Macrocytic anemia: Presenting CBC notable for hemoglobin of 9.4, with MCV 103.  The specific acuity of this anemic finding is currently unclear to me as the most recent prior hemoglobin data point that I have reported comparison occurred in December 2019, at which time hemoglobin was noted to be 12.9.  The differential for patient's presenting macrocytic anemia includes a history of alcohol abuse, underlying liver disease in the context of elevated transaminases, as described above, vitamin B 12 versus folic acid deficiency, as well as myeloproliferative disorders.  No evidence of active bleed at this time.   Plan: will initiate anemia work-up in the form of checking ferritin, TIBC, total iron, reticulocyte count, peripheral smear, methylmalonic acid level, folic acid level.  Repeat CBC in the morning.  Check INR.      #) Severe sepsis due to urinary tract infection: Diagnosis on the basis of patient's report of 4 to 5 days of dysuria, with presenting urinalysis demonstrating significant pyuria, many  bacteria, and no evidence of squamous epithelial cells.  SIRS criteria are met via presenting leukocytosis as well as tachypnea.  Criteria are met for patient sepsis to be considered severe in nature on the basis of concomitant evidence of presenting endorgan damage in the form of presenting acute renal failure.  Blood cultures x2 were collected in the ED. no other source of underlying infectious process has been identified at the present time, including chest x-ray shows no evidence of acute cardiopulmonary process.  Although nasopharyngeal COVID-19 PCR remains pending at this time.   Plan: Continue IV fluids as above.  Stat lactic acid, with repeat value to be checked 3 hours from now.  Start Rocephin.  Add on urine culture to urine specimen that was collected earlier this evening.  Repeat CBC differential in the morning.  Follow for result of nasopharyngeal COVID-19 PCR performed in the ED this evening.  We will also monitor for result of right upper quadrant abdominal ultrasound.       #) Chronic hypoxic respiratory failure in the setting of severe COPD: Patient reports that she is on 3 L continuous supplemental oxygen via nasal cannula as her baseline supplemental O2 requirement.  She acknowledges a long prior smoking history of greater than 60 pack years, but reports that she completely quit smoking greater than 2 years ago, without any subsequent  resumption.  Her outpatient respiratory regimen consists of Symbicort, tiotropium, and as needed albuterol.  No evidence of present time to suggest acute exacerbation of underlying COPD.  Of note, presenting chest x-ray showed no evidence of acute cardiopulmonary process.  Plan: Continue home Symbicort and tiotropium.  As needed albuterol inhaler.  Monitor on continuous pulse oximetry.     #) Essential hypertension: Outpatient antihypertensive regimen includes diltiazem, losartan, and spironolactone.  Systolic blood pressures in the ED have been  somewhat soft, with systolic range noted to be in the 90's - 110's mmHg. in the context of borderline hypotension along with acute renal failure and hyperkalemia, will hold all home antihypertensive medications for now.  Plan: Hold home antihypertensive medications for now, as further described above.  Close monitoring of ensuing blood pressures via routine vital signs.     #) Generalized anxiety disorder: On Wellbutrin as well as as needed hydroxyzine at home.  Plan: Continue home Wellbutrin and as needed hydroxyzine.    DVT prophylaxis: Lovenox 4 mg subcu daily Code Status: Full code Family Communication: none Disposition Plan: Per Rounding Team Consults called: none  Admission status: Inpatient; stepdown unit    PLEASE NOTE THAT DRAGON DICTATION SOFTWARE WAS USED IN THE CONSTRUCTION OF THIS NOTE.   [facility] Pager [phone] From [place].   Otherwise, please contact night-coverage  [email or web address] Password [facility]  08/18/2019, 9:18 PM

## 2019-08-18 NOTE — ED Triage Notes (Signed)
Per EMS, pt showed to PCP office with possible COPD exacerbation and c/o weakness and poor appetite x "long time" and bilateral arm wounds that are increasing in size/pain

## 2019-08-18 NOTE — ED Notes (Signed)
Pt sitting up in bed. Full monitor on. Pt's o2 increased to 5L [place]. MD informed. Will continue to monitor.

## 2019-08-18 NOTE — ED Provider Notes (Signed)
[facility] DEPT Provider Note   CSN: [id number] Arrival date & time: 08/18/19  1801     History Chief Complaint  Patient presents with  . Arm Pain    [name] is a 74 y.o. female.  HPI     74 year old female comes in a chief complaint of arm pain. Patient has history of advanced COPD, CAD.  She is on 2 L oxygen at home.  She comes in after her PCP advised her to come to the ER because of her bruising/rash to the extremities.  Patient is not a good historian.  She is unsure how long she has had rash, that we noticed all over her body.  She suspects that the rash has been present for at least 3 months.  There is some itching, pain with the rash.  She has been taking care of the wound at home.  She used to see a wound doctor last year, but she is not quite sure what was being done at that time.  Patient denies any new antibiotics or medications.  Review of system is negative for any fevers, chills, COVID-19 exposures.  She is allergic to tramadol but is unsure what the reaction was.  Past Medical History:  Diagnosis Date  . Abrasion    under left knee x 2 places changing dressing q day of bid, applying ssd cream area healing due to fell 2 weeks ago  . Blood dyscrasia    BLEEDS EASY  . Bruises easily    bleeds easily  . Closed patellar sleeve fracture of left knee 01/04/2018  . Closed patellar sleeve fracture of right knee 01/04/2018   healing   . COPD (chronic obstructive pulmonary disease) ([place])   . Coronary artery disease    per dr Philis Kendall note 01-01-18 note  . Hernia, inguinal    LEFT  . Hypertension    states under control with meds., has been on med. > 10 yr.  . On home oxygen therapy    at night 2L/min  . Pneumonia    "SEVERAL TIMES IN PAST, LAST TIME 2018"  . Pulmonary hypertension ([place])    per dr Terrence Dupont 7-19 19 lov note  . Shortness of breath dyspnea    with exertion  . SVD (spontaneous vaginal delivery)    x 1  .  Thin skin   . Urinary tract infection 2019  . Wears partial dentures    lower partial and upper plate    Patient Active Problem List   Diagnosis Date Noted  . ARF (acute renal failure) ([place]) 08/18/2019  . Hyperkalemia 08/18/2019  . Transaminitis 08/18/2019  . Generalized weakness 08/18/2019  . Leukocytosis 08/18/2019  . Anemia 08/18/2019  . Chronic respiratory failure with hypoxia and hypercapnia ([place]) 06/02/2018  . COPD with acute exacerbation ([place]) 05/09/2018  . Hyponatremia 05/09/2018  . Acute urinary retention 05/09/2018  . Cystocele with prolapse 03/02/2018  . Cor pulmonale, chronic ([place]) 06/29/2017  . Chronic respiratory failure with hypoxia ([place]) 06/30/2014  . Pulmonary infiltrates 01/26/2013  . Nodule of right lung 08/19/2012  . Cancer screening 05/12/2012  . COPD exacerbation ([place]) 05/12/2012  . Essential hypertension 03/28/2012  . COPD GOLD III 12/02/2011  . Chronic cough 12/02/2011  . Cigarette smoker 12/02/2011    Past Surgical History:  Procedure Laterality Date  . COLONOSCOPY    . CYSTOCELE REPAIR N/A 03/02/2018   Procedure: ANTERIOR REPAIR (CYSTOCELE);  Surgeon: Cheri Fowler, MD;  Location: [facility];  Service:  Gynecology;  Laterality: N/A;  OUTPATIENT IN BED  . MULTIPLE TOOTH EXTRACTIONS    . RESECTION DISTAL CLAVICAL Left 10/12/2014   Procedure: DISTAL CLAVICLE EXCISION;  Surgeon: Kathryne Hitch, MD;  Location: [facility];  Service: Orthopedics;  Laterality: Left;  . SHOULDER ARTHROSCOPY WITH ROTATOR CUFF REPAIR AND SUBACROMIAL DECOMPRESSION Left 10/12/2014   Procedure: LEFT SHOULDER ARTHROSCOPY, DEBRIDEMENT WITH ACROMIOPLASTY,  ROTATOR CUFF REPAIR AND RELEASE BICEPS TENDON;  Surgeon: Kathryne Hitch, MD;  Location: [facility];  Service: Orthopedics;  Laterality: Left;     OB History   No obstetric history on file.     Family History  Problem Relation Age of Onset  . Heart disease Father   . Kidney failure Father      Social History   Tobacco Use  . Smoking status: Former Smoker    Packs/day: 1.00    Years: 57.00    Pack years: 57.00    Types: Cigarettes    Quit date: 03/20/2017    Years since quitting: 2.4  . Smokeless tobacco: Never Used  . Tobacco comment: <1ppd 02/05/2016  Substance Use Topics  . Alcohol use: Yes    Alcohol/week: 28.0 standard drinks    Types: 14 Cans of beer, 14 Shots of liquor per week    Comment: several beers/day and "a shot of whiskey"  . Drug use: No    Home Medications Prior to Admission medications   Medication Sig Start Date End Date Taking? Authorizing Provider  albuterol (PROAIR HFA) 108 (90 Base) MCG/ACT inhaler 2 puffs every 4 hours as needed only  if your can't catch your breath Patient taking differently: Inhale 2 puffs into the lungs every 4 (four) hours as needed (if unable to catch breath). 2 puffs every 4 hours as needed only  if your can't catch your breath 03/27/17  Yes Tanda Rockers, MD  albuterol (PROVENTIL) (2.5 MG/3ML) 0.083% nebulizer solution Take 3 mLs (2.5 mg total) by nebulization every 6 (six) hours as needed for wheezing or shortness of breath. 10/08/15  Yes Tanda Rockers, MD  Ascorbic Acid (VITAMIN C WITH ROSE HIPS) 500 MG tablet Take 500 mg by mouth daily.   Yes [provider]  budesonide-formoterol (SYMBICORT) 160-4.5 MCG/ACT inhaler Inhale 2 puffs into the lungs 2 (two) times daily. 07/14/18  Yes Tanda Rockers, MD  buPROPion [facility] SR) 150 MG 12 hr tablet Take 150 mg by mouth daily.  05/26/17  Yes [provider]  cholecalciferol (VITAMIN D) 1000 UNITS tablet Take 1,000 Units by mouth daily.   Yes [provider]  diltiazem (CARDIZEM CD) 360 MG 24 hr capsule Take 360 mg by mouth daily.  12/05/17  Yes [provider]  doxycycline (VIBRA-TABS) 100 MG tablet Take 100 mg by mouth 2 (two) times daily. 08/11/19  Yes [provider]  furosemide (LASIX) 20 MG tablet Take 40 mg by mouth daily as  needed for fluid.    Yes [provider]  Garlic 428 MG TABS Take 1 tablet by mouth daily.   Yes [provider]  HYDROcodone-acetaminophen (NORCO) 7.5-325 MG tablet Take 1 tablet by mouth every 4 (four) hours. 08/03/19  Yes [provider]  hydrOXYzine (ATARAX/VISTARIL) 25 MG tablet Take 25 mg by mouth 3 (three) times daily as needed for anxiety. 08/03/19  Yes [provider]  losartan (COZAAR) 100 MG tablet Take 100 mg by mouth daily. 12/10/15  Yes [provider]  Multiple Minerals-Vitamins (CAL MAG ZINC +D3  PO) Take 1 tablet by mouth at bedtime.   Yes [provider]  ondansetron (ZOFRAN) 4 MG tablet Take 2-4 mg by mouth every 6 (six) hours as needed for nausea/vomiting. 07/21/19  Yes [provider]  potassium chloride (K-DUR) 10 MEQ tablet Take 10 mEq by mouth daily as needed (swelling). When takes lasix  10/13/17  Yes [provider]  spironolactone (ALDACTONE) 25 MG tablet Take 1 tablet by mouth 2 (two) times daily. 06/03/18  Yes [provider]  Tiotropium Bromide Monohydrate (SPIRIVA RESPIMAT) 2.5 MCG/ACT AERS INHALE 2 PUFFS INTO THE LUNGS EVERY MORNING Patient taking differently: Inhale 2 puffs into the lungs every morning.  01/12/19  Yes Tanda Rockers, MD  budesonide-formoterol (SYMBICORT) 160-4.5 MCG/ACT inhaler Inhale 2 puffs into the lungs 2 (two) times daily for 1 day. 02/10/19 02/11/19  Tanda Rockers, MD  HYDROcodone-acetaminophen (NORCO/VICODIN) 5-325 MG tablet Take 1 tablet by mouth every 4 (four) hours as needed. Patient not taking: Reported on 08/18/2019 05/06/18   Tanda Rockers, MD  HYDROcodone-homatropine [facility]) 5-1.5 MG/5ML syrup Take 5 mLs by mouth every 6 (six) hours as needed for cough. Patient not taking: Reported on 08/18/2019 05/20/18   Alma Friendly, MD  OXYGEN Inhale 3 L into the lungs. 24/7 DME- [facility]    [provider]  predniSONE (DELTASONE) 10 MG tablet Take 1 tablet (10  mg total) by mouth daily with breakfast. Patient not taking: Reported on 08/18/2019 02/10/19   Tanda Rockers, MD  Respiratory Therapy Supplies (FLUTTER) DEVI Use as directed 02/05/16   Tanda Rockers, MD  Tiotropium Bromide Monohydrate (SPIRIVA RESPIMAT) 2.5 MCG/ACT AERS Inhale 2 puffs into the lungs daily for 1 day. 02/10/19 02/11/19  Tanda Rockers, MD    Allergies    Tramadol  Review of Systems   Review of Systems  Constitutional: Positive for activity change.  Respiratory: Negative for cough and shortness of breath.   Cardiovascular: Negative for chest pain.  Gastrointestinal: Positive for nausea.  Genitourinary: Negative for dysuria.  Skin: Positive for rash.  Allergic/Immunologic: Positive for immunocompromised state.  Hematological: Does not bruise/bleed easily.    Physical Exam Updated Vital Signs BP (!) 96/52   Pulse 72   Temp 98 F (36.7 C) (Oral)   Resp 15   Ht _0  (1.575 m)   Wt 54.4 kg   SpO2 96%   BMI 21.95 kg/m   Physical Exam Vitals and nursing note reviewed.  Constitutional:      Appearance: She is well-developed.  HENT:     Head: Normocephalic and atraumatic.  Cardiovascular:     Rate and Rhythm: Normal rate.  Pulmonary:     Effort: Pulmonary effort is normal.  Abdominal:     General: Bowel sounds are normal.  Musculoskeletal:     Cervical back: Normal range of motion and neck supple.  Skin:    General: Skin is warm and dry.     Findings: Bruising and rash present.     Comments: Diffuse rash over bilateral upper and lower extremities along with upper part of her chest.  The rash is a erythematous plaque, with areas of excoriation and scaling.  There is no sloughing of the skin.   Neurological:     Mental Status: She is alert and oriented to person, place, and time.     ED Results / Procedures / Treatments   Labs (all labs ordered are listed, but only abnormal results are displayed) Labs Reviewed  COMPREHENSIVE  METABOLIC PANEL - Abnormal;  Notable for the following components:      Result Value   Potassium 5.3 (*)    CO2 20 (*)    BUN 87 (*)    Creatinine, Ser 3.56 (*)    Calcium 8.6 (*)    Total Protein 5.4 (*)    Albumin 2.2 (*)    AST 183 (*)    ALT 124 (*)    Alkaline Phosphatase 282 (*)    Total Bilirubin 1.8 (*)    GFR calc non Af Amer 12 (*)    GFR calc Af Amer 14 (*)    Anion gap 19 (*)    All other components within normal limits  CBC WITH DIFFERENTIAL/PLATELET - Abnormal; Notable for the following components:   WBC 15.6 (*)    RBC 2.82 (*)    Hemoglobin 9.4 (*)    HCT 29.0 (*)    MCV 102.8 (*)    RDW 18.7 (*)    nRBC 7.9 (*)    Neutro Abs 12.1 (*)    Monocytes Absolute 1.3 (*)    Abs Immature Granulocytes 0.84 (*)    All other components within normal limits  SEDIMENTATION RATE - Abnormal; Notable for the following components:   Sed Rate 27 (*)    All other components within normal limits  LACTATE DEHYDROGENASE - Abnormal; Notable for the following components:   LDH 491 (*)    All other components within normal limits  SARS CORONAVIRUS 2 (TAT 6-24 HRS)  CULTURE, BLOOD (ROUTINE X 2)  CULTURE, BLOOD (ROUTINE X 2)  PROTIME-INR  C-REACTIVE PROTEIN  CBC  URINALYSIS, ROUTINE W REFLEX MICROSCOPIC    EKG EKG Interpretation  Date/Time:  Thursday August 18 2019 18:24:39 EST Ventricular Rate:  74 PR Interval:    QRS Duration: 118 QT Interval:  358 QTC Calculation: 398 R Axis:   87 Text Interpretation: Sinus rhythm Nonspecific intraventricular conduction delay Minimal ST elevation, inferior leads No acute changes no sig Confirmed by Varney Biles ([id number]) on 08/18/2019 7:25:47 PM   Radiology DG Chest 2 View  Result Date: 08/18/2019 CLINICAL DATA:  Increasing weakness EXAM: CHEST - 2 VIEW COMPARISON:  05/15/2018 FINDINGS: Cardiac shadow is stable. Mild aortic calcifications are seen. The lungs are hyperinflated consistent with COPD. Mild scarring is noted in the bases bilaterally. No focal  infiltrate is seen. No sizable effusion is noted. IMPRESSION: COPD. Bibasilar scarring. Electronically Signed   By: Inez Catalina M.D.   On: 08/18/2019 19:49    Procedures .Critical Care Performed by: Varney Biles, MD Authorized by: Varney Biles, MD   Critical care provider statement:    Critical care time (minutes):  32   Critical care was necessary to treat or prevent imminent or life-threatening deterioration of the following conditions:  Respiratory failure   Critical care was time spent personally by me on the following activities:  Discussions with consultants, evaluation of patient's response to treatment, examination of patient, ordering and performing treatments and interventions, ordering and review of laboratory studies, ordering and review of radiographic studies, pulse oximetry, re-evaluation of patient's condition, obtaining history from patient or surrogate and review of old charts   (including critical care time)  Medications Ordered in ED Medications  lactated ringers bolus 1,000 mL (has no administration in time range)  lactated ringers infusion (has no administration in time range)  albuterol (VENTOLIN HFA) 108 (90 Base) MCG/ACT inhaler 6 puff (6 puffs Inhalation Given 08/18/19 1956)    ED Course  I have reviewed the triage vital signs and the nursing notes.  Pertinent labs & imaging results that were available during my care of the patient were reviewed by me and considered in my medical decision making (see chart for details).  Clinical Course as of Aug 17 2201  Thu Aug 18, 2019  2201 Elevated LFTs noted.  Unclear etiology.  No abdominal pain.  Comprehensive metabolic panel(!) [AN]  7371 Patient has significantly elevated white count with left shift.  Clinically not septic.  WBC(!): 15.6 [AN]  2202 Patient is noted to have BUN of 87 and creatinine of 3.5.  Both acutely elevated.  This could explain her nausea, weakness.  We will get ultrasound renal.  Liter of  LR ordered.  Creatinine(!): 3.56 [AN]  2202 Chest x-ray is reassuring.  Patient is requiring increased oxygen.  COVID-19 test is negative.   [AN]    Clinical Course User Index [AN] Varney Biles, MD   MDM Rules/Calculators/A&P                     [name] was evaluated in Emergency Department on 08/18/2019 for the symptoms described in the history of present illness. She was evaluated in the context of the global COVID-19 pandemic, which necessitated consideration that the patient might be at risk for infection with the SARS-CoV-2 virus that causes COVID-19. Institutional protocols and algorithms that pertain to the evaluation of patients at risk for COVID-19 are in a state of rapid change based on information released by regulatory bodies including the CDC and federal and state organizations. These policies and algorithms were followed during the patient's care in the ED.   Patient comes in a chief complaint of weakness and rash.  She also complains of some nausea and anorexia.  She is not a good historian and has advanced COPD with 2 L of oxygen, CAD.  She is noted to have a rash.  She used to see wound specialist for her lower extremity wound.  At this time she is having diffuse rash which appears to have erupted over the past 2 or 3 months.  No new medications.  No history of severe allergic reaction like this.  She has no ocular complaints.  Although we considered Katherina Right, PE and in the differential, overall suspicion for that is low given the duration of the rash.  She could have other reasons to have disseminated rash like this including paraneoplastic syndrome.  There also could be superimposed cellulitis.   Patient is noted to be requiring increased oxygen.  COVID-19 is in the differential along with simple COPD exacerbation.  Finally patient has nausea, difficulty in ambulating.  The symptoms could be because of significant derangement in electrolytes, severe anemia.  Full  work-up has been initiated.  Anticipate admission.  Final Clinical Impression(s) / ED Diagnoses Final diagnoses:  AKI (acute kidney injury) ([place])  Elevated LFTs  Rash  Acute on chronic respiratory failure with hypoxia [facility])      Rx / DC Orders ED Discharge Orders    None       Varney Biles, MD 08/18/19 2203

## 2019-08-18 NOTE — ED Notes (Signed)
COVID swab performed. Full monitor on. Will continue to monitor.

## 2019-08-19 ENCOUNTER — Inpatient Hospital Stay (HOSPITAL_COMMUNITY): Payer: Medicare Other

## 2019-08-19 ENCOUNTER — Other Ambulatory Visit: Payer: Self-pay

## 2019-08-19 DIAGNOSIS — R6520 Severe sepsis without septic shock: Secondary | ICD-10-CM | POA: Diagnosis present

## 2019-08-19 DIAGNOSIS — A419 Sepsis, unspecified organism: Secondary | ICD-10-CM | POA: Diagnosis present

## 2019-08-19 DIAGNOSIS — L8990 Pressure ulcer of unspecified site, unspecified stage: Secondary | ICD-10-CM | POA: Insufficient documentation

## 2019-08-19 DIAGNOSIS — I959 Hypotension, unspecified: Secondary | ICD-10-CM

## 2019-08-19 DIAGNOSIS — N390 Urinary tract infection, site not specified: Secondary | ICD-10-CM

## 2019-08-19 LAB — CBC
HCT: 25.8 % — ABNORMAL LOW (ref 36.0–46.0)
Hemoglobin: 8.7 g/dL — ABNORMAL LOW (ref 12.0–15.0)
MCH: 33.7 pg (ref 26.0–34.0)
MCHC: 33.7 g/dL (ref 30.0–36.0)
MCV: 100 fL (ref 80.0–100.0)
Platelets: 164 10*3/uL (ref 150–400)
RBC: 2.58 MIL/uL — ABNORMAL LOW (ref 3.87–5.11)
RDW: 18 % — ABNORMAL HIGH (ref 11.5–15.5)
WBC: 12.9 10*3/uL — ABNORMAL HIGH (ref 4.0–10.5)
nRBC: 3 % — ABNORMAL HIGH (ref 0.0–0.2)

## 2019-08-19 LAB — COMPREHENSIVE METABOLIC PANEL
ALT: 109 U/L — ABNORMAL HIGH (ref 0–44)
ALT: 95 U/L — ABNORMAL HIGH (ref 0–44)
AST: 130 U/L — ABNORMAL HIGH (ref 15–41)
AST: 102 U/L — ABNORMAL HIGH (ref 15–41)
Albumin: 2 g/dL — ABNORMAL LOW (ref 3.5–5.0)
Albumin: 2 g/dL — ABNORMAL LOW (ref 3.5–5.0)
Alkaline Phosphatase: 259 U/L — ABNORMAL HIGH (ref 38–126)
Alkaline Phosphatase: 273 U/L — ABNORMAL HIGH (ref 38–126)
Anion gap: 15 (ref 5–15)
Anion gap: 12 (ref 5–15)
BUN: 86 mg/dL — ABNORMAL HIGH (ref 8–23)
BUN: 73 mg/dL — ABNORMAL HIGH (ref 8–23)
CO2: 21 mmol/L — ABNORMAL LOW (ref 22–32)
CO2: 26 mmol/L (ref 22–32)
Calcium: 8.5 mg/dL — ABNORMAL LOW (ref 8.9–10.3)
Calcium: 8.2 mg/dL — ABNORMAL LOW (ref 8.9–10.3)
Chloride: 101 mmol/L (ref 98–111)
Chloride: 104 mmol/L (ref 98–111)
Creatinine, Ser: 2.98 mg/dL — ABNORMAL HIGH (ref 0.44–1.00)
Creatinine, Ser: 1.78 mg/dL — ABNORMAL HIGH (ref 0.44–1.00)
GFR calc Af Amer: 17 mL/min — ABNORMAL LOW (ref >60)
GFR calc Af Amer: 32 mL/min — ABNORMAL LOW (ref >60)
GFR calc non Af Amer: 15 mL/min — ABNORMAL LOW (ref >60)
GFR calc non Af Amer: 28 mL/min — ABNORMAL LOW (ref >60)
Glucose, Bld: 102 mg/dL — ABNORMAL HIGH (ref 70–99)
Glucose, Bld: 117 mg/dL — ABNORMAL HIGH (ref 70–99)
Potassium: 5 mmol/L (ref 3.5–5.1)
Potassium: 4.5 mmol/L (ref 3.5–5.1)
Sodium: 137 mmol/L (ref 135–145)
Sodium: 142 mmol/L (ref 135–145)
Total Bilirubin: 1.6 mg/dL — ABNORMAL HIGH (ref 0.3–1.2)
Total Bilirubin: 1.6 mg/dL — ABNORMAL HIGH (ref 0.3–1.2)
Total Protein: 5 g/dL — ABNORMAL LOW (ref 6.5–8.1)
Total Protein: 4.8 g/dL — ABNORMAL LOW (ref 6.5–8.1)

## 2019-08-19 LAB — CBC WITH DIFFERENTIAL/PLATELET
Abs Immature Granulocytes: 0.5 10*3/uL — ABNORMAL HIGH (ref 0.00–0.07)
Basophils Absolute: 0.1 10*3/uL (ref 0.0–0.1)
Basophils Relative: 0 %
Eosinophils Absolute: 0 10*3/uL (ref 0.0–0.5)
Eosinophils Relative: 0 %
HCT: 26.7 % — ABNORMAL LOW (ref 36.0–46.0)
Hemoglobin: 8.6 g/dL — ABNORMAL LOW (ref 12.0–15.0)
Immature Granulocytes: 3 %
Lymphocytes Relative: 8 %
Lymphs Abs: 1.2 10*3/uL (ref 0.7–4.0)
MCH: 32.3 pg (ref 26.0–34.0)
MCHC: 32.2 g/dL (ref 30.0–36.0)
MCV: 100.4 fL — ABNORMAL HIGH (ref 80.0–100.0)
Monocytes Absolute: 1 10*3/uL (ref 0.1–1.0)
Monocytes Relative: 7 %
Neutro Abs: 11.8 10*3/uL — ABNORMAL HIGH (ref 1.7–7.7)
Neutrophils Relative %: 82 %
Platelets: 171 10*3/uL (ref 150–400)
RBC: 2.66 MIL/uL — ABNORMAL LOW (ref 3.87–5.11)
RDW: 18.4 % — ABNORMAL HIGH (ref 11.5–15.5)
WBC: 14.5 10*3/uL — ABNORMAL HIGH (ref 4.0–10.5)
nRBC: 2.7 % — ABNORMAL HIGH (ref 0.0–0.2)

## 2019-08-19 LAB — PHOSPHORUS
Phosphorus: 4.5 mg/dL (ref 2.5–4.6)
Phosphorus: 2.9 mg/dL (ref 2.5–4.6)

## 2019-08-19 LAB — RAPID URINE DRUG SCREEN, HOSP PERFORMED
Amphetamines: NOT DETECTED
Barbiturates: NOT DETECTED
Benzodiazepines: NOT DETECTED
Cocaine: NOT DETECTED
Opiates: POSITIVE — AB
Tetrahydrocannabinol: NOT DETECTED

## 2019-08-19 LAB — BASIC METABOLIC PANEL
Anion gap: 17 — ABNORMAL HIGH (ref 5–15)
BUN: 74 mg/dL — ABNORMAL HIGH (ref 8–23)
CO2: 19 mmol/L — ABNORMAL LOW (ref 22–32)
Calcium: 7.8 mg/dL — ABNORMAL LOW (ref 8.9–10.3)
Chloride: 102 mmol/L (ref 98–111)
Creatinine, Ser: 2.63 mg/dL — ABNORMAL HIGH (ref 0.44–1.00)
GFR calc Af Amer: 20 mL/min — ABNORMAL LOW (ref >60)
GFR calc non Af Amer: 17 mL/min — ABNORMAL LOW (ref >60)
Glucose, Bld: 78 mg/dL (ref 70–99)
Potassium: 4.7 mmol/L (ref 3.5–5.1)
Sodium: 138 mmol/L (ref 135–145)

## 2019-08-19 LAB — SARS CORONAVIRUS 2 (TAT 6-24 HRS): SARS Coronavirus 2: NEGATIVE

## 2019-08-19 LAB — C-REACTIVE PROTEIN: CRP: 24.7 mg/dL — ABNORMAL HIGH (ref <1.0)

## 2019-08-19 LAB — HEPATITIS PANEL, ACUTE
HCV Ab: NONREACTIVE
Hep A IgM: NONREACTIVE
Hep B C IgM: NONREACTIVE
Hepatitis B Surface Ag: NONREACTIVE

## 2019-08-19 LAB — MAGNESIUM
Magnesium: 1.3 mg/dL — ABNORMAL LOW (ref 1.7–2.4)
Magnesium: 1.6 mg/dL — ABNORMAL LOW (ref 1.7–2.4)
Magnesium: 2.6 mg/dL — ABNORMAL HIGH (ref 1.7–2.4)

## 2019-08-19 LAB — PROTIME-INR
INR: 1.1 (ref 0.8–1.2)
Prothrombin Time: 13.9 seconds (ref 11.4–15.2)

## 2019-08-19 LAB — BILIRUBIN, DIRECT: Bilirubin, Direct: 0.8 mg/dL — ABNORMAL HIGH (ref 0.0–0.2)

## 2019-08-19 LAB — GAMMA GT: GGT: 326 U/L — ABNORMAL HIGH (ref 7–50)

## 2019-08-19 LAB — LACTIC ACID, PLASMA
Lactic Acid, Venous: 4.7 mmol/L (ref 0.5–1.9)
Lactic Acid, Venous: 1 mmol/L (ref 0.5–1.9)

## 2019-08-19 LAB — ETHANOL: Alcohol, Ethyl (B): <10 mg/dL (ref <10)

## 2019-08-19 LAB — T4, FREE: Free T4: 0.92 ng/dL (ref 0.61–1.12)

## 2019-08-19 LAB — MRSA PCR SCREENING: MRSA by PCR: POSITIVE — AB

## 2019-08-19 LAB — AMMONIA: Ammonia: 33 umol/L (ref 9–35)

## 2019-08-19 LAB — IRON AND TIBC: Iron: 49 ug/dL (ref 28–170)

## 2019-08-19 LAB — SODIUM, URINE, RANDOM: Sodium, Ur: 26 mmol/L

## 2019-08-19 LAB — TSH: TSH: 8.338 u[IU]/mL — ABNORMAL HIGH (ref 0.350–4.500)

## 2019-08-19 LAB — CREATININE, URINE, RANDOM: Creatinine, Urine: 113.74 mg/dL

## 2019-08-19 LAB — FERRITIN: Ferritin: 1063 ng/mL — ABNORMAL HIGH (ref 11–307)

## 2019-08-19 LAB — PATHOLOGIST SMEAR REVIEW

## 2019-08-19 LAB — FOLATE: Folate: 9.5 ng/mL (ref >5.9)

## 2019-08-19 LAB — SEDIMENTATION RATE: Sed Rate: 25 mm/hr — ABNORMAL HIGH (ref 0–22)

## 2019-08-19 LAB — CK: Total CK: 47 U/L (ref 38–234)

## 2019-08-19 MED ORDER — THIAMINE HCL 100 MG/ML IJ SOLN
100.0000 mg | Freq: Every day | INTRAMUSCULAR | Status: DC
  Administered 2019-08-19: 100 mg via INTRAVENOUS
  Filled 2019-08-19 (×2): qty 2

## 2019-08-19 MED ORDER — SODIUM CHLORIDE 0.9 % IV SOLN: INTRAVENOUS | Status: DC

## 2019-08-19 MED ORDER — OXYCODONE HCL 5 MG PO TABS
5.0000 mg | ORAL_TABLET | Freq: Four times a day (QID) | ORAL | Status: DC | PRN
  Administered 2019-08-19 – 2019-08-24 (×12): 5 mg via ORAL
  Filled 2019-08-19 (×12): qty 1

## 2019-08-19 MED ORDER — MAGNESIUM SULFATE 2 GM/50ML IV SOLN: 2.0000 g | Freq: Once | INTRAVENOUS | Status: DC

## 2019-08-19 MED ORDER — ORAL CARE MOUTH RINSE
15.0000 mL | Freq: Two times a day (BID) | OROMUCOSAL | Status: DC
  Administered 2019-08-19 – 2019-08-24 (×12): 15 mL via OROMUCOSAL

## 2019-08-19 MED ORDER — JUVEN PO PACK
1.0000 | PACK | Freq: Two times a day (BID) | ORAL | Status: DC
  Administered 2019-08-20 – 2019-08-24 (×8): 1 via ORAL
  Filled 2019-08-19 (×10): qty 1

## 2019-08-19 MED ORDER — THIAMINE HCL 100 MG PO TABS
100.0000 mg | ORAL_TABLET | Freq: Every day | ORAL | Status: DC
  Administered 2019-08-20 – 2019-08-24 (×5): 100 mg via ORAL
  Filled 2019-08-19 (×5): qty 1

## 2019-08-19 MED ORDER — LORAZEPAM 1 MG PO TABS: 1.0000 mg | ORAL_TABLET | ORAL | Status: DC | PRN

## 2019-08-19 MED ORDER — PROMETHAZINE HCL 25 MG/ML IJ SOLN: 12.5000 mg | Freq: Four times a day (QID) | INTRAMUSCULAR | Status: DC | PRN

## 2019-08-19 MED ORDER — SODIUM CHLORIDE 0.9 % IV BOLUS
500.0000 mL | Freq: Once | INTRAVENOUS | Status: AC
  Administered 2019-08-19: 11:00:00 500 mL via INTRAVENOUS

## 2019-08-19 MED ORDER — CHLORHEXIDINE GLUCONATE CLOTH 2 % EX PADS
6.0000 | MEDICATED_PAD | Freq: Every day | CUTANEOUS | Status: DC
  Administered 2019-08-19: 11:00:00 6 via TOPICAL

## 2019-08-19 MED ORDER — MAGNESIUM OXIDE 400 (241.3 MG) MG PO TABS
400.0000 mg | ORAL_TABLET | Freq: Every day | ORAL | Status: DC
  Administered 2019-08-19 – 2019-08-21 (×3): 400 mg via ORAL
  Filled 2019-08-19 (×3): qty 1

## 2019-08-19 MED ORDER — LORAZEPAM 2 MG/ML IJ SOLN: 1.0000 mg | INTRAMUSCULAR | Status: DC | PRN

## 2019-08-19 MED ORDER — MAGNESIUM SULFATE 4 GM/100ML IV SOLN
4.0000 g | Freq: Once | INTRAVENOUS | Status: AC
  Administered 2019-08-19: 03:00:00 4 g via INTRAVENOUS
  Filled 2019-08-19: qty 100

## 2019-08-19 MED ORDER — FOLIC ACID 1 MG PO TABS
1.0000 mg | ORAL_TABLET | Freq: Every day | ORAL | Status: DC
  Administered 2019-08-20 – 2019-08-24 (×5): 1 mg via ORAL
  Filled 2019-08-19 (×5): qty 1

## 2019-08-19 MED ORDER — MAGNESIUM OXIDE 400 (241.3 MG) MG PO TABS: 400.0000 mg | ORAL_TABLET | Freq: Two times a day (BID) | ORAL | Status: DC

## 2019-08-19 MED ORDER — NYSTATIN 100000 UNIT/ML MT SUSP
5.0000 mL | Freq: Four times a day (QID) | OROMUCOSAL | Status: DC
  Administered 2019-08-19 – 2019-08-24 (×19): 500000 [IU] via ORAL
  Filled 2019-08-19 (×19): qty 5

## 2019-08-19 MED ORDER — SODIUM CHLORIDE 0.9 % IV BOLUS: 500.0000 mL | Freq: Once | INTRAVENOUS | Status: DC

## 2019-08-19 MED ORDER — SODIUM CHLORIDE 0.9 % IV BOLUS
500.0000 mL | Freq: Once | INTRAVENOUS | Status: AC
  Administered 2019-08-19: 15:00:00 500 mL via INTRAVENOUS

## 2019-08-19 MED ORDER — ADULT MULTIVITAMIN W/MINERALS CH
1.0000 | ORAL_TABLET | Freq: Every day | ORAL | Status: DC
  Administered 2019-08-20 – 2019-08-24 (×5): 1 via ORAL
  Filled 2019-08-19 (×5): qty 1

## 2019-08-19 MED ORDER — ENSURE ENLIVE PO LIQD
237.0000 mL | Freq: Two times a day (BID) | ORAL | Status: DC
  Administered 2019-08-19 – 2019-08-23 (×6): 237 mL via ORAL

## 2019-08-19 NOTE — Consult Note (Signed)
WOC Nurse Consult Note: Reason for Consult: left lateral leg Scattered plaques over the bilateral LE, no significant edema. Large areas of plaques and purple areas over the chest and back.  Unclear etiology. Does not report trauma of any type.   She has been using ginseng violet on her LLE, this explains the blue color of the wounds.  Wound type: Plaques, with liner open wound along the left lateral malleolus, serous drainage, non purulent. Painful for patient, however she reports pain in her entire body.  Pressure Injury POA: Yes; stage 2 on her sacrum per the bedside nurse this am; did not assess today, staff following skin care order set for this wound Measurement: LLE; 0.1cm x 2cm x 0.1cm  Wound bed:see above  Drainage (amount, consistency, odor) see above Periwound: dry scaling skin over entire body.  Dressing procedure/placement/frequency:  Xeroform to the LLE, change daily Silicone foam to the sacrum Low air loss mattress in place for moisture management and pressure redistribution.  Would recommend long term follow up with a dermatologist of her choice if not already established.   Discussed POC with patient and bedside nurse.  Re consult if needed, will not follow at this time. Thanks  Keyani Rigdon [employer], RN,CWOCN, CNS, [place] [phone])

## 2019-08-19 NOTE — Progress Notes (Signed)
PROGRESS NOTE    [name]    Code Status: Full Code  ZYS:[id number] DOB: [birth year] DOA: 08/18/2019 LOS: 1 days  PCP: Christain Sacramento, MD CC:  Chief Complaint  Patient presents with  . Arm Pain       Hospital Summary   This is a 74 year old female with history of chronic hypoxic respiratory failure secondary to COPD on 3 L continuously, hypertension, anxiety, chronic lower extremity wounds follows at wound care, lower extremity venous reflux, chronic generalized skin changes who presented to [facility] on 3/4 for generalized weakness and intractable nausea/vomiting and found to have AKI.  ED Course:  Vital signs in the ED were notable for the following: Tmax 98.0; HR 70-79; BP 96/50 2-1 100/50 RR 13-22; and SPO2 94 to 98% on 4 L nasal cannula.  Labs were notable for the following: CMP notable for the following: Sodium 137, potassium 5.3, chloride 98, bicarbonate 20, anion gap 19, BUN 87, creatinine 3.56 relative to most recent prior creatinine value of 0.44 in December 2019, albumin 2.2, alkaline phosphatase 282, AST 183, ALT 124, total bilirubin 1.8.  Of note, per chart review, it appears that the most recent previous liver enzymes were performed back in December 2019, at which time they were found to be within normal limits.  CBC notable for white blood cell count 15,600 with 77% neutrophils, hemoglobin 9.4 with MCV 103, relative to most recent prior hemoglobin value of 12.9 in December 2019 platelets 195.  Urinalysis was notable for a cloudy specimen with 21-50 white blood cells, many bacteria, moderate leukocyte esterace, positive hyaline casts, and no evidence of squamous epithelial cells.  Blood cultures x2 were collected prior to initiation of any antibiotics.  Nasopharyngeal COVID-19 PCR swab was obtained, with result currently pending.  Chest x-ray showed no evidence of acute cardiopulmonary process, including no evidence of infiltrate, edema, effusion, or pneumothorax.  Renal  ultrasound was ordered and performed in the ED, with result currently pending.  While in the ED, the following were administered: Lactated Ringer bolus x1 L, albuterol inhaler times x 6 puffs.  Subsequently, the patient was admitted to the stepdown unit for further evaluation and management presenting acute renal failure complicated by hyperkalemia as well as for sepsis due to underlying urinary tract infection.  3/5: Renal function initially improved with LR but then worsened, nephrology consulted  A & P   Principal Problem:   ARF (acute renal failure) ([place]) Active Problems:   COPD GOLD III   Chronic respiratory failure with hypoxia (HCC)   Hyperkalemia   Transaminitis   Generalized weakness   Leukocytosis   Anemia   Acute renal failure (ARF) (HCC)   Severe sepsis (HCC)   Acute lower UTI   Pressure injury of skin   1. Generalized weakness likely secondary to hypotension and AKI a. PT/OT b. Lives alone c. LR changed to NS given hyperkalemia 2. AKI likely secondary to hemodynamic changes, poor p.o. intake, intractable nausea/vomiting a. Initially improved then worsened b. UA showing UTI without proteinuria c. Renal ultrasound unremarkable d. Possible underlying undiagnosed rheumatologic disorder with skin changes and acute onset AKI -will check ANA, ESR, CRP e. Nephrology consulted 3. Hypotension of unknown etiology a. Hold antihypertensives b. Slightly improved with 500 cc NS bolus c. Repeat 500 cc NS bolus 4. Chronic hypoxic respiratory failure in setting of COPD on chronic O2 3 L/min nasal cannula a. Follows with Dr. Melvyn Novas outpatient b. SPO2 goal >88% c. Continue home Symbicort and tiotropium and  as needed albuterol inhaler 5. Intractable nausea/vomiting probably from AKI and electrolyte normalities, improved a. IV fluids b. Phenergan for nausea 6. Hyperkalemia a. Initially improved with albuterol then increased to 5.0 b. DC LR and trend 7. Hypomagnesemia a. Replete  via IV 8. Transaminitis likely hepatic steatosis with some biliary sludge a. Acute hepatitis panel unremarkable b. RUQ ultrasound with layering sludge/small stones without evidence of cholecystitis c. Continue to monitor, if she becomes symptomatic we will consider GI consult 9. SIRS VS. Severe sepsis secondary to UTI a. Lactate acid initially elevated at 4, this could be elevated from albuterol treatments which she received several hours prior as well as lactated Ringer's. Lactic acid has resolved b. Afebrile with leukocytosis though she has chronically elevated WBC c. Follow-up urine and blood culture d. Continue ceftriaxone 10. Hypertension a. Holding home antihypertensives 11. Acute on chronic macrocytic anemia, unknown etiology a. Folate level unremarkable b. Recheck iron studies c. Vitamin B12 level d. FOBT 12. Generalized anxiety disorder a. Continue home meds 13. Alcohol use a. EtOH negative b. Reportedly had last drink within the past several days c. CIWA protocol 14. Chronic lower extremity wounds with history of venous reflux a. Wound care 15. Generalized skin thickening of unknown etiology, concern for underlying rheumatic disease (possibly scleroderma?) a. will check ANA, ESR, CRP  DVT prophylaxis: Heparin Family Communication: No family at bedside Disposition Plan:   Patient came from:   Home, lives alone                                                                                          Anticipated d/c place: TBD  Barriers to d/c: Medical stability, will require several more days of inpatient. Will consider Palliative  Care consult depending on clinical status  Pressure injury documentation   Pressure Injury 08/19/19 Sacrum Mid Stage 2 -  Partial thickness loss of dermis presenting as a shallow open injury with a red, pink wound bed without slough. (Active)  08/19/19 0200  Location: Sacrum  Location Orientation: Mid  Staging: Stage 2 -  Partial  thickness loss of dermis presenting as a shallow open injury with a red, pink wound bed without slough.  Wound Description (Comments):   Present on Admission: Yes    Consultants  Nephrology  Procedures  None  Antibiotics   Anti-infectives (From admission, onward)   Start     Dose/Rate Route Frequency Ordered Stop   08/18/19 2315  cefTRIAXone (ROCEPHIN) 1 g in sodium chloride 0.9 % 100 mL IVPB     1 g 200 mL/hr over 30 Minutes Intravenous Every 24 hours 08/18/19 2315          Subjective   Patient is a poor historian and reports complaints of nausea and vomiting with different timelines. Seems as though she has had this issue multiple times in the past. BP at bedside low, nurse at bedside. Unable to obtain an accurate history from the patient however she states that she has not felt well in several months but no specific complaints.  Objective   Vitals:   08/19/19 1000 08/19/19 1100 08/19/19 1200 08/19/19 1500  BP: (!) 88/35  105/67 (!) 120/47 (!) 99/47  Pulse: 76 78 80 79  Resp: 20 (!) _0 Temp:   98.5 F (36.9 C)   TempSrc:      SpO2: 92% 90% 90% 94%  Weight:      Height:        Intake/Output Summary (Last 24 hours) at 08/19/2019 1542 Last data filed at 08/19/2019 1200 Gross per 24 hour  Intake 4154 ml  Output 300 ml  Net 3854 ml   Filed Weights   08/18/19 1822 08/18/19 1834 08/19/19 0200  Weight: 54.4 kg 54.4 kg 51.9 kg    Examination:  Physical Exam Vitals and nursing note reviewed.  Constitutional:      Comments: Chronically ill-appearing  HENT:     Head: Normocephalic.     Mouth/Throat:     Mouth: Mucous membranes are dry.  Eyes:     Conjunctiva/sclera: Conjunctivae normal.  Cardiovascular:     Rate and Rhythm: Normal rate and regular rhythm.     Comments: Hypotensive Pulmonary:     Effort: Pulmonary effort is normal.     Breath sounds: Normal breath sounds.     Comments: 3 L/min nasal cannula Abdominal:     General: Abdomen is flat. There  is no distension.  Musculoskeletal:     Comments: Bilateral lower extremity wounds   Skin:    Coloration: Skin is not jaundiced.     Comments: Generalized none pruritic, nonerythematous, scaly skin thickening most notable on upper extremities and chest Upper back telangiectasias  Neurological:     Mental Status: She is alert.     Data Reviewed: I have personally reviewed following labs and imaging studies  CBC: Recent Labs  Lab 08/18/19 1919 08/18/19 2200 08/19/19 0239  WBC 15.6* 15.4* 14.5*  NEUTROABS 12.1*  --  11.8*  HGB 9.4* 9.1* 8.6*  HCT 29.0* 28.0* 26.7*  MCV 102.8* 101.1* 100.4*  PLT PLATELET CLUMPS NOTED ON SMEAR, COUNT APPEARS DECREASED 195 352   Basic Metabolic Panel: Recent Labs  Lab 08/18/19 1919 08/19/19 0120 08/19/19 0239  NA 137 138 137  K 5.3* 4.7 5.0  CL 98 102 101  CO2 20* 19* 21*  GLUCOSE 87 78 102*  BUN 87* 74* 86*  CREATININE 3.56* 2.63* 2.98*  CALCIUM 8.6* 7.8* 8.5*  MG  --  1.3* 1.6*  PHOS  --   --  4.5   GFR: Estimated Creatinine Clearance: 13.3 mL/min (A) (by C-G formula based on SCr of 2.98 mg/dL (H)). Liver Function Tests: Recent Labs  Lab 08/18/19 1919 08/19/19 0239  AST 183* 130*  ALT 124* 109*  ALKPHOS 282* 259*  BILITOT 1.8* 1.6*  PROT 5.4* 5.0*  ALBUMIN 2.2* 2.0*   No results for input(s): LIPASE, AMYLASE in the last 168 hours. Recent Labs  Lab 08/19/19 0239  AMMONIA 33   Coagulation Profile: Recent Labs  Lab 08/18/19 1919 08/19/19 0239  INR 1.0 1.1   Cardiac Enzymes: Recent Labs  Lab 08/19/19 1117  CKTOTAL 47   BNP (last 3 results) No results for input(s): PROBNP in the last 8760 hours. HbA1C: No results for input(s): HGBA1C in the last 72 hours. CBG: No results for input(s): GLUCAP in the last 168 hours. Lipid Profile: No results for input(s): CHOL, HDL, LDLCALC, TRIG, CHOLHDL, LDLDIRECT in the last 72 hours. Thyroid Function Tests: Recent Labs    08/19/19 0239 08/19/19 0441  TSH 8.338*  --     FREET4  --  0.92   Anemia  Panel: Recent Labs    08/18/19 2325 08/19/19 0120  FOLATE  --  9.5  FERRITIN  --  1,063*  TIBC  --  NOT CALCULATED  IRON  --  49  RETICCTPCT 3.1  --    Sepsis Labs: Recent Labs  Lab 08/19/19 0120 08/19/19 0239  LATICACIDVEN 4.7* 1.0    Recent Results (from the past 240 hour(s))  SARS CORONAVIRUS 2 (TAT 6-24 HRS) Nasopharyngeal Nasopharyngeal Swab     Status: None   Collection Time: 08/18/19  7:56 PM   Specimen: Nasopharyngeal Swab  Result Value Ref Range Status   SARS Coronavirus 2 NEGATIVE NEGATIVE Final    Comment: (NOTE) SARS-CoV-2 target nucleic acids are NOT DETECTED. The SARS-CoV-2 RNA is generally detectable in upper and lower respiratory specimens during the acute phase of infection. Negative results do not preclude SARS-CoV-2 infection, do not rule out co-infections with other pathogens, and should not be used as the sole basis for treatment or other patient management decisions. Negative results must be combined with clinical observations, patient history, and epidemiological information. The expected result is Negative. Fact Sheet for Patients: SugarRoll.be Fact Sheet for Healthcare Providers: https://www.woods-mathews.com/ This test is not yet approved or cleared by the [place] FDA and  has been authorized for detection and/or diagnosis of SARS-CoV-2 by FDA under an Emergency Use Authorization (EUA). This EUA will remain  in effect (meaning this test can be used) for the duration of the COVID-19 declaration under Section 56 4(b)(1) of the Act, 21 U.S.C. section 360bbb-3(b)(1), unless the authorization is terminated or revoked sooner. Performed at [facility], [place] [address], [place], [place] [address]   Culture, blood (routine x 2)     Status: None (Preliminary result)   Collection Time: 08/18/19  9:52 PM   Specimen: BLOOD  Result Value Ref Range Status   Specimen  Description   Final    BLOOD RIGHT ANTECUBITAL Performed at [facility] [address], [place], [place] [address]    Special Requests   Final    BOTTLES DRAWN AEROBIC AND ANAEROBIC Blood Culture adequate volume Performed at [facility] [address], [place], [place] [address]    Culture   Final    NO GROWTH < 12 HOURS Performed at [facility] [address], [place], [place] [address]    Report Status PENDING  Incomplete  Culture, blood (routine x 2)     Status: None (Preliminary result)   Collection Time: 08/18/19 10:00 PM   Specimen: BLOOD  Result Value Ref Range Status   Specimen Description   Final    BLOOD LEFT ANTECUBITAL Performed at [facility] [address], [place], [place] [address]    Special Requests   Final    BOTTLES DRAWN AEROBIC AND ANAEROBIC Blood Culture results may not be optimal due to an inadequate volume of blood received in culture bottles Performed at [facility] [address], [place], [place] [address]    Culture   Final    NO GROWTH < 12 HOURS Performed at [facility] [address], [place], [place] [address]    Report Status PENDING  Incomplete  MRSA PCR Screening     Status: Abnormal   Collection Time: 08/19/19  1:36 AM   Specimen: Nasal Mucosa; Nasopharyngeal  Result Value Ref Range Status   MRSA by PCR POSITIVE (A) NEGATIVE Final    Comment:  The GeneXpert MRSA Assay (FDA approved for NASAL specimens only), is one component of a comprehensive MRSA colonization surveillance program. It is not intended to diagnose MRSA infection nor to guide or monitor treatment for MRSA infections. RESULT CALLED TO, READ BACK BY AND VERIFIED WITH: KOONTEZ, A. @ [phone] 08/19/2019 PERRY, J. Performed at [facility], [place] [address] [place], [place] [address]          Radiology Studies: DG Chest 2 View  Result Date:  08/18/2019 CLINICAL DATA:  Increasing weakness EXAM: CHEST - 2 VIEW COMPARISON:  05/15/2018 FINDINGS: Cardiac shadow is stable. Mild aortic calcifications are seen. The lungs are hyperinflated consistent with COPD. Mild scarring is noted in the bases bilaterally. No focal infiltrate is seen. No sizable effusion is noted. IMPRESSION: COPD. Bibasilar scarring. Electronically Signed   By: Inez Catalina M.D.   On: 08/18/2019 19:49   US Renal  Result Date: 08/18/2019 CLINICAL DATA:  New renal failure. EXAM: RENAL / URINARY TRACT ULTRASOUND COMPLETE COMPARISON:  None. FINDINGS: Right Kidney: Renal measurements: 12.6 x 5.6 x 5.1 cm = volume: 189 mL. Normal echogenicity. No mass or hydronephrosis. Left Kidney: Renal measurements: 10.9 x 5.2 x 6.4 cm = volume: 190 mL. Normal echogenicity. A 9 mm midpole simple cortical cyst and 18 mm upper pole bilobed likely septated cyst. Bladder: Mostly decompressed urinary bladder with bilateral ureteral jet streams and no apparent abnormality. Other: Fatty infiltration of the hepatic parenchyma. None. IMPRESSION: No right or left hydronephrosis. Normal sizes of both kidneys. Small left renal simple and complicated cysts. No apparent abnormality of the mostly decompressed urinary bladder. Fatty infiltration of the hepatic parenchyma. Electronically Signed   By: Revonda Humphrey   On: 08/18/2019 23:10   US Abdomen Limited RUQ  Result Date: 08/19/2019 CLINICAL DATA:  Transaminitis EXAM: ULTRASOUND ABDOMEN LIMITED RIGHT UPPER QUADRANT COMPARISON:  None. FINDINGS: Gallbladder: Layering sludge or small stones are seen layering within the gallbladder. No gallbladder wall thickening. No pericholecystic fluid. No sonographic Murphy sign noted by sonographer. Common bile duct: Diameter: 4.8 mm Liver: Increased echotexture seen throughout. No focal abnormality or biliary ductal dilatation. Portal vein is patent on color Doppler imaging with normal direction of blood flow towards the liver. Other:  None. IMPRESSION: Layering sludge/small stones.  No evidence of cholecystitis. Hepatic steatosis Electronically Signed   By: Prudencio Pair M.D.   On: 08/19/2019 06:04        Scheduled Meds: . buPROPion  150 mg Oral Daily  . Chlorhexidine Gluconate Cloth  6 each Topical Daily  . feeding supplement (ENSURE ENLIVE)  237 mL Oral BID BM  . folic acid  1 mg Oral Daily  . heparin  5,000 Units Subcutaneous Q8H  . magnesium oxide  400 mg Oral Daily  . mouth rinse  15 mL Mouth Rinse BID  . mometasone-formoterol  2 puff Inhalation BID  . multivitamin with minerals  1 tablet Oral Daily  . sodium chloride flush  3 mL Intravenous Q12H  . thiamine  100 mg Oral Daily   Or  . thiamine  100 mg Intravenous Daily  . umeclidinium bromide  1 puff Inhalation q morning - 10a   Continuous Infusions: . cefTRIAXone (ROCEPHIN)  IV 1 g (08/19/19 0019)     Time spent: 35 minutes with over 50% of the time coordinating the patient's care    Harold Hedge, DO [facility] Pager [phone]  Call night coverage person covering after 7pm

## 2019-08-19 NOTE — Progress Notes (Signed)
PT Cancellation Note  Patient Details Name: [name] MRN: [id number] DOB: Mar 07, 1946   Cancelled Treatment:     PT order received but eval deferred at request of RN 2* pt low BP.  Will follow.  [place] Pager [phone] Office [phone]    [facility] 08/19/2019, 10:33 AM

## 2019-08-19 NOTE — Consult Note (Addendum)
[facility] Nephrology Consultation Note  Requesting MD: Dr Neysa Bonito, jared Reason for consult: AKI  HPI:  [name] is a 74 y.o. female with history of COPD, chronic hypoxia on 3 L of oxygen at home, history of alcoholic abuse, HTN, pulmonary hypertension, anxiety, presented to ER on 3/4 with generalized weakness, nausea vomiting and decreased oral intake, seen as a consultation at the request of Dr. Maryln Gottron for acute kidney injury.  The serum creatinine level was 0.44 in 2019 however on admission the creatinine level was elevated to 3.56 associated with potassium 5.3, CO2 20.  She was hypotensive to 90s in ER.  Received IV fluid with improvement in serum creatinine level initially to 2.63 and then the repeat creatinine came back 2.98 today.  The urinalysis with no protein, no RBC however consistent with UTI.  Patient was started on ceftriaxone.  No recent IV contrast.  Patient denies use of NSAIDs.  At home she was on losartan, spironolactone and potassium chloride.  Patient reports not feeling well and having vomiting diarrhea for more than a week at home.  The abdominal ultrasound showed gallbladder stones without evidence of cholecystitis and hepatic steatosis.  The kidney ultrasound with no hydronephrosis however has a small left kidney cyst.  The losartan and spironolactone were held on admission.  Patient denied any known history of kidney disease or seen by nephrologist in the past.  Creatinine, Ser  Date/Time Value Ref Range Status  08/19/2019 02:39 AM 2.98 (H) 0.44 - 1.00 mg/dL Final  08/19/2019 01:20 AM 2.63 (H) 0.44 - 1.00 mg/dL Final  08/18/2019 07:19 PM 3.56 (H) 0.44 - 1.00 mg/dL Final  05/20/2018 05:12 AM 0.44 0.44 - 1.00 mg/dL Final  05/19/2018 04:50 AM 0.40 (L) 0.44 - 1.00 mg/dL Final  05/17/2018 04:46 AM 0.49 0.44 - 1.00 mg/dL Final  05/16/2018 04:18 AM 0.43 (L) 0.44 - 1.00 mg/dL Final  05/15/2018 05:02 AM 0.49 0.44 - 1.00 mg/dL Final  05/14/2018 04:33 AM  0.47 0.44 - 1.00 mg/dL Final  05/13/2018 03:37 AM 0.40 (L) 0.44 - 1.00 mg/dL Final  05/12/2018 07:56 AM 0.45 0.44 - 1.00 mg/dL Final  05/10/2018 03:48 AM 0.37 (L) 0.44 - 1.00 mg/dL Final  05/09/2018 05:57 AM 0.38 (L) 0.44 - 1.00 mg/dL Final  05/08/2018 09:05 PM 0.40 (L) 0.44 - 1.00 mg/dL Final  02/17/2018 02:46 PM 0.57 0.44 - 1.00 mg/dL Final  07/04/2017 12:41 PM 0.54 0.44 - 1.00 mg/dL Final  12/20/2015 04:37 PM 0.67 0.44 - 1.00 mg/dL Final  03/08/2015 12:40 PM 0.69 0.40 - 1.20 mg/dL Final  10/11/2014 01:15 PM 0.56 0.50 - 1.10 mg/dL Final  02/01/2013 07:32 AM 0.5 0.4 - 1.2 mg/dL Final  08/20/2012 03:54 AM 0.42 (L) 0.50 - 1.10 mg/dL Final  08/19/2012 02:55 PM 0.60 0.50 - 1.10 mg/dL Final     PMHx:   Past Medical History:  Diagnosis Date  . Abrasion    under left knee x 2 places changing dressing q day of bid, applying ssd cream area healing due to fell 2 weeks ago  . Blood dyscrasia    BLEEDS EASY  . Bruises easily    bleeds easily  . Closed patellar sleeve fracture of left knee 01/04/2018  . Closed patellar sleeve fracture of right knee 01/04/2018   healing   . COPD (chronic obstructive pulmonary disease) ([place])   . Coronary artery disease    per dr Philis Kendall note 01-01-18 note  . Hernia, inguinal    LEFT  . Hypertension  states under control with meds., has been on med. > 10 yr.  . On home oxygen therapy    at night 2L/min  . Pneumonia    "SEVERAL TIMES IN PAST, LAST TIME 2018"  . Pulmonary hypertension ([place])    per dr Terrence Dupont 7-19 19 lov note  . Shortness of breath dyspnea    with exertion  . SVD (spontaneous vaginal delivery)    x 1  . Thin skin   . Urinary tract infection 2019  . Wears partial dentures    lower partial and upper plate    Past Surgical History:  Procedure Laterality Date  . COLONOSCOPY    . CYSTOCELE REPAIR N/A 03/02/2018   Procedure: ANTERIOR REPAIR (CYSTOCELE);  Surgeon: Cheri Fowler, MD;  Location: [facility];  Service: Gynecology;   Laterality: N/A;  OUTPATIENT IN BED  . MULTIPLE TOOTH EXTRACTIONS    . RESECTION DISTAL CLAVICAL Left 10/12/2014   Procedure: DISTAL CLAVICLE EXCISION;  Surgeon: Kathryne Hitch, MD;  Location: [facility];  Service: Orthopedics;  Laterality: Left;  . SHOULDER ARTHROSCOPY WITH ROTATOR CUFF REPAIR AND SUBACROMIAL DECOMPRESSION Left 10/12/2014   Procedure: LEFT SHOULDER ARTHROSCOPY, DEBRIDEMENT WITH ACROMIOPLASTY,  ROTATOR CUFF REPAIR AND RELEASE BICEPS TENDON;  Surgeon: Kathryne Hitch, MD;  Location: [facility];  Service: Orthopedics;  Laterality: Left;    Family Hx:  Family History  Problem Relation Age of Onset  . Heart disease Father   . Kidney failure Father     Social History:  reports that she quit smoking about 2 years ago. Her smoking use included cigarettes. She has a 57.00 pack-year smoking history. She has never used smokeless tobacco. She reports current alcohol use of about 28.0 standard drinks of alcohol per week. She reports that she does not use drugs.  Allergies:  Allergies  Allergen Reactions  . Tramadol Other (See Comments)    GI UPSET    Medications: Prior to Admission medications   Medication Sig Start Date End Date Taking? Authorizing Provider  albuterol (PROAIR HFA) 108 (90 Base) MCG/ACT inhaler 2 puffs every 4 hours as needed only  if your can't catch your breath Patient taking differently: Inhale 2 puffs into the lungs every 4 (four) hours as needed (if unable to catch breath). 2 puffs every 4 hours as needed only  if your can't catch your breath 03/27/17  Yes Tanda Rockers, MD  albuterol (PROVENTIL) (2.5 MG/3ML) 0.083% nebulizer solution Take 3 mLs (2.5 mg total) by nebulization every 6 (six) hours as needed for wheezing or shortness of breath. 10/08/15  Yes Tanda Rockers, MD  Ascorbic Acid (VITAMIN C WITH ROSE HIPS) 500 MG tablet Take 500 mg by mouth daily.   Yes [provider]  budesonide-formoterol (SYMBICORT) 160-4.5  MCG/ACT inhaler Inhale 2 puffs into the lungs 2 (two) times daily. 07/14/18  Yes Tanda Rockers, MD  buPROPion [facility] SR) 150 MG 12 hr tablet Take 150 mg by mouth daily.  05/26/17  Yes [provider]  cholecalciferol (VITAMIN D) 1000 UNITS tablet Take 1,000 Units by mouth daily.   Yes [provider]  diltiazem (CARDIZEM CD) 360 MG 24 hr capsule Take 360 mg by mouth daily.  12/05/17  Yes [provider]  doxycycline (VIBRA-TABS) 100 MG tablet Take 100 mg by mouth 2 (two) times daily. 08/11/19  Yes [provider]  furosemide (LASIX) 20 MG tablet Take 40 mg by mouth daily as needed for fluid.    Yes [provider]  Garlic 989 MG TABS Take 1 tablet by mouth daily.   Yes [provider]  HYDROcodone-acetaminophen (NORCO) 7.5-325 MG tablet Take 1 tablet by mouth every 4 (four) hours. 08/03/19  Yes [provider]  hydrOXYzine (ATARAX/VISTARIL) 25 MG tablet Take 25 mg by mouth 3 (three) times daily as needed for anxiety. 08/03/19  Yes [provider]  losartan (COZAAR) 100 MG tablet Take 100 mg by mouth daily. 12/10/15  Yes [provider]  Multiple Minerals-Vitamins (CAL MAG ZINC +D3 PO) Take 1 tablet by mouth at bedtime.   Yes [provider]  ondansetron (ZOFRAN) 4 MG tablet Take 2-4 mg by mouth every 6 (six) hours as needed for nausea/vomiting. 07/21/19  Yes [provider]  potassium chloride (K-DUR) 10 MEQ tablet Take 10 mEq by mouth daily as needed (swelling). When takes lasix  10/13/17  Yes [provider]  spironolactone (ALDACTONE) 25 MG tablet Take 1 tablet by mouth 2 (two) times daily. 06/03/18  Yes [provider]  Tiotropium Bromide Monohydrate (SPIRIVA RESPIMAT) 2.5 MCG/ACT AERS INHALE 2 PUFFS INTO THE LUNGS EVERY MORNING Patient taking differently: Inhale 2 puffs into the lungs every morning.  01/12/19  Yes Tanda Rockers, MD  budesonide-formoterol (SYMBICORT) 160-4.5 MCG/ACT  inhaler Inhale 2 puffs into the lungs 2 (two) times daily for 1 day. 02/10/19 02/11/19  Tanda Rockers, MD  HYDROcodone-acetaminophen (NORCO/VICODIN) 5-325 MG tablet Take 1 tablet by mouth every 4 (four) hours as needed. Patient not taking: Reported on 08/18/2019 05/06/18   Tanda Rockers, MD  HYDROcodone-homatropine [facility]) 5-1.5 MG/5ML syrup Take 5 mLs by mouth every 6 (six) hours as needed for cough. Patient not taking: Reported on 08/18/2019 05/20/18   Alma Friendly, MD  OXYGEN Inhale 3 L into the lungs. 24/7 DME- [place]    [provider]  predniSONE (DELTASONE) 10 MG tablet Take 1 tablet (10 mg total) by mouth daily with breakfast. Patient not taking: Reported on 08/18/2019 02/10/19   Tanda Rockers, MD  Respiratory Therapy Supplies (FLUTTER) DEVI Use as directed 02/05/16   Tanda Rockers, MD  Tiotropium Bromide Monohydrate (SPIRIVA RESPIMAT) 2.5 MCG/ACT AERS Inhale 2 puffs into the lungs daily for 1 day. 02/10/19 02/11/19  Tanda Rockers, MD    I have reviewed the patient's current medications.  Labs:  Results for orders placed or performed during the hospital encounter of 08/18/19 (from the past 48 hour(s))  Comprehensive metabolic panel     Status: Abnormal   Collection Time: 08/18/19  7:19 PM  Result Value Ref Range   Sodium 137 135 - 145 mmol/L   Potassium 5.3 (H) 3.5 - 5.1 mmol/L   Chloride 98 98 - 111 mmol/L   CO2 20 (L) 22 - 32 mmol/L   Glucose, Bld 87 70 - 99 mg/dL    Comment: Glucose reference range applies only to samples taken after fasting for at least 8 hours.   BUN 87 (H) 8 - 23 mg/dL   Creatinine, Ser 3.56 (H) 0.44 - 1.00 mg/dL   Calcium 8.6 (L) 8.9 - 10.3 mg/dL   Total Protein 5.4 (L) 6.5 - 8.1 g/dL   Albumin 2.2 (L) 3.5 - 5.0 g/dL   AST 183 (H) 15 - 41 U/L   ALT 124 (H) 0 - 44 U/L   Alkaline Phosphatase 282 (H) 38 - 126 U/L   Total Bilirubin 1.8 (H) 0.3 - 1.2 mg/dL   GFR calc non Af Amer 12 (L) >60  mL/min   GFR calc Af Amer 14 (L) >60 mL/min    Anion gap 19 (H) 5 - 15    Comment: Performed at [facility], [place] [address] [place], [place] [address]  CBC with Differential     Status: Abnormal   Collection Time: 08/18/19  7:19 PM  Result Value Ref Range   WBC 15.6 (H) 4.0 - 10.5 K/uL   RBC 2.82 (L) 3.87 - 5.11 MIL/uL   Hemoglobin 9.4 (L) 12.0 - 15.0 g/dL   HCT 29.0 (L) 36.0 - 46.0 %   MCV 102.8 (H) 80.0 - 100.0 fL   MCH 33.3 26.0 - 34.0 pg   MCHC 32.4 30.0 - 36.0 g/dL   RDW 18.7 (H) 11.5 - 15.5 %   Platelets  150 - 400 K/uL    PLATELET CLUMPS NOTED ON SMEAR, COUNT APPEARS DECREASED    Comment: REPEATED TO VERIFY SPECIMEN CHECKED FOR CLOTS    nRBC 7.9 (H) 0.0 - 0.2 %   Neutrophils Relative % 77 %   Neutro Abs 12.1 (H) 1.7 - 7.7 K/uL   Lymphocytes Relative 9 %   Lymphs Abs 1.3 0.7 - 4.0 K/uL   Monocytes Relative 8 %   Monocytes Absolute 1.3 (H) 0.1 - 1.0 K/uL   Eosinophils Relative 0 %   Eosinophils Absolute 0.0 0.0 - 0.5 K/uL   Basophils Relative 1 %   Basophils Absolute 0.1 0.0 - 0.1 K/uL   WBC Morphology MILD LEFT SHIFT (1-5% METAS, OCC MYELO, OCC BANDS)    Immature Granulocytes 5 %   Abs Immature Granulocytes 0.84 (H) 0.00 - 0.07 K/uL   Target Cells PRESENT     Comment: Performed at [facility], [place] [address] [place], [place] [address]  Sedimentation rate     Status: Abnormal   Collection Time: 08/18/19  7:19 PM  Result Value Ref Range   Sed Rate 27 (H) 0 - 22 mm/hr    Comment: Performed at [facility], [place] [address], [place], [place] [address]  C-reactive protein     Status: Abnormal   Collection Time: 08/18/19  7:19 PM  Result Value Ref Range   CRP 27.5 (H) <1.0 mg/dL    Comment: Performed at [facility], [place] [address], [place], [place] [address]  Lactate dehydrogenase     Status: Abnormal   Collection Time: 08/18/19  7:19 PM  Result Value Ref Range   LDH 491 (H) 98 - 192 U/L    Comment: Performed at [facility], [place] [address], [place], [place] [address]  Protime-INR     Status: None   Collection Time: 08/18/19  7:19 PM  Result Value Ref Range   Prothrombin Time 13.2 11.4 - 15.2 seconds   INR 1.0 0.8 - 1.2    Comment: (NOTE) INR goal varies based on device and disease states. Performed at [facility], [place] [address], [place], [place] [address]   SARS CORONAVIRUS 2 (TAT 6-24 HRS) Nasopharyngeal Nasopharyngeal Swab     Status: None   Collection Time: 08/18/19  7:56 PM   Specimen: Nasopharyngeal Swab  Result Value Ref Range   SARS Coronavirus 2 NEGATIVE NEGATIVE    Comment: (NOTE) SARS-CoV-2 target nucleic acids are NOT DETECTED. The SARS-CoV-2 RNA is generally detectable in upper and lower respiratory specimens during the acute phase of infection. Negative results do not preclude SARS-CoV-2 infection, do not rule out co-infections with other pathogens, and should  not be used as the sole basis for treatment or other patient management decisions. Negative results must be combined with clinical observations, patient history, and epidemiological information. The expected result is Negative. Fact Sheet for Patients: SugarRoll.be Fact Sheet for Healthcare Providers: https://www.woods-mathews.com/ This test is not yet approved or cleared by the [place] FDA and  has been authorized for detection and/or diagnosis of SARS-CoV-2 by FDA under an Emergency Use Authorization (EUA). This EUA will remain  in effect (meaning this test can be used) for the duration of the COVID-19 declaration under Section 56 4(b)(1) of the Act, 21 U.S.C. section 360bbb-3(b)(1), unless the authorization is terminated or revoked sooner. Performed at [facility], [place] [address], [place], [place] [address]   Urinalysis, Routine w reflex microscopic     Status: Abnormal   Collection Time: 08/18/19  9:50 PM  Result Value Ref Range   Color,  Urine YELLOW YELLOW   APPearance CLOUDY (A) CLEAR   Specific Gravity, Urine 1.018 1.005 - 1.030   pH 5.0 5.0 - 8.0   Glucose, UA NEGATIVE NEGATIVE mg/dL   Hgb urine dipstick NEGATIVE NEGATIVE   Bilirubin Urine NEGATIVE NEGATIVE   Ketones, ur NEGATIVE NEGATIVE mg/dL   Protein, ur NEGATIVE NEGATIVE mg/dL   Nitrite NEGATIVE NEGATIVE   Leukocytes,Ua MODERATE (A) NEGATIVE   RBC / HPF 0-5 0 - 5 RBC/hpf   WBC, UA 21-50 0 - 5 WBC/hpf   Bacteria, UA MANY (A) NONE SEEN   Squamous Epithelial / LPF 0-5 0 - 5   Mucus PRESENT    Hyaline Casts, UA PRESENT    Amorphous Crystal PRESENT     Comment: Performed at [facility], [place] [address], [place], [place] [address]  Culture, blood (routine x 2)     Status: None (Preliminary result)   Collection Time: 08/18/19  9:52 PM   Specimen: BLOOD  Result Value Ref Range   Specimen Description      BLOOD RIGHT ANTECUBITAL Performed at [facility], [place] [address], [place], [place] [address]    Special Requests      BOTTLES DRAWN AEROBIC AND ANAEROBIC Blood Culture adequate volume Performed at [place] [address] [place], [place] [address]    Culture      NO GROWTH < 12 HOURS Performed at [place] [address] [place], [place] [address]    Report Status PENDING   Culture, blood (routine x 2)     Status: None (Preliminary result)   Collection Time: 08/18/19 10:00 PM   Specimen: BLOOD  Result Value Ref Range   Specimen Description      BLOOD LEFT ANTECUBITAL Performed at [facility], [place] [address], [place], [place] [address]    Special Requests      BOTTLES DRAWN AEROBIC AND ANAEROBIC Blood Culture results may not be optimal due to an inadequate volume of blood received in culture bottles Performed at [facility], [place] [address], [place], [place] [address]    Culture      NO GROWTH < 12 HOURS Performed at [place] [address], [place], [place] [address]    Report Status PENDING   CBC     Status: Abnormal   Collection Time: 08/18/19 10:00 PM  Result Value Ref Range   WBC 15.4 (H) 4.0 - 10.5 K/uL   RBC 2.77 (L) 3.87 - 5.11 MIL/uL   Hemoglobin 9.1 (L)  12.0 - 15.0 g/dL   HCT 28.0 (L) 36.0 - 46.0 %   MCV 101.1 (H) 80.0 - 100.0 fL   MCH 32.9 26.0 - 34.0 pg   MCHC 32.5 30.0 - 36.0 g/dL   RDW 18.5 (H) 11.5 - 15.5 %   Platelets 195 150 - 400 K/uL   nRBC 3.9 (H) 0.0 - 0.2 %    Comment: Performed at [facility], [place] [address] [place], [place] [address]  Pathologist smear review     Status: None   Collection Time: 08/18/19 11:25 PM  Result Value Ref Range   Path Review Reviewed By Violet Baldy, M.D.     Comment: 08/19/2019 Macrocytic anemia with NRBCs.  Leukocytosis. Performed at [facility], [place] [address], [place], [place] [address]   Reticulocytes     Status: Abnormal   Collection Time: 08/18/19 11:25 PM  Result Value Ref Range   Retic Ct Pct 3.1 0.4 - 3.1 %   RBC. 2.76 (L) 3.87 - 5.11 MIL/uL   Retic Count, Absolute 86.4 19.0 - 186.0 K/uL   Immature Retic Fract 34.6 (H) 2.3 - 15.9 %    Comment: Performed at [facility], [place] [address], [place], [place] [address]  Magnesium     Status: Abnormal   Collection Time: 08/19/19  1:20 AM  Result Value Ref Range   Magnesium 1.3 (L) 1.7 - 2.4 mg/dL    Comment: Performed at [facility], [place] [address] [place], [place] [address]  Lactic acid, plasma     Status: Abnormal   Collection Time: 08/19/19  1:20 AM  Result Value Ref Range   Lactic Acid, Venous 4.7 (HH) 0.5 - 1.9 mmol/L    Comment: CRITICAL RESULT CALLED TO, READ BACK BY AND VERIFIED WITH: HARVEY, RN @ [phone] ON 08/19/19 Sandy Salaam Performed at [facility], [place] [address] [place], [place] [address]   Ferritin     Status: Abnormal   Collection Time: 08/19/19  1:20 AM  Result Value Ref Range    Ferritin 1,063 (H) 11 - 307 ng/mL    Comment: Performed at [facility], [place] [address], [place], [place] [address]  Folate     Status: None   Collection Time: 08/19/19  1:20 AM  Result Value Ref Range   Folate 9.5 >5.9 ng/mL    Comment: Performed at [facility], [place] [address] [place], [place] [address]  Iron and TIBC     Status: None   Collection Time: 08/19/19  1:20 AM  Result Value Ref Range   Iron 49 28 - 170 ug/dL   TIBC NOT CALCULATED 250 - 450 ug/dL   Saturation Ratios NOT CALCULATED 10.4 - 31.8 %   UIBC NOT CALCULATED ug/dL    Comment: Performed at [facility], [place] [address] [place], [place] [address]  Ethanol     Status: None   Collection Time: 08/19/19  1:20 AM  Result Value Ref Range   Alcohol, Ethyl (B) <10 <10 mg/dL    Comment: (NOTE) Lowest detectable limit for serum alcohol is 10 mg/dL. For medical purposes only. Performed at [facility], [place] [address], [place],  [address]   Basic metabolic panel     Status: Abnormal   Collection Time: 08/19/19  1:20 AM  Result Value Ref Range   Sodium 138 135 - 145 mmol/L   Potassium 4.7 3.5 - 5.1 mmol/L  Chloride 102 98 - 111 mmol/L   CO2 19 (L) 22 - 32 mmol/L   Glucose, Bld 78 70 - 99 mg/dL    Comment: Glucose reference range applies only to samples taken after fasting for at least 8 hours.   BUN 74 (H) 8 - 23 mg/dL   Creatinine, Ser 2.63 (H) 0.44 - 1.00 mg/dL   Calcium 7.8 (L) 8.9 - 10.3 mg/dL   GFR calc non Af Amer 17 (L) >60 mL/min   GFR calc Af Amer 20 (L) >60 mL/min   Anion gap 17 (H) 5 - 15    Comment: Performed at [facility], [place] [address], [place], [place] [address]  MRSA PCR Screening     Status: Abnormal   Collection Time: 08/19/19  1:36 AM   Specimen: Nasal Mucosa; Nasopharyngeal  Result Value Ref Range   MRSA by PCR POSITIVE (A) NEGATIVE    Comment:        The GeneXpert MRSA Assay  (FDA approved for NASAL specimens only), is one component of a comprehensive MRSA colonization surveillance program. It is not intended to diagnose MRSA infection nor to guide or monitor treatment for MRSA infections. RESULT CALLED TO, READ BACK BY AND VERIFIED WITH: KOONTEZ, A. @ [phone] 08/19/2019 PERRY, J. Performed at [facility], [place] [address] [place], [place] [address]   Lactic acid, plasma     Status: None   Collection Time: 08/19/19  2:39 AM  Result Value Ref Range   Lactic Acid, Venous 1.0 0.5 - 1.9 mmol/L    Comment: Performed at [facility], [place] [address], [place], [place] [address]  Magnesium     Status: Abnormal   Collection Time: 08/19/19  2:39 AM  Result Value Ref Range   Magnesium 1.6 (L) 1.7 - 2.4 mg/dL    Comment: Performed at [facility], [place] [address], [place], [place] [address]  Comprehensive metabolic panel     Status: Abnormal   Collection Time: 08/19/19  2:39 AM  Result Value Ref Range   Sodium 137 135 - 145 mmol/L   Potassium 5.0 3.5 - 5.1 mmol/L   Chloride 101 98 - 111 mmol/L   CO2 21 (L) 22 - 32 mmol/L   Glucose, Bld 102 (H) 70 - 99 mg/dL    Comment: Glucose reference range applies only to samples taken after fasting for at least 8 hours.   BUN 86 (H) 8 - 23 mg/dL   Creatinine, Ser 2.98 (H) 0.44 - 1.00 mg/dL   Calcium 8.5 (L) 8.9 - 10.3 mg/dL   Total Protein 5.0 (L) 6.5 - 8.1 g/dL   Albumin 2.0 (L) 3.5 - 5.0 g/dL   AST 130 (H) 15 - 41 U/L   ALT 109 (H) 0 - 44 U/L   Alkaline Phosphatase 259 (H) 38 - 126 U/L   Total Bilirubin 1.6 (H) 0.3 - 1.2 mg/dL   GFR calc non Af Amer 15 (L) >60 mL/min   GFR calc Af Amer 17 (L) >60 mL/min   Anion gap 15 5 - 15    Comment: Performed at [facility], [place] [address] [place], [place] [address]  Phosphorus     Status: None   Collection Time: 08/19/19  2:39 AM  Result Value Ref Range   Phosphorus 4.5 2.5 - 4.6 mg/dL    Comment:  Performed at [facility], [place] [address] [place], [place] [address]  CBC WITH DIFFERENTIAL  Status: Abnormal   Collection Time: 08/19/19  2:39 AM  Result Value Ref Range   WBC 14.5 (H) 4.0 - 10.5 K/uL   RBC 2.66 (L) 3.87 - 5.11 MIL/uL   Hemoglobin 8.6 (L) 12.0 - 15.0 g/dL   HCT 26.7 (L) 36.0 - 46.0 %   MCV 100.4 (H) 80.0 - 100.0 fL   MCH 32.3 26.0 - 34.0 pg   MCHC 32.2 30.0 - 36.0 g/dL   RDW 18.4 (H) 11.5 - 15.5 %   Platelets 171 150 - 400 K/uL   nRBC 2.7 (H) 0.0 - 0.2 %   Neutrophils Relative % 82 %   Neutro Abs 11.8 (H) 1.7 - 7.7 K/uL   Lymphocytes Relative 8 %   Lymphs Abs 1.2 0.7 - 4.0 K/uL   Monocytes Relative 7 %   Monocytes Absolute 1.0 0.1 - 1.0 K/uL   Eosinophils Relative 0 %   Eosinophils Absolute 0.0 0.0 - 0.5 K/uL   Basophils Relative 0 %   Basophils Absolute 0.1 0.0 - 0.1 K/uL   Immature Granulocytes 3 %   Abs Immature Granulocytes 0.50 (H) 0.00 - 0.07 K/uL    Comment: Performed at [facility], [place] [address], [place], [place] [address]  Protime-INR     Status: None   Collection Time: 08/19/19  2:39 AM  Result Value Ref Range   Prothrombin Time 13.9 11.4 - 15.2 seconds   INR 1.1 0.8 - 1.2    Comment: (NOTE) INR goal varies based on device and disease states. Performed at [facility], [place] [address] [place], [place] [address]   Ammonia     Status: None   Collection Time: 08/19/19  2:39 AM  Result Value Ref Range   Ammonia 33 9 - 35 umol/L    Comment: Performed at [facility], [place] [address] [place], [place] [address]  Gamma GT     Status: Abnormal   Collection Time: 08/19/19  2:39 AM  Result Value Ref Range   GGT 326 (H) 7 - 50 U/L    Comment: Performed at [facility], [place] [address] [place], [place] [address]  TSH     Status: Abnormal   Collection Time: 08/19/19  2:39 AM  Result Value Ref Range   TSH 8.338 (H) 0.350 - 4.500 uIU/mL    Comment:  Performed by a 3rd Generation assay with a functional sensitivity of <=0.01 uIU/mL. Performed at [facility], [place] [address] [place], [place] [address]   Bilirubin, direct     Status: Abnormal   Collection Time: 08/19/19  2:39 AM  Result Value Ref Range   Bilirubin, Direct 0.8 (H) 0.0 - 0.2 mg/dL    Comment: Performed at [facility], [place] [address], [place], [place] [address]  Hepatitis panel, acute     Status: None   Collection Time: 08/19/19  2:39 AM  Result Value Ref Range   Hepatitis B Surface Ag NON REACTIVE NON REACTIVE   HCV Ab NON REACTIVE NON REACTIVE    Comment: (NOTE) Nonreactive HCV antibody screen is consistent with no HCV infections,  unless recent infection is suspected or other evidence exists to indicate HCV infection.    Hep A IgM NON REACTIVE NON REACTIVE   Hep B C IgM NON REACTIVE NON REACTIVE    Comment: Performed at [facility], [place] [address], [place], [place] [address]  T4, free     Status: None  Collection Time: 08/19/19  4:41 AM  Result Value Ref Range   Free T4 0.92 0.61 - 1.12 ng/dL    Comment: (NOTE) Biotin ingestion may interfere with free T4 tests. If the results are inconsistent with the TSH level, previous test results, or the clinical presentation, then consider biotin interference. If needed, order repeat testing after stopping biotin. Performed at [facility], [place] [address] [place], [place] [address]   Sedimentation rate     Status: Abnormal   Collection Time: 08/19/19 11:17 AM  Result Value Ref Range   Sed Rate 25 (H) 0 - 22 mm/hr    Comment: Performed at [facility], [place] [address] [place], [place] [address]  C-reactive protein     Status: Abnormal   Collection Time: 08/19/19 11:17 AM  Result Value Ref Range   CRP 24.7 (H) <1.0 mg/dL    Comment: Performed at [facility], [place] [address] [place], [place] [address]  CK     Status: None    Collection Time: 08/19/19 11:17 AM  Result Value Ref Range   Total CK 47 38 - 234 U/L    Comment: Performed at [facility], [place] [address], [place], [place] [address]  CBC     Status: Abnormal (Preliminary result)   Collection Time: 08/19/19  3:20 PM  Result Value Ref Range   WBC PENDING 4.0 - 10.5 K/uL   RBC 2.58 (L) 3.87 - 5.11 MIL/uL   Hemoglobin 8.7 (L) 12.0 - 15.0 g/dL   HCT 25.8 (L) 36.0 - 46.0 %   MCV 100.0 80.0 - 100.0 fL   MCH 33.7 26.0 - 34.0 pg   MCHC 33.7 30.0 - 36.0 g/dL   RDW 18.0 (H) 11.5 - 15.5 %   Platelets 164 150 - 400 K/uL    Comment: Performed at [facility], [place] [address] [place], [place] [address]   nRBC PENDING 0.0 - 0.2 %     ROS:  Pertinent items noted in HPI and remainder of comprehensive ROS otherwise negative.  Physical Exam: Vitals:   08/19/19 1500 08/19/19 1600  BP: (!) 99/47   Pulse: 79   Resp: 17   Temp:  98 F (36.7 C)  SpO2: 94%      General exam: Ill-looking female sitting on bed, appears calm and comfortable  Respiratory system: Clear to auscultation. Respiratory effort normal. No wheezing or crackle Cardiovascular system: S1 & S2 heard, RRR.  No pedal edema. Gastrointestinal system: Abdomen is nondistended, soft and nontender. Normal bowel sounds heard. Central nervous system: Alert and oriented. No focal neurological deficits. Extremities: Symmetric 5 x 5 power. Skin: Generalized body nonpruritic scaly thickening skin Psychiatry: Judgement and insight appear normal. Mood & affect appropriate.   Assessment/Plan:  #Acute kidney injury, nonoliguric: Hemodynamically mediated in the setting of vomiting/decreased oral intake, hypotension and concomitant use of losartan and spironolactone.  UA with UTI, no proteinuria or RBC.  Ultrasound kidney ruled out obstruction. -Per RN, patient was incontinent earlier, currently she has external urinary catheter and has around 500 cc of urine in the  collection bag. -Blood pressure is is still soft, agree with continuing IV fluid today.  Please avoid LR because of mild hyperkalemia. -Strict ins and out, avoid nephrotoxins and monitor BMP.  No uremia, no need for dialysis.  #Hypotension: IV fluid as above.  Holding antihypertensive.  #Acute uncomplicated UTI: Currently on ceftriaxone.  Follow-up culture result.  #Hypomagnesemia: Repleted magnesium.  #Nausea  vomiting and generalized weakness: Ultrasound abdomen with cholelithiasis and hepatic steatosis.  Currently on supportive treatment.  Per primary team.  #COPD with chronic hypoxic respiratory failure.  Discussed with ICU nurse.  Thank you for the consult.  We will continue to follow with you.  Lillie Bollig Tanna Furry 08/19/2019, 4:19 PM  [facility].

## 2019-08-19 NOTE — Evaluation (Signed)
Clinical/Bedside Swallow Evaluation Patient Details  Name: [name] MRN: [id number] Date of Birth: [birth year]  Today's Date: 08/19/2019 Time: SLP Start Time (ACUTE ONLY): 47 SLP Stop Time (ACUTE ONLY): 1610 SLP Time Calculation (min) (ACUTE ONLY): 20 min  Past Medical History:  Past Medical History:  Diagnosis Date  . Abrasion    under left knee x 2 places changing dressing q day of bid, applying ssd cream area healing due to fell 2 weeks ago  . Blood dyscrasia    BLEEDS EASY  . Bruises easily    bleeds easily  . Closed patellar sleeve fracture of left knee 01/04/2018  . Closed patellar sleeve fracture of right knee 01/04/2018   healing   . COPD (chronic obstructive pulmonary disease) ([place])   . Coronary artery disease    per dr Philis Kendall note 01-01-18 note  . Hernia, inguinal    LEFT  . Hypertension    states under control with meds., has been on med. > 10 yr.  . On home oxygen therapy    at night 2L/min  . Pneumonia    "SEVERAL TIMES IN PAST, LAST TIME 2018"  . Pulmonary hypertension ([place])    per dr Terrence Dupont 7-19 19 lov note  . Shortness of breath dyspnea    with exertion  . SVD (spontaneous vaginal delivery)    x 1  . Thin skin   . Urinary tract infection 2019  . Wears partial dentures    lower partial and upper plate   Past Surgical History:  Past Surgical History:  Procedure Laterality Date  . COLONOSCOPY    . CYSTOCELE REPAIR N/A 03/02/2018   Procedure: ANTERIOR REPAIR (CYSTOCELE);  Surgeon: Cheri Fowler, MD;  Location: [facility];  Service: Gynecology;  Laterality: N/A;  OUTPATIENT IN BED  . MULTIPLE TOOTH EXTRACTIONS    . RESECTION DISTAL CLAVICAL Left 10/12/2014   Procedure: DISTAL CLAVICLE EXCISION;  Surgeon: Kathryne Hitch, MD;  Location: [facility];  Service: Orthopedics;  Laterality: Left;  . SHOULDER ARTHROSCOPY WITH ROTATOR CUFF REPAIR AND SUBACROMIAL DECOMPRESSION Left 10/12/2014   Procedure: LEFT SHOULDER ARTHROSCOPY, DEBRIDEMENT  WITH ACROMIOPLASTY,  ROTATOR CUFF REPAIR AND RELEASE BICEPS TENDON;  Surgeon: Kathryne Hitch, MD;  Location: [facility];  Service: Orthopedics;  Laterality: Left;   HPI:  74yo female admitted 08/18/19 with generalized weakness x1 month, acute renal failure. PMH: chronic hypoxic respiratory failure, severe COPD on 3L O2, anxiety, PNA (several times, most recent 2018)   Assessment / Plan / Recommendation Clinical Impression  Pt reports significant pain in her mouth and throat. Slight whitish appearance of lingual surface. Unable to visualize throat. Pt may have oropharyngeal thrush, which can be quite painful. Pt does not exhibit overt s/s aspiration on thin or puree textures. Pt declined solids due to pain. RN reports difficulty with larger pill, which pt attributes to pain. Will continue regular diet and thin liquids to allow full range of choices, but encouraged pt and nursing to select soft solids that would not cause increased pain. Rec crush meds in puree to maximize success with medications. Pt is at increased risk for silent aspiration given significant COPD. SLP will follow at bedside to assess diet tolerance and to determine if further work up is warranted. Safe swallow precautions posted at [facility] and reviewed with pt and nursing. SLP Visit Diagnosis: Dysphagia, unspecified (R13.10)    Aspiration Risk  Moderate aspiration risk;Mild aspiration risk    Diet Recommendation Regular;Thin liquid   Medication  Administration: Crushed with puree Supervision: Patient able to self feed;Staff to assist with self feeding; Intermittent supervision/cueing for compensatory strategies  Compensations: Small sips/bites;Slow rate Postural Changes: Seated upright at 90 degrees;Remain upright for at least 30 minutes after po intake    Other  Recommendations Oral Care Recommendations: Oral care BID   Follow up Recommendations (TBD)      Frequency and Duration min 1 x/week  1 week;2 weeks        Prognosis Prognosis for Safe Diet Advancement: Good      Swallow Study   General Date of Onset: 08/18/19 HPI: 74yo female admitted 08/18/19 with generalized weakness x1 month, acute renal failure. PMH: chronic hypoxic respiratory failure, severe COPD on 3L O2, anxiety, PNA (several times, most recent 2018) Type of Study: Bedside Swallow Evaluation Previous Swallow Assessment: none Diet Prior to this Study: Regular;Thin liquids Temperature Spikes Noted: No Respiratory Status: Nasal cannula History of Recent Intubation: No Behavior/Cognition: Alert;Cooperative Oral Cavity Assessment: (slight whiteish coating on lingual surface) Oral Care Completed by SLP: Yes Oral Cavity - Dentition: Missing dentition Patient Positioning: Upright in bed Baseline Vocal Quality: Normal Volitional Cough: Strong Volitional Swallow: Able to elicit    Oral/Motor/Sensory Function Overall Oral Motor/Sensory Function: Within functional limits   Ice Chips Ice chips: Within functional limits Presentation: Spoon   Thin Liquid Thin Liquid: Within functional limits Presentation: Straw    Nectar Thick Nectar Thick Liquid: Not tested   Honey Thick Honey Thick Liquid: Not tested   Puree Puree: Within functional limits Presentation: Spoon   Solid     Solid: Not tested Other Comments: pt declined solids due to oropharyngeal pain     Celia B. Quentin Ore, [facility], [place] Speech Language Pathologist Office: [phone] Pager: [phone]  Shonna Chock 08/19/2019,4:19 PM

## 2019-08-19 NOTE — Progress Notes (Signed)
Initial Nutrition Assessment  DOCUMENTATION CODES:      INTERVENTION:  Continue Ensure Enlive po BID, each supplement provides 350 kcal and 20 grams of protein  Juven BID, each packet provides 95 calories, 2.5 grams of protein (collagen), and 9.8 grams of carbohydrate (3 grams sugar); also contains 7 grams of L-arginine and L-glutamine, 300 mg vitamin C, 15 mg vitamin E, 1.2 mcg vitamin B-12, 9.5 mg zinc, 200 mg calcium, and 1.5 g  Calcium Beta-hydroxy-Beta-methylbutyrate to support wound healing  Magic cup TID with meals, each supplement provides 290 kcal and 9 grams of protein  Dysphagia 3 (mechanical soft) diet  Recommend 500 mg Vit C po BID for 72 hrs, then 250 mg daily   High refeeding risk, recommend monitoring magnesium, potassium, and phosphorus daily for at least 3 days, MD to replete as needed.  NUTRITION DIAGNOSIS:   Severe Malnutrition related to social / environmental circumstances as evidenced by percent weight loss, energy intake < or equal to 50% for > or equal to 1 month, moderate muscle depletion, severe fat depletion.    GOAL:        MONITOR:      REASON FOR ASSESSMENT:   Malnutrition Screening Tool    ASSESSMENT:  74 year old female with past medical history significant of chronic hypoxic respiratory failure in the setting of severe COPD on 3 L continuous supplemental oxygen, CAD, HTN presented with progressive generalized weakness and significant decline in po intake over the past month, nausea with 2 episodes of emesis in the past 2 weeks, and reports a nonpainful, nonpruritic erythematous rash to bilateral upper and lower extremities and followed by outpatient wound care for poorly healing right lower extremity ulcerative lesion.  Patient awake and alert this afternoon at RD visit. She reports that she had just ordered lunch and unsure of what she would be having. Noted home diet as pureed on nutrition screening. She reports not eating much of  anything over the past few months, recalls usually eating broccoli, peas, creamed potatoes and jello. Patient stated that she was trying to clean her bottom partials before coming to the ED and left them at home. Patient amenable to RD suggestion of dysphagia 3 (mechanical soft) diet during admission.   Patient endorses drinking alcohol on a daily basis, stated "I drink whatever I can get my hands on" She denies currently smoking, reports that she quit years ago. Given dietary recall, poor dentition, hx of daily alcohol abuse, BUE; BLE petechia observed on physical assessment, suspect patient likely has vitamin C deficiency. Recommend 500 mg twice daily over the next 72 hrs, and 250 mg daily following.   Mild pitting generalized edema noted per RN assessment.  Current wt 51.9 kg (114.18 lbs) Weight history reviewed, stable over the past 3 months.   Medications reviewed and include: Folic acid, Mag-ox, Rocephin, MVI, Thiamine Rocephin  Labs: BG 102, 78, BUN 86 (H), Cr 2.98 (H), Mg 1.6 (L), CRP 24.7 (H), WBC 14.5 (H)  NUTRITION - FOCUSED PHYSICAL EXAM:    Most Recent Value  Orbital Region  Severe depletion  Upper Arm Region  Unable to assess [soft wrist restraints in place]  Thoracic and Lumbar Region  Unable to assess  Buccal Region  Severe depletion  Temple Region  Moderate depletion  Clavicle Bone Region  Severe depletion  Clavicle and Acromion Bone Region  Unable to assess  Scapular Bone Region  Unable to assess  Dorsal Hand  Moderate depletion  Patellar Region  Unable  to assess  Anterior Thigh Region  Unable to assess  Posterior Calf Region  Unable to assess  Edema (RD Assessment)  Mild  Hair  Reviewed  Eyes  Reviewed  Mouth  Reviewed [poor dentition]  Skin  Reviewed [BUE, BLE petichia]  Nails  Reviewed       Diet Order:   Diet Order            Diet regular Room service appropriate? Yes; Fluid consistency: Thin  Diet effective now              EDUCATION NEEDS:       Skin:  Skin Assessment: Skin Integrity Issues: Skin Integrity Issues:: Stage II, Other (Comment) Stage II: sacrum Other: BUE;BLE cellulitis; R leg; Venous statis ulcer; BUE; wound, pink, painful, slightly weeping;  Last BM:  3/4  Height:   Ht Readings from Last 1 Encounters:  08/19/19 _0  (1.575 m)    Weight:   Wt Readings from Last 1 Encounters:  08/19/19 51.9 kg    BMI:  Body mass index is 20.93 kg/m.  Estimated Nutritional Needs:   Kcal:  1400-1600  Protein:  70-80  Fluid:  >/= 1.4 L/day    Lajuan Lines, RD, LDN Clinical Nutrition After Hours/Weekend Pager # in [place]

## 2019-08-20 ENCOUNTER — Inpatient Hospital Stay (HOSPITAL_COMMUNITY): Payer: Medicare Other

## 2019-08-20 DIAGNOSIS — M79641 Pain in right hand: Secondary | ICD-10-CM

## 2019-08-20 DIAGNOSIS — E43 Unspecified severe protein-calorie malnutrition: Secondary | ICD-10-CM | POA: Insufficient documentation

## 2019-08-20 DIAGNOSIS — J9621 Acute and chronic respiratory failure with hypoxia: Secondary | ICD-10-CM

## 2019-08-20 LAB — BASIC METABOLIC PANEL
Anion gap: 10 (ref 5–15)
BUN: 55 mg/dL — ABNORMAL HIGH (ref 8–23)
CO2: 25 mmol/L (ref 22–32)
Calcium: 8.3 mg/dL — ABNORMAL LOW (ref 8.9–10.3)
Chloride: 108 mmol/L (ref 98–111)
Creatinine, Ser: 1.11 mg/dL — ABNORMAL HIGH (ref 0.44–1.00)
GFR calc Af Amer: 57 mL/min — ABNORMAL LOW (ref >60)
GFR calc non Af Amer: 49 mL/min — ABNORMAL LOW (ref >60)
Glucose, Bld: 124 mg/dL — ABNORMAL HIGH (ref 70–99)
Potassium: 4.2 mmol/L (ref 3.5–5.1)
Sodium: 143 mmol/L (ref 135–145)

## 2019-08-20 LAB — CBC
HCT: 26.6 % — ABNORMAL LOW (ref 36.0–46.0)
Hemoglobin: 8.7 g/dL — ABNORMAL LOW (ref 12.0–15.0)
MCH: 32.8 pg (ref 26.0–34.0)
MCHC: 32.7 g/dL (ref 30.0–36.0)
MCV: 100.4 fL — ABNORMAL HIGH (ref 80.0–100.0)
Platelets: 143 10*3/uL — ABNORMAL LOW (ref 150–400)
RBC: 2.65 MIL/uL — ABNORMAL LOW (ref 3.87–5.11)
RDW: 18.5 % — ABNORMAL HIGH (ref 11.5–15.5)
WBC: 10.7 10*3/uL — ABNORMAL HIGH (ref 4.0–10.5)
nRBC: 2.9 % — ABNORMAL HIGH (ref 0.0–0.2)

## 2019-08-20 LAB — FERRITIN: Ferritin: 1228 ng/mL — ABNORMAL HIGH (ref 11–307)

## 2019-08-20 LAB — MAGNESIUM: Magnesium: 2.2 mg/dL (ref 1.7–2.4)

## 2019-08-20 LAB — IRON AND TIBC: Iron: 38 ug/dL (ref 28–170)

## 2019-08-20 LAB — VITAMIN B12: Vitamin B-12: 1265 pg/mL — ABNORMAL HIGH (ref 180–914)

## 2019-08-20 LAB — URIC ACID: Uric Acid, Serum: 11.6 mg/dL — ABNORMAL HIGH (ref 2.5–7.1)

## 2019-08-20 LAB — ANA: Anti Nuclear Antibody (ANA): NEGATIVE

## 2019-08-20 MED ORDER — PREDNISONE 20 MG PO TABS
40.0000 mg | ORAL_TABLET | Freq: Every day | ORAL | Status: DC
  Administered 2019-08-20: 40 mg via ORAL
  Filled 2019-08-20: qty 2

## 2019-08-20 MED ORDER — MUPIROCIN 2 % EX OINT
1.0000 "application " | TOPICAL_OINTMENT | Freq: Two times a day (BID) | CUTANEOUS | Status: DC
  Administered 2019-08-20 – 2019-08-24 (×9): 1 via NASAL
  Filled 2019-08-20 (×2): qty 22

## 2019-08-20 MED ORDER — PREDNISONE 20 MG PO TABS
20.0000 mg | ORAL_TABLET | Freq: Every day | ORAL | Status: DC
  Administered 2019-08-21: 20 mg via ORAL
  Filled 2019-08-20: qty 1

## 2019-08-20 NOTE — Progress Notes (Signed)
Brief note: Chart and lab result reviewed.  Urine output increased and serum creatinine level improved.  DC IV fluid.  Renal function recovered, sign off. Call back with question.   Katheran James, MD [facility].

## 2019-08-20 NOTE — Evaluation (Signed)
Physical Therapy Evaluation Patient Details Name: [name] MRN: [id number] DOB: 11-02-1945 Today's Date: 08/20/2019   History of Present Illness  Pt admitted with ARF, weakness, sepsis 2* UTI, dehydration, Macrocytic anemia, and Transaminites.  Pt with hx of CAD, COPD, pulmonary htn, L RCR, and ETOH abuse.  Clinical Impression  Pt admitted as above and presenting with functional mobility limitations 2* generalized weakness, generalized "all over" pain, balance deficits, and significant SOB with min exertion.  Pt would benefit from follow up rehab at SNF level to maximize IND and safety prior to dc home with limited assist.    Follow Up Recommendations SNF    Equipment Recommendations  None recommended by PT    Recommendations for Other Services OT consult     Precautions / Restrictions Precautions Precautions: Fall Restrictions Weight Bearing Restrictions: No      Mobility  Bed Mobility Overal bed mobility: Needs Assistance Bed Mobility: Supine to Sit     Supine to sit: Min assist     General bed mobility comments: Increased time with use of bed rail but min physical assist and use of pad to complete transition to EOB sitting  Transfers Overall transfer level: Needs assistance Equipment used: Rolling walker (2 wheeled) Transfers: Sit to/from Stand Sit to Stand: Min assist;Mod assist;+2 safety/equipment;From elevated surface         General transfer comment: cues for safe transition position and use of UEs for safe transition to standing  Ambulation/Gait Ambulation/Gait assistance: Min assist;Mod assist;+2 physical assistance;+2 safety/equipment Gait Distance (Feet): 4 Feet Assistive device: Rolling walker (2 wheeled) Gait Pattern/deviations: Step-to pattern;Decreased step length - right;Decreased step length - left;Shuffle;Trunk flexed Gait velocity: decr   General Gait Details: cues for posture, position from RW and safety awareness.  Pt ltd by SOB with  exertion  Stairs            Wheelchair Mobility    Modified Rankin (Stroke Patients Only)       Balance Overall balance assessment: Needs assistance Sitting-balance support: Feet supported;No upper extremity supported Sitting balance-Leahy Scale: Fair     Standing balance support: Bilateral upper extremity supported Standing balance-Leahy Scale: Poor                               Pertinent Vitals/Pain Pain Assessment: Faces Pain Score: 9  Pain Location: all over Pain Descriptors / Indicators: Grimacing;Guarding;Constant Pain Intervention(s): Limited activity within patient's tolerance;Monitored during session    Home Living Family/patient expects to be discharged to:: Private residence Living Arrangements: Alone   Type of Home: House       Home Layout: One level Home Equipment: Environmental consultant - 2 wheels;Cane - single point;Shower seat - built in;Bedside commode Additional Comments: O2 _0     Prior Function Level of Independence: Independent         Comments: Pt states unable to get up on feet just prior to admit     Hand Dominance        Extremity/Trunk Assessment   Upper Extremity Assessment Upper Extremity Assessment: Generalized weakness    Lower Extremity Assessment Lower Extremity Assessment: Generalized weakness    Cervical / Trunk Assessment Cervical / Trunk Assessment: Kyphotic  Communication   Communication: No difficulties  Cognition Arousal/Alertness: Awake/alert Behavior During Therapy: WFL for tasks assessed/performed Overall Cognitive Status: Within Functional Limits for tasks assessed  General Comments      Exercises     Assessment/Plan    PT Assessment Patient needs continued PT services  PT Problem List Decreased strength;Decreased range of motion;Decreased activity tolerance;Decreased balance;Decreased mobility;Pain;Decreased knowledge of use of DME        PT Treatment Interventions DME instruction;Gait training;Stair training;Functional mobility training;Therapeutic activities;Therapeutic exercise;Balance training;Patient/family education    PT Goals (Current goals can be found in the Care Plan section)  Acute Rehab PT Goals Patient Stated Goal: Regain IND PT Goal Formulation: With patient Time For Goal Achievement: 09/03/19 Potential to Achieve Goals: Good    Frequency Min 3X/week   Barriers to discharge Decreased caregiver support Lives alone    Co-evaluation               AM-PAC PT "6 Clicks" Mobility  Outcome Measure Help needed turning from your back to your side while in a flat bed without using bedrails?: A Little Help needed moving from lying on your back to sitting on the side of a flat bed without using bedrails?: A Little Help needed moving to and from a bed to a chair (including a wheelchair)?: A Lot Help needed standing up from a chair using your arms (e.g., wheelchair or bedside chair)?: A Lot Help needed to walk in hospital room?: A Lot Help needed climbing 3-5 steps with a railing? : A Lot 6 Click Score: 14    End of Session Equipment Utilized During Treatment: Gait belt;Oxygen Activity Tolerance: Patient limited by fatigue;Patient limited by pain Patient left: in chair;with call bell/phone within reach;with chair alarm set Nurse Communication: Mobility status PT Visit Diagnosis: Difficulty in walking, not elsewhere classified (R26.2);Muscle weakness (generalized) (M62.81)    Time: 2111-5520 PT Time Calculation (min) (ACUTE ONLY): 23 min   Charges:   PT Evaluation $PT Eval Low Complexity: 1 Low          [place] Pager [phone] Office [phone]   Pleasant Britz 08/20/2019, 4:52 PM

## 2019-08-20 NOTE — Progress Notes (Signed)
PROGRESS NOTE    [name]    Code Status: Full Code  WNI:[id number] DOB: 03/04/1946 DOA: 08/18/2019 LOS: 2 days  PCP: Christain Sacramento, MD CC:  Chief Complaint  Patient presents with  . Arm Pain       Hospital Summary   This is a 74 year old female with history of chronic hypoxic respiratory failure secondary to COPD on 3 L continuously, hypertension, anxiety, chronic lower extremity wounds follows at wound care, lower extremity venous reflux, chronic generalized skin changes who presented to [facility] on 3/4 for generalized weakness and intractable nausea/vomiting and found to have AKI.  ED Course:  Vital signs in the ED were notable for the following: Tmax 98.0; HR 70-79; BP 96/50 2-1 100/50 RR 13-22; and SPO2 94 to 98% on 4 L nasal cannula.  Labs were notable for the following: CMP notable for the following: Sodium 137, potassium 5.3, chloride 98, bicarbonate 20, anion gap 19, BUN 87, creatinine 3.56 relative to most recent prior creatinine value of 0.44 in December 2019, albumin 2.2, alkaline phosphatase 282, AST 183, ALT 124, total bilirubin 1.8.  Of note, per chart review, it appears that the most recent previous liver enzymes were performed back in December 2019, at which time they were found to be within normal limits.  CBC notable for white blood cell count 15,600 with 77% neutrophils, hemoglobin 9.4 with MCV 103, relative to most recent prior hemoglobin value of 12.9 in December 2019 platelets 195.  Urinalysis was notable for a cloudy specimen with 21-50 white blood cells, many bacteria, moderate leukocyte esterace, positive hyaline casts, and no evidence of squamous epithelial cells.  Blood cultures x2 were collected prior to initiation of any antibiotics.  Nasopharyngeal COVID-19 PCR swab was obtained, with result currently pending.  Chest x-ray showed no evidence of acute cardiopulmonary process, including no evidence of infiltrate, edema, effusion, or pneumothorax.  Renal  ultrasound was ordered and performed in the ED, with result currently pending.  While in the ED, the following were administered: Lactated Ringer bolus x1 L, albuterol inhaler times x 6 puffs.  Subsequently, the patient was admitted to the stepdown unit for further evaluation and management presenting acute renal failure complicated by hyperkalemia as well as for sepsis due to underlying urinary tract infection.  3/5: Renal function initially improved with LR but then worsened, nephrology consulted  3/6: renal function improved. Nephro signed off. Patient transferred to floor   A & P   Principal Problem:   ARF (acute renal failure) (HCC) Active Problems:   COPD GOLD III   Chronic respiratory failure with hypoxia (HCC)   Hyperkalemia   Transaminitis   Generalized weakness   Leukocytosis   Anemia   Acute renal failure (ARF) (HCC)   Severe sepsis (HCC)   Acute lower UTI   Pressure injury of skin   Protein-calorie malnutrition, severe   1. Generalized weakness likely secondary to hypotension and AKI a. PT/OT - Lives alone 2. AKI likely secondary to hemodynamic changes, poor p.o. intake, intractable nausea/vomiting a. Initially improved then worsened wanting renal consult.  LR changed to NS and patient had significant improvement in renal function b. UA showing UTI without proteinuria c. Renal ultrasound unremarkable d. Possible underlying undiagnosed rheumatologic disorder with skin changes and acute onset AKI -will check ANA, ESR, CRP e. Nephrology consulted 3. Hypotension of unknown etiology, resolved with IV fluid boluses and holding antihypertensives 4. Acute on chronic hypoxic respiratory failure in setting of COPD on chronic O2 3  L/min nasal cannula, now concern for COPD exacerbation a. Now on 5 L/min with wheezing b. Follows with Dr. Melvyn Novas outpatient c. SPO2 goal >88% d. Continue home Symbicort and tiotropium and as needed albuterol inhaler e. Add on prednisone 5. Right  hand synovitis a. Concern for underlying rheumatologic issue (ferritin, ESR and CRP elevated, ANA pending) b. Reportedly started 1 month ago with difficulty opening hand c. Check RF d. Check right hand x-ray e. May improve with prednisone 6. Intractable nausea/vomiting probably from AKI and electrolyte normalities, nearly resolved a. IV fluids b. Phenergan for nausea 7. Hyperkalemia a. Initially improved with albuterol then increased to 5.0 b. DC LR and trend 8. Hypomagnesemia a. Replete via IV 9. Transaminitis likely hepatic steatosis with some biliary sludge a. Acute hepatitis panel unremarkable b. RUQ ultrasound with layering sludge/small stones without evidence of cholecystitis c. Continue to monitor, if she becomes symptomatic we will consider GI consult 10. SIRS secondary to UTI a. Lactate acid initially elevated at 4, this could be elevated from albuterol treatments which she received several hours prior as well as lactated Ringer's. Lactic acid has resolved b. Sepsis ruled out Afebrile with leukocytosis (improving) c. Follow-up urine and blood culture d. Continue ceftriaxone for 1 more day 11. Hypertension a. Holding home antihypertensives 12. Acute on chronic macrocytic anemia, unknown etiology a. Folate level unremarkable b. Recheck iron studies c. Vitamin B12 level elevated d. FOBT 13. Generalized anxiety disorder a. Continue home meds 14. Alcohol use a. EtOH negative b. Reportedly had last drink within the past several days c. CIWA protocol 15. Chronic lower extremity wounds with history of venous reflux a. Wound care 16. Generalized skin thickening of unknown etiology, concern for underlying rheumatic disease (possibly scleroderma?) a. Rheum work-up as above  DVT prophylaxis: Heparin Family Communication: No family at bedside Disposition Plan:   Patient came from:   Home, lives alone                                                                                           Anticipated d/c place: TBD  Barriers to d/c: PT eval, medical stability, will require several more days of inpatient. Will consider Palliative  Care consult depending on clinical status.  Transferred to general floor.  Pressure injury documentation   Pressure Injury 08/19/19 Sacrum Mid Stage 2 -  Partial thickness loss of dermis presenting as a shallow open injury with a red, pink wound bed without slough. (Active)  08/19/19 0200  Location: Sacrum  Location Orientation: Mid  Staging: Stage 2 -  Partial thickness loss of dermis presenting as a shallow open injury with a red, pink wound bed without slough.  Wound Description (Comments):   Present on Admission: Yes    Consultants  Nephrology  Procedures  None  Antibiotics   Anti-infectives (From admission, onward)   Start     Dose/Rate Route Frequency Ordered Stop   08/18/19 2315  cefTRIAXone (ROCEPHIN) 1 g in sodium chloride 0.9 % 100 mL IVPB     1 g 200 mL/hr over 30 Minutes Intravenous Every 24 hours 08/18/19 2315  Subjective   States she feels similar to when she presented without much improvement.  Reporting right hand pain, weakness and difficulty with opening her hand.  States this started 1 month ago and is not acute.  Denies any known family history of rheumatologic disorders.  States nausea has improved and vomiting has resolved.  No chest pain or palpitations or fevers.  No other complaints  Objective   Vitals:   08/20/19 0800 08/20/19 0900 08/20/19 0909 08/20/19 1000  BP: 139/78 130/67  122/65  Pulse: 93 89  98  Resp: _0 Temp: 98.4 F (36.9 C)     TempSrc: Oral     SpO2: 96% 100% 96% 92%  Weight:      Height:        Intake/Output Summary (Last 24 hours) at 08/20/2019 1102 Last data filed at 08/20/2019 0900 Gross per 24 hour  Intake 1694.36 ml  Output 1275 ml  Net 419.36 ml   Filed Weights   08/18/19 1834 08/19/19 0200 08/20/19 0500  Weight: 54.4 kg 51.9 kg 51.2 kg     Examination:  Physical Exam Vitals and nursing note reviewed.  Constitutional:      Comments: Chronically ill-appearing  HENT:     Head: Normocephalic.     Mouth/Throat:     Mouth: Mucous membranes are moist.  Eyes:     Conjunctiva/sclera: Conjunctivae normal.  Cardiovascular:     Rate and Rhythm: Normal rate and regular rhythm.     Heart sounds: No murmur.  Pulmonary:     Effort: No respiratory distress.     Breath sounds: Wheezing present.  Abdominal:     General: There is no distension.  Musculoskeletal:     Comments: Chronic lower extremity swelling Right hand digits 2 -4 contracted with erythematous PIPs, swelling and tenderness to light palpation of PIP and DIP (see picture)  Skin:    Comments: Same dry skin  Neurological:     Mental Status: She is alert. Mental status is at baseline.  Psychiatric:        Mood and Affect: Mood normal.       Data Reviewed: I have personally reviewed following labs and imaging studies  CBC: Recent Labs  Lab 08/18/19 1919 08/18/19 2200 08/19/19 0239 08/19/19 1520 08/20/19 0222  WBC 15.6* 15.4* 14.5* 12.9* 10.7*  NEUTROABS 12.1*  --  11.8*  --   --   HGB 9.4* 9.1* 8.6* 8.7* 8.7*  HCT 29.0* 28.0* 26.7* 25.8* 26.6*  MCV 102.8* 101.1* 100.4* 100.0 100.4*  PLT PLATELET CLUMPS NOTED ON SMEAR, COUNT APPEARS DECREASED 195 171 164 742*   Basic Metabolic Panel: Recent Labs  Lab 08/18/19 1919 08/19/19 0120 08/19/19 0239 08/19/19 1520 08/20/19 0222  NA 137 138 137 142 143  K 5.3* 4.7 5.0 4.5 4.2  CL 98 102 101 104 108  CO2 20* 19* 21* 26 25  GLUCOSE 87 78 102* 117* 124*  BUN 87* 74* 86* 73* 55*  CREATININE 3.56* 2.63* 2.98* 1.78* 1.11*  CALCIUM 8.6* 7.8* 8.5* 8.2* 8.3*  MG  --  1.3* 1.6* 2.6* 2.2  PHOS  --   --  4.5 2.9  --    GFR: Estimated Creatinine Clearance: 35.7 mL/min (A) (by C-G formula based on SCr of 1.11 mg/dL (H)). Liver Function Tests: Recent Labs  Lab 08/18/19 1919 08/19/19 0239 08/19/19 1520   AST 183* 130* 102*  ALT 124* 109* 95*  ALKPHOS 282* 259* 273*  BILITOT 1.8* 1.6* 1.6*  PROT 5.4* 5.0* 4.8*  ALBUMIN 2.2* 2.0* 2.0*   No results for input(s): LIPASE, AMYLASE in the last 168 hours. Recent Labs  Lab 08/19/19 0239  AMMONIA 33   Coagulation Profile: Recent Labs  Lab 08/18/19 1919 08/19/19 0239  INR 1.0 1.1   Cardiac Enzymes: Recent Labs  Lab 08/19/19 1117  CKTOTAL 47   BNP (last 3 results) No results for input(s): PROBNP in the last 8760 hours. HbA1C: No results for input(s): HGBA1C in the last 72 hours. CBG: No results for input(s): GLUCAP in the last 168 hours. Lipid Profile: No results for input(s): CHOL, HDL, LDLCALC, TRIG, CHOLHDL, LDLDIRECT in the last 72 hours. Thyroid Function Tests: Recent Labs    08/19/19 0239 08/19/19 0441  TSH 8.338*  --   FREET4  --  0.92   Anemia Panel: Recent Labs    08/18/19 2325 08/19/19 0120 08/19/19 1644  VITAMINB12  --   --  1,265*  FOLATE  --  9.5  --   FERRITIN  --  1,063* 1,228*  TIBC  --  NOT CALCULATED NOT CALCULATED  IRON  --  49 38  RETICCTPCT 3.1  --   --    Sepsis Labs: Recent Labs  Lab 08/19/19 0120 08/19/19 0239  LATICACIDVEN 4.7* 1.0    Recent Results (from the past 240 hour(s))  SARS CORONAVIRUS 2 (TAT 6-24 HRS) Nasopharyngeal Nasopharyngeal Swab     Status: None   Collection Time: 08/18/19  7:56 PM   Specimen: Nasopharyngeal Swab  Result Value Ref Range Status   SARS Coronavirus 2 NEGATIVE NEGATIVE Final    Comment: (NOTE) SARS-CoV-2 target nucleic acids are NOT DETECTED. The SARS-CoV-2 RNA is generally detectable in upper and lower respiratory specimens during the acute phase of infection. Negative results do not preclude SARS-CoV-2 infection, do not rule out co-infections with other pathogens, and should not be used as the sole basis for treatment or other patient management decisions. Negative results must be combined with clinical observations, patient history, and  epidemiological information. The expected result is Negative. Fact Sheet for Patients: SugarRoll.be Fact Sheet for Healthcare Providers: https://www.woods-mathews.com/ This test is not yet approved or cleared by the [place] FDA and  has been authorized for detection and/or diagnosis of SARS-CoV-2 by FDA under an Emergency Use Authorization (EUA). This EUA will remain  in effect (meaning this test can be used) for the duration of the COVID-19 declaration under Section 56 4(b)(1) of the Act, 21 U.S.C. section 360bbb-3(b)(1), unless the authorization is terminated or revoked sooner. Performed at [facility], [place] [address] [place], [place] [address]   Culture, blood (routine x 2)     Status: None (Preliminary result)   Collection Time: 08/18/19  9:52 PM   Specimen: BLOOD  Result Value Ref Range Status   Specimen Description   Final    BLOOD RIGHT ANTECUBITAL Performed at [facility] [address], [place], [place] [address]    Special Requests   Final    BOTTLES DRAWN AEROBIC AND ANAEROBIC Blood Culture adequate volume Performed at [facility] [address], [place], [place] [address]    Culture   Final    NO GROWTH < 12 HOURS Performed at [facility] [address] [place], [place] [address]    Report Status PENDING  Incomplete  Culture, blood (routine x 2)     Status: None (Preliminary result)   Collection Time: 08/18/19 10:00 PM   Specimen:  BLOOD  Result Value Ref Range Status   Specimen Description   Final    BLOOD LEFT ANTECUBITAL Performed at [facility] [address], [place], [place] [address]    Special Requests   Final    BOTTLES DRAWN AEROBIC AND ANAEROBIC Blood Culture results may not be optimal due to an inadequate volume of blood received in culture bottles Performed at [facility] [address], [place],  [place] [address]    Culture   Final    NO GROWTH < 12 HOURS Performed at [facility] [address], [place], [place] [address]    Report Status PENDING  Incomplete  MRSA PCR Screening     Status: Abnormal   Collection Time: 08/19/19  1:36 AM   Specimen: Nasal Mucosa; Nasopharyngeal  Result Value Ref Range Status   MRSA by PCR POSITIVE (A) NEGATIVE Final    Comment:        The GeneXpert MRSA Assay (FDA approved for NASAL specimens only), is one component of a comprehensive MRSA colonization surveillance program. It is not intended to diagnose MRSA infection nor to guide or monitor treatment for MRSA infections. RESULT CALLED TO, READ BACK BY AND VERIFIED WITH: KOONTEZ, A. @ [phone] 08/19/2019 PERRY, J. Performed at [facility], [place] [address], [place],  [address]          Radiology Studies: DG Chest 2 View  Result Date: 08/18/2019 CLINICAL DATA:  Increasing weakness EXAM: CHEST - 2 VIEW COMPARISON:  05/15/2018 FINDINGS: Cardiac shadow is stable. Mild aortic calcifications are seen. The lungs are hyperinflated consistent with COPD. Mild scarring is noted in the bases bilaterally. No focal infiltrate is seen. No sizable effusion is noted. IMPRESSION: COPD. Bibasilar scarring. Electronically Signed   By: Inez Catalina M.D.   On: 08/18/2019 19:49   US Renal  Result Date: 08/18/2019 CLINICAL DATA:  New renal failure. EXAM: RENAL / URINARY TRACT ULTRASOUND COMPLETE COMPARISON:  None. FINDINGS: Right Kidney: Renal measurements: 12.6 x 5.6 x 5.1 cm = volume: 189 mL. Normal echogenicity. No mass or hydronephrosis. Left Kidney: Renal measurements: 10.9 x 5.2 x 6.4 cm = volume: 190 mL. Normal echogenicity. A 9 mm midpole simple cortical cyst and 18 mm upper pole bilobed likely septated cyst. Bladder: Mostly decompressed urinary bladder with bilateral ureteral jet streams and no apparent abnormality. Other: Fatty infiltration of the hepatic parenchyma. None. IMPRESSION:  No right or left hydronephrosis. Normal sizes of both kidneys. Small left renal simple and complicated cysts. No apparent abnormality of the mostly decompressed urinary bladder. Fatty infiltration of the hepatic parenchyma. Electronically Signed   By: Revonda Humphrey   On: 08/18/2019 23:10   US Abdomen Limited RUQ  Result Date: 08/19/2019 CLINICAL DATA:  Transaminitis EXAM: ULTRASOUND ABDOMEN LIMITED RIGHT UPPER QUADRANT COMPARISON:  None. FINDINGS: Gallbladder: Layering sludge or small stones are seen layering within the gallbladder. No gallbladder wall thickening. No pericholecystic fluid. No sonographic Murphy sign noted by sonographer. Common bile duct: Diameter: 4.8 mm Liver: Increased echotexture seen throughout. No focal abnormality or biliary ductal dilatation. Portal vein is patent on color Doppler imaging with normal direction of blood flow towards the liver. Other: None. IMPRESSION: Layering sludge/small stones.  No evidence of cholecystitis. Hepatic steatosis Electronically Signed   By: Prudencio Pair M.D.   On: 08/19/2019 06:04        Scheduled Meds: . buPROPion  150 mg Oral Daily  . Chlorhexidine Gluconate Cloth  6  each Topical Daily  . feeding supplement (ENSURE ENLIVE)  237 mL Oral BID BM  . folic acid  1 mg Oral Daily  . heparin  5,000 Units Subcutaneous Q8H  . magnesium oxide  400 mg Oral Daily  . mouth rinse  15 mL Mouth Rinse BID  . mometasone-formoterol  2 puff Inhalation BID  . multivitamin with minerals  1 tablet Oral Daily  . mupirocin ointment  1 application Nasal BID  . nutrition supplement (JUVEN)  1 packet Oral BID BM  . nystatin  5 mL Oral QID  . sodium chloride flush  3 mL Intravenous Q12H  . thiamine  100 mg Oral Daily   Or  . thiamine  100 mg Intravenous Daily  . umeclidinium bromide  1 puff Inhalation q morning - 10a   Continuous Infusions: . cefTRIAXone (ROCEPHIN)  IV Stopped (08/20/19 0007)     Time spent: 33 minutes with over 50% of the time  coordinating the patient's care    Harold Hedge, DO [facility] Pager [phone]  Call night coverage person covering after 7pm

## 2019-08-21 LAB — URINE CULTURE: Culture: >=100000 — AB

## 2019-08-21 LAB — CBC
HCT: 30.6 % — ABNORMAL LOW (ref 36.0–46.0)
Hemoglobin: 9.6 g/dL — ABNORMAL LOW (ref 12.0–15.0)
MCH: 32.1 pg (ref 26.0–34.0)
MCHC: 31.4 g/dL (ref 30.0–36.0)
MCV: 102.3 fL — ABNORMAL HIGH (ref 80.0–100.0)
Platelets: 153 10*3/uL (ref 150–400)
RBC: 2.99 MIL/uL — ABNORMAL LOW (ref 3.87–5.11)
RDW: 18.7 % — ABNORMAL HIGH (ref 11.5–15.5)
WBC: 9.9 10*3/uL (ref 4.0–10.5)
nRBC: 2 % — ABNORMAL HIGH (ref 0.0–0.2)

## 2019-08-21 LAB — BASIC METABOLIC PANEL
Anion gap: 12 (ref 5–15)
BUN: 41 mg/dL — ABNORMAL HIGH (ref 8–23)
CO2: 26 mmol/L (ref 22–32)
Calcium: 8.1 mg/dL — ABNORMAL LOW (ref 8.9–10.3)
Chloride: 105 mmol/L (ref 98–111)
Creatinine, Ser: 0.71 mg/dL (ref 0.44–1.00)
GFR calc Af Amer: >60 mL/min (ref >60)
GFR calc non Af Amer: >60 mL/min (ref >60)
Glucose, Bld: 169 mg/dL — ABNORMAL HIGH (ref 70–99)
Potassium: 4.4 mmol/L (ref 3.5–5.1)
Sodium: 143 mmol/L (ref 135–145)

## 2019-08-21 LAB — RHEUMATOID FACTOR: Rheumatoid fact SerPl-aCnc: 15.2 IU/mL — ABNORMAL HIGH (ref 0.0–13.9)

## 2019-08-21 MED ORDER — PREDNISONE 20 MG PO TABS
20.0000 mg | ORAL_TABLET | Freq: Once | ORAL | Status: AC
  Administered 2019-08-21: 13:00:00 20 mg via ORAL
  Filled 2019-08-21: qty 1

## 2019-08-21 MED ORDER — PREDNISONE 20 MG PO TABS: 20.0000 mg | ORAL_TABLET | Freq: Every day | ORAL | Status: DC

## 2019-08-21 MED ORDER — PREDNISONE 5 MG PO TABS: 10.0000 mg | ORAL_TABLET | Freq: Every day | ORAL | Status: DC

## 2019-08-21 MED ORDER — PREDNISONE 20 MG PO TABS
40.0000 mg | ORAL_TABLET | Freq: Every day | ORAL | Status: AC
  Administered 2019-08-22: 40 mg via ORAL
  Filled 2019-08-21: qty 2

## 2019-08-21 MED ORDER — PREDNISONE 20 MG PO TABS: 40.0000 mg | ORAL_TABLET | Freq: Every day | ORAL | Status: DC

## 2019-08-21 MED ORDER — PREDNISONE 5 MG PO TABS
30.0000 mg | ORAL_TABLET | Freq: Every day | ORAL | Status: DC
  Administered 2019-08-23 – 2019-08-24 (×2): 30 mg via ORAL
  Filled 2019-08-21 (×2): qty 1

## 2019-08-21 MED ORDER — LOSARTAN POTASSIUM 25 MG PO TABS
25.0000 mg | ORAL_TABLET | Freq: Every day | ORAL | Status: DC
  Administered 2019-08-21 – 2019-08-24 (×4): 25 mg via ORAL
  Filled 2019-08-21 (×4): qty 1

## 2019-08-21 NOTE — Progress Notes (Signed)
PROGRESS NOTE    [name]    Code Status: Full Code  HQP:[id number] DOB: 1945/12/21 DOA: 08/18/2019 LOS: 3 days  PCP: Christain Sacramento, MD CC:  Chief Complaint  Patient presents with  . Arm Pain       Hospital Summary   This is a 74 year old female with history of chronic hypoxic respiratory failure secondary to COPD on 3 L continuously, hypertension, anxiety, chronic lower extremity wounds follows at wound care, lower extremity venous reflux, chronic generalized skin changes who presented to [facility] on 3/4 for generalized weakness and intractable nausea/vomiting and found to have AKI.  ED Course:  Vital signs in the ED were notable for the following: Tmax 98.0; HR 70-79; BP 96/50 2-1 100/50 RR 13-22; and SPO2 94 to 98% on 4 L nasal cannula.  Labs were notable for the following: CMP notable for the following: Sodium 137, potassium 5.3, chloride 98, bicarbonate 20, anion gap 19, BUN 87, creatinine 3.56 relative to most recent prior creatinine value of 0.44 in December 2019, albumin 2.2, alkaline phosphatase 282, AST 183, ALT 124, total bilirubin 1.8.  Of note, per chart review, it appears that the most recent previous liver enzymes were performed back in December 2019, at which time they were found to be within normal limits.  CBC notable for white blood cell count 15,600 with 77% neutrophils, hemoglobin 9.4 with MCV 103, relative to most recent prior hemoglobin value of 12.9 in December 2019 platelets 195.  Urinalysis was notable for a cloudy specimen with 21-50 white blood cells, many bacteria, moderate leukocyte esterace, positive hyaline casts, and no evidence of squamous epithelial cells.  Blood cultures x2 were collected prior to initiation of any antibiotics.  Nasopharyngeal COVID-19 PCR swab was obtained, with result currently pending.  Chest x-ray showed no evidence of acute cardiopulmonary process, including no evidence of infiltrate, edema, effusion, or pneumothorax.  Renal  ultrasound was ordered and performed in the ED, with result currently pending.  While in the ED, the following were administered: Lactated Ringer bolus x1 L, albuterol inhaler times x 6 puffs.  Subsequently, the patient was admitted to the stepdown unit for further evaluation and management presenting acute renal failure complicated by hyperkalemia as well as for sepsis due to underlying urinary tract infection.  3/5: Renal function initially improved with LR but then worsened, nephrology consulted  3/6: renal function improved. Nephro signed off. Patient transferred to floor   A & P   Principal Problem:   ARF (acute renal failure) (HCC) Active Problems:   COPD GOLD III   Chronic respiratory failure with hypoxia (HCC)   Hyperkalemia   Transaminitis   Generalized weakness   Leukocytosis   Anemia   Acute renal failure (ARF) (HCC)   Severe sepsis (HCC)   Acute lower UTI   Pressure injury of skin   Protein-calorie malnutrition, severe   1. Generalized weakness likely secondary to hypotension and AKI a. PT/OT -SNF at DC 2. AKI likely secondary to hemodynamic changes, poor p.o. intake, intractable nausea/vomiting Resolved with IV fluids  3. Hypotension of unknown etiology a. resolved with IV fluid boluses and holding home antihypertensives.   b. Will start losartan at decreased dosing 4. Acute on chronic hypoxic respiratory failure secondary to COPD exacerbation a. On chronic 3 L/min nasal cannula, currently 4 L/min  b. Follows with Dr. Melvyn Novas outpatient c. SPO2 goal >88% d. Continue home Symbicort and tiotropium and as needed albuterol inhaler e. Continue prednisone 5. Right hand synovitis, gout  VS.  Pseudogout VS.  RA a. ESR, CRP, ferritin elevated b. Uric acid: 11.6 c. Right hand x-ray with chondrocalcinosis d. Reportedly started 1 month ago with difficulty opening hand e. Check RF, anti-CCP f. Continue prednisone taper g. Start allopurinol outpatient h. Will restart  losartan at lower dose which can help reduce uric acid 6. Intractable nausea/vomiting probably from AKI and electrolyte normalities resolved a. Phenergan for nausea 7. Hyperkalemia Resolved 8. Hypomagnesemia resolved 9. Transaminitis likely hepatic steatosis with some biliary sludge a. Acute hepatitis panel unremarkable b. RUQ ultrasound with layering sludge/small stones without evidence of cholecystitis c. Continue to monitor, if she becomes symptomatic, consider GI consult otherwise outpatient follow-up 10. SIRS secondary to E. coli UTI SIRS resolved a. Completed ceftriaxone x3 days 11. Hypertension a. Holding home antihypertensives 12. Acute on chronic macrocytic anemia, unknown etiology a. Ferritin high, iron studies not calculated x2 b. Likely anemia of chronic disease c. Hb improved and stable 13. Generalized anxiety disorder a. Continue home meds 14. Alcohol use a. DC CIWA protocol 15. Chronic lower extremity wounds with history of venous reflux a. Wound care  DVT prophylaxis: Heparin Family Communication: No family at bedside Disposition Plan:   Patient came from:   Home, lives alone                                                                                          Anticipated d/c place: SNF  Barriers to d/c: Likely medically stable for DC tomorrow if she continues to improve clinically.  TOC consult for SNF placement.  Pressure injury documentation   Pressure Injury 08/19/19 Sacrum Mid Stage 2 -  Partial thickness loss of dermis presenting as a shallow open injury with a red, pink wound bed without slough. (Active)  08/19/19 0200  Location: Sacrum  Location Orientation: Mid  Staging: Stage 2 -  Partial thickness loss of dermis presenting as a shallow open injury with a red, pink wound bed without slough.  Wound Description (Comments):   Present on Admission: Yes    Consultants  Nephrology  Procedures  None  Antibiotics   Anti-infectives (From  admission, onward)   Start     Dose/Rate Route Frequency Ordered Stop   08/18/19 2315  cefTRIAXone (ROCEPHIN) 1 g in sodium chloride 0.9 % 100 mL IVPB     1 g 200 mL/hr over 30 Minutes Intravenous Every 24 hours 08/18/19 2315          Subjective   Currently states that she continues to feel generalized weakness and denies much improvement since presentation (despite all of her acute issues resolving).  Denies any specific complaints.  No overnight events. Objective   Vitals:   08/21/19 0500 08/21/19 0511 08/21/19 0756 08/21/19 1353  BP:  128/63  107/74  Pulse:  100  81  Resp:    (!) 22  Temp:  97.8 F (36.6 C)  98.1 F (36.7 C)  TempSrc:  Oral  Oral  SpO2:  99% 95% 100%  Weight: 51.4 kg     Height:        Intake/Output Summary (Last 24 hours) at 08/21/2019 1515 Last data filed  at 08/21/2019 1125 Gross per 24 hour  Intake 968.84 ml  Output --  Net 968.84 ml   Filed Weights   08/19/19 0200 08/20/19 0500 08/21/19 0500  Weight: 51.9 kg 51.2 kg 51.4 kg    Examination:  Physical Exam Vitals and nursing note reviewed.  HENT:     Head: Normocephalic.     Mouth/Throat:     Mouth: Mucous membranes are moist.  Eyes:     Conjunctiva/sclera: Conjunctivae normal.  Cardiovascular:     Rate and Rhythm: Normal rate and regular rhythm.  Pulmonary:     Effort: No respiratory distress.     Comments: end expiratory wheezes diffusely Abdominal:     General: Abdomen is flat. There is no distension.  Musculoskeletal:     Comments: Improved erythema of right MCP joints.  Still with tenderness to palpation of right hand MCP, PIP, DIP Poor extension of right fingers  Neurological:     Mental Status: She is alert. Mental status is at baseline.  Psychiatric:        Mood and Affect: Mood normal.        Behavior: Behavior normal.       Data Reviewed: I have personally reviewed following labs and imaging studies  CBC: Recent Labs  Lab 08/18/19 1919 08/18/19 1919  08/18/19 2200 08/19/19 0239 08/19/19 1520 08/20/19 0222 08/21/19 0915  WBC 15.6*   < > 15.4* 14.5* 12.9* 10.7* 9.9  NEUTROABS 12.1*  --   --  11.8*  --   --   --   HGB 9.4*   < > 9.1* 8.6* 8.7* 8.7* 9.6*  HCT 29.0*   < > 28.0* 26.7* 25.8* 26.6* 30.6*  MCV 102.8*   < > 101.1* 100.4* 100.0 100.4* 102.3*  PLT PLATELET CLUMPS NOTED ON SMEAR, COUNT APPEARS DECREASED   < > 195 171 164 143* 153   < > = values in this interval not displayed.   Basic Metabolic Panel: Recent Labs  Lab 08/19/19 0120 08/19/19 0239 08/19/19 1520 08/20/19 0222 08/21/19 0915  NA 138 137 142 143 143  K 4.7 5.0 4.5 4.2 4.4  CL 102 101 104 108 105  CO2 19* 21* _0 GLUCOSE 78 102* 117* 124* 169*  BUN 74* 86* 73* 55* 41*  CREATININE 2.63* 2.98* 1.78* 1.11* 0.71  CALCIUM 7.8* 8.5* 8.2* 8.3* 8.1*  MG 1.3* 1.6* 2.6* 2.2  --   PHOS  --  4.5 2.9  --   --    GFR: Estimated Creatinine Clearance: 49.5 mL/min (by C-G formula based on SCr of 0.71 mg/dL). Liver Function Tests: Recent Labs  Lab 08/18/19 1919 08/19/19 0239 08/19/19 1520  AST 183* 130* 102*  ALT 124* 109* 95*  ALKPHOS 282* 259* 273*  BILITOT 1.8* 1.6* 1.6*  PROT 5.4* 5.0* 4.8*  ALBUMIN 2.2* 2.0* 2.0*   No results for input(s): LIPASE, AMYLASE in the last 168 hours. Recent Labs  Lab 08/19/19 0239  AMMONIA 33   Coagulation Profile: Recent Labs  Lab 08/18/19 1919 08/19/19 0239  INR 1.0 1.1   Cardiac Enzymes: Recent Labs  Lab 08/19/19 1117  CKTOTAL 47   BNP (last 3 results) No results for input(s): PROBNP in the last 8760 hours. HbA1C: No results for input(s): HGBA1C in the last 72 hours. CBG: No results for input(s): GLUCAP in the last 168 hours. Lipid Profile: No results for input(s): CHOL, HDL, LDLCALC, TRIG, CHOLHDL, LDLDIRECT in the last 72 hours. Thyroid Function Tests: Recent Labs  08/19/19 0239 08/19/19 0441  TSH 8.338*  --   FREET4  --  0.92   Anemia Panel: Recent Labs    08/18/19 2325 08/19/19 0120  08/19/19 1644  VITAMINB12  --   --  1,265*  FOLATE  --  9.5  --   FERRITIN  --  1,063* 1,228*  TIBC  --  NOT CALCULATED NOT CALCULATED  IRON  --  49 38  RETICCTPCT 3.1  --   --    Sepsis Labs: Recent Labs  Lab 08/19/19 0120 08/19/19 0239  LATICACIDVEN 4.7* 1.0    Recent Results (from the past 240 hour(s))  SARS CORONAVIRUS 2 (TAT 6-24 HRS) Nasopharyngeal Nasopharyngeal Swab     Status: None   Collection Time: 08/18/19  7:56 PM   Specimen: Nasopharyngeal Swab  Result Value Ref Range Status   SARS Coronavirus 2 NEGATIVE NEGATIVE Final    Comment: (NOTE) SARS-CoV-2 target nucleic acids are NOT DETECTED. The SARS-CoV-2 RNA is generally detectable in upper and lower respiratory specimens during the acute phase of infection. Negative results do not preclude SARS-CoV-2 infection, do not rule out co-infections with other pathogens, and should not be used as the sole basis for treatment or other patient management decisions. Negative results must be combined with clinical observations, patient history, and epidemiological information. The expected result is Negative. Fact Sheet for Patients: SugarRoll.be Fact Sheet for Healthcare Providers: https://www.woods-mathews.com/ This test is not yet approved or cleared by the [place] FDA and  has been authorized for detection and/or diagnosis of SARS-CoV-2 by FDA under an Emergency Use Authorization (EUA). This EUA will remain  in effect (meaning this test can be used) for the duration of the COVID-19 declaration under Section 56 4(b)(1) of the Act, 21 U.S.C. section 360bbb-3(b)(1), unless the authorization is terminated or revoked sooner. Performed at [facility], [place] [address] [place], [place] [address]   Culture, blood (routine x 2)     Status: None (Preliminary result)   Collection Time: 08/18/19  9:52 PM   Specimen: BLOOD  Result Value Ref Range Status   Specimen  Description   Final    BLOOD RIGHT ANTECUBITAL Performed at [place] [address], [place], [place] [address]    Special Requests   Final    BOTTLES DRAWN AEROBIC AND ANAEROBIC Blood Culture adequate volume Performed at [place] [address], [place], [place] [address]    Culture   Final    NO GROWTH 3 DAYS Performed at [facility], [place] [address], [place], [place] [address]    Report Status PENDING  Incomplete  Culture, blood (routine x 2)     Status: None (Preliminary result)   Collection Time: 08/18/19 10:00 PM   Specimen: BLOOD  Result Value Ref Range Status   Specimen Description   Final    BLOOD LEFT ANTECUBITAL Performed at [place] [address], [place], [place] [address]    Special Requests   Final    BOTTLES DRAWN AEROBIC AND ANAEROBIC Blood Culture results may not be optimal due to an inadequate volume of blood received in culture bottles Performed at [place] [address] [place], [place] [address]    Culture   Final    NO GROWTH 3 DAYS Performed at [facility], [place] [address], [place], [place] [address]    Report Status PENDING  Incomplete  MRSA PCR Screening  Status: Abnormal   Collection Time: 08/19/19  1:36 AM   Specimen: Nasal Mucosa; Nasopharyngeal  Result Value Ref Range Status   MRSA by PCR POSITIVE (A) NEGATIVE Final    Comment:        The GeneXpert MRSA Assay (FDA approved for NASAL specimens only), is one component of a comprehensive MRSA colonization surveillance program. It is not intended to diagnose MRSA infection nor to guide or monitor treatment for MRSA infections. RESULT CALLED TO, READ BACK BY AND VERIFIED WITH: KOONTEZ, A. @ [phone] 08/19/2019 PERRY, J. Performed at [facility], [place] [address], [place], [place] [address]   Culture, Urine     Status: Abnormal   Collection Time: 08/19/19  5:00 PM    Specimen: Urine, Random  Result Value Ref Range Status   Specimen Description   Final    URINE, RANDOM Performed at [place] [address], [place], [place] [address]    Special Requests   Final    NONE Performed at [facility], [place] [address], [place], [place] [address]    Culture >=100,000 COLONIES/mL ESCHERICHIA COLI (A)  Final   Report Status 08/21/2019 FINAL  Final   Organism ID, Bacteria ESCHERICHIA COLI (A)  Final      Susceptibility   Escherichia coli - MIC*    AMPICILLIN >=32 RESISTANT Resistant     CEFAZOLIN 8 SENSITIVE Sensitive     CEFTRIAXONE <=0.25 SENSITIVE Sensitive     CIPROFLOXACIN <=0.25 SENSITIVE Sensitive     GENTAMICIN <=1 SENSITIVE Sensitive     IMIPENEM <=0.25 SENSITIVE Sensitive     NITROFURANTOIN <=16 SENSITIVE Sensitive     TRIMETH/SULFA <=20 SENSITIVE Sensitive     AMPICILLIN/SULBACTAM >=32 RESISTANT Resistant     PIP/TAZO <=4 SENSITIVE Sensitive     * >=100,000 COLONIES/mL ESCHERICHIA COLI         Radiology Studies: DG Hand 2 View Right  Result Date: 08/20/2019 CLINICAL DATA:  Swelling in the region of the 2nd through 5th MCP joints of the right hand for the past 2 weeks. No known injury. EXAM: RIGHT HAND - 2 VIEW COMPARISON:  None. FINDINGS: Dorsal soft tissue swelling at the level of the MCP joints. No associated bony abnormalities. Triangular fibrocartilage calcification. IMPRESSION: 1. Dorsal soft tissue swelling without underlying bony abnormality. 2. Chondrocalcinosis. Electronically Signed   By: Claudie Revering M.D.   On: 08/20/2019 11:20        Scheduled Meds: . buPROPion  150 mg Oral Daily  . feeding supplement (ENSURE ENLIVE)  237 mL Oral BID BM  . folic acid  1 mg Oral Daily  . heparin  5,000 Units Subcutaneous Q8H  . magnesium oxide  400 mg Oral Daily  . mouth rinse  15 mL Mouth Rinse BID  . mometasone-formoterol  2 puff Inhalation BID  . multivitamin with minerals  1 tablet Oral Daily   . mupirocin ointment  1 application Nasal BID  . nutrition supplement (JUVEN)  1 packet Oral BID BM  . nystatin  5 mL Oral QID  . [START ON 08/22/2019] predniSONE  40 mg Oral Q breakfast   Followed by  . [START ON 08/23/2019] predniSONE  30 mg Oral Q breakfast   Followed by  . [START ON 08/26/2019] predniSONE  20 mg Oral Q breakfast   Followed by  . [START ON 08/29/2019] predniSONE  10 mg Oral Q breakfast  . sodium chloride flush  3 mL Intravenous Q12H  . thiamine  100 mg Oral Daily   Or  . thiamine  100 mg Intravenous Daily  . umeclidinium bromide  1 puff Inhalation q morning - 10a   Continuous Infusions: . cefTRIAXone (ROCEPHIN)  IV 1 g (08/20/19 2302)     Time spent: 25 minutes with over 50% of the time coordinating the patient's care    Harold Hedge, DO [facility] Pager [phone]  Call night coverage person covering after 7pm

## 2019-08-22 DIAGNOSIS — M1990 Unspecified osteoarthritis, unspecified site: Secondary | ICD-10-CM

## 2019-08-22 LAB — CBC
HCT: 25.6 % — ABNORMAL LOW (ref 36.0–46.0)
Hemoglobin: 8.2 g/dL — ABNORMAL LOW (ref 12.0–15.0)
MCH: 32.9 pg (ref 26.0–34.0)
MCHC: 32 g/dL (ref 30.0–36.0)
MCV: 102.8 fL — ABNORMAL HIGH (ref 80.0–100.0)
Platelets: 145 10*3/uL — ABNORMAL LOW (ref 150–400)
RBC: 2.49 MIL/uL — ABNORMAL LOW (ref 3.87–5.11)
RDW: 18.8 % — ABNORMAL HIGH (ref 11.5–15.5)
WBC: 10.2 10*3/uL (ref 4.0–10.5)
nRBC: 0.7 % — ABNORMAL HIGH (ref 0.0–0.2)

## 2019-08-22 LAB — BASIC METABOLIC PANEL
Anion gap: 7 (ref 5–15)
BUN: 36 mg/dL — ABNORMAL HIGH (ref 8–23)
CO2: 28 mmol/L (ref 22–32)
Calcium: 7.8 mg/dL — ABNORMAL LOW (ref 8.9–10.3)
Chloride: 106 mmol/L (ref 98–111)
Creatinine, Ser: 0.64 mg/dL (ref 0.44–1.00)
GFR calc Af Amer: >60 mL/min (ref >60)
GFR calc non Af Amer: >60 mL/min (ref >60)
Glucose, Bld: 139 mg/dL — ABNORMAL HIGH (ref 70–99)
Potassium: 4.2 mmol/L (ref 3.5–5.1)
Sodium: 141 mmol/L (ref 135–145)

## 2019-08-22 LAB — MAGNESIUM: Magnesium: 1.6 mg/dL — ABNORMAL LOW (ref 1.7–2.4)

## 2019-08-22 MED ORDER — DILTIAZEM HCL ER COATED BEADS 240 MG PO CP24
240.0000 mg | ORAL_CAPSULE | Freq: Every day | ORAL | Status: DC
  Administered 2019-08-22 – 2019-08-24 (×3): 240 mg via ORAL
  Filled 2019-08-22 (×3): qty 1

## 2019-08-22 MED ORDER — DILTIAZEM HCL ER COATED BEADS 180 MG PO CP24: 360.0000 mg | ORAL_CAPSULE | Freq: Every day | ORAL | Status: DC

## 2019-08-22 MED ORDER — SODIUM CHLORIDE 0.9 % IV SOLN
INTRAVENOUS | Status: DC | PRN
  Administered 2019-08-22: 10:00:00 250 mL via INTRAVENOUS

## 2019-08-22 MED ORDER — MAGNESIUM SULFATE 2 GM/50ML IV SOLN
2.0000 g | Freq: Once | INTRAVENOUS | Status: AC
  Administered 2019-08-22: 10:00:00 2 g via INTRAVENOUS
  Filled 2019-08-22: qty 50

## 2019-08-22 MED ORDER — TRAZODONE HCL 50 MG PO TABS
25.0000 mg | ORAL_TABLET | Freq: Every evening | ORAL | Status: DC | PRN
  Administered 2019-08-23: 22:00:00 25 mg via ORAL
  Filled 2019-08-22 (×2): qty 1

## 2019-08-22 MED ORDER — FUROSEMIDE 20 MG PO TABS
20.0000 mg | ORAL_TABLET | Freq: Every day | ORAL | Status: DC
  Administered 2019-08-22 – 2019-08-24 (×3): 20 mg via ORAL
  Filled 2019-08-22 (×3): qty 1

## 2019-08-22 MED ORDER — FUROSEMIDE 40 MG PO TABS: 40.0000 mg | ORAL_TABLET | Freq: Every day | ORAL | Status: DC | PRN

## 2019-08-22 NOTE — Progress Notes (Signed)
SLP Cancellation Note  Patient Details Name: [name] MRN: [id number] DOB: [birth year]   Cancelled treatment:       Reason Eval/Treat Not Completed: Other (comment)(pt getting meds from rn and to be cleaned up, will continue efforts)  Kathleen Lime, MS [facility] SLP Acute Rehab Services Office [phone]  Macario Golds 08/22/2019, 10:05 AM

## 2019-08-22 NOTE — NC FL2 (Signed)
[place] LEVEL OF CARE SCREENING TOOL     IDENTIFICATION  Patient Name: [name] Birthdate: 01-31-1946 Sex: female Admission Date (Current Location): 08/18/2019  [facility] and [place] Number:  Herbalist and Address:  [facility],  [place], [place]      Provider Number: [id number]  Attending Physician Name and Address:  Harold Hedge, MD  Relative Name and Phone Number:  Elige Radon [phone]    Current Level of Care: Hospital Recommended Level of Care: [place] Prior Approval Number:    Date Approved/Denied:   PASRR Number: [id number]  Discharge Plan: SNF    Current Diagnoses: Patient Active Problem List   Diagnosis Date Noted  . 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 Protein-calorie malnutrition, severe 08/20/2019  . Severe sepsis ([place]) 08/19/2019  . Acute lower UTI 08/19/2019  . Pressure injury of skin 08/19/2019  . ARF (acute renal failure) ([place]) 08/18/2019  . Hyperkalemia 08/18/2019  . Transaminitis 08/18/2019  . Generalized weakness 08/18/2019  . Leukocytosis 08/18/2019  . Anemia 08/18/2019  . Acute renal failure (ARF) ([place]) 08/18/2019  . Chronic respiratory failure with hypoxia and hypercapnia ([place]) 06/02/2018  . COPD with acute exacerbation ([place]) 05/09/2018  . Hyponatremia 05/09/2018  . Acute urinary retention 05/09/2018  . Cystocele with prolapse 03/02/2018  . Cor pulmonale, chronic ([place]) 06/29/2017  . Chronic respiratory failure with hypoxia ([place]) 06/30/2014  . Pulmonary infiltrates 01/26/2013  . Nodule of right lung 08/19/2012  . Cancer screening 05/12/2012  . COPD exacerbation ([place]) 05/12/2012  . Essential hypertension 03/28/2012  . COPD GOLD III 12/02/2011  . Chronic cough 12/02/2011  . Cigarette smoker 12/02/2011    Orientation RESPIRATION BLADDER Height & Weight     Self, Time, Situation, Place  O2(02 4l [place] continuous) Continent Weight: 51.4 kg Height:  5'  2" (157.5 cm)  BEHAVIORAL SYMPTOMS/MOOD NEUROLOGICAL BOWEL NUTRITION STATUS      Continent Diet(Dysphagia 2 mechanical soft)  AMBULATORY STATUS COMMUNICATION OF NEEDS Skin   Limited Assist Verbally PU Stage and Appropriate Care PU Stage 1 Dressing: BID                     Personal Care Assistance Level of Assistance  Bathing, Feeding, Dressing Bathing Assistance: Limited assistance Feeding assistance: Limited assistance Dressing Assistance: Limited assistance     Functional Limitations Info  Sight, Hearing, Speech Sight Info: Adequate Hearing Info: Adequate Speech Info: Adequate    SPECIAL CARE FACTORS FREQUENCY  PT (By licensed PT), OT (By licensed OT)     PT Frequency: 5x week OT Frequency: 5x week            Contractures Contractures Info: Not present    Additional Factors Info  Code Status, Allergies Code Status Info: full code Allergies Info: tramadol           Current Medications (08/22/2019):  This is the current hospital active medication list Current Facility-Administered Medications  Medication Dose Route Frequency Provider Last Rate Last Admin  . 0.9 %  sodium chloride infusion   Intravenous PRN Vance Gather B, MD 10 mL/hr at 08/22/19 0930 250 mL at 08/22/19 0930  . acetaminophen (TYLENOL) tablet 650 mg  650 mg Oral Q6H PRN Howerter, Justin B, DO   650 mg at 08/21/19 1647   Or  . acetaminophen (TYLENOL) suppository 650 mg  650 mg Rectal Q6H PRN Howerter, Justin B, DO      . albuterol (PROVENTIL) (  2.5 MG/3ML) 0.083% nebulizer solution 3 mL  3 mL Inhalation Q4H PRN Howerter, Justin B, DO      . buPROPion (WELLBUTRIN SR) 12 hr tablet 150 mg  150 mg Oral Daily Howerter, Justin B, DO   150 mg at 08/22/19 0933  . diltiazem (CARDIZEM CD) 24 hr capsule 240 mg  240 mg Oral Daily Harold Hedge, MD   240 mg at 08/22/19 0933  . feeding supplement (ENSURE ENLIVE) (ENSURE ENLIVE) liquid 237 mL  237 mL Oral BID BM Howerter, Justin B, DO   237 mL at 08/22/19 0934  .  folic acid (FOLVITE) tablet 1 mg  1 mg Oral Daily Harold Hedge, MD   1 mg at 08/22/19 2130  . furosemide (LASIX) tablet 20 mg  20 mg Oral Daily Harold Hedge, MD   20 mg at 08/22/19 0934  . heparin injection 5,000 Units  5,000 Units Subcutaneous Q8H Howerter, Justin B, DO   5,000 Units at 08/22/19 1441  . hydrOXYzine (ATARAX/VISTARIL) tablet 25 mg  25 mg Oral TID PRN Howerter, Justin B, DO      . losartan (COZAAR) tablet 25 mg  25 mg Oral Daily Harold Hedge, MD   25 mg at 08/22/19 0933  . MEDLINE mouth rinse  15 mL Mouth Rinse BID Howerter, Justin B, DO   15 mL at 08/22/19 0935  . mometasone-formoterol (DULERA) 200-5 MCG/ACT inhaler 2 puff  2 puff Inhalation BID Howerter, Justin B, DO   2 puff at 08/22/19 0904  . multivitamin with minerals tablet 1 tablet  1 tablet Oral Daily Harold Hedge, MD   1 tablet at 08/22/19 0934  . mupirocin ointment (BACTROBAN) 2 % 1 application  1 application Nasal BID Harold Hedge, MD   1 application at 86/57/84 0935  . nutrition supplement (JUVEN) (JUVEN) powder packet 1 packet  1 packet Oral BID BM Harold Hedge, MD   1 packet at 08/22/19 1439  . nystatin (MYCOSTATIN) 100000 UNIT/ML suspension 500,000 Units  5 mL Oral QID Harold Hedge, MD   500,000 Units at 08/22/19 1438  . ondansetron (ZOFRAN) injection 4 mg  4 mg Intravenous Q6H PRN Howerter, Justin B, DO   4 mg at 08/20/19 0524  . oxyCODONE (Oxy IR/ROXICODONE) immediate release tablet 5 mg  5 mg Oral Q6H PRN Sharion Settler, NP   5 mg at 08/22/19 0901  . [START ON 08/23/2019] predniSONE (DELTASONE) tablet 30 mg  30 mg Oral Q breakfast Lenis Noon, [facility]       Followed by  . [START ON 08/26/2019] predniSONE (DELTASONE) tablet 20 mg  20 mg Oral Q breakfast Lenis Noon, [facility]       Followed by  . [START ON 08/29/2019] predniSONE (DELTASONE) tablet 10 mg  10 mg Oral Q breakfast Lenis Noon, [facility]      . promethazine (PHENERGAN) injection 12.5 mg  12.5 mg Intravenous Q6H PRN Howerter, Justin B, DO      .  sodium chloride flush (NS) 0.9 % injection 3 mL  3 mL Intravenous Q12H Howerter, Justin B, DO   3 mL at 08/21/19 2224  . thiamine tablet 100 mg  100 mg Oral Daily Harold Hedge, MD   100 mg at 08/22/19 6962   Or  . thiamine (B-1) injection 100 mg  100 mg Intravenous Daily Harold Hedge, MD   100 mg at 08/19/19 1625  . umeclidinium bromide (INCRUSE ELLIPTA) 62.5 MCG/INH 1 puff  1 puff Inhalation q morning - 10a Howerter, Justin B, DO   1 puff at 08/22/19 0908     Discharge Medications: Please see discharge summary for a list of discharge medications.  Relevant Imaging Results:  Relevant Lab Results:   Additional Information ss#[id number]  Rees Matura, Juliann Pulse, RN

## 2019-08-22 NOTE — Consult Note (Signed)
ORTHOPAEDIC CONSULTATION HISTORY & PHYSICAL REQUESTING PHYSICIAN: Harold Hedge, MD  Chief Complaint: right hand pain and swelling  HPI: [name] is a 74 y.o. female who was admitted with acute renal insufficiency, in the setting of chronic hypoxic respiratory failure secondary to COPD.  She has a history of chronic lower extremity wounds and lower extremity venous reflux, and has reported of 1 month history of pain and swelling in the right hand, making range of motion more painful.  She has been noted to have inflammatory swelling in the right hand, with multiple small-joint synovitis, elevated uric acid and inflammatory parameters, with WBC 10.2.  Radiographs reveal chondrocalcinosis.  Hand surgery was consulted regarding diminished range of motion of the right hand.  I discussed this history with her and confirmed it.  She has already removed a couple rings from the hand, but 1 remains on the long finger.  She also reports a history of her left hand having been swollen and painful like this previously, with ultimate spontaneous resolution  Past Medical History:  Diagnosis Date  . Abrasion    under left knee x 2 places changing dressing q day of bid, applying ssd cream area healing due to fell 2 weeks ago  . Blood dyscrasia    BLEEDS EASY  . Bruises easily    bleeds easily  . Closed patellar sleeve fracture of left knee 01/04/2018  . Closed patellar sleeve fracture of right knee 01/04/2018   healing   . COPD (chronic obstructive pulmonary disease) ([place])   . Coronary artery disease    per dr Philis Kendall note 01-01-18 note  . Hernia, inguinal    LEFT  . Hypertension    states under control with meds., has been on med. > 10 yr.  . On home oxygen therapy    at night 2L/min  . Pneumonia    "SEVERAL TIMES IN PAST, LAST TIME 2018"  . Pulmonary hypertension ([place])    per dr Terrence Dupont 7-19 19 lov note  . Shortness of breath dyspnea    with exertion  . SVD (spontaneous vaginal  delivery)    x 1  . Thin skin   . Urinary tract infection 2019  . Wears partial dentures    lower partial and upper plate   Past Surgical History:  Procedure Laterality Date  . COLONOSCOPY    . CYSTOCELE REPAIR N/A 03/02/2018   Procedure: ANTERIOR REPAIR (CYSTOCELE);  Surgeon: Cheri Fowler, MD;  Location: [facility];  Service: Gynecology;  Laterality: N/A;  OUTPATIENT IN BED  . MULTIPLE TOOTH EXTRACTIONS    . RESECTION DISTAL CLAVICAL Left 10/12/2014   Procedure: DISTAL CLAVICLE EXCISION;  Surgeon: Kathryne Hitch, MD;  Location: [facility];  Service: Orthopedics;  Laterality: Left;  . SHOULDER ARTHROSCOPY WITH ROTATOR CUFF REPAIR AND SUBACROMIAL DECOMPRESSION Left 10/12/2014   Procedure: LEFT SHOULDER ARTHROSCOPY, DEBRIDEMENT WITH ACROMIOPLASTY,  ROTATOR CUFF REPAIR AND RELEASE BICEPS TENDON;  Surgeon: Kathryne Hitch, MD;  Location: [facility];  Service: Orthopedics;  Laterality: Left;   Social History   Socioeconomic History  . Marital status: Widowed    Spouse name: Not on file  . Number of children: 1  . Years of education: Not on file  . Highest education level: Not on file  Occupational History  . Occupation: weaver  Tobacco Use  . Smoking status: Former Smoker    Packs/day: 1.00    Years: 57.00    Pack years: 57.00    Types:  Cigarettes    Quit date: 03/20/2017    Years since quitting: 2.4  . Smokeless tobacco: Never Used  . Tobacco comment: <1ppd 02/05/2016  Substance and Sexual Activity  . Alcohol use: Yes    Alcohol/week: 28.0 standard drinks    Types: 14 Cans of beer, 14 Shots of liquor per week    Comment: several beers/day and "a shot of whiskey"  . Drug use: No  . Sexual activity: Not Currently    Birth control/protection: Post-menopausal  Other Topics Concern  . Not on file  Social History Narrative  . Not on file   Social Determinants of Health   Financial Resource Strain:   . Difficulty of Paying Living Expenses: Not on file    Food Insecurity:   . Worried About Charity fundraiser in the Last Year: Not on file  . Ran Out of Food in the Last Year: Not on file  Transportation Needs:   . Lack of Transportation (Medical): Not on file  . Lack of Transportation (Non-Medical): Not on file  Physical Activity:   . Days of Exercise per Week: Not on file  . Minutes of Exercise per Session: Not on file  Stress:   . Feeling of Stress : Not on file  Social Connections:   . Frequency of Communication with Friends and Family: Not on file  . Frequency of Social Gatherings with Friends and Family: Not on file  . Attends Religious Services: Not on file  . Active Member of Clubs or Organizations: Not on file  . Attends Archivist Meetings: Not on file  . Marital Status: Not on file   Family History  Problem Relation Age of Onset  . Heart disease Father   . Kidney failure Father    Allergies  Allergen Reactions  . Tramadol Other (See Comments)    GI UPSET   Prior to Admission medications   Medication Sig Start Date End Date Taking? Authorizing Provider  albuterol (PROAIR HFA) 108 (90 Base) MCG/ACT inhaler 2 puffs every 4 hours as needed only  if your can't catch your breath Patient taking differently: Inhale 2 puffs into the lungs every 4 (four) hours as needed (if unable to catch breath). 2 puffs every 4 hours as needed only  if your can't catch your breath 03/27/17  Yes Tanda Rockers, MD  albuterol (PROVENTIL) (2.5 MG/3ML) 0.083% nebulizer solution Take 3 mLs (2.5 mg total) by nebulization every 6 (six) hours as needed for wheezing or shortness of breath. 10/08/15  Yes Tanda Rockers, MD  Ascorbic Acid (VITAMIN C WITH ROSE HIPS) 500 MG tablet Take 500 mg by mouth daily.   Yes [provider]  budesonide-formoterol (SYMBICORT) 160-4.5 MCG/ACT inhaler Inhale 2 puffs into the lungs 2 (two) times daily. 07/14/18  Yes Tanda Rockers, MD  buPROPion [facility] SR) 150 MG 12 hr tablet Take 150 mg by mouth  daily.  05/26/17  Yes [provider]  cholecalciferol (VITAMIN D) 1000 UNITS tablet Take 1,000 Units by mouth daily.   Yes [provider]  diltiazem (CARDIZEM CD) 360 MG 24 hr capsule Take 360 mg by mouth daily.  12/05/17  Yes [provider]  doxycycline (VIBRA-TABS) 100 MG tablet Take 100 mg by mouth 2 (two) times daily. 08/11/19  Yes [provider]  furosemide (LASIX) 20 MG tablet Take 40 mg by mouth daily as needed for fluid.    Yes [provider]  Garlic 016 MG TABS Take 1  tablet by mouth daily.   Yes [provider]  HYDROcodone-acetaminophen (NORCO) 7.5-325 MG tablet Take 1 tablet by mouth every 4 (four) hours. 08/03/19  Yes [provider]  hydrOXYzine (ATARAX/VISTARIL) 25 MG tablet Take 25 mg by mouth 3 (three) times daily as needed for anxiety. 08/03/19  Yes [provider]  losartan (COZAAR) 100 MG tablet Take 100 mg by mouth daily. 12/10/15  Yes [provider]  Multiple Minerals-Vitamins (CAL MAG ZINC +D3 PO) Take 1 tablet by mouth at bedtime.   Yes [provider]  ondansetron (ZOFRAN) 4 MG tablet Take 2-4 mg by mouth every 6 (six) hours as needed for nausea/vomiting. 07/21/19  Yes [provider]  potassium chloride (K-DUR) 10 MEQ tablet Take 10 mEq by mouth daily as needed (swelling). When takes lasix  10/13/17  Yes [provider]  spironolactone (ALDACTONE) 25 MG tablet Take 1 tablet by mouth 2 (two) times daily. 06/03/18  Yes [provider]  Tiotropium Bromide Monohydrate (SPIRIVA RESPIMAT) 2.5 MCG/ACT AERS INHALE 2 PUFFS INTO THE LUNGS EVERY MORNING Patient taking differently: Inhale 2 puffs into the lungs every morning.  01/12/19  Yes Tanda Rockers, MD  budesonide-formoterol (SYMBICORT) 160-4.5 MCG/ACT inhaler Inhale 2 puffs into the lungs 2 (two) times daily for 1 day. 02/10/19 02/11/19  Tanda Rockers, MD  HYDROcodone-acetaminophen (NORCO/VICODIN) 5-325 MG tablet  Take 1 tablet by mouth every 4 (four) hours as needed. Patient not taking: Reported on 08/18/2019 05/06/18   Tanda Rockers, MD  HYDROcodone-homatropine [facility]) 5-1.5 MG/5ML syrup Take 5 mLs by mouth every 6 (six) hours as needed for cough. Patient not taking: Reported on 08/18/2019 05/20/18   Alma Friendly, MD  OXYGEN Inhale 3 L into the lungs. 24/7 DME- [facility]    [provider]  predniSONE (DELTASONE) 10 MG tablet Take 1 tablet (10 mg total) by mouth daily with breakfast. Patient not taking: Reported on 08/18/2019 02/10/19   Tanda Rockers, MD  Respiratory Therapy Supplies (FLUTTER) DEVI Use as directed 02/05/16   Tanda Rockers, MD  Tiotropium Bromide Monohydrate (SPIRIVA RESPIMAT) 2.5 MCG/ACT AERS Inhale 2 puffs into the lungs daily for 1 day. 02/10/19 02/11/19  Tanda Rockers, MD   No results found.  Positive ROS: All other systems have been reviewed and were otherwise negative with the exception of those mentioned in the HPI and as above.  Physical Exam: Vitals: Refer to EMR. Constitutional:  WD, WN, NAD HEENT:  NCAT, EOMI Neuro/Psych:  Alert & oriented to person, place, and time; appropriate mood & affect Lymphatic: No generalized extremity edema or lymphadenopathy Extremities / MSK:  The extremities are normal with respect to appearance, ranges of motion, joint stability, muscle strength/tone, sensation, & perfusion except as otherwise noted:  Bilateral lower extremities have scattered chronic venous stasis changes.  Left hand is not particularly swollen, full motion.  Right hand has full motion, but swelling and enlargement to the level of the MPs and interior PIPs.  It is boggy swelling, consistent with inflammatory changes, with swollen digits, the long finger is the most swollen and presently has a ring applied.  Assessment: Right hand swollen and painful, likely inflammatory arthropathy.  There are no contractures, and range of motion is actually full, but noted  slow and with discomfort  Plan: I applied soap and water to the right hand and slowly work to get the ring off.  I placed a label upon it and provided to nurse Janett Billow, who indicated she  would appropriately's dose/safeguarded.  In the process, there was a small abrasion with some punctate bleeding near the level of the PIP, dorsal radially.  A Band-Aid was applied.  I encouraged her range of motion exercises, and discussed that the medicine strategy that she is presently on would likely help this to resolve, but that additional flares are possible.  If this fails to resolve or is recurrent, perhaps rheumatology referral would be be helpful.  Questions were invited and answered.  Rayvon Char [address], [place], [place]  [address] Office: [phone] Mobile: [phone]  08/22/2019, 12:16 PM

## 2019-08-22 NOTE — Progress Notes (Signed)
  Speech Language Pathology Treatment: Dysphagia  Patient Details Name: [name] MRN: [id number] DOB: 1945-07-06 Today's Date: 08/22/2019 Time: 5848-3507 SLP Time Calculation (min) (ACUTE ONLY): 32 min  Assessment / Plan / Recommendation Clinical Impression  Pt today sitting upright in bed upon SLP entrance to room with full dinner tray in front of her. SLP offered to cut up food for pt but she initially declined.  She did allow SLP to help within a few minutes.  Pt continues to report pain - and also reports concern for issues on upper gum due to her dentures.   SLP advised her to use wax on her dentures to allow smoother fit.  Observed pt consuming a few bites of rice, meat and salad.  Pt reports all of her food was "hard" and her meat was dry.  SLP offered to change her diet to softer foods but she declined saying she will be out of here soon. She reports she only eats a few bites - she was observed to become slightly dyspneic with intake and SLP advised she eat small amounts, allowing rest breaks if dyspneic.     Reviewed universal sign of "choking" and benefit of respiratory pattern of exhale post-swallow. No indications of aspiration with po noted - but pt is only consuming a few bites/sips.  No SLP follow up indicated as all education completed.     HPI HPI: 74yo female admitted 08/18/19 with generalized weakness x1 month, acute renal failure. PMH: chronic hypoxic respiratory failure, severe COPD on 3L O2, anxiety, PNA (several times, most recent 2018).      SLP Plan  All goals met       Recommendations  Diet recommendations: Regular;Thin liquid Liquids provided via: Cup;Straw Medication Administration: Whole meds with puree Supervision: Patient able to self feed Compensations: Small sips/bites;Slow rate Postural Changes and/or Swallow Maneuvers: Seated upright 90 degrees;Upright 30-60 min after meal                Oral Care Recommendations: Oral care BID Follow up  Recommendations: None(TBD) SLP Visit Diagnosis: Dysphagia, unspecified (R13.10) Plan: All goals met       GO                Macario Golds 08/22/2019, 6:12 PM  Kathleen Lime, MS [place] Office [phone]

## 2019-08-22 NOTE — Care Management Important Message (Signed)
Important Message  Patient Details IM Letter given to Dessa Phi RN Case Manager to present to the Patient Name: [name] MRN: [id number] Date of Birth: May 29, 1946   Medicare Important Message Given:  Yes     Kerin Salen 08/22/2019, 12:05 PM

## 2019-08-22 NOTE — Progress Notes (Signed)
Physical Therapy Treatment Patient Details Name: [name] MRN: [id number] DOB: 1946/02/09 Today's Date: 08/22/2019    History of Present Illness Pt admitted with ARF, weakness, sepsis 2* UTI, dehydration, Macrocytic anemia, and Transaminites, R hand swelling/pain.  Pt with hx of CAD, COPD, pulmonary htn, L RCR, and ETOH abuse.    PT Comments    Pt continues to be dyspneic at rest and with activity. She fatigues easily with activity. Rest breaks/pursed lip breathing required often. O2 94% on 4L, HR 113 bpm, dyspnea 3/4. Continue to recommend SNF.    Follow Up Recommendations  SNF     Equipment Recommendations  None recommended by PT    Recommendations for Other Services       Precautions / Restrictions Precautions Precautions: Fall Restrictions Weight Bearing Restrictions: No    Mobility  Bed Mobility Overal bed mobility: Needs Assistance Bed Mobility: Supine to Sit;Sit to Supine     Supine to sit: Min assist Sit to supine: Min assist   General bed mobility comments: Assist to scoot to EOB and for LEs back onto bed. Increased time.  Transfers Overall transfer level: Needs assistance Equipment used: 1 person hand held assist Transfers: Sit to/from [facility] Sit to Stand: Min assist Stand pivot transfers: Mod assist       General transfer comment: Assist to rise, stabilize, control descent. Stand pivot, bed<>recliner, with 1 hand support and support of bsc vs bedrail. Increased time.  Ambulation/Gait             General Gait Details: NT-too weak, dyspneic   Stairs             Wheelchair Mobility    Modified Rankin (Stroke Patients Only)       Balance Overall balance assessment: Needs assistance           Standing balance-Leahy Scale: Poor                              Cognition Arousal/Alertness: Awake/alert Behavior During Therapy: WFL for tasks assessed/performed Overall Cognitive Status: Within  Functional Limits for tasks assessed                                        Exercises      General Comments        Pertinent Vitals/Pain Pain Assessment: Faces Faces Pain Scale: Hurts even more Pain Location: R hand and generalized pain as well Pain Descriptors / Indicators: Discomfort;Sore Pain Intervention(s): Limited activity within patient's tolerance    Home Living                      Prior Function            PT Goals (current goals can now be found in the care plan section) Progress towards PT goals: Progressing toward goals    Frequency    Min 2X/week      PT Plan Frequency needs to be updated    Co-evaluation              AM-PAC PT "6 Clicks" Mobility   Outcome Measure  Help needed turning from your back to your side while in a flat bed without using bedrails?: A Little Help needed moving from lying on your back to sitting on the side of a flat bed without  using bedrails?: A Little Help needed moving to and from a bed to a chair (including a wheelchair)?: A Lot Help needed standing up from a chair using your arms (e.g., wheelchair or bedside chair)?: A Little Help needed to walk in hospital room?: A Lot Help needed climbing 3-5 steps with a railing? : A Lot 6 Click Score: 15    End of Session Equipment Utilized During Treatment: Oxygen Activity Tolerance: Patient limited by fatigue Patient left: in bed;with call bell/phone within reach;with bed alarm set   PT Visit Diagnosis: Difficulty in walking, not elsewhere classified (R26.2);Muscle weakness (generalized) (M62.81)     Time: 4037-0964 PT Time Calculation (min) (ACUTE ONLY): 22 min  Charges:  $Gait Training: 8-22 mins                         Doreatha Massed, PT Acute Rehabilitation

## 2019-08-22 NOTE — Progress Notes (Signed)
PROGRESS NOTE    [name]    Code Status: Full Code  WUJ:[id number] DOB: 1946-05-01 DOA: 08/18/2019 LOS: 4 days  PCP: Christain Sacramento, MD CC:  Chief Complaint  Patient presents with  . Arm Pain       Hospital Summary   This is a 74 year old female with history of chronic hypoxic respiratory failure secondary to COPD on 3 L continuously, hypertension, anxiety, chronic lower extremity wounds follows at wound care, lower extremity venous reflux, chronic generalized skin changes who presented to [facility] on 3/4 for generalized weakness and intractable nausea/vomiting and found to have AKI.  ED Course:  Vital signs in the ED were notable for the following: Tmax 98.0; HR 70-79; BP 96/50 2-1 100/50 RR 13-22; and SPO2 94 to 98% on 4 L nasal cannula.  Labs were notable for the following: CMP notable for the following: Sodium 137, potassium 5.3, chloride 98, bicarbonate 20, anion gap 19, BUN 87, creatinine 3.56 relative to most recent prior creatinine value of 0.44 in December 2019, albumin 2.2, alkaline phosphatase 282, AST 183, ALT 124, total bilirubin 1.8.  Of note, per chart review, it appears that the most recent previous liver enzymes were performed back in December 2019, at which time they were found to be within normal limits.  CBC notable for white blood cell count 15,600 with 77% neutrophils, hemoglobin 9.4 with MCV 103, relative to most recent prior hemoglobin value of 12.9 in December 2019 platelets 195.  Urinalysis was notable for a cloudy specimen with 21-50 white blood cells, many bacteria, moderate leukocyte esterace, positive hyaline casts, and no evidence of squamous epithelial cells.  Blood cultures x2 were collected prior to initiation of any antibiotics.  Nasopharyngeal COVID-19 PCR swab was obtained, with result currently pending.  Chest x-ray showed no evidence of acute cardiopulmonary process, including no evidence of infiltrate, edema, effusion, or pneumothorax.  Renal  ultrasound was ordered and performed in the ED, with result currently pending.  While in the ED, the following were administered: Lactated Ringer bolus x1 L, albuterol inhaler times x 6 puffs.  Subsequently, the patient was admitted to the stepdown unit for further evaluation and management presenting acute renal failure complicated by hyperkalemia as well as for sepsis due to underlying urinary tract infection.  3/5: Renal function initially improved with LR but then worsened, nephrology consulted  3/6: renal function improved. Nephro signed off. Patient transferred to floor   A & P   Principal Problem:   ARF (acute renal failure) (HCC) Active Problems:   COPD GOLD III   Chronic respiratory failure with hypoxia (HCC)   Hyperkalemia   Transaminitis   Generalized weakness   Leukocytosis   Anemia   Acute renal failure (ARF) (HCC)   Severe sepsis (HCC)   Acute lower UTI   Pressure injury of skin   Protein-calorie malnutrition, severe   1. Generalized weakness likely secondary to hypotension and AKI a. PT/OT -SNF at DC 2. AKI likely secondary to hemodynamic changes, poor p.o. intake, intractable nausea/vomiting Resolved with IV fluids  3. Hypotension of unknown etiology a. resolved with IV fluid boluses and holding home antihypertensives.   b. Will start losartan at decreased dosing 4. Acute on chronic hypoxic respiratory failure secondary to COPD exacerbation a. On chronic 3 L/min nasal cannula, currently 4 L/min  b. Follows with Dr. Melvyn Novas outpatient c. SPO2 goal >88% d. Continue home Symbicort and tiotropium and as needed albuterol inhaler e. Continue prednisone taper 5. Right hand pain,  likely inflammatory arthropathy, gout VS. Pseudogout VS. RA a. ESR, CRP, ferritin elevated, Uric acid: 11.6, RF elevated b. Right hand x-ray with chondrocalcinosis c. Anti CCP pending d. Continue prednisone taper e. Start allopurinol outpatient f. Consulted Ortho since she is not able to  open her hand - also believes inflammatory arthropathy and encouraged ROM exercises and continued medical therapy g. Will restart losartan at lower dose which can help reduce uric acid 6. Intractable nausea/vomiting probably from AKI and electrolyte normalities resolved a. Phenergan for nausea 7. Hyperkalemia Resolved 8. Hypomagnesemia resolved 9. Transaminitis likely hepatic steatosis with some biliary sludge a. Acute hepatitis panel unremarkable b. RUQ ultrasound with layering sludge/small stones without evidence of cholecystitis c. Continue to monitor, if she becomes symptomatic, consider GI consult otherwise outpatient follow-up 10. SIRS secondary to E. coli UTI SIRS resolved a. Completed ceftriaxone x3 days 11. Hypertension a. Losartan at reduced dosing as above, holding HCTZ 12. Acute on chronic macrocytic anemia, unknown etiology a. Ferritin high, iron studies not calculated x2 b. Likely anemia of chronic disease c. Hb improved and stable 13. Generalized anxiety disorder a. Continue home meds 14. Alcohol use a. DC CIWA protocol 15. Chronic lower extremity wounds with history of venous reflux a. Wound care  DVT prophylaxis: Heparin Family Communication: No family at bedside Disposition Plan:   Patient came from:   Home, lives alone                                                                                          Anticipated d/c place: SNF  Barriers to d/c: awaiting repeat COVID testing and SNF placement  Pressure injury documentation   Pressure Injury 08/19/19 Sacrum Mid Stage 2 -  Partial thickness loss of dermis presenting as a shallow open injury with a red, pink wound bed without slough. (Active)  08/19/19 0200  Location: Sacrum  Location Orientation: Mid  Staging: Stage 2 -  Partial thickness loss of dermis presenting as a shallow open injury with a red, pink wound bed without slough.  Wound Description (Comments):   Present on Admission: Yes      Consultants  Nephrology  Procedures  None  Antibiotics   Anti-infectives (From admission, onward)   Start     Dose/Rate Route Frequency Ordered Stop   08/18/19 2315  cefTRIAXone (ROCEPHIN) 1 g in sodium chloride 0.9 % 100 mL IVPB  Status:  Discontinued     1 g 200 mL/hr over 30 Minutes Intravenous Every 24 hours 08/18/19 2315 08/21/19 1523        Subjective   States she feels the same as when she came in despite having her labs improved significantly. Admits to persistent right hand pain/difficulty opening but no changes overall.   Objective   Vitals:   08/21/19 2023 08/21/19 2113 08/22/19 0904 08/22/19 1258  BP: 123/85   116/65  Pulse: (!) 105   (!) 103  Resp: 20   14  Temp: 98.5 F (36.9 C)   99 F (37.2 C)  TempSrc: Oral   Oral  SpO2: 99% 99% 95% 97%  Weight:      Height:  Intake/Output Summary (Last 24 hours) at 08/22/2019 1349 Last data filed at 08/22/2019 1306 Gross per 24 hour  Intake 843 ml  Output 100 ml  Net 743 ml   Filed Weights   08/19/19 0200 08/20/19 0500 08/21/19 0500  Weight: 51.9 kg 51.2 kg 51.4 kg    Examination:  Physical Exam Vitals and nursing note reviewed.  Constitutional:      Appearance: She is not toxic-appearing.  HENT:     Head: Normocephalic.     Mouth/Throat:     Mouth: Mucous membranes are moist.  Eyes:     Conjunctiva/sclera: Conjunctivae normal.  Cardiovascular:     Rate and Rhythm: Normal rate and regular rhythm.  Pulmonary:     Effort: Pulmonary effort is normal. No respiratory distress.  Musculoskeletal:     Comments: Right hand with erythema, tenderness to palpation over joints as before  Neurological:     Mental Status: She is alert. Mental status is at baseline.  Psychiatric:        Mood and Affect: Mood normal.        Behavior: Behavior normal.       Data Reviewed: I have personally reviewed following labs and imaging studies  CBC: Recent Labs  Lab 08/18/19 1919 08/18/19 2200  08/19/19 0239 08/19/19 1520 08/20/19 0222 08/21/19 0915 08/22/19 0506  WBC 15.6*   < > 14.5* 12.9* 10.7* 9.9 10.2  NEUTROABS 12.1*  --  11.8*  --   --   --   --   HGB 9.4*   < > 8.6* 8.7* 8.7* 9.6* 8.2*  HCT 29.0*   < > 26.7* 25.8* 26.6* 30.6* 25.6*  MCV 102.8*   < > 100.4* 100.0 100.4* 102.3* 102.8*  PLT PLATELET CLUMPS NOTED ON SMEAR, COUNT APPEARS DECREASED   < > 171 164 143* 153 145*   < > = values in this interval not displayed.   Basic Metabolic Panel: Recent Labs  Lab 08/19/19 0120 08/19/19 0120 08/19/19 0239 08/19/19 1520 08/20/19 0222 08/21/19 0915 08/22/19 0506  NA 138   < > 137 142 143 143 141  K 4.7   < > 5.0 4.5 4.2 4.4 4.2  CL 102   < > 101 104 108 105 106  CO2 19*   < > 21* _0 GLUCOSE 78   < > 102* 117* 124* 169* 139*  BUN 74*   < > 86* 73* 55* 41* 36*  CREATININE 2.63*   < > 2.98* 1.78* 1.11* 0.71 0.64  CALCIUM 7.8*   < > 8.5* 8.2* 8.3* 8.1* 7.8*  MG 1.3*  --  1.6* 2.6* 2.2  --  1.6*  PHOS  --   --  4.5 2.9  --   --   --    < > = values in this interval not displayed.   GFR: Estimated Creatinine Clearance: 49.5 mL/min (by C-G formula based on SCr of 0.64 mg/dL). Liver Function Tests: Recent Labs  Lab 08/18/19 1919 08/19/19 0239 08/19/19 1520  AST 183* 130* 102*  ALT 124* 109* 95*  ALKPHOS 282* 259* 273*  BILITOT 1.8* 1.6* 1.6*  PROT 5.4* 5.0* 4.8*  ALBUMIN 2.2* 2.0* 2.0*   No results for input(s): LIPASE, AMYLASE in the last 168 hours. Recent Labs  Lab 08/19/19 0239  AMMONIA 33   Coagulation Profile: Recent Labs  Lab 08/18/19 1919 08/19/19 0239  INR 1.0 1.1   Cardiac Enzymes: Recent Labs  Lab 08/19/19 1117  CKTOTAL 47  BNP (last 3 results) No results for input(s): PROBNP in the last 8760 hours. HbA1C: No results for input(s): HGBA1C in the last 72 hours. CBG: No results for input(s): GLUCAP in the last 168 hours. Lipid Profile: No results for input(s): CHOL, HDL, LDLCALC, TRIG, CHOLHDL, LDLDIRECT in the last 72  hours. Thyroid Function Tests: No results for input(s): TSH, T4TOTAL, FREET4, T3FREE, THYROIDAB in the last 72 hours. Anemia Panel: Recent Labs    08/19/19 1644  VITAMINB12 1,265*  FERRITIN 1,228*  TIBC NOT CALCULATED  IRON 38   Sepsis Labs: Recent Labs  Lab 08/19/19 0120 08/19/19 0239  LATICACIDVEN 4.7* 1.0    Recent Results (from the past 240 hour(s))  SARS CORONAVIRUS 2 (TAT 6-24 HRS) Nasopharyngeal Nasopharyngeal Swab     Status: None   Collection Time: 08/18/19  7:56 PM   Specimen: Nasopharyngeal Swab  Result Value Ref Range Status   SARS Coronavirus 2 NEGATIVE NEGATIVE Final    Comment: (NOTE) SARS-CoV-2 target nucleic acids are NOT DETECTED. The SARS-CoV-2 RNA is generally detectable in upper and lower respiratory specimens during the acute phase of infection. Negative results do not preclude SARS-CoV-2 infection, do not rule out co-infections with other pathogens, and should not be used as the sole basis for treatment or other patient management decisions. Negative results must be combined with clinical observations, patient history, and epidemiological information. The expected result is Negative. Fact Sheet for Patients: SugarRoll.be Fact Sheet for Healthcare Providers: https://www.woods-mathews.com/ This test is not yet approved or cleared by the [place] FDA and  has been authorized for detection and/or diagnosis of SARS-CoV-2 by FDA under an Emergency Use Authorization (EUA). This EUA will remain  in effect (meaning this test can be used) for the duration of the COVID-19 declaration under Section 56 4(b)(1) of the Act, 21 U.S.C. section 360bbb-3(b)(1), unless the authorization is terminated or revoked sooner. Performed at [facility], [place] [address] [place], [place] [address]   Culture, blood (routine x 2)     Status: None (Preliminary result)   Collection Time: 08/18/19  9:52 PM   Specimen: BLOOD   Result Value Ref Range Status   Specimen Description   Final    BLOOD RIGHT ANTECUBITAL Performed at [facility] [address], [place], [place] [address]    Special Requests   Final    BOTTLES DRAWN AEROBIC AND ANAEROBIC Blood Culture adequate volume Performed at [facility] [address] [place], [place] [address]    Culture   Final    NO GROWTH 4 DAYS Performed at [facility], [place] [address], [place], [place] [address]    Report Status PENDING  Incomplete  Culture, blood (routine x 2)     Status: None (Preliminary result)   Collection Time: 08/18/19 10:00 PM   Specimen: BLOOD  Result Value Ref Range Status   Specimen Description   Final    BLOOD LEFT ANTECUBITAL Performed at [facility] [address] [place], [place] [address]    Special Requests   Final    BOTTLES DRAWN AEROBIC AND ANAEROBIC Blood Culture results may not be optimal due to an inadequate volume of blood received in culture bottles Performed at [facility] [address], [place], [place] [address]    Culture   Final    NO GROWTH 4 DAYS Performed at [facility], [place] [address] [place], [place] [address]    Report Status  PENDING  Incomplete  MRSA PCR Screening     Status: Abnormal   Collection Time: 08/19/19  1:36 AM   Specimen: Nasal Mucosa; Nasopharyngeal  Result Value Ref Range Status   MRSA by PCR POSITIVE (A) NEGATIVE Final    Comment:        The GeneXpert MRSA Assay (FDA approved for NASAL specimens only), is one component of a comprehensive MRSA colonization surveillance program. It is not intended to diagnose MRSA infection nor to guide or monitor treatment for MRSA infections. RESULT CALLED TO, READ BACK BY AND VERIFIED WITH: KOONTEZ, A. @ [phone] 08/19/2019 PERRY, J. Performed at [facility], [place] [address] [place], [place] [address]   Culture, Urine     Status: Abnormal    Collection Time: 08/19/19  5:00 PM   Specimen: Urine, Random  Result Value Ref Range Status   Specimen Description   Final    URINE, RANDOM Performed at [facility] [address], [place], [place] [address]    Special Requests   Final    NONE Performed at [facility], [place] [address], [place], [place] [address]    Culture >=100,000 COLONIES/mL ESCHERICHIA COLI (A)  Final   Report Status 08/21/2019 FINAL  Final   Organism ID, Bacteria ESCHERICHIA COLI (A)  Final      Susceptibility   Escherichia coli - MIC*    AMPICILLIN >=32 RESISTANT Resistant     CEFAZOLIN 8 SENSITIVE Sensitive     CEFTRIAXONE <=0.25 SENSITIVE Sensitive     CIPROFLOXACIN <=0.25 SENSITIVE Sensitive     GENTAMICIN <=1 SENSITIVE Sensitive     IMIPENEM <=0.25 SENSITIVE Sensitive     NITROFURANTOIN <=16 SENSITIVE Sensitive     TRIMETH/SULFA <=20 SENSITIVE Sensitive     AMPICILLIN/SULBACTAM >=32 RESISTANT Resistant     PIP/TAZO <=4 SENSITIVE Sensitive     * >=100,000 COLONIES/mL ESCHERICHIA COLI         Radiology Studies: No results found.      Scheduled Meds: . buPROPion  150 mg Oral Daily  . diltiazem  240 mg Oral Daily  . feeding supplement (ENSURE ENLIVE)  237 mL Oral BID BM  . folic acid  1 mg Oral Daily  . furosemide  20 mg Oral Daily  . heparin  5,000 Units Subcutaneous Q8H  . losartan  25 mg Oral Daily  . mouth rinse  15 mL Mouth Rinse BID  . mometasone-formoterol  2 puff Inhalation BID  . multivitamin with minerals  1 tablet Oral Daily  . mupirocin ointment  1 application Nasal BID  . nutrition supplement (JUVEN)  1 packet Oral BID BM  . nystatin  5 mL Oral QID  . [START ON 08/23/2019] predniSONE  30 mg Oral Q breakfast   Followed by  . [START ON 08/26/2019] predniSONE  20 mg Oral Q breakfast   Followed by  . [START ON 08/29/2019] predniSONE  10 mg Oral Q breakfast  . sodium chloride flush  3 mL Intravenous Q12H  . thiamine  100 mg Oral Daily    Or  . thiamine  100 mg Intravenous Daily  . umeclidinium bromide  1 puff Inhalation q morning - 10a   Continuous Infusions: . sodium chloride 250 mL (08/22/19 0930)     Time spent: 24 minutes with over 50% of the time coordinating the patient's care    Harold Hedge, DO [facility] Pager [phone]  Call night coverage person covering after 7pm

## 2019-08-22 NOTE — TOC Progression Note (Signed)
Transition of Care [facility]) - Progression Note    Patient Details  Name: [name] MRN: [id number] Date of Birth: [birth year]  Transition of Care [facility]) CM/SW Contact  Essynce Munsch, Juliann Pulse, RN Phone Number: 08/22/2019, 3:22 PM  Clinical Narrative:  Faxed out to SNF await bed offers.          Expected Discharge Plan and Services                                                 Social Determinants of Health (SDOH) Interventions    Readmission Risk Interventions No flowsheet data found.

## 2019-08-23 LAB — BASIC METABOLIC PANEL
Anion gap: 7 (ref 5–15)
BUN: 31 mg/dL — ABNORMAL HIGH (ref 8–23)
CO2: 32 mmol/L (ref 22–32)
Calcium: 8.5 mg/dL — ABNORMAL LOW (ref 8.9–10.3)
Chloride: 104 mmol/L (ref 98–111)
Creatinine, Ser: 0.58 mg/dL (ref 0.44–1.00)
GFR calc Af Amer: >60 mL/min (ref >60)
GFR calc non Af Amer: >60 mL/min (ref >60)
Glucose, Bld: 109 mg/dL — ABNORMAL HIGH (ref 70–99)
Potassium: 4.3 mmol/L (ref 3.5–5.1)
Sodium: 143 mmol/L (ref 135–145)

## 2019-08-23 LAB — CBC
HCT: 27.8 % — ABNORMAL LOW (ref 36.0–46.0)
Hemoglobin: 9 g/dL — ABNORMAL LOW (ref 12.0–15.0)
MCH: 33.2 pg (ref 26.0–34.0)
MCHC: 32.4 g/dL (ref 30.0–36.0)
MCV: 102.6 fL — ABNORMAL HIGH (ref 80.0–100.0)
Platelets: 151 10*3/uL (ref 150–400)
RBC: 2.71 MIL/uL — ABNORMAL LOW (ref 3.87–5.11)
RDW: 18.6 % — ABNORMAL HIGH (ref 11.5–15.5)
WBC: 11.4 10*3/uL — ABNORMAL HIGH (ref 4.0–10.5)
nRBC: 0.3 % — ABNORMAL HIGH (ref 0.0–0.2)

## 2019-08-23 LAB — CULTURE, BLOOD (ROUTINE X 2)
Culture: NO GROWTH
Culture: NO GROWTH
Special Requests: ADEQUATE

## 2019-08-23 LAB — CYCLIC CITRUL PEPTIDE ANTIBODY, IGG/IGA: CCP Antibodies IgG/IgA: 4 units (ref 0–19)

## 2019-08-23 LAB — MAGNESIUM: Magnesium: 1.9 mg/dL (ref 1.7–2.4)

## 2019-08-23 LAB — SARS CORONAVIRUS 2 (TAT 6-24 HRS): SARS Coronavirus 2: NEGATIVE

## 2019-08-23 MED ORDER — LOSARTAN POTASSIUM 25 MG PO TABS: 25.0000 mg | ORAL_TABLET | Freq: Every day | ORAL | 0 refills | Status: DC

## 2019-08-23 MED ORDER — PREDNISONE 10 MG PO TABS: ORAL_TABLET | ORAL | 0 refills | Status: AC

## 2019-08-23 NOTE — Discharge Summary (Signed)
Physician Discharge Summary  BREI POCIASK ZHY:[id number] DOB: [birth year]   PCP: Christain Sacramento, MD  Admit date: 08/18/2019 Discharge date: 08/23/2019 Length of Stay: 5 days   Code Status: Full Code  Admitted From:  Home Discharged to:  SNF Discharge Condition:  Stable  Recommendations for Outpatient Follow-up   1. Follow-up with rheumatology referral for inflammatory arthropathy, gout versus pseudogout possible further work-up 2. Decrease losartan to 25 mg daily as BP stable and patient had AKI.  Losartan to help with elevated uric acid 3. Follow-up with pulmonologist outpatient  Hospital Summary  This is a 74 year old female with history of chronic hypoxic respiratory failure secondary to COPD on 3 L continuously, hypertension, anxiety, chronic lower extremity wounds follows at wound care, lower extremity venous reflux, chronic generalized skin changes who presented to [facility] on 3/4 for generalized weakness and intractable nausea/vomiting and found to have AKI.  ED Course: Vital signs in the ED were notable for the following:Tmax 98.0; HR 70-79; BP 96/50 2-1 100/50 RR 13-22; and SPO2 94 to 98% on 4 L nasal cannula.  Labs were notable for the following:CMP notable for the following: Sodium 137, potassium 5.3, chloride 98, bicarbonate 20, anion gap 19, BUN 87, creatinine 3.56 relative to most recent prior creatinine value of 0.44 in December 2019, albumin 2.2, alkaline phosphatase 282, AST 183, ALT 124, total bilirubin 1.8.Of note, per chart review, it appears that the most recent previous liver enzymes were performed back in December 2019, at which time they were found to be within normal limits. CBC notable for white blood cell count 15,600 with 77% neutrophils, hemoglobin 9.4 with MCV 103, relative to most recent prior hemoglobin value of 12.9 in December 2019 platelets 195.  Urinalysis was notable for a cloudy specimen with 21-50 white blood cells, many bacteria, moderate  leukocyteesterace,positive hyaline casts, and no evidence of squamous epithelial cells. Blood cultures x2 were collected prior to initiation of any antibiotics. Nasopharyngeal COVID-19 PCR swab was obtained, with result currently pending.  Chest x-ray showed no evidence of acute cardiopulmonary process, including no evidence of infiltrate, edema, effusion, or pneumothorax. Renal ultrasound was ordered and performed in the ED, with result currently pending.  While in the ED, the following were administered:Lactated Ringer bolus x1 L, albuterol inhaler timesx 6 puffs.Subsequently, the patient was admitted to the stepdown unit for further evaluation and management presenting acute renal failure complicated by hyperkalemia as well as for sepsis due to underlying urinary tract infection.  3/5: Renal function initially improved with LR but then worsened, nephrology consulted  3/6: renal function improved. Nephro signed off. Patient transferred to floor  3/7: Started on prednisone taper for right hand inflammatory arthropathy, uric acid 11+ and elevated inflammatory markers.  Right hand x-ray with chondrocalcinosis 3/8: Difficulty opening right hand persistently, hand surgery consulted for further recommendations who recommended PT and exercises  A & P   Principal Problem:   ARF (acute renal failure) ([place]) Active Problems:   COPD GOLD III   Chronic respiratory failure with hypoxia (HCC)   Hyperkalemia   Transaminitis   Generalized weakness   Leukocytosis   Anemia   Acute renal failure (ARF) (HCC)   Severe sepsis (HCC)   Acute lower UTI   Pressure injury of skin   Protein-calorie malnutrition, severe   Inflammatory arthropathy    1. Generalized weakness likely secondary to hypotension and AKI a. PT/OT -SNF at DC b. Orthostatics negative 2. AKI likely secondary to hemodynamic changes, poor p.o. intake, intractable  nausea/vomiting Resolved with IV fluids and holding  ARB 3. Hypotension of unknown etiology a. resolved with IV fluid boluses and holding home antihypertensives.   b. Will restart losartan at decreased dosing 4. Acute on chronic hypoxic respiratory failure secondary to COPD exacerbation a. On chronic 3 L/min nasal cannula, still at 4 L/min at discharge, continue incentive spirometry and ambulation at discharge b. Follows with Dr. Melvyn Novas outpatient c. SPO2 goal >88% d. Continue home Symbicort and tiotropium and as needed albuterol inhaler e. Continue prednisone taper 5. Right hand pain, likely inflammatory arthropathy, gout VS. Pseudogout VS. Less Likely RA a. ESR, CRP, ferritin elevated, Uric acid: 11.6, RF elevated b. Right hand x-ray with chondrocalcinosis c. Anti CCP negative d. Continue prednisone taper e. Start allopurinol outpatient f. Consulted Ortho since she is not able to open her hand - also believes inflammatory arthropathy and encouraged ROM exercises and continued medical therapy g. Will restart losartan at lower dose which can help reduce uric acid 6. Intractable nausea/vomiting probably from AKI and electrolyte normalities resolved a. Phenergan for nausea 7. Hyperkalemia Resolved 8. Hypomagnesemia resolved 9. Transaminitis likely hepatic steatosis with some biliary sludge a. Acute hepatitis panel unremarkable b. RUQ ultrasound with layering sludge/small stones without evidence of cholecystitis c. Continue to monitor, if she becomes symptomatic, consider GI consult otherwise outpatient follow-up 10. SIRS secondary to E. coli UTI SIRS resolved a. Completed ceftriaxone x3 days 11. Hypertension a. Losartan at reduced dosing as above, holding HCTZ 12. Acute on chronic macrocytic anemia, unknown etiology a. Ferritin high, iron studies not calculated x2 b. Likely anemia of chronic disease c. Hb improved and stable 13. Generalized anxiety disorder a. Continue home meds 14. Alcohol use a. Not require CIWA protocol 15. Chronic  lower extremity wounds with history of venous reflux a. Wound care    Consultants  . Nephrology . Ortho  Procedures  . None  Antibiotics   Anti-infectives (From admission, onward)   Start     Dose/Rate Route Frequency Ordered Stop   08/18/19 2315  cefTRIAXone (ROCEPHIN) 1 g in sodium chloride 0.9 % 100 mL IVPB  Status:  Discontinued     1 g 200 mL/hr over 30 Minutes Intravenous Every 24 hours 08/18/19 2315 08/21/19 1523       Subjective  She is a poor historian  Seen and examined resting comfortably at bedside no acute distress.  States she still feels generalized weakness and reports no significant improvement despite improved labs.  States she has these chronic issues.  Denies any chest pain, nausea, vomiting, diarrhea, or any other complaints.   Objective   Discharge Exam: Vitals:   08/23/19 0550 08/23/19 0813  BP: 110/63   Pulse: 86   Resp: 16   Temp: 98.4 F (36.9 C)   SpO2: 97% 97%   Vitals:   08/22/19 1947 08/22/19 2005 08/23/19 0550 08/23/19 0813  BP:  108/64 110/63   Pulse:  87 86   Resp:  18 16   Temp:  98.2 F (36.8 C) 98.4 F (36.9 C)   TempSrc:  Oral Oral   SpO2: 97% 99% 97% 97%  Weight:      Height:        Physical Exam Vitals and nursing note reviewed.  Constitutional:      Comments: Frail elderly female  HENT:     Head: Normocephalic.     Mouth/Throat:     Mouth: Mucous membranes are moist.  Eyes:     Conjunctiva/sclera: Conjunctivae normal.  Cardiovascular:  Rate and Rhythm: Normal rate and regular rhythm.     Heart sounds: Murmur present.  Pulmonary:     Effort: Pulmonary effort is normal. No respiratory distress.     Comments: On nasal cannula Abdominal:     General: Abdomen is flat.     Palpations: Abdomen is soft.  Musculoskeletal:     Right lower leg: No edema.     Left lower leg: No edema.     Comments: Right hand erythema significantly improved over the past several days and appears nearly resolved, improved but  still difficulty opening her right hand  Neurological:     Mental Status: She is alert. Mental status is at baseline.  Psychiatric:        Mood and Affect: Mood normal.        Behavior: Behavior normal.       The results of significant diagnostics from this hospitalization (including imaging, microbiology, ancillary and laboratory) are listed below for reference.     Microbiology: Recent Results (from the past 240 hour(s))  SARS CORONAVIRUS 2 (TAT 6-24 HRS) Nasopharyngeal Nasopharyngeal Swab     Status: None   Collection Time: 08/18/19  7:56 PM   Specimen: Nasopharyngeal Swab  Result Value Ref Range Status   SARS Coronavirus 2 NEGATIVE NEGATIVE Final    Comment: (NOTE) SARS-CoV-2 target nucleic acids are NOT DETECTED. The SARS-CoV-2 RNA is generally detectable in upper and lower respiratory specimens during the acute phase of infection. Negative results do not preclude SARS-CoV-2 infection, do not rule out co-infections with other pathogens, and should not be used as the sole basis for treatment or other patient management decisions. Negative results must be combined with clinical observations, patient history, and epidemiological information. The expected result is Negative. Fact Sheet for Patients: SugarRoll.be Fact Sheet for Healthcare Providers: https://www.woods-mathews.com/ This test is not yet approved or cleared by the [place] FDA and  has been authorized for detection and/or diagnosis of SARS-CoV-2 by FDA under an Emergency Use Authorization (EUA). This EUA will remain  in effect (meaning this test can be used) for the duration of the COVID-19 declaration under Section 56 4(b)(1) of the Act, 21 U.S.C. section 360bbb-3(b)(1), unless the authorization is terminated or revoked sooner. Performed at [facility], [place] [address], [place], [place] [address]   Culture, blood (routine x 2)     Status: None (Preliminary  result)   Collection Time: 08/18/19  9:52 PM   Specimen: BLOOD  Result Value Ref Range Status   Specimen Description   Final    BLOOD RIGHT ANTECUBITAL Performed at [facility] [address], [place], [place] [address]    Special Requests   Final    BOTTLES DRAWN AEROBIC AND ANAEROBIC Blood Culture adequate volume Performed at [facility] [address], [place], [place] [address]    Culture   Final    NO GROWTH 4 DAYS Performed at [facility], [place] [address], [place], [place] [address]    Report Status PENDING  Incomplete  Culture, blood (routine x 2)     Status: None (Preliminary result)   Collection Time: 08/18/19 10:00 PM   Specimen: BLOOD  Result Value Ref Range Status   Specimen Description   Final    BLOOD LEFT ANTECUBITAL Performed at [facility] [address], [place], [place] [address]    Special Requests   Final    BOTTLES DRAWN AEROBIC AND ANAEROBIC Blood Culture  results may not be optimal due to an inadequate volume of blood received in culture bottles Performed at [facility], [place] [address], [place], [place] [address]    Culture   Final    NO GROWTH 4 DAYS Performed at [facility], [place] [address] [place], [place] [address]    Report Status PENDING  Incomplete  MRSA PCR Screening     Status: Abnormal   Collection Time: 08/19/19  1:36 AM   Specimen: Nasal Mucosa; Nasopharyngeal  Result Value Ref Range Status   MRSA by PCR POSITIVE (A) NEGATIVE Final    Comment:        The GeneXpert MRSA Assay (FDA approved for NASAL specimens only), is one component of a comprehensive MRSA colonization surveillance program. It is not intended to diagnose MRSA infection nor to guide or monitor treatment for MRSA infections. RESULT CALLED TO, READ BACK BY AND VERIFIED WITH: KOONTEZ, A. @ [phone] 08/19/2019 PERRY, J. Performed at [facility], [place] [address] [place], [place] [address]   Culture, Urine     Status: Abnormal   Collection Time: 08/19/19  5:00 PM   Specimen: Urine, Random  Result Value Ref Range Status   Specimen Description   Final    URINE, RANDOM Performed at [facility] [address], [place], [place] [address]    Special Requests   Final    NONE Performed at [facility], [place] [address], [place], [place] [address]    Culture >=100,000 COLONIES/mL ESCHERICHIA COLI (A)  Final   Report Status 08/21/2019 FINAL  Final   Organism ID, Bacteria ESCHERICHIA COLI (A)  Final      Susceptibility   Escherichia coli - MIC*    AMPICILLIN >=32 RESISTANT Resistant     CEFAZOLIN 8 SENSITIVE Sensitive     CEFTRIAXONE <=0.25 SENSITIVE Sensitive     CIPROFLOXACIN <=0.25 SENSITIVE Sensitive     GENTAMICIN <=1 SENSITIVE Sensitive     IMIPENEM <=0.25 SENSITIVE Sensitive     NITROFURANTOIN <=16 SENSITIVE Sensitive     TRIMETH/SULFA <=20 SENSITIVE Sensitive     AMPICILLIN/SULBACTAM >=32 RESISTANT Resistant     PIP/TAZO <=4 SENSITIVE Sensitive     * >=100,000 COLONIES/mL ESCHERICHIA COLI  SARS CORONAVIRUS 2 (TAT 6-24 HRS) Nasopharyngeal Nasopharyngeal Swab     Status: None   Collection Time: 08/22/19  6:15 PM   Specimen: Nasopharyngeal Swab  Result Value Ref Range Status   SARS Coronavirus 2 NEGATIVE NEGATIVE Final    Comment: (NOTE) SARS-CoV-2 target nucleic acids are NOT DETECTED. The SARS-CoV-2 RNA is generally detectable in upper and lower respiratory specimens during the acute phase of infection. Negative results do not preclude SARS-CoV-2 infection, do not rule out co-infections with other pathogens, and should not be used as the sole basis for treatment or other patient management decisions. Negative results must be combined with clinical observations, patient history, and epidemiological information. The expected result is Negative. Fact Sheet for  Patients: SugarRoll.be Fact Sheet for Healthcare Providers: https://www.woods-mathews.com/ This test is not yet approved or cleared by the [place] FDA and  has been authorized for detection and/or diagnosis of SARS-CoV-2 by FDA under an Emergency Use Authorization (EUA). This EUA will remain  in effect (meaning this test can be used) for the duration of the COVID-19 declaration under Section 56 4(b)(1) of the Act, 21 U.S.C. section 360bbb-3(b)(1), unless the authorization is terminated or revoked sooner. Performed at [facility],  [address] [place], [place] [address]      Labs: BNP (last 3 results) No results for input(s): BNP in the last 8760 hours. Basic Metabolic Panel: Recent Labs  Lab 08/19/19 0239 08/19/19 0239 08/19/19 1520 08/20/19 0222 08/21/19 0915 08/22/19 0506 08/23/19 0808  NA 137   < > 142 143 143 141 143  K 5.0   < > 4.5 4.2 4.4 4.2 4.3  CL 101   < > 104 108 105 106 104  CO2 21*   < > _0 32  GLUCOSE 102*   < > 117* 124* 169* 139* 109*  BUN 86*   < > 73* 55* 41* 36* 31*  CREATININE 2.98*   < > 1.78* 1.11* 0.71 0.64 0.58  CALCIUM 8.5*   < > 8.2* 8.3* 8.1* 7.8* 8.5*  MG 1.6*  --  2.6* 2.2  --  1.6* 1.9  PHOS 4.5  --  2.9  --   --   --   --    < > = values in this interval not displayed.   Liver Function Tests: Recent Labs  Lab 08/18/19 1919 08/19/19 0239 08/19/19 1520  AST 183* 130* 102*  ALT 124* 109* 95*  ALKPHOS 282* 259* 273*  BILITOT 1.8* 1.6* 1.6*  PROT 5.4* 5.0* 4.8*  ALBUMIN 2.2* 2.0* 2.0*   No results for input(s): LIPASE, AMYLASE in the last 168 hours. Recent Labs  Lab 08/19/19 0239  AMMONIA 33   CBC: Recent Labs  Lab 08/18/19 1919 08/18/19 2200 08/19/19 0239 08/19/19 0239 08/19/19 1520 08/20/19 0222 08/21/19 0915 08/22/19 0506 08/23/19 0808  WBC 15.6*   < > 14.5*   < > 12.9* 10.7* 9.9 10.2 11.4*  NEUTROABS 12.1*  --  11.8*  --   --   --   --   --   --    HGB 9.4*   < > 8.6*   < > 8.7* 8.7* 9.6* 8.2* 9.0*  HCT 29.0*   < > 26.7*   < > 25.8* 26.6* 30.6* 25.6* 27.8*  MCV 102.8*   < > 100.4*   < > 100.0 100.4* 102.3* 102.8* 102.6*  PLT PLATELET CLUMPS NOTED ON SMEAR, COUNT APPEARS DECREASED   < > 171   < > 164 143* 153 145* 151   < > = values in this interval not displayed.   Cardiac Enzymes: Recent Labs  Lab 08/19/19 1117  CKTOTAL 47   BNP: Invalid input(s): POCBNP CBG: No results for input(s): GLUCAP in the last 168 hours. D-Dimer No results for input(s): DDIMER in the last 72 hours. Hgb A1c No results for input(s): HGBA1C in the last 72 hours. Lipid Profile No results for input(s): CHOL, HDL, LDLCALC, TRIG, CHOLHDL, LDLDIRECT in the last 72 hours. Thyroid function studies No results for input(s): TSH, T4TOTAL, T3FREE, THYROIDAB in the last 72 hours.  Invalid input(s): FREET3 Anemia work up No results for input(s): VITAMINB12, FOLATE, FERRITIN, TIBC, IRON, RETICCTPCT in the last 72 hours. Urinalysis    Component Value Date/Time   COLORURINE YELLOW 08/18/2019 2150   APPEARANCEUR CLOUDY (A) 08/18/2019 2150   LABSPEC 1.018 08/18/2019 2150   PHURINE 5.0 08/18/2019 2150   GLUCOSEU NEGATIVE 08/18/2019 2150   HGBUR NEGATIVE 08/18/2019 2150   BILIRUBINUR NEGATIVE 08/18/2019 2150   KETONESUR NEGATIVE 08/18/2019 2150   PROTEINUR NEGATIVE 08/18/2019 2150   NITRITE NEGATIVE 08/18/2019 2150   LEUKOCYTESUR MODERATE (A) 08/18/2019 2150   Sepsis Labs Invalid input(s): PROCALCITONIN,  WBC,  [place] [facility] Recent Results (from the past 240 hour(s))  SARS CORONAVIRUS 2 (TAT 6-24 HRS) Nasopharyngeal Nasopharyngeal Swab     Status: None   Collection Time: 08/18/19  7:56 PM   Specimen: Nasopharyngeal Swab  Result Value Ref Range Status   SARS Coronavirus 2 NEGATIVE NEGATIVE Final    Comment: (NOTE) SARS-CoV-2 target nucleic acids are NOT DETECTED. The SARS-CoV-2 RNA is generally detectable in upper and lower respiratory  specimens during the acute phase of infection. Negative results do not preclude SARS-CoV-2 infection, do not rule out co-infections with other pathogens, and should not be used as the sole basis for treatment or other patient management decisions. Negative results must be combined with clinical observations, patient history, and epidemiological information. The expected result is Negative. Fact Sheet for Patients: SugarRoll.be Fact Sheet for Healthcare Providers: https://www.woods-mathews.com/ This test is not yet approved or cleared by the [place] FDA and  has been authorized for detection and/or diagnosis of SARS-CoV-2 by FDA under an Emergency Use Authorization (EUA). This EUA will remain  in effect (meaning this test can be used) for the duration of the COVID-19 declaration under Section 56 4(b)(1) of the Act, 21 U.S.C. section 360bbb-3(b)(1), unless the authorization is terminated or revoked sooner. Performed at [facility], [place] [address] [place], [place] [address]   Culture, blood (routine x 2)     Status: None (Preliminary result)   Collection Time: 08/18/19  9:52 PM   Specimen: BLOOD  Result Value Ref Range Status   Specimen Description   Final    BLOOD RIGHT ANTECUBITAL Performed at [facility] [address] [place], [place] [address]    Special Requests   Final    BOTTLES DRAWN AEROBIC AND ANAEROBIC Blood Culture adequate volume Performed at [facility] [address] [place], [place] [address]    Culture   Final    NO GROWTH 4 DAYS Performed at [facility], [place] [address], [place], [place] [address]    Report Status PENDING  Incomplete  Culture, blood (routine x 2)     Status: None (Preliminary result)   Collection Time: 08/18/19 10:00 PM   Specimen: BLOOD  Result Value Ref Range Status   Specimen Description   Final    BLOOD LEFT ANTECUBITAL Performed at  [facility] [address], [place], [place] [address]    Special Requests   Final    BOTTLES DRAWN AEROBIC AND ANAEROBIC Blood Culture results may not be optimal due to an inadequate volume of blood received in culture bottles Performed at [facility] [address] [place], [place] [address]    Culture   Final    NO GROWTH 4 DAYS Performed at [facility], [place] [address] [place], [place] [address]    Report Status PENDING  Incomplete  MRSA PCR Screening     Status: Abnormal   Collection Time: 08/19/19  1:36 AM   Specimen: Nasal Mucosa; Nasopharyngeal  Result Value Ref Range Status   MRSA by PCR POSITIVE (A) NEGATIVE Final    Comment:        The GeneXpert MRSA Assay (FDA approved for NASAL specimens only), is one component of a comprehensive MRSA colonization surveillance program. It is not intended to diagnose MRSA infection nor to guide or monitor treatment for MRSA infections. RESULT CALLED TO, READ BACK BY AND VERIFIED WITH: KOONTEZ, A. @ [phone] 08/19/2019 PERRY, J. Performed at [facility]  [place] [address], [place], [place] [address]   Culture, Urine     Status: Abnormal   Collection Time: 08/19/19  5:00 PM   Specimen: Urine, Random  Result Value Ref Range Status   Specimen Description   Final    URINE, RANDOM Performed at [facility] [address], [place], [place] [address]    Special Requests   Final    NONE Performed at [facility], [place] [address], [place], [place] [address]    Culture >=100,000 COLONIES/mL ESCHERICHIA COLI (A)  Final   Report Status 08/21/2019 FINAL  Final   Organism ID, Bacteria ESCHERICHIA COLI (A)  Final      Susceptibility   Escherichia coli - MIC*    AMPICILLIN >=32 RESISTANT Resistant     CEFAZOLIN 8 SENSITIVE Sensitive     CEFTRIAXONE <=0.25 SENSITIVE Sensitive     CIPROFLOXACIN <=0.25 SENSITIVE Sensitive     GENTAMICIN <=1  SENSITIVE Sensitive     IMIPENEM <=0.25 SENSITIVE Sensitive     NITROFURANTOIN <=16 SENSITIVE Sensitive     TRIMETH/SULFA <=20 SENSITIVE Sensitive     AMPICILLIN/SULBACTAM >=32 RESISTANT Resistant     PIP/TAZO <=4 SENSITIVE Sensitive     * >=100,000 COLONIES/mL ESCHERICHIA COLI  SARS CORONAVIRUS 2 (TAT 6-24 HRS) Nasopharyngeal Nasopharyngeal Swab     Status: None   Collection Time: 08/22/19  6:15 PM   Specimen: Nasopharyngeal Swab  Result Value Ref Range Status   SARS Coronavirus 2 NEGATIVE NEGATIVE Final    Comment: (NOTE) SARS-CoV-2 target nucleic acids are NOT DETECTED. The SARS-CoV-2 RNA is generally detectable in upper and lower respiratory specimens during the acute phase of infection. Negative results do not preclude SARS-CoV-2 infection, do not rule out co-infections with other pathogens, and should not be used as the sole basis for treatment or other patient management decisions. Negative results must be combined with clinical observations, patient history, and epidemiological information. The expected result is Negative. Fact Sheet for Patients: SugarRoll.be Fact Sheet for Healthcare Providers: https://www.woods-mathews.com/ This test is not yet approved or cleared by the [place] FDA and  has been authorized for detection and/or diagnosis of SARS-CoV-2 by FDA under an Emergency Use Authorization (EUA). This EUA will remain  in effect (meaning this test can be used) for the duration of the COVID-19 declaration under Section 56 4(b)(1) of the Act, 21 U.S.C. section 360bbb-3(b)(1), unless the authorization is terminated or revoked sooner. Performed at [facility], [place] [address] [place], [place] [address]     Discharge Instructions     Discharge Instructions    Ambulatory referral to Rheumatology   Complete by: As directed    Concern for underlying inflammatory arthropathy, may need further workup/treatment    Diet - low sodium heart healthy   Complete by: As directed    Discharge instructions   Complete by: As directed    - Continue with Prednisone taper -Losartan decreased 25 mg daily - referral to Rheumatology placed for further evaluation/treatment of inflammatory arthropathy - follow up with outpatient pulmonology regarding COPD   Increase activity slowly   Complete by: As directed      Allergies as of 08/23/2019      Reactions   Tramadol Other (See Comments)   GI UPSET      Medication List    STOP taking these medications   doxycycline 100 MG tablet Commonly known as: VIBRA-TABS   HYDROcodone-homatropine 5-1.5 MG/5ML syrup Commonly known as:  HYCODAN     TAKE these medications   albuterol (2.5 MG/3ML) 0.083% nebulizer solution Commonly known as: PROVENTIL Take 3 mLs (2.5 mg total) by nebulization every 6 (six) hours as needed for wheezing or shortness of breath. What changed: Another medication with the same name was removed. Continue taking this medication, and follow the directions you see here.   budesonide-formoterol 160-4.5 MCG/ACT inhaler Commonly known as: Symbicort Inhale 2 puffs into the lungs 2 (two) times daily.   budesonide-formoterol 160-4.5 MCG/ACT inhaler Commonly known as: Symbicort Inhale 2 puffs into the lungs 2 (two) times daily for 1 day.   buPROPion 150 MG 12 hr tablet Commonly known as: WELLBUTRIN SR Take 150 mg by mouth daily.   CAL MAG ZINC +D3 PO Take 1 tablet by mouth at bedtime.   cholecalciferol 1000 units tablet Commonly known as: VITAMIN D Take 1,000 Units by mouth daily.   diltiazem 360 MG 24 hr capsule Commonly known as: CARDIZEM CD Take 360 mg by mouth daily.   Flutter Devi Use as directed   furosemide 20 MG tablet Commonly known as: LASIX Take 40 mg by mouth daily as needed for fluid.   Garlic 952 MG Tabs Take 1 tablet by mouth daily.   HYDROcodone-acetaminophen 7.5-325 MG tablet Commonly known as: NORCO Take 1 tablet  by mouth every 4 (four) hours. What changed: Another medication with the same name was removed. Continue taking this medication, and follow the directions you see here.   hydrOXYzine 25 MG tablet Commonly known as: ATARAX/VISTARIL Take 25 mg by mouth 3 (three) times daily as needed for anxiety.   losartan 25 MG tablet Commonly known as: COZAAR Take 1 tablet (25 mg total) by mouth daily. Start taking on: August 24, 2019 What changed:   medication strength  how much to take   ondansetron 4 MG tablet Commonly known as: ZOFRAN Take 2-4 mg by mouth every 6 (six) hours as needed for nausea/vomiting.   OXYGEN Inhale 3 L into the lungs. 24/7 DME- [facility]   potassium chloride 10 MEQ tablet Commonly known as: KLOR-CON Take 10 mEq by mouth daily as needed (swelling). When takes lasix   predniSONE 10 MG tablet Commonly known as: DELTASONE Take 3 tablets (30 mg total) by mouth daily with breakfast for 2 days, THEN 2 tablets (20 mg total) daily with breakfast for 3 days, THEN 1 tablet (10 mg total) daily with breakfast for 3 days. Start taking on: August 24, 2019 What changed: See the new instructions.   Spiriva Respimat 2.5 MCG/ACT Aers Generic drug: Tiotropium Bromide Monohydrate INHALE 2 PUFFS INTO THE LUNGS EVERY MORNING What changed:   how much to take  how to take this  when to take this  additional instructions   Spiriva Respimat 2.5 MCG/ACT Aers Generic drug: Tiotropium Bromide Monohydrate Inhale 2 puffs into the lungs daily for 1 day. What changed: Another medication with the same name was changed. Make sure you understand how and when to take each.   spironolactone 25 MG tablet Commonly known as: ALDACTONE Take 1 tablet by mouth 2 (two) times daily.   vitamin C with rose hips 500 MG tablet Take 500 mg by mouth daily.      Contact information for after-discharge care    Destination    [facility] Preferred SNF .   Service: Skilled  Nursing Contact information: [address] [place] [phone]             Allergies  Allergen Reactions  . Tramadol Other (See Comments)    GI UPSET    Time coordinating discharge: Over 30 minutes   SIGNED:   Harold Hedge, D.O. [facility] Pager: [phone]  08/23/2019, 1:20 PM

## 2019-08-23 NOTE — TOC Progression Note (Signed)
Transition of Care [facility]) - Progression Note    Patient Details  Name: KELLIE MURRILL MRN: [id number] Date of Birth: 07-Jan-1946  Transition of Care [facility]) CM/SW Contact  Kyaira Trantham, Juliann Pulse, RN Phone Number: 08/23/2019, 5:37 PM  Clinical Narrative:  Detailed notice of d/c & HINN 12  Given. Await appeal process.    Expected Discharge Plan: Skilled Nursing Facility Barriers to Discharge: No Barriers Identified  Expected Discharge Plan and Services Expected Discharge Plan: [place]         Expected Discharge Date: 08/23/19                                     Social Determinants of Health (SDOH) Interventions    Readmission Risk Interventions No flowsheet data found.

## 2019-08-23 NOTE — TOC Transition Note (Signed)
Transition of Care [facility]) - CM/SW Discharge Note   Patient Details  Name: [name] MRN: [id number] Date of Birth: Jan 01, 1946  Transition of Care [facility]) CM/SW Contact:  Dessa Phi, RN Phone Number: 08/23/2019, 1:13 PM   Clinical Narrative:  [facility] chose [facility] able to accept-waiting on rm bed tel# for report.PTAR once ready.     Final next level of care: Skilled Nursing Facility Barriers to Discharge: No Barriers Identified   Patient Goals and CMS Choice        Discharge Placement PASRR number recieved: 08/22/19            Patient chooses bed at: [facility] Patient to be transferred to facility by: [facility] Name of family member notified: Elige Radon [phone] Patient and family notified of of transfer: 08/23/19  Discharge Plan and Services                                     Social Determinants of Health (SDOH) Interventions     Readmission Risk Interventions No flowsheet data found.

## 2019-08-23 NOTE — TOC Progression Note (Signed)
Transition of Care [facility]) - Progression Note    Patient Details  Name: [name] MRN: [id number] Date of Birth: 1946-03-06  Transition of Care [facility]) CM/SW Contact  Zamora Colton, Juliann Pulse, RN Phone Number: 08/23/2019, 3:09 PM  Clinical Narrative: Damaris Schooner to patient again about  D/c plan & SNF's she now wants to have her dtr Butch Penny involved in the choice of SNF's-the dtr has appealed d/c while she reviews the SNF's for choice. MD notified.      Expected Discharge Plan: Skilled Nursing Facility Barriers to Discharge: No Barriers Identified  Expected Discharge Plan and Services Expected Discharge Plan: [place]         Expected Discharge Date: 08/23/19                                     Social Determinants of Health (SDOH) Interventions    Readmission Risk Interventions No flowsheet data found.

## 2019-08-23 NOTE — TOC Progression Note (Signed)
Transition of Care [facility]) - Progression Note    Patient Details  Name: [name] MRN: [id number] Date of Birth: Jun 07, 1946  Transition of Care [facility]) CM/SW Contact  Bader Stubblefield, Juliann Pulse, RN Phone Number: 08/23/2019, 10:24 AM  Clinical Narrative:    1.2 mi [place] [facility] [place], [place] [address] [phone] Overall rating Much below average 2. 1.4 mi [place] [facility] [place], [place] [address] [phone] Overall rating Below average 3. 2 mi [facility] [place], [place] [address] [phone] Overall rating Much above average 4. 2.3 mi [place], [place] [address] [phone] Overall rating Average Next steps for choosing a nursing home 5. 2.6 [place] [address] [place], [place] [address] [phone] Overall rating Below average 6. 3.1 mi [place] at [place], [place] [address] [phone] Overall rating Below average 7. 3.2 mi [place] [facility] [place], [place] [address] [phone] Overall rating Much above average 8. 3.6 mi [place] [address] [place], [place] [address] [phone] Overall rating Much below average 9. 3.7 mi [facility] [place], [place] [address] [phone] Overall rating Below average 10. 5.4 [facility], [place] [address] [phone] Overall rating Much below average 11. 5.6 mi [facility] [place], [place] [address] [phone] Overall rating Much above average 12. 6.1 mi [facility] [address] [place],  [place] [address] [phone] Overall rating Much above average 13. 7.1 [place] [facility] [place], [place] [address] [phone] Overall rating Much below average 14. 7.2 mi [facility] [address] [place], [place] [address] [phone] Overall rating Average 15. 7.9 [facility] [place],  [address] [phone] Overall rating Above average <Previous _0 Next> Data last updated: August 10, 2019 To explore and download nursing home data,visit the data catalog on CMS.gov Consumer alert Nursing homes that have been cited for potential issues related to abuse have the following icon next to their name:   More information about this, and what to do if you suspect abuse has occurred can be found here  About [place] Nondiscrimination/AccessibilityPrivacy [employer] this [place] website managed and paid for by the [employer] and [employer]. [place], [place] [address]          Expected Discharge Plan and Services                                                 Social Determinants of Health (SDOH) Interventions    Readmission Risk Interventions No flowsheet data found.

## 2019-08-23 NOTE — Progress Notes (Signed)
PROGRESS NOTE    [name]    Code Status: Full Code  TDD:[id number] DOB: 07-29-1945 DOA: 08/18/2019 LOS: 5 days  PCP: Crystal Sacramento, MD CC:  Chief Complaint  Patient presents with  . Arm Pain       Hospital Summary   This is a 74 year old female with history of chronic hypoxic respiratory failure secondary to COPD on 3 L continuously, hypertension, anxiety, chronic lower extremity wounds follows at wound care, lower extremity venous reflux, chronic generalized skin changes who presented to [facility] on 3/4 for generalized weakness and intractable nausea/vomiting and found to have AKI.  ED Course: Vital signs in the ED were notable for the following:Tmax 98.0; HR 70-79; BP 96/50 2-1 100/50 RR 13-22; and SPO2 94 to 98% on 4 L nasal cannula.  Labs were notable for the following:CMP notable for the following: Sodium 137, potassium 5.3, chloride 98, bicarbonate 20, anion gap 19, BUN 87, creatinine 3.56 relative to most recent prior creatinine value of 0.44 in December 2019, albumin 2.2, alkaline phosphatase 282, AST 183, ALT 124, total bilirubin 1.8.Of note, per chart review, it appears that the most recent previous liver enzymes were performed back in December 2019, at which time they were found to be within normal limits. CBC notable for white blood cell count 15,600 with 77% neutrophils, hemoglobin 9.4 with MCV 103, relative to most recent prior hemoglobin value of 12.9 in December 2019 platelets 195.  Urinalysis was notable for a cloudy specimen with 21-50 white blood cells, many bacteria, moderate leukocyteesterace,positive hyaline casts, and no evidence of squamous epithelial cells. Blood cultures x2 were collected prior to initiation of any antibiotics. Nasopharyngeal COVID-19 PCR swab was obtained, with result currently pending.  Chest x-ray showed no evidence of acute cardiopulmonary process, including no evidence of infiltrate, edema, effusion, or pneumothorax. Renal  ultrasound was ordered and performed in the ED, with result currently pending.  While in the ED, the following were administered:Lactated Ringer bolus x1 L, albuterol inhaler timesx 6 puffs.Subsequently, the patient was admitted to the stepdown unit for further evaluation and management presenting acute renal failure complicated by hyperkalemia as well as for sepsis due to underlying urinary tract infection.  3/5: Renal function initially improved with LR but then worsened, nephrology consulted  3/6: renal function improved. Nephro signed off. Patient transferred to floor  3/7: Started on prednisone taper for right hand inflammatory arthropathy, uric acid 11+ and elevated inflammatory markers.  Right hand x-ray with chondrocalcinosis 3/8: Difficulty opening right hand persistently, hand surgery consulted for further recommendations who recommended PT and exercises  A & P   Principal Problem:   ARF (acute renal failure) ([place]) Active Problems:   COPD GOLD III   Chronic respiratory failure with hypoxia (HCC)   Hyperkalemia   Transaminitis   Generalized weakness   Leukocytosis   Anemia   Acute renal failure (ARF) (HCC)   Severe sepsis (HCC)   Acute lower UTI   Pressure injury of skin   Protein-calorie malnutrition, severe   Inflammatory arthropathy   1. Generalized weakness likely secondary to hypotension and AKI a. PT/OT -SNF at DC b. Orthostatics negative 2. AKI likely secondary to hemodynamic changes, poor p.o. intake, intractable nausea/vomiting Resolved with IV fluids and holding ARB 3. Hypotension of unknown etiology a. resolved with IV fluid boluses and holding home antihypertensives.  b. Will restart losartan at decreased dosing 4. Acute on chronic hypoxic respiratory failure secondary to COPD exacerbation a. On chronic 3 L/min nasal cannula,  still at 4 L/min at discharge, continue incentive spirometry and ambulation at discharge b. Follows with Dr. Melvyn [name]  outpatient c. SPO2 goal >88% d. Continue home Symbicort and tiotropium and as needed albuterol inhaler e. Continue prednisonetaper 5. Right handpain, likely inflammatory arthropathy, gout VS. Pseudogout VS. Less Likely RA a. ESR, CRP, ferritin elevated,Uric acid: 11.6, RF elevated b. Right hand x-ray with chondrocalcinosis c. Anti CCP negative d. Continue prednisone taper e. Start allopurinol outpatient f. Consulted Ortho since she is not able to open her hand - also believes inflammatory arthropathy and encouraged ROM exercises and continued medical therapy g. Will restart losartan at lower dose which can help reduce uric acid 6. Intractable nausea/vomiting probably from AKI and electrolyte normalities resolved a. Phenergan for nausea 7. Hyperkalemia Resolved 8. Hypomagnesemiaresolved 9. Transaminitis likely hepatic steatosis with some biliary sludge a. Acute hepatitis panel unremarkable b. RUQ ultrasound with layering sludge/small stones without evidence of cholecystitis c. Continue to monitor, if she becomes symptomatic, consider GI consult otherwise outpatient follow-up 10. SIRS secondary to E. coli UTISIRS resolved a. Completed ceftriaxone x3 days 11. Hypertension a. Losartan at reduced dosing as above, holding HCTZ 12. Acute on chronic macrocytic anemia, unknown etiology a. Ferritin high, iron studies not calculated x2 b. Likely anemia of chronic disease c. Hb improved and stable 13. Generalized anxiety disorder a. Continue home meds 14. Alcohol use a. Not require CIWA protocol 15. Chronic lower extremity wounds with history of venous reflux a. Wound care   DVT prophylaxis: Heparin Family Communication: No family at bedside Disposition Plan:   Patient came from:   Home, lives alone                                                                                          Anticipated d/c place: SNF  Barriers to d/c: initially discharged on 3/9 however patient  declined SNF after order placed. She is to continue looking for SNFs. Discharge order still in.   Pressure injury documentation   Pressure Injury 08/19/19 Sacrum Mid Stage 2 -  Partial thickness loss of dermis presenting as a shallow open injury with a red, pink wound bed without slough. (Active)  08/19/19 0200  Location: Sacrum  Location Orientation: Mid  Staging: Stage 2 -  Partial thickness loss of dermis presenting as a shallow open injury with a red, pink wound bed without slough.  Wound Description (Comments):   Present on Admission: Yes    Consultants  Nephrology  Procedures  None  Antibiotics   Anti-infectives (From admission, onward)   Start     Dose/Rate Route Frequency Ordered Stop   08/18/19 2315  cefTRIAXone (ROCEPHIN) 1 g in sodium chloride 0.9 % 100 mL IVPB  Status:  Discontinued     1 g 200 mL/hr over 30 Minutes Intravenous Every 24 hours 08/18/19 2315 08/21/19 1523        Subjective  She is a poor historian  Seen and examined resting comfortably at bedside no acute distress.  States she still feels generalized weakness and reports no significant improvement despite improved labs.  States she has these chronic issues.  Denies any chest pain,  nausea, vomiting, diarrhea, or any other complaints.    Objective   Vitals:   08/22/19 2005 08/23/19 0550 08/23/19 0813 08/23/19 1411  BP:  110/63  107/66  Pulse: 87 86  80  Resp: _0 Temp: 98.2 F (36.8 C) 98.4 F (36.9 C)  98.1 F (36.7 C)  TempSrc: Oral Oral  Oral  SpO2: 99% 97% 97% 97%  Weight:      Height:        Intake/Output Summary (Last 24 hours) at 08/23/2019 1537 Last data filed at 08/23/2019 1000 Gross per 24 hour  Intake 243 ml  Output 250 ml  Net -7 ml   Filed Weights   08/19/19 0200 08/20/19 0500 08/21/19 0500  Weight: 51.9 kg 51.2 kg 51.4 kg    Examination:  Physical Exam Vitals and nursing note reviewed.  Constitutional:      Appearance: Normal appearance.  HENT:      Head: Normocephalic and atraumatic.  Eyes:     Conjunctiva/sclera: Conjunctivae normal.  Cardiovascular:     Rate and Rhythm: Normal rate and regular rhythm.  Pulmonary:     Effort: Pulmonary effort is normal.     Breath sounds: Normal breath sounds.  Abdominal:     General: Abdomen is flat.     Palpations: Abdomen is soft.  Musculoskeletal:        General: No tenderness.     Comments: Improved Right hand erythema and ROM  Skin:    Coloration: Skin is not jaundiced or pale.  Neurological:     Mental Status: She is alert. Mental status is at baseline.  Psychiatric:        Mood and Affect: Mood normal.        Behavior: Behavior normal.       Data Reviewed: I have personally reviewed following labs and imaging studies  CBC: Recent Labs  Lab 08/18/19 1919 08/18/19 2200 08/19/19 0239 08/19/19 0239 08/19/19 1520 08/20/19 0222 08/21/19 0915 08/22/19 0506 08/23/19 0808  WBC 15.6*   < > 14.5*   < > 12.9* 10.7* 9.9 10.2 11.4*  NEUTROABS 12.1*  --  11.8*  --   --   --   --   --   --   HGB 9.4*   < > 8.6*   < > 8.7* 8.7* 9.6* 8.2* 9.0*  HCT 29.0*   < > 26.7*   < > 25.8* 26.6* 30.6* 25.6* 27.8*  MCV 102.8*   < > 100.4*   < > 100.0 100.4* 102.3* 102.8* 102.6*  PLT PLATELET CLUMPS NOTED ON SMEAR, COUNT APPEARS DECREASED   < > 171   < > 164 143* 153 145* 151   < > = values in this interval not displayed.   Basic Metabolic Panel: Recent Labs  Lab 08/19/19 0239 08/19/19 0239 08/19/19 1520 08/20/19 0222 08/21/19 0915 08/22/19 0506 08/23/19 0808  NA 137   < > 142 143 143 141 143  K 5.0   < > 4.5 4.2 4.4 4.2 4.3  CL 101   < > 104 108 105 106 104  CO2 21*   < > _1 32  GLUCOSE 102*   < > 117* 124* 169* 139* 109*  BUN 86*   < > 73* 55* 41* 36* 31*  CREATININE 2.98*   < > 1.78* 1.11* 0.71 0.64 0.58  CALCIUM 8.5*   < > 8.2* 8.3* 8.1* 7.8* 8.5*  MG 1.6*  --  2.6* 2.2  --  1.6* 1.9  PHOS 4.5  --  2.9  --   --   --   --    < > = values in this interval not displayed.    GFR: Estimated Creatinine Clearance: 49.5 mL/min (by C-G formula based on SCr of 0.58 mg/dL). Liver Function Tests: Recent Labs  Lab 08/18/19 1919 08/19/19 0239 08/19/19 1520  AST 183* 130* 102*  ALT 124* 109* 95*  ALKPHOS 282* 259* 273*  BILITOT 1.8* 1.6* 1.6*  PROT 5.4* 5.0* 4.8*  ALBUMIN 2.2* 2.0* 2.0*   No results for input(s): LIPASE, AMYLASE in the last 168 hours. Recent Labs  Lab 08/19/19 0239  AMMONIA 33   Coagulation Profile: Recent Labs  Lab 08/18/19 1919 08/19/19 0239  INR 1.0 1.1   Cardiac Enzymes: Recent Labs  Lab 08/19/19 1117  CKTOTAL 47   BNP (last 3 results) No results for input(s): PROBNP in the last 8760 hours. HbA1C: No results for input(s): HGBA1C in the last 72 hours. CBG: No results for input(s): GLUCAP in the last 168 hours. Lipid Profile: No results for input(s): CHOL, HDL, LDLCALC, TRIG, CHOLHDL, LDLDIRECT in the last 72 hours. Thyroid Function Tests: No results for input(s): TSH, T4TOTAL, FREET4, T3FREE, THYROIDAB in the last 72 hours. Anemia Panel: No results for input(s): VITAMINB12, FOLATE, FERRITIN, TIBC, IRON, RETICCTPCT in the last 72 hours. Sepsis Labs: Recent Labs  Lab 08/19/19 0120 08/19/19 0239  LATICACIDVEN 4.7* 1.0    Recent Results (from the past 240 hour(s))  SARS CORONAVIRUS 2 (TAT 6-24 HRS) Nasopharyngeal Nasopharyngeal Swab     Status: None   Collection Time: 08/18/19  7:56 PM   Specimen: Nasopharyngeal Swab  Result Value Ref Range Status   SARS Coronavirus 2 NEGATIVE NEGATIVE Final    Comment: (NOTE) SARS-CoV-2 target nucleic acids are NOT DETECTED. The SARS-CoV-2 RNA is generally detectable in upper and lower respiratory specimens during the acute phase of infection. Negative results do not preclude SARS-CoV-2 infection, do not rule out co-infections with other pathogens, and should not be used as the sole basis for treatment or other patient management decisions. Negative results must be combined  with clinical observations, patient history, and epidemiological information. The expected result is Negative. Fact Sheet for Patients: SugarRoll.be Fact Sheet for Healthcare Providers: https://www.woods-mathews.com/ This test is not yet approved or cleared by the [place] FDA and  has been authorized for detection and/or diagnosis of SARS-CoV-2 by FDA under an Emergency Use Authorization (EUA). This EUA will remain  in effect (meaning this test can be used) for the duration of the COVID-19 declaration under Section 56 4(b)(1) of the Act, 21 U.S.C. section 360bbb-3(b)(1), unless the authorization is terminated or revoked sooner. Performed at [facility], [place] [address] [place], [place] [address]   Culture, blood (routine x 2)     Status: None   Collection Time: 08/18/19  9:52 PM   Specimen: BLOOD  Result Value Ref Range Status   Specimen Description   Final    BLOOD RIGHT ANTECUBITAL Performed at [place] [address], [place], [place] [address]    Special Requests   Final    BOTTLES DRAWN AEROBIC AND ANAEROBIC Blood Culture adequate volume Performed at [place] [address] [place], [place] [address]    Culture   Final    NO GROWTH 5 DAYS Performed at [facility], [place] [address], [place], [place] [address]    Report Status 08/23/2019 FINAL  Final  Culture, blood (routine x 2)     Status: None   Collection Time: 08/18/19 10:00 PM   Specimen: BLOOD  Result Value Ref Range Status   Specimen Description   Final    BLOOD LEFT ANTECUBITAL Performed at [place] [address] [place], [place] [address]    Special Requests   Final    BOTTLES DRAWN AEROBIC AND ANAEROBIC Blood Culture results may not be optimal due to an inadequate volume of blood received in culture bottles Performed at [place] [address]  [place], [place] [address]    Culture   Final    NO GROWTH 5 DAYS Performed at [facility], [place] [address], [place], [place] [address]    Report Status 08/23/2019 FINAL  Final  MRSA PCR Screening     Status: Abnormal   Collection Time: 08/19/19  1:36 AM   Specimen: Nasal Mucosa; Nasopharyngeal  Result Value Ref Range Status   MRSA by PCR POSITIVE (A) NEGATIVE Final    Comment:        The GeneXpert MRSA Assay (FDA approved for NASAL specimens only), is one component of a comprehensive MRSA colonization surveillance program. It is not intended to diagnose MRSA infection nor to guide or monitor treatment for MRSA infections. RESULT CALLED TO, READ BACK BY AND VERIFIED WITH: KOONTEZ, A. @ [phone] 08/19/2019 PERRY, J. Performed at [facility], [place] [address] [place], [place] [address]   Culture, Urine     Status: Abnormal   Collection Time: 08/19/19  5:00 PM   Specimen: Urine, Random  Result Value Ref Range Status   Specimen Description   Final    URINE, RANDOM Performed at [place] [address] [place], [place] [address]    Special Requests   Final    NONE Performed at [facility], [place] [address], [place], [place] [address]    Culture >=100,000 COLONIES/mL ESCHERICHIA COLI (A)  Final   Report Status 08/21/2019 FINAL  Final   Organism ID, Bacteria ESCHERICHIA COLI (A)  Final      Susceptibility   Escherichia coli - MIC*    AMPICILLIN >=32 RESISTANT Resistant     CEFAZOLIN 8 SENSITIVE Sensitive     CEFTRIAXONE <=0.25 SENSITIVE Sensitive     CIPROFLOXACIN <=0.25 SENSITIVE Sensitive     GENTAMICIN <=1 SENSITIVE Sensitive     IMIPENEM <=0.25 SENSITIVE Sensitive     NITROFURANTOIN <=16 SENSITIVE Sensitive     TRIMETH/SULFA <=20 SENSITIVE Sensitive     AMPICILLIN/SULBACTAM >=32 RESISTANT Resistant     PIP/TAZO <=4 SENSITIVE Sensitive     * >=100,000 COLONIES/mL ESCHERICHIA COLI  SARS CORONAVIRUS 2 (TAT 6-24  HRS) Nasopharyngeal Nasopharyngeal Swab     Status: None   Collection Time: 08/22/19  6:15 PM   Specimen: Nasopharyngeal Swab  Result Value Ref Range Status   SARS Coronavirus 2 NEGATIVE NEGATIVE Final    Comment: (NOTE) SARS-CoV-2 target nucleic acids are NOT DETECTED. The SARS-CoV-2 RNA is generally detectable in upper and lower respiratory specimens during the acute phase of infection. Negative results do not preclude SARS-CoV-2 infection, do not rule out co-infections with other pathogens, and should not be used as the sole basis for treatment or other patient management decisions. Negative results must be combined with clinical observations, patient history, and epidemiological information. The expected result is Negative. Fact Sheet for Patients: SugarRoll.be Fact Sheet for Healthcare Providers: https://www.woods-mathews.com/ This test  is not yet approved or cleared by the [place] and  has been authorized for detection and/or diagnosis of SARS-CoV-2 by FDA under an Emergency Use Authorization (EUA). This EUA will remain  in effect (meaning this test can be used) for the duration of the COVID-19 declaration under Section 56 4(b)(1) of the Act, 21 U.S.C. section 360bbb-3(b)(1), unless the authorization is terminated or revoked sooner. Performed at [facility], [place] [address], [place],  [address]          Radiology Studies: No results found.      Scheduled Meds: . buPROPion  150 mg Oral Daily  . diltiazem  240 mg Oral Daily  . feeding supplement (ENSURE ENLIVE)  237 mL Oral BID BM  . folic acid  1 mg Oral Daily  . furosemide  20 mg Oral Daily  . heparin  5,000 Units Subcutaneous Q8H  . losartan  25 mg Oral Daily  . mouth rinse  15 mL Mouth Rinse BID  . mometasone-formoterol  2 puff Inhalation BID  . multivitamin with minerals  1 tablet Oral Daily  . mupirocin ointment  1 application Nasal BID  .  nutrition supplement (JUVEN)  1 packet Oral BID BM  . nystatin  5 mL Oral QID  . predniSONE  30 mg Oral Q breakfast   Followed by  . [START ON 08/26/2019] predniSONE  20 mg Oral Q breakfast   Followed by  . [START ON 08/29/2019] predniSONE  10 mg Oral Q breakfast  . sodium chloride flush  3 mL Intravenous Q12H  . thiamine  100 mg Oral Daily   Or  . thiamine  100 mg Intravenous Daily  . umeclidinium bromide  1 puff Inhalation q morning - 10a   Continuous Infusions: . sodium chloride 10 mL/hr at 08/22/19 1306     Time spent: 33 minutes with over 50% of the time coordinating the patient's care    Harold Hedge, DO [facility] Pager [phone]  Call night coverage person covering after 7pm

## 2019-08-23 NOTE — TOC Transition Note (Signed)
Transition of Care [facility]) - CM/SW Discharge Note   Patient Details  Name: [name] MRN: [id number] Date of Birth: 19-Oct-1945  Transition of Care [facility]) CM/SW Contact:  Dessa Phi, RN Phone Number: 08/23/2019, 2:33 PM   Clinical Narrative:  PTAR called.going to [facility] [id number],nurse call report [phone]. No further CM needs.     Final next level of care: Skilled Nursing Facility Barriers to Discharge: No Barriers Identified   Patient Goals and CMS Choice        Discharge Placement PASRR number recieved: 08/22/19            Patient chooses bed at: [facility] Patient to be transferred to facility by: [place] Name of family member notified: Elige Radon [phone] Patient and family notified of of transfer: 08/23/19  Discharge Plan and Services                                     Social Determinants of Health (SDOH) Interventions     Readmission Risk Interventions No flowsheet data found.

## 2019-08-23 NOTE — TOC Progression Note (Signed)
Transition of Care [facility]) - Progression Note    Patient Details  Name: [name] MRN: [id number] Date of Birth: May 31, 1946  [facility]) CM/SW Contact  Cormick Moss, Juliann Pulse, RN Phone Number: 08/23/2019, 3:32 PM  Clinical Narrative:PTAR cancelled. Patient's dtr per patient agree to appeal d/c-will await outcome.Dtr wants to be involved with choosing a facility with dtr-[name].       Expected Discharge Plan: Skilled Nursing Facility Barriers to Discharge: No Barriers Identified  Expected Discharge Plan and Services Expected Discharge Plan: [place]         Expected Discharge Date: 08/23/19                                     Social Determinants of Health (SDOH) Interventions    Readmission Risk Interventions No flowsheet data found.

## 2019-08-24 DIAGNOSIS — A419 Sepsis, unspecified organism: Secondary | ICD-10-CM

## 2019-08-24 DIAGNOSIS — J449 Chronic obstructive pulmonary disease, unspecified: Secondary | ICD-10-CM

## 2019-08-24 DIAGNOSIS — R6520 Severe sepsis without septic shock: Secondary | ICD-10-CM

## 2019-08-24 DIAGNOSIS — E43 Unspecified severe protein-calorie malnutrition: Secondary | ICD-10-CM

## 2019-08-24 MED ORDER — CIPROFLOXACIN HCL 500 MG PO TABS: 500.0000 mg | ORAL_TABLET | Freq: Two times a day (BID) | ORAL | 0 refills | Status: AC

## 2019-08-24 MED ORDER — DOXYCYCLINE HYCLATE 100 MG PO TABS: 100.0000 mg | ORAL_TABLET | Freq: Two times a day (BID) | ORAL | 0 refills | Status: AC

## 2019-08-24 NOTE — TOC Transition Note (Signed)
Transition of Care [facility]) - CM/SW Discharge Note   Patient Details  Name: [name] MRN: [id number] Date of Birth: Nov 03, 1945  Transition of Care [facility]) CM/SW Contact:  Dessa Phi, RN Phone Number: 08/24/2019, 12:48 PM   Clinical Narrative: d/c to [facility] called for 2p pick up. Going to rm [id number],tel# nurse call report [phone]      Final next level of care: [place] Barriers to Discharge: No Barriers Identified   Patient Goals and CMS Choice        Discharge Placement PASRR number recieved: 08/22/19            Patient chooses bed at: [facility] Patient to be transferred to facility by: [place] Name of family member notified: Elige Radon [phone] Patient and family notified of of transfer: 08/23/19  Discharge Plan and Services                                     Social Determinants of Health (SDOH) Interventions     Readmission Risk Interventions No flowsheet data found.

## 2019-08-24 NOTE — Discharge Summary (Addendum)
Physician Discharge Summary  [name] HQI:[id number] DOB: [birth year] DOA: 08/18/2019  PCP: Christain Sacramento, MD  Admit date: 08/18/2019 Discharge date: 08/24/2019  Admitted From: Home  Discharge disposition: Skilled nursing facility   Recommendations for Outpatient Follow-Up:   Follow up with your primary care provider at the SNF in 3-5 days Check CBC, BMP, Mg in the next visit Would recommend follow-up with rheumatology referral for inflammatory arthropathy, gout versus pseudogout possible further work-up in the future preferably in 2-3 weeks (ambulatory referral has been made Decrease losartan to 25 mg daily as BP stable and patient had AKI.  Losartan to help with elevated uric acid Follow-up with pulmonologist outpatient, Dr. Melvyn Novas in 1 to 2 weeks for regular COPD follow-up Follow-up with dermatology in 1 to 2 weeks.  Patient did have recent follow-up with [place] [facility] who recommended antibiotics.  Patient has been recommended doxycycline p.o. twice daily for 30 days and ciprofloxacin 500 mg p.o. twice daily for 10 days based on the culture and sensitivity report.     Discharge Diagnosis:   Principal Problem:   ARF (acute renal failure) (HCC) Active Problems:   COPD GOLD III   Chronic respiratory failure with hypoxia (HCC)   Hyperkalemia   Transaminitis   Generalized weakness   Leukocytosis   Anemia   Acute renal failure (ARF) (HCC)   Severe sepsis (HCC)   Acute lower UTI   Pressure injury of skin   Protein-calorie malnutrition, severe   Inflammatory arthropathy   Discharge Condition: Improved.  Diet recommendation: Low sodium, heart healthy.   Wound care: Continue dressing of the wounds on the extremities.  Code status: Full.   History of Present Illness:   This is a 74 year old female with history of chronic hypoxic respiratory failure secondary to COPD on 3 L continuously, hypertension, anxiety, chronic lower extremity wounds follows at wound care,  lower extremity venous dermatitis, chronic generalized skin changes who [facility] for generalized weakness and intractable nausea/vomiting and found to have AKI.  Vital signs in the ED were notable for the following: Tmax 98.0; HR 70-79; BP 96/50 2-1 100/50 RR 13-22; and SPO2 94 to 98% on 4 L nasal cannula.    CMP was notable for the following: Sodium 137, potassium 5.3, chloride 98, bicarbonate 20, anion gap 19, BUN 87, creatinine 3.56 relative to most recent prior creatinine value of 0.44 in December 2019, albumin 2.2, alkaline phosphatase 282, AST 183, ALT 124, total bilirubin 1.8.  Of note, per chart review, it appears that the most recent previous liver enzymes were performed back in December 2019, at which time they were found to be within normal limits.  CBC notable for white blood cell count 15,600 with 77% neutrophils, hemoglobin 9.4 with MCV 103, relative to most recent prior hemoglobin value of 12.9 in December 2019 platelets 195.Urinalysis was notable for a cloudy specimen with 21-50 white blood cells, many bacteria, moderate leukocyte esterace, positive hyaline casts, and no evidence of squamous epithelial cells.  Blood cultures x2 were collected prior to initiation of any antibiotics.  Chest x-ray showed no evidence of acute cardiopulmonary process, including no evidence of infiltrate, edema, effusion, or pneumothorax.  Renal ultrasound was ordered and performed in the ED without acute findings.  Subsequently, the patient was admitted to the stepdown unit for further evaluation and management presenting acute renal failure complicated by hyperkalemia as well as for sepsis due to underlying urinary tract infection. On 3/5: Renal function initially improved with  LR but then worsened, nephrology consulted. 3/6: renal function improved. Nephro signed off. Patient transferred to floor . 3/7: Started on prednisone taper for right hand inflammatory arthropathy, uric acid 11+ and elevated  inflammatory markers.  Right hand x-ray with chondrocalcinosis. 3/8: Difficulty opening right hand persistently, hand surgery consulted for further recommendations who recommended PT and exercises   Hospital Course:   Following conditions were addressed during hospitalization as listed below,  Generalized weakness likely secondary to hypotension and AKI, Improving.  Physical therapy for physical therapy recommended skilled nursing facility placement on discharge.  Orthostatics were negative.  AKI likely secondary to hypotension, poor p.o. intake, intractable nausea/vomiting Resolved with IV fluids   Acute on chronic hypoxic respiratory failure secondary to COPD exacerbation. On chronic 3 L/min nasal cannula, continue incentive spirometry and ambulation at discharge.  Patient follows with Dr. Melvyn Novas outpatient. SPO2 goal >88%. Continue home Symbicort and tiotropium and as needed albuterol inhaler. Continue prednisone taper  Right hand pain, likely inflammatory arthropathy, gout VS. Pseudogout VS. Less Likely RA. ESR, CRP, ferritin elevated, Uric acid: 11.6, RF elevated. Right hand x-ray with chondrocalcinosis. Anti CCP negative. Continue prednisone taper.Start allopurinol outpatient. Consulted Ortho since she is not able to open her hand - also believes inflammatory arthropathy and encouraged ROM exercises and continued medical therapy.on losartan at lower dose which can help reduce uric acid  Intractable nausea/vomiting probably from AKI and electrolyte normalities resolved  Hyperkalemia Resolved  Hypomagnesemia resolved  Elevated LFTs  likely hepatic steatosis with some biliary sludge Acute hepatitis panel unremarkable, RUQ ultrasound with layering sludge/small stones without evidence of cholecystitis. Continue to monitor, if she becomes symptomatic, consider GI consult   Sepsis secondary to E. coli UTI, ruled in.  Completed ceftriaxone x3 days  Essential hypertension Losartan at reduced  dosing as above, discontinue HCTZ  Generalized anxiety disorder. Continue home meds  History of alcohol use.  Has not required CIWA protocol  Chronic lower extremity/upper extremity wounds with history of venous reflux/sacral ulceration. Continue wound care.  Patient was recently seen by dermatology and was recommended doxycycline for 30 days and ciprofloxacin for 10 days on discharge for staph and pseudomonal skin infection..  Received a fax with culture and sensitivity and recommendations.   Disposition.  At this time, patient is stable for disposition to skilled nursing facility.  I spoke with the patient's daughter on the phone and updated her about the clinical condition of the patient and the plan for follow-up as outpatient.  Medical Consultants:   Nephrology Ortho   Procedures:    Wound dressing Subjective:   Today, patient complains of some shortness of breath but at her baseline oxygen requirement.  Poor historian.  Discharge Exam:   Vitals:   08/24/19 0827 08/24/19 1044  BP:  114/65  Pulse:    Resp:    Temp:    SpO2: 91%    Vitals:   08/23/19 2120 08/24/19 0500 08/24/19 0827 08/24/19 1044  BP: 114/65   114/65  Pulse: 82     Resp: 18     Temp: 98.7 F (37.1 C)     TempSrc:      SpO2: 98%  91%   Weight:  50.8 kg    Height:        General: Alert awake, communicative not in obvious distress HENT: pupils equally reacting to light,  No scleral pallor or icterus noted.  Pressure ulceration.  Mucosa is moist.  Chest:  Clear breath sounds.  Diminished breath sounds bilaterally. No  crackles or wheezes.  CVS: S1 &S2 heard. No murmur.  Regular rate and rhythm. Abdomen: Soft, nontender, nondistended.  Bowel sounds are heard.   Extremities: No cyanosis, clubbing or edema.  Peripheral pulses are palpable. Psych: Alert, awake and communicative from normal mood CNS:  No cranial nerve deficits.  Power equal in all extremities.   Skin:  Sacral pressure ulceration stage  II present on admission.  Bilateral lower extremity the venous stasis, upper extremity chronic wounds.  The results of significant diagnostics from this hospitalization (including imaging, microbiology, ancillary and laboratory) are listed below for reference.     Diagnostic Studies:   DG Chest 2 View  Result Date: 08/18/2019 CLINICAL DATA:  Increasing weakness EXAM: CHEST - 2 VIEW COMPARISON:  05/15/2018 FINDINGS: Cardiac shadow is stable. Mild aortic calcifications are seen. The lungs are hyperinflated consistent with COPD. Mild scarring is noted in the bases bilaterally. No focal infiltrate is seen. No sizable effusion is noted. IMPRESSION: COPD. Bibasilar scarring. Electronically Signed   By: Inez Catalina M.D.   On: 08/18/2019 19:49   US Renal  Result Date: 08/18/2019 CLINICAL DATA:  New renal failure. EXAM: RENAL / URINARY TRACT ULTRASOUND COMPLETE COMPARISON:  None. FINDINGS: Right Kidney: Renal measurements: 12.6 x 5.6 x 5.1 cm = volume: 189 mL. Normal echogenicity. No mass or hydronephrosis. Left Kidney: Renal measurements: 10.9 x 5.2 x 6.4 cm = volume: 190 mL. Normal echogenicity. A 9 mm midpole simple cortical cyst and 18 mm upper pole bilobed likely septated cyst. Bladder: Mostly decompressed urinary bladder with bilateral ureteral jet streams and no apparent abnormality. Other: Fatty infiltration of the hepatic parenchyma. None. IMPRESSION: No right or left hydronephrosis. Normal sizes of both kidneys. Small left renal simple and complicated cysts. No apparent abnormality of the mostly decompressed urinary bladder. Fatty infiltration of the hepatic parenchyma. Electronically Signed   By: Revonda Humphrey   On: 08/18/2019 23:10   US Abdomen Limited RUQ  Result Date: 08/19/2019 CLINICAL DATA:  Transaminitis EXAM: ULTRASOUND ABDOMEN LIMITED RIGHT UPPER QUADRANT COMPARISON:  None. FINDINGS: Gallbladder: Layering sludge or small stones are seen layering within the gallbladder. No gallbladder wall  thickening. No pericholecystic fluid. No sonographic Murphy sign noted by sonographer. Common bile duct: Diameter: 4.8 mm Liver: Increased echotexture seen throughout. No focal abnormality or biliary ductal dilatation. Portal vein is patent on color Doppler imaging with normal direction of blood flow towards the liver. Other: None. IMPRESSION: Layering sludge/small stones.  No evidence of cholecystitis. Hepatic steatosis Electronically Signed   By: Prudencio Pair M.D.   On: 08/19/2019 06:04     Labs:   Basic Metabolic Panel: Recent Labs  Lab 08/19/19 0239 08/19/19 0239 08/19/19 1520 08/19/19 1520 08/20/19 0222 08/20/19 0222 08/21/19 0915 08/21/19 0915 08/22/19 0506 08/23/19 0808  NA 137   < > 142  --  143  --  143  --  141 143  K 5.0   < > 4.5   < > 4.2   < > 4.4   < > 4.2 4.3  CL 101   < > 104  --  108  --  105  --  106 104  CO2 21*   < > 26  --  25  --  26  --  28 32  GLUCOSE 102*   < > 117*  --  124*  --  169*  --  139* 109*  BUN 86*   < > 73*  --  55*  --  41*  --  36* 31*  CREATININE 2.98*   < > 1.78*  --  1.11*  --  0.71  --  0.64 0.58  CALCIUM 8.5*   < > 8.2*  --  8.3*  --  8.1*  --  7.8* 8.5*  MG 1.6*  --  2.6*  --  2.2  --   --   --  1.6* 1.9  PHOS 4.5  --  2.9  --   --   --   --   --   --   --    < > = values in this interval not displayed.   GFR Estimated Creatinine Clearance: 49.5 mL/min (by C-G formula based on SCr of 0.58 mg/dL). Liver Function Tests: Recent Labs  Lab 08/18/19 1919 08/19/19 0239 08/19/19 1520  AST 183* 130* 102*  ALT 124* 109* 95*  ALKPHOS 282* 259* 273*  BILITOT 1.8* 1.6* 1.6*  PROT 5.4* 5.0* 4.8*  ALBUMIN 2.2* 2.0* 2.0*   No results for input(s): LIPASE, AMYLASE in the last 168 hours. Recent Labs  Lab 08/19/19 0239  AMMONIA 33   Coagulation profile Recent Labs  Lab 08/18/19 1919 08/19/19 0239  INR 1.0 1.1    CBC: Recent Labs  Lab 08/18/19 1919 08/18/19 2200 08/19/19 0239 08/19/19 0239 08/19/19 1520 08/20/19 0222  08/21/19 0915 08/22/19 0506 08/23/19 0808  WBC 15.6*   < > 14.5*   < > 12.9* 10.7* 9.9 10.2 11.4*  NEUTROABS 12.1*  --  11.8*  --   --   --   --   --   --   HGB 9.4*   < > 8.6*   < > 8.7* 8.7* 9.6* 8.2* 9.0*  HCT 29.0*   < > 26.7*   < > 25.8* 26.6* 30.6* 25.6* 27.8*  MCV 102.8*   < > 100.4*   < > 100.0 100.4* 102.3* 102.8* 102.6*  PLT PLATELET CLUMPS NOTED ON SMEAR, COUNT APPEARS DECREASED   < > 171   < > 164 143* 153 145* 151   < > = values in this interval not displayed.   Cardiac Enzymes: Recent Labs  Lab 08/19/19 1117  CKTOTAL 47   BNP: Invalid input(s): POCBNP CBG: No results for input(s): GLUCAP in the last 168 hours. D-Dimer No results for input(s): DDIMER in the last 72 hours. Hgb A1c No results for input(s): HGBA1C in the last 72 hours. Lipid Profile No results for input(s): CHOL, HDL, LDLCALC, TRIG, CHOLHDL, LDLDIRECT in the last 72 hours. Thyroid function studies No results for input(s): TSH, T4TOTAL, T3FREE, THYROIDAB in the last 72 hours.  Invalid input(s): FREET3 Anemia work up No results for input(s): VITAMINB12, FOLATE, FERRITIN, TIBC, IRON, RETICCTPCT in the last 72 hours. Microbiology Recent Results (from the past 240 hour(s))  SARS CORONAVIRUS 2 (TAT 6-24 HRS) Nasopharyngeal Nasopharyngeal Swab     Status: None   Collection Time: 08/18/19  7:56 PM   Specimen: Nasopharyngeal Swab  Result Value Ref Range Status   SARS Coronavirus 2 NEGATIVE NEGATIVE Final    Comment: (NOTE) SARS-CoV-2 target nucleic acids are NOT DETECTED. The SARS-CoV-2 RNA is generally detectable in upper and lower respiratory specimens during the acute phase of infection. Negative results do not preclude SARS-CoV-2 infection, do not rule out co-infections with other pathogens, and should not be used as the sole basis for treatment or other patient management decisions. Negative results must be combined with clinical observations, patient history, and epidemiological information.  The expected result is Negative. Fact Sheet for  Patients: SugarRoll.be Fact Sheet for Healthcare Providers: https://www.woods-mathews.com/ This test is not yet approved or cleared by the [place] FDA and  has been authorized for detection and/or diagnosis of SARS-CoV-2 by FDA under an Emergency Use Authorization (EUA). This EUA will remain  in effect (meaning this test can be used) for the duration of the COVID-19 declaration under Section 56 4(b)(1) of the Act, 21 U.S.C. section 360bbb-3(b)(1), unless the authorization is terminated or revoked sooner. Performed at [facility], [place] [address], [place], [place] [address]   Culture, blood (routine x 2)     Status: None   Collection Time: 08/18/19  9:52 PM   Specimen: BLOOD  Result Value Ref Range Status   Specimen Description   Final    BLOOD RIGHT ANTECUBITAL Performed at [place] [address] [place], [place] [address]    Special Requests   Final    BOTTLES DRAWN AEROBIC AND ANAEROBIC Blood Culture adequate volume Performed at [place] [address] [place], [place] [address]    Culture   Final    NO GROWTH 5 DAYS Performed at [facility], [place] [address], [place], [place] [address]    Report Status 08/23/2019 FINAL  Final  Culture, blood (routine x 2)     Status: None   Collection Time: 08/18/19 10:00 PM   Specimen: BLOOD  Result Value Ref Range Status   Specimen Description   Final    BLOOD LEFT ANTECUBITAL Performed at [place] [address] [place], [place] [address]    Special Requests   Final    BOTTLES DRAWN AEROBIC AND ANAEROBIC Blood Culture results may not be optimal due to an inadequate volume of blood received in culture bottles Performed at [facility] [address] [place], [place] [address]    Culture   Final    NO GROWTH 5 DAYS Performed at [facility], [place] [address], [place], [place] [address]    Report Status 08/23/2019 FINAL  Final  MRSA PCR Screening     Status: Abnormal   Collection Time: 08/19/19  1:36 AM   Specimen: Nasal Mucosa; Nasopharyngeal  Result Value Ref Range Status   MRSA by PCR POSITIVE (A) NEGATIVE Final    Comment:        The GeneXpert MRSA Assay (FDA approved for NASAL specimens only), is one component of a comprehensive MRSA colonization surveillance program. It is not intended to diagnose MRSA infection nor to guide or monitor treatment for MRSA infections. RESULT CALLED TO, READ BACK BY AND VERIFIED WITH: KOONTEZ, A. @ [phone] 08/19/2019 PERRY, J. Performed at [facility], [place] [address], [place], [place] [address]   Culture, Urine     Status: Abnormal   Collection Time: 08/19/19  5:00 PM   Specimen: Urine, Random  Result Value Ref Range Status   Specimen Description   Final    URINE, RANDOM Performed at [facility] [address] [place], [place] [address]    Special Requests   Final    NONE Performed at [facility], [place] [address], [place],  [address]    Culture >=100,000 COLONIES/mL ESCHERICHIA COLI (A)  Final   Report Status 08/21/2019 FINAL  Final   Organism ID, Bacteria ESCHERICHIA COLI (A)  Final      Susceptibility   Escherichia coli - MIC*    AMPICILLIN >=32 RESISTANT Resistant  CEFAZOLIN 8 SENSITIVE Sensitive     CEFTRIAXONE <=0.25 SENSITIVE Sensitive     CIPROFLOXACIN <=0.25 SENSITIVE Sensitive     GENTAMICIN <=1 SENSITIVE Sensitive     IMIPENEM <=0.25 SENSITIVE Sensitive     NITROFURANTOIN <=16 SENSITIVE Sensitive     TRIMETH/SULFA <=20 SENSITIVE Sensitive     AMPICILLIN/SULBACTAM >=32 RESISTANT Resistant     PIP/TAZO <=4 SENSITIVE Sensitive     * >=100,000 COLONIES/mL ESCHERICHIA COLI  SARS CORONAVIRUS 2 (TAT 6-24 HRS) Nasopharyngeal Nasopharyngeal Swab     Status: None   Collection Time: 08/22/19   6:15 PM   Specimen: Nasopharyngeal Swab  Result Value Ref Range Status   SARS Coronavirus 2 NEGATIVE NEGATIVE Final    Comment: (NOTE) SARS-CoV-2 target nucleic acids are NOT DETECTED. The SARS-CoV-2 RNA is generally detectable in upper and lower respiratory specimens during the acute phase of infection. Negative results do not preclude SARS-CoV-2 infection, do not rule out co-infections with other pathogens, and should not be used as the sole basis for treatment or other patient management decisions. Negative results must be combined with clinical observations, patient history, and epidemiological information. The expected result is Negative. Fact Sheet for Patients: SugarRoll.be Fact Sheet for Healthcare Providers: https://www.woods-mathews.com/ This test is not yet approved or cleared by the [place] FDA and  has been authorized for detection and/or diagnosis of SARS-CoV-2 by FDA under an Emergency Use Authorization (EUA). This EUA will remain  in effect (meaning this test can be used) for the duration of the COVID-19 declaration under Section 56 4(b)(1) of the Act, 21 U.S.C. section 360bbb-3(b)(1), unless the authorization is terminated or revoked sooner. Performed at [facility], [place] [address] [place], [place] [address]      Discharge Instructions:   Discharge Instructions     Ambulatory referral to Rheumatology   Complete by: As directed    Concern for underlying inflammatory arthropathy, may need further workup/treatment   Diet - low sodium heart healthy   Complete by: As directed    Discharge instructions   Complete by: As directed    - Continue with Prednisone taper -Losartan decreased 25 mg daily - referral to Rheumatology placed for further evaluation/treatment of inflammatory arthropathy - follow up with outpatient pulmonology regarding COPD   Increase activity slowly   Complete by: As directed     Increase activity slowly   Complete by: As directed       Allergies as of 08/24/2019       Reactions   Tramadol Other (See Comments)   GI UPSET        Medication List     STOP taking these medications    HYDROcodone-homatropine 5-1.5 MG/5ML syrup Commonly known as: HYCODAN       TAKE these medications    albuterol (2.5 MG/3ML) 0.083% nebulizer solution Commonly known as: PROVENTIL Take 3 mLs (2.5 mg total) by nebulization every 6 (six) hours as needed for wheezing or shortness of breath. What changed: Another medication with the same name was removed. Continue taking this medication, and follow the directions you see here.   budesonide-formoterol 160-4.5 MCG/ACT inhaler Commonly known as: Symbicort Inhale 2 puffs into the lungs 2 (two) times daily.   budesonide-formoterol 160-4.5 MCG/ACT inhaler Commonly known as: Symbicort Inhale 2 puffs into the lungs 2 (two) times daily for 1 day.   buPROPion 150 MG 12 hr tablet Commonly known as: WELLBUTRIN SR Take 150 mg by mouth daily.   CAL MAG ZINC +D3 PO  Take 1 tablet by mouth at bedtime.   cholecalciferol 1000 units tablet Commonly known as: VITAMIN D Take 1,000 Units by mouth daily.   ciprofloxacin 500 MG tablet Commonly known as: Cipro Take 1 tablet (500 mg total) by mouth 2 (two) times daily for 10 days.   diltiazem 360 MG 24 hr capsule Commonly known as: CARDIZEM CD Take 360 mg by mouth daily.   doxycycline 100 MG tablet Commonly known as: VIBRA-TABS Take 1 tablet (100 mg total) by mouth 2 (two) times daily.   Flutter Devi Use as directed   furosemide 20 MG tablet Commonly known as: LASIX Take 40 mg by mouth daily as needed for fluid.   Garlic 967 MG Tabs Take 1 tablet by mouth daily.   HYDROcodone-acetaminophen 7.5-325 MG tablet Commonly known as: NORCO Take 1 tablet by mouth every 4 (four) hours. What changed: Another medication with the same name was removed. Continue taking this medication,  and follow the directions you see here.   hydrOXYzine 25 MG tablet Commonly known as: ATARAX/VISTARIL Take 25 mg by mouth 3 (three) times daily as needed for anxiety.   losartan 25 MG tablet Commonly known as: COZAAR Take 1 tablet (25 mg total) by mouth daily. What changed:  medication strength how much to take   ondansetron 4 MG tablet Commonly known as: ZOFRAN Take 2-4 mg by mouth every 6 (six) hours as needed for nausea/vomiting.   OXYGEN Inhale 3 L into the lungs. 24/7 DME- [facility]   potassium chloride 10 MEQ tablet Commonly known as: KLOR-CON Take 10 mEq by mouth daily as needed (swelling). When takes lasix   predniSONE 10 MG tablet Commonly known as: DELTASONE Take 3 tablets (30 mg total) by mouth daily with breakfast for 2 days, THEN 2 tablets (20 mg total) daily with breakfast for 3 days, THEN 1 tablet (10 mg total) daily with breakfast for 3 days. Start taking on: August 24, 2019 What changed: See the new instructions.   Spiriva Respimat 2.5 MCG/ACT Aers Generic drug: Tiotropium Bromide Monohydrate INHALE 2 PUFFS INTO THE LUNGS EVERY MORNING What changed:  how much to take how to take this when to take this additional instructions   Spiriva Respimat 2.5 MCG/ACT Aers Generic drug: Tiotropium Bromide Monohydrate Inhale 2 puffs into the lungs daily for 1 day. What changed: Another medication with the same name was changed. Make sure you understand how and when to take each.   spironolactone 25 MG tablet Commonly known as: ALDACTONE Take 1 tablet by mouth 2 (two) times daily.   vitamin C with rose hips 500 MG tablet Take 500 mg by mouth daily.           Time coordinating discharge: 39 minutes  Signed:  Cambree Hendrix  [facility] 08/24/2019, 11:58 AM

## 2019-08-24 NOTE — Progress Notes (Signed)
Report called Shon Baton, LPN at [facility]. [facility] at bedside ready to transport patient. Patient daughter at beside and is aware of transport

## 2019-08-24 NOTE — TOC Progression Note (Signed)
Transition of Care [facility]) - Progression Note    Patient Details  Name: [name] MRN: [id number] Date of Birth: 1945/10/24  Transition of Care [facility]) CM/SW Contact  Lavarius Doughten, Juliann Pulse, RN Phone Number: 08/24/2019, 10:21 AM  Clinical Narrative: Spoke to dtr per patient preference chose [facility] SNF-rep Freda Munro will assess & let me know if able to accept.      Expected Discharge Plan: Skilled Nursing Facility Barriers to Discharge: No Barriers Identified  Expected Discharge Plan and Services Expected Discharge Plan: [place]         Expected Discharge Date: 08/23/19                                     Social Determinants of Health (SDOH) Interventions    Readmission Risk Interventions No flowsheet data found.

## 2019-08-25 ENCOUNTER — Encounter (HOSPITAL_BASED_OUTPATIENT_CLINIC_OR_DEPARTMENT_OTHER): Payer: Medicare Other | Attending: Internal Medicine | Admitting: Internal Medicine

## 2019-08-26 LAB — METHYLMALONIC ACID, SERUM: Methylmalonic Acid, Quantitative: 284 nmol/L (ref 0–378)

## 2019-09-09 ENCOUNTER — Telehealth: Payer: Self-pay | Admitting: Gastroenterology

## 2019-09-09 NOTE — Telephone Encounter (Signed)
Okay I will await review of records but please note after today I am out of the office for the next 2 weeks and won't be here to review any records. If this is something urgent it will need to be reviewed by another MD. I can see she had LFTs that were elevated in early March when hospitalized, not sure if she has had labs repeated since that time. If she has not, would recommend repeat LFTs. I am happy to see this patient but again not sure level of urgency and when her last draw was. Can also book with APP if any openings in the next 2 weeks

## 2019-09-09 NOTE — Telephone Encounter (Signed)
Hey Dr. Havery Moros- This patient [name] is being referred to [place] by [facility]- states that she has elevated LFTs and nausea/vomitting. The doctor that she sees is Dr. Wenda Low and her phone number is [phone]. I have the records and I am sending them to you for review as you are the DOD from this afternoon when we received this referral (3/26). Please advise. Thank you!

## 2019-09-13 ENCOUNTER — Ambulatory Visit: Payer: Medicare Other | Admitting: Family Medicine

## 2019-09-13 NOTE — Telephone Encounter (Signed)
Hey Dr. Bryan Lemma- This patient was referred to [place] from a nursing home. It was sent to DOD which was Dr. Havery Moros but he is out of the office for the next 2 weeks and the nursing home requested it be sent to another doctor. You are DOD from yesterday morning when I was asked to send to another doctor. Please review previous message about why she is being referred. I show that you will be in [facility] office tomorrow 3/31 and I will have the records to be given to you for review. Please advise on scheduling. Thank you!

## 2019-09-13 NOTE — Telephone Encounter (Signed)
Chart received from [facility], reviewed and notable for the following:  PMH n/f COPD on 3L continuously, hyperkalemia, anxiety, recent AKI, inflammatory arthropathy (Rheumatology referral in place), hypertension, severe protein calorie malnutrition, chronic lower extremity wounds (follows at wound care), chronic venous dermatitis.  Admitted to [facility] on 08/18/2019 with generalized weakness, intractable nausea/vomiting admitted with AKI with BUN/creatinine 87/3.56 (baseline creatinine 0.44), K+ 5.3, albumin 2.2. Elevated liver enzymes on admission with AST/ALT 103/124, ALP 282, T bili 1.8. Most recent comparison was 05/2018, with normal liver enzymes at that time. WBC 15.6, Hgb 9.4 with MCV 103 (baseline 12.9), PLT 195, pyuria. CXR unremarkable, renal ultrasound without acute findings.       Treated with LR, ABX for underlying UTI, prednisone for right hand inflammatory arthropathy. Acute hepatitis panel was unremarkable, RUQ [place] with layering sludge/small stones without evidence of cholecystitis. No GI consult as inpatient. Was discharged to skilled nursing facility.  -RUQ [place] (08/19/2019): Layering sludge/small stones in GB without wall thickening, pericholecystic fluid. Negative Murphy sign. CBD 4.8 mm. Liver with increased echotexture, but no duct dilation, c/w hepatic steatosis. PV patent. -08/18/2019: AST/ALT/ALP 183/124/282, T bili 1.8 -08/19/2019: AST/ALT/ALP 130/109/259, T bili 1.6 -08/19/2019: AST/ALT/ALP 102/95/273, T bili 1.6 -Normal ammonia, INR -Covid negative -Blood cultures negative -Urine culture positive for E. coli -Discharged on 08/24/2019 to skilled nursing facility  Notes/records above are all essentially from hospital discharge summary notes. No new labs since hospital discharge for review in the available packet. Referral seemingly for evaluation of recently noted elevated liver enzymes in the setting of severe AKI 2/2 UTI, along with nausea/vomiting at that  time. If no new/worsening GI concerns, routine, next available appointment with either me, Dr. Havery Moros, or one of the APP's would be appropriate. Recommend repeating liver enzymes now at SNF. If uptrending, that certainly changes the urgency of this evaluation. If downtrending/normalizing, can be reviewed at the time of appointment.

## 2019-09-15 NOTE — Telephone Encounter (Signed)
Left message for Tobie Poet at [facility] to get patient scheduled.

## 2019-09-15 NOTE — Telephone Encounter (Signed)
Patient scheduled with Jaclyn Shaggy on 4/8[name] at [facility] is having patient repeat Liver Enzyme labs per Dr. Lora Havens request and will fax those results when they get them. She stated will have them before appointment.

## 2019-09-19 ENCOUNTER — Encounter: Payer: Self-pay | Admitting: Family Medicine

## 2019-09-19 ENCOUNTER — Other Ambulatory Visit: Payer: Self-pay

## 2019-09-19 ENCOUNTER — Ambulatory Visit: Payer: Self-pay

## 2019-09-19 ENCOUNTER — Ambulatory Visit (INDEPENDENT_AMBULATORY_CARE_PROVIDER_SITE_OTHER): Payer: Medicare Other | Admitting: Family Medicine

## 2019-09-19 DIAGNOSIS — E790 Hyperuricemia without signs of inflammatory arthritis and tophaceous disease: Secondary | ICD-10-CM | POA: Diagnosis not present

## 2019-09-19 DIAGNOSIS — M25512 Pain in left shoulder: Secondary | ICD-10-CM

## 2019-09-19 DIAGNOSIS — R768 Other specified abnormal immunological findings in serum: Secondary | ICD-10-CM

## 2019-09-19 DIAGNOSIS — M79641 Pain in right hand: Secondary | ICD-10-CM | POA: Diagnosis not present

## 2019-09-19 NOTE — Progress Notes (Signed)
Office Visit Note   Patient: [name]           Date of Birth: 04-12-1946           MRN: [id number] Visit Date: 09/19/2019 Requested by: Christain Sacramento, MD [address] [place] [address] [place],  [place] [address] PCP: Christain Sacramento, MD  Subjective: Chief Complaint  Patient presents with  . Right Hand - Pain    Pain and swelling in the hand x 3 weeks. Fingers II-IV are stuck in a flexed position.     HPI: She is here with right hand pain.  Last month she was hospitalized with acute kidney injury.  During her hospitalization she had significant swelling of her right hand.  She had labs showing elevated C-reactive protein, sed rate, uric acid level and rheumatoid factor.  She was apparently placed on prednisone.  She is still unable to extend her fingers and she has pain across the MCP joints of the second through fourth fingers.  She is right-hand dominant.  No problems with her left hand.  She also complains of stiffness and pain in her left shoulder and weakness in both of her legs.               ROS: No fevers or chills.  All other systems were reviewed and are negative.  Objective: Vital Signs: There were no vitals taken for this visit.  Physical Exam:  General:  Alert and oriented, in no acute distress. Pulm:  Breathing unlabored. Psy:  Normal mood, congruent affect. Skin: No erythema today. Right hand: She has 1+ swelling of the MCP joints of the second through fourth fingers.  I am able to fully passively extend her fingers at the MCP joints but she cannot actively keep them there.  The joints appear to be subluxed with the proximal phalanx of the fingers shifted volarly relative to the metacarpals.   Imaging: X-rays right hand: Moderate to severe joint space narrowing of the second and third MCP joints.  She has subluxation of the second through fourth MCP joints, no erosions seen.    Assessment & Plan: 1.  Right hand second through fourth MCP pain, swelling and subluxation,  with positive rheumatoid factor and elevated uric acid level.  Suspicion is for rheumatoid arthritis.  Also having left shoulder pain and bilateral leg pain. -We will refer her to rheumatology for further evaluation and treatment. -She has a hand splint at home from her hospitalization, I encouraged her to wear it part-time during the day to prevent flexion contracture.     Procedures: No procedures performed  No notes on file     PMFS History: Patient Active Problem List   Diagnosis Date Noted  . 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 Protein-calorie malnutrition, severe 08/20/2019  . Severe sepsis ([place]) 08/19/2019  . Acute lower UTI 08/19/2019  . Pressure injury of skin 08/19/2019  . ARF (acute renal failure) ([place]) 08/18/2019  . Hyperkalemia 08/18/2019  . Transaminitis 08/18/2019  . Generalized weakness 08/18/2019  . Leukocytosis 08/18/2019  . Anemia 08/18/2019  . Acute renal failure (ARF) ([place]) 08/18/2019  . Chronic respiratory failure with hypoxia and hypercapnia ([place]) 06/02/2018  . COPD with acute exacerbation ([place]) 05/09/2018  . Hyponatremia 05/09/2018  . Acute urinary retention 05/09/2018  . Cystocele with prolapse 03/02/2018  . Cor pulmonale, chronic ([place]) 06/29/2017  . Chronic respiratory failure with hypoxia ([place]) 06/30/2014  . Pulmonary infiltrates 01/26/2013  . Nodule of right lung 08/19/2012  .  Cancer screening 05/12/2012  . COPD exacerbation ([place]) 05/12/2012  . Essential hypertension 03/28/2012  . COPD GOLD III 12/02/2011  . Chronic cough 12/02/2011  . Cigarette smoker 12/02/2011   Past Medical History:  Diagnosis Date  . Abrasion    under left knee x 2 places changing dressing q day of bid, applying ssd cream area healing due to fell 2 weeks ago  . Blood dyscrasia    BLEEDS EASY  . Bruises easily    bleeds easily  . Closed patellar sleeve fracture of left knee 01/04/2018  . Closed patellar sleeve fracture of right knee 01/04/2018   healing   .  COPD (chronic obstructive pulmonary disease) ([place])   . Coronary artery disease    per dr Philis Kendall note 01-01-18 note  . Hernia, inguinal    LEFT  . Hypertension    states under control with meds., has been on med. > 10 yr.  . On home oxygen therapy    at night 2L/min  . Pneumonia    "SEVERAL TIMES IN PAST, LAST TIME 2018"  . Pulmonary hypertension ([place])    per dr Terrence Dupont 7-19 19 lov note  . Shortness of breath dyspnea    with exertion  . SVD (spontaneous vaginal delivery)    x 1  . Thin skin   . Urinary tract infection 2019  . Wears partial dentures    lower partial and upper plate    Family History  Problem Relation Age of Onset  . Heart disease Father   . Kidney failure Father     Past Surgical History:  Procedure Laterality Date  . COLONOSCOPY    . CYSTOCELE REPAIR N/A 03/02/2018   Procedure: ANTERIOR REPAIR (CYSTOCELE);  Surgeon: Cheri Fowler, MD;  Location: [facility];  Service: Gynecology;  Laterality: N/A;  OUTPATIENT IN BED  . MULTIPLE TOOTH EXTRACTIONS    . RESECTION DISTAL CLAVICAL Left 10/12/2014   Procedure: DISTAL CLAVICLE EXCISION;  Surgeon: Kathryne Hitch, MD;  Location: [facility];  Service: Orthopedics;  Laterality: Left;  . SHOULDER ARTHROSCOPY WITH ROTATOR CUFF REPAIR AND SUBACROMIAL DECOMPRESSION Left 10/12/2014   Procedure: LEFT SHOULDER ARTHROSCOPY, DEBRIDEMENT WITH ACROMIOPLASTY,  ROTATOR CUFF REPAIR AND RELEASE BICEPS TENDON;  Surgeon: Kathryne Hitch, MD;  Location: [facility];  Service: Orthopedics;  Laterality: Left;   Social History   Occupational History  . Occupation: weaver  Tobacco Use  . Smoking status: Former Smoker    Packs/day: 1.00    Years: 57.00    Pack years: 57.00    Types: Cigarettes    Quit date: 03/20/2017    Years since quitting: 2.5  . Smokeless tobacco: Never Used  . Tobacco comment: <1ppd 02/05/2016  Substance and Sexual Activity  . Alcohol use: Yes    Alcohol/week: 28.0 standard drinks     Types: 14 Cans of beer, 14 Shots of liquor per week    Comment: several beers/day and "a shot of whiskey"  . Drug use: No  . Sexual activity: Not Currently    Birth control/protection: Post-menopausal

## 2019-09-22 ENCOUNTER — Ambulatory Visit (INDEPENDENT_AMBULATORY_CARE_PROVIDER_SITE_OTHER): Payer: Medicare Other | Admitting: Nurse Practitioner

## 2019-09-22 ENCOUNTER — Encounter: Payer: Self-pay | Admitting: Nurse Practitioner

## 2019-09-22 ENCOUNTER — Other Ambulatory Visit: Payer: Self-pay

## 2019-09-22 VITALS — BP 106/62 | HR 104 | Temp 97.7°F | Ht 62.0 in | Wt 100.0 lb

## 2019-09-22 DIAGNOSIS — D649 Anemia, unspecified: Secondary | ICD-10-CM | POA: Diagnosis not present

## 2019-09-22 NOTE — Patient Instructions (Signed)
If you are age 74 or older, your body mass index should be between 23-30. Your Body mass index is 18.29 kg/m. If this is out of the aforementioned range listed, please consider follow up with your Primary Care Provider.  If you are age 23 or younger, your body mass index should be between 19-25. Your Body mass index is 18.29 kg/m. If this is out of the aformentioned range listed, please consider follow up with your Primary Care Provider.   Continue Zofran   Please have the following labs drawn at the facility:  Hepatic Panel, GGT, BMP, SMA, AMA, PT/INR, CBC, IGG.   Thank you,  Carl Best, NP

## 2019-09-22 NOTE — Progress Notes (Signed)
09/22/2019 [name] [id number] 04-24-1946   CHIEF COMPLAINT:  Elevated liver enzymes, anemia   HISTORY OF PRESENT ILLNESS:  [name] is a 74 year old female with a past medical history of hypertension, coronary artery disease, COPD on oxygen 3L [place] at home, inflammatory arthropathy and alcohol abuse.  She was referred to our office by Dr. Wenda Low at [facility] for further evaluation regarding nausea, vomiting and elevated LFTs. She was admitted to [facility] on 08/18/2019 with complaints of arm pain, body rash, generalized weakness, intractable N/V with AKI and elevated LFTs. WBC 15.6. Hg 9.4. MCV 102.8. PLT 195. AST 183. ALT 124. Alk phos 282. T. Bili 1.8. Acute hepatitis panel was negative. COVID 19 negative.  A RUQ sonogram showed layering sludge/small stones without evidence of cholecystitis, CBD 4.7m and liver with increased echotexture  consistent with hepatic steatosis. Urine cultures were positive for E. Coli and she was treated with Cipro 5029mpo bid  for 10 days. She was prescribed Prednisone for right hand inflammatory arthropathy verses gout. Rheumatology referral recommended. She was discharged to SNF on 08/24/2019.   [facility] She is sitting up in the wheel chair. She has some nausea which she associated to her pain medications. No vomiting. No dysphagia or heartburn. No upper of lower abdominal pain. She passed a large soft brown BM yesterday. No melena or rectal bleeding. No yellow or white stools. She is on Doxycycline twice daily for 30 days due to a staph/pseudomonous  infection to her legs. [facility] She denies having any prior history of liver disease. She underwent one colonoscopy in her lifetime which she reported as normal, possibly was done in her 5011'sShe reported having a normal Cologuard test done a few years ago. No family history of liver  disease or colon cancer.    CBC Latest Ref Rng & Units 08/23/2019 08/22/2019 08/21/2019  WBC 4.0 - 10.5 K/uL 11.4(H) 10.2 9.9  Hemoglobin 12.0 - 15.0 g/dL 9.0(L) 8.2(L) 9.6(L)  Hematocrit 36.0 - 46.0 % 27.8(L) 25.6(L) 30.6(L)  Platelets 150 - 400 K/uL 151 145(L) 153    Hepatic Function Latest Ref Rng & Units 08/19/2019 08/19/2019 08/18/2019  Total Protein 6.5 - 8.1 g/dL 4.8(L) 5.0(L) 5.4(L)  Albumin 3.5 - 5.0 g/dL 2.0(L) 2.0(L) 2.2(L)  AST 15 - 41 U/L 102(H) 130(H) 183(H)  ALT 0 - 44 U/L 95(H) 109(H) 124(H)  Alk Phosphatase 38 - 126 U/L 273(H) 259(H) 282(H)  Total Bilirubin 0.3 - 1.2 mg/dL 1.6(H) 1.6(H) 1.8(H)  Bilirubin, Direct 0.0 - 0.2 mg/dL - 0.8(H) -   RUQ sonogram 08/19/2019: Layering sludge/small stones.  No evidence of cholecystitis. Hepatic steatosis   Past Medical History:  Diagnosis Date  . Abrasion    under left knee x 2 places changing dressing q day of bid, applying ssd cream area healing due to fell 2 weeks ago  . Blood dyscrasia    BLEEDS EASY  . Bruises easily    bleeds easily  . Closed patellar sleeve fracture of left knee 01/04/2018  . Closed patellar sleeve fracture of right knee 01/04/2018   healing   . COPD [place]  . Coronary artery disease    per dr haPhilis Kendallote 01-01-18 note  . Hernia, inguinal    LEFT  . Hypertension    states under control with meds., has been on  med. > 10 yr.  . On home oxygen therapy    at night 2L/min  . Pneumonia    "SEVERAL TIMES IN PAST, LAST TIME 2018"  . Pulmonary hypertension ([place])    per dr Terrence Dupont 7-19 19 lov note  . Shortness of breath dyspnea    with exertion  . SVD (spontaneous vaginal delivery)    x 1  . Thin skin   . Urinary tract infection 2019  . Wears partial dentures    lower partial and upper plate   Past Surgical History:  Procedure Laterality Date  . COLONOSCOPY    . CYSTOCELE REPAIR N/A 03/02/2018   Procedure: ANTERIOR REPAIR (CYSTOCELE);  Surgeon: Cheri Fowler, MD;  Location: [facility];  Service: Gynecology;  Laterality: N/A;  OUTPATIENT IN BED  . MULTIPLE TOOTH EXTRACTIONS    . RESECTION DISTAL CLAVICAL Left 10/12/2014   Procedure: DISTAL CLAVICLE EXCISION;  Surgeon: Kathryne Hitch, MD;  Location: [facility];  Service: Orthopedics;  Laterality: Left;  . SHOULDER ARTHROSCOPY WITH ROTATOR CUFF REPAIR AND SUBACROMIAL DECOMPRESSION Left 10/12/2014   Procedure: LEFT SHOULDER ARTHROSCOPY, DEBRIDEMENT WITH ACROMIOPLASTY,  ROTATOR CUFF REPAIR AND RELEASE BICEPS TENDON;  Surgeon: Kathryne Hitch, MD;  Location: [facility];  Service: Orthopedics;  Laterality: Left;    reports that she quit smoking about 2 years ago. Her smoking use included cigarettes. She has a 57.00 pack-year smoking history. She has never used smokeless tobacco. She reports current alcohol use of about 28.0 standard drinks of alcohol per week. She reports that she does not use drugs. family history includes Heart disease in her father; Kidney failure in her father. Allergies  Allergen Reactions  . Tramadol Other (See Comments)    GI UPSET      Outpatient Encounter Medications as of 09/22/2019  Medication Sig  . albuterol (PROVENTIL) (2.5 MG/3ML) 0.083% nebulizer solution Take 3 mLs (2.5 mg total) by nebulization every 6 (six) hours as needed for wheezing or shortness of breath.  . Ascorbic Acid (VITAMIN C WITH ROSE HIPS) 500 MG tablet Take 500 mg by mouth daily.  . budesonide-formoterol (SYMBICORT) 160-4.5 MCG/ACT inhaler Inhale 2 puffs into the lungs 2 (two) times daily.  . budesonide-formoterol (SYMBICORT) 160-4.5 MCG/ACT inhaler Inhale 2 puffs into the lungs 2 (two) times daily for 1 day.  Marland Kitchen buPROPion (WELLBUTRIN SR) 150 MG 12 hr tablet Take 150 mg by mouth daily.   . cholecalciferol (VITAMIN D) 1000 UNITS tablet Take 1,000 Units by mouth daily.  Marland Kitchen diltiazem (CARDIZEM CD) 360 MG 24 hr capsule Take 360 mg by mouth daily.   Marland Kitchen doxycycline (VIBRA-TABS) 100 MG  tablet Take 1 tablet (100 mg total) by mouth 2 (two) times daily.  . furosemide (LASIX) 20 MG tablet Take 40 mg by mouth daily as needed for fluid.   . Garlic 696 MG TABS Take 1 tablet by mouth daily.  Marland Kitchen HYDROcodone-acetaminophen (NORCO) 7.5-325 MG tablet Take 1 tablet by mouth every 4 (four) hours.  . hydrOXYzine (ATARAX/VISTARIL) 25 MG tablet Take 25 mg by mouth 3 (three) times daily as needed for anxiety.  Marland Kitchen losartan (COZAAR) 25 MG tablet Take 1 tablet (25 mg total) by mouth daily.  . Multiple Minerals-Vitamins (CAL MAG ZINC +D3 PO) Take 1 tablet by mouth at bedtime.  . ondansetron (ZOFRAN) 4 MG tablet Take 2-4 mg by mouth every 6 (six) hours as needed for nausea/vomiting.  . OXYGEN Inhale 3 L into the lungs. 24/7 DME- [employer]  . potassium chloride (  K-DUR) 10 MEQ tablet Take 10 mEq by mouth daily as needed (swelling). When takes lasix   . Respiratory Therapy Supplies (FLUTTER) DEVI Use as directed  . spironolactone (ALDACTONE) 25 MG tablet Take 1 tablet by mouth 2 (two) times daily.  . Tiotropium Bromide Monohydrate (SPIRIVA RESPIMAT) 2.5 MCG/ACT AERS INHALE 2 PUFFS INTO THE LUNGS EVERY MORNING (Patient taking differently: Inhale 2 puffs into the lungs every morning. )  . Tiotropium Bromide Monohydrate (SPIRIVA RESPIMAT) 2.5 MCG/ACT AERS Inhale 2 puffs into the lungs daily for 1 day.   No facility-administered encounter medications on file as of 09/22/2019.     REVIEW OF SYSTEMS: All other systems reviewed and negative except where noted in the History of Present Illness.   PHYSICAL EXAM: BP 106/62   Pulse (!) 104   Temp 97.7 F (36.5 C)   Ht 5' 2" (1.575 m)   Wt 100 lb (45.4 kg)   BMI 18.29 kg/m  General: thin ill appearing cachectic  74 year old female in no acute distress. Head: Normocephalic and atraumatic. Eyes:  Sclerae non-icteric, conjunctive pink. Ears: Normal auditory acuity. Mouth: Upper dentures, lower partial plate. No ulcers or lesions.  Neck: Supple, no  lymphadenopathy or thyromegaly.  Lungs: Diminished breath sounds throughout, few tight exp wheezes.  Heart: Regular rate and rhythm. No murmur, rub or gallop appreciated.  Abdomen: Soft, nontender, non distended. No masses. No hepatosplenomegaly. Normoactive bowel sounds x 4 quadrants.  Rectal: Deferred.  Musculoskeletal: Symmetrical with no gross deformities. Skin: Right wrist/hand in brace. Bilateral purple plaques scattered to LEs.  Extremities: No edema. Neurological: Alert oriented x 4, no focal deficits.  Psychological:  Alert and cooperative. Normal mood and affect.  ASSESSMENT AND PLAN:  79. 74 year old female admitted to [facility] 3/5 with N/V and elevated LFTs consistent with acute cholestasis. History of EtOH abuse.   Abdominal sonogram showed hepatic steatosis, small gallstones and CBD measured at 4.72m.  -Hepatic panel, AMA, SMA, GGT, INR/PT -Abdominal MRI/MRCP preferred however, patient unable to tolerate due to labored breathing which interferes with  MRI imaging. If LFTs, Alk phos and T. Bili  increasing to consider abd/pelvic CT with contrast. -Further recommendations to be determined after the above lab results received.  -Zofran PRN -No alcohol discussed   2. Macrocytic anemia. Labs 3/5: Vitamin B12 lLevel 1265. Iron 38.  -CBC  -No plans for endoscopic evaluation at this time   3. COPD on 4L [place]  4. AKI, resolved  -BMP    CC:  WChristain Sacramento MD

## 2019-09-23 NOTE — Progress Notes (Signed)
Agree with the assessment and plan as outlined by Carl Best, NP.   Gerrit Heck, DO, [facility]

## 2019-09-26 ENCOUNTER — Encounter: Payer: Self-pay | Admitting: Nurse Practitioner

## 2019-10-05 ENCOUNTER — Ambulatory Visit (INDEPENDENT_AMBULATORY_CARE_PROVIDER_SITE_OTHER): Payer: Medicare Other | Admitting: Primary Care

## 2019-10-05 ENCOUNTER — Encounter: Payer: Self-pay | Admitting: Primary Care

## 2019-10-05 ENCOUNTER — Other Ambulatory Visit: Payer: Self-pay

## 2019-10-05 ENCOUNTER — Ambulatory Visit: Payer: Medicare Other | Admitting: Internal Medicine

## 2019-10-05 ENCOUNTER — Telehealth: Payer: Self-pay | Admitting: Primary Care

## 2019-10-05 VITALS — BP 106/50 | HR 74 | Temp 97.4°F | Ht 62.0 in | Wt 98.0 lb

## 2019-10-05 DIAGNOSIS — J449 Chronic obstructive pulmonary disease, unspecified: Secondary | ICD-10-CM | POA: Diagnosis not present

## 2019-10-05 DIAGNOSIS — J9611 Chronic respiratory failure with hypoxia: Secondary | ICD-10-CM

## 2019-10-05 DIAGNOSIS — Z23 Encounter for immunization: Secondary | ICD-10-CM | POA: Diagnosis not present

## 2019-10-05 MED ORDER — ARFORMOTEROL TARTRATE 15 MCG/2ML IN NEBU: 15.0000 ug | INHALATION_SOLUTION | Freq: Two times a day (BID) | RESPIRATORY_TRACT | 6 refills | Status: DC

## 2019-10-05 MED ORDER — BUDESONIDE 0.5 MG/2ML IN SUSP: 0.5000 mg | Freq: Two times a day (BID) | RESPIRATORY_TRACT | 6 refills | Status: DC

## 2019-10-05 NOTE — Assessment & Plan Note (Signed)
-  Education sent home with patient on how to acquire getting covid vaccine. Patient is willing to get vaccine.

## 2019-10-05 NOTE — Progress Notes (Signed)
_0  ID: [name], female    DOB: 1945/10/31, 74 y.o.   MRN: [id number]  Chief Complaint  Patient presents with  . Follow-up    COPD.  last 2 days used rescue inhaler daily.      Referring provider: Christain Sacramento, MD  HPI: Patient is a 74 yo female, former smoker (quit 2018; 57 pack-years). PMH significant for COPD Gold III, Pulmonary infiltrates, Chronic respiratory failure w/ hypoxia, HTN, [place], ARF, chronic cough, right lung nodule, anemia. Patient of Dr. Melvyn Novas last office visit was with him on 01/12/2019. Patient is currently taking Spiriva respimat 2 puffs Daily, Symbicort 160 mcg BID, and Albuterol nebulizer/inhaler. Patient also uses mucinex and flutter as needed for chest congestion. Oxygen 3 l/m 24/7.  Patient was referred to pulmonary rehab in 2019 which she did not go. Patient was educated on using prednisone as plan D in avs instructions.  PFT: 06/29/2017: FEV1 0.92 (44 % ) ratio 44  p 11 % improvement from saba p symb/spiriva prior to study with DLCO  33/33 % corrects to 35 % for alv volume   Imaging: CXR 08/18/2019: Cardiac shadow is stable. Mild aortic calcifications are seen. The lungs are hyperinflated consistent with COPD. Mild scarring is noted in the bases bilaterally. No focal infiltrate is seen. No sizable effusion is noted.  10/05/2019  Patient come in today for follow up office visit. Patient is currently at [facility] rehab facility since 08/24/2019. Still on oxygen 3l/m 24/7. Patient states that her pulse oximetry levels are good (mid 90s) but she feels that she needs oxygen continuously for comfort and to reduce work of breathing. She states that she doesn't have her flutter valve or nebulizer since she was moved to her rehab. She said she can asks someone to see if they can bring these to her, but will notify office if she is having trouble getting this equipment from her home. She was taking Symbicort 2 puffs BID and Spiriva respimat 2 puffs Daily  prior to being in rehab. Her rehab does not carry Spiriva respimat so they have been giving her Spiriva handihaler once daily instead. She also has been having trouble using her Symbicort and is unable to mash the inhaler down enough to propel the medicine. She states that the workers at the facility were struggling as well with administering inhaler. I had patient use her Symbicort inhaler in office to make sure she is using effectively. Patient was unable to mash inhaler and use appropriately. The inhaler had medicine in it and I was able to press down to propel medicine. Educated to patient that we probably need to start nebulizers (Pulmicort and Brovana) instead of using Symbicort to make sure she is getting her medicine. Patient states that the nurse at the rehab have to administer her Symbicort and Spiriva handihaler and she doesn't like DPI's and would prefer to use her Spiriva respimat. She doesn't feel she receives the medication effectively with the handihaler. We informed patient to let facility know that she has her own Spiriva respimat inhaler in her belongings and to use that instead of Spiriva Handihaler.   Patient states she only uses her albuterol inhaler about once a week. Patient gets sob with exertion such as changing her clothes or doing therapy where she will have to take a minute to catch her breath. She has an occasional wheeze and cough but not often. Denies chest pain, swelling, palpitations, chest tightness.  Patient is interested in receive  the covid 19 vaccine. Requested information on how to receive. Patient informed that we would send home some information on how to receive vaccination.    Allergies  Allergen Reactions  . Tramadol Other (See Comments)    GI UPSET    Immunization History  Administered Date(s) Administered  . Influenza Split 03/17/2011, 03/30/2013, 04/03/2014, 02/14/2015  . Influenza Whole 03/17/2012, 03/09/2016  . Influenza, High Dose Seasonal PF  03/10/2017, 03/15/2018, 04/18/2019  . Pneumococcal Conjugate-13 01/16/2014, 04/18/2019  . Pneumococcal Polysaccharide-23 06/16/2008  . Tdap 06/16/2005, 07/15/2016    Past Medical History:  Diagnosis Date  . Abrasion    under left knee x 2 places changing dressing q day of bid, applying ssd cream area healing due to fell 2 weeks ago  . Blood dyscrasia    BLEEDS EASY  . Bruises easily    bleeds easily  . Closed patellar sleeve fracture of left knee 01/04/2018  . Closed patellar sleeve fracture of right knee 01/04/2018   healing   . COPD (chronic obstructive pulmonary disease) ([place])   . Coronary artery disease    per dr Philis Kendall note 01-01-18 note  . Hernia, inguinal    LEFT  . Hypertension    states under control with meds., has been on med. > 10 yr.  . On home oxygen therapy    at night 2L/min  . Pneumonia    "SEVERAL TIMES IN PAST, LAST TIME 2018"  . Pulmonary hypertension ([place])    per dr Terrence Dupont 7-19 19 lov note  . Shortness of breath dyspnea    with exertion  . SVD (spontaneous vaginal delivery)    x 1  . Thin skin   . Urinary tract infection 2019  . Wears partial dentures    lower partial and upper plate    Tobacco History: Social History   Tobacco Use  Smoking Status Former Smoker  . Packs/day: 1.00  . Years: 57.00  . Pack years: 57.00  . Types: Cigarettes  . Quit date: 03/20/2017  . Years since quitting: 2.5  Smokeless Tobacco Never Used  Tobacco Comment   <1ppd 02/05/2016   Counseling given: Not Answered Comment: <1ppd 02/05/2016   Outpatient Medications Prior to Visit  Medication Sig Dispense Refill  . albuterol (PROVENTIL) (2.5 MG/3ML) 0.083% nebulizer solution Take 3 mLs (2.5 mg total) by nebulization every 6 (six) hours as needed for wheezing or shortness of breath. 75 mL 12  . Ascorbic Acid (VITAMIN C WITH ROSE HIPS) 500 MG tablet Take 500 mg by mouth daily.    Marland Kitchen buPROPion (WELLBUTRIN SR) 150 MG 12 hr tablet Take 150 mg by mouth daily.     .  cholecalciferol (VITAMIN D) 1000 UNITS tablet Take 1,000 Units by mouth daily.    Marland Kitchen diltiazem (CARDIZEM CD) 360 MG 24 hr capsule Take 360 mg by mouth daily.   3  . furosemide (LASIX) 20 MG tablet Take 40 mg by mouth daily as needed for fluid.     . Garlic 676 MG TABS Take 1 tablet by mouth daily.    Marland Kitchen HYDROcodone-acetaminophen (NORCO) 7.5-325 MG tablet Take 1 tablet by mouth every 4 (four) hours.    . hydrOXYzine (ATARAX/VISTARIL) 25 MG tablet Take 25 mg by mouth 3 (three) times daily as needed for anxiety.    Marland Kitchen losartan (COZAAR) 25 MG tablet Take 1 tablet (25 mg total) by mouth daily. 30 tablet 0  . Multiple Minerals-Vitamins (CAL MAG ZINC +D3 PO) Take 1 tablet by mouth at  bedtime.    . ondansetron (ZOFRAN) 4 MG tablet Take 2-4 mg by mouth every 6 (six) hours as needed for nausea/vomiting.    . OXYGEN Inhale 3 L into the lungs. 24/7 DME- [employer]    . potassium chloride (K-DUR) 10 MEQ tablet Take 10 mEq by mouth daily as needed (swelling). When takes lasix   3  . Respiratory Therapy Supplies (FLUTTER) DEVI Use as directed 1 each 0  . spironolactone (ALDACTONE) 25 MG tablet Take 1 tablet by mouth 2 (two) times daily.    . Tiotropium Bromide Monohydrate (SPIRIVA RESPIMAT) 2.5 MCG/ACT AERS INHALE 2 PUFFS INTO THE LUNGS EVERY MORNING (Patient taking differently: Inhale 2 puffs into the lungs every morning. ) 4 g 3  . budesonide-formoterol (SYMBICORT) 160-4.5 MCG/ACT inhaler Inhale 2 puffs into the lungs 2 (two) times daily. 1 Inhaler 0   No facility-administered medications prior to visit.      Review of Systems  Review of Systems  Constitutional: Negative for chills, fatigue and fever.  HENT: Negative for congestion, postnasal drip and sinus pressure.   Eyes: Negative for itching.  Respiratory: Positive for shortness of breath. Negative for cough, chest tightness and wheezing.   Cardiovascular: Negative for chest pain and leg swelling.  Allergic/Immunologic: Negative for environmental  allergies.     Physical Exam  BP (!) 106/50 (BP Location: Left Arm, Cuff Size: Normal)   Pulse 74   Temp (!) 97.4 F (36.3 C) (Temporal)   Ht _0  (1.575 m)   Wt 98 lb (44.5 kg)   SpO2 99%   BMI 17.92 kg/m  Physical Exam Constitutional:      Appearance: Normal appearance.  HENT:     Head: Normocephalic.     Mouth/Throat:     Mouth: Mucous membranes are moist.  Cardiovascular:     Rate and Rhythm: Normal rate and regular rhythm.  Pulmonary:     Effort: Pulmonary effort is normal.     Breath sounds: No wheezing, rhonchi or rales.     Comments: diminished BS throughout Skin:    General: Skin is warm.  Neurological:     Mental Status: She is alert.  Psychiatric:        Mood and Affect: Mood normal.        Behavior: Behavior normal.      Lab Results:  CBC    Component Value Date/Time   WBC 11.4 (H) 08/23/2019 0808   RBC 2.71 (L) 08/23/2019 0808   HGB 9.0 (L) 08/23/2019 0808   HCT 27.8 (L) 08/23/2019 0808   PLT 151 08/23/2019 0808   MCV 102.6 (H) 08/23/2019 0808   MCH 33.2 08/23/2019 0808   MCHC 32.4 08/23/2019 0808   RDW 18.6 (H) 08/23/2019 0808   LYMPHSABS 1.2 08/19/2019 0239   MONOABS 1.0 08/19/2019 0239   EOSABS 0.0 08/19/2019 0239   BASOSABS 0.1 08/19/2019 0239    BMET    Component Value Date/Time   NA 143 08/23/2019 0808   K 4.3 08/23/2019 0808   CL 104 08/23/2019 0808   CO2 32 08/23/2019 0808   GLUCOSE 109 (H) 08/23/2019 0808   BUN 31 (H) 08/23/2019 0808   CREATININE 0.58 08/23/2019 0808   CALCIUM 8.5 (L) 08/23/2019 0808   GFRNONAA >60 08/23/2019 0808   GFRAA >60 08/23/2019 0808    BNP No results found for: BNP  ProBNP    Component Value Date/Time   PROBNP 102.0 08/19/2012 1455    Imaging: No results found.   Assessment &  Plan:   Need for vaccination - Education sent home with patient on how to acquire getting covid vaccine. Patient is willing to get vaccine.  Chronic respiratory failure with hypoxia (HCC) - Continue oxygen  3 liters as needed during the day/night to maintain Oxygen sats 88% or greater. - Patient currently is using continuously for patient comfort  COPD GOLD III -  Due to patient not able to use effectively; discontinued Symbicort 160 mcg inhaler and changed to Pulmicort 0.5 mg nebulizer two time a day (rinse after use) and Brovana 15 mcg nebulizer two time a day. Patient is to notify office if any problems with medication changes (Pharmacy to help with benefits investigation) - Continue Spirvia Respimat 2 puffs Daily - Continue Albuterol nebulizer or inhaler every 4 hours as needed - Continue Flutter valve as needed for chest congestion - Please notify office if having any issues receiving a nebulizer machine or flutter valve - Follow up with Dr. Melvyn Novas in 3 months     Martyn Ehrich, NP 10/05/2019

## 2019-10-05 NOTE — Patient Instructions (Addendum)
-  Discontinued Symbicort 160 mcg - Ordered Pulmicort .5 mg nebulizer two time a day (rinse after use) - Ordered Brovana 15 mcg nebulizer two time a day  - Continue Spirvia Respimat 2 puffs Daily  - Continue Albuterol nebulizer or inhaler every 4 hours as needed - Continue Flutter valve as needed for chest congestion - Continue oxygen 3 liters as needed to maintain Oxygen sats 88% or greater. - COVID-19 Vaccine Information can be found at: ShippingScam.co.uk For questions related to vaccine distribution or appointments, please email [email or web address] or call [phone].    Chronic Obstructive Pulmonary Disease Chronic obstructive pulmonary disease (COPD) is a long-term (chronic) lung problem. When you have COPD, it is hard for air to get in and out of your lungs. Usually the condition gets worse over time, and your lungs will never return to normal. There are things you can do to keep yourself as healthy as possible.  Your doctor may treat your condition with: ? Medicines. ? Oxygen. ? Lung surgery.  Your doctor may also recommend: ? Rehabilitation. This includes steps to make your body work better. It may involve a team of specialists. ? Quitting smoking, if you smoke. ? Exercise and changes to your diet. ? Comfort measures (palliative care). Follow these instructions at home: Medicines  Take over-the-counter and prescription medicines only as told by your doctor.  Talk to your doctor before taking any cough or allergy medicines. You may need to avoid medicines that cause your lungs to be dry. Lifestyle  If you smoke, stop. Smoking makes the problem worse. If you need help quitting, ask your doctor.  Avoid being around things that make your breathing worse. This may include smoke, chemicals, and fumes.  Stay active, but remember to rest as well.  Learn and use tips on how to relax.  Make sure you get enough  sleep. Most adults need at least 7 hours of sleep every night.  Eat healthy foods. Eat smaller meals more often. Rest before meals. Controlled breathing Learn and use tips on how to control your breathing as told by your doctor. Try:  Breathing in (inhaling) through your nose for 1 second. Then, pucker your lips and breath out (exhale) through your lips for 2 seconds.  Putting one hand on your belly (abdomen). Breathe in slowly through your nose for 1 second. Your hand on your belly should move out. Pucker your lips and breathe out slowly through your lips. Your hand on your belly should move in as you breathe out.  Controlled coughing Learn and use controlled coughing to clear mucus from your lungs. Follow these steps: 1. Lean your head a little forward. 2. Breathe in deeply. 3. Try to hold your breath for 3 seconds. 4. Keep your mouth slightly open while coughing 2 times. 5. Spit any mucus out into a tissue. 6. Rest and do the steps again 1 or 2 times as needed. General instructions  Make sure you get all the shots (vaccines) that your doctor recommends. Ask your doctor about a flu shot and a pneumonia shot.  Use oxygen therapy and pulmonary rehabilitation if told by your doctor. If you need home oxygen therapy, ask your doctor if you should buy a tool to measure your oxygen level (oximeter).  Make a COPD action plan with your doctor. This helps you to know what to do if you feel worse than usual.  Manage any other conditions you have as told by your doctor.  Avoid going outside when it  is very hot, cold, or humid.  Avoid people who have a sickness you can catch (contagious).  Keep all follow-up visits as told by your doctor. This is important. Contact a doctor if:  You cough up more mucus than usual.  There is a change in the color or thickness of the mucus.  It is harder to breathe than usual.  Your breathing is faster than usual.  You have trouble sleeping.  You need  to use your medicines more often than usual.  You have trouble doing your normal activities such as getting dressed or walking around the house. Get help right away if:  You have shortness of breath while resting.  You have shortness of breath that stops you from: ? Being able to talk. ? Doing normal activities.  Your chest hurts for longer than 5 minutes.  Your skin color is more blue than usual.  Your pulse oximeter shows that you have low oxygen for longer than 5 minutes.  You have a fever.  You feel too tired to breathe normally. Summary  Chronic obstructive pulmonary disease (COPD) is a long-term lung problem.  The way your lungs work will never return to normal. Usually the condition gets worse over time. There are things you can do to keep yourself as healthy as possible.  Take over-the-counter and prescription medicines only as told by your doctor.  If you smoke, stop. Smoking makes the problem worse. This information is not intended to replace advice given to you by your health care provider. Make sure you discuss any questions you have with your health care provider. Document Revised: 05/15/2017 Document Reviewed: 07/07/2016 Elsevier Patient Education  2020 [employer].

## 2019-10-05 NOTE — Assessment & Plan Note (Signed)
-  Continue oxygen 3 liters as needed during the day/night to maintain Oxygen sats 88% or greater. - Patient currently is using continuously for patient comfort

## 2019-10-05 NOTE — Telephone Encounter (Signed)
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 Needs to change to nebulized ICS/LABA. She is currently in [facility] rehab facility.  Can you please help with benefit investigation and possibly televisit with patient. She was reluctant to changing but she does report having a very hard time using hfa inhaler and does not like DPI overall I think it will greatly improve her COPD.

## 2019-10-05 NOTE — Assessment & Plan Note (Addendum)
-    Due to patient not able to use effectively; discontinued Symbicort 160 mcg inhaler and changed to Pulmicort 0.5 mg nebulizer two time a day (rinse after use) and Brovana 15 mcg nebulizer two time a day. Patient is to notify office if any problems with medication changes (Pharmacy to help with benefits investigation) - Continue Spirvia Respimat 2 puffs Daily - Continue Albuterol nebulizer or inhaler every 4 hours as needed - Continue Flutter valve as needed for chest congestion - Please notify office if having any issues receiving a nebulizer machine or flutter valve - Follow up with Dr. Melvyn Novas in 3 months

## 2019-10-07 NOTE — Telephone Encounter (Signed)
Ran test claim for Brovana through Medicare part B- Copay was $134.54. Unable to process claim for Perforomist due to claim asking for nebulizer information.  Patient has secondary insurance that should pick up most, if not all of her Medicare part B copays.

## 2019-10-10 ENCOUNTER — Ambulatory Visit: Payer: Medicare Other | Admitting: Family Medicine

## 2019-10-10 NOTE — Telephone Encounter (Signed)
Called and spoke with pt letting her know the info stated by [facility] and also from pharmacy team. Pt stated that she will give the nebulized meds a try.

## 2019-10-10 NOTE — Telephone Encounter (Signed)
Thanks!

## 2019-10-10 NOTE — Telephone Encounter (Signed)
Called to discuss with patient.  She states she is getting nebulized medications in the rehab facility.  She states she does not want to pay a co-pay and would rather die.  Advised she would most likely not have to pay a co-pay as she has Medicare Part B and a supplement.    Patient asked if Dr. Melvyn Novas was aware of recommendations and advised that he was.  She stated that I did not know her or what was going on.  Advised she has a follow up appointment on 7/20 with Dr. Melvyn Novas.  Instructed patient to continue nebulized medication while in the rehab facility and call our office when she is finished with rehab to discuss respiratory medication regimen.  Patient verbalized understanding.  All questions encouraged and answered.     Mariella Saa, PharmD, [place], [place] Clinical Specialty Pharmacist [phone]  10/10/2019 12:49 PM

## 2019-10-10 NOTE — Telephone Encounter (Signed)
She was not effectively able to use her Symbicort inhaler in office. She expressed difficulty using inhaler and needing someone to help her. She was unable to use it on her own in the office. She does not like dry powder inhalers. Recommended we change Symbicort to nebulized medications. Referred to pharamcy to help with benefits investigation. If she does not want to follow through with these recommendations then just resume Sym/Spiriva and follow-up with Dr. Melvyn Novas in July.

## 2019-10-19 ENCOUNTER — Encounter: Payer: Self-pay | Admitting: Family Medicine

## 2019-10-19 ENCOUNTER — Ambulatory Visit (INDEPENDENT_AMBULATORY_CARE_PROVIDER_SITE_OTHER): Payer: Medicare Other | Admitting: Family Medicine

## 2019-10-19 ENCOUNTER — Other Ambulatory Visit: Payer: Self-pay

## 2019-10-19 DIAGNOSIS — M25521 Pain in right elbow: Secondary | ICD-10-CM

## 2019-10-19 DIAGNOSIS — M79641 Pain in right hand: Secondary | ICD-10-CM

## 2019-10-19 MED ORDER — COLCHICINE 0.6 MG PO CAPS: 1.0000 | ORAL_CAPSULE | Freq: Two times a day (BID) | ORAL | 3 refills | Status: DC | PRN

## 2019-10-19 NOTE — Progress Notes (Signed)
Office Visit Note   Patient: [name]           Date of Birth: 11/23/1945           MRN: [id number] Visit Date: 10/19/2019 Requested by: Christain Sacramento, MD [id number] [place],  [place] [address] PCP: Christain Sacramento, MD  Subjective: Chief Complaint  Patient presents with  . Right Hand - Pain    Fingers II-IV - painful and stay flexed - in a splint from the facility where she lives. Appointment with rheumatologist is not until 12/10/19.  . Right Elbow - Pain    Pain with certain movements. Has fluid on the elbow.    HPI: She is here with right elbow pain and right hand pain.  Hand is the same, no improvement.  She is wearing a splint which gives her temporary relief.  Her elbow started to swell recently and that her shoulder is bothering her as well.  Her rheumatology consult is in June.              ROS:   All other systems were reviewed and are negative.  Objective: Vital Signs: There were no vitals taken for this visit.  Physical Exam:  General:  Alert and oriented, in no acute distress. Pulm:  Breathing unlabored. Psy:  Normal mood, congruent affect. Skin: No erythema Right elbow: She has 1+ swelling of the olecranon bursa with no warmth.  No significant tenderness.  No intra-articular effusion. Right hand: She continues to have stiffness and tenderness around the MCP joints.   Imaging: No results found.  Assessment & Plan: 1.  Aseptic right elbow olecranon bursitis  2.  Possible rheumatoid arthritis causing right hand pain -We will try colchicine. -Await rheumatology consult.     Procedures: No procedures performed  No notes on file     PMFS History: Patient Active Problem List   Diagnosis Date Noted  . Need for vaccination 10/05/2019  . Rheumatoid factor positive 09/19/2019  . 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 Protein-calorie malnutrition, severe 08/20/2019  . Severe sepsis ([place]) 08/19/2019  . Acute lower UTI 08/19/2019  . Pressure  injury of skin 08/19/2019  . ARF (acute renal failure) ([place]) 08/18/2019  . Hyperkalemia 08/18/2019  . Transaminitis 08/18/2019  . Generalized weakness 08/18/2019  . Leukocytosis 08/18/2019  . Anemia 08/18/2019  . Acute renal failure (ARF) ([place]) 08/18/2019  . Chronic respiratory failure with hypoxia and hypercapnia ([place]) 06/02/2018  . COPD with acute exacerbation ([place]) 05/09/2018  . Hyponatremia 05/09/2018  . Acute urinary retention 05/09/2018  . Cystocele with prolapse 03/02/2018  . Cor pulmonale, chronic ([place]) 06/29/2017  . Chronic respiratory failure with hypoxia ([place]) 06/30/2014  . Pulmonary infiltrates 01/26/2013  . Nodule of right lung 08/19/2012  . Cancer screening 05/12/2012  . COPD exacerbation ([place]) 05/12/2012  . Essential hypertension 03/28/2012  . COPD GOLD III 12/02/2011  . Chronic cough 12/02/2011  . Cigarette smoker 12/02/2011   Past Medical History:  Diagnosis Date  . Abrasion    under left knee x 2 places changing dressing q day of bid, applying ssd cream area healing due to fell 2 weeks ago  . Blood dyscrasia    BLEEDS EASY  . Bruises easily    bleeds easily  . Closed patellar sleeve fracture of left knee 01/04/2018  . Closed patellar sleeve fracture of right knee 01/04/2018   healing   . COPD (chronic obstructive pulmonary disease) ([facility])   . Coronary artery disease  per dr Philis Kendall note 01-01-18 note  . Hernia, inguinal    LEFT  . Hypertension    states under control with meds., has been on med. > 10 yr.  . On home oxygen therapy    at night 2L/min  . Pneumonia    "SEVERAL TIMES IN PAST, LAST TIME 2018"  . Pulmonary hypertension ([place])    per dr Terrence Dupont 7-19 19 lov note  . Shortness of breath dyspnea    with exertion  . SVD (spontaneous vaginal delivery)    x 1  . Thin skin   . Urinary tract infection 2019  . Wears partial dentures    lower partial and upper plate    Family History  Problem Relation Age of Onset  . Heart disease Father     . Kidney failure Father   . Colon cancer Neg Hx   . Esophageal cancer Neg Hx     Past Surgical History:  Procedure Laterality Date  . COLONOSCOPY     Less than 10 years ago per patient   . CYSTOCELE REPAIR N/A 03/02/2018   Procedure: ANTERIOR REPAIR (CYSTOCELE);  Surgeon: Cheri Fowler, MD;  Location: [facility];  Service: Gynecology;  Laterality: N/A;  OUTPATIENT IN BED  . MULTIPLE TOOTH EXTRACTIONS    . RESECTION DISTAL CLAVICAL Left 10/12/2014   Procedure: DISTAL CLAVICLE EXCISION;  Surgeon: Kathryne Hitch, MD;  Location: [facility];  Service: Orthopedics;  Laterality: Left;  . SHOULDER ARTHROSCOPY WITH ROTATOR CUFF REPAIR AND SUBACROMIAL DECOMPRESSION Left 10/12/2014   Procedure: LEFT SHOULDER ARTHROSCOPY, DEBRIDEMENT WITH ACROMIOPLASTY,  ROTATOR CUFF REPAIR AND RELEASE BICEPS TENDON;  Surgeon: Kathryne Hitch, MD;  Location: [facility];  Service: Orthopedics;  Laterality: Left;   Social History   Occupational History  . Occupation: weaver  Tobacco Use  . Smoking status: Former Smoker    Packs/day: 1.00    Years: 57.00    Pack years: 57.00    Types: Cigarettes    Quit date: 03/20/2017    Years since quitting: 2.5  . Smokeless tobacco: Never Used  . Tobacco comment: <1ppd 02/05/2016  Substance and Sexual Activity  . Alcohol use: Yes    Alcohol/week: 28.0 standard drinks    Types: 14 Cans of beer, 14 Shots of liquor per week    Comment: several beers/day and "a shot of whiskey"  . Drug use: No  . Sexual activity: Not Currently    Birth control/protection: Post-menopausal

## 2019-10-28 ENCOUNTER — Telehealth: Payer: Self-pay | Admitting: Internal Medicine

## 2019-10-28 DIAGNOSIS — J449 Chronic obstructive pulmonary disease, unspecified: Secondary | ICD-10-CM

## 2019-10-28 NOTE — Telephone Encounter (Signed)
ATC patient just rang and rang will try again later

## 2019-10-31 MED ORDER — ARFORMOTEROL TARTRATE 15 MCG/2ML IN NEBU: 15.0000 ug | INHALATION_SOLUTION | Freq: Two times a day (BID) | RESPIRATORY_TRACT | 6 refills | Status: DC

## 2019-10-31 MED ORDER — BUDESONIDE 0.5 MG/2ML IN SUSP: 0.5000 mg | Freq: Two times a day (BID) | RESPIRATORY_TRACT | 6 refills | Status: DC

## 2019-10-31 NOTE — Telephone Encounter (Signed)
Patient is returning phone call. Patient phone number is [phone].

## 2019-10-31 NOTE — Telephone Encounter (Signed)
Spoke with the pt  She is calling stating that her brovana and pulmicort are going to cost too much and pharm is having a problem filling them  I noticed that meds were sent without dx code  I have re sent them with dx code included  Called and asked spoke with pharmacist at [facility]  They did need the dx  They received the rxs I sent with dx code and will file this under part B  Will call if any other issues  Nothing further needed Pt aware

## 2019-10-31 NOTE — Telephone Encounter (Signed)
ATC, NA and no option to leave msg

## 2019-11-02 ENCOUNTER — Telehealth: Payer: Self-pay | Admitting: Internal Medicine

## 2019-11-02 ENCOUNTER — Telehealth: Payer: Self-pay | Admitting: *Deleted

## 2019-11-02 NOTE — Telephone Encounter (Signed)
Spoke with pt letting her know that MW said she could go back on symb instead of the neb sol. She stated she has an upcoming appt with MW that she will discuss this with him at that appt. Nothing further needed.

## 2019-11-02 NOTE — Telephone Encounter (Signed)
Called and spoke with pt who states her two neb sol are going to cost her around $194. Pt wanted to know if there was anything else she could do. Pt stated she would discuss this with MW at upcoming appt. Nothing further needed.

## 2019-11-02 NOTE — Telephone Encounter (Signed)
Record indicates she's on brovana/bud neb which is the same thing. Ok to fill symbicort rx if not on these meds still but her med list needs updating

## 2019-11-03 ENCOUNTER — Telehealth: Payer: Self-pay | Admitting: Internal Medicine

## 2019-11-03 NOTE — Telephone Encounter (Signed)
ATC pt, received a busy signal x2. °Will try back. °

## 2019-11-04 MED ORDER — BUDESONIDE-FORMOTEROL FUMARATE 160-4.5 MCG/ACT IN AERO: 2.0000 | INHALATION_SPRAY | Freq: Two times a day (BID) | RESPIRATORY_TRACT | 11 refills | Status: DC

## 2019-11-04 NOTE — Telephone Encounter (Signed)
Ok if not using neb can go backto prior rx symbicort with year's worth of refills

## 2019-11-04 NOTE — Telephone Encounter (Signed)
Spoke with the pt and notified of response per Dr Melvyn Novas and she verbalized understanding. Rx was sent to pharm for symbicort.

## 2019-11-04 NOTE — Telephone Encounter (Signed)
Spoke with the pt  She is asking to have her symbicort refilled  Last ov with Beth she was switched to pulmicort and brovana but these will cost her around$200  She has appt with MW 5/24  Please advise thanks

## 2019-11-07 ENCOUNTER — Ambulatory Visit (INDEPENDENT_AMBULATORY_CARE_PROVIDER_SITE_OTHER): Payer: Medicare Other | Admitting: Internal Medicine

## 2019-11-07 ENCOUNTER — Encounter: Payer: Self-pay | Admitting: Internal Medicine

## 2019-11-07 ENCOUNTER — Other Ambulatory Visit: Payer: Self-pay

## 2019-11-07 DIAGNOSIS — J9612 Chronic respiratory failure with hypercapnia: Secondary | ICD-10-CM

## 2019-11-07 DIAGNOSIS — J9611 Chronic respiratory failure with hypoxia: Secondary | ICD-10-CM | POA: Diagnosis not present

## 2019-11-07 DIAGNOSIS — J449 Chronic obstructive pulmonary disease, unspecified: Secondary | ICD-10-CM | POA: Diagnosis not present

## 2019-11-07 DIAGNOSIS — F17210 Nicotine dependence, cigarettes, uncomplicated: Secondary | ICD-10-CM | POA: Diagnosis not present

## 2019-11-07 MED ORDER — BREZTRI AEROSPHERE 160-9-4.8 MCG/ACT IN AERO: 2.0000 | INHALATION_SPRAY | Freq: Two times a day (BID) | RESPIRATORY_TRACT | 0 refills | Status: DC

## 2019-11-07 MED ORDER — PREDNISONE 10 MG PO TABS: ORAL_TABLET | ORAL | 11 refills | Status: DC

## 2019-11-07 NOTE — Progress Notes (Signed)
Subjective:   Patient ID: [name], female    DOB: 1946/05/09   MRN: [id number]  Brief patient profile:  23 yowf MM Active smoker   followed in pulmonary clinic for GOLD III copd   History of Present Illness  11/05/2015  f/u ov/Wert re: GOLD III / still smoking / maint rx symb/spriiva dpi  Chief Complaint  Patient presents with  . Follow-up    pt states breathing is baseline. c/o cont SOB, prod cough tan to greenish in color, wheezing, cp/tightness  dyspnea is variable and has portable tanks but never uses them to see if makes any difference in her variable doe / using saba up to 4 x daily instead.   rec You are qualified for portable 02 if you wish but no need to wear it at rest or walking around the house - continue it at bedtime thru the concentrator at 2lpm and wear the portable system as needed during the day Ok to finish up the powder spiriva and just refill the respimat    12/24/2016  f/u ov/Wert re:  COPD GOLD III/ still smoking/ symb/spiriva / 02 2lpm hs only  Chief Complaint  Patient presents with  . Follow-up    Pt states she is still having increase sob with exerton, she still has a slight cough with clear mucus with a tint of blood, lots of wheezing,Denies chest tightness,fever   using saba hfa up to twice daily but no needing noct Doe = MMRC3 = can't walk 100 yards even at a slow pace at a flat grade s stopping due to sob   Has slt bloody nasal discharge in am and scant blood streaked sputum also, just in am and just when nose is bleeding too    11/11/2017  f/u ov/Wert re:  GOLD III/ 02 hs prn  Chief Complaint  Patient presents with  . Follow-up    Breathing is about the same. She is using her albuterol inhaler 2-3 x per wk on average and she rarely uses neb.   Dyspnea:   No change in doe = MMRC3  Cough: not much at all  Sleep: flat on 2lpm  SABA use:  As above Sleeping  Ok on 2lpm flat   rec Discuss the fluid pills with  Dr Redmond Pulling but I will recommend he  consider adding aldactone if not improving on lasix alone but have to be very careful with potassium monitoring with these changes so I will defer to him  Please schedule a follow up visit in 6 months but call sooner if needed - referred to rehab     05/03/18 acute ov/ NP aecopd rec Seen  for COPD exacerbation secondary to URI Office treatment: Xopenex neb treatment x1 today Rx: Prednisone taper as prescribed Z-Pak as prescribed Recommendations: Used Albuterol nebulizer every 6 hours as needed for shortness of breath/wheezing for the next 3 to 5 days until better You can return to work as long as you are afebrile for 24 hours Stay well-hydrated, encourage 4-6 glass of fluids (8 oz)    Admit date: 05/08/2018 Discharge date: 05/20/2018   Discharge Diagnoses:      Active Hospital Problems   Diagnosis Date Noted  . COPD with acute exacerbation ([place]) 05/09/2018  . Hyponatremia 05/09/2018  . Acute urinary retention 05/09/2018  . Chronic respiratory failure with hypoxia ([place]) 06/30/2014  . Essential hypertension 03/28/2012    History of present illness:  Crystal Pellecchia [name] a 74 y.o.femalewith medical history significant forCOPD  with chronic hypoxic respiratory failure, hypertension, and chronic bilateral leg swelling and ulcerations, now presenting to the emergency department for evaluation of shortness of breath and productive cough. Patient was seen in her pulmonology clinic on 05/03/2018 with productive cough and increased dyspnea, was diagnosed with acute exacerbation in COPD and started on prednisone and azithromycin. She failed to improve, was seen back on 05/06/2018 and reports that her prednisone course was extended. Unfortunately, she still has not had any significant improvement and may actually be worsening. She has been dyspneic at rest for the past couple days and has had difficulty sleeping due to this. She denies fevers, chills, or chest pain. Reports that her  chronic bilateral lower extremity swelling is not as bad as usual. Pt admitted for further management   Today, pt reported feeling better, was able to ambulate with PT, with noted desaturations requiring home O2. Pt advised to use home O2 constantly (at rest and ambulation), not just at bedtime. Pt discharged on a long taper of steroids and advised to follow up closely with PCP and pulmonary. Stable for d/c with [facility] Course:  Principal Problem:   COPD with acute exacerbation ([place]) Active Problems:   Essential hypertension   Chronic respiratory failure with hypoxia ([facility])   Hyponatremia   Acute urinary retention  Acute on chronic hypoxic respiratory failure with COPD Exacerbation -Presented with worsening shortness of breath, cough despite being treated with prednisone and azithromycin since 05/03/2018 - long hospital stay with slow improvement, admitted on 11/23 and required BIPAP at admission, and has completed course of abx with levaquin on 11/27. She's been transitioned from IV solumedrol to PO prednisone -Continue duoneb, inhaler, tapered dose of prednisone -Completed course of Levaquin  -Continues to require supplemental O2 (3 L now, was previously on 2 L at night only) -CXR 11/30, showing improvement -Follow up with PCP and Dr Melvyn Novas (pulmonary)  Hyponatremia -resolved  Essential hypertension -Continue diltiazem, losartan  Alcohol use -Continue folic acid, thiamine, multivitamin   Leukocytosis  -likely due to steroids  Hyperglycemia -no history of diabetes  -hemoglobin A1c 6.1  Deconditioning -secondary to respiratory status/illness -PT evaluated patient, no further needs -OT recommended [facility]     06/01/2018  Post hosp f/u ov/Wert re: transition of care:   Aecopd/ 02 dep 24/7  Chief Complaint  Patient presents with  . Hospitalization Follow-up    Breathing has improved some but not back to her normal baseline. She is using her o2 3lpm  24/7.  She has not used her albuterol inhaler but has been using her albuterol neb once daily on average.   Dyspnea:  MMRC4  = sob if tries to leave home or while getting dressed   Cough: some rattling  Sleeping: horizontal on back ok SABA use: avg neb once  02: 3lpm 24/7 rec 3lpm at bedtime, none at rest and turn up your 02 to keep it above 90% with activity  Plan A = Automatic = symbicort and spiriva  Plan B = Backup Only use your albuterol inhaler as a rescue medication   Plan C = Crisis - only use your albuterol nebulizer if you first try Plan B and it fails to help > ok to use the nebulizer up to every 4 hours but if start needing it regularly call for immediate appointment For cough/ congestion >  mucinex or mucinex dm up to 1200 mg every 12 hours with flutter valve  Work on inhaler technique:  If not  improving >  Prednisone 10 mg take  4 each am x 2 days,   2 each am x 2 days,  1 each am x 2 days and stop         08/11/2018  Acute ov/Wert re:  Copd GOLD III/ cor pulmonale:  symb 160 /spiriva / 02 hs and prn day Has pred as plan D but confused when and how to use it  Chief Complaint  Patient presents with  . Acute Visit    runny nose- min green nasal d/c and increased cough- started last night. She is feeling more SOB today.   Dyspnea:  No increase Cough: more congested in am and turning slt more yellow on day of ov  Sleeping: bed is flat/ one pillow SABA use: not using at all despite  02: 3lpm hs and as needed during the day but not titrating as rec  rec  For increased / wheezing/ cough or need for albuterol > prednisone 10 mg x 2 until better then 1 daily x 3 days and stop Monitor your 02 level with exertion with goal of keeping over 90%     01/12/2019  f/u ov/Wert re: GOLD III/ cor pulmonale maint on symb/spiriva  Chief Complaint  Patient presents with  . Follow-up    F/U DJ:[facility]FW. She reports her breathing has not been well with the hot weather. Currenlty taking  Keflex for left leg cellulitis and knee infection.   Dyspnea:  Not going out much / room to room ok Cough: still has "smoker's rattle" cough worse in am >  thick white 1-2 tsp Sleeping: bed is flat, one pillow SABA use: sev times a day when overdoes it  02: 3lpm hs and prn daytime  rec For increased / wheezing/ cough or need for albuterol > prednisone 10 mg x 2 until better then 1 daily x 3 days and stop Monitor your 02 level with exertion with goal of keeping over 90%    Admitted to [facility] > snf p out around 10/21/19   11/07/2019  f/u ov/Wert re: last smoked one day prior/ thoroughly confused with details of care   Chief Complaint  Patient presents with  . Follow-up    pt is having sob using rescue inhaler 2 to 3 times aweek  Dyspnea:  Across the room gives out on 2-3 lpm but does not check sats walking  Cough: no Sleeping: still on couch with one pillow  SABA use: as above  02: 2-3lpm    No obvious day to day or daytime variability or assoc excess/ purulent sputum or mucus plugs or hemoptysis or cp or chest tightness, subjective wheeze or overt sinus or hb symptoms.   Sleeping as above  without nocturnal  or early am exacerbation  of respiratory  c/o's or need for noct saba. Also denies any obvious fluctuation of symptoms with weather or environmental changes or other aggravating or alleviating factors except as outlined above   No unusual exposure hx or h/o childhood pna/ asthma or knowledge of premature birth.  Current Allergies, Complete Past Medical History, Past Surgical History, Family History, and Social History were reviewed in [employer] record.  ROS  The following are not active complaints unless bolded Hoarseness, sore throat, dysphagia, dental problems, itching, sneezing,  nasal congestion or discharge of excess mucus or purulent secretions, ear ache,   fever, chills, sweats, unintended wt loss or wt gain, classically pleuritic or exertional cp,   orthopnea pnd or arm/hand  swelling  or leg swelling, presyncope, palpitations, abdominal pain, anorexia, nausea, vomiting, diarrhea  or change in bowel habits or change in bladder habits, change in stools or change in urine, dysuria, hematuria,  rash, arthralgias, visual complaints, headache, numbness, weakness or ataxia or problems with walking/ in w/c or coordination,  change in mood or  memory.        Current Meds-  - NOTE:   Unable to verify as accurately reflecting what pt takes     Medication Sig  . albuterol (PROVENTIL) (2.5 MG/3ML) 0.083% nebulizer solution Take 3 mLs (2.5 mg total) by nebulization every 6 (six) hours as needed for wheezing or shortness of breath.  Marland Kitchen arformoterol (BROVANA) 15 MCG/2ML NEBU Take 2 mLs (15 mcg total) by nebulization 2 (two) times daily.  . Ascorbic Acid (VITAMIN C WITH ROSE HIPS) 500 MG tablet Take 500 mg by mouth daily.  . budesonide (PULMICORT) 0.5 MG/2ML nebulizer solution Take 2 mLs (0.5 mg total) by nebulization 2 (two) times daily.  . budesonide-formoterol (SYMBICORT) 160-4.5 MCG/ACT inhaler Inhale 2 puffs into the lungs 2 (two) times daily.  Marland Kitchen buPROPion (WELLBUTRIN SR) 150 MG 12 hr tablet Take 150 mg by mouth daily.   . cholecalciferol (VITAMIN D) 1000 UNITS tablet Take 1,000 Units by mouth daily.  . Colchicine 0.6 MG CAPS Take 1 tablet by mouth 2 (two) times daily as needed.  . diltiazem (CARDIZEM CD) 360 MG 24 hr capsule Take 360 mg by mouth daily.   . furosemide (LASIX) 20 MG tablet Take 40 mg by mouth daily as needed for fluid.   . Garlic 378 MG TABS Take 1 tablet by mouth daily.  Marland Kitchen HYDROcodone-acetaminophen (NORCO) 7.5-325 MG tablet Take 1 tablet by mouth every 4 (four) hours.  . hydrOXYzine (ATARAX/VISTARIL) 25 MG tablet Take 25 mg by mouth 3 (three) times daily as needed for anxiety.  Marland Kitchen losartan (COZAAR) 25 MG tablet Take 1 tablet (25 mg total) by mouth daily.  . Multiple Minerals-Vitamins (CAL MAG ZINC +D3 PO) Take 1 tablet by mouth at  bedtime.  . ondansetron (ZOFRAN) 4 MG tablet Take 2-4 mg by mouth every 6 (six) hours as needed for nausea/vomiting.  . OXYGEN Inhale 3 L into the lungs. 24/7 DME- [employer]  . potassium chloride (K-DUR) 10 MEQ tablet Take 10 mEq by mouth daily as needed (swelling). When takes lasix   . Respiratory Therapy Supplies (FLUTTER) DEVI Use as directed  . spironolactone (ALDACTONE) 25 MG tablet Take 1 tablet by mouth 2 (two) times daily.  . Tiotropium Bromide Monohydrate (SPIRIVA RESPIMAT) 2.5 MCG/ACT AERS INHALE 2 PUFFS INTO THE LUNGS EVERY MORNING (Patient taking differently: Inhale 2 puffs into the lungs every morning. )                   Objective:  Physical Exam   w/c bound elderly wf nad at rest     11/07/2019    100 01/12/2019     110  09/26/2013  101>102 10/11/2013 > 01/16/2014  99 > 04/06/2014  103 >105 06/30/2014 > 08/11/2014  101 > 11/09/2014  104 > 01/22/2015 106  > 02/06/2015 105 > 03/08/2015 106 > 04/18/2015  106 > 06/15/2015 103 > 09/14/2015   102 > 10/08/2015   103 > 11/05/2015  102 > 02/05/2016  103 > 05/14/2016 102 > 09/24/2016  99 > 12/24/2016  99 > 06/29/2017  104 > 09/28/2017  106 > 11/11/2017 104 > 02/03/2018  107 > 05/06/2018   105 > 06/01/2018  111> 07/14/2018  110 > 08/11/2018   109    Vital signs reviewed  11/07/2019  - Note at rest 02 sats  94% on 3lpm      HEENT : pt wearing mask not removed for exam due to covid -19 concerns.    NECK :  without JVD/Nodes/TM/ nl carotid upstrokes bilaterally   LUNGS: no acc muscle use,  Mod barrel  contour chest wall with bilateral  Distant bs s audible wheeze and  without cough on insp or exp maneuvers and mod  Hyperresonant  to  percussion bilaterally     CV:  RRR  no s3 or murmur or increase in P2, and no edema   ABD:  soft and nontender with pos mid insp Hoover's .  No bruits or organomegaly appreciated, bowel sounds nl  MS:   ext warm without deformities, calf tenderness, cyanosis or clubbing No obvious joint restrictions   SKIN: warm and  dry without lesions    NEURO:  alert, approp, nl sensorium with  no motor or cerebellar deficits apparent.         I personally reviewed images and agree with radiology impression as follows:  CXR:   PA and Lat 08/18/19 COPD. Bibasilar scarring.                       Assessment & Plan:

## 2019-11-07 NOTE — Patient Instructions (Addendum)
Plan A = Automatic = Always=    Breztri Take 2 puffs first thing in am and then another 2 puffs about 12 hours later (do not use symbicort / spiriva)   Work on inhaler technique:  relax and gently blow all the way out then take a nice smooth deep breath back in, triggering the inhaler at same time you start breathing in.  Hold for up to 5 seconds if you can. Blow out thru nose. Rinse and gargle with water when done      Plan B = Backup (to supplement plan A, not to replace it) Only use your albuterol inhaler as a rescue medication to be used if you can't catch your breath by resting or doing a relaxed purse lip breathing pattern.  - The less you use it, the better it will work when you need it. - Ok to use the inhaler up to 2 puffs  every 4 hours if you must but call for appointment if use goes up over your usual need - Don't leave home without it !!  (think of it like the spare tire for your car)   Plan C = Crisis (instead of Plan B but only if Plan B stops working) - only use your albuterol nebulizer if you first try Plan B and it fails to help > ok to use the nebulizer up to every 4 hours but if start needing it regularly call for immediate appointment  Plan D = Deltasone - Prednisone 10 mg take  4 each am x 2 days,   2 each am x 2 days,  1 each am x 2 days and stop   I will see if we can resubmit your formoterol(brovana or performist)  and budesonide and yupelri thru [place] (to take the  place of breztri (three)   = symbicort/spiriva (two plus one)   Please schedule a follow up visit in 3 months but call sooner if needed

## 2019-11-08 ENCOUNTER — Encounter: Payer: Self-pay | Admitting: Internal Medicine

## 2019-11-08 NOTE — Assessment & Plan Note (Signed)
Counseled re importance of smoking cessation but did not meet time criteria for separate billing            Each maintenance medication was reviewed in detail including emphasizing most importantly the difference between maintenance and prns and under what circumstances the prns are to be triggered using an action plan format where appropriate.  Total time for H and P, chart review, counseling,  and generating customized AVS unique to this office visit / charting = 20 min

## 2019-11-08 NOTE — Assessment & Plan Note (Signed)
Active smoker  pfts 11/2011   FEV1 0.86 (42%) ratio 35  - alpha one AT screening 03/24/12   MM  Level 166  - 09/14/2015  hfa Arlana Pouch 11/05/2015 (when spiriva dpi supply runs out) - flutter valve added 02/05/2016  And recoached 05/14/2016  - PFT's  06/29/2017  FEV1 0.92 (44 % ) ratio 44  p 11 % improvement from saba p symb/spiriva prior to study with DLCO  33/33 % corrects to 35 % for alv volume   - 11/11/2017 referred to rehab >   did not go 05/03/2018 - COPD exac d/t URI, RX zpack and prednisone taper > failed outpt rx and admitted  - 08/11/2018 added pred as plan D  - 11/07/2019  After extensive coaching inhaler device,  effectiveness =    75% from baseline 25%> ok to try breztri 2 bid sample for now but ideally needs to be on laba/lama/ics per neb per [facility]     Group D in terms of symptom/risk and laba/lama/ICS  therefore appropriate rx at this point   Multiple issues related to adherence addressed today but mostly pyschosocial in nature in pt poorly motivated to stop all smoking and take meds as directed with options being triple rx by breztri or trelegy vs symb/spiriva (no samples to offer)  vs neb for which may be covered 100% so this is the option I favor.

## 2019-11-08 NOTE — Assessment & Plan Note (Signed)
Exertional desats 06/30/2014 >O2 2lm rx with act  - 11/09/2014  Walked RA  2 laps @ 185 ft each stopped due to  Sat down to 88 % at nl pace but no sob  - ono RA  09/22/15  desat < 89% x 1:67mn > rec continue 02 2lpm - 45/75/0518certification: Patient Saturations on Room Air at Rest = 94% and while Ambulating 2 laps x 185 each= 87% Patient Saturations on 2Liters of oxygen while Ambulating =96% x one additional lap - 11/05/2015  Walked RA x 4 laps @ 185 ft each stopped due to  desat to 87% resolved on 2lpm   - 06/29/2017  Walked RA x 3 laps @ 185 ft each stopped due to  End of study/ slow pace, no desat - 11/11/2017  Walked RA x 3 laps @ 185 ft each stopped due to  End of study,  no  desat  < 90% at moderate to moderately  fast pace   - HCO3  05/20/18  = 34    - 07/14/2018 Room Air at Rest = 96% Patient Saturations on Room Air while Ambulating = 88% Patient Saturations on 3 Liters of oxygen while Ambulating = 95% - ono on 2lpm completed 07/20/18  desat x 132 min so 07/22/2018 rec  repeat ono on 3lpm> done 07/30/18 on 3lpm = 10.6 m at < 89% so ok to continue 3lpm hs  -    As of 11/07/2019  3lpm hs  And 3lpm walking more than around the house with goal of > 90% at all times due to cor pulmonale

## 2019-11-09 ENCOUNTER — Ambulatory Visit: Payer: Medicare Other | Admitting: Dermatology

## 2019-11-21 ENCOUNTER — Other Ambulatory Visit: Payer: Self-pay | Admitting: Orthopedic Surgery

## 2019-11-21 DIAGNOSIS — M24541 Contracture, right hand: Secondary | ICD-10-CM

## 2019-11-28 ENCOUNTER — Telehealth: Payer: Self-pay | Admitting: Internal Medicine

## 2019-11-28 MED ORDER — REVEFENACIN 175 MCG/3ML IN SOLN: 175.0000 ug | Freq: Every day | RESPIRATORY_TRACT | 11 refills | Status: DC

## 2019-11-28 MED ORDER — FORMOTEROL FUMARATE 20 MCG/2ML IN NEBU: 20.0000 ug | INHALATION_SOLUTION | Freq: Two times a day (BID) | RESPIRATORY_TRACT | 11 refills | Status: DC

## 2019-11-28 MED ORDER — BUDESONIDE 0.5 MG/2ML IN SUSP: 0.5000 mg | Freq: Two times a day (BID) | RESPIRATORY_TRACT | 11 refills | Status: DC

## 2019-11-28 NOTE — Telephone Encounter (Signed)
Spoke with pt, states that at 11/07/19 visit with MW she was told that we would try to get her inhaled nebs through [facility].  See exerpt from 11/07/19 OV copied below:   I will see if we can resubmit your formoterol(brovana or performist)  and budesonide and yupelri thru [facility] (to take the  place of breztri (three)   = symbicort/spiriva (two plus one)  Per office note, these meds were not sent to [facility].  Orders sent per OV request.  Nothing further needed at this time- will close encounter.

## 2019-12-05 ENCOUNTER — Other Ambulatory Visit: Payer: Self-pay | Admitting: Internal Medicine

## 2019-12-07 ENCOUNTER — Telehealth: Payer: Self-pay | Admitting: *Deleted

## 2019-12-07 NOTE — Telephone Encounter (Signed)
Received fax from [facility] contacting us to let us know pt was scheduled for an appointment with Dr. Leigh Aurora on 12/06/19 and did not keep their appt.

## 2019-12-15 ENCOUNTER — Other Ambulatory Visit: Payer: Self-pay

## 2019-12-15 NOTE — Telephone Encounter (Signed)
Received a refill request for Spiriva respimat.  Per pt's chart, it looks like she had been switched from Spiriva to [place]. lmtcb X1 for pt to follow up on and see what inhaler she is taking.

## 2019-12-26 ENCOUNTER — Ambulatory Visit
Admission: RE | Admit: 2019-12-26 | Discharge: 2019-12-26 | Disposition: A | Discharge status: Home / Self Care | Payer: Medicare Other | Source: Ambulatory Visit | Attending: Orthopedic Surgery | Admitting: Orthopedic Surgery

## 2019-12-26 DIAGNOSIS — M24541 Contracture, right hand: Secondary | ICD-10-CM

## 2020-01-03 ENCOUNTER — Ambulatory Visit: Payer: Medicare Other | Admitting: Internal Medicine

## 2020-01-04 ENCOUNTER — Telehealth: Payer: Self-pay | Admitting: Internal Medicine

## 2020-01-04 NOTE — Telephone Encounter (Signed)
Patient is having cataract surgery tomorrow and wanted to know if it is okay if they use sudation on her for this procedure?   Dr. Melvyn Novas please advise?

## 2020-01-04 NOTE — Telephone Encounter (Signed)
Yes - this type of surgery/sedations is not a problem

## 2020-01-04 NOTE — Telephone Encounter (Signed)
Spoke with pt, aware of MW's recs.  Nothing further needed at this time- will close encounter.

## 2020-01-05 ENCOUNTER — Other Ambulatory Visit: Payer: Self-pay | Admitting: Orthopedic Surgery

## 2020-01-05 DIAGNOSIS — M24541 Contracture, right hand: Secondary | ICD-10-CM

## 2020-01-15 ENCOUNTER — Emergency Department (HOSPITAL_COMMUNITY): Payer: Medicare Other

## 2020-01-15 ENCOUNTER — Other Ambulatory Visit: Payer: Self-pay

## 2020-01-15 ENCOUNTER — Encounter (HOSPITAL_COMMUNITY): Payer: Self-pay

## 2020-01-15 DIAGNOSIS — I10 Essential (primary) hypertension: Secondary | ICD-10-CM | POA: Diagnosis not present

## 2020-01-15 DIAGNOSIS — Z79899 Other long term (current) drug therapy: Secondary | ICD-10-CM | POA: Diagnosis not present

## 2020-01-15 DIAGNOSIS — J449 Chronic obstructive pulmonary disease, unspecified: Secondary | ICD-10-CM | POA: Insufficient documentation

## 2020-01-15 DIAGNOSIS — R911 Solitary pulmonary nodule: Secondary | ICD-10-CM | POA: Insufficient documentation

## 2020-01-15 DIAGNOSIS — I2510 Atherosclerotic heart disease of native coronary artery without angina pectoris: Secondary | ICD-10-CM | POA: Diagnosis not present

## 2020-01-15 DIAGNOSIS — Z20822 Contact with and (suspected) exposure to covid-19: Secondary | ICD-10-CM | POA: Diagnosis not present

## 2020-01-15 DIAGNOSIS — R0781 Pleurodynia: Secondary | ICD-10-CM | POA: Diagnosis present

## 2020-01-15 DIAGNOSIS — Z87891 Personal history of nicotine dependence: Secondary | ICD-10-CM | POA: Diagnosis not present

## 2020-01-15 DIAGNOSIS — Z7951 Long term (current) use of inhaled steroids: Secondary | ICD-10-CM | POA: Diagnosis not present

## 2020-01-15 DIAGNOSIS — R0600 Dyspnea, unspecified: Secondary | ICD-10-CM | POA: Diagnosis not present

## 2020-01-15 LAB — CBC
HCT: 41.9 % (ref 36.0–46.0)
Hemoglobin: 13.1 g/dL (ref 12.0–15.0)
MCH: 27.6 pg (ref 26.0–34.0)
MCHC: 31.3 g/dL (ref 30.0–36.0)
MCV: 88.4 fL (ref 80.0–100.0)
Platelets: 306 10*3/uL (ref 150–400)
RBC: 4.74 MIL/uL (ref 3.87–5.11)
RDW: 16 % — ABNORMAL HIGH (ref 11.5–15.5)
WBC: 10.8 10*3/uL — ABNORMAL HIGH (ref 4.0–10.5)
nRBC: 0 % (ref 0.0–0.2)

## 2020-01-15 LAB — BASIC METABOLIC PANEL
Anion gap: 13 (ref 5–15)
BUN: 32 mg/dL — ABNORMAL HIGH (ref 8–23)
CO2: 28 mmol/L (ref 22–32)
Calcium: 9.7 mg/dL (ref 8.9–10.3)
Chloride: 100 mmol/L (ref 98–111)
Creatinine, Ser: 1.1 mg/dL — ABNORMAL HIGH (ref 0.44–1.00)
GFR calc Af Amer: 58 mL/min — ABNORMAL LOW (ref >60)
GFR calc non Af Amer: 50 mL/min — ABNORMAL LOW (ref >60)
Glucose, Bld: 116 mg/dL — ABNORMAL HIGH (ref 70–99)
Potassium: 4 mmol/L (ref 3.5–5.1)
Sodium: 141 mmol/L (ref 135–145)

## 2020-01-15 LAB — TROPONIN I (HIGH SENSITIVITY): Troponin I (High Sensitivity): 11 ng/L (ref <18)

## 2020-01-15 NOTE — ED Triage Notes (Signed)
Arrived POV, patient reports SHOB and rib pain when breathing in for past few days. Patient wears 3L O2 [place]. Patient in NAD

## 2020-01-15 NOTE — ED Notes (Signed)
Pt placed on hospital O2 cylinder at 3L

## 2020-01-16 ENCOUNTER — Emergency Department (HOSPITAL_COMMUNITY)
Admission: EM | Admit: 2020-01-16 | Discharge: 2020-01-16 | Disposition: A | Discharge status: Home / Self Care | Payer: Medicare Other | Attending: Emergency Medicine | Admitting: Emergency Medicine

## 2020-01-16 ENCOUNTER — Emergency Department (HOSPITAL_COMMUNITY): Payer: Medicare Other

## 2020-01-16 ENCOUNTER — Encounter (HOSPITAL_COMMUNITY): Payer: Self-pay | Admitting: Student

## 2020-01-16 DIAGNOSIS — R0600 Dyspnea, unspecified: Secondary | ICD-10-CM | POA: Diagnosis not present

## 2020-01-16 DIAGNOSIS — R918 Other nonspecific abnormal finding of lung field: Secondary | ICD-10-CM

## 2020-01-16 LAB — BRAIN NATRIURETIC PEPTIDE: B Natriuretic Peptide: 71 pg/mL (ref 0.0–100.0)

## 2020-01-16 LAB — SARS CORONAVIRUS 2 BY RT PCR (HOSPITAL ORDER, PERFORMED IN ~~LOC~~ HOSPITAL LAB): SARS Coronavirus 2: NEGATIVE

## 2020-01-16 LAB — TROPONIN I (HIGH SENSITIVITY): Troponin I (High Sensitivity): 11 ng/L (ref <18)

## 2020-01-16 MED ORDER — AMOXICILLIN-POT CLAVULANATE 875-125 MG PO TABS
1.0000 | ORAL_TABLET | Freq: Two times a day (BID) | ORAL | 0 refills | Status: DC
Start: 2020-01-16 — End: 2020-03-30

## 2020-01-16 MED ORDER — FENTANYL CITRATE (PF) 100 MCG/2ML IJ SOLN
50.0000 ug | Freq: Once | INTRAMUSCULAR | Status: AC
  Administered 2020-01-16: 50 ug via INTRAVENOUS
  Filled 2020-01-16: qty 2

## 2020-01-16 MED ORDER — IOHEXOL 350 MG/ML SOLN
100.0000 mL | Freq: Once | INTRAVENOUS | Status: AC | PRN
  Administered 2020-01-16: 100 mL via INTRAVENOUS

## 2020-01-16 MED ORDER — AZITHROMYCIN 250 MG PO TABS
500.0000 mg | ORAL_TABLET | Freq: Once | ORAL | Status: AC
  Administered 2020-01-16: 500 mg via ORAL
  Filled 2020-01-16: qty 2

## 2020-01-16 MED ORDER — AMOXICILLIN-POT CLAVULANATE 875-125 MG PO TABS
1.0000 | ORAL_TABLET | Freq: Once | ORAL | Status: AC
  Administered 2020-01-16: 1 via ORAL
  Filled 2020-01-16: qty 1

## 2020-01-16 MED ORDER — SODIUM CHLORIDE (PF) 0.9 % IJ SOLN
INTRAMUSCULAR | Status: AC
  Filled 2020-01-16: qty 50

## 2020-01-16 MED ORDER — AZITHROMYCIN 250 MG PO TABS
250.0000 mg | ORAL_TABLET | Freq: Every day | ORAL | 0 refills | Status: DC
Start: 2020-01-16 — End: 2020-03-30

## 2020-01-16 MED ORDER — LIDOCAINE 5 % EX PTCH
1.0000 | MEDICATED_PATCH | Freq: Every day | CUTANEOUS | 0 refills | Status: DC | PRN
Start: 2020-01-16 — End: 2020-08-07

## 2020-01-16 NOTE — Discharge Instructions (Addendum)
You were seen in the emergency department today for trouble breathing and upper back pain.  Your labs overall reassuring, your kidney function was somewhat elevated compared to prior, please have this rechecked by primary care provider within 3 days.  Your CT scan showed that you have 2 spots in your lungs which are pulmonary nodules, this may be due to pneumonia, therefore we are sending you home with Augmentin and azithromycin (antibiotics) to treat for pneumonia.  Please take these as prescribed.  We are also sending you home with Lidoderm patches to apply to your back once per day as needed for pain, remove within 12 hours.  Do not take your colchicine medicine with these antibiotics as this can lead to toxic blood levels of the colchicine.  We have prescribed you new medication(s) today. Discuss the medications prescribed today with your pharmacist as they can have adverse effects and interactions with your other medicines including over the counter and prescribed medications. Seek medical evaluation if you start to experience new or abnormal symptoms after taking one of these medicines, seek care immediately if you start to experience difficulty breathing, feeling of your throat closing, facial swelling, or rash as these could be indications of a more serious allergic reaction  It is also possible that these nodules are related to a cancerous process, for this reason it is extremely important that you have repeat evaluation of these areas within 3 months, this should be by CT scan, a PET scan, a bite sampling of the tissue.  Please discuss this with your primary care provider for arrangement.  We tested you for COVID-19, we will call you if these results are positive.    With your primary care provider in 3 days.  Return to the ER for new or worsening symptoms including but not limited to increased pain, fever, coughing up blood, passing out, chest pain, increased work of breathing, or any other  concerns.

## 2020-01-16 NOTE — ED Provider Notes (Addendum)
[facility] DEPT Provider Note   CSN: [id number] Arrival date & time: 01/15/20  1628     History Chief Complaint  Patient presents with  . Back Pain    [name] is a 74 y.o. female with a history of COPD, chronic respiratory failure on 3 L of oxygen via nasal cannula at baseline CAD, pulmonary hypertension, hypertension, and former tobacco use who presents to the emergency department with complaints of rib pain x 1 week. Patient states she is having pain to the bilateral posterior ribs and central upper back. Pain is constant, worse with deep breaths & certain position changes, no alleviating factors. She is experiencing associated dyspnea especially with activity & dry cough. Has baseline leg swelling & skin changes bilaterally, no acute change. Denies fever, chills, chest pain, hemoptysis, recent trauma, recent long travel, hormone use,  or hx of DVT/PE. Has had skin cancer with resections as well as recent surgery- cataract- did not require general anesthesia.    HPI     Past Medical History:  Diagnosis Date  . Abrasion    under left knee x 2 places changing dressing q day of bid, applying ssd cream area healing due to fell 2 weeks ago  . Blood dyscrasia    BLEEDS EASY  . Bruises easily    bleeds easily  . Closed patellar sleeve fracture of left knee 01/04/2018  . Closed patellar sleeve fracture of right knee 01/04/2018   healing   . COPD (chronic obstructive pulmonary disease) ([place])   . Coronary artery disease    per dr Philis Kendall note 01-01-18 note  . Hernia, inguinal    LEFT  . Hypertension    states under control with meds., has been on med. > 10 yr.  . On home oxygen therapy    at night 2L/min  . Pneumonia    "SEVERAL TIMES IN PAST, LAST TIME 2018"  . Pulmonary hypertension ([place])    per dr Terrence Dupont 7-19 19 lov note  . Shortness of breath dyspnea    with exertion  . SVD (spontaneous vaginal delivery)    x 1  . Thin skin   .  Urinary tract infection 2019  . Wears partial dentures    lower partial and upper plate    Patient Active Problem List   Diagnosis Date Noted  . Need for vaccination 10/05/2019  . Rheumatoid factor positive 09/19/2019  . 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 Protein-calorie malnutrition, severe 08/20/2019  . Severe sepsis ([place]) 08/19/2019  . Acute lower UTI 08/19/2019  . Pressure injury of skin 08/19/2019  . ARF (acute renal failure) ([place]) 08/18/2019  . Hyperkalemia 08/18/2019  . Transaminitis 08/18/2019  . Generalized weakness 08/18/2019  . Leukocytosis 08/18/2019  . Anemia 08/18/2019  . Acute renal failure (ARF) ([place]) 08/18/2019  . Chronic respiratory failure with hypoxia and hypercapnia ([place]) 06/02/2018  . COPD with acute exacerbation ([place]) 05/09/2018  . Hyponatremia 05/09/2018  . Acute urinary retention 05/09/2018  . Cystocele with prolapse 03/02/2018  . Cor pulmonale, chronic ([place]) 06/29/2017  . Chronic respiratory failure with hypoxia ([place]) 06/30/2014  . Pulmonary infiltrates 01/26/2013  . Nodule of right lung 08/19/2012  . Cancer screening 05/12/2012  . COPD exacerbation ([place]) 05/12/2012  . Essential hypertension 03/28/2012  . COPD GOLD III 12/02/2011  . Chronic cough 12/02/2011  . Cigarette smoker 12/02/2011    Past Surgical History:  Procedure Laterality Date  . COLONOSCOPY     Less than 10 years ago  per patient   . CYSTOCELE REPAIR N/A 03/02/2018   Procedure: ANTERIOR REPAIR (CYSTOCELE);  Surgeon: Cheri Fowler, MD;  Location: [facility];  Service: Gynecology;  Laterality: N/A;  OUTPATIENT IN BED  . MULTIPLE TOOTH EXTRACTIONS    . RESECTION DISTAL CLAVICAL Left 10/12/2014   Procedure: DISTAL CLAVICLE EXCISION;  Surgeon: Kathryne Hitch, MD;  Location: [facility];  Service: Orthopedics;  Laterality: Left;  . SHOULDER ARTHROSCOPY WITH ROTATOR CUFF REPAIR AND SUBACROMIAL DECOMPRESSION Left 10/12/2014   Procedure: LEFT SHOULDER ARTHROSCOPY, DEBRIDEMENT  WITH ACROMIOPLASTY,  ROTATOR CUFF REPAIR AND RELEASE BICEPS TENDON;  Surgeon: Kathryne Hitch, MD;  Location: [facility];  Service: Orthopedics;  Laterality: Left;     OB History   No obstetric history on file.     Family History  Problem Relation Age of Onset  . Heart disease Father   . Kidney failure Father   . Colon cancer Neg Hx   . Esophageal cancer Neg Hx     Social History   Tobacco Use  . Smoking status: Former Smoker    Packs/day: 1.00    Years: 57.00    Pack years: 57.00    Types: Cigarettes    Quit date: 03/20/2017    Years since quitting: 2.8  . Smokeless tobacco: Never Used  . Tobacco comment: <1ppd 02/05/2016  Vaping Use  . Vaping Use: Never used  Substance Use Topics  . Alcohol use: Yes    Alcohol/week: 28.0 standard drinks    Types: 14 Cans of beer, 14 Shots of liquor per week    Comment: several beers/day and "a shot of whiskey"  . Drug use: No    Home Medications Prior to Admission medications   Medication Sig Start Date End Date Taking? Authorizing Provider  albuterol (PROVENTIL) (2.5 MG/3ML) 0.083% nebulizer solution Take 3 mLs (2.5 mg total) by nebulization every 6 (six) hours as needed for wheezing or shortness of breath. 10/08/15   Tanda Rockers, MD  arformoterol (BROVANA) 15 MCG/2ML NEBU Take 2 mLs (15 mcg total) by nebulization 2 (two) times daily. 10/31/19   Martyn Ehrich, NP  Ascorbic Acid (VITAMIN C WITH ROSE HIPS) 500 MG tablet Take 500 mg by mouth daily.    [provider]  Budeson-Glycopyrrol-Formoterol (BREZTRI AEROSPHERE) 160-9-4.8 MCG/ACT AERO Inhale 2 puffs into the lungs 2 (two) times daily. 11/07/19   Tanda Rockers, MD  budesonide (PULMICORT) 0.5 MG/2ML nebulizer solution Take 2 mLs (0.5 mg total) by nebulization in the morning and at bedtime. DX: J44.9 11/28/19   Tanda Rockers, MD  buPROPion (WELLBUTRIN SR) 150 MG 12 hr tablet Take 150 mg by mouth daily.  05/26/17   [provider]    cholecalciferol (VITAMIN D) 1000 UNITS tablet Take 1,000 Units by mouth daily.    [provider]  Colchicine 0.6 MG CAPS Take 1 tablet by mouth 2 (two) times daily as needed. 10/19/19   Hilts, Legrand Como, MD  diltiazem (CARDIZEM CD) 360 MG 24 hr capsule Take 360 mg by mouth daily.  12/05/17   [provider]  formoterol (PERFOROMIST) 20 MCG/2ML nebulizer solution Take 2 mLs (20 mcg total) by nebulization 2 (two) times daily. DX: J44.9 11/28/19   Tanda Rockers, MD  furosemide (LASIX) 20 MG tablet Take 40 mg by mouth daily as needed for fluid.     [provider]  Garlic 366 MG TABS Take 1 tablet by mouth daily.    [provider]  HYDROcodone-acetaminophen (  NORCO) 7.5-325 MG tablet Take 1 tablet by mouth every 4 (four) hours. 08/03/19   [provider]  hydrOXYzine (ATARAX/VISTARIL) 25 MG tablet Take 25 mg by mouth 3 (three) times daily as needed for anxiety. 08/03/19   [provider]  losartan (COZAAR) 25 MG tablet Take 1 tablet (25 mg total) by mouth daily. 08/24/19   Harold Hedge, MD  Multiple Minerals-Vitamins (CAL MAG ZINC +D3 PO) Take 1 tablet by mouth at bedtime.    [provider]  ondansetron (ZOFRAN) 4 MG tablet Take 2-4 mg by mouth every 6 (six) hours as needed for nausea/vomiting. 07/21/19   [provider]  OXYGEN Inhale 3 L into the lungs. 24/7 DME- [employer]    [provider]  potassium chloride (K-DUR) 10 MEQ tablet Take 10 mEq by mouth daily as needed (swelling). When takes lasix  10/13/17   [provider]  predniSONE (DELTASONE) 10 MG tablet Take  4 each am x 2 days,   2 each am x 2 days,  1 each am x 2 days and stop 11/07/19   Tanda Rockers, MD  Respiratory Therapy Supplies (FLUTTER) DEVI Use as directed 02/05/16   Tanda Rockers, MD  revefenacin (YUPELRI) 175 MCG/3ML nebulizer solution Take 3 mLs (175 mcg total) by nebulization daily. DX: J44.9 11/28/19   Tanda Rockers, MD  spironolactone  (ALDACTONE) 25 MG tablet Take 1 tablet by mouth 2 (two) times daily. 06/03/18   [provider]    Allergies    Tramadol  Review of Systems   Review of Systems  Constitutional: Negative for chills and fever.  Respiratory: Positive for cough and shortness of breath.   Cardiovascular: Positive for leg swelling (baseline unchanged). Negative for chest pain.       Positive for posterior rib pain.   Gastrointestinal: Negative for abdominal pain, diarrhea, nausea and vomiting.  Genitourinary: Negative for dysuria.  Musculoskeletal: Positive for back pain.  Neurological: Negative for dizziness, syncope, weakness, light-headedness and numbness.       Negative for incontinence or saddle anesthesia.  All other systems reviewed and are negative.   Physical Exam Updated Vital Signs BP (!) 161/87 (BP Location: Left Arm)   Pulse 86   Temp 98 F (36.7 C) (Oral)   Resp 18   Ht _0  (1.575 m)   Wt 47.6 kg   SpO2 100%   BMI 19.20 kg/m   Physical Exam Vitals and nursing note reviewed.  Constitutional:      General: She is not in acute distress.    Appearance: She is well-developed. She is not toxic-appearing.  HENT:     Head: Normocephalic and atraumatic.  Eyes:     General:        Right eye: No discharge.        Left eye: No discharge.     Conjunctiva/sclera: Conjunctivae normal.  Cardiovascular:     Rate and Rhythm: Normal rate and regular rhythm.  Pulmonary:     Effort: Pulmonary effort is normal. No respiratory distress.     Breath sounds: Decreased breath sounds (Bibasilar) present. No wheezing, rhonchi or rales.     Comments: Saturating well on her baseline oxygen. Chest:     Chest wall: No tenderness.  Abdominal:     General: There is no distension.     Palpations: Abdomen is soft.     Tenderness: There is no abdominal tenderness.  Musculoskeletal:     Cervical back: Neck supple.  Comments: No midline spinal tenderness. 1+ pitting edema to the bilateral  lower legs.  No calf tenderness.  Skin:    General: Skin is warm and dry.     Comments: Patient has chronic appearing skin changes especially to the bilateral lower legs.  Neurological:     General: No focal deficit present.     Mental Status: She is alert.     Comments: Clear speech.   Psychiatric:        Behavior: Behavior normal.     ED Results / Procedures / Treatments   Labs (all labs ordered are listed, but only abnormal results are displayed) Labs Reviewed  BASIC METABOLIC PANEL - Abnormal; Notable for the following components:      Result Value   Glucose, Bld 116 (*)    BUN 32 (*)    Creatinine, Ser 1.10 (*)    GFR calc non Af Amer 50 (*)    GFR calc Af Amer 58 (*)    All other components within normal limits  CBC - Abnormal; Notable for the following components:   WBC 10.8 (*)    RDW 16.0 (*)    All other components within normal limits  SARS CORONAVIRUS 2 BY RT PCR (HOSPITAL ORDER, [facility])  BRAIN NATRIURETIC PEPTIDE  TROPONIN I (HIGH SENSITIVITY)  TROPONIN I (HIGH SENSITIVITY)    EKG None  Radiology DG Chest 2 View  Result Date: 01/15/2020 CLINICAL DATA:  Shortness of breath and posterior rib pain, history of COPD EXAM: CHEST - 2 VIEW COMPARISON:  Radiograph 05/15/2018, CT 03/13/2015 FINDINGS: Chronic hyperinflation and coarsened interstitial changes towards the bases with relative apical lucency. Increasing nodular opacity in the left lung base. Rounded density projecting over the right apex likely reflects a monitoring lead. Mild central vascular congestion and redistribution with developing peripheral septal lines may suggest superimposed edema. Blunting of the bilateral costophrenic sulci could reflect hyperinflation or trace effusion. No pneumothorax. IMPRESSION: 1. Nodular masslike opacity in the left lung base, consider evaluation with CT 2. Mild central vascular congestion and redistribution with developing peripheral septal  lines may suggest superimposed edema. 3. Chronic hyperinflation and coarsened interstitial changes towards the bases. 4. Rounded opacity in the right apex and may reflect a monitoring lead. Correlate with visual inspection. Electronically Signed   By: Lovena Le M.D.   On: 01/15/2020 20:03   CT Angio Chest PE W/Cm &/Or Wo Cm  Result Date: 01/16/2020 CLINICAL DATA:  Abnormal chest x-ray. High probability for pulmonary embolism. Chest pain and shortness of breath EXAM: CT ANGIOGRAPHY CHEST WITH CONTRAST TECHNIQUE: Multidetector CT imaging of the chest was performed using the standard protocol during bolus administration of intravenous contrast. Multiplanar CT image reconstructions and MIPs were obtained to evaluate the vascular anatomy. CONTRAST:  164m OMNIPAQUE IOHEXOL 350 MG/ML SOLN COMPARISON:  03/13/2015 FINDINGS: Cardiovascular: Satisfactory opacification of the pulmonary arteries to the segmental level. No evidence of pulmonary embolism. Normal heart size. No pericardial effusion. Diffuse atherosclerotic calcification of the aorta and great vessels. Aberrant right subclavian artery. Mediastinum/Nodes: Negative for adenopathy or mass. No pneumomediastinum. Lungs/Pleura: Severe panlobular emphysema. 18 x 12 mm nodule in the right upper lobe. There is a smaller more peripheral pulmonary nodule in the right upper lobe on 6:33 measuring 8 mm. There is no edema, consolidation, effusion, or pneumothorax. Upper Abdomen: No acute finding Musculoskeletal: No acute finding. Remote left-sided rib fractures. 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 Diffuse degenerative disc narrowing mild endplate ridging. Chronic appearing  T8 inferior endplate depression. Review of the MIP images confirms the above findings. IMPRESSION: 1. Negative for pulmonary embolism or other acute finding. 2. Two right upper lobe pulmonary nodules (18 x 12 mm and 8 mm) which are hopefully related to right upper lobe pneumonia seen in 2016, but neoplasm is not  excluded and follow-up is recommended. Consider one of the following in 3 months: (a) repeat chest CT, (b) follow-up PET-CT, or (c) tissue sampling. This recommendation follows the consensus statement: Guidelines for Management of Incidental Pulmonary Nodules Detected on CT Images: From the Fleischner Society 2017; Radiology 2017; 284:228-243. 3. Aortic Atherosclerosis (ICD10-I70.0) and Emphysema (ICD10-J43.9). Electronically Signed   By: Monte Fantasia M.D.   On: 01/16/2020 04:28    Procedures Procedures (including critical care time)  Medications Ordered in ED Medications  sodium chloride (PF) 0.9 % injection (has no administration in time range)  fentaNYL (SUBLIMAZE) injection 50 mcg (50 mcg Intravenous Given 01/16/20 0522)  iohexol (OMNIPAQUE) 350 MG/ML injection 100 mL (100 mLs Intravenous Contrast Given 01/16/20 0353)  azithromycin (ZITHROMAX) tablet 500 mg (500 mg Oral Given 01/16/20 0522)  amoxicillin-clavulanate (AUGMENTIN) 875-125 MG per tablet 1 tablet (1 tablet Oral Given 01/16/20 0522)    ED Course  I have reviewed the triage vital signs and the nursing notes.  Pertinent labs & imaging results that were available during my care of the patient were reviewed by me and considered in my medical decision making (see chart for details).  MARGAREE SANDHU was evaluated in Emergency Department on 01/16/2020 for the symptoms described in the history of present illness. He/she was evaluated in the context of the global COVID-19 pandemic, which necessitated consideration that the patient might be at risk for infection with the SARS-CoV-2 virus that causes COVID-19. Institutional protocols and algorithms that pertain to the evaluation of patients at risk for COVID-19 are in a state of rapid change based on information released by regulatory bodies including the CDC and federal and state organizations. These policies and algorithms were followed during the patient's care in the ED.    MDM  Rules/Calculators/A&P                          Patient presents to the ED with complaints of pleuritic back pain and dyspnea over the past 1 week.  Patient is nontoxic, respirations appear unlabored on her baseline oxygen requirement, vitals without significant abnormality.  Decreased breath sounds noted at the bases.  Chronic appearing skin changes noted throughout especially in the lower legs. Additional history obtained:  Additional history obtained from chart and nursing note reviewed.. EKG: No STEMI Lab Tests:  I reviewed & interpreted labs including:  CBC: Mild leukocytosis @ 10.8. No critical anemia.  BMP: Elevated BUN & creatinine compared to prior.  Troponin: 11  Imaging Studies ordered:  Triage ordered CXR per protocol, I independently visualized and interpreted imaging which showed  1. Nodular masslike opacity in the left lung base, consider evaluation with CT 2. Mild central vascular congestion and redistribution with developing peripheral septal lines may suggest superimposed edema. 3. Chronic hyperinflation and coarsened interstitial changes towards the bases. 4. Rounded opacity in the right apex and may reflect a monitoring lead. Correlate with visual inspection  Will further evaluate with CTA, delta troponin, and BNP. Fentanyl ordered for pain.  CTA: Negative for pulmonary embolism.  Two right upper lobe pulmonary nodules which may be related to an upper lobe pneumonia but neoplasm is not  excluded.  Patient with minimal leukocytosis, she is afebrile, she is saturating well on her baseline oxygen requirement, she does not appear to require admission for pneumonia treatment at this time.  We will start her on azithromycin and Augmentin with very close primary care follow-up (instructed not to take her as needed colchicine when taking abx), discussed need for repeat imaging. Troponins flat, doubt ACS. BNP WNL feel CHF is less likely. I discussed results, treatment plan, need for  follow-up, and return precautions with the patient. Provided opportunity for questions, patient confirmed understanding and is in agreement with plan.   This is a shared visit with supervising physician Dr. Darl Householder who has independently evaluated patient & provided guidance in evaluation/management/disposition, in agreement with care   Portions of this note were generated with Dragon dictation software. Dictation errors may occur despite best attempts at proofreading.  Final Clinical Impression(s) / ED Diagnoses Final diagnoses:  Dyspnea, unspecified type  Lung nodules    Rx / DC Orders ED Discharge Orders         Ordered    lidocaine (LIDODERM) 5 %  Daily PRN     Discontinue  Reprint     01/16/20 0525    amoxicillin-clavulanate (AUGMENTIN) 875-125 MG tablet  Every 12 hours     Discontinue  Reprint     01/16/20 0525    azithromycin (ZITHROMAX) 250 MG tablet  Daily     Discontinue  Reprint     01/16/20 0525           Amaryllis Dyke, PA-C 01/16/20 0526    Amaryllis Dyke, PA-C 01/16/20 0530    Drenda Freeze, MD 01/16/20 [phone]

## 2020-01-17 ENCOUNTER — Telehealth: Payer: Self-pay | Admitting: Internal Medicine

## 2020-01-17 NOTE — Telephone Encounter (Signed)
Spoke with pt, states she was seen in ED yesterday for chest pain, was told she either has PNA or nodules, and needed to follow up with MW.  Per ED notes a follow up CXR in 1 month was recommended.  Pt is already scheduled to see MW on 8/30, and he has no openings prior to this.  Offered to schedule pt with one of our APPS but pt declined, stating she would call back if she felt like she needed to be seen prior to 8/30 appt.  Nothing further needed at this time- will close encounter.

## 2020-01-29 ENCOUNTER — Other Ambulatory Visit: Payer: Self-pay

## 2020-01-29 ENCOUNTER — Ambulatory Visit
Admission: RE | Admit: 2020-01-29 | Discharge: 2020-01-29 | Disposition: A | Discharge status: Home / Self Care | Payer: Medicare Other | Source: Ambulatory Visit | Attending: Orthopedic Surgery | Admitting: Orthopedic Surgery

## 2020-01-29 DIAGNOSIS — M24541 Contracture, right hand: Secondary | ICD-10-CM

## 2020-02-06 ENCOUNTER — Telehealth: Payer: Self-pay | Admitting: Physician Assistant

## 2020-02-06 ENCOUNTER — Telehealth: Payer: Self-pay | Admitting: Internal Medicine

## 2020-02-06 ENCOUNTER — Encounter: Payer: Self-pay | Admitting: Physician Assistant

## 2020-02-06 ENCOUNTER — Other Ambulatory Visit: Payer: Self-pay

## 2020-02-06 ENCOUNTER — Ambulatory Visit (INDEPENDENT_AMBULATORY_CARE_PROVIDER_SITE_OTHER): Payer: Medicare Other | Admitting: Physician Assistant

## 2020-02-06 DIAGNOSIS — D045 Carcinoma in situ of skin of trunk: Secondary | ICD-10-CM | POA: Diagnosis not present

## 2020-02-06 DIAGNOSIS — C44521 Squamous cell carcinoma of skin of breast: Secondary | ICD-10-CM

## 2020-02-06 DIAGNOSIS — J449 Chronic obstructive pulmonary disease, unspecified: Secondary | ICD-10-CM

## 2020-02-06 DIAGNOSIS — I890 Lymphedema, not elsewhere classified: Secondary | ICD-10-CM | POA: Diagnosis not present

## 2020-02-06 DIAGNOSIS — B359 Dermatophytosis, unspecified: Secondary | ICD-10-CM | POA: Diagnosis not present

## 2020-02-06 DIAGNOSIS — D485 Neoplasm of uncertain behavior of skin: Secondary | ICD-10-CM

## 2020-02-06 LAB — POCT SKIN KOH

## 2020-02-06 NOTE — Telephone Encounter (Signed)
Called to tell [name] that the med she used was Triamcinolone

## 2020-02-06 NOTE — Telephone Encounter (Signed)
That's fine

## 2020-02-06 NOTE — Progress Notes (Signed)
   New Patient Visit  Subjective  [name] is a 74 y.o. female who presents for the following: Skin Problem (legs x years- scaly and flaky- tx silvadene cream). Her legs have been like this for years. She has had much more swelling than this at times  Objective  Well appearing patient in no apparent distress; mood and affect are within normal limits.  A focused examination was performed including back, arms, chest, legs. Relevant physical exam findings are noted in the Assessment and Plan.  Objective  Left Breast, inferior: Crusted papule     Objective  Left Breast, superior: Large thick horn     Objective  Left Lower Leg - Anterior, Right Lower Leg - Anterior: Sweling of both lower legs with erythematous scaling lower legs. Small ulceration right shin.   Images      Assessment & Plan  Tinea  Other Related Procedures POCT Skin KOH  Neoplasm of uncertain behavior of skin (2) Left Breast, inferior  Skin / nail biopsy Type of biopsy: tangential   Informed consent: discussed and consent obtained   Timeout: patient name, date of birth, surgical site, and procedure verified   Procedure prep:  Patient was prepped and draped in usual sterile fashion (Non sterile) Prep type:  Chlorhexidine Anesthesia: the lesion was anesthetized in a standard fashion   Anesthetic:  1% lidocaine w/ epinephrine 1-100,000 local infiltration Instrument used: flexible razor blade   Outcome: patient tolerated procedure well   Post-procedure details: wound care instructions given    Specimen 1 - Surgical pathology Differential Diagnosis: bcc vs scc Check Margins: No  Left Breast, superior  Skin / nail biopsy Type of biopsy: tangential   Informed consent: discussed and consent obtained   Timeout: patient name, date of birth, surgical site, and procedure verified   Procedure prep:  Patient was prepped and draped in usual sterile fashion (Non sterile) Prep type:   Chlorhexidine Anesthesia: the lesion was anesthetized in a standard fashion   Anesthetic:  1% lidocaine w/ epinephrine 1-100,000 local infiltration Instrument used: flexible razor blade   Outcome: patient tolerated procedure well   Post-procedure details: wound care instructions given    Specimen 2 - Surgical pathology Differential Diagnosis: bcc vs scc Check Margins: No  Lymphedema of both lower extremities (2) Left Lower Leg - Anterior; Right Lower Leg - Anterior   We discussed the cause of lymphedema and that it is not a skin problem. We also discussed the chance of overlapping venous stasis issues. We will look to find her a lymphedema clinic to see if she can be treated there for her legs.

## 2020-02-06 NOTE — Telephone Encounter (Signed)
Called and spoke with pt letting her know that MW said it was fine for her to have a cxr prior to appt and she verbalized understanding. Order has been placed and appt notes has been updated. Nothing further needed.

## 2020-02-06 NOTE — Telephone Encounter (Signed)
Called and spoke to Patient, who stated that she dx with PNA 01/16/2020 and was instructed to have f/u CXR in 1 week. Patient has an upcoming appointment for 02/13/2020 and is questioning if she should have CXR prior to visit.  Dr. Melvyn Novas, please advise. Thanks

## 2020-02-06 NOTE — Patient Instructions (Signed)

## 2020-02-07 ENCOUNTER — Ambulatory Visit: Payer: Medicare Other | Admitting: Internal Medicine

## 2020-02-08 ENCOUNTER — Encounter: Payer: Self-pay | Admitting: Physician Assistant

## 2020-02-09 ENCOUNTER — Telehealth: Payer: Self-pay | Admitting: Internal Medicine

## 2020-02-09 NOTE — Telephone Encounter (Signed)
Copy of note faxed to Dr Kathryne Eriksson, [phone].  Nothing further at this time.

## 2020-02-09 NOTE — Telephone Encounter (Signed)
All that is needed is copy of note to Dr Kathryne Eriksson

## 2020-02-09 NOTE — Telephone Encounter (Signed)
Primary Pulmonologist: Dr. Melvyn Novas Last office visit and with whom: 11/07/2019 Dr. Melvyn Novas What do we see them for (pulmonary problems): COPD Last OV assessment/plan: See below  Was appointment offered to patient (explain)?  No   Reason for call: Called and spoke with patient she states that she has severe rib pain that started yesterday and has got worse over the day. Pt states she was seen at [facility] ER 7/31 and 8/1 and had pneumonia. Patient wanting an antibiotic before it gets worse. Denies cough, picking up something to heavy, etc to cause pain. Patient is already scheduled for an appointment on Monday 8/30 with Dr. Melvyn Novas.  Dr. Melvyn Novas please advise    (examples of things to ask: : When did symptoms start? Fever? Cough? Productive? Color to sputum? More sputum than usual? Wheezing? Have you needed increased oxygen? Are you taking your respiratory medications? What over the counter measures have you tried?)  Allergies  Allergen Reactions  . Tramadol Other (See Comments)    GI UPSET    Immunization History  Administered Date(s) Administered  . Influenza Split 03/17/2011, 03/30/2013, 04/03/2014, 02/14/2015  . Influenza Whole 03/17/2012, 03/09/2016  . Influenza, High Dose Seasonal PF 03/10/2017, 03/15/2018, 04/18/2019  . Pneumococcal Conjugate-13 01/16/2014, 04/18/2019  . Pneumococcal Polysaccharide-23 06/16/2008  . Tdap 06/16/2005, 07/15/2016

## 2020-02-09 NOTE — Telephone Encounter (Signed)
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 Osteo related   Copy to Dr Kathryne Eriksson

## 2020-02-13 ENCOUNTER — Ambulatory Visit (INDEPENDENT_AMBULATORY_CARE_PROVIDER_SITE_OTHER): Payer: Medicare Other | Admitting: Internal Medicine

## 2020-02-13 ENCOUNTER — Other Ambulatory Visit: Payer: Self-pay

## 2020-02-13 ENCOUNTER — Encounter: Payer: Self-pay | Admitting: Internal Medicine

## 2020-02-13 DIAGNOSIS — J9612 Chronic respiratory failure with hypercapnia: Secondary | ICD-10-CM

## 2020-02-13 DIAGNOSIS — J449 Chronic obstructive pulmonary disease, unspecified: Secondary | ICD-10-CM

## 2020-02-13 DIAGNOSIS — R0789 Other chest pain: Secondary | ICD-10-CM | POA: Diagnosis not present

## 2020-02-13 DIAGNOSIS — R918 Other nonspecific abnormal finding of lung field: Secondary | ICD-10-CM

## 2020-02-13 DIAGNOSIS — I2781 Cor pulmonale (chronic): Secondary | ICD-10-CM | POA: Diagnosis not present

## 2020-02-13 DIAGNOSIS — J9611 Chronic respiratory failure with hypoxia: Secondary | ICD-10-CM

## 2020-02-13 NOTE — Telephone Encounter (Signed)
-----  Message from Lavonna Monarch, MD sent at 02/11/2020  9:01 AM EDT ----- JCB obtained biopsies and should provide input on treatment.  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

## 2020-02-13 NOTE — Patient Instructions (Addendum)
Let Dr Redmond Pulling know about the pain and how the medication helps and what the work up plan  No change in pulmonary recommendations  Please schedule a follow up visit in 3 months but call sooner if needed

## 2020-02-13 NOTE — Progress Notes (Signed)
Subjective:   Patient ID: [name], female    DOB: 14-Jun-1946   MRN: [id number]  Brief patient profile:  74 yowf MM variably reports smoking cessation   followed in pulmonary clinic for GOLD III copd   History of Present Illness  11/05/2015  f/u ov/[name] re: GOLD III / still smoking / maint rx symb/spriiva dpi  Chief Complaint  Patient presents with  . Follow-up    pt states breathing is baseline. c/o cont SOB, prod cough tan to greenish in color, wheezing, cp/tightness  dyspnea is variable and has portable tanks but never uses them to see if makes any difference in her variable doe / using saba up to 4 x daily instead.   rec You are qualified for portable 02 if you wish but no need to wear it at rest or walking around the house - continue it at bedtime thru the concentrator at 2lpm and wear the portable system as needed during the day Ok to finish up the powder spiriva and just refill the respimat    12/24/2016  f/u ov/Heriberto Stmartin re:  COPD GOLD III/ still smoking/ symb/spiriva / 02 2lpm hs only  Chief Complaint  Patient presents with  . Follow-up    Pt states she is still having increase sob with exerton, she still has a slight cough with clear mucus with a tint of blood, lots of wheezing,Denies chest tightness,fever   using saba hfa up to twice daily but no needing noct Doe = MMRC3 = can't walk 100 yards even at a slow pace at a flat grade s stopping due to sob   Has slt bloody nasal discharge in am and scant blood streaked sputum also, just in am and just when nose is bleeding too    11/11/2017  f/u ov/Pauline Pegues re:  GOLD III/ 02 hs prn  Chief Complaint  Patient presents with  . Follow-up    Breathing is about the same. She is using her albuterol inhaler 2-3 x per wk on average and she rarely uses neb.   Dyspnea:   No change in doe = MMRC3  Cough: not much at all  Sleep: flat on 2lpm  SABA use:  As above Sleeping  Ok on 2lpm flat   rec Discuss the fluid pills with  Dr Redmond Pulling but  I will recommend he consider adding aldactone if not improving on lasix alone but have to be very careful with potassium monitoring with these changes so I will defer to him  Please schedule a follow up visit in 6 months but call sooner if needed - referred to rehab     05/03/18 acute ov/ NP aecopd rec Seen  for COPD exacerbation secondary to URI Office treatment: Xopenex neb treatment x1 today Rx: Prednisone taper as prescribed Z-Pak as prescribed Recommendations: Used Albuterol nebulizer every 6 hours as needed for shortness of breath/wheezing for the next 3 to 5 days until better You can return to work as long as you are afebrile for 24 hours Stay well-hydrated, encourage 4-6 glass of fluids (8 oz)    Admit date: 05/08/2018 Discharge date: 05/20/2018   Discharge Diagnoses:      Active Hospital Problems   Diagnosis Date Noted  . COPD with acute exacerbation ([place]) 05/09/2018  . Hyponatremia 05/09/2018  . Acute urinary retention 05/09/2018  . Chronic respiratory failure with hypoxia ([place]) 06/30/2014  . Essential hypertension 03/28/2012    History of present illness:  [name] a 74 y.o.femalewith medical history  significant forCOPD with chronic hypoxic respiratory failure, hypertension, and chronic bilateral leg swelling and ulcerations, now presenting to the emergency department for evaluation of shortness of breath and productive cough. Patient was seen in her pulmonology clinic on 05/03/2018 with productive cough and increased dyspnea, was diagnosed with acute exacerbation in COPD and started on prednisone and azithromycin. She failed to improve, was seen back on 05/06/2018 and reports that her prednisone course was extended. Unfortunately, she still has not had any significant improvement and may actually be worsening. She has been dyspneic at rest for the past couple days and has had difficulty sleeping due to this. She denies fevers, chills, or chest  pain. Reports that her chronic bilateral lower extremity swelling is not as bad as usual. Pt admitted for further management   Today, pt reported feeling better, was able to ambulate with PT, with noted desaturations requiring home O2. Pt advised to use home O2 constantly (at rest and ambulation), not just at bedtime. Pt discharged on a long taper of steroids and advised to follow up closely with PCP and pulmonary. Stable for d/c with [facility] Hospital Course:  Principal Problem:   COPD with acute exacerbation ([place]) Active Problems:   Essential hypertension   Chronic respiratory failure with hypoxia ([facility])   Hyponatremia   Acute urinary retention  Acute on chronic hypoxic respiratory failure with COPD Exacerbation -Presented with worsening shortness of breath, cough despite being treated with prednisone and azithromycin since 05/03/2018 - long hospital stay with slow improvement, admitted on 11/23 and required BIPAP at admission, and has completed course of abx with levaquin on 11/27. She's been transitioned from IV solumedrol to PO prednisone -Continue duoneb, inhaler, tapered dose of prednisone -Completed course of Levaquin  -Continues to require supplemental O2 (3 L now, was previously on 2 L at night only) -CXR 11/30, showing improvement -Follow up with PCP and Dr Melvyn Novas (pulmonary)  Hyponatremia -resolved  Essential hypertension -Continue diltiazem, losartan  Alcohol use -Continue folic acid, thiamine, multivitamin   Leukocytosis  -likely due to steroids  Hyperglycemia -no history of diabetes  -hemoglobin A1c 6.1  Deconditioning -secondary to respiratory status/illness -PT evaluated patient, no further needs -OT recommended [facility]     06/01/2018  Post hosp f/u ov/[name] re: transition of care:   Aecopd/ 02 dep 24/7  Chief Complaint  Patient presents with  . Hospitalization Follow-up    Breathing has improved some but not back to her normal baseline. She  is using her o2 3lpm 24/7.  She has not used her albuterol inhaler but has been using her albuterol neb once daily on average.   Dyspnea:  MMRC4  = sob if tries to leave home or while getting dressed   Cough: some rattling  Sleeping: horizontal on back ok SABA use: avg neb once  02: 3lpm 24/7 rec 3lpm at bedtime, none at rest and turn up your 02 to keep it above 90% with activity  Plan A = Automatic = symbicort and spiriva  Plan B = Backup Only use your albuterol inhaler as a rescue medication   Plan C = Crisis - only use your albuterol nebulizer if you first try Plan B and it fails to help > ok to use the nebulizer up to every 4 hours but if start needing it regularly call for immediate appointment For cough/ congestion >  mucinex or mucinex dm up to 1200 mg every 12 hours with flutter valve  Work on inhaler technique:  If not improving >  Prednisone 10 mg take  4 each am x 2 days,   2 each am x 2 days,  1 each am x 2 days and stop         08/11/2018  Acute ov/Ashden Sonnenberg re:  Copd GOLD III/ cor pulmonale:  symb 160 /spiriva / 02 hs and prn day Has pred as plan D but confused when and how to use it  Chief Complaint  Patient presents with  . Acute Visit    runny nose- min green nasal d/c and increased cough- started last night. She is feeling more SOB today.   Dyspnea:  No increase Cough: more congested in am and turning slt more yellow on day of ov  Sleeping: bed is flat/ one pillow SABA use: not using at all despite  02: 3lpm hs and as needed during the day but not titrating as rec  rec  For increased / wheezing/ cough or need for albuterol > prednisone 10 mg x 2 until better then 1 daily x 3 days and stop Monitor your 02 level with exertion with goal of keeping over 90%     01/12/2019  f/u ov/Hetty Linhart re: GOLD III/ cor pulmonale maint on symb/spiriva  Chief Complaint  Patient presents with  . Follow-up    F/U YI:FOYD. She reports her breathing has not been well with the hot weather.  Currenlty taking Keflex for left leg cellulitis and knee infection.   Dyspnea:  Not going out much / room to room ok Cough: still has "smoker's rattle" cough worse in am >  thick white 1-2 tsp Sleeping: bed is flat, one pillow SABA use: sev times a day when overdoes it  02: 3lpm hs and prn daytime  rec For increased / wheezing/ cough or need for albuterol > prednisone 10 mg x 2 until better then 1 daily x 3 days and stop Monitor your 02 level with exertion with goal of keeping over 90%    Ad[facility] to [facility] > snf p out around 10/21/19   11/07/2019  f/u ov/Jovanie Verge re: last smoked one day prior/ thoroughly confused with details of care   Chief Complaint  Patient presents with  . Follow-up    pt is having sob using rescue inhaler 2 to 3 times aweek  Dyspnea:  Across the room gives out on 2-3 lpm but does not check sats walking  Cough: no Sleeping: still on couch with one pillow  SABA use: as above  02: 2-3lpm  rec Plan A = Automatic = Always=    Breztri Take 2 puffs first thing in am and then another 2 puffs about 12 hours later (do not use symbicort / spiriva)  Work on inhaler technique:  relax and gently blow all the way out then take a nice smooth deep breath back in, triggering the inhaler at same time you start breathing in.  Hold for up to 5 seconds if you can. Blow out thru nose. Rinse and gargle with water when done Plan B = Backup (to supplement plan A, not to replace it) Only use your albuterol inhaler as a rescue medication to be used if you can't catch your breath by resting or doing a relaxed purse lip breathing pattern.  - The less you use it, the better it will work when you need it. - Ok to use the inhaler up to 2 puffs  every 4 hours if you must but call for appointment if use goes up over  your usual need - Don't leave home without it !!  (think of it like the spare tire for your car)  Plan C = Crisis (instead of Plan B but only if Plan B stops working) - only use your albuterol  nebulizer if you first try Plan B and it fails to help > ok to use the nebulizer up to every 4 hours but if start needing it regularly call for immediate appointment Plan D = Deltasone - Prednisone 10 mg take  4 each am x 2 days,   2 each am x 2 days,  1 each am x 2 days and stop  I will see if we can resubmit your formoterol(brovana or performist)  and budesonide and yupelri thru [employer] (to take the  place of breztri (three)   = symbicort/spiriva (two plus one)    02/13/2020  f/u ov/Dolora Ridgely re:  Last week in July 2021  Bilateral R >L mid post cp radiates like a cirlce > ER > lidoderm > Dr Redmond Pulling called hydrocodone which helps the most  Chief Complaint  Patient presents with  . Follow-up    SOB   [facility] [place] eval for positoianl back pain >  Referred to [facility] rec MRI  Dyspnea:  No change sob  Cough: white mucus  Sleeping: can get comfortable  SABA use:  02: 2-3 lpm np    No obvious day to day or daytime variability or assoc excess/ purulent sputum or mucus plugs or hemoptysis or cp or chest tightness, subjective wheeze or overt sinus or hb symptoms.   96 without nocturnal  or early am exacerbation  of respiratory  c/o's or need for noct saba. Also denies any obvious fluctuation of symptoms with weather or environmental changes or other aggravating or alleviating factors except as outlined above   No unusual exposure hx or h/o childhood pna/ asthma or knowledge of premature birth.  Current Allergies, Complete Past Medical History, Past Surgical History, Family History, and Social History were reviewed in [employer] record.  ROS  The following are not active complaints unless bolded Hoarseness, sore throat, dysphagia, dental problems, itching, sneezing,  nasal congestion or discharge of excess mucus or purulent secretions, ear ache,   fever, chills, sweats, unintended wt loss or wt gain, classically pleuritic or exertional cp,  orthopnea pnd or  arm/hand swelling  or leg swelling, presyncope, palpitations, abdominal pain, anorexia, nausea, vomiting, diarrhea  or change in bowel habits or change in bladder habits, change in stools or change in urine, dysuria, hematuria,  rash, arthralgias, visual complaints, headache, numbness, weakness or ataxia or problems with walking/ now wc bound  or coordination,  change in mood or  memory.        Current Meds  Medication Sig  . albuterol (PROVENTIL) (2.5 MG/3ML) 0.083% nebulizer solution Take 3 mLs (2.5 mg total) by nebulization every 6 (six) hours as needed for wheezing or shortness of breath.  Marland Kitchen arformoterol (BROVANA) 15 MCG/2ML NEBU Take 2 mLs (15 mcg total) by nebulization 2 (two) times daily.  . Ascorbic Acid (VITAMIN C WITH ROSE HIPS) 500 MG tablet Take 500 mg by mouth daily.  Marland Kitchen azithromycin (ZITHROMAX) 250 MG tablet Take 1 tablet (250 mg total) by mouth daily. Take first 2 tablets together, then 1 every day until finished.  . Budeson-Glycopyrrol-Formoterol (BREZTRI AEROSPHERE) 160-9-4.8 MCG/ACT AERO Inhale 2 puffs into the lungs 2 (two) times daily.  . budesonide (PULMICORT) 0.5 MG/2ML nebulizer solution Take 2  mLs (0.5 mg total) by nebulization in the morning and at bedtime. DX: J44.9  . buPROPion (WELLBUTRIN SR) 150 MG 12 hr tablet Take 150 mg by mouth daily.   . cholecalciferol (VITAMIN D) 1000 UNITS tablet Take 1,000 Units by mouth daily.  . Colchicine 0.6 MG CAPS Take 1 tablet by mouth 2 (two) times daily as needed.  . diltiazem (CARDIZEM CD) 360 MG 24 hr capsule Take 360 mg by mouth daily.   . formoterol (PERFOROMIST) 20 MCG/2ML nebulizer solution Take 2 mLs (20 mcg total) by nebulization 2 (two) times daily. DX: J44.9  . furosemide (LASIX) 20 MG tablet Take 40 mg by mouth daily as needed for fluid.   . Garlic 832 MG TABS Take 1 tablet by mouth daily.  Marland Kitchen HYDROcodone-acetaminophen (NORCO) 7.5-325 MG tablet Take 1 tablet by mouth every 4 (four) hours.  . hydrOXYzine (ATARAX/VISTARIL) 25  MG tablet Take 25 mg by mouth 3 (three) times daily as needed for anxiety.  . lidocaine (LIDODERM) 5 % Place 1 patch onto the skin daily as needed. Apply patch to area most significant pain once per day.  Remove and discard patch within 12 hours of application.  Marland Kitchen losartan (COZAAR) 25 MG tablet Take 1 tablet (25 mg total) by mouth daily.  . Multiple Minerals-Vitamins (CAL MAG ZINC +D3 PO) Take 1 tablet by mouth at bedtime.  . ondansetron (ZOFRAN) 4 MG tablet Take 2-4 mg by mouth every 6 (six) hours as needed for nausea/vomiting.  . OXYGEN Inhale 3 L into the lungs. 24/7 DME- [employer]  . potassium chloride (K-DUR) 10 MEQ tablet Take 10 mEq by mouth daily as needed (swelling). When takes lasix   . predniSONE (DELTASONE) 10 MG tablet Take  4 each am x 2 days,   2 each am x 2 days,  1 each am x 2 days and stop  . Respiratory Therapy Supplies (FLUTTER) DEVI Use as directed  . revefenacin (YUPELRI) 175 MCG/3ML nebulizer solution Take 3 mLs (175 mcg total) by nebulization daily. DX: J44.9  . spironolactone (ALDACTONE) 25 MG tablet Take 1 tablet by mouth 2 (two) times daily.                  Objective:  Physical Exam  W/c  Bound elderly wf chronically ill with paroxysms of back pain when she changes position.     02/13/2020     110  11/07/2019    100 01/12/2019     110  09/26/2013  101>102 10/11/2013 > 01/16/2014  99 > 04/06/2014  103 >105 06/30/2014 > 08/11/2014  101 > 11/09/2014  104 > 01/22/2015 106  > 02/06/2015 105 > 03/08/2015 106 > 04/18/2015  106 > 06/15/2015 103 > 09/14/2015   102 > 10/08/2015   103 > 11/05/2015  102 > 02/05/2016  103 > 05/14/2016 102 > 09/24/2016  99 > 12/24/2016  99 > 06/29/2017  104 > 09/28/2017  106 > 11/11/2017 104 > 02/03/2018  107 > 05/06/2018   105 > 06/01/2018  111> 07/14/2018  110 > 08/11/2018   109    Vital signs reviewed  02/13/2020  - Note at rest 02 sats  96% on 3lpm POC  HEENT : pt wearing mask not removed for exam due to covid -19 concerns.    NECK :  without JVD/Nodes/TM/  nl carotid upstrokes bilaterally   LUNGS: no acc muscle use,  Mod barrel  contour chest wall with bilateral  Distant bs s audible wheeze and  without cough  on insp or exp maneuvers and mod  Hyperresonant  to  percussion bilaterally     CV:  RRR  no s3 or murmur or increase in P2, and no edema   ABD:  soft and nontender with pos mid insp Hoover's  in the supine position. No bruits or organomegaly appreciated, bowel sounds nl  MS:     ext warm without deformities, calf tenderness, cyanosis or clubbing No obvious joint restrictions   SKIN: warm and dry without lesions    NEURO:  alert, approp, nl sensorium with  no motor or cerebellar deficits apparent.                I personally reviewed images and agree with radiology impression as follows:   Chest CTa 01/16/20  No PE Musculoskeletal: No acute finding. Remote left-sided rib fractures. 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 Diffuse degenerative disc narrowing mild endplate ridging. Chronic appearing T8 inferior endplate depression Two right upper lobe pulmonary nodules (18 x 12 mm and 8 mm) which are hopefully related to right upper lobe pneumonia seen in 2016, but neoplasm is not excluded                            Assessment & Plan:

## 2020-02-14 ENCOUNTER — Encounter: Payer: Self-pay | Admitting: Internal Medicine

## 2020-02-14 DIAGNOSIS — R0789 Other chest pain: Secondary | ICD-10-CM | POA: Insufficient documentation

## 2020-02-14 NOTE — Assessment & Plan Note (Signed)
Exertional desats 06/30/2014 >O2 2lm rx with act  - 11/09/2014  Walked RA  2 laps @ 185 ft each stopped due to  Sat down to 88 % at nl pace but no sob  - ono RA  09/22/15  desat < 89% x 1:67mn > rec continue 02 2lpm - 41/96/2229certification: Patient Saturations on Room Air at Rest = 94% and while Ambulating 2 laps x 185 each= 87% Patient Saturations on 2Liters of oxygen while Ambulating =96% x one additional lap - 11/05/2015  Walked RA x 4 laps @ 185 ft each stopped due to  desat to 87% resolved on 2lpm   - 06/29/2017  Walked RA x 3 laps @ 185 ft each stopped due to  End of study/ slow pace, no desat - 11/11/2017  Walked RA x 3 laps @ 185 ft each stopped due to  End of study,  no  desat  < 90% at moderate to moderately  fast pace   - HCO3  05/20/18  = 34    - 07/14/2018 Room Air at Rest = 96% Patient Saturations on Room Air while Ambulating = 88% Patient Saturations on 3 Liters of oxygen while Ambulating = 95% - ono on 2lpm completed 07/20/18  desat x 132 min so 07/22/2018 rec  repeat ono on 3lpm> done 07/30/18 on 3lpm = 10.6 m at < 89% so ok to continue 3lpm hs  -    As of 02/13/2020  3lpm hs  And 3lpm walking more than around the house with goal of > 90% at all times due to cor pulmonale

## 2020-02-14 NOTE — Assessment & Plan Note (Signed)
CT chest 01/16/20  Diffuse degenerative disc narrowing mild endplate ridging. Chronic appearing T8 inferior endplate depression  Advised that the patter of pain that is exquisitely positional and radiates bilaterally is classic for T spine mscp and not related to the lung nodules or her lungs at all > f/u by ortho planned > all analgesics per PCP/ ortho.

## 2020-02-14 NOTE — Assessment & Plan Note (Signed)
rx is adequate 02 24/7 for WHO III  PH

## 2020-02-14 NOTE — Assessment & Plan Note (Addendum)
Active smoker  pfts 11/2011   FEV1 0.86 (42%) ratio 35  - alpha one AT screening 03/24/12   MM  Level 166  - 09/14/2015  hfa Arlana Pouch 11/05/2015 (when spiriva dpi supply runs out) - flutter valve added 02/05/2016  And recoached 05/14/2016  - PFT's  06/29/2017  FEV1 0.92 (44 % ) ratio 44  p 11 % improvement from saba p symb/spiriva prior to study with DLCO  33/33 % corrects to 35 % for alv volume   - 11/11/2017 referred to rehab >   did not go 05/03/2018 - COPD exac d/t URI, RX zpack and prednisone taper > failed outpt rx and admitted  - 08/11/2018 added pred as plan D  - 11/07/2019  After extensive coaching inhaler device,  effectiveness =    75% from baseline 25%> ok to try breztri 2 bid sample    Group D in terms of symptom/risk and laba/lama/ICS  therefore appropriate rx at this point >>>  Continue neb laba/lama/ics

## 2020-02-14 NOTE — Assessment & Plan Note (Addendum)
See CTa 01/16/20   Given the endstage nature of her copd, the microscopic nodules will not be aggressively pursued at this time  Discussed in detail all the  indications, usual  risks and alternatives  relative to the benefits with patient who agrees to proceed with conservative f/u as outlined    Medical decision making was a moderate level of complexity in this case because of one acuten and  > two chronic conditions /diagnoses requiring extra time for  H and P, chart review, counseling,   and generating customized AVS unique to this office visit and charting.   Each maintenance medication was reviewed in detail including emphasizing most importantly the difference between maintenance and prns and under what circumstances the prns are to be triggered using an action plan format where appropriate. Please see avs for details which were reviewed in writing by both me and my nurse and patient given a written copy highlighted where appropriate with yellow highlighter for the patient's continued care at home along with an updated version of their medications.  Patient was asked to maintain medication reconciliation by comparing this list to the actual medications being used at home and to contact this office right away if there is a conflict or discrepancy.

## 2020-02-15 NOTE — Telephone Encounter (Signed)
Can mohs possibly get both in 1 surgery

## 2020-02-21 NOTE — Telephone Encounter (Signed)
-----  Message from Stuart Tafeen, MD sent at 02/11/2020  9:01 AM EDT ----- JCB obtained biopsies and should provide input on treatment.  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 

## 2020-02-22 ENCOUNTER — Telehealth: Payer: Self-pay

## 2020-02-22 ENCOUNTER — Telehealth: Payer: Self-pay | Admitting: *Deleted

## 2020-02-22 NOTE — Telephone Encounter (Signed)
[name] was working on lymphedema consult for her

## 2020-02-22 NOTE — Telephone Encounter (Signed)
-----  Message from Stuart Tafeen, MD sent at 02/11/2020  9:01 AM EDT ----- JCB obtained biopsies and should provide input on treatment.  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 

## 2020-02-22 NOTE — Telephone Encounter (Signed)
-----  Message from Lavonna Monarch, MD sent at 02/11/2020  9:01 AM EDT ----- JCB obtained biopsies and should provide input on treatment.  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

## 2020-02-22 NOTE — Telephone Encounter (Signed)
Spoke with [name] bruning regarding treatment for patients leg: the only place that treats lymphedema is [facility] rehab [phone]) or [name] [phone]). Patient states that she will have to talk with friend because that's who takes her to Doctors appointment.  She was going to ask friend which place would be easier for her.  Patient states she will call us back once she decides on what she wants to do.  I told her once she decides then we will do the referral for treatment/therapy.

## 2020-03-05 ENCOUNTER — Telehealth: Payer: Self-pay | Admitting: Physician Assistant

## 2020-03-05 DIAGNOSIS — I890 Lymphedema, not elsewhere classified: Secondary | ICD-10-CM

## 2020-03-05 NOTE — Telephone Encounter (Signed)
Per patient she was to call back to let JCB know where to refer her to for treatment of swelling of legs. She would like to be seen in [place].

## 2020-03-07 NOTE — Telephone Encounter (Signed)
Called patient and left message letting her know I sent Referral to [facility] [phone]) for lymphedema & that they should  Contact patient for appointment.

## 2020-03-16 ENCOUNTER — Other Ambulatory Visit: Payer: Self-pay | Admitting: Physician Assistant

## 2020-03-19 ENCOUNTER — Other Ambulatory Visit: Payer: Self-pay | Admitting: Physician Assistant

## 2020-03-19 DIAGNOSIS — M7989 Other specified soft tissue disorders: Secondary | ICD-10-CM

## 2020-03-20 ENCOUNTER — Ambulatory Visit
Admission: RE | Admit: 2020-03-20 | Discharge: 2020-03-20 | Disposition: A | Discharge status: Home / Self Care | Payer: Medicare Other | Source: Ambulatory Visit | Attending: Physician Assistant | Admitting: Physician Assistant

## 2020-03-20 DIAGNOSIS — M7989 Other specified soft tissue disorders: Secondary | ICD-10-CM

## 2020-03-30 ENCOUNTER — Telehealth: Payer: Self-pay | Admitting: Internal Medicine

## 2020-03-30 NOTE — Telephone Encounter (Signed)
Called and spoke with patient provided instructions per Rexene Edison NP.  Advised I would have to call in the script as our electronic system is down.  She verbalized understanding.  Z-pack script called in, spoke with Judson Roch.  Nothing further needed.

## 2020-03-30 NOTE — Telephone Encounter (Signed)
Spoke with the pt  She is c/o increased cough and SOB x 10 days  She is wheezing also and her cough has been prod with light green sputum  She is still on all of her inhalers/nebs and has been taking mucinex bid, also using her flutter valve  She has had her flu vaccine and covid vaccines  Denies any f/c/s, body aches  Please advise, thanks!  Allergies  Allergen Reactions  . Tramadol Other (See Comments)    GI UPSET

## 2020-03-30 NOTE — Telephone Encounter (Signed)
Can send in a Z-Pak #1 take as directed Continue with Mucinex and maintenance regimen. If symptoms are not improving will need office visit for further evaluation and treatment options Also recommend COVID-19 testing with acute respiratory symptoms  Please contact office for sooner follow up if symptoms do not improve or worsen or seek emergency care

## 2020-04-09 ENCOUNTER — Ambulatory Visit (HOSPITAL_COMMUNITY): Payer: Medicare Other | Attending: Physician Assistant | Admitting: Physical Therapy

## 2020-04-11 ENCOUNTER — Ambulatory Visit (HOSPITAL_COMMUNITY): Payer: Medicare Other | Admitting: Physical Therapy

## 2020-04-11 ENCOUNTER — Telehealth (HOSPITAL_COMMUNITY): Payer: Self-pay | Admitting: Physical Therapy

## 2020-04-11 NOTE — Telephone Encounter (Signed)
She cx on phone tree 10/27- s/w to let her know this will be the third time we have r/s and ask if she really wanted to comment. Pt said she wanted to come. R/s for 11/3

## 2020-04-12 ENCOUNTER — Ambulatory Visit (HOSPITAL_COMMUNITY): Payer: Medicare Other | Admitting: Physical Therapy

## 2020-04-13 ENCOUNTER — Encounter (HOSPITAL_COMMUNITY): Payer: Medicare Other

## 2020-04-16 ENCOUNTER — Ambulatory Visit (HOSPITAL_COMMUNITY): Payer: Medicare Other | Admitting: Physical Therapy

## 2020-04-18 ENCOUNTER — Ambulatory Visit (HOSPITAL_COMMUNITY): Payer: Medicare Other | Admitting: Physical Therapy

## 2020-04-18 ENCOUNTER — Telehealth (HOSPITAL_COMMUNITY): Payer: Self-pay | Admitting: Physical Therapy

## 2020-04-18 NOTE — Telephone Encounter (Signed)
pt called to cancel her appt due to she stated that she have diarrehea

## 2020-04-20 ENCOUNTER — Ambulatory Visit (HOSPITAL_COMMUNITY): Payer: Medicare Other

## 2020-04-23 ENCOUNTER — Encounter (HOSPITAL_COMMUNITY): Payer: Medicare Other | Admitting: Physical Therapy

## 2020-04-25 ENCOUNTER — Encounter (HOSPITAL_COMMUNITY): Payer: Medicare Other | Admitting: Physical Therapy

## 2020-04-27 ENCOUNTER — Encounter (HOSPITAL_COMMUNITY): Payer: Medicare Other

## 2020-04-30 ENCOUNTER — Encounter (HOSPITAL_COMMUNITY): Payer: Medicare Other | Admitting: Physical Therapy

## 2020-05-02 ENCOUNTER — Encounter (HOSPITAL_COMMUNITY): Payer: Medicare Other | Admitting: Physical Therapy

## 2020-05-04 ENCOUNTER — Other Ambulatory Visit: Payer: Self-pay

## 2020-05-04 ENCOUNTER — Ambulatory Visit (HOSPITAL_COMMUNITY): Payer: Medicare Other | Attending: Physician Assistant | Admitting: Physical Therapy

## 2020-05-04 DIAGNOSIS — M79604 Pain in right leg: Secondary | ICD-10-CM

## 2020-05-04 DIAGNOSIS — S81801S Unspecified open wound, right lower leg, sequela: Secondary | ICD-10-CM

## 2020-05-04 DIAGNOSIS — M79605 Pain in left leg: Secondary | ICD-10-CM | POA: Diagnosis present

## 2020-05-04 NOTE — Therapy (Signed)
[name], [name], [address] Phone: [phone]   Fax:  [phone]  Wound Care Evaluation  Patient Details  Name: [name] MRN: [id number] Date of Birth: [birth year] Referring Provider (PT): [name]   Encounter Date: 05/04/2020   PT End of Session - 05/04/20 1648    Visit Number 1    Number of Visits 8    Date for PT Re-Evaluation 06/03/20    Authorization Type Medicare    Progress Note Due on Visit 8    PT Start Time 1400    PT Stop Time 1530    PT Time Calculation (min) 90 min           Past Medical History:  Diagnosis Date  . Abrasion    under left knee x 2 places changing dressing q day of bid, applying ssd cream area healing due to fell 2 weeks ago  . Blood dyscrasia    BLEEDS EASY  . Bruises easily    bleeds easily  . Closed patellar sleeve fracture of left knee 01/04/2018  . Closed patellar sleeve fracture of right knee 01/04/2018   healing   . COPD (chronic obstructive pulmonary disease) ([place])   . Coronary artery disease    per dr [name] note 01-01-18 note  . Hernia, inguinal    LEFT  . Hypertension    states under control with meds., has been on med. > 10 yr.  . On home oxygen therapy    at night 2L/min  . Pneumonia    "SEVERAL TIMES IN PAST, LAST TIME 2018"  . Pulmonary hypertension ([place])    per dr [name] 7-19 19 lov note  . Shortness of breath dyspnea    with exertion  . SVD (spontaneous vaginal delivery)    x 1  . Thin skin   . Urinary tract infection 2019  . Wears partial dentures    lower partial and upper plate    Past Surgical History:  Procedure Laterality Date  . COLONOSCOPY     Less than 10 years ago per patient   . CYSTOCELE REPAIR N/A 03/02/2018   Procedure: ANTERIOR REPAIR (CYSTOCELE);  Surgeon: Crystal Fowler, MD;  Location: [facility];  Service: Gynecology;  Laterality: N/A;  OUTPATIENT IN BED  . MULTIPLE TOOTH EXTRACTIONS    .  RESECTION DISTAL CLAVICAL Left 10/12/2014   Procedure: DISTAL CLAVICLE EXCISION;  Surgeon: Crystal Hitch, MD;  Location: [facility];  Service: Orthopedics;  Laterality: Left;  . SHOULDER ARTHROSCOPY WITH ROTATOR CUFF REPAIR AND SUBACROMIAL DECOMPRESSION Left 10/12/2014   Procedure: LEFT SHOULDER ARTHROSCOPY, DEBRIDEMENT WITH ACROMIOPLASTY,  ROTATOR CUFF REPAIR AND RELEASE BICEPS TENDON;  Surgeon: Crystal Hitch, MD;  Location: [facility];  Service: Orthopedics;  Laterality: Left;    There were no vitals filed for this visit.   Subjective Assessment - 05/04/20 1411    Subjective [name] states that she has been diagnosed with lymphedema .  She has gone to the wound center in [place] about six months ago, she went for about once a week for 15 weeks .  She has had comprssion stockings but she can not really put them on due to having COPD so bad.  Pt states that she has pain in her feet and legs at times they are so bad that she can not walk.  She states that her legs get really swollen and then the will go down.  Pertinent History COPD    Patient Stated Goals no wounds to keep the swelling out of her legs    Currently in Pain? Yes    Pain Score 7     Pain Location Foot    Pain Orientation Right;Left    Pain Descriptors / Indicators Aching;Sore    Pain Type Chronic pain    Pain Onset More than a month ago    Pain Frequency Constant    Aggravating Factors  legs in a dependent position    Pain Relieving Factors elevation    Effect of Pain on Daily Activities limits            [facility] PT Assessment - 05/04/20 0001      Assessment   Medical Diagnosis B wounds;referred for lymphedema but therapist does not believe that this is the case     Referring Provider (PT) [name]    Onset Date/Surgical Date 10/29/18    Next MD Visit unknown     Prior Therapy wound care       Precautions   Precautions --   multiple bouts of cellulitis      Balance  Screen   Has the patient fallen in the past 6 months Yes    How many times? 1    Has the patient had a decrease in activity level because of a fear of falling?  Yes    Is the patient reluctant to leave their home because of a fear of falling?  No      Home Environment   Living Environment Private residence      Prior Function   Level of Independence Independent      Cognition   Overall Cognitive Status Within Functional Limits for tasks assessed           Wound Therapy - 05/04/20 1411    Subjective Pt states that she has been to multiple MD, wound center and the last MD she saw felt that she had lymphedema.      Patient and Family Stated Goals wounds to heal, less pain     Date of Onset --   approximately 18 months ago   Prior Treatments MD, antibiotic cream     Evaluation and Treatment Procedures Explained to Patient/Family Yes    Evaluation and Treatment Procedures agreed to    Wound Properties Date First Assessed: 05/04/20 Time First Assessed: 1442 Wound Type: Other (Comment) Location: Leg Location Orientation: Right;Anterior Wound Description (Comments): anterior aspect initally covered with slough; multiple miniscle open areas  Present on Admission: Yes   Dressing Type None    Dressing Change Frequency PRN    Site / Wound Assessment Painful    % Wound base Red or Granulating 90%    % Wound base Yellow/Fibrinous Exudate 10%    Peri-wound Assessment Edema;Erythema (blanchable)    Wound Length (cm) 2.5 cm   and multiple miniscule openings    Wound Width (cm) 3.5 cm    Wound Surface Area (cm^2) 8.75 cm^2    Drainage Amount Scant    Drainage Description Serous    Treatment Cleansed;Debridement (Selective);Other (Comment)    Wound Properties Date First Assessed: 05/04/20 Time First Assessed: 1500 Wound Type: Other (Comment) Location: Leg Location Orientation: Left;Anterior Wound Description (Comments): multiple miniscue openings  Present on Admission: Yes   Dressing Type None      Dressing Changed New    Dressing Status None    Dressing Change Frequency PRN    Site /  Wound Assessment Friable;Yellow    Peri-wound Assessment Edema;Erythema (blanchable)    Wound Length (cm) --   no large wound , greater than 20 minsucle openings    Drainage Amount None    Treatment Cleansed;Debridement (Selective)    Selective Debridement - Location entire leg as there are over 50 scabs over LE     Selective Debridement - Tools Used Scalpel;Scissors    Selective Debridement - Tissue Removed devitalized tissue, hair, dirt.     Wound Therapy - Clinical Statement Ms. Hallett has been to numerous MD's, had antibiotic and has been seen at the wound center for months for wounds on both of her LE, she now comes to this department with a diagnosis of lymphedema.  The evaluating therapist is a certified lymphedema therapist as well as a wound associate and feels that the wounds on Ms. Dohner are not from lymphedema, however, they will need skilled care to obtain an healing environment.  At the time of evaluation Ms. Pippins LE were covered in an abundunt amount of yellow scabs consisting of devitalized tissue, hair and dirt.  Her LE were red and had some but not excessive swelling.  Her LE were cleanse and as many areas debrided as able most of the scabs were able to be readily removed.  The LE were cleansed with xeroform placed over the open areas followed by kling, and coban from toe to knee B.  There were many raised areas that appear to be fungal in nature.  Ms. Pike and her caregiver were instructed to cleanse her LE daily, place antifungal cream on her LE , moisturize and wear the juxtafit given to her at the wound center on a daily basis.  Due to transportation issues she is unable to come on a regular basis but will be seen back in this clinic in two weeks to assess how treatment is affecting  her LE, if healing is not occuring we will see pt 2x a week for 4 weeks.      Wound Therapy - Functional  Problem List unable to dress, painful to [facility]    Factors Delaying/Impairing Wound Healing Altered sensation;Diabetes Mellitus;Infection - systemic/local;Vascular compromise    Hydrotherapy Plan Debridement;Dressing change;Patient/family education    Wound Therapy - Frequency 2X / week    Wound Therapy - Current Recommendations PT    Wound Plan Assess how antifungal cream has done to decrease growths on LE>     Dressing  xeroform, 4x4, kling and coban f/b nettng                  Objective measurements completed on examination: See above findings.               PT Short Term Goals - 05/04/20 1652      PT SHORT TERM GOAL #1   Title PT to have no more than 3 small  open wound on each LE    Time 3    Period Weeks    Status New    Target Date 05/25/20      PT SHORT TERM GOAL #2   Title Pt to be completing her HEP to improve circulation in LE    Time 3    Period Weeks    Status New             PT Long Term Goals - 05/04/20 2048      PT LONG TERM GOAL #1   Title PT to have no open wounds  on either LE    Time 5   PT will not return for 2 weeks   Period Weeks    Status New    Target Date 06/08/20      PT LONG TERM GOAL #2   Title PT to be wearing and understand that she needs to wear her juxtafit on a daily basis.    Time 5    Period Weeks    Status New      PT LONG TERM GOAL #3   Title PT pain in her LE to be no greater than a 2/10 to allow pt to be able to sit for an hour in comfort    Time 5    Period Weeks    Status New                Plan - 05/04/20 1649    Clinical Impression Statement see above    Personal Factors and Comorbidities Past/Current Experience;Time since onset of injury/illness/exacerbation    Examination-Activity Limitations Dressing;Hygiene/Grooming;Locomotion Level    Examination-Participation Restrictions Other    Stability/Clinical Decision Making Evolving/Moderate complexity    Clinical Decision Making Moderate     Rehab Potential Good    PT Frequency 2x / week    PT Duration 4 weeks    PT Treatment/Interventions Other (comment);Patient/family education;Manual techniques;Manual lymph drainage   debridement   PT Next Visit Plan assess wounds, see if pt has been wearing her juxtafit, answer any questions, Debride and dressing change as needed ; give ankle pump, circles , LAQ for HEP    PT Home Exercise Plan explained to get a fungal cream, cleanse, moisturize, place fungal cream followed by application of juxtafit everyday.           Patient will benefit from skilled therapeutic intervention in order to improve the following deficits and impairments:  Decreased skin integrity, Pain, Increased edema  Visit Diagnosis: Pain in right leg  Pain in left leg  Leg wound, right, sequela    Problem List Patient Active Problem List   Diagnosis Date Noted  . Chest pain, musculoskeletal 02/14/2020  . Need for vaccination 10/05/2019  . Rheumatoid factor positive 09/19/2019  . 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 Protein-calorie malnutrition, severe 08/20/2019  . Severe sepsis ([place]) 08/19/2019  . Acute lower UTI 08/19/2019  . Pressure injury of skin 08/19/2019  . ARF (acute renal failure) ([place]) 08/18/2019  . Hyperkalemia 08/18/2019  . Transaminitis 08/18/2019  . Generalized weakness 08/18/2019  . Leukocytosis 08/18/2019  . Anemia 08/18/2019  . Acute renal failure (ARF) ([place]) 08/18/2019  . Chronic respiratory failure with hypoxia and hypercapnia ([place]) 06/02/2018  . COPD with acute exacerbation ([place]) 05/09/2018  . Hyponatremia 05/09/2018  . Acute urinary retention 05/09/2018  . Cystocele with prolapse 03/02/2018  . Cor pulmonale, chronic ([place]) 06/29/2017  . Chronic respiratory failure with hypoxia ([place]) 06/30/2014  . Pulmonary infiltrates 01/26/2013  . Multiple pulmonary nodules determined by computed tomography of lung 08/19/2012  . Cancer screening 05/12/2012  . COPD exacerbation ([place])  05/12/2012  . Essential hypertension 03/28/2012  . COPD GOLD III 12/02/2011  . Chronic cough 12/02/2011  . Cigarette smoker 12/02/2011    Rayetta Humphrey, PT CLT [phone] 05/04/2020, 8:50 PM  [facility] [address] [place], [name], [address] Phone: [phone]   Fax:  [phone]  Name: [name] MRN: [id number] Date of Birth: 02/28/1946

## 2020-05-07 ENCOUNTER — Ambulatory Visit (HOSPITAL_COMMUNITY): Payer: Medicare Other | Admitting: Physical Therapy

## 2020-05-07 ENCOUNTER — Telehealth: Payer: Self-pay | Admitting: Internal Medicine

## 2020-05-07 MED ORDER — ALBUTEROL SULFATE HFA 108 (90 BASE) MCG/ACT IN AERS
2.0000 | INHALATION_SPRAY | RESPIRATORY_TRACT | 6 refills | Status: AC | PRN
Start: 2020-05-07 — End: ?

## 2020-05-07 NOTE — Telephone Encounter (Signed)
Spoke with the pt  She is needing a refill on her albuterol inhaler  The one that she has expired  Rx was sent to [facility]

## 2020-05-09 ENCOUNTER — Ambulatory Visit (HOSPITAL_COMMUNITY): Payer: Medicare Other | Admitting: Physical Therapy

## 2020-05-14 ENCOUNTER — Ambulatory Visit (HOSPITAL_COMMUNITY): Payer: Medicare Other | Admitting: Physical Therapy

## 2020-05-15 ENCOUNTER — Ambulatory Visit: Payer: Medicare Other | Admitting: Internal Medicine

## 2020-05-15 ENCOUNTER — Telehealth (HOSPITAL_COMMUNITY): Payer: Self-pay | Admitting: Physical Therapy

## 2020-05-15 NOTE — Telephone Encounter (Signed)
She is having diarrhea and can not come for her next apptment

## 2020-05-16 ENCOUNTER — Ambulatory Visit (HOSPITAL_COMMUNITY): Payer: Medicare Other | Admitting: Physical Therapy

## 2020-05-18 ENCOUNTER — Encounter (HOSPITAL_COMMUNITY): Payer: Medicare Other

## 2020-05-21 ENCOUNTER — Telehealth (HOSPITAL_COMMUNITY): Payer: Self-pay | Admitting: Physical Therapy

## 2020-05-21 ENCOUNTER — Ambulatory Visit (HOSPITAL_COMMUNITY): Payer: Medicare Other | Admitting: Physical Therapy

## 2020-05-21 NOTE — Telephone Encounter (Signed)
pt cancelled appt for today because she can barely walk

## 2020-05-23 ENCOUNTER — Ambulatory Visit (HOSPITAL_COMMUNITY): Payer: Medicare Other | Attending: Physician Assistant | Admitting: Physical Therapy

## 2020-05-23 ENCOUNTER — Telehealth (HOSPITAL_COMMUNITY): Payer: Self-pay | Admitting: Physical Therapy

## 2020-05-23 NOTE — Telephone Encounter (Signed)
No show #1 Pt called left message on answering machine re next appointment will be on 05/28/2020 at 1315.  Rayetta Humphrey, [place] CLT [phone]

## 2020-05-25 ENCOUNTER — Encounter (HOSPITAL_COMMUNITY): Payer: Medicare Other | Admitting: Physical Therapy

## 2020-05-28 ENCOUNTER — Telehealth (HOSPITAL_COMMUNITY): Payer: Self-pay | Admitting: Physical Therapy

## 2020-05-28 ENCOUNTER — Ambulatory Visit (HOSPITAL_COMMUNITY): Payer: Medicare Other | Admitting: Physical Therapy

## 2020-05-28 NOTE — Telephone Encounter (Signed)
pt's caregiver called sanji stetzer to let us know that her pt hasd a kidney infection and is on antibotics and can hardley breathe and her o2 is very low and they would prefer to wait untill the first of the year to reschedule.

## 2020-05-29 ENCOUNTER — Telehealth: Payer: Self-pay | Admitting: Internal Medicine

## 2020-05-29 NOTE — Telephone Encounter (Signed)
Called and spoke with pt and she is aware of MW recs.  She voiced her understanding and nothing further is needed.

## 2020-05-29 NOTE — Telephone Encounter (Signed)
Called and spoke with pt to see if she ever did an albuterol neb tx and she said that she did. Pt said that it has helped her breathing some. Pt said her O2 sats are still ranging 88-90% on her 3L O2. Pt is still having problems with wheezing.  Routing this new info to Dr. Melvyn Novas.

## 2020-05-29 NOTE — Telephone Encounter (Signed)
Called and spoke to pt. Pt states she hasnt taken the albuterol neb yet. Advised her to take it now and we will call back in an hour, 3pm. Pt verbalized understanding.

## 2020-05-29 NOTE — Telephone Encounter (Signed)
Called and spoke to pt. Pt c/o increase in SOB, dyspneic while on phone, could not complete full sentences, audibly wheezing. Pt c/o intermittent chest tightness, prod cough with thick clear mucus, rhinorrhea, weakness, fatigue. Pt states her s/s presented about 5 days ago. Pt denies f/c/s. Pt checked her spo2 while on the phone, it was 90% on 3.5lpm at rest, when pt moved (not walked) to grab BP machine her O2 dropped to 78% on 3.5lpm, took pt a several minutes to recover to 88-89%. Pts BP was 140/67, HR was 91. Pt was scripted Keflex (not cipro as mentioned before) on 12.9.21 for UTI. Pt is fully vaccinated against COVID with no covid contacts. Pt refused to go to hospital for eval. Pt refused an appt today (TP had an opening at 2pm). Pt is requesting recs.    Dr. Melvyn Novas, please advise. Thanks.

## 2020-05-29 NOTE — Telephone Encounter (Signed)
Adjust the flow of 02 to keep over 90% and ok to repeat the neb up to every 4 hours as needed  - if not working go to er

## 2020-05-29 NOTE — Telephone Encounter (Signed)
Called and spoke to pt. Pt states she will try her albuterol neb and see if it helps. Will call pt back to evaluate.

## 2020-05-29 NOTE — Telephone Encounter (Signed)
Also, pt has being treated for UTI & is currently taking ABX. Dala Dock is who called this in & his # [phone]

## 2020-05-29 NOTE — Telephone Encounter (Signed)
If not responding to neb albuterol and sob at rest  then nothing acutely will help and needs to go to er   if albuterol nebs helping >>> Ok to adjust 02 to sats > 90% and double prednisone until better

## 2020-05-29 NOTE — Telephone Encounter (Signed)
Called and spoke to pt's friend, Becky Sax. She states the pt has had an increase in SOB and is unable to move much more than from the bed to the couch to the bathroom. Pt was on 3lpm O2 at last OV in August. Sonja states the pt has been recently maintaining spo2 on 4lpm O2 but just in the last day or two pt has required 5lpm O2 and is still unable to maintain above 90% at rest. Pt becomes very dyspneic when any activity so pt tries not to move, per [place]. Pt was recently diagnosed with a UTI and is on Cipro. Pt's SBP has been in the 70s. Per Becky Sax, pt is very fatigued. ATC pt on both lines, mobile and home, unable to reach her. LM on mobile number. Becky Sax was not at pts home at the time of the call. Norva Pavlov it sounds as though pt would need to seek emergency care, Sonja agreed pts condition is much worse and thinks pt needs emergency care. Advised Sonja to check on pt and to call us back. Pt will likely need to go to ED for eval. Will need to follow back up by 1pm.  Will keep in triage and follow back up by 1pm today, 05/29/20.

## 2020-05-30 ENCOUNTER — Ambulatory Visit (HOSPITAL_COMMUNITY): Payer: Medicare Other

## 2020-05-30 ENCOUNTER — Encounter (HOSPITAL_COMMUNITY): Payer: Self-pay | Admitting: Physical Therapy

## 2020-05-30 NOTE — Therapy (Signed)
[facility] [place], [place], [address] Phone: [phone]   Fax:  [phone]  Patient Details  Name: [name] MRN: [id number] Date of Birth: Feb 10, 1946 Referring Provider:  No ref. provider found  Encounter Date: 05/30/2020    PHYSICAL THERAPY DISCHARGE SUMMARY  Visits from Start of Care: 1  Current functional level related to goals / functional outcomes: unknown Remaining deficits: Unknown    Education / Equipment: Cleanse LE use compression  Pt seen one time only has cancelled all other appointments.  Plan: Patient agrees to discharge.  Patient goals were not met. Patient is being discharged due to a change in medical status.  ?????      Rayetta Humphrey, PT CLT [phone] 05/30/2020, 3:27 PM  [facility] [address] [place], [place], [address] Phone: [phone]   Fax:  [phone]

## 2020-06-01 ENCOUNTER — Encounter (HOSPITAL_COMMUNITY): Payer: Medicare Other

## 2020-06-04 ENCOUNTER — Ambulatory Visit (HOSPITAL_COMMUNITY): Payer: Medicare Other | Admitting: Physical Therapy

## 2020-06-06 ENCOUNTER — Encounter (HOSPITAL_COMMUNITY): Payer: Medicare Other

## 2020-06-12 ENCOUNTER — Encounter (HOSPITAL_COMMUNITY): Payer: Medicare Other

## 2020-06-22 ENCOUNTER — Other Ambulatory Visit: Payer: Self-pay

## 2020-06-22 ENCOUNTER — Ambulatory Visit (INDEPENDENT_AMBULATORY_CARE_PROVIDER_SITE_OTHER): Payer: Medicare Other | Admitting: Internal Medicine

## 2020-06-22 ENCOUNTER — Encounter: Payer: Self-pay | Admitting: Internal Medicine

## 2020-06-22 DIAGNOSIS — J9611 Chronic respiratory failure with hypoxia: Secondary | ICD-10-CM | POA: Diagnosis not present

## 2020-06-22 DIAGNOSIS — J9612 Chronic respiratory failure with hypercapnia: Secondary | ICD-10-CM

## 2020-06-22 DIAGNOSIS — I2781 Cor pulmonale (chronic): Secondary | ICD-10-CM

## 2020-06-22 DIAGNOSIS — J449 Chronic obstructive pulmonary disease, unspecified: Secondary | ICD-10-CM | POA: Diagnosis not present

## 2020-06-22 MED ORDER — PREDNISONE 10 MG PO TABS: ORAL_TABLET | ORAL | 11 refills | Status: DC

## 2020-06-22 MED ORDER — AZITHROMYCIN 250 MG PO TABS: ORAL_TABLET | ORAL | 11 refills | Status: DC

## 2020-06-22 NOTE — Patient Instructions (Addendum)
Plan A = Automatic = Always=    Brovana/budesonide twice daily and yupelri once daily per neb     Plan B = Backup (to supplement plan A, not to replace it) Only use your albuterol nebulizer as a rescue medication to be used if you can't catch your breath by resting or doing a relaxed purse lip breathing pattern.  - The less you use it, the better it will work when you need it. - Ok to use the inhaler up to 2 puffs  every 4 hours if you must but call for appointment if use goes up over your usual need - Don't leave home without it !!  (think of it like the spare tire for your car)   Plan C = Crisis - Prednisone 10 mg take  4 each am x 2 days,   2 each am x 2 days,  1 each am x 2 days and stop     For thick mucous > mucinex 1200 mg every 12 hours with the flutter valve as much as possible  For nasty mucus > zpak   Goal is to keep the 02 saturation above 90% at all times    Please schedule a follow up visit in 3 months but call sooner if needed

## 2020-06-22 NOTE — Progress Notes (Signed)
Subjective:   Patient ID: [name], female    DOB: August 22, 1945   MRN: [id number]   Brief patient profile:  75 yowf MM variably reports smoking cessation   followed in pulmonary clinic for GOLD III copd   History of Present Illness  11/05/2015  f/u ov/Elyjah Hazan re: GOLD III / still smoking / maint rx symb/spriiva dpi  Chief Complaint  Patient presents with  . Follow-up    pt states breathing is baseline. c/o cont SOB, prod cough tan to greenish in color, wheezing, cp/tightness  dyspnea is variable and has portable tanks but never uses them to see if makes any difference in her variable doe / using saba up to 4 x daily instead.   rec You are qualified for portable 02 if you wish but no need to wear it at rest or walking around the house - continue it at bedtime thru the concentrator at 2lpm and wear the portable system as needed during the day Ok to finish up the powder spiriva and just refill the respimat    12/24/2016  f/u ov/Marcial Pless re:  COPD GOLD III/ still smoking/ symb/spiriva / 02 2lpm hs only  Chief Complaint  Patient presents with  . Follow-up    Pt states she is still having increase sob with exerton, she still has a slight cough with clear mucus with a tint of blood, lots of wheezing,Denies chest tightness,fever   using saba hfa up to twice daily but no needing noct Doe = MMRC3 = can't walk 100 yards even at a slow pace at a flat grade s stopping due to sob   Has slt bloody nasal discharge in am and scant blood streaked sputum also, just in am and just when nose is bleeding too    11/11/2017  f/u ov/Iran Kievit re:  GOLD III/ 02 hs prn  Chief Complaint  Patient presents with  . Follow-up    Breathing is about the same. She is using her albuterol inhaler 2-3 x per wk on average and she rarely uses neb.   Dyspnea:   No change in doe = MMRC3  Cough: not much at all  Sleep: flat on 2lpm  SABA use:  As above Sleeping  Ok on 2lpm flat   rec Discuss the fluid pills with  Dr Redmond Pulling  but I will recommend he consider adding aldactone if not improving on lasix alone but have to be very careful with potassium monitoring with these changes so I will defer to him  Please schedule a follow up visit in 6 months but call sooner if needed - referred to rehab     05/03/18 acute ov/ NP aecopd rec Seen  for COPD exacerbation secondary to URI Office treatment: Xopenex neb treatment x1 today Rx: Prednisone taper as prescribed Z-Pak as prescribed Recommendations: Used Albuterol nebulizer every 6 hours as needed for shortness of breath/wheezing for the next 3 to 5 days until better You can return to work as long as you are afebrile for 24 hours Stay well-hydrated, encourage 4-6 glass of fluids (8 oz)    Admit date: 05/08/2018 Discharge date: 05/20/2018   Discharge Diagnoses:      Active Hospital Problems   Diagnosis Date Noted  . COPD with acute exacerbation ([place]) 05/09/2018  . Hyponatremia 05/09/2018  . Acute urinary retention 05/09/2018  . Chronic respiratory failure with hypoxia ([place]) 06/30/2014  . Essential hypertension 03/28/2012    History of present illness:  [name] a 75 y.o.femalewith medical  history significant forCOPD with chronic hypoxic respiratory failure, hypertension, and chronic bilateral leg swelling and ulcerations, now presenting to the emergency department for evaluation of shortness of breath and productive cough. Patient was seen in her pulmonology clinic on 05/03/2018 with productive cough and increased dyspnea, was diagnosed with acute exacerbation in COPD and started on prednisone and azithromycin. She failed to improve, was seen back on 05/06/2018 and reports that her prednisone course was extended. Unfortunately, she still has not had any significant improvement and may actually be worsening. She has been dyspneic at rest for the past couple days and has had difficulty sleeping due to this. She denies fevers, chills, or chest  pain. Reports that her chronic bilateral lower extremity swelling is not as bad as usual. Pt admitted for further management   Today, pt reported feeling better, was able to ambulate with PT, with noted desaturations requiring home O2. Pt advised to use home O2 constantly (at rest and ambulation), not just at bedtime. Pt discharged on a long taper of steroids and advised to follow up closely with PCP and pulmonary. Stable for d/c with [facility] Hospital Course:  Principal Problem:   COPD with acute exacerbation ([place]) Active Problems:   Essential hypertension   Chronic respiratory failure with hypoxia ([facility])   Hyponatremia   Acute urinary retention  Acute on chronic hypoxic respiratory failure with COPD Exacerbation -Presented with worsening shortness of breath, cough despite being treated with prednisone and azithromycin since 05/03/2018 - long hospital stay with slow improvement, admitted on 11/23 and required BIPAP at admission, and has completed course of abx with levaquin on 11/27. She's been transitioned from IV solumedrol to PO prednisone -Continue duoneb, inhaler, tapered dose of prednisone -Completed course of Levaquin  -Continues to require supplemental O2 (3 L now, was previously on 2 L at night only) -CXR 11/30, showing improvement -Follow up with PCP and Dr Melvyn Novas (pulmonary)  Hyponatremia -resolved  Essential hypertension -Continue diltiazem, losartan  Alcohol use -Continue folic acid, thiamine, multivitamin   Leukocytosis  -likely due to steroids  Hyperglycemia -no history of diabetes  -hemoglobin A1c 6.1  Deconditioning -secondary to respiratory status/illness -PT evaluated patient, no further needs -OT recommended [facility]     06/01/2018  Post hosp f/u ov/Lyah Millirons re: transition of care:   Aecopd/ 02 dep 24/7  Chief Complaint  Patient presents with  . Hospitalization Follow-up    Breathing has improved some but not back to her normal baseline. She  is using her o2 3lpm 24/7.  She has not used her albuterol inhaler but has been using her albuterol neb once daily on average.   Dyspnea:  MMRC4  = sob if tries to leave home or while getting dressed   Cough: some rattling  Sleeping: horizontal on back ok SABA use: avg neb once  02: 3lpm 24/7 rec 3lpm at bedtime, none at rest and turn up your 02 to keep it above 90% with activity  Plan A = Automatic = symbicort and spiriva  Plan B = Backup Only use your albuterol inhaler as a rescue medication   Plan C = Crisis - only use your albuterol nebulizer if you first try Plan B and it fails to help > ok to use the nebulizer up to every 4 hours but if start needing it regularly call for immediate appointment For cough/ congestion >  mucinex or mucinex dm up to 1200 mg every 12 hours with flutter valve  Work on inhaler technique:  If not improving >  Prednisone 10 mg take  4 each am x 2 days,   2 each am x 2 days,  1 each am x 2 days and stop         08/11/2018  Acute ov/Nathanuel Cabreja re:  Copd GOLD III/ cor pulmonale:  symb 160 /spiriva / 02 hs and prn day Has pred as plan D but confused when and how to use it  Chief Complaint  Patient presents with  . Acute Visit    runny nose- min green nasal d/c and increased cough- started last night. She is feeling more SOB today.   Dyspnea:  No increase Cough: more congested in am and turning slt more yellow on day of ov  Sleeping: bed is flat/ one pillow SABA use: not using at all despite  02: 3lpm hs and as needed during the day but not titrating as rec  rec  For increased / wheezing/ cough or need for albuterol > prednisone 10 mg x 2 until better then 1 daily x 3 days and stop Monitor your 02 level with exertion with goal of keeping over 90%     01/12/2019  f/u ov/Marchelle Rinella re: GOLD III/ cor pulmonale maint on symb/spiriva  Chief Complaint  Patient presents with  . Follow-up    F/U YI:FOYD. She reports her breathing has not been well with the hot weather.  Currenlty taking Keflex for left leg cellulitis and knee infection.   Dyspnea:  Not going out much / room to room ok Cough: still has "smoker's rattle" cough worse in am >  thick white 1-2 tsp Sleeping: bed is flat, one pillow SABA use: sev times a day when overdoes it  02: 3lpm hs and prn daytime  rec For increased / wheezing/ cough or need for albuterol > prednisone 10 mg x 2 until better then 1 daily x 3 days and stop Monitor your 02 level with exertion with goal of keeping over 90%    Admitted to [facility] > snf p out around 10/21/19   11/07/2019  f/u ov/Gardiner Espana re: last smoked one day prior/ thoroughly confused with details of care   Chief Complaint  Patient presents with  . Follow-up    pt is having sob using rescue inhaler 2 to 3 times aweek  Dyspnea:  Across the room gives out on 2-3 lpm but does not check sats walking  Cough: no Sleeping: still on couch with one pillow  SABA use: as above  02: 2-3lpm  rec Plan A = Automatic = Always=    Breztri Take 2 puffs first thing in am and then another 2 puffs about 12 hours later (do not use symbicort / spiriva)  Work on inhaler technique:  relax and gently blow all the way out then take a nice smooth deep breath back in, triggering the inhaler at same time you start breathing in.  Hold for up to 5 seconds if you can. Blow out thru nose. Rinse and gargle with water when done Plan B = Backup (to supplement plan A, not to replace it) Only use your albuterol inhaler as a rescue medication to be used if you can't catch your breath by resting or doing a relaxed purse lip breathing pattern.  - The less you use it, the better it will work when you need it. - Ok to use the inhaler up to 2 puffs  every 4 hours if you must but call for appointment if use goes up over  your usual need - Don't leave home without it !!  (think of it like the spare tire for your car)  Plan C = Crisis (instead of Plan B but only if Plan B stops working) - only use your albuterol  nebulizer if you first try Plan B and it fails to help > ok to use the nebulizer up to every 4 hours but if start needing it regularly call for immediate appointment Plan D = Deltasone - Prednisone 10 mg take  4 each am x 2 days,   2 each am x 2 days,  1 each am x 2 days and stop  I will see if we can resubmit your formoterol(brovana or performist)  and budesonide and yupelri thru [facility] (to take the  place of breztri (three)   = symbicort/spiriva (two plus one)    02/13/2020  f/u ov/Minh Jasper re:  Last week in July 2021  Bilateral R >L mid post cp radiates like a cirlce > ER > lidoderm > Dr Redmond Pulling called hydrocodone which helps the most  Chief Complaint  Patient presents with  . Follow-up    SOB   [facility] [place] eval for positoianl back pain >  Referred to [facility] rec MRI  Dyspnea:  No change sob  Cough: white mucus  Sleeping: can get comfortable  SABA use:  02: 2-3 lpm np  rec No change rx   06/22/2020  f/u ov/Alaiyah Bollman re:  Copd III/ cor pulmonale  and 02 dep still occ smoking on triple neb rx  Chief Complaint  Patient presents with  . Follow-up    Patient has been coughing more green sputum, wearing 4 liters oxygen, feels like breathing is getting worse.   Dyspnea: difficulty getting across the room/ breathing and swelling  Cough: rattling / congested esp in am/ green mucus thick Sleeping: on sofa, upright but can lie flat  SABA use: not using  02: 4plm    No obvious day to day or daytime variability or assoc  mucus plugs or hemoptysis or cp or chest tightness, subjective wheeze or overt sinus or hb symptoms.    . Also denies any obvious fluctuation of symptoms with weather or environmental changes or other aggravating or alleviating factors except as outlined above   No unusual exposure hx or h/o childhood pna/ asthma or knowledge of premature birth.  Current Allergies, Complete Past Medical History, Past Surgical History, Family History, and Social History were  reviewed in [employer] record.  ROS  The following are not active complaints unless bolded Hoarseness, sore throat, dysphagia, dental problems, itching, sneezing,  nasal congestion or discharge of excess mucus or purulent secretions, ear ache,   fever, chills, sweats, unintended wt loss or wt gain, classically pleuritic or exertional cp,  orthopnea pnd or arm/hand swelling  or leg swelling, presyncope, palpitations, abdominal pain, anorexia, nausea, vomiting, diarrhea  or change in bowel habits or change in bladder habits, change in stools or change in urine, dysuria, hematuria,  rash, arthralgias, visual complaints, headache, numbness, weakness or ataxia or problems with walking or coordination,  change in mood or  memory.        Current Meds  Medication Sig  . albuterol (PROVENTIL) (2.5 MG/3ML) 0.083% nebulizer solution Take 3 mLs (2.5 mg total) by nebulization every 6 (six) hours as needed for wheezing or shortness of breath.  Marland Kitchen albuterol (VENTOLIN HFA) 108 (90 Base) MCG/ACT inhaler Inhale 2 puffs into the lungs every 4 (four)  hours as needed for wheezing or shortness of breath.  Marland Kitchen arformoterol (BROVANA) 15 MCG/2ML NEBU Take 2 mLs (15 mcg total) by nebulization 2 (two) times daily.  . Ascorbic Acid (VITAMIN C WITH ROSE HIPS) 500 MG tablet Take 500 mg by mouth daily.  . Budeson-Glycopyrrol-Formoterol (BREZTRI AEROSPHERE) 160-9-4.8 MCG/ACT AERO Inhale 2 puffs into the lungs 2 (two) times daily.  . budesonide (PULMICORT) 0.5 MG/2ML nebulizer solution Take 2 mLs (0.5 mg total) by nebulization in the morning and at bedtime. DX: J44.9  . buPROPion (WELLBUTRIN SR) 150 MG 12 hr tablet Take 150 mg by mouth daily.   . cholecalciferol (VITAMIN D) 1000 UNITS tablet Take 1,000 Units by mouth daily.  . Colchicine 0.6 MG CAPS Take 1 tablet by mouth 2 (two) times daily as needed.  . diltiazem (CARDIZEM CD) 360 MG 24 hr capsule Take 360 mg by mouth daily.   . formoterol (PERFOROMIST) 20  MCG/2ML nebulizer solution Take 2 mLs (20 mcg total) by nebulization 2 (two) times daily. DX: J44.9  . furosemide (LASIX) 20 MG tablet Take 40 mg by mouth daily as needed for fluid.   . Garlic 829 MG TABS Take 1 tablet by mouth daily.  Marland Kitchen HYDROcodone-acetaminophen (NORCO) 7.5-325 MG tablet Take 1 tablet by mouth every 4 (four) hours.  . hydrOXYzine (ATARAX/VISTARIL) 25 MG tablet Take 25 mg by mouth 3 (three) times daily as needed for anxiety.  . lidocaine (LIDODERM) 5 % Place 1 patch onto the skin daily as needed. Apply patch to area most significant pain once per day.  Remove and discard patch within 12 hours of application.  Marland Kitchen losartan (COZAAR) 25 MG tablet Take 1 tablet (25 mg total) by mouth daily.  . Multiple Minerals-Vitamins (CAL MAG ZINC +D3 PO) Take 1 tablet by mouth at bedtime.  . ondansetron (ZOFRAN) 4 MG tablet Take 2-4 mg by mouth every 6 (six) hours as needed for nausea/vomiting.  . OXYGEN Inhale 3 L into the lungs. 24/7 DME- [facility]  . potassium chloride (K-DUR) 10 MEQ tablet Take 10 mEq by mouth daily as needed (swelling). When takes lasix   . predniSONE (DELTASONE) 10 MG tablet Take  4 each am x 2 days,   2 each am x 2 days,  1 each am x 2 days and stop  . Respiratory Therapy Supplies (FLUTTER) DEVI Use as directed  . revefenacin (YUPELRI) 175 MCG/3ML nebulizer solution Take 3 mLs (175 mcg total) by nebulization daily. DX: J44.9  . spironolactone (ALDACTONE) 25 MG tablet Take 1 tablet by mouth 2 (two) times daily.                     Objective:  Physical Exam     06/22/2020       113 02/13/2020     110  11/07/2019     100 01/12/2019     110  09/26/2013  101>102 10/11/2013 > 01/16/2014  99 > 04/06/2014  103 >105 06/30/2014 > 08/11/2014  101 > 11/09/2014  104 > 01/22/2015 106  > 02/06/2015 105 > 03/08/2015 106 > 04/18/2015  106 > 06/15/2015 103 > 09/14/2015   102 > 10/08/2015   103 > 11/05/2015  102 > 02/05/2016  103 > 05/14/2016 102 > 09/24/2016  99 > 12/24/2016  99 > 06/29/2017  104 >  09/28/2017  106 > 11/11/2017 104 > 02/03/2018  107 > 05/06/2018   105 > 06/01/2018  111> 07/14/2018  110 > 08/11/2018   109  Vital signs reviewed  06/22/2020  - Note at rest 02 sats  97% on RA   General appearance:    W/c bound chronically ill wf nad at rest   HEENT : pt wearing mask not removed for exam due to covid -19 concerns.    NECK :  without JVD/Nodes/TM/ nl carotid upstrokes bilaterally   LUNGS: no acc muscle use,  Mod barrel  contour chest wall with bilateral  Distant bs s audible wheeze and  without cough on insp or exp maneuvers and mod  Hyperresonant  to  percussion bilaterally     CV:  RRR  no s3 or murmur or increase in P2, and  2+ pitting both LE's  ABD:  soft and nontender with pos mid insp Hoover's  in the supine position. No bruits or organomegaly appreciated, bowel sounds nl  MS:     ext warm without deformities, calf tenderness, cyanosis or clubbing No obvious joint restrictions   SKIN: severe venous stasis dermaitis/ elephantiasiis changes developing   NEURO:  alert, approp, nl sensorium with  no motor or cerebellar deficits apparent.                                       Assessment & Plan:

## 2020-06-24 ENCOUNTER — Encounter: Payer: Self-pay | Admitting: Internal Medicine

## 2020-06-24 NOTE — Assessment & Plan Note (Signed)
WHO III on 02 and max gerd rx/ defer diuretics to Dr Redmond Pulling          Each maintenance medication was reviewed in detail including emphasizing most importantly the difference between maintenance and prns and under what circumstances the prns are to be triggered using an action plan format where appropriate.  Total time for H and P, chart review, counseling, reviewing devices and generating customized AVS unique to this office visit / same day charting  > 30 min

## 2020-06-24 NOTE — Assessment & Plan Note (Signed)
Exertional desats 06/30/2014 >O2 2lm rx with act  - 11/09/2014  Walked RA  2 laps @ 185 ft each stopped due to  Sat down to 88 % at nl pace but no sob  - ono RA  09/22/15  desat < 89% x 1:26mn > rec continue 02 2lpm - 43/74/8270certification: Patient Saturations on Room Air at Rest = 94% and while Ambulating 2 laps x 185 each= 87% Patient Saturations on 2Liters of oxygen while Ambulating =96% x one additional lap - 11/05/2015  Walked RA x 4 laps @ 185 ft each stopped due to  desat to 87% resolved on 2lpm   - 06/29/2017  Walked RA x 3 laps @ 185 ft each stopped due to  End of study/ slow pace, no desat - 11/11/2017  Walked RA x 3 laps @ 185 ft each stopped due to  End of study,  no  desat  < 90% at moderate to moderately  fast pace   - HCO3  05/20/18  = 34    - 07/14/2018 Room Air at Rest = 96% Patient Saturations on Room Air while Ambulating = 88% Patient Saturations on 3 Liters of oxygen while Ambulating = 95% - ono on 2lpm completed 07/20/18  desat x 132 min so 07/22/2018 rec  repeat ono on 3lpm> done 07/30/18 on 3lpm = 10.6 m at < 89% so ok to continue 3lpm hs  -    As of 06/22/2020  4lpm hs and titrate  daytime  goal of > 90% at all times due to cor pulmonale

## 2020-06-24 NOTE — Assessment & Plan Note (Signed)
Active smoker  pfts 11/2011   FEV1 0.86 (42%) ratio 35  - alpha one AT screening 03/24/12   MM  Level 166  - 09/14/2015  hfa Arlana Pouch 11/05/2015 (when spiriva dpi supply runs out) - flutter valve added 02/05/2016  And recoached 05/14/2016  - PFT's  06/29/2017  FEV1 0.92 (44 % ) ratio 44  p 11 % improvement from saba p symb/spiriva prior to study with DLCO  33/33 % corrects to 35 % for alv volume   - 11/11/2017 referred to rehab >   did not go 05/03/2018 - COPD exac d/t URI, RX zpack and prednisone taper > failed outpt rx and admitted  - 08/11/2018 added pred as plan D   Says mostly has stopped smoking much at this point, but unfortunately she's only stopping it because it's stopping her and and she's very rapidly approaching endstage from my perspective and I understand amenable to palliative care.  I will defer to Dr Redmond Pulling but would also move on to hospice sooner rather than later as medical science has done all it can here.     Group D in terms of symptom/risk and laba/lama/ICS  therefore appropriate rx at this point >>>  Continue triple rx per nebs  For flares of wheeze >  Prednisone rx x 6 day cycles  For nasty mucus > zpak   For thick mucus >> mucinex max dose/ flutter

## 2020-06-27 ENCOUNTER — Telehealth: Payer: Self-pay | Admitting: Internal Medicine

## 2020-06-27 MED ORDER — ALBUTEROL SULFATE (2.5 MG/3ML) 0.083% IN NEBU
2.5000 mg | INHALATION_SOLUTION | Freq: Four times a day (QID) | RESPIRATORY_TRACT | 12 refills | Status: AC | PRN
Start: 2020-06-27 — End: ?

## 2020-06-27 MED ORDER — PREDNISONE 10 MG PO TABS
ORAL_TABLET | ORAL | 0 refills | Status: DC
Start: 2020-06-27 — End: 2020-08-07

## 2020-06-27 NOTE — Telephone Encounter (Signed)
Spoke with patient. She wishes to try the albuterol solution. Will go ahead and sent this into the pharmacy for her as well as the prednisone.   Nothing further needed at time of call.

## 2020-06-27 NOTE — Telephone Encounter (Signed)
Make sure not smoking at all and using rescue rx as per avs   If starts needing more rescue then repeat pred but this time   Prednisone 10 mg #30  Take 4 for three days 3 for three days 2 for three days 1 for three days and stop   Of er if can't get breat at rest p neb and/ or sats < 90% on max flow

## 2020-06-27 NOTE — Telephone Encounter (Signed)
I called and spoke with pt and she is aware of MW recs.  She stated that she just finished the prednisone and abx and she said that she is not understanding why something else is not going to be called into the pharmacy.  She said that her oxygen levels are up but she is having a hard time getting air in.  Any further recs MW?

## 2020-06-27 NOTE — Telephone Encounter (Signed)
Spoke with pt  She was seen 06/22/20- started zpack and pred taper after visit  She has finished these and reports no improvement in her wheezing and cough  She says she is coughing a lot but zero sputum production- she is using her flutter several times per day and mucinex dm 1200 bid Still on yupelri, pulmicort, brovana and uses albuterol inhaler maybe once per day  She has not used her albuterol neb yet  Covid vax done x 2  She states no f/c/s, aches  She is asking if Dr Melvyn Novas has any further recs Here is her last ov avs:   Plan A = Automatic = Always=    Brovana/budesonide twice daily and yupelri once daily per neb     Plan B = Backup (to supplement plan A, not to replace it) Only use your albuterol nebulizer as a rescue medication to be used if you can't catch your breath by resting or doing a relaxed purse lip breathing pattern.  - The less you use it, the better it will work when you need it. - Ok to use the inhaler up to 2 puffs  every 4 hours if you must but call for appointment if use goes up over your usual need - Don't leave home without it !!  (think of it like the spare tire for your car)   Plan C = Crisis - Prednisone 10 mg take  4 each am x 2 days,   2 each am x 2 days,  1 each am x 2 days and stop     For thick mucous > mucinex 1200 mg every 12 hours with the flutter valve as much as possible  For nasty mucus > zpak   Goal is to keep the 02 saturation above 90% at all times    Please schedule a follow up visit in 3 months but call sooner if needed

## 2020-06-27 NOTE — Telephone Encounter (Signed)
We could offer a neb which has the same med as the inhaler but stronger  Albuterol 2.5 mg every 4 hours as needed -  but doubt they can get it this afternoon and same instructions apply as prev re going to ER

## 2020-07-27 ENCOUNTER — Emergency Department (HOSPITAL_COMMUNITY): Payer: Medicare Other

## 2020-07-27 ENCOUNTER — Other Ambulatory Visit: Payer: Self-pay

## 2020-07-27 ENCOUNTER — Inpatient Hospital Stay (HOSPITAL_COMMUNITY): Payer: Medicare Other

## 2020-07-27 ENCOUNTER — Encounter (HOSPITAL_COMMUNITY): Payer: Self-pay

## 2020-07-27 ENCOUNTER — Inpatient Hospital Stay (HOSPITAL_COMMUNITY)
Admission: EM | Admit: 2020-07-27 | Discharge: 2020-08-07 | DRG: 177 | Disposition: A | Discharge status: Skilled Nursing Facility | Payer: Medicare Other | Attending: Internal Medicine | Admitting: Internal Medicine

## 2020-07-27 DIAGNOSIS — Z789 Other specified health status: Secondary | ICD-10-CM | POA: Diagnosis not present

## 2020-07-27 DIAGNOSIS — Z87891 Personal history of nicotine dependence: Secondary | ICD-10-CM

## 2020-07-27 DIAGNOSIS — J9611 Chronic respiratory failure with hypoxia: Secondary | ICD-10-CM | POA: Diagnosis present

## 2020-07-27 DIAGNOSIS — I248 Other forms of acute ischemic heart disease: Secondary | ICD-10-CM | POA: Diagnosis present

## 2020-07-27 DIAGNOSIS — I2723 Pulmonary hypertension due to lung diseases and hypoxia: Secondary | ICD-10-CM | POA: Diagnosis present

## 2020-07-27 DIAGNOSIS — R739 Hyperglycemia, unspecified: Secondary | ICD-10-CM | POA: Diagnosis present

## 2020-07-27 DIAGNOSIS — Z7189 Other specified counseling: Secondary | ICD-10-CM | POA: Diagnosis not present

## 2020-07-27 DIAGNOSIS — R0602 Shortness of breath: Secondary | ICD-10-CM | POA: Diagnosis not present

## 2020-07-27 DIAGNOSIS — R64 Cachexia: Secondary | ICD-10-CM | POA: Diagnosis present

## 2020-07-27 DIAGNOSIS — I480 Paroxysmal atrial fibrillation: Secondary | ICD-10-CM | POA: Diagnosis present

## 2020-07-27 DIAGNOSIS — R52 Pain, unspecified: Secondary | ICD-10-CM | POA: Diagnosis not present

## 2020-07-27 DIAGNOSIS — R7989 Other specified abnormal findings of blood chemistry: Secondary | ICD-10-CM | POA: Diagnosis not present

## 2020-07-27 DIAGNOSIS — R0902 Hypoxemia: Secondary | ICD-10-CM

## 2020-07-27 DIAGNOSIS — J9621 Acute and chronic respiratory failure with hypoxia: Secondary | ICD-10-CM | POA: Diagnosis present

## 2020-07-27 DIAGNOSIS — R5381 Other malaise: Secondary | ICD-10-CM | POA: Diagnosis not present

## 2020-07-27 DIAGNOSIS — J441 Chronic obstructive pulmonary disease with (acute) exacerbation: Secondary | ICD-10-CM | POA: Diagnosis present

## 2020-07-27 DIAGNOSIS — U071 COVID-19: Secondary | ICD-10-CM | POA: Diagnosis present

## 2020-07-27 DIAGNOSIS — F10230 Alcohol dependence with withdrawal, uncomplicated: Secondary | ICD-10-CM | POA: Diagnosis present

## 2020-07-27 DIAGNOSIS — I4891 Unspecified atrial fibrillation: Secondary | ICD-10-CM | POA: Diagnosis present

## 2020-07-27 DIAGNOSIS — Z7952 Long term (current) use of systemic steroids: Secondary | ICD-10-CM

## 2020-07-27 DIAGNOSIS — D638 Anemia in other chronic diseases classified elsewhere: Secondary | ICD-10-CM | POA: Diagnosis present

## 2020-07-27 DIAGNOSIS — J449 Chronic obstructive pulmonary disease, unspecified: Secondary | ICD-10-CM | POA: Diagnosis not present

## 2020-07-27 DIAGNOSIS — R54 Age-related physical debility: Secondary | ICD-10-CM | POA: Diagnosis present

## 2020-07-27 DIAGNOSIS — Z66 Do not resuscitate: Secondary | ICD-10-CM | POA: Diagnosis present

## 2020-07-27 DIAGNOSIS — J1282 Pneumonia due to coronavirus disease 2019: Secondary | ICD-10-CM | POA: Diagnosis present

## 2020-07-27 DIAGNOSIS — Z681 Body mass index (BMI) 19 or less, adult: Secondary | ICD-10-CM

## 2020-07-27 DIAGNOSIS — I872 Venous insufficiency (chronic) (peripheral): Secondary | ICD-10-CM | POA: Diagnosis present

## 2020-07-27 DIAGNOSIS — K760 Fatty (change of) liver, not elsewhere classified: Secondary | ICD-10-CM | POA: Diagnosis present

## 2020-07-27 DIAGNOSIS — L309 Dermatitis, unspecified: Secondary | ICD-10-CM | POA: Diagnosis present

## 2020-07-27 DIAGNOSIS — I2510 Atherosclerotic heart disease of native coronary artery without angina pectoris: Secondary | ICD-10-CM | POA: Diagnosis present

## 2020-07-27 DIAGNOSIS — J440 Chronic obstructive pulmonary disease with acute lower respiratory infection: Secondary | ICD-10-CM | POA: Diagnosis present

## 2020-07-27 DIAGNOSIS — K7010 Alcoholic hepatitis without ascites: Secondary | ICD-10-CM | POA: Diagnosis present

## 2020-07-27 DIAGNOSIS — Z7951 Long term (current) use of inhaled steroids: Secondary | ICD-10-CM

## 2020-07-27 DIAGNOSIS — Z515 Encounter for palliative care: Secondary | ICD-10-CM | POA: Diagnosis not present

## 2020-07-27 DIAGNOSIS — E43 Unspecified severe protein-calorie malnutrition: Secondary | ICD-10-CM | POA: Diagnosis present

## 2020-07-27 DIAGNOSIS — Z79899 Other long term (current) drug therapy: Secondary | ICD-10-CM

## 2020-07-27 DIAGNOSIS — F32A Depression, unspecified: Secondary | ICD-10-CM | POA: Diagnosis present

## 2020-07-27 DIAGNOSIS — I10 Essential (primary) hypertension: Secondary | ICD-10-CM | POA: Diagnosis present

## 2020-07-27 DIAGNOSIS — R627 Adult failure to thrive: Secondary | ICD-10-CM | POA: Diagnosis present

## 2020-07-27 DIAGNOSIS — J9610 Chronic respiratory failure, unspecified whether with hypoxia or hypercapnia: Secondary | ICD-10-CM | POA: Diagnosis present

## 2020-07-27 DIAGNOSIS — L89152 Pressure ulcer of sacral region, stage 2: Secondary | ICD-10-CM | POA: Diagnosis present

## 2020-07-27 DIAGNOSIS — Z9981 Dependence on supplemental oxygen: Secondary | ICD-10-CM

## 2020-07-27 DIAGNOSIS — F10930 Alcohol use, unspecified with withdrawal, uncomplicated: Secondary | ICD-10-CM

## 2020-07-27 DIAGNOSIS — F419 Anxiety disorder, unspecified: Secondary | ICD-10-CM | POA: Diagnosis present

## 2020-07-27 DIAGNOSIS — E785 Hyperlipidemia, unspecified: Secondary | ICD-10-CM | POA: Diagnosis present

## 2020-07-27 LAB — CBC WITH DIFFERENTIAL/PLATELET
Abs Immature Granulocytes: 0.47 10*3/uL — ABNORMAL HIGH (ref 0.00–0.07)
Basophils Absolute: 0.1 10*3/uL (ref 0.0–0.1)
Basophils Relative: 1 %
Eosinophils Absolute: 0 10*3/uL (ref 0.0–0.5)
Eosinophils Relative: 0 %
HCT: 28.1 % — ABNORMAL LOW (ref 36.0–46.0)
Hemoglobin: 8.6 g/dL — ABNORMAL LOW (ref 12.0–15.0)
Immature Granulocytes: 6 %
Lymphocytes Relative: 23 %
Lymphs Abs: 1.9 10*3/uL (ref 0.7–4.0)
MCH: 31.2 pg (ref 26.0–34.0)
MCHC: 30.6 g/dL (ref 30.0–36.0)
MCV: 101.8 fL — ABNORMAL HIGH (ref 80.0–100.0)
Monocytes Absolute: 1.6 10*3/uL — ABNORMAL HIGH (ref 0.1–1.0)
Monocytes Relative: 19 %
Neutro Abs: 4.2 10*3/uL (ref 1.7–7.7)
Neutrophils Relative %: 51 %
Platelets: 119 10*3/uL — ABNORMAL LOW (ref 150–400)
RBC: 2.76 MIL/uL — ABNORMAL LOW (ref 3.87–5.11)
RDW: 18.2 % — ABNORMAL HIGH (ref 11.5–15.5)
WBC: 8.2 10*3/uL (ref 4.0–10.5)
nRBC: 3.3 % — ABNORMAL HIGH (ref 0.0–0.2)

## 2020-07-27 LAB — COMPREHENSIVE METABOLIC PANEL
ALT: 166 U/L — ABNORMAL HIGH (ref 0–44)
AST: 614 U/L — ABNORMAL HIGH (ref 15–41)
Albumin: 2.7 g/dL — ABNORMAL LOW (ref 3.5–5.0)
Alkaline Phosphatase: 166 U/L — ABNORMAL HIGH (ref 38–126)
Anion gap: 22 — ABNORMAL HIGH (ref 5–15)
BUN: 17 mg/dL (ref 8–23)
CO2: 24 mmol/L (ref 22–32)
Calcium: 8.4 mg/dL — ABNORMAL LOW (ref 8.9–10.3)
Chloride: 97 mmol/L — ABNORMAL LOW (ref 98–111)
Creatinine, Ser: 0.74 mg/dL (ref 0.44–1.00)
GFR, Estimated: >60 mL/min (ref >60)
Glucose, Bld: 106 mg/dL — ABNORMAL HIGH (ref 70–99)
Potassium: 4.4 mmol/L (ref 3.5–5.1)
Sodium: 143 mmol/L (ref 135–145)
Total Bilirubin: 1.6 mg/dL — ABNORMAL HIGH (ref 0.3–1.2)
Total Protein: 5.4 g/dL — ABNORMAL LOW (ref 6.5–8.1)

## 2020-07-27 LAB — BRAIN NATRIURETIC PEPTIDE: B Natriuretic Peptide: 104.4 pg/mL — ABNORMAL HIGH (ref 0.0–100.0)

## 2020-07-27 LAB — RESP PANEL BY RT-PCR (FLU A&B, COVID) ARPGX2
Influenza A by PCR: NEGATIVE
Influenza B by PCR: NEGATIVE
SARS Coronavirus 2 by RT PCR: POSITIVE — AB

## 2020-07-27 LAB — TROPONIN I (HIGH SENSITIVITY)
Troponin I (High Sensitivity): 37 ng/L — ABNORMAL HIGH (ref <18)
Troponin I (High Sensitivity): 85 ng/L — ABNORMAL HIGH (ref <18)

## 2020-07-27 LAB — HEPATITIS PANEL, ACUTE
HCV Ab: NONREACTIVE
Hep A IgM: NONREACTIVE
Hep B C IgM: NONREACTIVE
Hepatitis B Surface Ag: NONREACTIVE

## 2020-07-27 LAB — TSH: TSH: 0.887 u[IU]/mL (ref 0.350–4.500)

## 2020-07-27 LAB — PROTIME-INR
INR: 1 (ref 0.8–1.2)
Prothrombin Time: 12.8 seconds (ref 11.4–15.2)

## 2020-07-27 LAB — LACTIC ACID, PLASMA
Lactic Acid, Venous: 6.4 mmol/L (ref 0.5–1.9)
Lactic Acid, Venous: 4 mmol/L (ref 0.5–1.9)

## 2020-07-27 MED ORDER — FOLIC ACID 1 MG PO TABS
1.0000 mg | ORAL_TABLET | Freq: Every day | ORAL | Status: DC
  Administered 2020-07-28 – 2020-08-07 (×11): 1 mg via ORAL
  Filled 2020-07-27 (×11): qty 1

## 2020-07-27 MED ORDER — THIAMINE HCL 100 MG/ML IJ SOLN
100.0000 mg | Freq: Every day | INTRAMUSCULAR | Status: DC
  Filled 2020-07-27 (×5): qty 2

## 2020-07-27 MED ORDER — FUROSEMIDE 10 MG/ML IJ SOLN
40.0000 mg | Freq: Once | INTRAMUSCULAR | Status: AC
  Administered 2020-07-27: 40 mg via INTRAVENOUS
  Filled 2020-07-27: qty 4

## 2020-07-27 MED ORDER — SODIUM CHLORIDE 0.9 % IV BOLUS
500.0000 mL | Freq: Once | INTRAVENOUS | Status: AC
  Administered 2020-07-27: 500 mL via INTRAVENOUS

## 2020-07-27 MED ORDER — HEPARIN (PORCINE) 25000 UT/250ML-% IV SOLN
900.0000 [IU]/h | INTRAVENOUS | Status: DC
  Administered 2020-07-27: 22:00:00 700 [IU]/h via INTRAVENOUS
  Filled 2020-07-27: qty 250

## 2020-07-27 MED ORDER — HEPARIN BOLUS VIA INFUSION
2500.0000 [IU] | Freq: Once | INTRAVENOUS | Status: AC
  Administered 2020-07-27: 2500 [IU] via INTRAVENOUS
  Filled 2020-07-27: qty 2500

## 2020-07-27 MED ORDER — DILTIAZEM HCL-DEXTROSE 125-5 MG/125ML-% IV SOLN (PREMIX)
5.0000 mg/h | INTRAVENOUS | Status: DC
  Administered 2020-07-27: 5 mg/h via INTRAVENOUS
  Administered 2020-07-28: 15 mg/h via INTRAVENOUS
  Filled 2020-07-27 (×2): qty 125

## 2020-07-27 MED ORDER — DEXAMETHASONE 4 MG PO TABS
4.0000 mg | ORAL_TABLET | Freq: Every day | ORAL | Status: DC
  Filled 2020-07-27: qty 1

## 2020-07-27 MED ORDER — ONDANSETRON HCL 4 MG/2ML IJ SOLN: 4.0000 mg | Freq: Four times a day (QID) | INTRAMUSCULAR | Status: DC | PRN

## 2020-07-27 MED ORDER — LORAZEPAM 2 MG/ML IJ SOLN
2.0000 mg | Freq: Once | INTRAMUSCULAR | Status: AC
  Administered 2020-07-27: 2 mg via INTRAVENOUS
  Filled 2020-07-27: qty 1

## 2020-07-27 MED ORDER — LORAZEPAM 2 MG/ML IJ SOLN: 1.0000 mg | INTRAMUSCULAR | Status: AC | PRN

## 2020-07-27 MED ORDER — SODIUM CHLORIDE 0.9 % IV BOLUS: 500.0000 mL | Freq: Once | INTRAVENOUS | Status: DC

## 2020-07-27 MED ORDER — LORAZEPAM 1 MG PO TABS
1.0000 mg | ORAL_TABLET | ORAL | Status: AC | PRN
  Administered 2020-07-28 – 2020-07-30 (×8): 1 mg via ORAL
  Filled 2020-07-27 (×9): qty 1

## 2020-07-27 MED ORDER — THIAMINE HCL 100 MG PO TABS
100.0000 mg | ORAL_TABLET | Freq: Every day | ORAL | Status: DC
  Administered 2020-07-28 – 2020-08-07 (×12): 100 mg via ORAL
  Filled 2020-07-27 (×12): qty 1

## 2020-07-27 MED ORDER — ACETAMINOPHEN 325 MG PO TABS
650.0000 mg | ORAL_TABLET | ORAL | Status: DC | PRN
  Administered 2020-07-28 – 2020-08-03 (×6): 650 mg via ORAL
  Filled 2020-07-27 (×7): qty 2

## 2020-07-27 MED ORDER — ALBUTEROL SULFATE HFA 108 (90 BASE) MCG/ACT IN AERS
1.0000 | INHALATION_SPRAY | Freq: Once | RESPIRATORY_TRACT | Status: AC
  Administered 2020-07-27: 2 via RESPIRATORY_TRACT
  Filled 2020-07-27: qty 6.7

## 2020-07-27 MED ORDER — LORAZEPAM 2 MG/ML IJ SOLN
1.0000 mg | Freq: Once | INTRAMUSCULAR | Status: AC
  Administered 2020-07-27: 1 mg via INTRAVENOUS
  Filled 2020-07-27: qty 1

## 2020-07-27 MED ORDER — ADULT MULTIVITAMIN W/MINERALS CH
1.0000 | ORAL_TABLET | Freq: Every day | ORAL | Status: DC
  Administered 2020-07-28 – 2020-08-07 (×11): 1 via ORAL
  Filled 2020-07-27 (×11): qty 1

## 2020-07-27 MED ORDER — DILTIAZEM HCL 25 MG/5ML IV SOLN
5.0000 mg | Freq: Once | INTRAVENOUS | Status: DC
  Filled 2020-07-27: qty 5

## 2020-07-27 MED ORDER — DILTIAZEM HCL 25 MG/5ML IV SOLN
10.0000 mg | Freq: Once | INTRAVENOUS | Status: AC
  Administered 2020-07-27: 10 mg via INTRAVENOUS

## 2020-07-27 MED ORDER — IOHEXOL 300 MG/ML  SOLN
80.0000 mL | Freq: Once | INTRAMUSCULAR | Status: AC | PRN
  Administered 2020-07-27: 80 mL via INTRAVENOUS

## 2020-07-27 NOTE — Progress Notes (Signed)
ANTICOAGULATION CONSULT NOTE - Initial Consult  Pharmacy Consult for Heparin Indication: atrial fibrillation  Allergies  Allergen Reactions  . Tramadol Other (See Comments)    GI UPSET    Patient Measurements: Height: _0  (157.5 cm) Weight: 49 kg (108 lb) IBW/kg (Calculated) : 50.1 Heparin Dosing Weight: 49 kg   Vital Signs: Temp: 98 F (36.7 C) (02/11 1451) Temp Source: Oral (02/11 1451) BP: 141/76 (02/11 1900) Pulse Rate: 123 (02/11 1900)  Labs: Recent Labs    07/27/20 1515  HGB 8.6*  HCT 28.1*  PLT 119*  CREATININE 0.74  TROPONINIHS 37*    Estimated Creatinine Clearance: 47.7 mL/min (by C-G formula based on SCr of 0.74 mg/dL).   Medical History: Past Medical History:  Diagnosis Date  . Abrasion    under left knee x 2 places changing dressing q day of bid, applying ssd cream area healing due to fell 2 weeks ago  . Blood dyscrasia    BLEEDS EASY  . Bruises easily    bleeds easily  . Closed patellar sleeve fracture of left knee 01/04/2018  . Closed patellar sleeve fracture of right knee 01/04/2018   healing   . COPD (chronic obstructive pulmonary disease) ([place])   . Coronary artery disease    per dr Philis Kendall note 01-01-18 note  . Hernia, inguinal    LEFT  . Hypertension    states under control with meds., has been on med. > 10 yr.  . On home oxygen therapy    at night 2L/min  . Pneumonia    "SEVERAL TIMES IN PAST, LAST TIME 2018"  . Pulmonary hypertension ([place])    per dr Terrence Dupont 7-19 19 lov note  . Shortness of breath dyspnea    with exertion  . SVD (spontaneous vaginal delivery)    x 1  . Thin skin   . Urinary tract infection 2019  . Wears partial dentures    lower partial and upper plate    Assessment: 75 yo F presents with afib with RVR. No AC noted PTA. Pharmacy asked to start IV heparin. Hgb low 8.6 (last Hgb 13.1 on 01/15/20), plts low 119. No overt bleeding noted.  Goal of Therapy:  Heparin level 0.3-0.7 units/ml Monitor platelets  by anticoagulation protocol: Yes   Plan:   Give IV heparin 2500 unit IV bolus Start heparin infusion at 700 units/hr Check 6-hr HL Monitor daily HL, CBC, s/sx bleeding  Richardine Service, PharmD, BCPS PGY2 Cardiology Pharmacy Resident Phone: [phone] 07/27/2020  7:55 PM  Please check [email or web address] for unit-specific pharmacy phone numbers.

## 2020-07-27 NOTE — ED Notes (Signed)
Date and time results received: 07/27/20   Test: Covid Critical Value: Positive  Name of Provider Notified: [place]

## 2020-07-27 NOTE — H&P (Signed)
History and Physical   [name] WLN:[id number] DOB: 04/14/1946 DOA: 07/27/2020  Referring MD/NP/PA: Louanna Raw, PA  PCP: Christain Sacramento, MD   Patient coming from: Home  Chief Complaint: Shortness of breath and chest pain  HPI: [name] is a 75 y.o. female with medical history significant of advanced COPD on 3 L of oxygen at home, A. fib, failure to thrive, severe protein calorie malnutrition, pulmonary hypertension, coronary artery disease, who was brought in by EMS secondary to shortness of breath and weakness.  Patient reported worsening symptoms at home.  She denied any triggers.  She dialed her oxygen up to 4 L but still very short of breath.  She was having palpitations as well.  Her cough has been worsening with productive phlegm over the last 2 to 3 days.  Patient was recently treated for pneumonia.  She was fully vaccinated against the COVID-19 virus.  On arrival in the ER she was found to be in atrial fibrillation with rapid ventricular response.  She additionally has tested now positive for COVID-19.  No obvious pulmonary infiltrates consistent with Covid.  Patient is tremulous and apparently has history of alcohol abuse and now going into withdrawals.  She is being admitted with A. fib with RVR as well as alcohol withdrawals..  ED Course: Temperature 99 blood pressure 160/102 pulse was 79 respirate of 30 and oxygen sat 94% on 4 L.  Chemistry showed glucose of 106 gap of 22 alkaline phosphatase 166 albumin 2.7 AST 614 ALT 166 total protein 5.4.  Initial troponin XI then 37 than 85.  Hemoglobin 8.6 and platelet count of 119.  COVID-19 is positive.  Acute hepatitis panel is negative.  TSH 0.887.  Chest x-ray showed right predominant basilar opacities with atelectasis versus infiltrates.  CT abdomen pelvis showed diffuse and severely fatty infiltration of the liver.  Otherwise no significant new findings.  Patient initiated on Cardizem drip and being admitted to the hospital  for further evaluation and treatment.  Review of Systems: As per HPI otherwise 10 point review of systems negative.    Past Medical History:  Diagnosis Date  . Abrasion    under left knee x 2 places changing dressing q day of bid, applying ssd cream area healing due to fell 2 weeks ago  . Blood dyscrasia    BLEEDS EASY  . Bruises easily    bleeds easily  . Closed patellar sleeve fracture of left knee 01/04/2018  . Closed patellar sleeve fracture of right knee 01/04/2018   healing   . COPD (chronic obstructive pulmonary disease) ([place])   . Coronary artery disease    per dr Philis Kendall note 01-01-18 note  . Hernia, inguinal    LEFT  . Hypertension    states under control with meds., has been on med. > 10 yr.  . On home oxygen therapy    at night 2L/min  . Pneumonia    "SEVERAL TIMES IN PAST, LAST TIME 2018"  . Pulmonary hypertension ([place])    per dr Terrence Dupont 7-19 19 lov note  . Shortness of breath dyspnea    with exertion  . SVD (spontaneous vaginal delivery)    x 1  . Thin skin   . Urinary tract infection 2019  . Wears partial dentures    lower partial and upper plate    Past Surgical History:  Procedure Laterality Date  . COLONOSCOPY     Less than 10 years ago per patient   .  CYSTOCELE REPAIR N/A 03/02/2018   Procedure: ANTERIOR REPAIR (CYSTOCELE);  Surgeon: Cheri Fowler, MD;  Location: [facility];  Service: Gynecology;  Laterality: N/A;  OUTPATIENT IN BED  . MULTIPLE TOOTH EXTRACTIONS    . RESECTION DISTAL CLAVICAL Left 10/12/2014   Procedure: DISTAL CLAVICLE EXCISION;  Surgeon: Kathryne Hitch, MD;  Location: [facility];  Service: Orthopedics;  Laterality: Left;  . SHOULDER ARTHROSCOPY WITH ROTATOR CUFF REPAIR AND SUBACROMIAL DECOMPRESSION Left 10/12/2014   Procedure: LEFT SHOULDER ARTHROSCOPY, DEBRIDEMENT WITH ACROMIOPLASTY,  ROTATOR CUFF REPAIR AND RELEASE BICEPS TENDON;  Surgeon: Kathryne Hitch, MD;  Location: [facility];  Service: Orthopedics;   Laterality: Left;     reports that she quit smoking about 3 years ago. Her smoking use included cigarettes. She has a 57.00 pack-year smoking history. She has never used smokeless tobacco. She reports current alcohol use of about 28.0 standard drinks of alcohol per week. She reports that she does not use drugs.  Allergies  Allergen Reactions  . Tramadol Other (See Comments)    GI UPSET    Family History  Problem Relation Age of Onset  . Heart disease Father   . Kidney failure Father   . Colon cancer Neg Hx   . Esophageal cancer Neg Hx      Prior to Admission medications   Medication Sig Start Date End Date Taking? Authorizing Provider  albuterol (PROVENTIL) (2.5 MG/3ML) 0.083% nebulizer solution Take 3 mLs (2.5 mg total) by nebulization every 6 (six) hours as needed for wheezing or shortness of breath. 10/08/15   Tanda Rockers, MD  albuterol (PROVENTIL) (2.5 MG/3ML) 0.083% nebulizer solution Take 3 mLs (2.5 mg total) by nebulization every 6 (six) hours as needed for wheezing or shortness of breath. 06/27/20   Tanda Rockers, MD  albuterol (VENTOLIN HFA) 108 (90 Base) MCG/ACT inhaler Inhale 2 puffs into the lungs every 4 (four) hours as needed for wheezing or shortness of breath. 05/07/20   Tanda Rockers, MD  arformoterol (BROVANA) 15 MCG/2ML NEBU Take 2 mLs (15 mcg total) by nebulization 2 (two) times daily. 10/31/19   Martyn Ehrich, NP  Ascorbic Acid (VITAMIN C WITH ROSE HIPS) 500 MG tablet Take 500 mg by mouth daily.    [provider]  azithromycin (ZITHROMAX) 250 MG tablet Take 2 on day one then 1 daily x 4 days 06/22/20   Tanda Rockers, MD  budesonide (PULMICORT) 0.5 MG/2ML nebulizer solution Take 2 mLs (0.5 mg total) by nebulization in the morning and at bedtime. DX: J44.9 11/28/19   Tanda Rockers, MD  buPROPion (WELLBUTRIN SR) 150 MG 12 hr tablet Take 150 mg by mouth daily.  05/26/17   [provider]  cholecalciferol (VITAMIN D) 1000 UNITS tablet Take  1,000 Units by mouth daily.    [provider]  diltiazem (CARDIZEM CD) 360 MG 24 hr capsule Take 360 mg by mouth daily.  12/05/17   [provider]  formoterol (PERFOROMIST) 20 MCG/2ML nebulizer solution Take 2 mLs (20 mcg total) by nebulization 2 (two) times daily. DX: J44.9 11/28/19   Tanda Rockers, MD  furosemide (LASIX) 20 MG tablet Take 40 mg by mouth daily as needed for fluid.     [provider]  Garlic 161 MG TABS Take 1 tablet by mouth daily.    [provider]  HYDROcodone-acetaminophen (NORCO) 7.5-325 MG tablet Take 1 tablet by mouth every 4 (four) hours. 08/03/19   [provider]  hydrOXYzine (  ATARAX/VISTARIL) 25 MG tablet Take 25 mg by mouth 3 (three) times daily as needed for anxiety. 08/03/19   [provider]  lidocaine (LIDODERM) 5 % Place 1 patch onto the skin daily as needed. Apply patch to area most significant pain once per day.  Remove and discard patch within 12 hours of application. 01/16/20   Petrucelli, Samantha R, PA-C  losartan (COZAAR) 25 MG tablet Take 1 tablet (25 mg total) by mouth daily. 08/24/19   Harold Hedge, MD  Multiple Minerals-Vitamins (CAL MAG ZINC +D3 PO) Take 1 tablet by mouth at bedtime.    [provider]  ondansetron (ZOFRAN) 4 MG tablet Take 2-4 mg by mouth every 6 (six) hours as needed for nausea/vomiting. 07/21/19   [provider]  OXYGEN Inhale 3 L into the lungs. 24/7 DME- [employer]    [provider]  potassium chloride (K-DUR) 10 MEQ tablet Take 10 mEq by mouth daily as needed (swelling). When takes lasix  10/13/17   [provider]  predniSONE (DELTASONE) 10 MG tablet Take  4 each am x 2 days,   2 each am x 2 days,  1 each am x 2 days and stop 06/22/20   Tanda Rockers, MD  predniSONE (DELTASONE) 10 MG tablet Take 4 tabs by mouth for 3 days, then 3 for 3 days, 2 for 3 days, 1 for 3 days and stop 06/27/20   Tanda Rockers, MD  Respiratory Therapy Supplies (FLUTTER)  DEVI Use as directed 02/05/16   Tanda Rockers, MD  revefenacin (YUPELRI) 175 MCG/3ML nebulizer solution Take 3 mLs (175 mcg total) by nebulization daily. DX: J44.9 11/28/19   Tanda Rockers, MD  spironolactone (ALDACTONE) 25 MG tablet Take 1 tablet by mouth 2 (two) times daily. 06/03/18   [provider]    Physical Exam: Vitals:   07/27/20 1530 07/27/20 1630 07/27/20 1730 07/27/20 1900  BP: 137/81 123/71 137/80 (!) 141/76  Pulse: (!) 167 (!) 134 (!) 132 (!) 123  Resp: 17 (!) 22 20 (!) 24  Temp:      TempSrc:      SpO2: 97% 98% 100% 100%  Weight:      Height:          Constitutional: Chronically ill looking, cachectic, in mild distress Vitals:   07/27/20 1530 07/27/20 1630 07/27/20 1730 07/27/20 1900  BP: 137/81 123/71 137/80 (!) 141/76  Pulse: (!) 167 (!) 134 (!) 132 (!) 123  Resp: 17 (!) 22 20 (!) 24  Temp:      TempSrc:      SpO2: 97% 98% 100% 100%  Weight:      Height:       Eyes: PERRL, lids and conjunctivae normal ENMT: Mucous membranes are moist. Posterior pharynx clear of any exudate or lesions.Normal dentition.  Neck: normal, supple, no masses, no thyromegaly Respiratory: Coarse breath sound bilaterally with significant wheeze and some crackles, . Normal respiratory effort. No accessory muscle use.  Cardiovascular: Irregularly irregular with tachycardia, no murmurs / rubs / gallops. No extremity edema. 2+ pedal pulses. No carotid bruits.  Abdomen: no tenderness, no masses palpated. No hepatosplenomegaly. Bowel sounds positive.  Musculoskeletal: no clubbing / cyanosis. No joint deformity upper and lower extremities. Good ROM, no contractures. Normal muscle tone.  Skin: no rashes, lesions, ulcers. No induration Neurologic: CN 2-12 grossly intact. Sensation intact, DTR normal. Strength 5/5 in all 4.  Psychiatric: Normal judgment and insight. Alert and oriented x 3. Normal mood.  Labs on Admission: I have personally reviewed following labs and imaging  studies  CBC: Recent Labs  Lab 07/27/20 1515  WBC 8.2  NEUTROABS 4.2  HGB 8.6*  HCT 28.1*  MCV 101.8*  PLT 099*   Basic Metabolic Panel: Recent Labs  Lab 07/27/20 1515  NA 143  K 4.4  CL 97*  CO2 24  GLUCOSE 106*  BUN 17  CREATININE 0.74  CALCIUM 8.4*   GFR: Estimated Creatinine Clearance: 47.7 mL/min (by C-G formula based on SCr of 0.74 mg/dL). Liver Function Tests: Recent Labs  Lab 07/27/20 1515  AST 614*  ALT 166*  ALKPHOS 166*  BILITOT 1.6*  PROT 5.4*  ALBUMIN 2.7*   No results for input(s): LIPASE, AMYLASE in the last 168 hours. No results for input(s): AMMONIA in the last 168 hours. Coagulation Profile: No results for input(s): INR, PROTIME in the last 168 hours. Cardiac Enzymes: No results for input(s): CKTOTAL, CKMB, CKMBINDEX, TROPONINI in the last 168 hours. BNP (last 3 results) No results for input(s): PROBNP in the last 8760 hours. HbA1C: No results for input(s): HGBA1C in the last 72 hours. CBG: No results for input(s): GLUCAP in the last 168 hours. Lipid Profile: No results for input(s): CHOL, HDL, LDLCALC, TRIG, CHOLHDL, LDLDIRECT in the last 72 hours. Thyroid Function Tests: No results for input(s): TSH, T4TOTAL, FREET4, T3FREE, THYROIDAB in the last 72 hours. Anemia Panel: No results for input(s): VITAMINB12, FOLATE, FERRITIN, TIBC, IRON, RETICCTPCT in the last 72 hours. Urine analysis:    Component Value Date/Time   COLORURINE YELLOW 08/18/2019 2150   APPEARANCEUR CLOUDY (A) 08/18/2019 2150   LABSPEC 1.018 08/18/2019 2150   PHURINE 5.0 08/18/2019 2150   GLUCOSEU NEGATIVE 08/18/2019 2150   HGBUR NEGATIVE 08/18/2019 2150   BILIRUBINUR NEGATIVE 08/18/2019 2150   [place] NEGATIVE 08/18/2019 2150   PROTEINUR NEGATIVE 08/18/2019 2150   NITRITE NEGATIVE 08/18/2019 2150   LEUKOCYTESUR MODERATE (A) 08/18/2019 2150   Sepsis Labs: _0 (procalcitonin:4,lacticidven:4) ) Recent Results (from the past 240 hour(s))  Resp Panel by  RT-PCR (Flu A&B, Covid) Nasopharyngeal Swab     Status: Abnormal   Collection Time: 07/27/20  3:28 PM   Specimen: Nasopharyngeal Swab; Nasopharyngeal(NP) swabs in vial transport medium  Result Value Ref Range Status   SARS Coronavirus 2 by RT PCR POSITIVE (A) NEGATIVE Final    Comment: RESULT CALLED TO, READ BACK BY AND VERIFIED WITH: Wyvonna Plum RN [id number] 07/27/20 A BROWNING (NOTE) SARS-CoV-2 target nucleic acids are DETECTED.  The SARS-CoV-2 RNA is generally detectable in upper respiratory specimens during the acute phase of infection. Positive results are indicative of the presence of the identified virus, but do not rule out bacterial infection or co-infection with other pathogens not detected by the test. Clinical correlation with patient history and other diagnostic information is necessary to determine patient infection status. The expected result is Negative.  Fact Sheet for Patients: EntrepreneurPulse.com.au  Fact Sheet for Healthcare Providers: IncredibleEmployment.be  This test is not yet approved or cleared by the [place] FDA and  has been authorized for detection and/or diagnosis of SARS-CoV-2 by FDA under an Emergency Use Authorization (EUA).  This EUA will remain in effect (meaning this test can  be used) for the duration of  the COVID-19 declaration under Section 564(b)(1) of the Act, 21 U.S.C. section 360bbb-3(b)(1), unless the authorization is terminated or revoked sooner.     Influenza A by PCR NEGATIVE NEGATIVE Final   Influenza B by PCR NEGATIVE NEGATIVE Final  Comment: (NOTE) The Xpert Xpress SARS-CoV-2/FLU/RSV plus assay is intended as an aid in the diagnosis of influenza from Nasopharyngeal swab specimens and should not be used as a sole basis for treatment. Nasal washings and aspirates are unacceptable for Xpert Xpress SARS-CoV-2/FLU/RSV testing.  Fact Sheet for  Patients: EntrepreneurPulse.com.au  Fact Sheet for Healthcare Providers: IncredibleEmployment.be  This test is not yet approved or cleared by the [place] FDA and has been authorized for detection and/or diagnosis of SARS-CoV-2 by FDA under an Emergency Use Authorization (EUA). This EUA will remain in effect (meaning this test can be used) for the duration of the COVID-19 declaration under Section 564(b)(1) of the Act, 21 U.S.C. section 360bbb-3(b)(1), unless the authorization is terminated or revoked.  Performed at [facility], [place] [address] [place], [place] [address]      Radiological Exams on Admission: DG Chest Portable 1 View  Result Date: 07/27/2020 CLINICAL DATA:  SOB EXAM: PORTABLE CHEST 1 VIEW COMPARISON:  01/16/2020 and prior. FINDINGS: Emphysematous changes. Patchy right predominant basilar opacities. No pneumothorax or pleural effusion. Cardiomediastinal silhouette within normal limits. IMPRESSION: Right predominant basilar opacities, atelectasis versus infiltrate. Emphysema. Electronically Signed   By: Primitivo Gauze M.D.   On: 07/27/2020 15:25    EKG: Independently reviewed.  Shows atrial fibrillation with rate in the 160s to 170s  Assessment/Plan Principal Problem:   New onset a-fib [facility]) Active Problems:   Essential hypertension   Chronic respiratory failure with hypoxia (HCC)   COPD with acute exacerbation (HCC)   Protein-calorie malnutrition, severe   COVID-19 virus infection     #1 atrial fibrillation with rapid ventricular response: Patient will be admitted to progressive care.  Initiated Cardizem drip.  Patient symptoms are complicated with the fact that she is having alcohol withdrawals.  We will continue to titrate.  May add beta-blockers or amiodarone as needed.  Cardiology already consulted over the phone.  Initiate heparin drip and transition to oral anticoagulants when stable  #2 acute on chronic  respiratory failure with hypoxia: Most likely secondary to COPD exacerbation as a result of COVID-19 viral infection.  Breathing treatment with steroids started and monitoring continues.  #3 COVID-19 infection: Patient is fully vaccinated.  Does not have.  To have overwhelming lung infections.  Will initiate dexamethasone.  We will hold off on other medicines including remdesivir for now.  #4 severe protein calorie malnutrition: Increase dietary intake.  #5 alcohol withdrawals: Initiate CIWA protocol.  If symptoms continue to get worse may consider Precedex and transferred to the ICU.  #6 essential hypertension: Continue with home regimen in addition to medication as above.   DVT prophylaxis: Heparin drip Code Status: Full code Family Communication: No family at bedside Disposition Plan: To be determined Consults called: Dr. Terrence Dupont consulted by ER Admission status: Inpatient  Severity of Illness: The appropriate patient status for this patient is INPATIENT. Inpatient status is judged to be reasonable and necessary in order to provide the required intensity of service to ensure the patient's safety. The patient's presenting symptoms, physical exam findings, and initial radiographic and laboratory data in the context of their chronic comorbidities is felt to place them at high risk for further clinical deterioration. Furthermore, it is not anticipated that the patient will be medically stable for discharge from the hospital within 2 midnights of admission. The following factors support the patient status of inpatient.   " The patient's presenting symptoms include shortness of breath and palpitations. " The worrisome physical exam findings include  chronically ill looking in mild distress. " The initial radiographic and laboratory data are worrisome because of COVID-19 positivity. " The chronic co-morbidities include A. fib with COPD.   * I certify that at the point of admission it is my  clinical judgment that the patient will require inpatient hospital care spanning beyond 2 midnights from the point of admission due to high intensity of service, high risk for further deterioration and high frequency of surveillance required.Barbette Merino MD [facility] Pager [phone]  If 7PM-7AM, please contact night-coverage [email or web address] Password [facility]  07/27/2020, 7:21 PM

## 2020-07-27 NOTE — ED Provider Notes (Signed)
[facility] EMERGENCY DEPARTMENT Provider Note   CSN: [id number] Arrival date & time: 07/27/20  1439     History Chief Complaint  Patient presents with  . Shortness of Breath  . Chest Pain    Started two weeks ago with progression to severe difficulty breathing and "bad cp"    [name] is a 75 y.o. female brought in by EMS for evaluation of shortness of breath, A. fib with RVR.  Patient called EMS today because she was having trouble breathing.  She reports that for the last week or so, she has not felt good.  She states she has had chest pain that is midsternal.  She is also had worsening cough that is productive of phlegm.  She reports a history of COPD and uses 3 L of oxygen.  She has had to increase it to 4 L.  She called EMS today when she was having worsening trouble breathing.  She reports she was recently treated for pneumonia.  She has gotten Covid vaccine x2.  Patient reports that for the last week, she has not taken any of her medications.  She reports a history of A. fib and is on diltiazem.  She has also had some worsening swelling in her legs.  She denies any abdominal pain, nausea/vomiting.  EM LEVEL 5 CAVEAT DUE TO ACUITY OF CONDITION   The history is provided by the patient and the EMS personnel.       Past Medical History:  Diagnosis Date  . Abrasion    under left knee x 2 places changing dressing q day of bid, applying ssd cream area healing due to fell 2 weeks ago  . Blood dyscrasia    BLEEDS EASY  . Bruises easily    bleeds easily  . Closed patellar sleeve fracture of left knee 01/04/2018  . Closed patellar sleeve fracture of right knee 01/04/2018   healing   . COPD (chronic obstructive pulmonary disease) ([place])   . Coronary artery disease    per dr Philis Kendall note 01-01-18 note  . Hernia, inguinal    LEFT  . Hypertension    states under control with meds., has been on med. > 10 yr.  . On home oxygen therapy    at night 2L/min   . Pneumonia    "SEVERAL TIMES IN PAST, LAST TIME 2018"  . Pulmonary hypertension ([place])    per dr Terrence Dupont 7-19 19 lov note  . Shortness of breath dyspnea    with exertion  . SVD (spontaneous vaginal delivery)    x 1  . Thin skin   . Urinary tract infection 2019  . Wears partial dentures    lower partial and upper plate    Patient Active Problem List   Diagnosis Date Noted  . Chest pain, musculoskeletal 02/14/2020  . Need for vaccination 10/05/2019  . Rheumatoid factor positive 09/19/2019  . 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 Protein-calorie malnutrition, severe 08/20/2019  . Severe sepsis ([place]) 08/19/2019  . Acute lower UTI 08/19/2019  . Pressure injury of skin 08/19/2019  . ARF (acute renal failure) ([place]) 08/18/2019  . Hyperkalemia 08/18/2019  . Transaminitis 08/18/2019  . Generalized weakness 08/18/2019  . Leukocytosis 08/18/2019  . Anemia 08/18/2019  . Acute renal failure (ARF) ([place]) 08/18/2019  . Chronic respiratory failure with hypoxia and hypercapnia ([place]) 06/02/2018  . COPD with acute exacerbation ([place]) 05/09/2018  . Hyponatremia 05/09/2018  . Acute urinary retention 05/09/2018  . Cystocele with prolapse  03/02/2018  . Cor pulmonale, chronic ([place]) 06/29/2017  . Chronic respiratory failure with hypoxia ([place]) 06/30/2014  . Pulmonary infiltrates 01/26/2013  . Multiple pulmonary nodules determined by computed tomography of lung 08/19/2012  . Cancer screening 05/12/2012  . COPD exacerbation ([place]) 05/12/2012  . Essential hypertension 03/28/2012  . COPD GOLD III 12/02/2011  . Chronic cough 12/02/2011  . Cigarette smoker 12/02/2011    Past Surgical History:  Procedure Laterality Date  . COLONOSCOPY     Less than 10 years ago per patient   . CYSTOCELE REPAIR N/A 03/02/2018   Procedure: ANTERIOR REPAIR (CYSTOCELE);  Surgeon: Cheri Fowler, MD;  Location: [facility];  Service: Gynecology;  Laterality: N/A;  OUTPATIENT IN BED  . MULTIPLE TOOTH EXTRACTIONS    .  RESECTION DISTAL CLAVICAL Left 10/12/2014   Procedure: DISTAL CLAVICLE EXCISION;  Surgeon: Kathryne Hitch, MD;  Location: [facility];  Service: Orthopedics;  Laterality: Left;  . SHOULDER ARTHROSCOPY WITH ROTATOR CUFF REPAIR AND SUBACROMIAL DECOMPRESSION Left 10/12/2014   Procedure: LEFT SHOULDER ARTHROSCOPY, DEBRIDEMENT WITH ACROMIOPLASTY,  ROTATOR CUFF REPAIR AND RELEASE BICEPS TENDON;  Surgeon: Kathryne Hitch, MD;  Location: [facility];  Service: Orthopedics;  Laterality: Left;     OB History   No obstetric history on file.     Family History  Problem Relation Age of Onset  . Heart disease Father   . Kidney failure Father   . Colon cancer Neg Hx   . Esophageal cancer Neg Hx     Social History   Tobacco Use  . Smoking status: Former Smoker    Packs/day: 1.00    Years: 57.00    Pack years: 57.00    Types: Cigarettes    Quit date: 03/20/2017    Years since quitting: 3.3  . Smokeless tobacco: Never Used  . Tobacco comment: <1ppd 02/05/2016  Vaping Use  . Vaping Use: Never used  Substance Use Topics  . Alcohol use: Yes    Alcohol/week: 28.0 standard drinks    Types: 14 Cans of beer, 14 Shots of liquor per week    Comment: several beers/day and "a shot of whiskey"  . Drug use: No    Home Medications Prior to Admission medications   Medication Sig Start Date End Date Taking? Authorizing Provider  albuterol (PROVENTIL) (2.5 MG/3ML) 0.083% nebulizer solution Take 3 mLs (2.5 mg total) by nebulization every 6 (six) hours as needed for wheezing or shortness of breath. 10/08/15   Tanda Rockers, MD  albuterol (PROVENTIL) (2.5 MG/3ML) 0.083% nebulizer solution Take 3 mLs (2.5 mg total) by nebulization every 6 (six) hours as needed for wheezing or shortness of breath. 06/27/20   Tanda Rockers, MD  albuterol (VENTOLIN HFA) 108 (90 Base) MCG/ACT inhaler Inhale 2 puffs into the lungs every 4 (four) hours as needed for wheezing or shortness of breath. 05/07/20    Tanda Rockers, MD  arformoterol (BROVANA) 15 MCG/2ML NEBU Take 2 mLs (15 mcg total) by nebulization 2 (two) times daily. 10/31/19   Martyn Ehrich, NP  Ascorbic Acid (VITAMIN C WITH ROSE HIPS) 500 MG tablet Take 500 mg by mouth daily.    [provider]  azithromycin (ZITHROMAX) 250 MG tablet Take 2 on day one then 1 daily x 4 days 06/22/20   Tanda Rockers, MD  budesonide (PULMICORT) 0.5 MG/2ML nebulizer solution Take 2 mLs (0.5 mg total) by nebulization in the morning and at bedtime. DX: J44.9 11/28/19   Tanda Rockers,  MD  buPROPion (WELLBUTRIN SR) 150 MG 12 hr tablet Take 150 mg by mouth daily.  05/26/17   [provider]  cholecalciferol (VITAMIN D) 1000 UNITS tablet Take 1,000 Units by mouth daily.    [provider]  diltiazem (CARDIZEM CD) 360 MG 24 hr capsule Take 360 mg by mouth daily.  12/05/17   [provider]  formoterol (PERFOROMIST) 20 MCG/2ML nebulizer solution Take 2 mLs (20 mcg total) by nebulization 2 (two) times daily. DX: J44.9 11/28/19   Tanda Rockers, MD  furosemide (LASIX) 20 MG tablet Take 40 mg by mouth daily as needed for fluid.     [provider]  Garlic 330 MG TABS Take 1 tablet by mouth daily.    [provider]  HYDROcodone-acetaminophen (NORCO) 7.5-325 MG tablet Take 1 tablet by mouth every 4 (four) hours. 08/03/19   [provider]  hydrOXYzine (ATARAX/VISTARIL) 25 MG tablet Take 25 mg by mouth 3 (three) times daily as needed for anxiety. 08/03/19   [provider]  lidocaine (LIDODERM) 5 % Place 1 patch onto the skin daily as needed. Apply patch to area most significant pain once per day.  Remove and discard patch within 12 hours of application. 01/16/20   Petrucelli, Samantha R, PA-C  losartan (COZAAR) 25 MG tablet Take 1 tablet (25 mg total) by mouth daily. 08/24/19   Harold Hedge, MD  Multiple Minerals-Vitamins (CAL MAG ZINC +D3 PO) Take 1 tablet by mouth at bedtime.    [provider]  ondansetron (ZOFRAN) 4 MG tablet Take 2-4 mg by mouth every 6 (six) hours as needed for nausea/vomiting. 07/21/19   [provider]  OXYGEN Inhale 3 L into the lungs. 24/7 DME- [employer]    [provider]  potassium chloride (K-DUR) 10 MEQ tablet Take 10 mEq by mouth daily as needed (swelling). When takes lasix  10/13/17   [provider]  predniSONE (DELTASONE) 10 MG tablet Take  4 each am x 2 days,   2 each am x 2 days,  1 each am x 2 days and stop 06/22/20   Tanda Rockers, MD  predniSONE (DELTASONE) 10 MG tablet Take 4 tabs by mouth for 3 days, then 3 for 3 days, 2 for 3 days, 1 for 3 days and stop 06/27/20   Tanda Rockers, MD  Respiratory Therapy Supplies (FLUTTER) DEVI Use as directed 02/05/16   Tanda Rockers, MD  revefenacin (YUPELRI) 175 MCG/3ML nebulizer solution Take 3 mLs (175 mcg total) by nebulization daily. DX: J44.9 11/28/19   Tanda Rockers, MD  spironolactone (ALDACTONE) 25 MG tablet Take 1 tablet by mouth 2 (two) times daily. 06/03/18   [provider]    Allergies    Tramadol  Review of Systems   Review of Systems  Constitutional: Negative for fever.  Respiratory: Positive for cough and shortness of breath.   Cardiovascular: Positive for chest pain and leg swelling.  Gastrointestinal: Negative for abdominal pain, nausea and vomiting.  Genitourinary: Negative for dysuria and hematuria.  Neurological: Negative for headaches.  All other systems reviewed and are negative.   Physical Exam Updated Vital Signs BP 137/80   Pulse (!) 132   Temp 98 F (36.7 C) (Oral)   Resp 20   Ht 5' 2" (1.575 m)   Wt 49 kg   SpO2 100%   BMI 19.75 kg/m   Physical Exam Vitals and nursing note reviewed.  Constitutional:  Appearance: She is well-nourished. She is ill-appearing.  HENT:     Head: Normocephalic and atraumatic.     Mouth/Throat:     Mouth: Oropharynx is clear and moist and mucous membranes are normal.  Eyes:     General: Lids  are normal.     Extraocular Movements: EOM normal.     Conjunctiva/sclera: Conjunctivae normal.     Pupils: Pupils are equal, round, and reactive to light.  Cardiovascular:     Rate and Rhythm: Normal rate. Rhythm irregularly irregular.     Pulses: Normal pulses.     Heart sounds: Normal heart sounds. No murmur heard. No friction rub. No gallop.      Comments: 3+ pitting edema bilateral lower extremities. Pulmonary:     Effort: Tachypnea present.     Breath sounds: Wheezing present.     Comments: Patient currently on nonrebreather.  Wheezing noted throughout all lung fields.  Tachypnea, increased work of breathing noted. Abdominal:     Palpations: Abdomen is soft. Abdomen is not rigid.     Tenderness: There is no abdominal tenderness. There is no guarding.     Comments: Abdomen is soft, non-distended, non-tender. No rigidity, No guarding. No peritoneal signs.  Musculoskeletal:        General: Normal range of motion.     Cervical back: Full passive range of motion without pain.     Right lower leg: 3+ Edema present.     Left lower leg: 3+ Edema present.     Comments: Chronic venous stasis changes noted to BLE  Skin:    General: Skin is warm and dry.     Capillary Refill: Capillary refill takes less than 2 seconds.  Neurological:     Mental Status: She is alert and oriented to person, place, and time.  Psychiatric:        Mood and Affect: Mood and affect normal.        Speech: Speech normal.     ED Results / Procedures / Treatments   Labs (all labs ordered are listed, but only abnormal results are displayed) Labs Reviewed  RESP PANEL BY RT-PCR (FLU A&B, COVID) ARPGX2 - Abnormal; Notable for the following components:      Result Value   SARS Coronavirus 2 by RT PCR POSITIVE (*)    All other components within normal limits  COMPREHENSIVE METABOLIC PANEL - Abnormal; Notable for the following components:   Chloride 97 (*)    Glucose, Bld 106 (*)    Calcium 8.4 (*)    Total  Protein 5.4 (*)    Albumin 2.7 (*)    AST 614 (*)    ALT 166 (*)    Alkaline Phosphatase 166 (*)    Total Bilirubin 1.6 (*)    Anion gap 22 (*)    All other components within normal limits  CBC WITH DIFFERENTIAL/PLATELET - Abnormal; Notable for the following components:   RBC 2.76 (*)    Hemoglobin 8.6 (*)    HCT 28.1 (*)    MCV 101.8 (*)    RDW 18.2 (*)    Platelets 119 (*)    nRBC 3.3 (*)    Monocytes Absolute 1.6 (*)    Abs Immature Granulocytes 0.47 (*)    All other components within normal limits  BRAIN NATRIURETIC PEPTIDE - Abnormal; Notable for the following components:   B Natriuretic Peptide 104.4 (*)    All other components within normal limits  LACTIC ACID, PLASMA - Abnormal; Notable for  the following components:   Lactic Acid, Venous 6.4 (*)    All other components within normal limits  TROPONIN I (HIGH SENSITIVITY) - Abnormal; Notable for the following components:   Troponin I (High Sensitivity) 37 (*)    All other components within normal limits  LACTIC ACID, PLASMA  PATHOLOGIST SMEAR REVIEW  HEPATITIS PANEL, ACUTE  TSH  PROTIME-INR  TROPONIN I (HIGH SENSITIVITY)    EKG EKG Interpretation  Date/Time:  Friday July 27 2020 14:55:08 EST Ventricular Rate:  186 PR Interval:    QRS Duration: 104 QT Interval:  255 QTC Calculation: 444 R Axis:   85 Text Interpretation: Atrial fibrillation with rapid V-rate Borderline right axis deviation Low voltage, precordial leads Repolarization abnormality, prob rate related Artifact in lead(s) I III aVR aVL aVF V1 V6 Confirmed by Octaviano Glow [phone]) on 07/27/2020 3:02:41 PM   Radiology DG Chest Portable 1 View  Result Date: 07/27/2020 CLINICAL DATA:  SOB EXAM: PORTABLE CHEST 1 VIEW COMPARISON:  01/16/2020 and prior. FINDINGS: Emphysematous changes. Patchy right predominant basilar opacities. No pneumothorax or pleural effusion. Cardiomediastinal silhouette within normal limits. IMPRESSION: Right predominant  basilar opacities, atelectasis versus infiltrate. Emphysema. Electronically Signed   By: Primitivo Gauze M.D.   On: 07/27/2020 15:25    Procedures .Critical Care Performed by: Volanda Napoleon, PA-C Authorized by: Volanda Napoleon, PA-C   Critical care provider statement:    Critical care time (minutes):  35   Critical care was necessary to treat or prevent imminent or life-threatening deterioration of the following conditions:  Respiratory failure   Critical care was time spent personally by me on the following activities:  Discussions with consultants, evaluation of patient's response to treatment, examination of patient, ordering and performing treatments and interventions, ordering and review of laboratory studies, ordering and review of radiographic studies, pulse oximetry, re-evaluation of patient's condition, obtaining history from patient or surrogate and review of old charts     Medications Ordered in ED Medications  diltiazem (CARDIZEM) 125 mg in dextrose 5% 125 mL (1 mg/mL) infusion (10 mg/hr Intravenous Rate/Dose Change 07/27/20 1819)  LORazepam (ATIVAN) injection 1 mg (has no administration in time range)  sodium chloride 0.9 % bolus 500 mL (has no administration in time range)  albuterol (VENTOLIN HFA) 108 (90 Base) MCG/ACT inhaler 1-2 puff (2 puffs Inhalation Given 07/27/20 1523)  diltiazem (CARDIZEM) injection 10 mg (10 mg Intravenous Given 07/27/20 1507)  furosemide (LASIX) injection 40 mg (40 mg Intravenous Given 07/27/20 1633)  sodium chloride 0.9 % bolus 500 mL (0 mLs Intravenous Stopped 07/27/20 1736)  LORazepam (ATIVAN) injection 2 mg (2 mg Intravenous Given 07/27/20 1633)    ED Course  I have reviewed the triage vital signs and the nursing notes.  Pertinent labs & imaging results that were available during my care of the patient were reviewed by me and considered in my medical decision making (see chart for details).  Clinical Course as of 07/27/20 1914  [name]  Jul 27, 2020  1626 I was directly involved in this patients medical care.  [JH]    Clinical Course User Index [JH] [place], Greggory Brandy, MD   MDM Rules/Calculators/A&P                          74 year old female who presents for evaluation of shortness of breath, tachycardia.  EMS noted patient to be 74 on room air when they arrived.  Patient was placed on nonrebreather which improved her  O2 sats.  She was given Solu-Medrol, DuoNebs, albuterol with some improvement.  Patient also found to be tacky between 160 and 180.  Appears to be A. fib with RVR.  Patient is on diltiazem but she says she takes it for blood pressure.  No prior history of A. Fib.  On initial arrival, patient is tachycardic, afebrile.  Vitals otherwise stable.  On exam, she has diffuse wheezing.  Concern for infectious process versus COPD exacerbation.  We will plan to check labs, chest x-ray.  On further evaluation, patient still tachycardic.  She is tremulous.  Question if this is withdrawal from alcohol.  She drinks alcohol daily and states she has not drank in the last day or so.  Will give Ativan.  Will give 500 cc of fluid. Will hold on aggressive resuscitation secondary to heart failure status.   CBC shows no leukocytosis.  Hemoglobin is 8.6.  This is around her baseline.  Her initial troponin 37.  Likely from demand ischemia.  CMP shows normal BUN/creatinine.  LFTs are slightly elevated.  She has had prior history of this.  This is slightly worse.  We will add on a hepatitis panel.  Patient is Covid positive. Lactic is 6.4.  Likely from dehydration.  BNP is 104.4.  Chest x-ray shows right predominant basilar opacities.  Atelectasis versus infiltrate.  Patient's heart rate improved to 120s after Ativan.  Discussed patient with Dr. Terrence Dupont (cards). Recommends starting patient on Heparin drip. He can be consulted as needed.   Discussed patient with Dr. Jonelle Sidle (hospitalist) who accepts patient.   Portions of this note were  generated with [employer]. Dictation errors may occur despite best attempts at proofreading.  Final Clinical Impression(s) / ED Diagnoses Final diagnoses:  COVID-19  Hypoxia  Alcohol withdrawal syndrome without complication (HCC)  Atrial fibrillation with rapid ventricular response [facility])    Rx / DC Orders ED Discharge Orders    None       Desma Mcgregor 07/27/20 1915    Luna Fuse, MD 07/27/20 1919

## 2020-07-27 NOTE — ED Triage Notes (Addendum)
Called EMS at 1300 with SOB and CP, has a roommate but she self cares, nonprod cough for past two weeks, copd exacerbation, severe weakness, on the couch for two weeks, has taken none of her meds for the past two weeks, initial sao2 was 74percent on RA, has had 33m albuterol and 165matrovent and 125 mg solumedrol

## 2020-07-27 NOTE — ED Notes (Signed)
cardizem increased to 38m/hrm HR 127, B/P 137/65

## 2020-07-27 NOTE — ED Notes (Signed)
cardizem drip increased to 7.61m/hr, b/p 137/80, HR 132

## 2020-07-27 NOTE — ED Notes (Signed)
Got patient on the monitor did ekg shown to the er doctor got patient into a gown patient is resting with nurse at bedside

## 2020-07-27 NOTE — ED Notes (Signed)
Attempted to call report.

## 2020-07-28 ENCOUNTER — Inpatient Hospital Stay (HOSPITAL_COMMUNITY): Payer: Medicare Other

## 2020-07-28 DIAGNOSIS — I4891 Unspecified atrial fibrillation: Secondary | ICD-10-CM

## 2020-07-28 DIAGNOSIS — J441 Chronic obstructive pulmonary disease with (acute) exacerbation: Secondary | ICD-10-CM | POA: Diagnosis not present

## 2020-07-28 DIAGNOSIS — J9611 Chronic respiratory failure with hypoxia: Secondary | ICD-10-CM | POA: Diagnosis not present

## 2020-07-28 DIAGNOSIS — F10230 Alcohol dependence with withdrawal, uncomplicated: Secondary | ICD-10-CM

## 2020-07-28 LAB — COMPREHENSIVE METABOLIC PANEL
ALT: 268 U/L — ABNORMAL HIGH (ref 0–44)
ALT: 270 U/L — ABNORMAL HIGH (ref 0–44)
AST: 1154 U/L — ABNORMAL HIGH (ref 15–41)
AST: 938 U/L — ABNORMAL HIGH (ref 15–41)
Albumin: 2.7 g/dL — ABNORMAL LOW (ref 3.5–5.0)
Albumin: 2.7 g/dL — ABNORMAL LOW (ref 3.5–5.0)
Alkaline Phosphatase: 163 U/L — ABNORMAL HIGH (ref 38–126)
Alkaline Phosphatase: 141 U/L — ABNORMAL HIGH (ref 38–126)
Anion gap: 20 — ABNORMAL HIGH (ref 5–15)
Anion gap: 17 — ABNORMAL HIGH (ref 5–15)
BUN: 10 mg/dL (ref 8–23)
BUN: 10 mg/dL (ref 8–23)
CO2: 28 mmol/L (ref 22–32)
CO2: 34 mmol/L — ABNORMAL HIGH (ref 22–32)
Calcium: 7.9 mg/dL — ABNORMAL LOW (ref 8.9–10.3)
Calcium: 8.2 mg/dL — ABNORMAL LOW (ref 8.9–10.3)
Chloride: 93 mmol/L — ABNORMAL LOW (ref 98–111)
Chloride: 93 mmol/L — ABNORMAL LOW (ref 98–111)
Creatinine, Ser: 0.92 mg/dL (ref 0.44–1.00)
Creatinine, Ser: 0.76 mg/dL (ref 0.44–1.00)
GFR, Estimated: >60 mL/min (ref >60)
GFR, Estimated: >60 mL/min (ref >60)
Glucose, Bld: 215 mg/dL — ABNORMAL HIGH (ref 70–99)
Glucose, Bld: 147 mg/dL — ABNORMAL HIGH (ref 70–99)
Potassium: 4.3 mmol/L (ref 3.5–5.1)
Potassium: 4.3 mmol/L (ref 3.5–5.1)
Sodium: 141 mmol/L (ref 135–145)
Sodium: 144 mmol/L (ref 135–145)
Total Bilirubin: 1.8 mg/dL — ABNORMAL HIGH (ref 0.3–1.2)
Total Bilirubin: 1.6 mg/dL — ABNORMAL HIGH (ref 0.3–1.2)
Total Protein: 5.3 g/dL — ABNORMAL LOW (ref 6.5–8.1)
Total Protein: 5.4 g/dL — ABNORMAL LOW (ref 6.5–8.1)

## 2020-07-28 LAB — CBC
HCT: 23 % — ABNORMAL LOW (ref 36.0–46.0)
Hemoglobin: 7.8 g/dL — ABNORMAL LOW (ref 12.0–15.0)
MCH: 32.4 pg (ref 26.0–34.0)
MCHC: 33.9 g/dL (ref 30.0–36.0)
MCV: 95.4 fL (ref 80.0–100.0)
Platelets: 118 10*3/uL — ABNORMAL LOW (ref 150–400)
RBC: 2.41 MIL/uL — ABNORMAL LOW (ref 3.87–5.11)
RDW: 17.7 % — ABNORMAL HIGH (ref 11.5–15.5)
WBC: 7.4 10*3/uL (ref 4.0–10.5)
nRBC: 1.6 % — ABNORMAL HIGH (ref 0.0–0.2)

## 2020-07-28 LAB — RETICULOCYTES
Immature Retic Fract: 22.7 % — ABNORMAL HIGH (ref 2.3–15.9)
RBC.: 2.56 MIL/uL — ABNORMAL LOW (ref 3.87–5.11)
Retic Count, Absolute: 59.4 10*3/uL (ref 19.0–186.0)
Retic Ct Pct: 2.3 % (ref 0.4–3.1)

## 2020-07-28 LAB — ECHOCARDIOGRAM LIMITED
Area-P 1/2: 4.1 cm2
Height: 62 in
S' Lateral: 2.5 cm
Weight: 1728 oz

## 2020-07-28 LAB — MAGNESIUM: Magnesium: 1.5 mg/dL — ABNORMAL LOW (ref 1.7–2.4)

## 2020-07-28 LAB — PHOSPHORUS: Phosphorus: 3.4 mg/dL (ref 2.5–4.6)

## 2020-07-28 LAB — HEPARIN LEVEL (UNFRACTIONATED)
Heparin Unfractionated: 0.26 IU/mL — ABNORMAL LOW (ref 0.30–0.70)
Heparin Unfractionated: 0.25 IU/mL — ABNORMAL LOW (ref 0.30–0.70)

## 2020-07-28 LAB — IRON AND TIBC
Iron: 52 ug/dL (ref 28–170)
Saturation Ratios: 40 % — ABNORMAL HIGH (ref 10.4–31.8)
TIBC: 129 ug/dL — ABNORMAL LOW (ref 250–450)
UIBC: 77 ug/dL

## 2020-07-28 LAB — FOLATE: Folate: 10.9 ng/mL (ref >5.9)

## 2020-07-28 LAB — C-REACTIVE PROTEIN: CRP: 8.1 mg/dL — ABNORMAL HIGH (ref <1.0)

## 2020-07-28 LAB — PROCALCITONIN: Procalcitonin: 0.61 ng/mL

## 2020-07-28 LAB — LIPID PANEL
Cholesterol: 136 mg/dL (ref 0–200)
HDL: 83 mg/dL (ref >40)
LDL Cholesterol: 43 mg/dL (ref 0–99)
Total CHOL/HDL Ratio: 1.6 RATIO
Triglycerides: 48 mg/dL (ref <150)
VLDL: 10 mg/dL (ref 0–40)

## 2020-07-28 LAB — VITAMIN B12: Vitamin B-12: 1367 pg/mL — ABNORMAL HIGH (ref 180–914)

## 2020-07-28 LAB — FERRITIN: Ferritin: 2074 ng/mL — ABNORMAL HIGH (ref 11–307)

## 2020-07-28 LAB — TSH: TSH: 0.953 u[IU]/mL (ref 0.350–4.500)

## 2020-07-28 LAB — T4, FREE: Free T4: 0.94 ng/dL (ref 0.61–1.12)

## 2020-07-28 LAB — D-DIMER, QUANTITATIVE: D-Dimer, Quant: 1.93 ug/mL-FEU — ABNORMAL HIGH (ref 0.00–0.50)

## 2020-07-28 MED ORDER — ASPIRIN 81 MG PO CHEW
81.0000 mg | CHEWABLE_TABLET | Freq: Every day | ORAL | Status: DC
  Administered 2020-07-28 – 2020-08-07 (×11): 81 mg via ORAL
  Filled 2020-07-28 (×11): qty 1

## 2020-07-28 MED ORDER — ORAL CARE MOUTH RINSE
15.0000 mL | Freq: Two times a day (BID) | OROMUCOSAL | Status: DC
  Administered 2020-07-28 – 2020-08-07 (×20): 15 mL via OROMUCOSAL

## 2020-07-28 MED ORDER — UMECLIDINIUM BROMIDE 62.5 MCG/INH IN AEPB
1.0000 | INHALATION_SPRAY | Freq: Every day | RESPIRATORY_TRACT | Status: DC
  Administered 2020-07-28 – 2020-08-07 (×11): 1 via RESPIRATORY_TRACT
  Filled 2020-07-28 (×2): qty 7

## 2020-07-28 MED ORDER — DILTIAZEM HCL 60 MG PO TABS
60.0000 mg | ORAL_TABLET | Freq: Four times a day (QID) | ORAL | Status: DC
  Administered 2020-07-28 – 2020-08-04 (×28): 60 mg via ORAL
  Administered 2020-08-04: 30 mg via ORAL
  Administered 2020-08-04 – 2020-08-07 (×12): 60 mg via ORAL
  Filled 2020-07-28 (×41): qty 1

## 2020-07-28 MED ORDER — ALUM & MAG HYDROXIDE-SIMETH 200-200-20 MG/5ML PO SUSP: 30.0000 mL | Freq: Four times a day (QID) | ORAL | Status: DC | PRN

## 2020-07-28 MED ORDER — MORPHINE SULFATE (PF) 2 MG/ML IV SOLN
1.0000 mg | INTRAVENOUS | Status: DC | PRN
  Administered 2020-07-28 – 2020-07-29 (×4): 2 mg via INTRAVENOUS
  Filled 2020-07-28 (×5): qty 1

## 2020-07-28 MED ORDER — PANTOPRAZOLE SODIUM 40 MG PO TBEC
40.0000 mg | DELAYED_RELEASE_TABLET | Freq: Every day | ORAL | Status: DC
  Administered 2020-07-28 – 2020-08-07 (×11): 40 mg via ORAL
  Filled 2020-07-28 (×11): qty 1

## 2020-07-28 MED ORDER — ENSURE ENLIVE PO LIQD
237.0000 mL | Freq: Two times a day (BID) | ORAL | Status: DC
  Administered 2020-07-28 – 2020-07-30 (×5): 237 mL via ORAL

## 2020-07-28 MED ORDER — METHYLPREDNISOLONE SODIUM SUCC 40 MG IJ SOLR
40.0000 mg | Freq: Two times a day (BID) | INTRAMUSCULAR | Status: DC
  Administered 2020-07-28 – 2020-08-07 (×21): 40 mg via INTRAVENOUS
  Filled 2020-07-28 (×21): qty 1

## 2020-07-28 MED ORDER — MAGNESIUM SULFATE 4 GM/100ML IV SOLN
4.0000 g | Freq: Once | INTRAVENOUS | Status: AC
  Administered 2020-07-28: 4 g via INTRAVENOUS
  Filled 2020-07-28: qty 100

## 2020-07-28 MED ORDER — LEVALBUTEROL TARTRATE 45 MCG/ACT IN AERO
2.0000 | INHALATION_SPRAY | Freq: Four times a day (QID) | RESPIRATORY_TRACT | Status: DC
  Administered 2020-07-28 – 2020-08-02 (×20): 2 via RESPIRATORY_TRACT
  Filled 2020-07-28: qty 15

## 2020-07-28 MED ORDER — DOXYCYCLINE HYCLATE 100 MG PO TABS
100.0000 mg | ORAL_TABLET | Freq: Two times a day (BID) | ORAL | Status: AC
  Administered 2020-07-28 – 2020-08-01 (×10): 100 mg via ORAL
  Filled 2020-07-28 (×10): qty 1

## 2020-07-28 MED ORDER — DILTIAZEM HCL 60 MG PO TABS: 60.0000 mg | ORAL_TABLET | Freq: Four times a day (QID) | ORAL | Status: DC

## 2020-07-28 NOTE — Progress Notes (Signed)
  Echocardiogram 2D Echocardiogram has been performed.  [name] 07/28/2020, 3:38 PM

## 2020-07-28 NOTE — Progress Notes (Signed)
ANTICOAGULATION CONSULT NOTE - Initial Consult  Pharmacy Consult for Heparin Indication: atrial fibrillation  Allergies  Allergen Reactions  . Tramadol Other (See Comments)    GI UPSET    Patient Measurements: Height: _0  (157.5 cm) Weight: 49 kg (108 lb) IBW/kg (Calculated) : 50.1 Heparin Dosing Weight: 49 kg   Vital Signs: Temp: 97.9 F (36.6 C) (02/12 1136) Temp Source: Oral (02/12 1136) BP: 114/78 (02/12 1136) Pulse Rate: 84 (02/12 1136)  Labs: Recent Labs    07/27/20 1515 07/27/20 2047 07/27/20 2208 07/28/20 0216 07/28/20 0743 07/28/20 1139  HGB 8.6*  --   --  7.8*  --   --   HCT 28.1*  --   --  23.0*  --   --   PLT 119*  --   --  118*  --   --   LABPROT  --   --  12.8  --   --   --   INR  --   --  1.0  --   --   --   HEPARINUNFRC  --   --   --  0.26*  --  0.25*  CREATININE 0.74  --   --  0.92 0.76  --   TROPONINIHS 37* 85*  --   --   --   --     Estimated Creatinine Clearance: 47.7 mL/min (by C-G formula based on SCr of 0.76 mg/dL).   Medical History: Past Medical History:  Diagnosis Date  . Abrasion    under left knee x 2 places changing dressing q day of bid, applying ssd cream area healing due to fell 2 weeks ago  . Blood dyscrasia    BLEEDS EASY  . Bruises easily    bleeds easily  . Closed patellar sleeve fracture of left knee 01/04/2018  . Closed patellar sleeve fracture of right knee 01/04/2018   healing   . COPD (chronic obstructive pulmonary disease) ([place])   . Coronary artery disease    per dr Philis Kendall note 01-01-18 note  . Hernia, inguinal    LEFT  . Hypertension    states under control with meds., has been on med. > 10 yr.  . On home oxygen therapy    at night 2L/min  . Pneumonia    "SEVERAL TIMES IN PAST, LAST TIME 2018"  . Pulmonary hypertension ([place])    per dr Terrence Dupont 7-19 19 lov note  . Shortness of breath dyspnea    with exertion  . SVD (spontaneous vaginal delivery)    x 1  . Thin skin   . Urinary tract infection  2019  . Wears partial dentures    lower partial and upper plate    Assessment: 75 yo female presents with afib with RVR. No AC noted PTA. Pharmacy consulted to start heparin. Hgb low 7.8, down from previous 8.6. Platelets 118.    Heparin level is subtherapeutic at 0.25 while running at 800 units/hr. The heparin level decreased slightly despite an increase in rate, however, the RN denies any issues with the infusion and states the patient is without signs or symptoms of bleeding.   Goal of Therapy:  Heparin level 0.3-0.7 units/ml Monitor platelets by anticoagulation protocol: Yes   Plan:   Increase heparin IV to 900 units/hr Monitor daily HL and CBC Monitor for signs and symptoms of bleeding Follow future anticoagulation plans  Shauna Hugh, PharmD, [place]  PGY-1 Pharmacy Resident 07/28/2020 12:49 PM  Please check [email or web address] for unit-specific pharmacy phone  numbers.

## 2020-07-28 NOTE — Progress Notes (Signed)
Unable to complete admision questions due to patient confusion/weakness.

## 2020-07-28 NOTE — Progress Notes (Signed)
[facility] for Heparin Indication: atrial fibrillation  Allergies  Allergen Reactions  . Tramadol Other (See Comments)    GI UPSET    Patient Measurements: Height: _0  (157.5 cm) Weight: 49 kg (108 lb) IBW/kg (Calculated) : 50.1 Heparin Dosing Weight: 49 kg   Vital Signs: Temp: 99 F (37.2 C) (02/11 2354) Temp Source: Oral (02/11 2354) BP: 146/66 (02/11 2354) Pulse Rate: 106 (02/11 2354)  Labs: Recent Labs    07/27/20 1515 07/27/20 2047 07/27/20 2208 07/28/20 0216  HGB 8.6*  --   --  7.8*  HCT 28.1*  --   --  23.0*  PLT 119*  --   --  118*  LABPROT  --   --  12.8  --   INR  --   --  1.0  --   HEPARINUNFRC  --   --   --  0.26*  CREATININE 0.74  --   --  0.92  TROPONINIHS 37* 85*  --   --     Estimated Creatinine Clearance: 41.5 mL/min (by C-G formula based on SCr of 0.92 mg/dL).   Assessment: 75 y.o. female with Afib for heparin  Goal of Therapy:  Heparin level 0.3-0.7 units/ml Monitor platelets by anticoagulation protocol: Yes   Plan:   Increase Heparin 800 units/hr Check heparin level in 8 hours.  Phillis Knack, PharmD, BCPS

## 2020-07-28 NOTE — Consult Note (Signed)
Reason for Consult:paroxysmal A. Fib with RVR/minimally elevated sensitivity troponin I Referring Physician:[facility] hospitalist  [name] is an 75 y.o. female.  [name] is 75 year old female with past medical history is significant for COPD, pulmonary hypertension, chronic respiratory failure on home O2 3 L/m by nasal cannula,seizure.  Protein calorie malnutrition, failure to thrive, history of recurrent falls in recent past, anemia of chronic disease, hypertension, hyperlipidemia, tobacco abuse, EtOH abuse, chronic venous insufficiency, eczematous dermatitis,was admitted yesterday because of progressive increasing shortness of breath associated with pleuritic chest pain and palpitation noted to be in A. Fib with RVR.  Patient was started on IV Cardizem and heparin with control of heart rate and subsequently conversion to sinus rhythm.  Patient was also noted to have minimally elevated troponin I patient was attributed to demand ischemia.  Patient denies any anginal chest pain.  States continues to drink 1-2 beers daily.  She used to drink 4-5 beers daily in the past.  Patient denies any dark tarry stools or bright red blood per rectum.  Denies any NSAID abuse.  Denies hematuria.patient has been vaccinated for covid  19, noted to have positive covid 19, test in the ED.patient denies any fever or chills.  Past Medical History:  Diagnosis Date  . Abrasion    under left knee x 2 places changing dressing q day of bid, applying ssd cream area healing due to fell 2 weeks ago  . Blood dyscrasia    BLEEDS EASY  . Bruises easily    bleeds easily  . Closed patellar sleeve fracture of left knee 01/04/2018  . Closed patellar sleeve fracture of right knee 01/04/2018   healing   . COPD (chronic obstructive pulmonary disease) ([place])   . Coronary artery disease    per dr Philis Kendall note 01-01-18 note  . Hernia, inguinal    LEFT  . Hypertension    states under control with meds., has been on med. > 10  yr.  . On home oxygen therapy    at night 2L/min  . Pneumonia    "SEVERAL TIMES IN PAST, LAST TIME 2018"  . Pulmonary hypertension ([place])    per dr Terrence Dupont 7-19 19 lov note  . Shortness of breath dyspnea    with exertion  . SVD (spontaneous vaginal delivery)    x 1  . Thin skin   . Urinary tract infection 2019  . Wears partial dentures    lower partial and upper plate    Past Surgical History:  Procedure Laterality Date  . COLONOSCOPY     Less than 10 years ago per patient   . CYSTOCELE REPAIR N/A 03/02/2018   Procedure: ANTERIOR REPAIR (CYSTOCELE);  Surgeon: Cheri Fowler, MD;  Location: [facility];  Service: Gynecology;  Laterality: N/A;  OUTPATIENT IN BED  . MULTIPLE TOOTH EXTRACTIONS    . RESECTION DISTAL CLAVICAL Left 10/12/2014   Procedure: DISTAL CLAVICLE EXCISION;  Surgeon: Kathryne Hitch, MD;  Location: [facility];  Service: Orthopedics;  Laterality: Left;  . SHOULDER ARTHROSCOPY WITH ROTATOR CUFF REPAIR AND SUBACROMIAL DECOMPRESSION Left 10/12/2014   Procedure: LEFT SHOULDER ARTHROSCOPY, DEBRIDEMENT WITH ACROMIOPLASTY,  ROTATOR CUFF REPAIR AND RELEASE BICEPS TENDON;  Surgeon: Kathryne Hitch, MD;  Location: [facility];  Service: Orthopedics;  Laterality: Left;    Family History  Problem Relation Age of Onset  . Heart disease Father   . Kidney failure Father   . Colon cancer Neg Hx   . Esophageal cancer Neg Hx  Social History:  reports that she quit smoking about 3 years ago. Her smoking use included cigarettes. She has a 57.00 pack-year smoking history. She has never used smokeless tobacco. She reports current alcohol use of about 28.0 standard drinks of alcohol per week. She reports that she does not use drugs.  Allergies:  Allergies  Allergen Reactions  . Tramadol Other (See Comments)    GI UPSET    Medications: I have reviewed the patient's current medications.  Results for orders placed or performed during the hospital encounter  of 07/27/20 (from the past 48 hour(s))  Comprehensive metabolic panel     Status: Abnormal   Collection Time: 07/27/20  3:15 PM  Result Value Ref Range   Sodium 143 135 - 145 mmol/L   Potassium 4.4 3.5 - 5.1 mmol/L   Chloride 97 (L) 98 - 111 mmol/L   CO2 24 22 - 32 mmol/L   Glucose, Bld 106 (H) 70 - 99 mg/dL    Comment: Glucose reference range applies only to samples taken after fasting for at least 8 hours.   BUN 17 8 - 23 mg/dL   Creatinine, Ser 0.74 0.44 - 1.00 mg/dL   Calcium 8.4 (L) 8.9 - 10.3 mg/dL   Total Protein 5.4 (L) 6.5 - 8.1 g/dL   Albumin 2.7 (L) 3.5 - 5.0 g/dL   AST 614 (H) 15 - 41 U/L   ALT 166 (H) 0 - 44 U/L   Alkaline Phosphatase 166 (H) 38 - 126 U/L   Total Bilirubin 1.6 (H) 0.3 - 1.2 mg/dL   GFR, Estimated >60 >60 mL/min    Comment: (NOTE) Calculated using the CKD-EPI Creatinine Equation (2021)    Anion gap 22 (H) 5 - 15    Comment: Performed at [facility], [place] [address] [place], [place] [address]  CBC with Differential     Status: Abnormal   Collection Time: 07/27/20  3:15 PM  Result Value Ref Range   WBC 8.2 4.0 - 10.5 K/uL   RBC 2.76 (L) 3.87 - 5.11 MIL/uL   Hemoglobin 8.6 (L) 12.0 - 15.0 g/dL   HCT 28.1 (L) 36.0 - 46.0 %   MCV 101.8 (H) 80.0 - 100.0 fL   MCH 31.2 26.0 - 34.0 pg   MCHC 30.6 30.0 - 36.0 g/dL   RDW 18.2 (H) 11.5 - 15.5 %   Platelets 119 (L) 150 - 400 K/uL    Comment: REPEATED TO VERIFY PLATELET COUNT CONFIRMED BY SMEAR Immature Platelet Fraction may be clinically indicated, consider ordering this additional test [id number]    nRBC 3.3 (H) 0.0 - 0.2 %   Neutrophils Relative % 51 %   Neutro Abs 4.2 1.7 - 7.7 K/uL   Lymphocytes Relative 23 %   Lymphs Abs 1.9 0.7 - 4.0 K/uL   Monocytes Relative 19 %   Monocytes Absolute 1.6 (H) 0.1 - 1.0 K/uL   Eosinophils Relative 0 %   Eosinophils Absolute 0.0 0.0 - 0.5 K/uL   Basophils Relative 1 %   Basophils Absolute 0.1 0.0 - 0.1 K/uL   WBC Morphology MILD LEFT SHIFT (1-5% METAS,  OCC MYELO, OCC BANDS)    Immature Granulocytes 6 %   Abs Immature Granulocytes 0.47 (H) 0.00 - 0.07 K/uL    Comment: Performed at [facility], [place] [address], [place], [place] [address]  Troponin I (High Sensitivity)     Status: Abnormal   Collection Time: 07/27/20  3:15 PM  Result Value Ref Range  Troponin I (High Sensitivity) 37 (H) <18 ng/L    Comment: (NOTE) Elevated high sensitivity troponin I (hsTnI) values and significant  changes across serial measurements may suggest ACS but many other  chronic and acute conditions are known to elevate hsTnI results.  Refer to the Links section for chest pain algorithms and additional  guidance. Performed at [facility], [place] [address], [place], [place] [address]   Brain natriuretic peptide     Status: Abnormal   Collection Time: 07/27/20  3:15 PM  Result Value Ref Range   B Natriuretic Peptide 104.4 (H) 0.0 - 100.0 pg/mL    Comment: Performed at [facility] [address] [place], [place] [address]  Lactic acid, plasma     Status: Abnormal   Collection Time: 07/27/20  3:15 PM  Result Value Ref Range   Lactic Acid, Venous 6.4 (HH) 0.5 - 1.9 mmol/L    Comment: CRITICAL RESULT CALLED TO, READ BACK BY AND VERIFIED WITHWard Givens RN [phone] K FORSYTH Performed at [facility], [place] [address], [place], [place] [address]   Resp Panel by RT-PCR (Flu A&B, Covid) Nasopharyngeal Swab     Status: Abnormal   Collection Time: 07/27/20  3:28 PM   Specimen: Nasopharyngeal Swab; Nasopharyngeal(NP) swabs in vial transport medium  Result Value Ref Range   SARS Coronavirus 2 by RT PCR POSITIVE (A) NEGATIVE    Comment: RESULT CALLED TO, READ BACK BY AND VERIFIED WITH: Wyvonna Plum RN [id number] 07/27/20 A BROWNING (NOTE) SARS-CoV-2 target nucleic acids are DETECTED.  The SARS-CoV-2 RNA is generally detectable in upper respiratory specimens during the acute phase of infection. Positive results are indicative of the presence of the  identified virus, but do not rule out bacterial infection or co-infection with other pathogens not detected by the test. Clinical correlation with patient history and other diagnostic information is necessary to determine patient infection status. The expected result is Negative.  Fact Sheet for Patients: EntrepreneurPulse.com.au  Fact Sheet for Healthcare Providers: IncredibleEmployment.be  This test is not yet approved or cleared by the [place] FDA and  has been authorized for detection and/or diagnosis of SARS-CoV-2 by FDA under an Emergency Use Authorization (EUA).  This EUA will remain in effect (meaning this test can  be used) for the duration of  the COVID-19 declaration under Section 564(b)(1) of the Act, 21 U.S.C. section 360bbb-3(b)(1), unless the authorization is terminated or revoked sooner.     Influenza A by PCR NEGATIVE NEGATIVE   Influenza B by PCR NEGATIVE NEGATIVE    Comment: (NOTE) The Xpert Xpress SARS-CoV-2/FLU/RSV plus assay is intended as an aid in the diagnosis of influenza from Nasopharyngeal swab specimens and should not be used as a sole basis for treatment. Nasal washings and aspirates are unacceptable for Xpert Xpress SARS-CoV-2/FLU/RSV testing.  Fact Sheet for Patients: EntrepreneurPulse.com.au  Fact Sheet for Healthcare Providers: IncredibleEmployment.be  This test is not yet approved or cleared by the [place] FDA and has been authorized for detection and/or diagnosis of SARS-CoV-2 by FDA under an Emergency Use Authorization (EUA). This EUA will remain in effect (meaning this test can be used) for the duration of the COVID-19 declaration under Section 564(b)(1) of the Act, 21 U.S.C. section 360bbb-3(b)(1), unless the authorization is terminated or revoked.  Performed at [facility], [place] [name], [place], [place] [address]   Lactic acid, plasma      Status: Abnormal   Collection Time: 07/27/20  8:47 PM  Result Value Ref Range   Lactic Acid, Venous 4.0 (HH) 0.5 - 1.9 mmol/L    Comment: CRITICAL VALUE NOTED.  VALUE IS CONSISTENT WITH PREVIOUSLY REPORTED AND CALLED VALUE. Performed at [facility], [place] [address], [place], [place] [address]   Troponin I (High Sensitivity)     Status: Abnormal   Collection Time: 07/27/20  8:47 PM  Result Value Ref Range   Troponin I (High Sensitivity) 85 (H) <18 ng/L    Comment: RESULT CALLED TO, READ BACK BY AND VERIFIED WITH: Arabella Merles [facility] 07/28/20 2202 [id number] Performed at [place] [address] [place], [place] [address]   Hepatitis panel, acute     Status: None   Collection Time: 07/27/20  8:47 PM  Result Value Ref Range   Hepatitis B Surface Ag NON REACTIVE NON REACTIVE   HCV Ab NON REACTIVE NON REACTIVE    Comment: (NOTE) Nonreactive HCV antibody screen is consistent with no HCV infections,  unless recent infection is suspected or other evidence exists to indicate HCV infection.     Hep A IgM NON REACTIVE NON REACTIVE   Hep B C IgM NON REACTIVE NON REACTIVE    Comment: Performed at [facility], [place] [address] [place], [place] [address]  TSH     Status: None   Collection Time: 07/27/20  8:47 PM  Result Value Ref Range   TSH 0.887 0.350 - 4.500 uIU/mL    Comment: Performed by a 3rd Generation assay with a functional sensitivity of <=0.01 uIU/mL. Performed at [facility], [place] [address] [place], [place] [address]   Protime-INR     Status: None   Collection Time: 07/27/20 10:08 PM  Result Value Ref Range   Prothrombin Time 12.8 11.4 - 15.2 seconds   INR 1.0 0.8 - 1.2    Comment: (NOTE) INR goal varies based on device and disease states. Performed at [facility], [place] [address], [place], [place] [address]   Heparin level (unfractionated)     Status: Abnormal   Collection Time: 07/28/20  2:16 AM  Result Value Ref Range   Heparin Unfractionated  0.26 (L) 0.30 - 0.70 IU/mL    Comment: (NOTE) If heparin results are below expected values, and patient dosage has  been confirmed, suggest follow up testing of antithrombin III levels. Performed at [facility], [place] [address] [place], [place] [address]   TSH     Status: None   Collection Time: 07/28/20  2:16 AM  Result Value Ref Range   TSH 0.953 0.350 - 4.500 uIU/mL    Comment: Performed by a 3rd Generation assay with a functional sensitivity of <=0.01 uIU/mL. Performed at [facility], [place] [address], [place], [place] [address]   T4, free     Status: None   Collection Time: 07/28/20  2:16 AM  Result Value Ref Range   Free T4 0.94 0.61 - 1.12 ng/dL    Comment: (NOTE) Biotin ingestion may interfere with free T4 tests. If the results are inconsistent with the TSH level, previous test results, or the clinical presentation, then consider biotin interference. If needed, order repeat testing after stopping biotin. Performed at [facility], [place] [address] [place], [place] [address]   Lipid panel     Status: None   Collection Time: 07/28/20  2:16 AM  Result Value Ref Range   Cholesterol 136 0 - 200 mg/dL  Triglycerides 48 <150 mg/dL   HDL 83 >40 mg/dL   Total CHOL/HDL Ratio 1.6 RATIO   VLDL 10 0 - 40 mg/dL   LDL Cholesterol 43 0 - 99 mg/dL    Comment:        Total Cholesterol/HDL:CHD Risk Coronary Heart Disease Risk Table                     Men   Women  1/2 Average Risk   3.4   3.3  Average Risk       5.0   4.4  2 X Average Risk   9.6   7.1  3 X Average Risk  23.4   11.0        Use the calculated Patient Ratio above and the CHD Risk Table to determine the patient's CHD Risk.        ATP III CLASSIFICATION (LDL):  <100     mg/dL   Optimal  100-129  mg/dL   Near or Above                    Optimal  130-159  mg/dL   Borderline  160-189  mg/dL   High  >190     mg/dL   Very High Performed at [facility] [address] [place], [place]  [address]   Comprehensive metabolic panel     Status: Abnormal   Collection Time: 07/28/20  2:16 AM  Result Value Ref Range   Sodium 141 135 - 145 mmol/L   Potassium 4.3 3.5 - 5.1 mmol/L   Chloride 93 (L) 98 - 111 mmol/L   CO2 28 22 - 32 mmol/L   Glucose, Bld 215 (H) 70 - 99 mg/dL    Comment: Glucose reference range applies only to samples taken after fasting for at least 8 hours.   BUN 10 8 - 23 mg/dL   Creatinine, Ser 0.92 0.44 - 1.00 mg/dL   Calcium 7.9 (L) 8.9 - 10.3 mg/dL   Total Protein 5.3 (L) 6.5 - 8.1 g/dL   Albumin 2.7 (L) 3.5 - 5.0 g/dL   AST 1,154 (H) 15 - 41 U/L   ALT 268 (H) 0 - 44 U/L   Alkaline Phosphatase 163 (H) 38 - 126 U/L   Total Bilirubin 1.8 (H) 0.3 - 1.2 mg/dL   GFR, Estimated >60 >60 mL/min    Comment: (NOTE) Calculated using the CKD-EPI Creatinine Equation (2021)    Anion gap 20 (H) 5 - 15    Comment: Performed at [facility], [place] [address] [place], [place] [address]  Magnesium     Status: Abnormal   Collection Time: 07/28/20  2:16 AM  Result Value Ref Range   Magnesium 1.5 (L) 1.7 - 2.4 mg/dL    Comment: Performed at [place] [address], [place], [place] [address]  Phosphorus     Status: None   Collection Time: 07/28/20  2:16 AM  Result Value Ref Range   Phosphorus 3.4 2.5 - 4.6 mg/dL    Comment: Performed at [facility] [address], [place] [address]  CBC     Status: Abnormal   Collection Time: 07/28/20  2:16 AM  Result Value Ref Range   WBC 7.4 4.0 - 10.5 K/uL   RBC 2.41 (L) 3.87 - 5.11 MIL/uL   Hemoglobin 7.8 (L) 12.0 - 15.0 g/dL   HCT 23.0 (L) 36.0 - 46.0 %  MCV 95.4 80.0 - 100.0 fL   MCH 32.4 26.0 - 34.0 pg   MCHC 33.9 30.0 - 36.0 g/dL   RDW 17.7 (H) 11.5 - 15.5 %   Platelets 118 (L) 150 - 400 K/uL    Comment: REPEATED TO VERIFY Immature Platelet Fraction may be clinically indicated, consider ordering this additional test [id number] CONSISTENT WITH PREVIOUS RESULT    nRBC 1.6 (H) 0.0 - 0.2 %     Comment: Performed at [place] [address], [place], [place] [address]  C-reactive protein     Status: Abnormal   Collection Time: 07/28/20  7:43 AM  Result Value Ref Range   CRP 8.1 (H) <1.0 mg/dL    Comment: Performed at [facility] [address] [place], [place] [address]  Comprehensive metabolic panel     Status: Abnormal   Collection Time: 07/28/20  7:43 AM  Result Value Ref Range   Sodium 144 135 - 145 mmol/L   Potassium 4.3 3.5 - 5.1 mmol/L   Chloride 93 (L) 98 - 111 mmol/L   CO2 34 (H) 22 - 32 mmol/L   Glucose, Bld 147 (H) 70 - 99 mg/dL    Comment: Glucose reference range applies only to samples taken after fasting for at least 8 hours.   BUN 10 8 - 23 mg/dL   Creatinine, Ser 0.76 0.44 - 1.00 mg/dL   Calcium 8.2 (L) 8.9 - 10.3 mg/dL   Total Protein 5.4 (L) 6.5 - 8.1 g/dL   Albumin 2.7 (L) 3.5 - 5.0 g/dL   AST 938 (H) 15 - 41 U/L   ALT 270 (H) 0 - 44 U/L   Alkaline Phosphatase 141 (H) 38 - 126 U/L   Total Bilirubin 1.6 (H) 0.3 - 1.2 mg/dL   GFR, Estimated >60 >60 mL/min    Comment: (NOTE) Calculated using the CKD-EPI Creatinine Equation (2021)    Anion gap 17 (H) 5 - 15    Comment: Performed at [facility], [place] [address] [place], [place] [address]  D-dimer, quantitative (not at [facility])     Status: Abnormal   Collection Time: 07/28/20  7:43 AM  Result Value Ref Range   D-Dimer, Quant 1.93 (H) 0.00 - 0.50 ug/mL-FEU    Comment: (NOTE) At the manufacturer cut-off value of 0.5 g/mL FEU, this assay has a negative predictive value of 95-100%.This assay is intended for use in conjunction with a clinical pretest probability (PTP) assessment model to exclude pulmonary embolism (PE) and deep venous thrombosis (DVT) in outpatients suspected of PE or DVT. Results should be correlated with clinical presentation. Performed at [facility], [place] [address], [place], [place] [address]   Procalcitonin - Baseline     Status: None   Collection  Time: 07/28/20  7:43 AM  Result Value Ref Range   Procalcitonin 0.61 ng/mL    Comment:        Interpretation: PCT > 0.5 ng/mL and <= 2 ng/mL: Systemic infection (sepsis) is possible, but other conditions are known to elevate PCT as well. (NOTE)       Sepsis PCT Algorithm           Lower Respiratory Tract                                      Infection PCT Algorithm    ----------------------------     ----------------------------  PCT < 0.25 ng/mL                PCT < 0.10 ng/mL          Strongly encourage             Strongly discourage   discontinuation of antibiotics    initiation of antibiotics    ----------------------------     -----------------------------       PCT 0.25 - 0.50 ng/mL            PCT 0.10 - 0.25 ng/mL               OR       >80% decrease in PCT            Discourage initiation of                                            antibiotics      Encourage discontinuation           of antibiotics    ----------------------------     -----------------------------         PCT >= 0.50 ng/mL              PCT 0.26 - 0.50 ng/mL                AND       <80% decrease in PCT             Encourage initiation of                                             antibiotics       Encourage continuation           of antibiotics    ----------------------------     -----------------------------        PCT >= 0.50 ng/mL                  PCT > 0.50 ng/mL               AND         increase in PCT                  Strongly encourage                                      initiation of antibiotics    Strongly encourage escalation           of antibiotics                                     -----------------------------                                           PCT <= 0.25 ng/mL  OR                                        > 80% decrease in PCT                                      Discontinue / Do not initiate                                              antibiotics  Performed at [facility], [place] [address] [place], [place] [address]   Folate     Status: None   Collection Time: 07/28/20  7:43 AM  Result Value Ref Range   Folate 10.9 >5.9 ng/mL    Comment: Performed at [place] [address], [place], [place] [address]  Reticulocytes     Status: Abnormal   Collection Time: 07/28/20  7:43 AM  Result Value Ref Range   Retic Ct Pct 2.3 0.4 - 3.1 %   RBC. 2.56 (L) 3.87 - 5.11 MIL/uL   Retic Count, Absolute 59.4 19.0 - 186.0 K/uL   Immature Retic Fract 22.7 (H) 2.3 - 15.9 %    Comment: Performed at [facility] [address] [place], [place] [address]  Vitamin B12     Status: Abnormal   Collection Time: 07/28/20  7:43 AM  Result Value Ref Range   Vitamin B-12 1,367 (H) 180 - 914 pg/mL    Comment: (NOTE) This assay is not validated for testing neonatal or myeloproliferative syndrome specimens for Vitamin B12 levels. Performed at [facility], [place] [address] [place], [place] [address]   Iron and TIBC     Status: Abnormal   Collection Time: 07/28/20  7:43 AM  Result Value Ref Range   Iron 52 28 - 170 ug/dL   TIBC 129 (L) 250 - 450 ug/dL   Saturation Ratios 40 (H) 10.4 - 31.8 %   UIBC 77 ug/dL    Comment: Performed at [facility], [place] [address], [place], [place] [address]  Ferritin     Status: Abnormal   Collection Time: 07/28/20  7:43 AM  Result Value Ref Range   Ferritin 2,074 (H) 11 - 307 ng/mL    Comment: Performed at [facility], [place] [address], [place], [place] [address]    CT ABDOMEN PELVIS W CONTRAST  Result Date: 07/27/2020 CLINICAL DATA:  Elevated liver function studies. EXAM: CT ABDOMEN AND PELVIS WITH CONTRAST TECHNIQUE: Multidetector CT imaging of the abdomen and pelvis was performed using the standard protocol following bolus administration of intravenous contrast. CONTRAST:  60m OMNIPAQUE IOHEXOL 300 MG/ML  SOLN COMPARISON:  None. FINDINGS: Lower  chest: Moderate breathing motion artifact. There are emphysematous changes and pulmonary scarring but no definite infiltrates or effusions. The heart is within normal limits in size. Moderate aortic calcifications. Hepatobiliary: Diffuse and severe fatty infiltration of the liver with focal areas of more pronounced fatty change. No worrisome hepatic lesions or intrahepatic biliary dilatation. The gallbladder is unremarkable. No common bile duct dilatation. Pancreas: Advanced pancreatic atrophy but no mass, inflammation or ductal dilatation. Spleen: Normal size.  No focal lesions. Adrenals/Urinary  Tract: Mild nodularity of both adrenal glands but no worrisome lesions. Scattered low-attenuation renal lesions are likely benign cysts. No worrisome renal lesions or hydronephrosis. 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 The bladder is unremarkable. Stomach/Bowel: The stomach, duodenum, small bowel and colon are grossly normal. Vascular/Lymphatic: Advanced atherosclerotic calcifications involving the aorta and branch vessels but no aneurysm. The major venous structures are patent. No mesenteric or retroperitoneal mass or adenopathy. Small scattered lymph nodes are noted. Reproductive: The uterus and ovaries are unremarkable. Other: No pelvic mass or adenopathy. No free pelvic fluid collections. No inguinal mass or adenopathy. No abdominal wall hernia or subcutaneous lesions. Musculoskeletal: No significant bony findings. IMPRESSION: 1. Diffuse and severe fatty infiltration of the liver. 2. No acute abdominal/pelvic findings, mass lesions or adenopathy. 3. Advanced atherosclerotic calcifications involving the aorta and branch vessels. 4. 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 5. Emphysematous changes and pulmonary scarring at the lung bases. 6. Emphysema and aortic atherosclerosis. Aortic Atherosclerosis (ICD10-I70.0) and Emphysema (ICD10-J43.9).  Electronically Signed   By: Marijo Sanes M.D.   On: 07/27/2020 19:46   DG Chest Portable 1 View  Result Date: 07/27/2020 CLINICAL DATA:  SOB EXAM: PORTABLE CHEST 1 VIEW COMPARISON:  01/16/2020 and prior. FINDINGS: Emphysematous changes. Patchy right predominant basilar opacities. No pneumothorax or pleural effusion. Cardiomediastinal silhouette within normal limits. IMPRESSION: Right predominant basilar opacities, atelectasis versus infiltrate. Emphysema. Electronically Signed   By: Primitivo Gauze M.D.   On: 07/27/2020 15:25    Review of Systems  Constitutional: Positive for fatigue. Negative for chills, diaphoresis and fever.  HENT: Negative for sore throat.   Respiratory: Positive for cough and shortness of breath.   Cardiovascular: Positive for chest pain and palpitations.  Gastrointestinal: Negative for abdominal pain.  Genitourinary: Negative for hematuria.  Neurological: Negative for dizziness.   Blood pressure 114/78, pulse 84, temperature 97.9 F (36.6 C), temperature source Oral, resp. rate 16, height _0  (1.575 m), weight 49 kg, SpO2 99 %. Physical Exam Constitutional:      Appearance: She is ill-appearing.  HENT:     Head: Normocephalic.  Eyes:     Extraocular Movements: Extraocular movements intact.     Pupils: Pupils are equal, round, and reactive to light.  Neck:     Vascular: No JVD.  Cardiovascular:     Rate and Rhythm: Normal rate and regular rhythm.     Heart sounds: Murmur (2/6 systolic murmur noted.  No S3 gallop) heard.    Pulmonary:     Effort: Pulmonary effort is normal.     Breath sounds: Examination of the right-lower field reveals decreased breath sounds. Examination of the left-lower field reveals decreased breath sounds. Decreased breath sounds and rhonchi (occasional rhonchi noted) present.  Skin:    Findings: Ecchymosis present.  Neurological:     General: No focal deficit present.     Mental Status: She is alert and oriented to person,  place, and time.     Assessment/Plan: Noncardiac chest pain. Status post A. Fib with RVR chads score of 4. Minimally elevated high sensitivity troponin I is secondary to above.  Doubt significant MI. Hypertension. Elevated blood sugar, rule out diabetes mellitus. Exacerbation of COPD Acute on chronic hypoxic respiratory failure Positive:covid 19 rapid antigen test. Acute on chronic anemia of chronic disease. History of fall. Tobacco abuse EtOH abuse. Protein calorie malnutrition. Chronic venous insufficiency. Eczematous dermatosis. Elevated LFTs Plan Agree with switching IV Cardizem to by mouth  Anemia workup as per primary team. Patient very high risk of bleeding in view of chronic anemia , EtOH abuse and history of recurrent falls. for Chronic anticoagulation. Schedule for 2-D echo as an outpatient in a few weeks once recovers from covid 19. Discussed with patient regarding compliance with medication: alcohol cessation: and follow-up Consider skilled nursing facility  Charolette Forward 07/28/2020, 11:41 AM

## 2020-07-28 NOTE — Progress Notes (Signed)
PT Cancellation Note  Patient Details Name: [name] MRN: [id number] DOB: [birth year]   Cancelled Treatment:    Reason Eval/Treat Not Completed: Medical issues which prohibited therapy per RN, patient not medically ready for PT evaluation just yet. Will attempt to check back later if time/schedule allow.    Windell Norfolk, DPT, PN1   Supplemental Physical Therapist [facility]    Pager [phone] Acute Rehab Office [phone]

## 2020-07-28 NOTE — Progress Notes (Signed)
PROGRESS NOTE                                                                                                                                                                                                             Patient Demographics:    [name], is a 75 y.o. female, DOB - Aug 16, 1945, [id number]  Outpatient Primary MD for the patient is Christain Sacramento, MD   Admit date - 07/27/2020   LOS - 1  Chief Complaint  Patient presents with  . Shortness of Breath  . Chest Pain    Started two weeks ago with progression to severe difficulty breathing and "bad cp"       Brief Narrative: Patient is a 75 y.o. female with PMHx of EtOH use, anemia of chronic disease, eczematous dermatitis, COPD on 3 L of oxygen at home, pulmonary hypertension-who presented with worsening cough-shortness of breath-found to have A. fib with RVR.  Started on Cardizem and heparin infusion-subsequently admitted to the hospitalist service.  COVID-19 vaccinated status: Vaccinated but not boosted  Significant Events: 2/12>> Admit to [facility] for COPD exacerbation-A. fib RVR-COVID-19 positive  Significant studies: 2/11>>Chest x-ray: Right> left basilar opacities 2/11>> CT abdomen/pelvis: No acute abdominal/pelvic findings, diffuse/severe fatty infiltration of liver  COVID-19 medications: Steroids: 2/12>>  Antibiotics: Doxycycline: 2/12>>  Microbiology data: None  Procedures: None  Consults: Cardiology  DVT prophylaxis: SCDs Start: 07/27/20 2311    Subjective:    [name] today appears very frail-still complains of shortness of breath-is on 2-2 L of oxygen this morning.  She converted to sinus rhythm post admission.   Assessment  & Plan :   COPD exacerbation: Probably triggered by COVID-19 infection-stop Decadron-start IV Solu-Medrol and doxycycline, scheduled Xopenex-add Incruse inhaler.  She is stable-moving air-appears  comfortable.  Back on her usual 3 L of oxygen (home regimen)  ?COVID-19 PNA: Possible pneumonic changes in x-ray chest-CRP elevated-however LFTs are significantly elevated.  Will treat with steroids-avoid Remdesivir due to significantly elevated LFTs.  Hypoxemia is at baseline-on usual 3 L of oxygen-no role for immunomodulators at this time.  Recent Labs    07/28/20 0743  DDIMER 1.93*  FERRITIN 2,074*  CRP 8.1*    Lab Results  Component Value Date   SARSCOV2NAA POSITIVE (A) 07/27/2020   [place] NEGATIVE 01/16/2020   [place] NEGATIVE 08/22/2019   SARSCOV2NAA  NEGATIVE 08/18/2019    PAF with RVR: Back in sinus rhythm-stop Cardizem infusion-starting short acting Cardizem-on IV heparin currently.  She is very frail-lives alone-alcohol use-has chronic normocytic anemia-I agree with cardiology that she is a poor long-term candidate for anticoagulation.  Probably better to stick with aspirin.  Await echo-stop heparin GTT.  Acute alcoholic hepatitis: LFTs significantly elevated-but seems to be slowly downtrending-discriminant function only 5.  No role for steroids-CT abdomen with significant fatty infiltration.  Supportive care-avoid hepatotoxic medications.  Follow LFTs  EtOH withdrawal: Tremulous-but awake/alert.  Claims that she drinks at least one 12 ounce beer-along with 3-4 shots of liquor on a daily basis.  Last drink was 2/11.  Continue Ativan per CIWA protocol.  Normocytic anemia: Await anemia panel-FOBT stools-watch closely.  History of pulmonary hypertension: Due to COPD-awaiting echo.  Deconditioning/debility/frailty: Await PT/OT eval.  Depression: Hold bupropion-allow LFTs to improve further before initiating.  Eczematous dermatitis-keratotic lesions in left breast/bilateral lower extremities  Obesity: Estimated body mass index is 19.75 kg/m as calculated from the following:   Height as of this encounter: _0  (1.575 m).   Weight as of this encounter: 49 kg.    RN pressure injury documentation: Pressure Injury 08/19/19 Sacrum Mid Stage 2 -  Partial thickness loss of dermis presenting as a shallow open injury with a red, pink wound bed without slough. (Active)  08/19/19 0200  Location: Sacrum  Location Orientation: Mid  Staging: Stage 2 -  Partial thickness loss of dermis presenting as a shallow open injury with a red, pink wound bed without slough.  Wound Description (Comments):   Present on Admission: Yes    GI prophylaxis: PPI  ABG:    Component Value Date/Time   PHART 7.421 05/09/2018 0055   PCO2ART 48.8 (H) 05/09/2018 0055   PO2ART 127 (H) 05/09/2018 0055   HCO3 31.2 (H) 05/09/2018 0055   O2SAT 98.4 05/09/2018 0055    Vent Settings: N/A FiO2 (%):  [30 %-100 %] 30 %  Condition - Stable  Family Communication  :  Daughter-Donna-[phone] updated over the phone 2/12-agrees with avoiding anticoagulation given alcohol use/safety-understands embolic/CVA risk.  Code Status :  Full Code  Diet :  Diet Order            Diet Heart Room service appropriate? No; Fluid consistency: Thin  Diet effective now                  Disposition Plan  :   Status is: Inpatient  Remains inpatient appropriate because:Inpatient level of care appropriate due to severity of illness   Dispo: The patient is from: Home              Anticipated d/c is to: SNF              Anticipated d/c date is: > 3 days              Patient currently is not medically stable to d/c.   Difficult to place patient No  Barriers to discharge: COPD exacerbation on IV steroids, COVID-19 infection, alcoholic hepatitis-needs continued inpatient monitoring/treatment.  Antimicorbials  :    Anti-infectives (From admission, onward)   Start     Dose/Rate Route Frequency Ordered Stop   07/28/20 1000  doxycycline (VIBRA-TABS) tablet 100 mg        100 mg Oral Every 12 hours 07/28/20 0813        Inpatient Medications  Scheduled Meds: . diltiazem  60 mg Oral Q6H  .  doxycycline  100 mg Oral Q12H  . feeding supplement  237 mL Oral BID BM  . folic acid  1 mg Oral Daily  . levalbuterol  2 puff Inhalation Q6H  . mouth rinse  15 mL Mouth Rinse BID  . methylPREDNISolone (SOLU-MEDROL) injection  40 mg Intravenous BID  . multivitamin with minerals  1 tablet Oral Daily  . thiamine  100 mg Oral Daily   Or  . thiamine  100 mg Intravenous Daily  . umeclidinium bromide  1 puff Inhalation Daily   Continuous Infusions: . diltiazem (CARDIZEM) infusion Stopped (07/28/20 1140)  . heparin 800 Units/hr (07/28/20 0754)   PRN Meds:.acetaminophen, alum & mag hydroxide-simeth, LORazepam **OR** LORazepam, morphine injection, ondansetron (ZOFRAN) IV   Time Spent in minutes  25  See all Orders from today for further details   Oren Binet M.D on 07/28/2020 at 1:40 PM  To page go to [email or web address] - use universal password  [facility] -  Office  [phone]    Objective:   Vitals:   07/28/20 0941 07/28/20 1000 07/28/20 1043 07/28/20 1136  BP: 137/64  (!) 142/69 114/78  Pulse: 94 97 95 84  Resp:   17 16  Temp:    97.9 F (36.6 C)  TempSrc:    Oral  SpO2: 100%  98% 99%  Weight:      Height:        Wt Readings from Last 3 Encounters:  07/27/20 49 kg  06/22/20 51.5 kg  02/13/20 50.2 kg     Intake/Output Summary (Last 24 hours) at 07/28/2020 1340 Last data filed at 07/28/2020 0754 Gross per 24 hour  Intake 1338.87 ml  Output 1700 ml  Net -361.13 ml     Physical Exam Gen Exam:Alert awake-not in any distress HEENT:atraumatic, normocephalic Chest: B/L clear to auscultation anteriorly CVS:S1S2 regular Abdomen:soft non tender, non distended Extremities:no edema Neurology: Non focal   Data Review:    CBC Recent Labs  Lab 07/27/20 1515 07/28/20 0216  WBC 8.2 7.4  HGB 8.6* 7.8*  HCT 28.1* 23.0*  PLT 119* 118*  MCV 101.8* 95.4  MCH 31.2 32.4  MCHC 30.6 33.9  RDW 18.2* 17.7*  LYMPHSABS 1.9  --   MONOABS 1.6*  --   EOSABS 0.0   --   BASOSABS 0.1  --     Chemistries  Recent Labs  Lab 07/27/20 1515 07/28/20 0216 07/28/20 0743  NA 143 141 144  K 4.4 4.3 4.3  CL 97* 93* 93*  CO2 24 28 34*  GLUCOSE 106* 215* 147*  BUN _0 CREATININE 0.74 0.92 0.76  CALCIUM 8.4* 7.9* 8.2*  MG  --  1.5*  --   AST 614* 1,154* 938*  ALT 166* 268* 270*  ALKPHOS 166* 163* 141*  BILITOT 1.6* 1.8* 1.6*   ------------------------------------------------------------------------------------------------------------------ Recent Labs    07/28/20 0216  CHOL 136  HDL 83  LDLCALC 43  TRIG 48  CHOLHDL 1.6    Lab Results  Component Value Date   HGBA1C 6.1 (H) 05/13/2018   ------------------------------------------------------------------------------------------------------------------ Recent Labs    07/28/20 0216  TSH 0.953   ------------------------------------------------------------------------------------------------------------------ Recent Labs    07/28/20 0743  VITAMINB12 1,367*  FOLATE 10.9  FERRITIN 2,074*  TIBC 129*  IRON 52  RETICCTPCT 2.3    Coagulation profile Recent Labs  Lab 07/27/20 2208  INR 1.0    Recent Labs    07/28/20 0743  DDIMER 1.93*    Cardiac Enzymes No results for input(s):  CKMB, TROPONINI, MYOGLOBIN in the last 168 hours.  Invalid input(s): CK ------------------------------------------------------------------------------------------------------------------    Component Value Date/Time   BNP 104.4 (H) 07/27/2020 1515    Micro Results Recent Results (from the past 240 hour(s))  Resp Panel by RT-PCR (Flu A&B, Covid) Nasopharyngeal Swab     Status: Abnormal   Collection Time: 07/27/20  3:28 PM   Specimen: Nasopharyngeal Swab; Nasopharyngeal(NP) swabs in vial transport medium  Result Value Ref Range Status   SARS Coronavirus 2 by RT PCR POSITIVE (A) NEGATIVE Final    Comment: RESULT CALLED TO, READ BACK BY AND VERIFIED WITH: Wyvonna Plum RN [id number] 07/27/20 A  BROWNING (NOTE) SARS-CoV-2 target nucleic acids are DETECTED.  The SARS-CoV-2 RNA is generally detectable in upper respiratory specimens during the acute phase of infection. Positive results are indicative of the presence of the identified virus, but do not rule out bacterial infection or co-infection with other pathogens not detected by the test. Clinical correlation with patient history and other diagnostic information is necessary to determine patient infection status. The expected result is Negative.  Fact Sheet for Patients: EntrepreneurPulse.com.au  Fact Sheet for Healthcare Providers: IncredibleEmployment.be  This test is not yet approved or cleared by the [place] FDA and  has been authorized for detection and/or diagnosis of SARS-CoV-2 by FDA under an Emergency Use Authorization (EUA).  This EUA will remain in effect (meaning this test can  be used) for the duration of  the COVID-19 declaration under Section 564(b)(1) of the Act, 21 U.S.C. section 360bbb-3(b)(1), unless the authorization is terminated or revoked sooner.     Influenza A by PCR NEGATIVE NEGATIVE Final   Influenza B by PCR NEGATIVE NEGATIVE Final    Comment: (NOTE) The Xpert Xpress SARS-CoV-2/FLU/RSV plus assay is intended as an aid in the diagnosis of influenza from Nasopharyngeal swab specimens and should not be used as a sole basis for treatment. Nasal washings and aspirates are unacceptable for Xpert Xpress SARS-CoV-2/FLU/RSV testing.  Fact Sheet for Patients: EntrepreneurPulse.com.au  Fact Sheet for Healthcare Providers: IncredibleEmployment.be  This test is not yet approved or cleared by the [place] FDA and has been authorized for detection and/or diagnosis of SARS-CoV-2 by FDA under an Emergency Use Authorization (EUA). This EUA will remain in effect (meaning this test can be used) for the duration of  the COVID-19 declaration under Section 564(b)(1) of the Act, 21 U.S.C. section 360bbb-3(b)(1), unless the authorization is terminated or revoked.  Performed at [facility], [place] [address] [place], [place] [address]     Radiology Reports CT ABDOMEN PELVIS W CONTRAST  Result Date: 07/27/2020 CLINICAL DATA:  Elevated liver function studies. EXAM: CT ABDOMEN AND PELVIS WITH CONTRAST TECHNIQUE: Multidetector CT imaging of the abdomen and pelvis was performed using the standard protocol following bolus administration of intravenous contrast. CONTRAST:  56m OMNIPAQUE IOHEXOL 300 MG/ML  SOLN COMPARISON:  None. FINDINGS: Lower chest: Moderate breathing motion artifact. There are emphysematous changes and pulmonary scarring but no definite infiltrates or effusions. The heart is within normal limits in size. Moderate aortic calcifications. Hepatobiliary: Diffuse and severe fatty infiltration of the liver with focal areas of more pronounced fatty change. No worrisome hepatic lesions or intrahepatic biliary dilatation. The gallbladder is unremarkable. No common bile duct dilatation. Pancreas: Advanced pancreatic atrophy but no mass, inflammation or ductal dilatation. Spleen: Normal size.  No focal lesions. Adrenals/Urinary Tract: Mild nodularity of both adrenal glands but no worrisome lesions. Scattered low-attenuation renal lesions are likely benign cysts. No  worrisome renal lesions or hydronephrosis. 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 The bladder is unremarkable. Stomach/Bowel: The stomach, duodenum, small bowel and colon are grossly normal. Vascular/Lymphatic: Advanced atherosclerotic calcifications involving the aorta and branch vessels but no aneurysm. The major venous structures are patent. No mesenteric or retroperitoneal mass or adenopathy. Small scattered lymph nodes are noted. Reproductive: The uterus and ovaries are unremarkable. Other: No pelvic mass or  adenopathy. No free pelvic fluid collections. No inguinal mass or adenopathy. No abdominal wall hernia or subcutaneous lesions. Musculoskeletal: No significant bony findings. IMPRESSION: 1. Diffuse and severe fatty infiltration of the liver. 2. No acute abdominal/pelvic findings, mass lesions or adenopathy. 3. Advanced atherosclerotic calcifications involving the aorta and branch vessels. 4. 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 5. Emphysematous changes and pulmonary scarring at the lung bases. 6. Emphysema and aortic atherosclerosis. Aortic Atherosclerosis (ICD10-I70.0) and Emphysema (ICD10-J43.9). Electronically Signed   By: Marijo Sanes M.D.   On: 07/27/2020 19:46   DG Chest Portable 1 View  Result Date: 07/27/2020 CLINICAL DATA:  SOB EXAM: PORTABLE CHEST 1 VIEW COMPARISON:  01/16/2020 and prior. FINDINGS: Emphysematous changes. Patchy right predominant basilar opacities. No pneumothorax or pleural effusion. Cardiomediastinal silhouette within normal limits. IMPRESSION: Right predominant basilar opacities, atelectasis versus infiltrate. Emphysema. Electronically Signed   By: Primitivo Gauze M.D.   On: 07/27/2020 15:25

## 2020-07-29 DIAGNOSIS — I4891 Unspecified atrial fibrillation: Secondary | ICD-10-CM | POA: Diagnosis not present

## 2020-07-29 LAB — C-REACTIVE PROTEIN: CRP: 4.9 mg/dL — ABNORMAL HIGH (ref <1.0)

## 2020-07-29 LAB — COMPREHENSIVE METABOLIC PANEL
ALT: 236 U/L — ABNORMAL HIGH (ref 0–44)
AST: 467 U/L — ABNORMAL HIGH (ref 15–41)
Albumin: 2.6 g/dL — ABNORMAL LOW (ref 3.5–5.0)
Alkaline Phosphatase: 137 U/L — ABNORMAL HIGH (ref 38–126)
Anion gap: 12 (ref 5–15)
BUN: 13 mg/dL (ref 8–23)
CO2: 35 mmol/L — ABNORMAL HIGH (ref 22–32)
Calcium: 8.5 mg/dL — ABNORMAL LOW (ref 8.9–10.3)
Chloride: 93 mmol/L — ABNORMAL LOW (ref 98–111)
Creatinine, Ser: 0.55 mg/dL (ref 0.44–1.00)
GFR, Estimated: >60 mL/min (ref >60)
Glucose, Bld: 219 mg/dL — ABNORMAL HIGH (ref 70–99)
Potassium: 4 mmol/L (ref 3.5–5.1)
Sodium: 140 mmol/L (ref 135–145)
Total Bilirubin: 0.8 mg/dL (ref 0.3–1.2)
Total Protein: 4.9 g/dL — ABNORMAL LOW (ref 6.5–8.1)

## 2020-07-29 LAB — CBC
HCT: 21.9 % — ABNORMAL LOW (ref 36.0–46.0)
Hemoglobin: 7.6 g/dL — ABNORMAL LOW (ref 12.0–15.0)
MCH: 32.8 pg (ref 26.0–34.0)
MCHC: 34.7 g/dL (ref 30.0–36.0)
MCV: 94.4 fL (ref 80.0–100.0)
Platelets: 121 10*3/uL — ABNORMAL LOW (ref 150–400)
RBC: 2.32 MIL/uL — ABNORMAL LOW (ref 3.87–5.11)
RDW: 17.7 % — ABNORMAL HIGH (ref 11.5–15.5)
WBC: 8 10*3/uL (ref 4.0–10.5)
nRBC: 3.7 % — ABNORMAL HIGH (ref 0.0–0.2)

## 2020-07-29 LAB — BRAIN NATRIURETIC PEPTIDE: B Natriuretic Peptide: 530.6 pg/mL — ABNORMAL HIGH (ref 0.0–100.0)

## 2020-07-29 LAB — MAGNESIUM: Magnesium: 2.3 mg/dL (ref 1.7–2.4)

## 2020-07-29 LAB — PROTIME-INR
INR: 0.9 (ref 0.8–1.2)
Prothrombin Time: 12.2 seconds (ref 11.4–15.2)

## 2020-07-29 LAB — D-DIMER, QUANTITATIVE: D-Dimer, Quant: 3.01 ug/mL-FEU — ABNORMAL HIGH (ref 0.00–0.50)

## 2020-07-29 LAB — HEPARIN LEVEL (UNFRACTIONATED): Heparin Unfractionated: <0.1 IU/mL — ABNORMAL LOW (ref 0.30–0.70)

## 2020-07-29 MED ORDER — ENOXAPARIN SODIUM 40 MG/0.4ML ~~LOC~~ SOLN
40.0000 mg | SUBCUTANEOUS | Status: DC
  Administered 2020-07-29 – 2020-08-07 (×10): 40 mg via SUBCUTANEOUS
  Filled 2020-07-29 (×10): qty 0.4

## 2020-07-29 MED ORDER — LOSARTAN POTASSIUM 50 MG PO TABS
25.0000 mg | ORAL_TABLET | Freq: Every day | ORAL | Status: DC
  Administered 2020-07-29 – 2020-08-01 (×4): 25 mg via ORAL
  Filled 2020-07-29 (×4): qty 1

## 2020-07-29 NOTE — Care Management (Signed)
07-29-20 Case Manager received a consult for Eliquis. 30 day free card provided to the patient. Bethena Roys, RN,BSN Case Manager

## 2020-07-29 NOTE — Plan of Care (Signed)

## 2020-07-29 NOTE — Evaluation (Signed)
Physical Therapy Evaluation Patient Details Name: [name] MRN: [id number] DOB: 04-13-1946 Today's Date: 07/29/2020   History of Present Illness  Patient is a 75 y.o. female with PMHx of EtOH use, anemia of chronic disease, eczematous dermatitis, COPD on 3 L of oxygen at home, pulmonary hypertension-who presented with worsening cough-shortness of breath-found to have A. fib with RVR and tested positive for COVID-19.  Clinical Impression  Prior to admission, pt lives alone and is typically independent. Pt presents with decreased cardiopulmonary endurance, gross weakness, and poor balance. Pt requiring two person moderate assist for stand pivot transfers. Unable to ambulate currently. Desaturation to 81% on 3L O2 during mobility (poor waveform), requiring increase to 6L O2, where she rebounded > 90%. HR 100-131 bpm. Due to deficits and decreased caregiver support, recommending SNF at discharge to maximize functional mobility.    Follow Up Recommendations SNF    Equipment Recommendations  None recommended by PT    Recommendations for Other Services       Precautions / Restrictions Precautions Precautions: Fall Restrictions Weight Bearing Restrictions: No      Mobility  Bed Mobility Overal bed mobility: Needs Assistance Bed Mobility: Sit to Supine       Sit to supine: Min assist   General bed mobility comments: minA for BLE management with return to supine    Transfers Overall transfer level: Needs assistance Equipment used: Rolling walker (2 wheeled) Transfers: Sit to/from [facility] Sit to Stand: Mod assist;+2 safety/equipment Stand pivot transfers: Mod assist;+2 safety/equipment;+2 physical assistance       General transfer comment: modA to powerup into standing, vc for safe hand placement. ModA+2 for stand pivot x3 from recliner>BSC>recliner>bed.  Ambulation/Gait                Stairs            Wheelchair Mobility     Modified Rankin (Stroke Patients Only)       Balance Overall balance assessment: Needs assistance Sitting-balance support: No upper extremity supported;Feet supported Sitting balance-Leahy Scale: Fair Sitting balance - Comments: able to sit unsupported without LOB   Standing balance support: Bilateral upper extremity supported;During functional activity Standing balance-Leahy Scale: Poor Standing balance comment: heavy reliance on BUE support                             Pertinent Vitals/Pain Pain Assessment: 0-10 Pain Score: 10-Worst pain ever Pain Location: chest pain with coughing Pain Descriptors / Indicators: Sore Pain Intervention(s): Monitored during session    Home Living Family/patient expects to be discharged to:: Private residence Living Arrangements: Alone Available Help at Discharge: Friend(s) Type of Home: House Home Access: Stairs to enter   [employer] of Steps: 1 Home Layout: One level Home Equipment: Environmental consultant - 2 wheels;Cane - single point;Shower seat - built in;Bedside commode Additional Comments: O2 _0     Prior Function Level of Independence: Independent         Comments: Pt states unable to get up on feet just prior to admit     Hand Dominance   Dominant Hand: Right    Extremity/Trunk Assessment   Upper Extremity Assessment Upper Extremity Assessment: Generalized weakness    Lower Extremity Assessment Lower Extremity Assessment: Generalized weakness    Cervical / Trunk Assessment Cervical / Trunk Assessment: Kyphotic  Communication   Communication: No difficulties  Cognition Arousal/Alertness: Awake/alert Behavior During Therapy: WFL for tasks assessed/performed Overall Cognitive Status: No  family/caregiver present to determine baseline cognitive functioning                                 General Comments: pt required increased time and cues for processing information. not formally assessed  this date      General Comments General comments (skin integrity, edema, etc.): SpO2 81% on 4lnc required 6lnc and pursed lip breathing to return to >90%. Pt desated with exertion. HR max 131bpm with exertion;cues for pursed lip breathing    Exercises General Exercises - Lower Extremity Long Arc Quad: AAROM;Both;5 reps;Seated   Assessment/Plan    PT Assessment Patient needs continued PT services  PT Problem List Decreased strength;Decreased activity tolerance;Decreased balance;Decreased mobility;Decreased cognition;Cardiopulmonary status limiting activity       PT Treatment Interventions DME instruction;Gait training;Functional mobility training;Therapeutic activities;Therapeutic exercise;Balance training;Patient/family education    PT Goals (Current goals can be found in the Care Plan section)  Acute Rehab PT Goals Patient Stated Goal: to get better PT Goal Formulation: With patient Time For Goal Achievement: 08/12/20 Potential to Achieve Goals: Good    Frequency Min 3X/week   Barriers to discharge        Co-evaluation PT/OT/SLP Co-Evaluation/Treatment: Yes Reason for Co-Treatment: Complexity of the patient's impairments (multi-system involvement);To address functional/ADL transfers;For patient/therapist safety PT goals addressed during session: Mobility/safety with mobility OT goals addressed during session: ADL's and self-care       AM-PAC PT "6 Clicks" Mobility  Outcome Measure Help needed turning from your back to your side while in a flat bed without using bedrails?: None Help needed moving from lying on your back to sitting on the side of a flat bed without using bedrails?: A Little Help needed moving to and from a bed to a chair (including a wheelchair)?: A Lot Help needed standing up from a chair using your arms (e.g., wheelchair or bedside chair)?: A Lot Help needed to walk in hospital room?: A Lot Help needed climbing 3-5 steps with a railing? : Total 6  Click Score: 14    End of Session Equipment Utilized During Treatment: Gait belt;Oxygen Activity Tolerance: Patient limited by fatigue Patient left: with call bell/phone within reach;in bed;with bed alarm set Nurse Communication: Mobility status PT Visit Diagnosis: Unsteadiness on feet (R26.81);Muscle weakness (generalized) (M62.81);Difficulty in walking, not elsewhere classified (R26.2)    Time: 2233-6122 PT Time Calculation (min) (ACUTE ONLY): 47 min   Charges:   PT Evaluation $PT Eval Moderate Complexity: 1 Mod PT Treatments $Therapeutic Activity: 8-22 mins        Wyona Almas, PT, DPT Acute Rehabilitation Services Pager [phone] Office [phone]   Deno Etienne 07/29/2020, 4:46 PM

## 2020-07-29 NOTE — Progress Notes (Signed)
Subjective:  Patient denies any chest pain or shortness of breath states overall feels better up in chair  Objective:  Vital Signs in the last 24 hours: Temp:  [97.9 F (36.6 C)-98.5 F (36.9 C)] 98.4 F (36.9 C) (02/13 0344) Pulse Rate:  [84-103] 103 (02/13 1000) Resp:  [16-22] 22 (02/13 0741) BP: (112-136)/(53-102) 136/77 (02/13 1000) SpO2:  [96 %-100 %] 100 % (02/13 0741) Weight:  [47.7 kg] 47.7 kg (02/13 0946)  Intake/Output from previous day: 02/12 0701 - 02/13 0700 In: 965.5 [P.O.:580; I.V.:385.5] Out: 825 [Urine:825] Intake/Output from this shift: Total I/O In: 240 [P.O.:240] Out: -   Physical Exam: Neck: no adenopathy, no carotid bruit, no JVD and supple, symmetrical, trachea midline Lungs: Decreased breath sound at bases with occasional rhonchi Heart: regular rate and rhythm, S1, S2 normal and 2/6 systolic murmur noted Abdomen: soft, non-tender; bowel sounds normal; no masses,  no organomegaly Extremities: No clubbing cyanosis edema chronic bilateral eczematous dermatosis noted  Lab Results: Recent Labs    07/28/20 0216 07/29/20 0305  WBC 7.4 8.0  HGB 7.8* 7.6*  PLT 118* 121*   Recent Labs    07/28/20 0743 07/29/20 0305  NA 144 140  K 4.3 4.0  CL 93* 93*  CO2 34* 35*  GLUCOSE 147* 219*  BUN 10 13  CREATININE 0.76 0.55   No results for input(s): TROPONINI in the last 72 hours.  Invalid input(s): CK, MB Hepatic Function Panel Recent Labs    07/29/20 0305  PROT 4.9*  ALBUMIN 2.6*  AST 467*  ALT 236*  ALKPHOS 137*  BILITOT 0.8   Recent Labs    07/28/20 0216  CHOL 136   No results for input(s): PROTIME in the last 72 hours.  Imaging: Imaging results have been reviewed and CT ABDOMEN PELVIS W CONTRAST  Result Date: 07/27/2020 CLINICAL DATA:  Elevated liver function studies. EXAM: CT ABDOMEN AND PELVIS WITH CONTRAST TECHNIQUE: Multidetector CT imaging of the abdomen and pelvis was performed using the standard protocol following bolus  administration of intravenous contrast. CONTRAST:  11m OMNIPAQUE IOHEXOL 300 MG/ML  SOLN COMPARISON:  None. FINDINGS: Lower chest: Moderate breathing motion artifact. There are emphysematous changes and pulmonary scarring but no definite infiltrates or effusions. The heart is within normal limits in size. Moderate aortic calcifications. Hepatobiliary: Diffuse and severe fatty infiltration of the liver with focal areas of more pronounced fatty change. No worrisome hepatic lesions or intrahepatic biliary dilatation. The gallbladder is unremarkable. No common bile duct dilatation. Pancreas: Advanced pancreatic atrophy but no mass, inflammation or ductal dilatation. Spleen: Normal size.  No focal lesions. Adrenals/Urinary Tract: Mild nodularity of both adrenal glands but no worrisome lesions. Scattered low-attenuation renal lesions are likely benign cysts. No worrisome renal lesions or hydronephrosis. 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 The bladder is unremarkable. Stomach/Bowel: The stomach, duodenum, small bowel and colon are grossly normal. Vascular/Lymphatic: Advanced atherosclerotic calcifications involving the aorta and branch vessels but no aneurysm. The major venous structures are patent. No mesenteric or retroperitoneal mass or adenopathy. Small scattered lymph nodes are noted. Reproductive: The uterus and ovaries are unremarkable. Other: No pelvic mass or adenopathy. No free pelvic fluid collections. No inguinal mass or adenopathy. No abdominal wall hernia or subcutaneous lesions. Musculoskeletal: No significant bony findings. IMPRESSION: 1. Diffuse and severe fatty infiltration of the liver. 2. No acute abdominal/pelvic findings, mass lesions or adenopathy. 3. Advanced atherosclerotic calcifications involving the aorta and branch vessels. 4. 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 5. Emphysematous changes and pulmonary scarring at the lung  bases. 6. Emphysema and aortic atherosclerosis. Aortic Atherosclerosis (ICD10-I70.0) and Emphysema (ICD10-J43.9). Electronically Signed   By: Marijo Sanes M.D.   On: 07/27/2020 19:46   DG Chest Portable 1 View  Result Date: 07/27/2020 CLINICAL DATA:  SOB EXAM: PORTABLE CHEST 1 VIEW COMPARISON:  01/16/2020 and prior. FINDINGS: Emphysematous changes. Patchy right predominant basilar opacities. No pneumothorax or pleural effusion. Cardiomediastinal silhouette within normal limits. IMPRESSION: Right predominant basilar opacities, atelectasis versus infiltrate. Emphysema. Electronically Signed   By: Primitivo Gauze M.D.   On: 07/27/2020 15:25   ECHOCARDIOGRAM LIMITED  Result Date: 07/28/2020    ECHOCARDIOGRAM LIMITED REPORT   Patient Name:   BREKYN HUNTOON [facility] Date of Exam: 07/28/2020 Medical Rec #:  [id number]       Height:       62.0 in Accession #:    [id number]      Weight:       108.0 lb Date of Birth:  12-06-1945       BSA:          1.471 m Patient Age:    75 years        BP:           114/78 mmHg Patient Gender: F               HR:           83 bpm. Exam Location:  Inpatient Procedure: Limited Echo, Limited Color Doppler and Cardiac Doppler Indications:    atrial fibrillation  History:        Patient has no prior history of Echocardiogram examinations.                 Covid and COPD; Risk Factors:Hypertension.  Sonographer:    Johny Chess Referring Phys: [place]  1. Left ventricular ejection fraction, by estimation, is 55 to 60%. The left ventricle has normal function. The left ventricle has no regional wall motion abnormalities. Left ventricular diastolic parameters were normal.  2. Right ventricular systolic function is normal. The right ventricular size is mildly enlarged.  3. The mitral valve is normal in structure. No evidence of mitral valve regurgitation.  4. The aortic valve is tricuspid. Aortic valve regurgitation is not visualized. No aortic stenosis is present.   5. The inferior vena cava is dilated in size with >50% respiratory variability, suggesting right atrial pressure of 8 mmHg. Comparison(s): No prior Echocardiogram. FINDINGS  Left Ventricle: Left ventricular ejection fraction, by estimation, is 55 to 60%. The left ventricle has normal function. The left ventricle has no regional wall motion abnormalities. The left ventricular internal cavity size was normal in size. There is  no left ventricular hypertrophy. Left ventricular diastolic parameters were normal. Right Ventricle: The right ventricular size is mildly enlarged. Right ventricular systolic function is normal. Mitral Valve: The mitral valve is normal in structure. Tricuspid Valve: The tricuspid valve is normal in structure. Tricuspid valve regurgitation is not demonstrated. Aortic Valve: The aortic valve is tricuspid. Aortic valve regurgitation is not visualized. No aortic stenosis is present. Aorta: The aortic root is normal in size and structure and the ascending aorta was not well visualized. Venous: The inferior vena cava is dilated in size with greater than 50% respiratory variability, suggesting right atrial pressure of 8 mmHg. LEFT VENTRICLE PLAX 2D LVIDd:         4.40 cm  Diastology LVIDs:  2.50 cm  LV e' medial:    10.60 cm/s LV PW:         0.80 cm  LV E/e' medial:  7.9 LV IVS:        0.70 cm  LV e' lateral:   11.50 cm/s LVOT diam:     1.90 cm  LV E/e' lateral: 7.3 LV SV:         66 LV SV Index:   45 LVOT Area:     2.84 cm  IVC IVC diam: 2.40 cm LEFT ATRIUM         Index LA diam:    3.70 cm 2.51 cm/m  AORTIC VALVE LVOT Vmax:   137.00 cm/s LVOT Vmean:  87.500 cm/s LVOT VTI:    0.233 m  AORTA Ao Root diam: 2.90 cm MITRAL VALVE MV Area (PHT): 4.10 cm    SHUNTS MV Decel Time: 185 msec    Systemic VTI:  0.23 m MV E velocity: 83.60 cm/s  Systemic Diam: 1.90 cm MV A velocity: 71.10 cm/s MV E/A ratio:  1.18 Rudean Haskell MD Electronically signed by Rudean Haskell MD Signature Date/Time:  07/28/2020/5:07:15 PM    Final     Cardiac Studies:  Assessment/Plan:  Status post noncardiac chest pain. Status post A. Fib with RVR chads score of 4. Minimally elevated high sensitivity troponin I is secondary to above.  Doubt significant MI. Hypertension. Elevated blood sugar, rule out diabetes mellitus. Exacerbation of COPD Acute on chronic hypoxic respiratory failure Positive:covid 19 rapid antigen test. Acute on chronic anemia of chronic disease. History of fall. Tobacco abuse EtOH abuse. Protein calorie malnutrition. Chronic venous insufficiency. Eczematous dermatosis. Elevated LFTs Plan Continue present management for now Increase Cardizem as blood pressure and heart rate tolerates Restart low-dose losartan I will sign off please call if needed Follow-up with me in 2 weeks   LOS: 2 days    Charolette Forward 07/29/2020, 11:03 AM

## 2020-07-29 NOTE — Evaluation (Signed)
Occupational Therapy Evaluation Patient Details Name: [name] MRN: [id number] DOB: 05-07-1946 Today's Date: 07/29/2020    History of Present Illness Patient is a 75 y.o. female with PMHx of EtOH use, anemia of chronic disease, eczematous dermatitis, COPD on 3 L of oxygen at home, pulmonary hypertension-who presented with worsening cough-shortness of breath-found to have A. fib with RVR and tested positive for COVID-19.   Clinical Impression   PTA, pt was living at home alone, pt reports she was independent with ADL/IADL and functional mobility but was unable to get onto her feet just prior to arrival. Pt currently limited by generalized weakness, cardiopulmonary limitations, decreased activity tolerance, and decreased endurance. She requires modA+2 for sit<>stand and stand-pivot with toilet transfers and maxA for posterior care. Pt desaturated with exertion, unsure true O2 status due to poor wave form, noted SpO2 81% on 4lnc with good wave form, required 6lnc with pursed lip breathing to return SpO2 >90%. HR max 131 with exertion. Due to decline in current level of function, pt would benefit from acute OT to address established goals to facilitate safe D/C to venue listed below. At this time, recommend SNF follow-up. Will continue to follow acutely.     Follow Up Recommendations  SNF;Supervision/Assistance - 24 hour    Equipment Recommendations  3 in 1 bedside commode    Recommendations for Other Services       Precautions / Restrictions Precautions Precautions: Fall;Other (comment) (air/contact precautions) Restrictions Weight Bearing Restrictions: No      Mobility Bed Mobility Overal bed mobility: Needs Assistance Bed Mobility: Sit to Supine       Sit to supine: Min assist   General bed mobility comments: minA for BLE management with return to supine    Transfers Overall transfer level: Needs assistance Equipment used: Rolling walker (2 wheeled) Transfers: Sit  to/from [facility] Sit to Stand: Mod assist;+2 safety/equipment Stand pivot transfers: Mod assist;+2 safety/equipment;+2 physical assistance       General transfer comment: modA to powerup into standing, vc for safe hand placement. ModA+2 for stand pivot x3 from recliner>BSC>recliner>bed.    Balance Overall balance assessment: Needs assistance Sitting-balance support: No upper extremity supported;Feet supported Sitting balance-Leahy Scale: Fair Sitting balance - Comments: able to sit unsupported without LOB   Standing balance support: Bilateral upper extremity supported;During functional activity Standing balance-Leahy Scale: Poor Standing balance comment: heavy reliance on BUE support                           ADL either performed or assessed with clinical judgement   ADL Overall ADL's : Needs assistance/impaired Eating/Feeding: Set up;Sitting   Grooming: Set up;Sitting   Upper Body Bathing: Set up;Sitting   Lower Body Bathing: Moderate assistance;Sit to/from stand   Upper Body Dressing : Minimal assistance;Sitting   Lower Body Dressing: Moderate assistance;Sit to/from stand   Toilet Transfer: Moderate assistance;Stand-pivot Toilet Transfer Details (indicate cue type and reason): from recliner to [place] and Hygiene: Maximal assistance;Sit to/from stand Toileting - Clothing Manipulation Details (indicate cue type and reason): maxA for posterior care with assistance from therapist for support and stability     Functional mobility during ADLs: Moderate assistance;+2 for safety/equipment General ADL Comments: pt limited by decreased activity tolerance cardiopulmonary limitations, cognition and generalized weakness/deconditioning     Vision   Additional Comments: will continue to assess     Perception     Praxis  Pertinent Vitals/Pain Pain Assessment: 0-10 Pain Score: 10-Worst pain ever Pain Location:  chest pain with coughing Pain Descriptors / Indicators: Sore Pain Intervention(s): Limited activity within patient's tolerance;Monitored during session     Hand Dominance Right   Extremity/Trunk Assessment Upper Extremity Assessment Upper Extremity Assessment: Generalized weakness   Lower Extremity Assessment Lower Extremity Assessment: Generalized weakness;Defer to PT evaluation   Cervical / Trunk Assessment Cervical / Trunk Assessment: Kyphotic   Communication Communication Communication: No difficulties   Cognition Arousal/Alertness: Awake/alert Behavior During Therapy: WFL for tasks assessed/performed Overall Cognitive Status: No family/caregiver present to determine baseline cognitive functioning                                 General Comments: pt required increased time and cues for processing information. not formally assessed this date   General Comments  SpO2 81% on 4lnc required 6lnc and pursed lip breathing to return to >90%. Pt desated with exertion. HR max 131bpm with exertion;cues for pursed lip breathing    Exercises     Shoulder Instructions      Home Living Family/patient expects to be discharged to:: Private residence Living Arrangements: Alone   Type of Home: House Home Access: Stairs to enter [employer] of Steps: 1   Home Layout: One level     Bathroom Shower/Tub: Teacher, early years/pre: Standard     Home Equipment: Environmental consultant - 2 wheels;Cane - single point;Shower seat - built in;Bedside commode   Additional Comments: O2 _0       Prior Functioning/Environment Level of Independence: Independent        Comments: Pt states unable to get up on feet just prior to admit        OT Problem List: Decreased activity tolerance;Impaired balance (sitting and/or standing);Decreased safety awareness;Cardiopulmonary status limiting activity      OT Treatment/Interventions: Self-care/ADL training;Therapeutic  exercise;Energy conservation;DME and/or AE instruction;Therapeutic activities;Patient/family education;Balance training    OT Goals(Current goals can be found in the care plan section) Acute Rehab OT Goals Patient Stated Goal: to get better OT Goal Formulation: With patient Time For Goal Achievement: 08/12/20 Potential to Achieve Goals: Good ADL Goals Pt Will Perform Grooming: with modified independence;standing Pt Will Perform Lower Body Dressing: with modified independence;sit to/from stand Pt Will Transfer to Toilet: with modified independence;ambulating Additional ADL Goal #1: Pt will demonstrate use of 3 energy conservation strategies during ADL and functional mobility.  OT Frequency: Min 2X/week   Barriers to D/C:            Co-evaluation PT/OT/SLP Co-Evaluation/Treatment: Yes Reason for Co-Treatment: Complexity of the patient's impairments (multi-system involvement);For patient/therapist safety;To address functional/ADL transfers   OT goals addressed during session: ADL's and self-care      AM-PAC OT "6 Clicks" Daily Activity     Outcome Measure Help from another person eating meals?: A Little Help from another person taking care of personal grooming?: A Little Help from another person toileting, which includes using toliet, bedpan, or urinal?: A Lot Help from another person bathing (including washing, rinsing, drying)?: A Lot Help from another person to put on and taking off regular upper body clothing?: A Little Help from another person to put on and taking off regular lower body clothing?: A Lot 6 Click Score: 15   End of Session Equipment Utilized During Treatment: Gait belt;Rolling walker;Oxygen Nurse Communication: Mobility status  Activity Tolerance: Patient tolerated treatment well Patient left: in  bed;with call bell/phone within reach;with bed alarm set  OT Visit Diagnosis: Unsteadiness on feet (R26.81);Other abnormalities of gait and mobility (R26.89);Muscle  weakness (generalized) (M62.81)                Time: 4920-1007 OT Time Calculation (min): 36 min Charges:  OT General Charges $OT Visit: 1 Visit OT Evaluation $OT Eval Moderate Complexity: [place] OTR/L Acute Rehabilitation Services Office: [place] 07/29/2020, 3:51 PM

## 2020-07-29 NOTE — Progress Notes (Signed)
PROGRESS NOTE                                                                                                                                                                                                             Patient Demographics:    [name], is a 75 y.o. female, DOB - 02-May-1946, XJO:[id number]  Outpatient Primary MD for the patient is Christain Sacramento, MD   Admit date - 07/27/2020   LOS - 2  Chief Complaint  Patient presents with  . Shortness of Breath  . Chest Pain    Started two weeks ago with progression to severe difficulty breathing and "bad cp"       Brief Narrative: Patient is a 75 y.o. female with PMHx of EtOH use, anemia of chronic disease, eczematous dermatitis, COPD on 3 L of oxygen at home, pulmonary hypertension-who presented with worsening cough-shortness of breath-found to have A. fib with RVR.  Started on Cardizem and heparin infusion-subsequently admitted to the hospitalist service.  COVID-19 vaccinated status: Vaccinated but not boosted  Significant Events: 2/12>> Admit to [facility] for COPD exacerbation-A. fib RVR-COVID-19 positive  Significant studies: 2/11>>Chest x-ray: Right> left basilar opacities 2/11>> CT abdomen/pelvis: No acute abdominal/pelvic findings, diffuse/severe fatty infiltration of liver 2/13 TTE - IMPRESSIONS  1. Left ventricular ejection fraction, by estimation, is 55 to 60%. The left ventricle has normal function. The left ventricle has no regional wall motion abnormalities. Left ventricular diastolic parameters were normal.  2. Right ventricular systolic function is normal. The right ventricular size is mildly enlarged.  3. The mitral valve is normal in structure. No evidence of mitral valve regurgitation.  4. The aortic valve is tricuspid. Aortic valve regurgitation is not visualized. No aortic stenosis is present.  5. The inferior vena cava is dilated in size with >50%  respiratory variability, suggesting right atrial pressure of 8 mmHg  COVID-19 medications: Steroids: 2/12>>  Antibiotics: Doxycycline: 2/12>>  Microbiology data: None  Procedures: None  Consults: Cardiology  DVT prophylaxis: enoxaparin (LOVENOX) injection 40 mg Start: 07/29/20 1000 SCDs Start: 07/27/20 2311     Subjective:   Patient in bed, appears comfortable, denies any headache, no fever, no chest pain or pressure, no shortness of breath , no abdominal pain. No focal weakness.   Assessment  & Plan :   COPD exacerbation: Probably triggered by  COVID-19 infection-stop Decadron-start IV Solu-Medrol and doxycycline, scheduled Xopenex-add Incruse inhaler.  She is stable-moving air-appears comfortable.  Back on her usual 3 L of oxygen (home regimen)  ?COVID-19 PNA: Possible pneumonic changes in x-ray chest-CRP elevated-however LFTs are significantly elevated.  Will treat with steroids-avoid Remdesivir due to significantly elevated LFTs.  Hypoxemia is at baseline-on usual 3 L of oxygen-no role for immunomodulators at this time.  Recent Labs  Lab 07/27/20 1515 07/27/20 1528 07/27/20 2047 07/27/20 2208 07/28/20 0216 07/28/20 0743 07/29/20 0305 07/29/20 0811  WBC 8.2  --   --   --  7.4  --  8.0  --   HGB 8.6*  --   --   --  7.8*  --  7.6*  --   HCT 28.1*  --   --   --  23.0*  --  21.9*  --   PLT 119*  --   --   --  118*  --  121*  --   CRP  --   --   --   --   --  8.1* 4.9*  --   BNP 104.4*  --   --   --   --   --   --  530.6*  DDIMER  --   --   --   --   --  1.93* 3.01*  --   PROCALCITON  --   --   --   --   --  0.61  --   --   AST 614*  --   --   --  1,154* 938* 467*  --   ALT 166*  --   --   --  268* 270* 236*  --   ALKPHOS 166*  --   --   --  163* 141* 137*  --   BILITOT 1.6*  --   --   --  1.8* 1.6* 0.8  --   ALBUMIN 2.7*  --   --   --  2.7* 2.7* 2.6*  --   INR  --   --   --  1.0  --   --   --  0.9  LATICACIDVEN 6.4*  --  4.0*  --   --   --   --   --    SARSCOV2NAA  --  POSITIVE*  --   --   --   --   --   --       PAF with RVR: Back in sinus rhythm-stop Cardizem infusion-starting short acting Cardizem-on IV heparin currently.  She is very frail-lives alone-alcohol use-has chronic normocytic anemia-I agree with cardiology that she is a poor long-term candidate for anticoagulation.  Probably better to stick with aspirin.  Echo noted, on aspirin only now.  Acute alcoholic hepatitis: LFTs significantly elevated-but seems to be slowly downtrending-discriminant function only 5.  No role for steroids-CT abdomen with significant fatty infiltration.  Supportive care-avoid hepatotoxic medications.  Follow LFTs  EtOH withdrawal: Tremulous-but awake/alert.  Claims that she drinks at least one 12 ounce beer-along with 3-4 shots of liquor on a daily basis.  Last drink was 2/11.  Continue Ativan per CIWA protocol.  Normocytic anemia: Await anemia panel-FOBT stools-watch closely.  History of pulmonary hypertension: Due to COPD-outpatient follow-up with pulmonary.  Continue oxygen supplementation as needed.  Deconditioning/debility/frailty: Await PT/OT eval.  Depression: Hold bupropion-allow LFTs to improve further before initiating.  Eczematous dermatitis-keratotic lesions in left breast/bilateral lower extremities  Obesity: Estimated body mass index is  19.23 kg/m as calculated from the following:   Height as of this encounter: _0  (1.575 m).   Weight as of this encounter: 47.7 kg.   RN pressure injury documentation: Pressure Injury 08/19/19 Sacrum Mid Stage 2 -  Partial thickness loss of dermis presenting as a shallow open injury with a red, pink wound bed without slough. (Active)  08/19/19 0200  Location: Sacrum  Location Orientation: Mid  Staging: Stage 2 -  Partial thickness loss of dermis presenting as a shallow open injury with a red, pink wound bed without slough.  Wound Description (Comments):   Present on Admission: Yes    GI  prophylaxis: PPI   Condition - Stable  Family Communication  :  Daughter-[phone] previous MD updated over the phone on 2/12- agreed with avoiding anticoagulation given alcohol use/safety-understands embolic/CVA risk, updated 07/29/2020 essential left at 11:37 AM  Code Status :  Full Code  Diet :  Diet Order            DIET DYS 2 Room service appropriate? Yes; Fluid consistency: Thin  Diet effective now                  Disposition Plan  :   Status is: Inpatient  Remains inpatient appropriate because:Inpatient level of care appropriate due to severity of illness   Dispo: The patient is from: Home              Anticipated d/c is to: SNF              Anticipated d/c date is: > 3 days              Patient currently is not medically stable to d/c.   Difficult to place patient No  Barriers to discharge: COPD exacerbation on IV steroids, COVID-19 infection, alcoholic hepatitis-needs continued inpatient monitoring/treatment.  Antimicorbials  :    Anti-infectives (From admission, onward)   Start     Dose/Rate Route Frequency Ordered Stop   07/28/20 1000  doxycycline (VIBRA-TABS) tablet 100 mg        100 mg Oral Every 12 hours 07/28/20 0813 08/02/20 0959      Inpatient Medications  Scheduled Meds: . aspirin  81 mg Oral Daily  . diltiazem  60 mg Oral Q6H  . doxycycline  100 mg Oral Q12H  . enoxaparin (LOVENOX) injection  40 mg Subcutaneous Q24H  . feeding supplement  237 mL Oral BID BM  . folic acid  1 mg Oral Daily  . levalbuterol  2 puff Inhalation Q6H  . losartan  25 mg Oral Daily  . mouth rinse  15 mL Mouth Rinse BID  . methylPREDNISolone (SOLU-MEDROL) injection  40 mg Intravenous BID  . multivitamin with minerals  1 tablet Oral Daily  . pantoprazole  40 mg Oral Q1200  . thiamine  100 mg Oral Daily   Or  . thiamine  100 mg Intravenous Daily  . umeclidinium bromide  1 puff Inhalation Daily   Continuous Infusions:  PRN Meds:.acetaminophen, alum & mag  hydroxide-simeth, LORazepam **OR** LORazepam, morphine injection, ondansetron (ZOFRAN) IV   Time Spent in minutes  25  See all Orders from today for further details   Lala Lund M.D on 07/29/2020 at 11:32 AM  To page go to [email or web address] - use universal password  [facility] -  Office  [phone]    Objective:   Vitals:   07/29/20 0344 07/29/20 0741 07/29/20 0946 07/29/20 1000  BP: 130/71 116/66  136/77  Pulse: 96 89  (!) 103  Resp: 17 (!) 22    Temp: 98.4 F (36.9 C)     TempSrc: Oral     SpO2: 98% 100%    Weight:   47.7 kg   Height:        Wt Readings from Last 3 Encounters:  07/29/20 47.7 kg  06/22/20 51.5 kg  02/13/20 50.2 kg     Intake/Output Summary (Last 24 hours) at 07/29/2020 1132 Last data filed at 07/29/2020 1047 Gross per 24 hour  Intake 866.65 ml  Output 825 ml  Net 41.65 ml     Physical Exam  Was sleeping but woken up, no focal deficits, [place].AT,PERRAL Supple Neck,No JVD, No cervical lymphadenopathy appriciated.  Symmetrical Chest wall movement, Good air movement bilaterally, CTAB RRR,No Gallops, Rubs or new Murmurs, No Parasternal Heave +ve B.Sounds, Abd Soft, No tenderness, No organomegaly appriciated, No rebound - guarding or rigidity. No Cyanosis, Clubbing or edema, No new Rash or bruise    Data Review:    CBC Recent Labs  Lab 07/27/20 1515 07/28/20 0216 07/29/20 0305  WBC 8.2 7.4 8.0  HGB 8.6* 7.8* 7.6*  HCT 28.1* 23.0* 21.9*  PLT 119* 118* 121*  MCV 101.8* 95.4 94.4  MCH 31.2 32.4 32.8  MCHC 30.6 33.9 34.7  RDW 18.2* 17.7* 17.7*  LYMPHSABS 1.9  --   --   MONOABS 1.6*  --   --   EOSABS 0.0  --   --   BASOSABS 0.1  --   --     Chemistries  Recent Labs  Lab 07/27/20 1515 07/28/20 0216 07/28/20 0743 07/29/20 0305  NA 143 141 144 140  K 4.4 4.3 4.3 4.0  CL 97* 93* 93* 93*  CO2 24 28 34* 35*  GLUCOSE 106* 215* 147* 219*  BUN _0 CREATININE 0.74 0.92 0.76 0.55  CALCIUM 8.4* 7.9* 8.2* 8.5*  MG   --  1.5*  --  2.3  AST 614* 1,154* 938* 467*  ALT 166* 268* 270* 236*  ALKPHOS 166* 163* 141* 137*  BILITOT 1.6* 1.8* 1.6* 0.8   ------------------------------------------------------------------------------------------------------------------ Recent Labs    07/28/20 0216  CHOL 136  HDL 83  LDLCALC 43  TRIG 48  CHOLHDL 1.6    Lab Results  Component Value Date   HGBA1C 6.1 (H) 05/13/2018   ------------------------------------------------------------------------------------------------------------------ Recent Labs    07/28/20 0216  TSH 0.953   ------------------------------------------------------------------------------------------------------------------ Recent Labs    07/28/20 0743  VITAMINB12 1,367*  FOLATE 10.9  FERRITIN 2,074*  TIBC 129*  IRON 52  RETICCTPCT 2.3    Coagulation profile Recent Labs  Lab 07/27/20 2208 07/29/20 0811  INR 1.0 0.9    Recent Labs    07/28/20 0743 07/29/20 0305  DDIMER 1.93* 3.01*    Cardiac Enzymes No results for input(s): CKMB, TROPONINI, MYOGLOBIN in the last 168 hours.  Invalid input(s): CK ------------------------------------------------------------------------------------------------------------------    Component Value Date/Time   BNP 530.6 (H) 07/29/2020 0160    Micro Results Recent Results (from the past 240 hour(s))  Resp Panel by RT-PCR (Flu A&B, Covid) Nasopharyngeal Swab     Status: Abnormal   Collection Time: 07/27/20  3:28 PM   Specimen: Nasopharyngeal Swab; Nasopharyngeal(NP) swabs in vial transport medium  Result Value Ref Range Status   SARS Coronavirus 2 by RT PCR POSITIVE (A) NEGATIVE Final    Comment: RESULT CALLED TO, READ BACK BY AND VERIFIED WITH: Wyvonna Plum RN [id number] 07/27/20 A BROWNING (NOTE) SARS-CoV-2  target nucleic acids are DETECTED.  The SARS-CoV-2 RNA is generally detectable in upper respiratory specimens during the acute phase of infection. Positive results are indicative of the  presence of the identified virus, but do not rule out bacterial infection or co-infection with other pathogens not detected by the test. Clinical correlation with patient history and other diagnostic information is necessary to determine patient infection status. The expected result is Negative.  Fact Sheet for Patients: EntrepreneurPulse.com.au  Fact Sheet for Healthcare Providers: IncredibleEmployment.be  This test is not yet approved or cleared by the [place] FDA and  has been authorized for detection and/or diagnosis of SARS-CoV-2 by FDA under an Emergency Use Authorization (EUA).  This EUA will remain in effect (meaning this test can  be used) for the duration of  the COVID-19 declaration under Section 564(b)(1) of the Act, 21 U.S.C. section 360bbb-3(b)(1), unless the authorization is terminated or revoked sooner.     Influenza A by PCR NEGATIVE NEGATIVE Final   Influenza B by PCR NEGATIVE NEGATIVE Final    Comment: (NOTE) The Xpert Xpress SARS-CoV-2/FLU/RSV plus assay is intended as an aid in the diagnosis of influenza from Nasopharyngeal swab specimens and should not be used as a sole basis for treatment. Nasal washings and aspirates are unacceptable for Xpert Xpress SARS-CoV-2/FLU/RSV testing.  Fact Sheet for Patients: EntrepreneurPulse.com.au  Fact Sheet for Healthcare Providers: IncredibleEmployment.be  This test is not yet approved or cleared by the [place] FDA and has been authorized for detection and/or diagnosis of SARS-CoV-2 by FDA under an Emergency Use Authorization (EUA). This EUA will remain in effect (meaning this test can be used) for the duration of the COVID-19 declaration under Section 564(b)(1) of the Act, 21 U.S.C. section 360bbb-3(b)(1), unless the authorization is terminated or revoked.  Performed at [facility], [place] [address], [place], [place] [address]      Radiology Reports CT ABDOMEN PELVIS W CONTRAST  Result Date: 07/27/2020 CLINICAL DATA:  Elevated liver function studies. EXAM: CT ABDOMEN AND PELVIS WITH CONTRAST TECHNIQUE: Multidetector CT imaging of the abdomen and pelvis was performed using the standard protocol following bolus administration of intravenous contrast. CONTRAST:  49m OMNIPAQUE IOHEXOL 300 MG/ML  SOLN COMPARISON:  None. FINDINGS: Lower chest: Moderate breathing motion artifact. There are emphysematous changes and pulmonary scarring but no definite infiltrates or effusions. The heart is within normal limits in size. Moderate aortic calcifications. Hepatobiliary: Diffuse and severe fatty infiltration of the liver with focal areas of more pronounced fatty change. No worrisome hepatic lesions or intrahepatic biliary dilatation. The gallbladder is unremarkable. No common bile duct dilatation. Pancreas: Advanced pancreatic atrophy but no mass, inflammation or ductal dilatation. Spleen: Normal size.  No focal lesions. Adrenals/Urinary Tract: Mild nodularity of both adrenal glands but no worrisome lesions. Scattered low-attenuation renal lesions are likely benign cysts. No worrisome renal lesions or hydronephrosis. 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 The bladder is unremarkable. Stomach/Bowel: The stomach, duodenum, small bowel and colon are grossly normal. Vascular/Lymphatic: Advanced atherosclerotic calcifications involving the aorta and branch vessels but no aneurysm. The major venous structures are patent. No mesenteric or retroperitoneal mass or adenopathy. Small scattered lymph nodes are noted. Reproductive: The uterus and ovaries are unremarkable. Other: No pelvic mass or adenopathy. No free pelvic fluid collections. No inguinal mass or adenopathy. No abdominal wall hernia or subcutaneous lesions. Musculoskeletal: No significant bony findings. IMPRESSION: 1. Diffuse and severe fatty infiltration of the  liver. 2. No acute abdominal/pelvic  findings, mass lesions or adenopathy. 3. Advanced atherosclerotic calcifications involving the aorta and branch vessels. 4. 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 5. Emphysematous changes and pulmonary scarring at the lung bases. 6. Emphysema and aortic atherosclerosis. Aortic Atherosclerosis (ICD10-I70.0) and Emphysema (ICD10-J43.9). Electronically Signed   By: Marijo Sanes M.D.   On: 07/27/2020 19:46   DG Chest Portable 1 View  Result Date: 07/27/2020 CLINICAL DATA:  SOB EXAM: PORTABLE CHEST 1 VIEW COMPARISON:  01/16/2020 and prior. FINDINGS: Emphysematous changes. Patchy right predominant basilar opacities. No pneumothorax or pleural effusion. Cardiomediastinal silhouette within normal limits. IMPRESSION: Right predominant basilar opacities, atelectasis versus infiltrate. Emphysema. Electronically Signed   By: Primitivo Gauze M.D.   On: 07/27/2020 15:25   ECHOCARDIOGRAM LIMITED  Result Date: 07/28/2020    ECHOCARDIOGRAM LIMITED REPORT   Patient Name:   [name] [facility] Date of Exam: 07/28/2020 Medical Rec #:  [id number]       Height:       62.0 in Accession #:    [id number]      Weight:       108.0 lb Date of Birth:  1945/09/07       BSA:          1.471 m Patient Age:    3 years        BP:           114/78 mmHg Patient Gender: F               HR:           83 bpm. Exam Location:  Inpatient Procedure: Limited Echo, Limited Color Doppler and Cardiac Doppler Indications:    atrial fibrillation  History:        Patient has no prior history of Echocardiogram examinations.                 Covid and COPD; Risk Factors:Hypertension.  Sonographer:    Johny Chess Referring Phys: [place]  1. Left ventricular ejection fraction, by estimation, is 55 to 60%. The left ventricle has normal function. The left ventricle has no regional wall motion abnormalities. Left ventricular diastolic parameters were normal.  2.  Right ventricular systolic function is normal. The right ventricular size is mildly enlarged.  3. The mitral valve is normal in structure. No evidence of mitral valve regurgitation.  4. The aortic valve is tricuspid. Aortic valve regurgitation is not visualized. No aortic stenosis is present.  5. The inferior vena cava is dilated in size with >50% respiratory variability, suggesting right atrial pressure of 8 mmHg. Comparison(s): No prior Echocardiogram. FINDINGS  Left Ventricle: Left ventricular ejection fraction, by estimation, is 55 to 60%. The left ventricle has normal function. The left ventricle has no regional wall motion abnormalities. The left ventricular internal cavity size was normal in size. There is  no left ventricular hypertrophy. Left ventricular diastolic parameters were normal. Right Ventricle: The right ventricular size is mildly enlarged. Right ventricular systolic function is normal. Mitral Valve: The mitral valve is normal in structure. Tricuspid Valve: The tricuspid valve is normal in structure. Tricuspid valve regurgitation is not demonstrated. Aortic Valve: The aortic valve is tricuspid. Aortic valve regurgitation is not visualized. No aortic stenosis is present. Aorta: The aortic root is normal in size and structure and the ascending aorta was not well visualized. Venous: The inferior vena cava is dilated in size with greater than 50% respiratory variability, suggesting right atrial pressure of  8 mmHg. LEFT VENTRICLE PLAX 2D LVIDd:         4.40 cm  Diastology LVIDs:         2.50 cm  LV e' medial:    10.60 cm/s LV PW:         0.80 cm  LV E/e' medial:  7.9 LV IVS:        0.70 cm  LV e' lateral:   11.50 cm/s LVOT diam:     1.90 cm  LV E/e' lateral: 7.3 LV SV:         66 LV SV Index:   45 LVOT Area:     2.84 cm  IVC IVC diam: 2.40 cm LEFT ATRIUM         Index LA diam:    3.70 cm 2.51 cm/m  AORTIC VALVE LVOT Vmax:   137.00 cm/s LVOT Vmean:  87.500 cm/s LVOT VTI:    0.233 m  AORTA Ao Root  diam: 2.90 cm MITRAL VALVE MV Area (PHT): 4.10 cm    SHUNTS MV Decel Time: 185 msec    Systemic VTI:  0.23 m MV E velocity: 83.60 cm/s  Systemic Diam: 1.90 cm MV A velocity: 71.10 cm/s MV E/A ratio:  1.18 Rudean Haskell MD Electronically signed by Rudean Haskell MD Signature Date/Time: 07/28/2020/5:07:15 PM    Final

## 2020-07-30 ENCOUNTER — Inpatient Hospital Stay (HOSPITAL_COMMUNITY): Payer: Medicare Other

## 2020-07-30 DIAGNOSIS — U071 COVID-19: Secondary | ICD-10-CM | POA: Diagnosis not present

## 2020-07-30 DIAGNOSIS — R7989 Other specified abnormal findings of blood chemistry: Secondary | ICD-10-CM | POA: Diagnosis not present

## 2020-07-30 DIAGNOSIS — I4891 Unspecified atrial fibrillation: Secondary | ICD-10-CM | POA: Diagnosis not present

## 2020-07-30 LAB — CBC
HCT: 22.9 % — ABNORMAL LOW (ref 36.0–46.0)
Hemoglobin: 7.6 g/dL — ABNORMAL LOW (ref 12.0–15.0)
MCH: 32.2 pg (ref 26.0–34.0)
MCHC: 33.2 g/dL (ref 30.0–36.0)
MCV: 97 fL (ref 80.0–100.0)
Platelets: 117 10*3/uL — ABNORMAL LOW (ref 150–400)
RBC: 2.36 MIL/uL — ABNORMAL LOW (ref 3.87–5.11)
RDW: 18.1 % — ABNORMAL HIGH (ref 11.5–15.5)
WBC: 9.5 10*3/uL (ref 4.0–10.5)
nRBC: 1.5 % — ABNORMAL HIGH (ref 0.0–0.2)

## 2020-07-30 LAB — BRAIN NATRIURETIC PEPTIDE: B Natriuretic Peptide: 425 pg/mL — ABNORMAL HIGH (ref 0.0–100.0)

## 2020-07-30 LAB — C-REACTIVE PROTEIN: CRP: 2.1 mg/dL — ABNORMAL HIGH (ref <1.0)

## 2020-07-30 LAB — COMPREHENSIVE METABOLIC PANEL
ALT: 176 U/L — ABNORMAL HIGH (ref 0–44)
AST: 184 U/L — ABNORMAL HIGH (ref 15–41)
Albumin: 2.5 g/dL — ABNORMAL LOW (ref 3.5–5.0)
Alkaline Phosphatase: 118 U/L (ref 38–126)
Anion gap: 9 (ref 5–15)
BUN: 18 mg/dL (ref 8–23)
CO2: 38 mmol/L — ABNORMAL HIGH (ref 22–32)
Calcium: 8.5 mg/dL — ABNORMAL LOW (ref 8.9–10.3)
Chloride: 91 mmol/L — ABNORMAL LOW (ref 98–111)
Creatinine, Ser: 0.51 mg/dL (ref 0.44–1.00)
GFR, Estimated: >60 mL/min (ref >60)
Glucose, Bld: 225 mg/dL — ABNORMAL HIGH (ref 70–99)
Potassium: 3.8 mmol/L (ref 3.5–5.1)
Sodium: 138 mmol/L (ref 135–145)
Total Bilirubin: 0.9 mg/dL (ref 0.3–1.2)
Total Protein: 4.9 g/dL — ABNORMAL LOW (ref 6.5–8.1)

## 2020-07-30 LAB — PROTIME-INR
INR: 1 (ref 0.8–1.2)
Prothrombin Time: 12.6 seconds (ref 11.4–15.2)

## 2020-07-30 LAB — MAGNESIUM: Magnesium: 2.1 mg/dL (ref 1.7–2.4)

## 2020-07-30 LAB — D-DIMER, QUANTITATIVE: D-Dimer, Quant: 2.99 ug/mL-FEU — ABNORMAL HIGH (ref 0.00–0.50)

## 2020-07-30 LAB — PATHOLOGIST SMEAR REVIEW: Path Review: 2142022

## 2020-07-30 MED ORDER — LORAZEPAM 1 MG PO TABS
1.0000 mg | ORAL_TABLET | ORAL | Status: DC | PRN
  Administered 2020-07-30 – 2020-08-01 (×5): 1 mg via ORAL
  Administered 2020-08-02: 2 mg via ORAL
  Filled 2020-07-30 (×3): qty 1
  Filled 2020-07-30: qty 2
  Filled 2020-07-30: qty 1

## 2020-07-30 MED ORDER — ENSURE ENLIVE PO LIQD
237.0000 mL | Freq: Three times a day (TID) | ORAL | Status: DC
  Administered 2020-07-31 – 2020-08-06 (×15): 237 mL via ORAL
  Filled 2020-07-30 (×2): qty 237

## 2020-07-30 MED ORDER — GUAIFENESIN-DM 100-10 MG/5ML PO SYRP
10.0000 mL | ORAL_SOLUTION | ORAL | Status: DC | PRN
  Administered 2020-07-30 – 2020-07-31 (×2): 10 mL via ORAL
  Filled 2020-07-30 (×2): qty 10

## 2020-07-30 MED ORDER — LORAZEPAM 2 MG/ML IJ SOLN: 1.0000 mg | INTRAMUSCULAR | Status: DC | PRN

## 2020-07-30 MED ORDER — BENZONATATE 100 MG PO CAPS: 200.0000 mg | ORAL_CAPSULE | Freq: Three times a day (TID) | ORAL | Status: DC | PRN

## 2020-07-30 NOTE — Progress Notes (Signed)
CSW unable to complete assessment for possible SNF placement. Patient requested to speak with CSW at another time. Patient not currently feeling well enough to complete assessment.  CSW will follow back up with patient in the am.

## 2020-07-30 NOTE — Progress Notes (Signed)
Initial Nutrition Assessment  DOCUMENTATION CODES:   Not applicable  INTERVENTION:    Increase Ensure Enlive to 1 container PO TID, each supplement provides 350 kcal and 20 grams of protein  Continue MVI with minerals daily  NUTRITION DIAGNOSIS:   Increased nutrient needs related to wound healing,acute illness (COVID-19) as evidenced by estimated needs.  GOAL:   Patient will meet greater than or equal to 90% of their needs  MONITOR:   PO intake,Supplement acceptance,Labs,Skin  REASON FOR ASSESSMENT:   Malnutrition Screening Tool    ASSESSMENT:   75 yo female admitted with COPD exacerbation, A fib with RVR, COVID-19 positive. PMH includes ETOH use, anemia of chronic disease, eczematous dermatitis, COPD, home oxygen 3L, pulmonary HTN.   Weight history reviewed.  Weight has been fairly stable over the past 6 months at ~50.2-51.5 kg. Unable to complete nutrition focused physical exam at this time. Diet was downgraded to dysphagia 2 with thin liquids on 2/13. Meal intakes documented at 10-25%.  She is receiving 2 Ensure Enlive/Plus supplements per day. Leg ultrasound ordered d/t elevated D-dimer. No evidence of DVT found however, portions of the exam were limited.   Labs reviewed.  Medications reviewed and include folic acid, Solumedrol, MVI with minerals, thiamine.   Diet Order:   Diet Order            DIET DYS 2 Room service appropriate? Yes; Fluid consistency: Thin  Diet effective now                 EDUCATION NEEDS:   Not appropriate for education at this time  Skin:  Skin Assessment: Skin Integrity Issues: Skin Integrity Issues:: Stage II Stage II: sacrum  Last BM:  2/13  Height:   Ht Readings from Last 1 Encounters:  07/27/20 _0  (1.575 m)    Weight:   Wt Readings from Last 1 Encounters:  07/30/20 50.2 kg    Ideal Body Weight:  50 kg  BMI:  Body mass index is 20.24 kg/m.  Estimated Nutritional Needs:   Kcal:   1500-1700  Protein:  75-85 gm  Fluid:  >/= 1.5 L    Lucas Mallow, RD, LDN, CNSC Please refer to Amion for contact information.

## 2020-07-30 NOTE — Progress Notes (Signed)
Lower extremity venous bilateral study completed.  Preliminary results relayed to RN.   See CV Proc for preliminary results report.   Ryan Palermo, RDMS  

## 2020-07-30 NOTE — Progress Notes (Signed)
PROGRESS NOTE                                                                                                                                                                                                             Patient Demographics:    [name], is a 75 y.o. female, DOB - [birth year], JKD:[id number]  Outpatient Primary MD for the patient is Christain Sacramento, MD   Admit date - 07/27/2020   LOS - 3  Chief Complaint  Patient presents with  . Shortness of Breath  . Chest Pain    Started two weeks ago with progression to severe difficulty breathing and "bad cp"       Brief Narrative: Patient is a 75 y.o. female with PMHx of EtOH use, anemia of chronic disease, eczematous dermatitis, COPD on 3 L of oxygen at home, pulmonary hypertension-who presented with worsening cough-shortness of breath-found to have A. fib with RVR.  Started on Cardizem and heparin infusion-subsequently admitted to the hospitalist service.  COVID-19 vaccinated status: Vaccinated but not boosted  Significant Events: 2/12>> Admit to [facility] for COPD exacerbation-A. fib RVR-COVID-19 positive  Significant studies: 2/11>>Chest x-ray: Right> left basilar opacities 2/11>> CT abdomen/pelvis: No acute abdominal/pelvic findings, diffuse/severe fatty infiltration of liver 2/13 TTE - IMPRESSIONS  1. Left ventricular ejection fraction, by estimation, is 55 to 60%. The left ventricle has normal function. The left ventricle has no regional wall motion abnormalities. Left ventricular diastolic parameters were normal.  2. Right ventricular systolic function is normal. The right ventricular size is mildly enlarged.  3. The mitral valve is normal in structure. No evidence of mitral valve regurgitation.  4. The aortic valve is tricuspid. Aortic valve regurgitation is not visualized. No aortic stenosis is present.  5. The inferior vena cava is dilated in size with >50%  respiratory variability, suggesting right atrial pressure of 8 mmHg  COVID-19 medications: Steroids: 2/12>>  Antibiotics: Doxycycline: 2/12>>  Microbiology data: None  Procedures: None  Consults: Cardiology  DVT prophylaxis: enoxaparin (LOVENOX) injection 40 mg Start: 07/29/20 1000 SCDs Start: 07/27/20 2311     Subjective:   Patient in bed, appears comfortable, denies any headache, no fever, no chest pain or pressure, no shortness of breath , no abdominal pain. No focal weakness.   Assessment  & Plan :   COPD exacerbation: Probably triggered by  COVID-19 infection-stop Decadron-start IV Solu-Medrol and doxycycline, scheduled Xopenex-add Incruse inhaler.  She is stable-moving air-appears comfortable.  Back on her usual 3 L of oxygen (home regimen)  ?COVID-19 PNA: Possible pneumonic changes in x-ray chest-CRP elevated-however LFTs are significantly elevated.  Will treat with steroids-avoid Remdesivir due to significantly elevated LFTs.  Hypoxemia is at baseline-on usual 3 L of oxygen-no role for immunomodulators at this time.  Recent Labs  Lab 07/27/20 1515 07/27/20 1528 07/27/20 2047 07/27/20 2208 07/28/20 0216 07/28/20 0743 07/29/20 0305 07/29/20 0811 07/30/20 0146  WBC 8.2  --   --   --  7.4  --  8.0  --  9.5  HGB 8.6*  --   --   --  7.8*  --  7.6*  --  7.6*  HCT 28.1*  --   --   --  23.0*  --  21.9*  --  22.9*  PLT 119*  --   --   --  118*  --  121*  --  117*  CRP  --   --   --   --   --  8.1* 4.9*  --  2.1*  BNP 104.4*  --   --   --   --   --   --  530.6* 425.0*  DDIMER  --   --   --   --   --  1.93* 3.01*  --  2.99*  PROCALCITON  --   --   --   --   --  0.61  --   --   --   AST 614*  --   --   --  1,154* 938* 467*  --  184*  ALT 166*  --   --   --  268* 270* 236*  --  176*  ALKPHOS 166*  --   --   --  163* 141* 137*  --  118  BILITOT 1.6*  --   --   --  1.8* 1.6* 0.8  --  0.9  ALBUMIN 2.7*  --   --   --  2.7* 2.7* 2.6*  --  2.5*  INR  --   --   --  1.0  --    --   --  0.9 1.0  LATICACIDVEN 6.4*  --  4.0*  --   --   --   --   --   --   SARSCOV2NAA  --  POSITIVE*  --   --   --   --   --   --   --       PAF with RVR: Back in sinus rhythm-stop Cardizem infusion-starting short acting Cardizem-on IV heparin currently.  She is very frail-lives alone-alcohol use-has chronic normocytic anemia-I agree with cardiology that she is a poor long-term candidate for anticoagulation.  Probably better to stick with aspirin.  Echo noted, on aspirin only now.  Elevated D-dimer.  Checking leg ultrasound, on prophylactic Lovenox, monitor, poor candidate for anticoagulation.    Acute alcoholic hepatitis: LFTs significantly elevated-but seems to be slowly downtrending-discriminant function only 5.  No role for steroids-CT abdomen with significant fatty infiltration.  Supportive care-avoid hepatotoxic medications.  Follow LFTs  EtOH withdrawal: Tremulous-but awake/alert.  Claims that she drinks at least one 12 ounce beer-along with 3-4 shots of liquor on a daily basis.  Last drink was 2/11.  Continue Ativan per CIWA protocol.  Normocytic anemia: Await anemia panel-FOBT stools-watch closely.  History of pulmonary hypertension: Due to  COPD-outpatient follow-up with pulmonary.  Continue oxygen supplementation as needed.  Deconditioning/debility/frailty: Await PT/OT eval.  Depression: Hold bupropion-allow LFTs to improve further before initiating.  Eczematous dermatitis-keratotic lesions in left breast/bilateral lower extremities  Obesity: Estimated body mass index is 20.24 kg/m as calculated from the following:   Height as of this encounter: _0  (1.575 m).   Weight as of this encounter: 50.2 kg.   RN pressure injury documentation: Pressure Injury 08/19/19 Sacrum Mid Stage 2 -  Partial thickness loss of dermis presenting as a shallow open injury with a red, pink wound bed without slough. (Active)  08/19/19 0200  Location: Sacrum  Location Orientation: Mid   Staging: Stage 2 -  Partial thickness loss of dermis presenting as a shallow open injury with a red, pink wound bed without slough.  Wound Description (Comments):   Present on Admission: Yes    GI prophylaxis: PPI   Condition - Stable  Family Communication  :  Daughter-Donna-[phone] previous MD updated over the phone on 2/12- agreed with avoiding anticoagulation given alcohol use/safety-understands embolic/CVA risk, updated 07/29/2020 essential left at 11:37 AM  Code Status :  Full Code  Diet :  Diet Order            DIET DYS 2 Room service appropriate? Yes; Fluid consistency: Thin  Diet effective now                  Disposition Plan  :   Status is: Inpatient  Remains inpatient appropriate because:Inpatient level of care appropriate due to severity of illness   Dispo: The patient is from: Home              Anticipated d/c is to: SNF              Anticipated d/c date is: > 3 days              Patient currently is not medically stable to d/c.   Difficult to place patient No  Barriers to discharge: COPD exacerbation on IV steroids, COVID-19 infection, alcoholic hepatitis-needs continued inpatient monitoring/treatment.  Antimicorbials  :    Anti-infectives (From admission, onward)   Start     Dose/Rate Route Frequency Ordered Stop   07/28/20 1000  doxycycline (VIBRA-TABS) tablet 100 mg        100 mg Oral Every 12 hours 07/28/20 0813 08/02/20 0959      Inpatient Medications  Scheduled Meds: . aspirin  81 mg Oral Daily  . diltiazem  60 mg Oral Q6H  . doxycycline  100 mg Oral Q12H  . enoxaparin (LOVENOX) injection  40 mg Subcutaneous Q24H  . feeding supplement  237 mL Oral BID BM  . folic acid  1 mg Oral Daily  . levalbuterol  2 puff Inhalation Q6H  . losartan  25 mg Oral Daily  . mouth rinse  15 mL Mouth Rinse BID  . methylPREDNISolone (SOLU-MEDROL) injection  40 mg Intravenous BID  . multivitamin with minerals  1 tablet Oral Daily  . pantoprazole  40 mg  Oral Q1200  . thiamine  100 mg Oral Daily   Or  . thiamine  100 mg Intravenous Daily  . umeclidinium bromide  1 puff Inhalation Daily   Continuous Infusions:  PRN Meds:.acetaminophen, alum & mag hydroxide-simeth, LORazepam **OR** LORazepam, morphine injection, ondansetron (ZOFRAN) IV   Time Spent in minutes  25  See all Orders from today for further details   Lala Lund M.D on 07/30/2020 at 8:44 AM  To page go to [email or web address] - use universal password  [facility] -  Office  [phone]    Objective:   Vitals:   07/29/20 2300 07/29/20 2303 07/30/20 0403 07/30/20 0700  BP:  111/60 (!) 116/59   Pulse: 88 96 77   Resp:  20 14   Temp:  98.2 F (36.8 C) 98.6 F (37 C)   TempSrc:  Axillary Oral   SpO2:  98% 100%   Weight:    50.2 kg  Height:        Wt Readings from Last 3 Encounters:  07/30/20 50.2 kg  06/22/20 51.5 kg  02/13/20 50.2 kg     Intake/Output Summary (Last 24 hours) at 07/30/2020 0844 Last data filed at 07/30/2020 0700 Gross per 24 hour  Intake 600 ml  Output 450 ml  Net 150 ml     Physical Exam  Awake Alert, No new F.N deficits, Normal affect [place].AT,PERRAL Supple Neck,No JVD, No cervical lymphadenopathy appriciated.  Symmetrical Chest wall movement, Good air movement bilaterally, CTAB RRR,No Gallops, Rubs or new Murmurs, No Parasternal Heave +ve B.Sounds, Abd Soft, No tenderness, No organomegaly appriciated, No rebound - guarding or rigidity. No Cyanosis, chronic diffuse bilateral upper extremity bruising from previous lab draws     Data Review:    CBC Recent Labs  Lab 07/27/20 1515 07/28/20 0216 07/29/20 0305 07/30/20 0146  WBC 8.2 7.4 8.0 9.5  HGB 8.6* 7.8* 7.6* 7.6*  HCT 28.1* 23.0* 21.9* 22.9*  PLT 119* 118* 121* 117*  MCV 101.8* 95.4 94.4 97.0  MCH 31.2 32.4 32.8 32.2  MCHC 30.6 33.9 34.7 33.2  RDW 18.2* 17.7* 17.7* 18.1*  LYMPHSABS 1.9  --   --   --   MONOABS 1.6*  --   --   --   EOSABS 0.0  --   --   --    BASOSABS 0.1  --   --   --     Chemistries  Recent Labs  Lab 07/27/20 1515 07/28/20 0216 07/28/20 0743 07/29/20 0305 07/30/20 0146  NA 143 141 144 140 138  K 4.4 4.3 4.3 4.0 3.8  CL 97* 93* 93* 93* 91*  CO2 24 28 34* 35* 38*  GLUCOSE 106* 215* 147* 219* 225*  BUN _0 CREATININE 0.74 0.92 0.76 0.55 0.51  CALCIUM 8.4* 7.9* 8.2* 8.5* 8.5*  MG  --  1.5*  --  2.3 2.1  AST 614* 1,154* 938* 467* 184*  ALT 166* 268* 270* 236* 176*  ALKPHOS 166* 163* 141* 137* 118  BILITOT 1.6* 1.8* 1.6* 0.8 0.9   ------------------------------------------------------------------------------------------------------------------ Recent Labs    07/28/20 0216  CHOL 136  HDL 83  LDLCALC 43  TRIG 48  CHOLHDL 1.6    Lab Results  Component Value Date   HGBA1C 6.1 (H) 05/13/2018   ------------------------------------------------------------------------------------------------------------------ Recent Labs    07/28/20 0216  TSH 0.953   ------------------------------------------------------------------------------------------------------------------ Recent Labs    07/28/20 0743  VITAMINB12 1,367*  FOLATE 10.9  FERRITIN 2,074*  TIBC 129*  IRON 52  RETICCTPCT 2.3    Coagulation profile Recent Labs  Lab 07/27/20 2208 07/29/20 0811 07/30/20 0146  INR 1.0 0.9 1.0    Recent Labs    07/29/20 0305 07/30/20 0146  DDIMER 3.01* 2.99*    Cardiac Enzymes No results for input(s): CKMB, TROPONINI, MYOGLOBIN in the last 168 hours.  Invalid input(s): CK ------------------------------------------------------------------------------------------------------------------    Component Value Date/Time   BNP 425.0 (H) 07/30/2020 9458  Micro Results Recent Results (from the past 240 hour(s))  Resp Panel by RT-PCR (Flu A&B, Covid) Nasopharyngeal Swab     Status: Abnormal   Collection Time: 07/27/20  3:28 PM   Specimen: Nasopharyngeal Swab; Nasopharyngeal(NP) swabs in vial  transport medium  Result Value Ref Range Status   SARS Coronavirus 2 by RT PCR POSITIVE (A) NEGATIVE Final    Comment: RESULT CALLED TO, READ BACK BY AND VERIFIED WITH: Wyvonna Plum RN [id number] 07/27/20 A BROWNING (NOTE) SARS-CoV-2 target nucleic acids are DETECTED.  The SARS-CoV-2 RNA is generally detectable in upper respiratory specimens during the acute phase of infection. Positive results are indicative of the presence of the identified virus, but do not rule out bacterial infection or co-infection with other pathogens not detected by the test. Clinical correlation with patient history and other diagnostic information is necessary to determine patient infection status. The expected result is Negative.  Fact Sheet for Patients: EntrepreneurPulse.com.au  Fact Sheet for Healthcare Providers: IncredibleEmployment.be  This test is not yet approved or cleared by the [place] FDA and  has been authorized for detection and/or diagnosis of SARS-CoV-2 by FDA under an Emergency Use Authorization (EUA).  This EUA will remain in effect (meaning this test can  be used) for the duration of  the COVID-19 declaration under Section 564(b)(1) of the Act, 21 U.S.C. section 360bbb-3(b)(1), unless the authorization is terminated or revoked sooner.     Influenza A by PCR NEGATIVE NEGATIVE Final   Influenza B by PCR NEGATIVE NEGATIVE Final    Comment: (NOTE) The Xpert Xpress SARS-CoV-2/FLU/RSV plus assay is intended as an aid in the diagnosis of influenza from Nasopharyngeal swab specimens and should not be used as a sole basis for treatment. Nasal washings and aspirates are unacceptable for Xpert Xpress SARS-CoV-2/FLU/RSV testing.  Fact Sheet for Patients: EntrepreneurPulse.com.au  Fact Sheet for Healthcare Providers: IncredibleEmployment.be  This test is not yet approved or cleared by the [place] FDA and has been  authorized for detection and/or diagnosis of SARS-CoV-2 by FDA under an Emergency Use Authorization (EUA). This EUA will remain in effect (meaning this test can be used) for the duration of the COVID-19 declaration under Section 564(b)(1) of the Act, 21 U.S.C. section 360bbb-3(b)(1), unless the authorization is terminated or revoked.  Performed at [facility], [place] [address] [place], [place] [address]     Radiology Reports CT ABDOMEN PELVIS W CONTRAST  Result Date: 07/27/2020 CLINICAL DATA:  Elevated liver function studies. EXAM: CT ABDOMEN AND PELVIS WITH CONTRAST TECHNIQUE: Multidetector CT imaging of the abdomen and pelvis was performed using the standard protocol following bolus administration of intravenous contrast. CONTRAST:  85m OMNIPAQUE IOHEXOL 300 MG/ML  SOLN COMPARISON:  None. FINDINGS: Lower chest: Moderate breathing motion artifact. There are emphysematous changes and pulmonary scarring but no definite infiltrates or effusions. The heart is within normal limits in size. Moderate aortic calcifications. Hepatobiliary: Diffuse and severe fatty infiltration of the liver with focal areas of more pronounced fatty change. No worrisome hepatic lesions or intrahepatic biliary dilatation. The gallbladder is unremarkable. No common bile duct dilatation. Pancreas: Advanced pancreatic atrophy but no mass, inflammation or ductal dilatation. Spleen: Normal size.  No focal lesions. Adrenals/Urinary Tract: Mild nodularity of both adrenal glands but no worrisome lesions. Scattered low-attenuation renal lesions are likely benign cysts. No worrisome renal lesions or hydronephrosis. 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 The bladder is unremarkable. Stomach/Bowel: The stomach, duodenum, small bowel and colon are  grossly normal. Vascular/Lymphatic: Advanced atherosclerotic calcifications involving the aorta and branch vessels but no aneurysm. The major venous  structures are patent. No mesenteric or retroperitoneal mass or adenopathy. Small scattered lymph nodes are noted. Reproductive: The uterus and ovaries are unremarkable. Other: No pelvic mass or adenopathy. No free pelvic fluid collections. No inguinal mass or adenopathy. No abdominal wall hernia or subcutaneous lesions. Musculoskeletal: No significant bony findings. IMPRESSION: 1. Diffuse and severe fatty infiltration of the liver. 2. No acute abdominal/pelvic findings, mass lesions or adenopathy. 3. Advanced atherosclerotic calcifications involving the aorta and branch vessels. 4. 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 5. Emphysematous changes and pulmonary scarring at the lung bases. 6. Emphysema and aortic atherosclerosis. Aortic Atherosclerosis (ICD10-I70.0) and Emphysema (ICD10-J43.9). Electronically Signed   By: Marijo Sanes M.D.   On: 07/27/2020 19:46   DG Chest Portable 1 View  Result Date: 07/27/2020 CLINICAL DATA:  SOB EXAM: PORTABLE CHEST 1 VIEW COMPARISON:  01/16/2020 and prior. FINDINGS: Emphysematous changes. Patchy right predominant basilar opacities. No pneumothorax or pleural effusion. Cardiomediastinal silhouette within normal limits. IMPRESSION: Right predominant basilar opacities, atelectasis versus infiltrate. Emphysema. Electronically Signed   By: Primitivo Gauze M.D.   On: 07/27/2020 15:25   ECHOCARDIOGRAM LIMITED  Result Date: 07/28/2020    ECHOCARDIOGRAM LIMITED REPORT   Patient Name:   BRITTNE KAWASAKI [facility] Date of Exam: 07/28/2020 Medical Rec #:  [id number]       Height:       62.0 in Accession #:    [id number]      Weight:       108.0 lb Date of Birth:  [birth year]       BSA:          1.471 m Patient Age:    5 years        BP:           114/78 mmHg Patient Gender: F               HR:           83 bpm. Exam Location:  Inpatient Procedure: Limited Echo, Limited Color Doppler and Cardiac Doppler Indications:    atrial fibrillation  History:         Patient has no prior history of Echocardiogram examinations.                 Covid and COPD; Risk Factors:Hypertension.  Sonographer:    Johny Chess Referring Phys: [place]  1. Left ventricular ejection fraction, by estimation, is 55 to 60%. The left ventricle has normal function. The left ventricle has no regional wall motion abnormalities. Left ventricular diastolic parameters were normal.  2. Right ventricular systolic function is normal. The right ventricular size is mildly enlarged.  3. The mitral valve is normal in structure. No evidence of mitral valve regurgitation.  4. The aortic valve is tricuspid. Aortic valve regurgitation is not visualized. No aortic stenosis is present.  5. The inferior vena cava is dilated in size with >50% respiratory variability, suggesting right atrial pressure of 8 mmHg. Comparison(s): No prior Echocardiogram. FINDINGS  Left Ventricle: Left ventricular ejection fraction, by estimation, is 55 to 60%. The left ventricle has normal function. The left ventricle has no regional wall motion abnormalities. The left ventricular internal cavity size was normal in size. There is  no left ventricular hypertrophy. Left ventricular diastolic parameters were normal. Right Ventricle: The right ventricular size is mildly enlarged. Right ventricular  systolic function is normal. Mitral Valve: The mitral valve is normal in structure. Tricuspid Valve: The tricuspid valve is normal in structure. Tricuspid valve regurgitation is not demonstrated. Aortic Valve: The aortic valve is tricuspid. Aortic valve regurgitation is not visualized. No aortic stenosis is present. Aorta: The aortic root is normal in size and structure and the ascending aorta was not well visualized. Venous: The inferior vena cava is dilated in size with greater than 50% respiratory variability, suggesting right atrial pressure of 8 mmHg. LEFT VENTRICLE PLAX 2D LVIDd:         4.40 cm  Diastology LVIDs:          2.50 cm  LV e' medial:    10.60 cm/s LV PW:         0.80 cm  LV E/e' medial:  7.9 LV IVS:        0.70 cm  LV e' lateral:   11.50 cm/s LVOT diam:     1.90 cm  LV E/e' lateral: 7.3 LV SV:         66 LV SV Index:   45 LVOT Area:     2.84 cm  IVC IVC diam: 2.40 cm LEFT ATRIUM         Index LA diam:    3.70 cm 2.51 cm/m  AORTIC VALVE LVOT Vmax:   137.00 cm/s LVOT Vmean:  87.500 cm/s LVOT VTI:    0.233 m  AORTA Ao Root diam: 2.90 cm MITRAL VALVE MV Area (PHT): 4.10 cm    SHUNTS MV Decel Time: 185 msec    Systemic VTI:  0.23 m MV E velocity: 83.60 cm/s  Systemic Diam: 1.90 cm MV A velocity: 71.10 cm/s MV E/A ratio:  1.18 Rudean Haskell MD Electronically signed by Rudean Haskell MD Signature Date/Time: 07/28/2020/5:07:15 PM    Final

## 2020-07-31 DIAGNOSIS — I4891 Unspecified atrial fibrillation: Secondary | ICD-10-CM | POA: Diagnosis not present

## 2020-07-31 LAB — COMPREHENSIVE METABOLIC PANEL
ALT: 135 U/L — ABNORMAL HIGH (ref 0–44)
AST: 70 U/L — ABNORMAL HIGH (ref 15–41)
Albumin: 2.6 g/dL — ABNORMAL LOW (ref 3.5–5.0)
Alkaline Phosphatase: 121 U/L (ref 38–126)
Anion gap: 7 (ref 5–15)
BUN: 16 mg/dL (ref 8–23)
CO2: 38 mmol/L — ABNORMAL HIGH (ref 22–32)
Calcium: 8.5 mg/dL — ABNORMAL LOW (ref 8.9–10.3)
Chloride: 93 mmol/L — ABNORMAL LOW (ref 98–111)
Creatinine, Ser: 0.47 mg/dL (ref 0.44–1.00)
GFR, Estimated: >60 mL/min (ref >60)
Glucose, Bld: 195 mg/dL — ABNORMAL HIGH (ref 70–99)
Potassium: 3.8 mmol/L (ref 3.5–5.1)
Sodium: 138 mmol/L (ref 135–145)
Total Bilirubin: 0.8 mg/dL (ref 0.3–1.2)
Total Protein: 5.1 g/dL — ABNORMAL LOW (ref 6.5–8.1)

## 2020-07-31 LAB — CBC
HCT: 26.5 % — ABNORMAL LOW (ref 36.0–46.0)
Hemoglobin: 8.3 g/dL — ABNORMAL LOW (ref 12.0–15.0)
MCH: 31.2 pg (ref 26.0–34.0)
MCHC: 31.3 g/dL (ref 30.0–36.0)
MCV: 99.6 fL (ref 80.0–100.0)
Platelets: 121 10*3/uL — ABNORMAL LOW (ref 150–400)
RBC: 2.66 MIL/uL — ABNORMAL LOW (ref 3.87–5.11)
RDW: 18.3 % — ABNORMAL HIGH (ref 11.5–15.5)
WBC: 11.7 10*3/uL — ABNORMAL HIGH (ref 4.0–10.5)
nRBC: 1 % — ABNORMAL HIGH (ref 0.0–0.2)

## 2020-07-31 LAB — PROTIME-INR
INR: 0.9 (ref 0.8–1.2)
Prothrombin Time: 12.1 seconds (ref 11.4–15.2)

## 2020-07-31 LAB — D-DIMER, QUANTITATIVE: D-Dimer, Quant: 3.14 ug/mL-FEU — ABNORMAL HIGH (ref 0.00–0.50)

## 2020-07-31 LAB — C-REACTIVE PROTEIN: CRP: 1 mg/dL — ABNORMAL HIGH (ref <1.0)

## 2020-07-31 LAB — MAGNESIUM: Magnesium: 1.9 mg/dL (ref 1.7–2.4)

## 2020-07-31 LAB — BRAIN NATRIURETIC PEPTIDE: B Natriuretic Peptide: 561.1 pg/mL — ABNORMAL HIGH (ref 0.0–100.0)

## 2020-07-31 MED ORDER — LIP MEDEX EX OINT
TOPICAL_OINTMENT | CUTANEOUS | Status: DC | PRN
  Filled 2020-07-31: qty 7

## 2020-07-31 NOTE — Progress Notes (Signed)
PROGRESS NOTE                                                                                                                                                                                                             Patient Demographics:    [name], is a 75 y.o. female, DOB - Oct 18, 1945, YTK:[id number]  Outpatient Primary MD for the patient is Christain Sacramento, MD   Admit date - 07/27/2020   LOS - 4  Chief Complaint  Patient presents with  . Shortness of Breath  . Chest Pain    Started two weeks ago with progression to severe difficulty breathing and "bad cp"       Brief Narrative: Patient is a 75 y.o. female with PMHx of EtOH use, anemia of chronic disease, eczematous dermatitis, COPD on 3 L of oxygen at home, pulmonary hypertension-who presented with worsening cough-shortness of breath-found to have A. fib with RVR.  Started on Cardizem and heparin infusion-subsequently admitted to the hospitalist service.  COVID-19 vaccinated status: Vaccinated but not boosted  Significant Events: 2/12>> Admit to [facility] for COPD exacerbation-A. fib RVR-COVID-19 positive  Significant studies: 2/11>>Chest x-ray: Right> left basilar opacities 2/11>> CT abdomen/pelvis: No acute abdominal/pelvic findings, diffuse/severe fatty infiltration of liver 2/12>> Leg [place] - No DVT 2/13 TTE - IMPRESSIONS  1. Left ventricular ejection fraction, by estimation, is 55 to 60%. The left ventricle has normal function. The left ventricle has no regional wall motion abnormalities. Left ventricular diastolic parameters were normal.  2. Right ventricular systolic function is normal. The right ventricular size is mildly enlarged.  3. The mitral valve is normal in structure. No evidence of mitral valve regurgitation.  4. The aortic valve is tricuspid. Aortic valve regurgitation is not visualized. No aortic stenosis is present.  5. The inferior vena cava is  dilated in size with >50% respiratory variability, suggesting right atrial pressure of 8 mmHg  COVID-19 medications: Steroids: 2/12>>  Antibiotics: Doxycycline: 2/12>>  Microbiology data: None  Procedures: None  Consults: Cardiology  DVT prophylaxis: enoxaparin (LOVENOX) injection 40 mg Start: 07/29/20 1000 SCDs Start: 07/27/20 2311     Subjective:   Patient in bed, appears comfortable, denies any headache, no fever, no chest pain or pressure, mild shortness of breath , no abdominal pain. No focal weakness.   Assessment  & Plan :  COPD exacerbation: Probably triggered by COVID-19 infection-now on IV Solu-Medrol and doxycycline, scheduled Xopenex-add Incruse inhaler.  She is stable-moving air-appears comfortable.  Back on her usual 3-4 L of oxygen (home regimen)  ?COVID-19 PNA: Possible pneumonic changes in x-ray chest-CRP elevated-however LFTs are significantly elevated.  Will treat with steroids-avoid Remdesivir due to significantly elevated LFTs.  Hypoxemia is at baseline-on usual 3 L of oxygen-no role for immunomodulators at this time.  Recent Labs  Lab 07/27/20 1515 07/27/20 1528 07/27/20 2047 07/27/20 2208 07/28/20 0216 07/28/20 0743 07/29/20 0305 07/29/20 0811 07/30/20 0146 07/31/20 0430  WBC 8.2  --   --   --  7.4  --  8.0  --  9.5 11.7*  HGB 8.6*  --   --   --  7.8*  --  7.6*  --  7.6* 8.3*  HCT 28.1*  --   --   --  23.0*  --  21.9*  --  22.9* 26.5*  PLT 119*  --   --   --  118*  --  121*  --  117* 121*  CRP  --   --   --   --   --  8.1* 4.9*  --  2.1* 1.0*  BNP 104.4*  --   --   --   --   --   --  530.6* 425.0* 561.1*  DDIMER  --   --   --   --   --  1.93* 3.01*  --  2.99* 3.14*  PROCALCITON  --   --   --   --   --  0.61  --   --   --   --   AST 614*  --   --   --  1,154* 938* 467*  --  184* 70*  ALT 166*  --   --   --  268* 270* 236*  --  176* 135*  ALKPHOS 166*  --   --   --  163* 141* 137*  --  118 121  BILITOT 1.6*  --   --   --  1.8* 1.6* 0.8  --   0.9 0.8  ALBUMIN 2.7*  --   --   --  2.7* 2.7* 2.6*  --  2.5* 2.6*  INR  --   --   --  1.0  --   --   --  0.9 1.0 0.9  LATICACIDVEN 6.4*  --  4.0*  --   --   --   --   --   --   --   SARSCOV2NAA  --  POSITIVE*  --   --   --   --   --   --   --   --       PAF with RVR: Back in sinus rhythm-stop Cardizem infusion-starting short acting Cardizem-on IV heparin currently.  She is very frail-lives alone-alcohol use-has chronic normocytic anemia-I agree with cardiology that she is a poor long-term candidate for anticoagulation.  Probably better to stick with aspirin.  Echo noted, on aspirin only now.  Elevated D-dimer. -ve leg ultrasound, on prophylactic Lovenox, monitor, poor candidate for anticoagulation.    Acute alcoholic hepatitis: LFTs significantly elevated-but seems to be slowly downtrending-discriminant function only 5.  No role for steroids-CT abdomen with significant fatty infiltration.  Supportive care-avoid hepatotoxic medications.  Follow LFTs  EtOH withdrawal: Tremulous-but awake/alert.  Claims that she drinks at least one 12 ounce beer-along with 3-4 shots of liquor on a  daily basis.  Last drink was 2/11.  Continue Ativan per CIWA protocol.  Normocytic anemia: Anemia panel suggestive of anemia of chronic disease, supportive care.  History of pulmonary hypertension: Due to COPD-outpatient follow-up with pulmonary.  Continue oxygen supplementation as needed.  Deconditioning/debility/frailty: Await PT/OT eval.  Depression: Hold bupropion-allow LFTs to improve further before initiating.  Eczematous dermatitis-keratotic lesions in left breast/bilateral lower extremities  Obesity: Estimated body mass index is 20.24 kg/m as calculated from the following:   Height as of this encounter: _0  (1.575 m).   Weight as of this encounter: 50.2 kg.   RN pressure injury documentation: Pressure Injury 08/19/19 Sacrum Mid Stage 2 -  Partial thickness loss of dermis presenting as a shallow  open injury with a red, pink wound bed without slough. (Active)  08/19/19 0200  Location: Sacrum  Location Orientation: Mid  Staging: Stage 2 -  Partial thickness loss of dermis presenting as a shallow open injury with a red, pink wound bed without slough.  Wound Description (Comments):   Present on Admission: Yes    GI prophylaxis: PPI   Condition - Stable  Family Communication  :  Daughter-Donna-[phone] previous MD updated over the phone on 2/12- agreed with avoiding anticoagulation given alcohol use/safety-understands embolic/CVA risk, updated 07/29/2020 essential left at 11:37 AM  Code Status :  Full Code  Diet :  Diet Order            DIET DYS 2 Room service appropriate? Yes; Fluid consistency: Thin  Diet effective now                  Disposition Plan  :   Status is: Inpatient  Remains inpatient appropriate because:Inpatient level of care appropriate due to severity of illness   Dispo: The patient is from: Home              Anticipated d/c is to: SNF              Anticipated d/c date is: > 3 days              Patient currently is not medically stable to d/c.   Difficult to place patient No  Barriers to discharge: COPD exacerbation on IV steroids, COVID-19 infection, alcoholic hepatitis-needs continued inpatient monitoring/treatment.  Antimicorbials  :    Anti-infectives (From admission, onward)   Start     Dose/Rate Route Frequency Ordered Stop   07/28/20 1000  doxycycline (VIBRA-TABS) tablet 100 mg        100 mg Oral Every 12 hours 07/28/20 0813 08/02/20 0559      Inpatient Medications  Scheduled Meds: . aspirin  81 mg Oral Daily  . diltiazem  60 mg Oral Q6H  . doxycycline  100 mg Oral Q12H  . enoxaparin (LOVENOX) injection  40 mg Subcutaneous Q24H  . feeding supplement  237 mL Oral TID BM  . folic acid  1 mg Oral Daily  . levalbuterol  2 puff Inhalation Q6H  . losartan  25 mg Oral Daily  . mouth rinse  15 mL Mouth Rinse BID  .  methylPREDNISolone (SOLU-MEDROL) injection  40 mg Intravenous BID  . multivitamin with minerals  1 tablet Oral Daily  . pantoprazole  40 mg Oral Q1200  . thiamine  100 mg Oral Daily   Or  . thiamine  100 mg Intravenous Daily  . umeclidinium bromide  1 puff Inhalation Daily   Continuous Infusions:  PRN Meds:.acetaminophen, alum & mag hydroxide-simeth, benzonatate, guaiFENesin-dextromethorphan,  LORazepam **OR** LORazepam, morphine injection, ondansetron (ZOFRAN) IV   Time Spent in minutes  25  See all Orders from today for further details   Lala Lund M.D on 07/31/2020 at 11:51 AM  To page go to [email or web address] - use universal password  [facility] -  Office  [phone]    Objective:   Vitals:   07/30/20 1929 07/31/20 0002 07/31/20 0341 07/31/20 0741  BP: 115/66 117/75 118/70 135/77  Pulse: 94 85 80 88  Resp: _0 Temp: 98.5 F (36.9 C) 99.4 F (37.4 C) 98.7 F (37.1 C) 98.7 F (37.1 C)  TempSrc: Axillary Axillary Axillary Oral  SpO2: 99% 92% 94% 96%  Weight:      Height:        Wt Readings from Last 3 Encounters:  07/30/20 50.2 kg  06/22/20 51.5 kg  02/13/20 50.2 kg     Intake/Output Summary (Last 24 hours) at 07/31/2020 1151 Last data filed at 07/31/2020 0830 Gross per 24 hour  Intake 120 ml  Output -  Net 120 ml     Physical Exam  Awake Alert, No new F.N deficits, extremely frail [place].AT,PERRAL Supple Neck,No JVD, No cervical lymphadenopathy appriciated.  Symmetrical Chest wall movement, Good air movement bilaterally, CTAB RRR,No Gallops, Rubs or new Murmurs, No Parasternal Heave +ve B.Sounds, Abd Soft, No tenderness, No organomegaly appriciated, No rebound - guarding or rigidity. No Cyanosis, chronic diffuse bilateral upper extremity bruising from previous lab draws   Data Review:    CBC  Recent Labs  Lab 07/27/20 1515 07/28/20 0216 07/29/20 0305 07/30/20 0146 07/31/20 0430  WBC 8.2 7.4 8.0 9.5 11.7*  HGB 8.6* 7.8* 7.6*  7.6* 8.3*  HCT 28.1* 23.0* 21.9* 22.9* 26.5*  PLT 119* 118* 121* 117* 121*  MCV 101.8* 95.4 94.4 97.0 99.6  MCH 31.2 32.4 32.8 32.2 31.2  MCHC 30.6 33.9 34.7 33.2 31.3  RDW 18.2* 17.7* 17.7* 18.1* 18.3*  LYMPHSABS 1.9  --   --   --   --   MONOABS 1.6*  --   --   --   --   EOSABS 0.0  --   --   --   --   BASOSABS 0.1  --   --   --   --     Chemistries  Recent Labs  Lab 07/28/20 0216 07/28/20 0743 07/29/20 0305 07/30/20 0146 07/31/20 0430  NA 141 144 140 138 138  K 4.3 4.3 4.0 3.8 3.8  CL 93* 93* 93* 91* 93*  CO2 28 34* 35* 38* 38*  GLUCOSE 215* 147* 219* 225* 195*  BUN _1 CREATININE 0.92 0.76 0.55 0.51 0.47  CALCIUM 7.9* 8.2* 8.5* 8.5* 8.5*  MG 1.5*  --  2.3 2.1 1.9  AST 1,154* 938* 467* 184* 70*  ALT 268* 270* 236* 176* 135*  ALKPHOS 163* 141* 137* 118 121  BILITOT 1.8* 1.6* 0.8 0.9 0.8   ------------------------------------------------------------------------------------------------------------------ No results for input(s): CHOL, HDL, LDLCALC, TRIG, CHOLHDL, LDLDIRECT in the last 72 hours.  Lab Results  Component Value Date   HGBA1C 6.1 (H) 05/13/2018   ------------------------------------------------------------------------------------------------------------------ No results for input(s): TSH, T4TOTAL, T3FREE, THYROIDAB in the last 72 hours.  Invalid input(s): FREET3 ------------------------------------------------------------------------------------------------------------------ No results for input(s): VITAMINB12, FOLATE, FERRITIN, TIBC, IRON, RETICCTPCT in the last 72 hours.  Coagulation profile Recent Labs  Lab 07/27/20 2208 07/29/20 0811 07/30/20 0146 07/31/20 0430  INR 1.0 0.9 1.0 0.9    Recent Labs  07/30/20 0146 07/31/20 0430  DDIMER 2.99* 3.14*    Cardiac Enzymes No results for input(s): CKMB, TROPONINI, MYOGLOBIN in the last 168 hours.  Invalid input(s):  CK ------------------------------------------------------------------------------------------------------------------    Component Value Date/Time   BNP 561.1 (H) 07/31/2020 0430    Micro Results Recent Results (from the past 240 hour(s))  Resp Panel by RT-PCR (Flu A&B, Covid) Nasopharyngeal Swab     Status: Abnormal   Collection Time: 07/27/20  3:28 PM   Specimen: Nasopharyngeal Swab; Nasopharyngeal(NP) swabs in vial transport medium  Result Value Ref Range Status   SARS Coronavirus 2 by RT PCR POSITIVE (A) NEGATIVE Final    Comment: RESULT CALLED TO, READ BACK BY AND VERIFIED WITH: Wyvonna Plum RN [id number] 07/27/20 A BROWNING (NOTE) SARS-CoV-2 target nucleic acids are DETECTED.  The SARS-CoV-2 RNA is generally detectable in upper respiratory specimens during the acute phase of infection. Positive results are indicative of the presence of the identified virus, but do not rule out bacterial infection or co-infection with other pathogens not detected by the test. Clinical correlation with patient history and other diagnostic information is necessary to determine patient infection status. The expected result is Negative.  Fact Sheet for Patients: EntrepreneurPulse.com.au  Fact Sheet for Healthcare Providers: IncredibleEmployment.be  This test is not yet approved or cleared by the [place] FDA and  has been authorized for detection and/or diagnosis of SARS-CoV-2 by FDA under an Emergency Use Authorization (EUA).  This EUA will remain in effect (meaning this test can  be used) for the duration of  the COVID-19 declaration under Section 564(b)(1) of the Act, 21 U.S.C. section 360bbb-3(b)(1), unless the authorization is terminated or revoked sooner.     Influenza A by PCR NEGATIVE NEGATIVE Final   Influenza B by PCR NEGATIVE NEGATIVE Final    Comment: (NOTE) The Xpert Xpress SARS-CoV-2/FLU/RSV plus assay is intended as an aid in the diagnosis of  influenza from Nasopharyngeal swab specimens and should not be used as a sole basis for treatment. Nasal washings and aspirates are unacceptable for Xpert Xpress SARS-CoV-2/FLU/RSV testing.  Fact Sheet for Patients: EntrepreneurPulse.com.au  Fact Sheet for Healthcare Providers: IncredibleEmployment.be  This test is not yet approved or cleared by the [place] FDA and has been authorized for detection and/or diagnosis of SARS-CoV-2 by FDA under an Emergency Use Authorization (EUA). This EUA will remain in effect (meaning this test can be used) for the duration of the COVID-19 declaration under Section 564(b)(1) of the Act, 21 U.S.C. section 360bbb-3(b)(1), unless the authorization is terminated or revoked.  Performed at [facility], [place] [address] [place], [place] [address]     Radiology Reports CT ABDOMEN PELVIS W CONTRAST  Result Date: 07/27/2020 CLINICAL DATA:  Elevated liver function studies. EXAM: CT ABDOMEN AND PELVIS WITH CONTRAST TECHNIQUE: Multidetector CT imaging of the abdomen and pelvis was performed using the standard protocol following bolus administration of intravenous contrast. CONTRAST:  65m OMNIPAQUE IOHEXOL 300 MG/ML  SOLN COMPARISON:  None. FINDINGS: Lower chest: Moderate breathing motion artifact. There are emphysematous changes and pulmonary scarring but no definite infiltrates or effusions. The heart is within normal limits in size. Moderate aortic calcifications. Hepatobiliary: Diffuse and severe fatty infiltration of the liver with focal areas of more pronounced fatty change. No worrisome hepatic lesions or intrahepatic biliary dilatation. The gallbladder is unremarkable. No common bile duct dilatation. Pancreas: Advanced pancreatic atrophy but no mass, inflammation or ductal dilatation. Spleen: Normal size.  No focal lesions. Adrenals/Urinary Tract: Mild nodularity  of both adrenal glands but no worrisome lesions.  Scattered low-attenuation renal lesions are likely benign cysts. No worrisome renal lesions or hydronephrosis. 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 The bladder is unremarkable. Stomach/Bowel: The stomach, duodenum, small bowel and colon are grossly normal. Vascular/Lymphatic: Advanced atherosclerotic calcifications involving the aorta and branch vessels but no aneurysm. The major venous structures are patent. No mesenteric or retroperitoneal mass or adenopathy. Small scattered lymph nodes are noted. Reproductive: The uterus and ovaries are unremarkable. Other: No pelvic mass or adenopathy. No free pelvic fluid collections. No inguinal mass or adenopathy. No abdominal wall hernia or subcutaneous lesions. Musculoskeletal: No significant bony findings. IMPRESSION: 1. Diffuse and severe fatty infiltration of the liver. 2. No acute abdominal/pelvic findings, mass lesions or adenopathy. 3. Advanced atherosclerotic calcifications involving the aorta and branch vessels. 4. 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 5. Emphysematous changes and pulmonary scarring at the lung bases. 6. Emphysema and aortic atherosclerosis. Aortic Atherosclerosis (ICD10-I70.0) and Emphysema (ICD10-J43.9). Electronically Signed   By: Marijo Sanes M.D.   On: 07/27/2020 19:46   DG Chest Portable 1 View  Result Date: 07/27/2020 CLINICAL DATA:  SOB EXAM: PORTABLE CHEST 1 VIEW COMPARISON:  01/16/2020 and prior. FINDINGS: Emphysematous changes. Patchy right predominant basilar opacities. No pneumothorax or pleural effusion. Cardiomediastinal silhouette within normal limits. IMPRESSION: Right predominant basilar opacities, atelectasis versus infiltrate. Emphysema. Electronically Signed   By: Primitivo Gauze M.D.   On: 07/27/2020 15:25   VAS [place] LOWER EXTREMITY VENOUS (DVT)  Result Date: 07/30/2020  Lower Venous DVT Study Indications: Covid, d-dimer.  Anticoagulation: Lovenox.  Limitations: Poor ultrasound/tissue interface due to superficial skin changes. Comparison Study: 03-20-2020 Prior LT lower extremity venous was negative for                   DVT. Performing Technologist: Darlin Coco RDMS  Examination Guidelines: A complete evaluation includes B-mode imaging, spectral Doppler, color Doppler, and power Doppler as needed of all accessible portions of each vessel. Bilateral testing is considered an integral part of a complete examination. Limited examinations for reoccurring indications may be performed as noted. The reflux portion of the exam is performed with the patient in reverse Trendelenburg.  +---------+---------------+---------+-----------+----------+-------------------+ RIGHT    CompressibilityPhasicitySpontaneityPropertiesThrombus Aging      +---------+---------------+---------+-----------+----------+-------------------+ CFV      Full           Yes      Yes                                      +---------+---------------+---------+-----------+----------+-------------------+ SFJ      Full                                                             +---------+---------------+---------+-----------+----------+-------------------+ FV Prox  Full                                                             +---------+---------------+---------+-----------+----------+-------------------+ FV Mid   Full                                                             +---------+---------------+---------+-----------+----------+-------------------+  FV DistalFull                                                             +---------+---------------+---------+-----------+----------+-------------------+ PFV      Full                                                             +---------+---------------+---------+-----------+----------+-------------------+ POP      Full           Yes      Yes                                       +---------+---------------+---------+-----------+----------+-------------------+ PTV      Full                                         Some segments not                                                         well visualized     +---------+---------------+---------+-----------+----------+-------------------+ PERO     Full                                         Some segments not                                                         well visualized     +---------+---------------+---------+-----------+----------+-------------------+   +---------+---------------+---------+-----------+----------+-------------------+ LEFT     CompressibilityPhasicitySpontaneityPropertiesThrombus Aging      +---------+---------------+---------+-----------+----------+-------------------+ CFV      Full           Yes      Yes                                      +---------+---------------+---------+-----------+----------+-------------------+ SFJ      Full                                                             +---------+---------------+---------+-----------+----------+-------------------+ FV Prox  Full                                                             +---------+---------------+---------+-----------+----------+-------------------+  FV Mid   Full                                                             +---------+---------------+---------+-----------+----------+-------------------+ FV DistalFull                                                             +---------+---------------+---------+-----------+----------+-------------------+ PFV      Full                                                             +---------+---------------+---------+-----------+----------+-------------------+ POP      Full           Yes      Yes                                      +---------+---------------+---------+-----------+----------+-------------------+  PTV      Full                                         Some segments not                                                         well visualized     +---------+---------------+---------+-----------+----------+-------------------+ PERO     Full                                         Some segments not                                                         well visualized     +---------+---------------+---------+-----------+----------+-------------------+     Summary: RIGHT: - There is no evidence of deep vein thrombosis in the lower extremity. However, portions of this examination were limited- see technologist comments above.  - No cystic structure found in the popliteal fossa.  LEFT: - There is no evidence of deep vein thrombosis in the lower extremity. However, portions of this examination were limited- see technologist comments above.  - No cystic structure found in the popliteal fossa.  *See table(s) above for measurements and observations. Electronically signed by Ruta Hinds MD on 07/30/2020 at 4:19:16 PM.    Final    ECHOCARDIOGRAM LIMITED  Result Date: 07/28/2020    ECHOCARDIOGRAM LIMITED REPORT  Patient Name:   [facility] Date of Exam: 07/28/2020 Medical Rec #:  [id number]       Height:       62.0 in Accession #:    [id number]      Weight:       108.0 lb Date of Birth:  [birth year]       BSA:          1.471 m Patient Age:    79 years        BP:           114/78 mmHg Patient Gender: F               HR:           83 bpm. Exam Location:  Inpatient Procedure: Limited Echo, Limited Color Doppler and Cardiac Doppler Indications:    atrial fibrillation  History:        Patient has no prior history of Echocardiogram examinations.                 Covid and COPD; Risk Factors:Hypertension.  Sonographer:    Johny Chess Referring Phys: [place]  1. Left ventricular ejection fraction, by estimation, is 55 to 60%. The left ventricle has normal function.  The left ventricle has no regional wall motion abnormalities. Left ventricular diastolic parameters were normal.  2. Right ventricular systolic function is normal. The right ventricular size is mildly enlarged.  3. The mitral valve is normal in structure. No evidence of mitral valve regurgitation.  4. The aortic valve is tricuspid. Aortic valve regurgitation is not visualized. No aortic stenosis is present.  5. The inferior vena cava is dilated in size with >50% respiratory variability, suggesting right atrial pressure of 8 mmHg. Comparison(s): No prior Echocardiogram. FINDINGS  Left Ventricle: Left ventricular ejection fraction, by estimation, is 55 to 60%. The left ventricle has normal function. The left ventricle has no regional wall motion abnormalities. The left ventricular internal cavity size was normal in size. There is  no left ventricular hypertrophy. Left ventricular diastolic parameters were normal. Right Ventricle: The right ventricular size is mildly enlarged. Right ventricular systolic function is normal. Mitral Valve: The mitral valve is normal in structure. Tricuspid Valve: The tricuspid valve is normal in structure. Tricuspid valve regurgitation is not demonstrated. Aortic Valve: The aortic valve is tricuspid. Aortic valve regurgitation is not visualized. No aortic stenosis is present. Aorta: The aortic root is normal in size and structure and the ascending aorta was not well visualized. Venous: The inferior vena cava is dilated in size with greater than 50% respiratory variability, suggesting right atrial pressure of 8 mmHg. LEFT VENTRICLE PLAX 2D LVIDd:         4.40 cm  Diastology LVIDs:         2.50 cm  LV e' medial:    10.60 cm/s LV PW:         0.80 cm  LV E/e' medial:  7.9 LV IVS:        0.70 cm  LV e' lateral:   11.50 cm/s LVOT diam:     1.90 cm  LV E/e' lateral: 7.3 LV SV:         66 LV SV Index:   45 LVOT Area:     2.84 cm  IVC IVC diam: 2.40 cm LEFT ATRIUM         Index LA diam:    3.70  cm 2.51 cm/m  AORTIC VALVE  LVOT Vmax:   137.00 cm/s LVOT Vmean:  87.500 cm/s LVOT VTI:    0.233 m  AORTA Ao Root diam: 2.90 cm MITRAL VALVE MV Area (PHT): 4.10 cm    SHUNTS MV Decel Time: 185 msec    Systemic VTI:  0.23 m MV E velocity: 83.60 cm/s  Systemic Diam: 1.90 cm MV A velocity: 71.10 cm/s MV E/A ratio:  1.18 Rudean Haskell MD Electronically signed by Rudean Haskell MD Signature Date/Time: 07/28/2020/5:07:15 PM    Final

## 2020-07-31 NOTE — NC FL2 (Signed)
[place] LEVEL OF CARE SCREENING TOOL     IDENTIFICATION  Patient Name: [name] Birthdate: [birth year] Sex: female Admission Date (Current Location): 07/27/2020  [facility] and [place] Number:  Herbalist and Address:  [facility] [place] [address], [place], [place] [address]      Provider Number: [id number]  Attending Physician Name and Address:  Thurnell Lose, MD  Relative Name and Phone Number:  Marcelyn Bruins [phone]    Current Level of Care: Hospital Recommended Level of Care: [place] Prior Approval Number:    Date Approved/Denied:   PASRR Number: [id number]  Discharge Plan: SNF    Current Diagnoses: Patient Active Problem List   Diagnosis Date Noted  . New onset a-fib ([place]) 07/27/2020  . COVID-19 virus infection 07/27/2020  . Chest pain, musculoskeletal 02/14/2020  . Need for vaccination 10/05/2019  . Rheumatoid factor positive 09/19/2019  . 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 Protein-calorie malnutrition, severe 08/20/2019  . Severe sepsis ([place]) 08/19/2019  . Acute lower UTI 08/19/2019  . Pressure injury of skin 08/19/2019  . ARF (acute renal failure) ([place]) 08/18/2019  . Hyperkalemia 08/18/2019  . Transaminitis 08/18/2019  . Generalized weakness 08/18/2019  . Leukocytosis 08/18/2019  . Anemia 08/18/2019  . Acute renal failure (ARF) ([place]) 08/18/2019  . Chronic respiratory failure with hypoxia and hypercapnia ([place]) 06/02/2018  . COPD with acute exacerbation ([place]) 05/09/2018  . Hyponatremia 05/09/2018  . Acute urinary retention 05/09/2018  . Cystocele with prolapse 03/02/2018  . Cor pulmonale, chronic ([place]) 06/29/2017  . Chronic respiratory failure with hypoxia ([place]) 06/30/2014  . Pulmonary infiltrates 01/26/2013  . Multiple pulmonary nodules determined by computed tomography of lung 08/19/2012  . Cancer screening 05/12/2012  . COPD exacerbation ([place]) 05/12/2012  .  Essential hypertension 03/28/2012  . COPD GOLD III 12/02/2011  . Chronic cough 12/02/2011  . Cigarette smoker 12/02/2011    Orientation RESPIRATION BLADDER Height & Weight     Self,Time,Situation,Place  O2 (Nasal Cannula 3L) Incontinent Weight: 110 lb 10.7 oz (50.2 kg) Height:  _0  (157.5 cm)  BEHAVIORAL SYMPTOMS/MOOD NEUROLOGICAL BOWEL NUTRITION STATUS      Continent Diet (see d/c summary)  AMBULATORY STATUS COMMUNICATION OF NEEDS Skin   Extensive Assist Verbally Skin abrasions (Sacrum Mid Stage 2 Partial thickness loss of dermis)                       Personal Care Assistance Level of Assistance  Bathing,Feeding,Dressing Bathing Assistance: Maximum assistance Feeding assistance: Independent Dressing Assistance: Maximum assistance     Functional Limitations Info  Sight,Hearing,Speech Sight Info: Adequate Hearing Info: Adequate Speech Info: Adequate    SPECIAL CARE FACTORS FREQUENCY  PT (By licensed PT),OT (By licensed OT)     PT Frequency: 5X per week OT Frequency: 5X per week            Contractures Contractures Info: Not present    Additional Factors Info  Code Status,Allergies,Isolation Precautions Code Status Info: Full Allergies Info: Tramadol     Isolation Precautions Info: COVID+ 07/27/20 vaccinated [place] 5/25 & 12/05/19     Current Medications (07/31/2020):  This is the current hospital active medication list Current Facility-Administered Medications  Medication Dose Route Frequency Provider Last Rate Last Admin  . acetaminophen (TYLENOL) tablet 650 mg  650 mg Oral Q4H PRN Elwyn Reach, MD   650 mg at 07/31/20 1342  . alum & mag hydroxide-simeth (MAALOX/MYLANTA) 200-200-20 MG/5ML  suspension 30 mL  30 mL Oral Q6H PRN Jonetta Osgood, MD      . aspirin chewable tablet 81 mg  81 mg Oral Daily Jonetta Osgood, MD   81 mg at 07/31/20 2111  . benzonatate (TESSALON) capsule 200 mg  200 mg Oral TID PRN Thurnell Lose, MD      . diltiazem  (CARDIZEM) tablet 60 mg  60 mg Oral Q6H Ghimire, Henreitta Leber, MD   60 mg at 07/31/20 1340  . doxycycline (VIBRA-TABS) tablet 100 mg  100 mg Oral Q12H Thurnell Lose, MD   100 mg at 07/31/20 5520  . enoxaparin (LOVENOX) injection 40 mg  40 mg Subcutaneous Q24H Thurnell Lose, MD   40 mg at 07/31/20 0834  . feeding supplement (ENSURE ENLIVE / ENSURE PLUS) liquid 237 mL  237 mL Oral TID BM Thurnell Lose, MD   237 mL at 07/31/20 0838  . folic acid (FOLVITE) tablet 1 mg  1 mg Oral Daily Elwyn Reach, MD   1 mg at 07/31/20 8022  . guaiFENesin-dextromethorphan (ROBITUSSIN DM) 100-10 MG/5ML syrup 10 mL  10 mL Oral Q4H PRN Thurnell Lose, MD   10 mL at 07/31/20 0833  . levalbuterol (XOPENEX HFA) inhaler 2 puff  2 puff Inhalation Q6H Jonetta Osgood, MD   2 puff at 07/31/20 1335  . LORazepam (ATIVAN) tablet 1-4 mg  1-4 mg Oral Q1H PRN Thurnell Lose, MD   1 mg at 07/31/20 1340   Or  . LORazepam (ATIVAN) injection 1-4 mg  1-4 mg Intravenous Q1H PRN Thurnell Lose, MD      . losartan (COZAAR) tablet 25 mg  25 mg Oral Daily Charolette Forward, MD   25 mg at 07/31/20 0834  . MEDLINE mouth rinse  15 mL Mouth Rinse BID Jonetta Osgood, MD   15 mL at 07/31/20 0835  . methylPREDNISolone sodium succinate (SOLU-MEDROL) 40 mg/mL injection 40 mg  40 mg Intravenous BID Jonetta Osgood, MD   40 mg at 07/31/20 3361  . morphine 2 MG/ML injection 1-2 mg  1-2 mg Intravenous Q4H PRN Jonetta Osgood, MD   2 mg at 07/29/20 2252  . multivitamin with minerals tablet 1 tablet  1 tablet Oral Daily Elwyn Reach, MD   1 tablet at 07/31/20 [phone]  . ondansetron (ZOFRAN) injection 4 mg  4 mg Intravenous Q6H PRN Gala Romney L, MD      . pantoprazole (PROTONIX) EC tablet 40 mg  40 mg Oral Q1200 Jonetta Osgood, MD   40 mg at 07/31/20 1340  . thiamine tablet 100 mg  100 mg Oral Daily Gala Romney L, MD   100 mg at 07/31/20 9753   Or  . thiamine (B-1) injection 100 mg  100 mg Intravenous Daily  Gala Romney L, MD      . umeclidinium bromide (INCRUSE ELLIPTA) 62.5 MCG/INH 1 puff  1 puff Inhalation Daily Jonetta Osgood, MD   1 puff at 07/31/20 0051     Discharge Medications: Please see discharge summary for a list of discharge medications.  Relevant Imaging Results:  Relevant Lab Results:   Additional Information SS#:[id number]  Pernella Ackerley, LCSWA

## 2020-07-31 NOTE — TOC Initial Note (Signed)
Transition of Care [facility]) - Initial/Assessment Note    Patient Details  Name: [name] MRN: [id number] Date of Birth: January 31, 1946  Transition [facility] CM/SW Contact:    [place], [place] Phone Number: [phone] 07/31/2020, 2:13 PM  Clinical Narrative:          CSW received consult for possible SNF placement at time of discharge. CSW spoke with patient. Patient reported that she is currently unable to care for herself at her home given her current physical needs and fall risk. Patient expressed understanding of PT recommendation and is agreeable to SNF placement at time of discharge. Patient reports preference for SNF placement locally in [place]. CSW discussed insurance authorization process and provided Medicare SNF ratings list. Patient has received the COVID vaccines. Patient expressed being hopeful for rehab and to feel better soon. CSW discussed etoh use with patient and she declined resources at this time. CSW offered to update family for her and the patient requested CSW contact her friend, [name], [phone]. As per patients request CSW reached out to patients friend and updated about discharge plan. No further questions reported at this time.       CSW will continue seeking SNF placement for [name].      Expected Discharge Plan: [place]     Patient Goals and CMS Choice        Expected Discharge Plan and Services Expected Discharge Plan: [place]                                              Prior Living Arrangements/Services                       Activities of Daily Living Home Assistive Devices/Equipment: None ADL Screening (condition at time of admission) Patient's cognitive ability adequate to safely complete daily activities?: No Is the patient deaf or have difficulty hearing?: No Does the patient have difficulty seeing, even when wearing glasses/contacts?: No Does the patient have  difficulty concentrating, remembering, or making decisions?: No Patient able to express need for assistance with ADLs?: Yes Does the patient have difficulty dressing or bathing?: Yes Independently performs ADLs?: No Communication: Independent Dressing (OT): Needs assistance Is this a change from baseline?: Change from baseline, expected to last >3 days Grooming: Needs assistance Is this a change from baseline?: Change from baseline, expected to last >3 days Feeding: Needs assistance Is this a change from baseline?: Change from baseline, expected to last >3 days Bathing: Needs assistance Is this a change from baseline?: Change from baseline, expected to last >3 days Toileting: Needs assistance Is this a change from baseline?: Change from baseline, expected to last >3days In/Out Bed: Needs assistance Is this a change from baseline?: Change from baseline, expected to last >3 days Walks in Home: Needs assistance Is this a change from baseline?: Change from baseline, expected to last >3 days Does the patient have difficulty walking or climbing stairs?: Yes Weakness of Legs: Both Weakness of Arms/Hands: Both  Permission Sought/Granted                  Emotional Assessment              Admission diagnosis:  Hypoxia [R09.02] New onset a-fib ([place]) [I48.91] Atrial fibrillation with rapid ventricular response ([place]) [I48.91] Alcohol withdrawal syndrome without complication (  [place]) [F10.230] COVID-19 [U07.1] Patient Active Problem List   Diagnosis Date Noted  . New onset a-fib ([place]) 07/27/2020  . COVID-19 virus infection 07/27/2020  . Chest pain, musculoskeletal 02/14/2020  . Need for vaccination 10/05/2019  . Rheumatoid factor positive 09/19/2019  . 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 Protein-calorie malnutrition, severe 08/20/2019  . Severe sepsis ([place]) 08/19/2019  . Acute lower UTI 08/19/2019  . Pressure injury of skin 08/19/2019  . ARF (acute renal failure) ([place])  08/18/2019  . Hyperkalemia 08/18/2019  . Transaminitis 08/18/2019  . Generalized weakness 08/18/2019  . Leukocytosis 08/18/2019  . Anemia 08/18/2019  . Acute renal failure (ARF) ([place]) 08/18/2019  . Chronic respiratory failure with hypoxia and hypercapnia ([place]) 06/02/2018  . COPD with acute exacerbation ([place]) 05/09/2018  . Hyponatremia 05/09/2018  . Acute urinary retention 05/09/2018  . Cystocele with prolapse 03/02/2018  . Cor pulmonale, chronic ([place]) 06/29/2017  . Chronic respiratory failure with hypoxia ([place]) 06/30/2014  . Pulmonary infiltrates 01/26/2013  . Multiple pulmonary nodules determined by computed tomography of lung 08/19/2012  . Cancer screening 05/12/2012  . COPD exacerbation ([place]) 05/12/2012  . Essential hypertension 03/28/2012  . COPD GOLD III 12/02/2011  . Chronic cough 12/02/2011  . Cigarette smoker 12/02/2011   PCP:  Christain Sacramento, MD Pharmacy:   [facility] [place], [place] - [place] [address] Phone: [phone] Fax: [place], [place] [address] [place] [address] Phone: [phone] Fax: [phone]  [place], [place]. [place] [place] [address] Phone: [phone] Fax: [phone]     Social Determinants of Health (SDOH) Interventions    Readmission Risk Interventions No flowsheet data found.

## 2020-07-31 NOTE — Progress Notes (Signed)
Physical Therapy Treatment Patient Details Name: [name] MRN: [id number] DOB: 21-Sep-1945 Today's Date: 07/31/2020    History of Present Illness Pt is a 75 y.o. female admitted 07/27/20 with worsening cough and SOB; tested (+) COVID-19. Workup for afib with RVR, COPD exacerbation, potential COVID-19 PNA. PMH includes COPD (wears 3L O2 baseline), pulmonary HTN, ETOH use, anemia, exzematous dermatitis.   PT Comments    Pt slowly progressing with mobility. Today's session focused on transfer training with RW, pt able to take a few steps, requiring up to modA to maintain standing. Pt remains limited by diffuse weakness and decreased activity tolerance, requiring prolonged seated rest to recover DOE 4/4 with very minimal activity bouts. Continue to recommend SNF-level therapies to maximize functional mobility and independence prior to return home.  SpO2 96% on 3L O2 [place] (pt reports wearing baseline 3-4L O2 at home)    Follow Up Recommendations  SNF;Supervision for mobility/OOB     Equipment Recommendations  None recommended by PT    Recommendations for Other Services       Precautions / Restrictions Precautions Precautions: Fall;Other (comment) Precaution Comments: H/o COPD wears 3-4L O2 baseline Restrictions Weight Bearing Restrictions: No    Mobility  Bed Mobility Overal bed mobility: Needs Assistance Bed Mobility: Supine to Sit     Supine to sit: Supervision;HOB elevated     General bed mobility comments: Use of bed rail, no physical assist to sit EOB; increased time for seated rest break to recover SOB    Transfers Overall transfer level: Needs assistance Equipment used: 1 person hand held assist;Rolling walker (2 wheeled) Transfers: Sit to/from Stand Sit to Stand: Mod assist;Min guard Stand pivot transfers: Mod assist       General transfer comment: Initial modA with HHA to maintain balance standing from EOB with pivot to [facility]; additional stand from [facility] to RW  with min guard, heavy reliance on UE support to push into standing  Ambulation/Gait Ambulation/Gait assistance: Min assist Gait Distance (Feet): 2 Feet Assistive device: Rolling walker (2 wheeled) Gait Pattern/deviations: Step-to pattern;Trunk flexed;Leaning posteriorly Gait velocity: Decreased   General Gait Details: Steps from [facility] to recliner with RW and minA, pt requriing cues for sequencing and safety; seems distracted by significant SOB and anxiety. Mobility limited by very poor activity tolerance with DOE 4/4 requiring seated rest, pt stating "I can't breathe"; SpO2 96% on 3L O2 [place]   Stairs             Wheelchair Mobility    Modified Rankin (Stroke Patients Only)       Balance Overall balance assessment: Needs assistance Sitting-balance support: No upper extremity supported;Feet supported Sitting balance-Leahy Scale: Fair Sitting balance - Comments: able to sit unsupported without LOB   Standing balance support: Bilateral upper extremity supported;During functional activity;Single extremity supported Standing balance-Leahy Scale: Poor Standing balance comment: Heavy reliance on UE support; dependent for posterior pericare and assist to doff mesh underwear while standing                            Cognition Arousal/Alertness: Awake/alert Behavior During Therapy: WFL for tasks assessed/performed Overall Cognitive Status: No family/caregiver present to determine baseline cognitive functioning Area of Impairment: Attention;Following commands;Safety/judgement;Awareness;Problem solving                   Current Attention Level: Selective   Following Commands: Follows one step commands with increased time Safety/Judgement: Decreased awareness of deficits Awareness: Emergent  Problem Solving: Slow processing;Requires verbal cues General Comments: Increased time and cues for processing and completing tasks; likely more related to fatigue and anxiety  regarding difficulty breathing ("can you please get something for my nerves"), rather than true cognitive impariment      Exercises      General Comments General comments (skin integrity, edema, etc.): SpO2 100% on 4L O2 [place], maintaining 96% when titrated down to 3L O2. Pt reports wearing 3-4L O2 [place] at home. Pt requesting "medicine for my nerves" (RN on break, NT to notifiy her upon return)      Pertinent Vitals/Pain Pain Assessment: Faces Faces Pain Scale: Hurts a little bit Pain Location: Generalized Pain Descriptors / Indicators: Tiring;Discomfort Pain Intervention(s): Monitored during session;Limited activity within patient's tolerance    Home Living                      Prior Function            PT Goals (current goals can now be found in the care plan section) Progress towards PT goals: Progressing toward goals    Frequency    Min 3X/week      PT Plan Current plan remains appropriate    Co-evaluation              AM-PAC PT "6 Clicks" Mobility   Outcome Measure  Help needed turning from your back to your side while in a flat bed without using bedrails?: None Help needed moving from lying on your back to sitting on the side of a flat bed without using bedrails?: A Little Help needed moving to and from a bed to a chair (including a wheelchair)?: A Little Help needed standing up from a chair using your arms (e.g., wheelchair or bedside chair)?: A Lot Help needed to walk in hospital room?: A Lot Help needed climbing 3-5 steps with a railing? : Total 6 Click Score: 15    End of Session Equipment Utilized During Treatment: Oxygen Activity Tolerance: Patient limited by fatigue Patient left: in chair;with call bell/phone within reach;with chair alarm set;with nursing/sitter in room Nurse Communication: Mobility status PT Visit Diagnosis: Unsteadiness on feet (R26.81);Muscle weakness (generalized) (M62.81);Difficulty in walking, not elsewhere classified  (R26.2)     Time: 3244-0102 PT Time Calculation (min) (ACUTE ONLY): 33 min  Charges:  $Therapeutic Activity: 23-37 mins                     Mabeline Caras, PT, DPT Acute Rehabilitation Services  Pager [phone] Office [place] 07/31/2020, 2:16 PM

## 2020-08-01 ENCOUNTER — Inpatient Hospital Stay (HOSPITAL_COMMUNITY): Payer: Medicare Other

## 2020-08-01 DIAGNOSIS — I4891 Unspecified atrial fibrillation: Secondary | ICD-10-CM | POA: Diagnosis not present

## 2020-08-01 LAB — C-REACTIVE PROTEIN: CRP: 0.7 mg/dL (ref <1.0)

## 2020-08-01 LAB — MAGNESIUM: Magnesium: 1.9 mg/dL (ref 1.7–2.4)

## 2020-08-01 LAB — CBC
HCT: 26.1 % — ABNORMAL LOW (ref 36.0–46.0)
Hemoglobin: 8.2 g/dL — ABNORMAL LOW (ref 12.0–15.0)
MCH: 31.3 pg (ref 26.0–34.0)
MCHC: 31.4 g/dL (ref 30.0–36.0)
MCV: 99.6 fL (ref 80.0–100.0)
Platelets: 111 10*3/uL — ABNORMAL LOW (ref 150–400)
RBC: 2.62 MIL/uL — ABNORMAL LOW (ref 3.87–5.11)
RDW: 18.5 % — ABNORMAL HIGH (ref 11.5–15.5)
WBC: 9.2 10*3/uL (ref 4.0–10.5)
nRBC: 1 % — ABNORMAL HIGH (ref 0.0–0.2)

## 2020-08-01 LAB — COMPREHENSIVE METABOLIC PANEL
ALT: 105 U/L — ABNORMAL HIGH (ref 0–44)
AST: 51 U/L — ABNORMAL HIGH (ref 15–41)
Albumin: 2.4 g/dL — ABNORMAL LOW (ref 3.5–5.0)
Alkaline Phosphatase: 101 U/L (ref 38–126)
Anion gap: 7 (ref 5–15)
BUN: 19 mg/dL (ref 8–23)
CO2: 37 mmol/L — ABNORMAL HIGH (ref 22–32)
Calcium: 8.5 mg/dL — ABNORMAL LOW (ref 8.9–10.3)
Chloride: 93 mmol/L — ABNORMAL LOW (ref 98–111)
Creatinine, Ser: 0.47 mg/dL (ref 0.44–1.00)
GFR, Estimated: >60 mL/min (ref >60)
Glucose, Bld: 200 mg/dL — ABNORMAL HIGH (ref 70–99)
Potassium: 3.9 mmol/L (ref 3.5–5.1)
Sodium: 137 mmol/L (ref 135–145)
Total Bilirubin: 0.7 mg/dL (ref 0.3–1.2)
Total Protein: 4.6 g/dL — ABNORMAL LOW (ref 6.5–8.1)

## 2020-08-01 LAB — PROTIME-INR
INR: 0.9 (ref 0.8–1.2)
Prothrombin Time: 12.2 seconds (ref 11.4–15.2)

## 2020-08-01 LAB — BRAIN NATRIURETIC PEPTIDE: B Natriuretic Peptide: 468.9 pg/mL — ABNORMAL HIGH (ref 0.0–100.0)

## 2020-08-01 LAB — D-DIMER, QUANTITATIVE: D-Dimer, Quant: 3.04 ug/mL-FEU — ABNORMAL HIGH (ref 0.00–0.50)

## 2020-08-01 MED ORDER — FUROSEMIDE 10 MG/ML IJ SOLN
40.0000 mg | Freq: Once | INTRAMUSCULAR | Status: AC
  Administered 2020-08-01: 40 mg via INTRAVENOUS
  Filled 2020-08-01: qty 4

## 2020-08-01 MED ORDER — BENZONATATE 100 MG PO CAPS: 200.0000 mg | ORAL_CAPSULE | Freq: Three times a day (TID) | ORAL | Status: DC | PRN

## 2020-08-01 MED ORDER — ISOSORBIDE MONONITRATE ER 30 MG PO TB24
30.0000 mg | ORAL_TABLET | Freq: Every day | ORAL | Status: DC
  Administered 2020-08-01 – 2020-08-07 (×7): 30 mg via ORAL
  Filled 2020-08-01 (×7): qty 1

## 2020-08-01 MED ORDER — FUROSEMIDE 40 MG PO TABS
40.0000 mg | ORAL_TABLET | Freq: Once | ORAL | Status: AC
  Administered 2020-08-01: 40 mg via ORAL
  Filled 2020-08-01: qty 1

## 2020-08-01 MED ORDER — ALUM & MAG HYDROXIDE-SIMETH 200-200-20 MG/5ML PO SUSP
30.0000 mL | Freq: Two times a day (BID) | ORAL | Status: AC
  Administered 2020-08-01 – 2020-08-02 (×3): 30 mL via ORAL
  Filled 2020-08-01 (×5): qty 30

## 2020-08-01 MED ORDER — ALBUTEROL SULFATE HFA 108 (90 BASE) MCG/ACT IN AERS
2.0000 | INHALATION_SPRAY | Freq: Four times a day (QID) | RESPIRATORY_TRACT | Status: DC | PRN
  Administered 2020-08-01 – 2020-08-03 (×5): 2 via RESPIRATORY_TRACT
  Filled 2020-08-01: qty 6.7

## 2020-08-01 NOTE — Progress Notes (Signed)
PROGRESS NOTE                                                                                                                                                                                                             Patient Demographics:    [name], is a 75 y.o. female, DOB - Dec 13, 1945, WLS:[id number]  Outpatient Primary MD for the patient is Christain Sacramento, MD   Admit date - 07/27/2020   LOS - 5  Chief Complaint  Patient presents with  . Shortness of Breath  . Chest Pain    Started two weeks ago with progression to severe difficulty breathing and "bad cp"       Brief Narrative: Patient is a 75 y.o. female with PMHx of EtOH use, anemia of chronic disease, eczematous dermatitis, COPD on 3 L of oxygen at home, pulmonary hypertension-who presented with worsening cough-shortness of breath-found to have A. fib with RVR.  Started on Cardizem and heparin infusion-subsequently admitted to the hospitalist service.  COVID-19 vaccinated status: Vaccinated but not boosted  Significant Events: 2/12>> Admit to [facility] for COPD exacerbation-A. fib RVR-COVID-19 positive  Significant studies: 2/11>>Chest x-ray: Right> left basilar opacities 2/11>> CT abdomen/pelvis: No acute abdominal/pelvic findings, diffuse/severe fatty infiltration of liver 2/12>> Leg [place] - No DVT 2/13 TTE - IMPRESSIONS  1. Left ventricular ejection fraction, by estimation, is 55 to 60%. The left ventricle has normal function. The left ventricle has no regional wall motion abnormalities. Left ventricular diastolic parameters were normal.  2. Right ventricular systolic function is normal. The right ventricular size is mildly enlarged.  3. The mitral valve is normal in structure. No evidence of mitral valve regurgitation.  4. The aortic valve is tricuspid. Aortic valve regurgitation is not visualized. No aortic stenosis is present.  5. The inferior vena cava is  dilated in size with >50% respiratory variability, suggesting right atrial pressure of 8 mmHg  COVID-19 medications: Steroids: 2/12>>  Antibiotics: Doxycycline: 2/12>>  Microbiology data: None  Procedures: None  Consults: Cardiology  DVT prophylaxis: enoxaparin (LOVENOX) injection 40 mg Start: 07/29/20 1000 SCDs Start: 07/27/20 2311     Subjective:   Patient in bed, appears comfortable, denies any headache, no fever, some epigastric discomfort intermittently, mild shortness of breath ,  No focal weakness.    Assessment  & Plan :   COPD  exacerbation: Probably triggered by COVID-19 infection-now on IV Solu-Medrol and doxycycline, scheduled Xopenex-add Incruse inhaler.  She is stable-moving air-appears comfortable.  Back on her usual 3-4 L of oxygen (home regimen), single dose Lasix on 08/01/2020 as she has some rales on exam.  ?COVID-19 PNA: Possible pneumonic changes in x-ray chest-CRP elevated-however LFTs are significantly elevated.  Will treat with steroids-avoid Remdesivir due to significantly elevated LFTs.  Hypoxemia is at baseline-on usual 3 L of oxygen-no role for immunomodulators at this time.  Recent Labs  Lab 07/27/20 1515 07/27/20 1528 07/27/20 2047 07/27/20 2208 07/28/20 0216 07/28/20 0743 07/29/20 0305 07/29/20 0811 07/30/20 0146 07/31/20 0430 08/01/20 0204  WBC 8.2  --   --   --  7.4  --  8.0  --  9.5 11.7* 9.2  HGB 8.6*  --   --   --  7.8*  --  7.6*  --  7.6* 8.3* 8.2*  HCT 28.1*  --   --   --  23.0*  --  21.9*  --  22.9* 26.5* 26.1*  PLT 119*  --   --   --  118*  --  121*  --  117* 121* 111*  CRP  --   --   --   --   --  8.1* 4.9*  --  2.1* 1.0* 0.7  BNP 104.4*  --   --   --   --   --   --  530.6* 425.0* 561.1* 468.9*  DDIMER  --   --   --   --   --  1.93* 3.01*  --  2.99* 3.14* 3.04*  PROCALCITON  --   --   --   --   --  0.61  --   --   --   --   --   AST 614*  --   --   --  1,154* 938* 467*  --  184* 70* 51*  ALT 166*  --   --   --  268* 270*  236*  --  176* 135* 105*  ALKPHOS 166*  --   --   --  163* 141* 137*  --  118 121 101  BILITOT 1.6*  --   --   --  1.8* 1.6* 0.8  --  0.9 0.8 0.7  ALBUMIN 2.7*  --   --   --  2.7* 2.7* 2.6*  --  2.5* 2.6* 2.4*  INR  --   --   --  1.0  --   --   --  0.9 1.0 0.9 0.9  LATICACIDVEN 6.4*  --  4.0*  --   --   --   --   --   --   --   --   SARSCOV2NAA  --  POSITIVE*  --   --   --   --   --   --   --   --   --       PAF with RVR: Back in sinus rhythm-stop Cardizem infusion-starting short acting Cardizem-on IV heparin currently.  She is very frail-lives alone-alcohol use-has chronic normocytic anemia-I agree with cardiology that she is a poor long-term candidate for anticoagulation.  Probably better to stick with aspirin.  Echo noted, on aspirin only now.  Elevated D-dimer. -ve leg ultrasound, on prophylactic Lovenox, monitor, poor candidate for anticoagulation.    Acute alcoholic hepatitis: LFTs significantly elevated-but seems to be slowly downtrending-discriminant function only 5.  No role for steroids-CT abdomen  with significant fatty infiltration.  Supportive care-avoid hepatotoxic medications.  Follow LFTs  EtOH withdrawal: Tremulous-but awake/alert.  Claims that she drinks at least one 12 ounce beer-along with 3-4 shots of liquor on a daily basis.  Last drink was 2/11.  Continue Ativan per CIWA protocol.  Normocytic anemia: Anemia panel suggestive of anemia of chronic disease, supportive care.  History of pulmonary hypertension: Due to COPD-outpatient follow-up with pulmonary.  Continue oxygen supplementation as needed.  Deconditioning/debility/frailty: Await PT/OT eval.  Depression: Hold bupropion-allow LFTs to improve further before initiating.  Eczematous dermatitis-keratotic lesions in left breast/bilateral lower extremities  Obesity: Estimated body mass index is 20.24 kg/m as calculated from the following:   Height as of this encounter: _0  (1.575 m).   Weight as of this  encounter: 50.2 kg.   RN pressure injury documentation: Pressure Injury 08/19/19 Sacrum Mid Stage 2 -  Partial thickness loss of dermis presenting as a shallow open injury with a red, pink wound bed without slough. (Active)  08/19/19 0200  Location: Sacrum  Location Orientation: Mid  Staging: Stage 2 -  Partial thickness loss of dermis presenting as a shallow open injury with a red, pink wound bed without slough.  Wound Description (Comments):   Present on Admission: Yes    GI prophylaxis: PPI   Condition - Stable  Family Communication  :  Daughter-Donna-[phone] previous MD updated over the phone on 2/12- agreed with avoiding anticoagulation given alcohol use/safety-understands embolic/CVA risk, updated 07/29/2020 essential left at 11:37 AM, 08/01/20 message left at 11:13 AM  Code Status :  Full Code  Diet :  Diet Order            DIET DYS 2 Room service appropriate? Yes; Fluid consistency: Thin  Diet effective now                  Disposition Plan  :   Status is: Inpatient  Remains inpatient appropriate because:Inpatient level of care appropriate due to severity of illness   Dispo: The patient is from: Home              Anticipated d/c is to: SNF              Anticipated d/c date is: > 3 days              Patient currently is not medically stable to d/c.   Difficult to place patient No  Barriers to discharge: COPD exacerbation on IV steroids, COVID-19 infection, alcoholic hepatitis-needs continued inpatient monitoring/treatment.  Antimicorbials  :    Anti-infectives (From admission, onward)   Start     Dose/Rate Route Frequency Ordered Stop   07/28/20 1000  doxycycline (VIBRA-TABS) tablet 100 mg        100 mg Oral Every 12 hours 07/28/20 0813 08/02/20 0559      Inpatient Medications  Scheduled Meds: . alum & mag hydroxide-simeth  30 mL Oral BID  . aspirin  81 mg Oral Daily  . diltiazem  60 mg Oral Q6H  . doxycycline  100 mg Oral Q12H  . enoxaparin  (LOVENOX) injection  40 mg Subcutaneous Q24H  . feeding supplement  237 mL Oral TID BM  . folic acid  1 mg Oral Daily  . isosorbide mononitrate  30 mg Oral Daily  . levalbuterol  2 puff Inhalation Q6H  . mouth rinse  15 mL Mouth Rinse BID  . methylPREDNISolone (SOLU-MEDROL) injection  40 mg Intravenous BID  . multivitamin with minerals  1 tablet Oral Daily  . pantoprazole  40 mg Oral Q1200  . thiamine  100 mg Oral Daily   Or  . thiamine  100 mg Intravenous Daily  . umeclidinium bromide  1 puff Inhalation Daily   Continuous Infusions:  PRN Meds:.acetaminophen, benzonatate, guaiFENesin-dextromethorphan, lip balm, LORazepam **OR** LORazepam, ondansetron (ZOFRAN) IV   Time Spent in minutes  25  See all Orders from today for further details   Lala Lund M.D on 08/01/2020 at 11:11 AM  To page go to [email or web address] - use universal password  [facility] -  Office  [phone]    Objective:   Vitals:   07/31/20 1900 07/31/20 2258 07/31/20 2300 08/01/20 0500  BP: 132/76 133/69 133/69 130/65  Pulse: 82 81 81 80  Resp: _0 Temp: 97.8 F (36.6 C)  98.2 F (36.8 C) 98.1 F (36.7 C)  TempSrc: Axillary  Axillary Axillary  SpO2: 100%  100% 100%  Weight:      Height:        Wt Readings from Last 3 Encounters:  07/30/20 50.2 kg  06/22/20 51.5 kg  02/13/20 50.2 kg     Intake/Output Summary (Last 24 hours) at 08/01/2020 1111 Last data filed at 08/01/2020 1047 Gross per 24 hour  Intake 600 ml  Output 525 ml  Net 75 ml     Physical Exam  Extremely frail elderly white female laying in hospital bed no distress, no focal deficits .AT,PERRAL Supple Neck,No JVD, No cervical lymphadenopathy appriciated.  Symmetrical Chest wall movement, Good air movement bilaterally, few wheezes RRR,No Gallops, Rubs or new Murmurs, No Parasternal Heave +ve B.Sounds, Abd Soft, No tenderness, No organomegaly appriciated, No rebound - guarding or rigidity. chronic diffuse  bilateral upper extremity bruising from previous lab draws   Data Review:    CBC  Recent Labs  Lab 07/27/20 1515 07/28/20 0216 07/29/20 0305 07/30/20 0146 07/31/20 0430 08/01/20 0204  WBC 8.2 7.4 8.0 9.5 11.7* 9.2  HGB 8.6* 7.8* 7.6* 7.6* 8.3* 8.2*  HCT 28.1* 23.0* 21.9* 22.9* 26.5* 26.1*  PLT 119* 118* 121* 117* 121* 111*  MCV 101.8* 95.4 94.4 97.0 99.6 99.6  MCH 31.2 32.4 32.8 32.2 31.2 31.3  MCHC 30.6 33.9 34.7 33.2 31.3 31.4  RDW 18.2* 17.7* 17.7* 18.1* 18.3* 18.5*  LYMPHSABS 1.9  --   --   --   --   --   MONOABS 1.6*  --   --   --   --   --   EOSABS 0.0  --   --   --   --   --   BASOSABS 0.1  --   --   --   --   --     Chemistries  Recent Labs  Lab 07/28/20 0216 07/28/20 0743 07/29/20 0305 07/30/20 0146 07/31/20 0430 08/01/20 0204  NA 141 144 140 138 138 137  K 4.3 4.3 4.0 3.8 3.8 3.9  CL 93* 93* 93* 91* 93* 93*  CO2 28 34* 35* 38* 38* 37*  GLUCOSE 215* 147* 219* 225* 195* 200*  BUN _1 CREATININE 0.92 0.76 0.55 0.51 0.47 0.47  CALCIUM 7.9* 8.2* 8.5* 8.5* 8.5* 8.5*  MG 1.5*  --  2.3 2.1 1.9 1.9  AST 1,154* 938* 467* 184* 70* 51*  ALT 268* 270* 236* 176* 135* 105*  ALKPHOS 163* 141* 137* 118 121 101  BILITOT 1.8* 1.6* 0.8 0.9 0.8 0.7   ------------------------------------------------------------------------------------------------------------------ No results for  input(s): CHOL, HDL, LDLCALC, TRIG, CHOLHDL, LDLDIRECT in the last 72 hours.  Lab Results  Component Value Date   HGBA1C 6.1 (H) 05/13/2018   ------------------------------------------------------------------------------------------------------------------ No results for input(s): TSH, T4TOTAL, T3FREE, THYROIDAB in the last 72 hours.  Invalid input(s): FREET3 ------------------------------------------------------------------------------------------------------------------ No results for input(s): VITAMINB12, FOLATE, FERRITIN, TIBC, IRON, RETICCTPCT in the last 72  hours.  Coagulation profile Recent Labs  Lab 07/27/20 2208 07/29/20 0811 07/30/20 0146 07/31/20 0430 08/01/20 0204  INR 1.0 0.9 1.0 0.9 0.9    Recent Labs    07/31/20 0430 08/01/20 0204  DDIMER 3.14* 3.04*    Cardiac Enzymes No results for input(s): CKMB, TROPONINI, MYOGLOBIN in the last 168 hours.  Invalid input(s): CK ------------------------------------------------------------------------------------------------------------------    Component Value Date/Time   BNP 468.9 (H) 08/01/2020 0204    Micro Results Recent Results (from the past 240 hour(s))  Resp Panel by RT-PCR (Flu A&B, Covid) Nasopharyngeal Swab     Status: Abnormal   Collection Time: 07/27/20  3:28 PM   Specimen: Nasopharyngeal Swab; Nasopharyngeal(NP) swabs in vial transport medium  Result Value Ref Range Status   SARS Coronavirus 2 by RT PCR POSITIVE (A) NEGATIVE Final    Comment: RESULT CALLED TO, READ BACK BY AND VERIFIED WITH: Wyvonna Plum RN [id number] 07/27/20 A BROWNING (NOTE) SARS-CoV-2 target nucleic acids are DETECTED.  The SARS-CoV-2 RNA is generally detectable in upper respiratory specimens during the acute phase of infection. Positive results are indicative of the presence of the identified virus, but do not rule out bacterial infection or co-infection with other pathogens not detected by the test. Clinical correlation with patient history and other diagnostic information is necessary to determine patient infection status. The expected result is Negative.  Fact Sheet for Patients: EntrepreneurPulse.com.au  Fact Sheet for Healthcare Providers: IncredibleEmployment.be  This test is not yet approved or cleared by the [place] FDA and  has been authorized for detection and/or diagnosis of SARS-CoV-2 by FDA under an Emergency Use Authorization (EUA).  This EUA will remain in effect (meaning this test can  be used) for the duration of  the COVID-19  declaration under Section 564(b)(1) of the Act, 21 U.S.C. section 360bbb-3(b)(1), unless the authorization is terminated or revoked sooner.     Influenza A by PCR NEGATIVE NEGATIVE Final   Influenza B by PCR NEGATIVE NEGATIVE Final    Comment: (NOTE) The Xpert Xpress SARS-CoV-2/FLU/RSV plus assay is intended as an aid in the diagnosis of influenza from Nasopharyngeal swab specimens and should not be used as a sole basis for treatment. Nasal washings and aspirates are unacceptable for Xpert Xpress SARS-CoV-2/FLU/RSV testing.  Fact Sheet for Patients: EntrepreneurPulse.com.au  Fact Sheet for Healthcare Providers: IncredibleEmployment.be  This test is not yet approved or cleared by the [place] FDA and has been authorized for detection and/or diagnosis of SARS-CoV-2 by FDA under an Emergency Use Authorization (EUA). This EUA will remain in effect (meaning this test can be used) for the duration of the COVID-19 declaration under Section 564(b)(1) of the Act, 21 U.S.C. section 360bbb-3(b)(1), unless the authorization is terminated or revoked.  Performed at [facility], [place] [address], [place], [place] [address]     Radiology Reports CT ABDOMEN PELVIS W CONTRAST  Result Date: 07/27/2020 CLINICAL DATA:  Elevated liver function studies. EXAM: CT ABDOMEN AND PELVIS WITH CONTRAST TECHNIQUE: Multidetector CT imaging of the abdomen and pelvis was performed using the standard protocol following bolus administration of intravenous contrast. CONTRAST:  51m OMNIPAQUE IOHEXOL 300  MG/ML  SOLN COMPARISON:  None. FINDINGS: Lower chest: Moderate breathing motion artifact. There are emphysematous changes and pulmonary scarring but no definite infiltrates or effusions. The heart is within normal limits in size. Moderate aortic calcifications. Hepatobiliary: Diffuse and severe fatty infiltration of the liver with focal areas of more pronounced fatty change.  No worrisome hepatic lesions or intrahepatic biliary dilatation. The gallbladder is unremarkable. No common bile duct dilatation. Pancreas: Advanced pancreatic atrophy but no mass, inflammation or ductal dilatation. Spleen: Normal size.  No focal lesions. Adrenals/Urinary Tract: Mild nodularity of both adrenal glands but no worrisome lesions. Scattered low-attenuation renal lesions are likely benign cysts. No worrisome renal lesions or hydronephrosis. 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 The bladder is unremarkable. Stomach/Bowel: The stomach, duodenum, small bowel and colon are grossly normal. Vascular/Lymphatic: Advanced atherosclerotic calcifications involving the aorta and branch vessels but no aneurysm. The major venous structures are patent. No mesenteric or retroperitoneal mass or adenopathy. Small scattered lymph nodes are noted. Reproductive: The uterus and ovaries are unremarkable. Other: No pelvic mass or adenopathy. No free pelvic fluid collections. No inguinal mass or adenopathy. No abdominal wall hernia or subcutaneous lesions. Musculoskeletal: No significant bony findings. IMPRESSION: 1. Diffuse and severe fatty infiltration of the liver. 2. No acute abdominal/pelvic findings, mass lesions or adenopathy. 3. Advanced atherosclerotic calcifications involving the aorta and branch vessels. 4. 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 5. Emphysematous changes and pulmonary scarring at the lung bases. 6. Emphysema and aortic atherosclerosis. Aortic Atherosclerosis (ICD10-I70.0) and Emphysema (ICD10-J43.9). Electronically Signed   By: Marijo Sanes M.D.   On: 07/27/2020 19:46   DG Chest Port 1 View  Result Date: 08/01/2020 CLINICAL DATA:  Shortness of breath.  COVID. EXAM: PORTABLE CHEST 1 VIEW COMPARISON:  CT 01/15/2020.  Chest x-ray 08/18/2019, 05/11/2018. FINDINGS: Mediastinum and hilar structures are normal. COPD. Chronic bilateral  interstitial changes are noted. Bibasilar subsegmental atelectasis and or scarring again noted. No acute infiltrate noted. Previously noted pulmonary nodules on CT of 01/16/2020 best evaluated by prior CT. No pleural effusion or pneumothorax. IMPRESSION: 1. COPD. Chronic bilateral interstitial changes and bibasilar subsegmental atelectasis and or scarring. 2. Previously noted pulmonary nodules noted on CT of 01/16/2020 best evaluated by CT. Reference is made to that report. Electronically Signed   By: Marcello Moores  Register   On: 08/01/2020 08:21   DG Chest Portable 1 View  Result Date: 07/27/2020 CLINICAL DATA:  SOB EXAM: PORTABLE CHEST 1 VIEW COMPARISON:  01/16/2020 and prior. FINDINGS: Emphysematous changes. Patchy right predominant basilar opacities. No pneumothorax or pleural effusion. Cardiomediastinal silhouette within normal limits. IMPRESSION: Right predominant basilar opacities, atelectasis versus infiltrate. Emphysema. Electronically Signed   By: Primitivo Gauze M.D.   On: 07/27/2020 15:25   VAS [place] LOWER EXTREMITY VENOUS (DVT)  Result Date: 07/30/2020  Lower Venous DVT Study Indications: Covid, d-dimer.  Anticoagulation: Lovenox. Limitations: Poor ultrasound/tissue interface due to superficial skin changes. Comparison Study: 03-20-2020 Prior LT lower extremity venous was negative for                   DVT. Performing Technologist: Darlin Coco RDMS  Examination Guidelines: A complete evaluation includes B-mode imaging, spectral Doppler, color Doppler, and power Doppler as needed of all accessible portions of each vessel. Bilateral testing is considered an integral part of a complete examination. Limited examinations for reoccurring indications may be performed as noted. The reflux portion of the exam is performed with the patient  in reverse Trendelenburg.  +---------+---------------+---------+-----------+----------+-------------------+ RIGHT     CompressibilityPhasicitySpontaneityPropertiesThrombus Aging      +---------+---------------+---------+-----------+----------+-------------------+ CFV      Full           Yes      Yes                                      +---------+---------------+---------+-----------+----------+-------------------+ SFJ      Full                                                             +---------+---------------+---------+-----------+----------+-------------------+ FV Prox  Full                                                             +---------+---------------+---------+-----------+----------+-------------------+ FV Mid   Full                                                             +---------+---------------+---------+-----------+----------+-------------------+ FV DistalFull                                                             +---------+---------------+---------+-----------+----------+-------------------+ PFV      Full                                                             +---------+---------------+---------+-----------+----------+-------------------+ POP      Full           Yes      Yes                                      +---------+---------------+---------+-----------+----------+-------------------+ PTV      Full                                         Some segments not                                                         well visualized     +---------+---------------+---------+-----------+----------+-------------------+ PERO     Full  Some segments not                                                         well visualized     +---------+---------------+---------+-----------+----------+-------------------+   +---------+---------------+---------+-----------+----------+-------------------+ LEFT     CompressibilityPhasicitySpontaneityPropertiesThrombus Aging       +---------+---------------+---------+-----------+----------+-------------------+ CFV      Full           Yes      Yes                                      +---------+---------------+---------+-----------+----------+-------------------+ SFJ      Full                                                             +---------+---------------+---------+-----------+----------+-------------------+ FV Prox  Full                                                             +---------+---------------+---------+-----------+----------+-------------------+ FV Mid   Full                                                             +---------+---------------+---------+-----------+----------+-------------------+ FV DistalFull                                                             +---------+---------------+---------+-----------+----------+-------------------+ PFV      Full                                                             +---------+---------------+---------+-----------+----------+-------------------+ POP      Full           Yes      Yes                                      +---------+---------------+---------+-----------+----------+-------------------+ PTV      Full                                         Some segments not  well visualized     +---------+---------------+---------+-----------+----------+-------------------+ PERO     Full                                         Some segments not                                                         well visualized     +---------+---------------+---------+-----------+----------+-------------------+     Summary: RIGHT: - There is no evidence of deep vein thrombosis in the lower extremity. However, portions of this examination were limited- see technologist comments above.  - No cystic structure found in the popliteal fossa.  LEFT: - There is  no evidence of deep vein thrombosis in the lower extremity. However, portions of this examination were limited- see technologist comments above.  - No cystic structure found in the popliteal fossa.  *See table(s) above for measurements and observations. Electronically signed by Ruta Hinds MD on 07/30/2020 at 4:19:16 PM.    Final    ECHOCARDIOGRAM LIMITED  Result Date: 07/28/2020    ECHOCARDIOGRAM LIMITED REPORT   Patient Name:   KYM SCANNELL [facility] Date of Exam: 07/28/2020 Medical Rec #:  [id number]       Height:       62.0 in Accession #:    [id number]      Weight:       108.0 lb Date of Birth:  Jul 30, 1945       BSA:          1.471 m Patient Age:    77 years        BP:           114/78 mmHg Patient Gender: F               HR:           83 bpm. Exam Location:  Inpatient Procedure: Limited Echo, Limited Color Doppler and Cardiac Doppler Indications:    atrial fibrillation  History:        Patient has no prior history of Echocardiogram examinations.                 Covid and COPD; Risk Factors:Hypertension.  Sonographer:    Johny Chess Referring Phys: [place]  1. Left ventricular ejection fraction, by estimation, is 55 to 60%. The left ventricle has normal function. The left ventricle has no regional wall motion abnormalities. Left ventricular diastolic parameters were normal.  2. Right ventricular systolic function is normal. The right ventricular size is mildly enlarged.  3. The mitral valve is normal in structure. No evidence of mitral valve regurgitation.  4. The aortic valve is tricuspid. Aortic valve regurgitation is not visualized. No aortic stenosis is present.  5. The inferior vena cava is dilated in size with >50% respiratory variability, suggesting right atrial pressure of 8 mmHg. Comparison(s): No prior Echocardiogram. FINDINGS  Left Ventricle: Left ventricular ejection fraction, by estimation, is 55 to 60%. The left ventricle has normal function. The left ventricle has no  regional wall motion abnormalities. The left ventricular internal cavity size was normal in size. There is  no left ventricular hypertrophy. Left ventricular diastolic parameters were normal. Right Ventricle: The  right ventricular size is mildly enlarged. Right ventricular systolic function is normal. Mitral Valve: The mitral valve is normal in structure. Tricuspid Valve: The tricuspid valve is normal in structure. Tricuspid valve regurgitation is not demonstrated. Aortic Valve: The aortic valve is tricuspid. Aortic valve regurgitation is not visualized. No aortic stenosis is present. Aorta: The aortic root is normal in size and structure and the ascending aorta was not well visualized. Venous: The inferior vena cava is dilated in size with greater than 50% respiratory variability, suggesting right atrial pressure of 8 mmHg. LEFT VENTRICLE PLAX 2D LVIDd:         4.40 cm  Diastology LVIDs:         2.50 cm  LV e' medial:    10.60 cm/s LV PW:         0.80 cm  LV E/e' medial:  7.9 LV IVS:        0.70 cm  LV e' lateral:   11.50 cm/s LVOT diam:     1.90 cm  LV E/e' lateral: 7.3 LV SV:         66 LV SV Index:   45 LVOT Area:     2.84 cm  IVC IVC diam: 2.40 cm LEFT ATRIUM         Index LA diam:    3.70 cm 2.51 cm/m  AORTIC VALVE LVOT Vmax:   137.00 cm/s LVOT Vmean:  87.500 cm/s LVOT VTI:    0.233 m  AORTA Ao Root diam: 2.90 cm MITRAL VALVE MV Area (PHT): 4.10 cm    SHUNTS MV Decel Time: 185 msec    Systemic VTI:  0.23 m MV E velocity: 83.60 cm/s  Systemic Diam: 1.90 cm MV A velocity: 71.10 cm/s MV E/A ratio:  1.18 Rudean Haskell MD Electronically signed by Rudean Haskell MD Signature Date/Time: 07/28/2020/5:07:15 PM    Final

## 2020-08-01 NOTE — Progress Notes (Signed)
Occupational Therapy Treatment Patient Details Name: [name] MRN: [id number] DOB: 1946/01/04 Today's Date: 08/01/2020    History of present illness Pt is a 75 y.o. female admitted 07/27/20 with worsening cough and SOB; tested (+) COVID-19. Workup for afib with RVR, COPD exacerbation, potential COVID-19 PNA. PMH includes COPD (wears 3L O2 baseline), pulmonary HTN, ETOH use, anemia, exzematous dermatitis.   OT comments  Pt fatigued after PT.  She was instructed in initial HEP with theraband, and encouraged to perform 2-3 reps 6-8 x/day to build endurance.  Also instructed her in use of IS and encouraged her to do 10 reps every hour.  Will continue to follow.  Follow Up Recommendations  SNF;Supervision/Assistance - 24 hour    Equipment Recommendations  3 in 1 bedside commode    Recommendations for Other Services      Precautions / Restrictions Precautions Precautions: Fall;Other (comment) Precaution Comments: H/o COPD wears 3-4L O2 baseline       Mobility Bed Mobility               General bed mobility comments: Pt fatigued  Transfers                      Balance                                           ADL either performed or assessed with clinical judgement   ADL                                         General ADL Comments: Pt fatigued     Vision       Perception     Praxis      Cognition Arousal/Alertness: Awake/alert Behavior During Therapy: WFL for tasks assessed/performed Overall Cognitive Status: No family/caregiver present to determine baseline cognitive functioning                                 General Comments: Pt repeats info.  Requires redirection to task        Exercises Exercises: Other exercises Other Exercises Other Exercises: Pt instructed in use of IS and was able to perform x 10 with encouragement pulling 250-350 Other Exercises: Pt instructed in initial HEP for  UEs - level one theraband.  Horizontal abduction bil. x 10   Shoulder Instructions       General Comments Pt fatigued after PT.    Pertinent Vitals/ Pain       Pain Assessment: No/denies pain  Home Living                                          Prior Functioning/Environment              Frequency  Min 2X/week        Progress Toward Goals  OT Goals(current goals can now be found in the care plan section)  Progress towards OT goals: Not progressing toward goals - comment (due to pt fatigue)     Plan Discharge plan remains appropriate    Co-evaluation  AM-PAC OT "6 Clicks" Daily Activity     Outcome Measure   Help from another person eating meals?: A Little Help from another person taking care of personal grooming?: A Little Help from another person toileting, which includes using toliet, bedpan, or urinal?: A Lot Help from another person bathing (including washing, rinsing, drying)?: A Lot Help from another person to put on and taking off regular upper body clothing?: A Little Help from another person to put on and taking off regular lower body clothing?: A Lot 6 Click Score: 15    End of Session    OT Visit Diagnosis: Unsteadiness on feet (R26.81);Other abnormalities of gait and mobility (R26.89);Muscle weakness (generalized) (M62.81)   Activity Tolerance Patient limited by fatigue   Patient Left in bed;with call bell/phone within reach   Nurse Communication Mobility status        Time: 6837-2902 OT Time Calculation (min): 20 min  Charges: OT General Charges $OT Visit: 1 Visit OT Treatments $Therapeutic Activity: 8-22 mins  Nilsa Nutting., OTR/L Acute Rehabilitation Services Pager [phone] Office [phone]    Lucille Passy M 08/01/2020, 5:59 PM

## 2020-08-02 DIAGNOSIS — R0902 Hypoxemia: Secondary | ICD-10-CM | POA: Diagnosis not present

## 2020-08-02 DIAGNOSIS — Z7189 Other specified counseling: Secondary | ICD-10-CM

## 2020-08-02 DIAGNOSIS — U071 COVID-19: Principal | ICD-10-CM

## 2020-08-02 DIAGNOSIS — Z515 Encounter for palliative care: Secondary | ICD-10-CM

## 2020-08-02 DIAGNOSIS — I4891 Unspecified atrial fibrillation: Secondary | ICD-10-CM | POA: Diagnosis not present

## 2020-08-02 DIAGNOSIS — J441 Chronic obstructive pulmonary disease with (acute) exacerbation: Secondary | ICD-10-CM | POA: Diagnosis not present

## 2020-08-02 LAB — COMPREHENSIVE METABOLIC PANEL
ALT: 104 U/L — ABNORMAL HIGH (ref 0–44)
AST: 50 U/L — ABNORMAL HIGH (ref 15–41)
Albumin: 2.6 g/dL — ABNORMAL LOW (ref 3.5–5.0)
Alkaline Phosphatase: 99 U/L (ref 38–126)
Anion gap: 9 (ref 5–15)
BUN: 27 mg/dL — ABNORMAL HIGH (ref 8–23)
CO2: 39 mmol/L — ABNORMAL HIGH (ref 22–32)
Calcium: 8.6 mg/dL — ABNORMAL LOW (ref 8.9–10.3)
Chloride: 89 mmol/L — ABNORMAL LOW (ref 98–111)
Creatinine, Ser: 0.9 mg/dL (ref 0.44–1.00)
GFR, Estimated: >60 mL/min (ref >60)
Glucose, Bld: 224 mg/dL — ABNORMAL HIGH (ref 70–99)
Potassium: 3.7 mmol/L (ref 3.5–5.1)
Sodium: 137 mmol/L (ref 135–145)
Total Bilirubin: 0.6 mg/dL (ref 0.3–1.2)
Total Protein: 5 g/dL — ABNORMAL LOW (ref 6.5–8.1)

## 2020-08-02 LAB — MAGNESIUM: Magnesium: 2.1 mg/dL (ref 1.7–2.4)

## 2020-08-02 LAB — CBC
HCT: 26.6 % — ABNORMAL LOW (ref 36.0–46.0)
Hemoglobin: 8.5 g/dL — ABNORMAL LOW (ref 12.0–15.0)
MCH: 31.5 pg (ref 26.0–34.0)
MCHC: 32 g/dL (ref 30.0–36.0)
MCV: 98.5 fL (ref 80.0–100.0)
Platelets: 124 10*3/uL — ABNORMAL LOW (ref 150–400)
RBC: 2.7 MIL/uL — ABNORMAL LOW (ref 3.87–5.11)
RDW: 18.7 % — ABNORMAL HIGH (ref 11.5–15.5)
WBC: 11 10*3/uL — ABNORMAL HIGH (ref 4.0–10.5)
nRBC: 0.5 % — ABNORMAL HIGH (ref 0.0–0.2)

## 2020-08-02 LAB — C-REACTIVE PROTEIN: CRP: 0.6 mg/dL (ref <1.0)

## 2020-08-02 LAB — PROTIME-INR
INR: 1 (ref 0.8–1.2)
Prothrombin Time: 12.5 seconds (ref 11.4–15.2)

## 2020-08-02 LAB — BRAIN NATRIURETIC PEPTIDE: B Natriuretic Peptide: 236.8 pg/mL — ABNORMAL HIGH (ref 0.0–100.0)

## 2020-08-02 MED ORDER — FUROSEMIDE 10 MG/ML IJ SOLN
40.0000 mg | Freq: Once | INTRAMUSCULAR | Status: AC
  Administered 2020-08-02: 40 mg via INTRAVENOUS
  Filled 2020-08-02: qty 4

## 2020-08-02 MED ORDER — MOMETASONE FURO-FORMOTEROL FUM 200-5 MCG/ACT IN AERO
2.0000 | INHALATION_SPRAY | Freq: Two times a day (BID) | RESPIRATORY_TRACT | Status: DC
  Administered 2020-08-02 – 2020-08-07 (×11): 2 via RESPIRATORY_TRACT
  Filled 2020-08-02: qty 8.8

## 2020-08-02 MED ORDER — LEVALBUTEROL TARTRATE 45 MCG/ACT IN AERO
2.0000 | INHALATION_SPRAY | Freq: Four times a day (QID) | RESPIRATORY_TRACT | Status: DC
  Administered 2020-08-02 – 2020-08-05 (×11): 2 via RESPIRATORY_TRACT
  Filled 2020-08-02: qty 15

## 2020-08-02 MED ORDER — BUSPIRONE HCL 5 MG PO TABS
5.0000 mg | ORAL_TABLET | Freq: Two times a day (BID) | ORAL | Status: DC
  Administered 2020-08-02 – 2020-08-07 (×10): 5 mg via ORAL
  Filled 2020-08-02 (×10): qty 1

## 2020-08-02 NOTE — Consult Note (Signed)
Consultation Note Date: 08/02/2020   Patient Name: [name]  DOB: 18-Jul-1945  MRN: [id number]  Age / Sex: 75 y.o., female  PCP: Christain Sacramento, MD Referring Physician: Thurnell Lose, MD  Reason for Consultation: Establishing goals of care  HPI/Patient Profile: 75 y.o. female  with past medical history of advanced COPD on 3L oxygen at home, atrial fibrillation, failure to thrive, severe protein calorie malnutrition, pulmonary hypertension, and CAD.  She was brought in the emergency department on 07/27/2020 with shortness of breath and weakness. Associated symptoms included palpitations and worsening productive cough.  ED Course: Found to be in A-fib with RVR. COVID-19 positive. Chest x-ray showing right predominant basilar opacities with atelectasis versus infiltrates. CT abdomen pelvis showed diffuse and severely fatty infiltration of the liver. Patient is tremulous and has a history of alcohol abuse. Patient was admitted to [facility] for further management of a-fib RVR, acute on chronic hypoxic respiratory failure, and alcohol withdraw.   Clinical Assessment and Goals of Care: I have reviewed medical records including EPIC notes, labs and imaging, and met at bedside with patient  to discuss diagnosis, prognosis, GOC, EOL wishes, disposition, and options.  I introduced Palliative Medicine as specialized medical care for people living with serious illness. It focuses on providing relief from the symptoms and stress of a serious illness.   We discussed a brief life review of the patient. She is originally from [place] but has lived in [place] for a long time. She worked as a Museum/gallery curator for many years. She is widowed. She has 1 daughter Butch Penny), and shares they do not have a great relationship.   As far as functional status, she reports her dyspnea has become progressively worse over the past few months. Which has  limited her activity. She also reports poor appetite.     We discussed her current illness and what it means in the larger context of her ongoing co-morbidities.  Natural disease trajectory of advanced COPD was discussed. Patient states "I know my lungs are shot".   I attempted to elicit values and goals of care important to the patient. She expresses wanting to "get well" but then states "I don't think that's possible.   The difference between aggressive medical intervention and comfort care was considered in light of the patient's goals of care.  I introduced the concept of a comfort path to patient and encouraged her to think about at what point she would want to stop aggressive medical interventions and focus on feeling as good as she can for as long as possible with the goal of comfort rather than cure/prolonging life. I recommended home hospice as a way to provide support at home, manage her symptoms (dyspnea), and promote her quality of life.   We did discuss code status. Encouraged patient to consider DNR/DNI status understanding evidenced based poor outcomes in similar hospitalized patients, as the cause of the arrest is likely associated with chronic/terminal disease rather than a reversible acute cardio-pulmonary event. She indicates that she would  agree that DNR is appropriate.   Patient states she would want her friend Elige Radon to make medical decisions for her is she was unable to do so. She states she would not want daughter Butch Penny to make these decisions.   16:30--I spoke with friend Becky Sax by phone. Becky Sax shares that she and the patient have been friends for 58 years. She is willing to be her health care agent if needed. She shares that she has been patient's caregiver for several years now, and that patient's daughter is not supportive.  I discussed with Sonja the possible disposition options--home health with PT, SNF for rehab, or home with hospice. Becky Sax states that patient has  done rehab before and it was helpful. She also states she supports whatever decision the patient makes, and if she wants to come home she will help ensure she is cared for. Sonja lives 3.5 miles away from patient, and there is also a friend currently staying at the patient's house that can help.   Primary decision maker: Patient    SUMMARY OF RECOMMENDATIONS    Code status changed to DNR/DNI  Continue current medical care  Spiritual care consult to assist with HCPOA document - patient wants friend Becky Sax as her HCPOA  Patient trying to decide between disposition options - home with hospice would be very appropriate considering her advanced COPD and need for symptom management  PMT will continue to follow  Code Status/Advance Care Planning:  DNR  Symptom Management:   Start buspirone (BUSPAR) 5 mg BID for anxiety  Palliative Prophylaxis:   Aspiration and Frequent Pain Assessment  Additional Recommendations (Limitations, Scope, Preferences):  Full Scope Treatment  Psycho-social/Spiritual:   Created space and opportunity for patient and to express thoughts and feelings regarding patient's current medical situation.   Emotional support provided   Prognosis:   < 6 months  Discharge Planning: To Be Determined      Primary Diagnoses: Present on Admission: . New onset a-fib ([place]) . Chronic respiratory failure with hypoxia ([place]) . COPD with acute exacerbation ([place]) . Essential hypertension . Protein-calorie malnutrition, severe . COVID-19 virus infection   I have reviewed the medical record, interviewed the patient and family, and examined the patient. The following aspects are pertinent.  Past Medical History:  Diagnosis Date  . Abrasion    under left knee x 2 places changing dressing q day of bid, applying ssd cream area healing due to fell 2 weeks ago  . Blood dyscrasia    BLEEDS EASY  . Bruises easily    bleeds easily  . Closed patellar sleeve fracture of  left knee 01/04/2018  . Closed patellar sleeve fracture of right knee 01/04/2018   healing   . COPD (chronic obstructive pulmonary disease) ([place])   . Coronary artery disease    per dr Philis Kendall note 01-01-18 note  . Hernia, inguinal    LEFT  . Hypertension    states under control with meds., has been on med. > 10 yr.  . On home oxygen therapy    at night 2L/min  . Pneumonia    "SEVERAL TIMES IN PAST, LAST TIME 2018"  . Pulmonary hypertension ([place])    per dr Terrence Dupont 7-19 19 lov note  . Shortness of breath dyspnea    with exertion  . SVD (spontaneous vaginal delivery)    x 1  . Thin skin   . Urinary tract infection 2019  . Wears partial dentures    lower partial and upper  plate    Family History  Problem Relation Age of Onset  . Heart disease Father   . Kidney failure Father   . Colon cancer Neg Hx   . Esophageal cancer Neg Hx    Scheduled Meds: . alum & mag hydroxide-simeth  30 mL Oral BID  . aspirin  81 mg Oral Daily  . diltiazem  60 mg Oral Q6H  . enoxaparin (LOVENOX) injection  40 mg Subcutaneous Q24H  . feeding supplement  237 mL Oral TID BM  . folic acid  1 mg Oral Daily  . isosorbide mononitrate  30 mg Oral Daily  . levalbuterol  2 puff Inhalation Q6H  . mouth rinse  15 mL Mouth Rinse BID  . methylPREDNISolone (SOLU-MEDROL) injection  40 mg Intravenous BID  . mometasone-formoterol  2 puff Inhalation BID  . multivitamin with minerals  1 tablet Oral Daily  . pantoprazole  40 mg Oral Q1200  . thiamine  100 mg Oral Daily   Or  . thiamine  100 mg Intravenous Daily  . umeclidinium bromide  1 puff Inhalation Daily   Continuous Infusions: PRN Meds:.acetaminophen, albuterol, benzonatate, guaiFENesin-dextromethorphan, lip balm, ondansetron (ZOFRAN) IV Medications Prior to Admission:  Prior to Admission medications   Medication Sig Start Date End Date Taking? Authorizing Provider  albuterol (PROVENTIL) (2.5 MG/3ML) 0.083% nebulizer solution Take 3 mLs (2.5 mg  total) by nebulization every 6 (six) hours as needed for wheezing or shortness of breath. 06/27/20  Yes Tanda Rockers, MD  albuterol (VENTOLIN HFA) 108 (90 Base) MCG/ACT inhaler Inhale 2 puffs into the lungs every 4 (four) hours as needed for wheezing or shortness of breath. 05/07/20  Yes Tanda Rockers, MD  B Complex-C (B-COMPLEX WITH VITAMIN C) tablet Take 1 tablet by mouth daily.   Yes [provider]  budesonide (PULMICORT) 0.5 MG/2ML nebulizer solution Take 2 mLs (0.5 mg total) by nebulization in the morning and at bedtime. DX: J44.9 11/28/19  Yes Tanda Rockers, MD  buPROPion [facility] SR) 150 MG 12 hr tablet Take 150 mg by mouth daily.  05/26/17  Yes [provider]  diltiazem (CARDIZEM CD) 360 MG 24 hr capsule Take 360 mg by mouth daily.  12/05/17  Yes [provider]  furosemide (LASIX) 20 MG tablet Take 20 mg by mouth daily.   Yes [provider]  Garlic 993 MG TABS Take 1 tablet by mouth daily.   Yes [provider]  HYDROcodone-acetaminophen (NORCO) 7.5-325 MG tablet Take 1 tablet by mouth every 6 (six) hours as needed for moderate pain. 08/03/19  Yes [provider]  hydrOXYzine (ATARAX/VISTARIL) 25 MG tablet Take 12.5-25 mg by mouth 3 (three) times daily as needed for nausea. 08/03/19  Yes [provider]  levofloxacin (LEVAQUIN) 500 MG tablet Take 500 mg by mouth daily. 07/13/20  Yes [provider]  losartan (COZAAR) 25 MG tablet Take 1 tablet (25 mg total) by mouth daily. Patient taking differently: Take 50 mg by mouth daily. 08/24/19  Yes Harold Hedge, MD  Multiple Minerals-Vitamins (CAL MAG ZINC +D3 PO) Take 1 tablet by mouth at bedtime.   Yes [provider]  ondansetron (ZOFRAN) 4 MG tablet Take 2-4 mg by mouth every 6 (six) hours as needed for nausea/vomiting. 07/21/19  Yes [provider]  OXYGEN Inhale 4 L into the lungs. 24/7 DME- [facility]   Yes [provider]  potassium  chloride (K-DUR) 10 MEQ tablet Take 10 mEq by mouth daily. When takes  lasix 10/13/17  Yes [provider]  revefenacin (YUPELRI) 175 MCG/3ML nebulizer solution Take 3 mLs (175 mcg total) by nebulization daily. DX: J44.9 11/28/19  Yes Tanda Rockers, MD  spironolactone (ALDACTONE) 25 MG tablet Take 1 tablet by mouth 2 (two) times daily. 06/03/18  Yes [provider]  triamcinolone (KENALOG) 0.1 % Apply 1 application topically daily as needed (skin irritation on legs).   Yes [provider]  arformoterol (BROVANA) 15 MCG/2ML NEBU Take 2 mLs (15 mcg total) by nebulization 2 (two) times daily. Patient not taking: No sig reported 10/31/19   Martyn Ehrich, NP  azithromycin (ZITHROMAX) 250 MG tablet Take 2 on day one then 1 daily x 4 days Patient not taking: No sig reported 06/22/20   Tanda Rockers, MD  formoterol (PERFOROMIST) 20 MCG/2ML nebulizer solution Take 2 mLs (20 mcg total) by nebulization 2 (two) times daily. DX: J44.9 Patient not taking: No sig reported 11/28/19   Tanda Rockers, MD  lidocaine (LIDODERM) 5 % Place 1 patch onto the skin daily as needed. Apply patch to area most significant pain once per day.  Remove and discard patch within 12 hours of application. Patient not taking: No sig reported 01/16/20   Petrucelli, Samantha R, PA-C  predniSONE (DELTASONE) 10 MG tablet Take  4 each am x 2 days,   2 each am x 2 days,  1 each am x 2 days and stop Patient not taking: No sig reported 06/22/20   Tanda Rockers, MD  predniSONE (DELTASONE) 10 MG tablet Take 4 tabs by mouth for 3 days, then 3 for 3 days, 2 for 3 days, 1 for 3 days and stop Patient not taking: No sig reported 06/27/20   Tanda Rockers, MD  Respiratory Therapy Supplies (FLUTTER) DEVI Use as directed 02/05/16   Tanda Rockers, MD   Allergies  Allergen Reactions  . Tramadol Other (See Comments)    GI UPSET   Review of Systems  Unable to perform ROS Respiratory: Positive for shortness of breath.    Neurological: Positive for weakness.  Psychiatric/Behavioral: The patient is nervous/anxious.     Physical Exam Constitutional:      General: She is not in acute distress.    Appearance: She is ill-appearing.  Cardiovascular:     Rate and Rhythm: Tachycardia present.  Pulmonary:     Effort: Accessory muscle usage present.     Comments: Mildly labored breathing at rest Neurological:     Mental Status: She is alert and oriented to person, place, and time.     Motor: Weakness present.  Psychiatric:        Mood and Affect: Mood is anxious.     Vital Signs: BP 135/62 (BP Location: Right Arm)   Pulse 97   Temp 98.3 F (36.8 C) (Axillary)   Resp 20   Ht 5' 2" (1.575 m)   Wt 50.2 kg   SpO2 97%   BMI 20.24 kg/m  Pain Scale: 0-10   Pain Score: 0-No pain   SpO2: SpO2: 97 % O2 Device:SpO2: 97 % O2 Flow Rate: .O2 Flow Rate (L/min): 5 L/min  IO: Intake/output summary:   Intake/Output Summary (Last 24 hours) at 08/02/2020 1442 Last data filed at 08/02/2020 1227 Gross per 24 hour  Intake 150 ml  Output 850 ml  Net -700 ml    LBM: Last BM Date: 08/02/20 Baseline Weight: Weight: 49 kg Most recent weight: Weight: 50.2 kg  Palliative Assessment/Data: PPS 40%     Time In: 15:00 Time Out: 16:15 Time Total: 75 minutes Greater than 50%  of this time was spent counseling and coordinating care related to the above assessment and plan.  Signed by: Lavena Bullion, NP   Please contact Palliative Medicine Team phone at [phone] for questions and concerns.  For individual provider: See Shea Evans

## 2020-08-02 NOTE — Progress Notes (Signed)
PROGRESS NOTE                                                                                                                                                                                                             Patient Demographics:    [name], is a 75 y.o. female, DOB - April 11, 1946, MOL:[id number]  Outpatient Primary MD for the patient is Christain Sacramento, MD   Admit date - 07/27/2020   LOS - 6  Chief Complaint  Patient presents with  . Shortness of Breath  . Chest Pain    Started two weeks ago with progression to severe difficulty breathing and "bad cp"       Brief Narrative: Patient is a 75 y.o. female with PMHx of EtOH use, anemia of chronic disease, eczematous dermatitis, COPD on 3 L of oxygen at home, pulmonary hypertension-who presented with worsening cough-shortness of breath-found to have A. fib with RVR.  Started on Cardizem and heparin infusion-subsequently admitted to the hospitalist service.  COVID-19 vaccinated status: Vaccinated but not boosted  Significant Events: 2/12>> Admit to [facility] for COPD exacerbation-A. fib RVR-COVID-19 positive  Significant studies: 2/11>>Chest x-ray: Right> left basilar opacities 2/11>> CT abdomen/pelvis: No acute abdominal/pelvic findings, diffuse/severe fatty infiltration of liver 2/12>> Leg [place] - No DVT 2/13 TTE - IMPRESSIONS  1. Left ventricular ejection fraction, by estimation, is 55 to 60%. The left ventricle has normal function. The left ventricle has no regional wall motion abnormalities. Left ventricular diastolic parameters were normal.  2. Right ventricular systolic function is normal. The right ventricular size is mildly enlarged.  3. The mitral valve is normal in structure. No evidence of mitral valve regurgitation.  4. The aortic valve is tricuspid. Aortic valve regurgitation is not visualized. No aortic stenosis is present.  5. The inferior vena cava is  dilated in size with >50% respiratory variability, suggesting right atrial pressure of 8 mmHg  COVID-19 medications: Steroids: 2/12>>  Antibiotics: Doxycycline: 2/12>>  Microbiology data: None  Procedures: None  Consults: Cardiology, Pall Care  DVT prophylaxis: enoxaparin (LOVENOX) injection 40 mg Start: 07/29/20 1000 SCDs Start: 07/27/20 2311     Subjective:   Patient in bed in no acute distress, chronic baseline shortness of breath on 4 to 5 L nasal cannula oxygen, no headache, chronic substernal chest ache which has been present for several  months.   Assessment  & Plan :   COPD exacerbation: Probably triggered by COVID-19 infection-now on IV Solu-Medrol and doxycycline, scheduled Xopenex-add Incruse inhaler.  She is stable-moving air-appears comfortable.  Back on her usual 3-4 L of oxygen (home regimen), single dose Lasix on 08/02/2020 as she has some rales on exam, note she has mild chronic shortness of breath due to advanced underlying COPD, already appears extremely frail and cachectic, long-term prognosis does not look good will involve palliative care as well.  ?COVID-19 PNA: Possible pneumonic changes in x-ray chest-CRP elevated-however LFTs are significantly elevated.  Will treat with steroids-avoid Remdesivir due to significantly elevated LFTs.  Hypoxemia is at baseline-on usual 3 L of oxygen-no role for immunomodulators at this time.  Recent Labs  Lab 07/27/20 1515 07/27/20 1528 07/27/20 2047 07/27/20 2208 07/28/20 0743 07/29/20 0305 07/29/20 0811 07/30/20 0146 07/31/20 0430 08/01/20 0204 08/02/20 0045  WBC 8.2  --   --    < >  --  8.0  --  9.5 11.7* 9.2 11.0*  HGB 8.6*  --   --    < >  --  7.6*  --  7.6* 8.3* 8.2* 8.5*  HCT 28.1*  --   --    < >  --  21.9*  --  22.9* 26.5* 26.1* 26.6*  PLT 119*  --   --    < >  --  121*  --  117* 121* 111* 124*  CRP  --   --   --    < > 8.1* 4.9*  --  2.1* 1.0* 0.7 0.6  BNP 104.4*  --   --   --   --   --  530.6* 425.0*  561.1* 468.9* 236.8*  DDIMER  --   --   --   --  1.93* 3.01*  --  2.99* 3.14* 3.04*  --   PROCALCITON  --   --   --   --  0.61  --   --   --   --   --   --   AST 614*  --   --    < > 938* 467*  --  184* 70* 51* 50*  ALT 166*  --   --    < > 270* 236*  --  176* 135* 105* 104*  ALKPHOS 166*  --   --    < > 141* 137*  --  118 121 101 99  BILITOT 1.6*  --   --    < > 1.6* 0.8  --  0.9 0.8 0.7 0.6  ALBUMIN 2.7*  --   --    < > 2.7* 2.6*  --  2.5* 2.6* 2.4* 2.6*  INR  --   --   --    < >  --   --  0.9 1.0 0.9 0.9 1.0  LATICACIDVEN 6.4*  --  4.0*  --   --   --   --   --   --   --   --   SARSCOV2NAA  --  POSITIVE*  --   --   --   --   --   --   --   --   --    < > = values in this interval not displayed.      PAF with RVR: Back in sinus rhythm-stop Cardizem infusion-starting short acting Cardizem-on IV heparin currently.  She is very frail-lives alone-alcohol use-has chronic normocytic anemia-I agree with cardiology  that she is a poor long-term candidate for anticoagulation.  Probably better to stick with aspirin.  Echo noted, on aspirin only now.  Elevated D-dimer. -ve leg ultrasound, on prophylactic Lovenox, monitor, poor candidate for anticoagulation.    Acute alcoholic hepatitis: LFTs significantly elevated-but seems to be slowly downtrending-discriminant function only 5.  No role for steroids-CT abdomen with significant fatty infiltration.  Supportive care-avoid hepatotoxic medications.  Follow LFTs  EtOH withdrawal: Tremulous-but awake/alert.  Claims that she drinks at least one 12 ounce beer-along with 3-4 shots of liquor on a daily basis.  Last drink was 2/11.  No signs of DTs.  Normocytic anemia: Anemia panel suggestive of anemia of chronic disease, supportive care.  History of pulmonary hypertension: Due to COPD-outpatient follow-up with pulmonary.  Continue oxygen supplementation as needed.  COPD appears extremely advanced and terminal.  Deconditioning/debility/frailty: SNF once  stable.  Depression: Hold bupropion-allow LFTs to improve further before initiating.  Eczematous dermatitis-keratotic lesions in left breast/bilateral lower extremities - outpatient dermatology follow-up.   RN pressure injury documentation: Pressure Injury 08/19/19 Sacrum Mid Stage 2 -  Partial thickness loss of dermis presenting as a shallow open injury with a red, pink wound bed without slough. (Active)  08/19/19 0200  Location: Sacrum  Location Orientation: Mid  Staging: Stage 2 -  Partial thickness loss of dermis presenting as a shallow open injury with a red, pink wound bed without slough.  Wound Description (Comments):   Present on Admission: Yes    GI prophylaxis: PPI   Condition - Stable  Family Communication  :  Daughter-Donna-[phone] previous MD updated over the phone on 2/12- agreed with avoiding anticoagulation given alcohol use/safety-understands embolic/CVA risk, updated 07/29/2020 essential left at 11:37 AM, 08/01/20 message left at 11:13 AM  Code Status :  Full Code  Diet :  Diet Order            DIET DYS 2 Room service appropriate? Yes; Fluid consistency: Thin  Diet effective now                  Disposition Plan  :   Status is: Inpatient  Remains inpatient appropriate because:Inpatient level of care appropriate due to severity of illness   Dispo: The patient is from: Home              Anticipated d/c is to: SNF              Anticipated d/c date is: > 3 days              Patient currently is not medically stable to d/c.   Difficult to place patient No  Barriers to discharge: COPD exacerbation on IV steroids, COVID-19 infection, alcoholic hepatitis-needs continued inpatient monitoring/treatment.  Antimicorbials  :    Anti-infectives (From admission, onward)   Start     Dose/Rate Route Frequency Ordered Stop   07/28/20 1000  doxycycline (VIBRA-TABS) tablet 100 mg        100 mg Oral Every 12 hours 07/28/20 0813 08/01/20 1741      Inpatient  Medications  Scheduled Meds: . alum & mag hydroxide-simeth  30 mL Oral BID  . aspirin  81 mg Oral Daily  . diltiazem  60 mg Oral Q6H  . enoxaparin (LOVENOX) injection  40 mg Subcutaneous Q24H  . feeding supplement  237 mL Oral TID BM  . folic acid  1 mg Oral Daily  . isosorbide mononitrate  30 mg Oral Daily  . levalbuterol  2 puff  Inhalation Q6H  . mouth rinse  15 mL Mouth Rinse BID  . methylPREDNISolone (SOLU-MEDROL) injection  40 mg Intravenous BID  . mometasone-formoterol  2 puff Inhalation BID  . multivitamin with minerals  1 tablet Oral Daily  . pantoprazole  40 mg Oral Q1200  . thiamine  100 mg Oral Daily   Or  . thiamine  100 mg Intravenous Daily  . umeclidinium bromide  1 puff Inhalation Daily   Continuous Infusions:  PRN Meds:.acetaminophen, albuterol, benzonatate, guaiFENesin-dextromethorphan, lip balm, ondansetron (ZOFRAN) IV   Time Spent in minutes  25  See all Orders from today for further details   Lala Lund M.D on 08/02/2020 at 10:00 AM  To page go to [email or web address] - use universal password  [facility] -  Office  [phone]    Objective:   Vitals:   08/01/20 1459 08/01/20 1540 08/01/20 2118 08/02/20 0457  BP: 115/74 124/82 (!) 120/58 135/62  Pulse: (!) 111 99 87 97  Resp: _0 Temp: 98.7 F (37.1 C) 98.7 F (37.1 C) 98.7 F (37.1 C) 98.3 F (36.8 C)  TempSrc: Oral Oral Oral Axillary  SpO2:  98% 100% 97%  Weight:      Height:        Wt Readings from Last 3 Encounters:  07/30/20 50.2 kg  06/22/20 51.5 kg  02/13/20 50.2 kg     Intake/Output Summary (Last 24 hours) at 08/02/2020 1000 Last data filed at 08/02/2020 0000 Gross per 24 hour  Intake 270 ml  Output 1475 ml  Net -1205 ml     Physical Exam  Extremely frail, cachectic elderly white female, in no acute distress, no focal deficits [place].AT,PERRAL Supple Neck,No JVD, No cervical lymphadenopathy appriciated.  Symmetrical Chest wall movement, Mod air movement  bilaterally, few rales RRR,No Gallops, Rubs or new Murmurs, No Parasternal Heave +ve B.Sounds, Abd Soft, No tenderness, No organomegaly appriciated, No rebound - guarding or rigidity. chronic diffuse bilateral upper extremity bruising from previous lab draws, chronic scaly rash both legs & L.chest wall   Data Review:    CBC  Recent Labs  Lab 07/27/20 1515 07/28/20 0216 07/29/20 0305 07/30/20 0146 07/31/20 0430 08/01/20 0204 08/02/20 0045  WBC 8.2   < > 8.0 9.5 11.7* 9.2 11.0*  HGB 8.6*   < > 7.6* 7.6* 8.3* 8.2* 8.5*  HCT 28.1*   < > 21.9* 22.9* 26.5* 26.1* 26.6*  PLT 119*   < > 121* 117* 121* 111* 124*  MCV 101.8*   < > 94.4 97.0 99.6 99.6 98.5  MCH 31.2   < > 32.8 32.2 31.2 31.3 31.5  MCHC 30.6   < > 34.7 33.2 31.3 31.4 32.0  RDW 18.2*   < > 17.7* 18.1* 18.3* 18.5* 18.7*  LYMPHSABS 1.9  --   --   --   --   --   --   MONOABS 1.6*  --   --   --   --   --   --   EOSABS 0.0  --   --   --   --   --   --   BASOSABS 0.1  --   --   --   --   --   --    < > = values in this interval not displayed.    Chemistries  Recent Labs  Lab 07/29/20 0305 07/30/20 0146 07/31/20 0430 08/01/20 0204 08/02/20 0045  NA 140 138 138 137 137  K 4.0 3.8  3.8 3.9 3.7  CL 93* 91* 93* 93* 89*  CO2 35* 38* 38* 37* 39*  GLUCOSE 219* 225* 195* 200* 224*  BUN _0 27*  CREATININE 0.55 0.51 0.47 0.47 0.90  CALCIUM 8.5* 8.5* 8.5* 8.5* 8.6*  MG 2.3 2.1 1.9 1.9 2.1  AST 467* 184* 70* 51* 50*  ALT 236* 176* 135* 105* 104*  ALKPHOS 137* 118 121 101 99  BILITOT 0.8 0.9 0.8 0.7 0.6   ------------------------------------------------------------------------------------------------------------------ No results for input(s): CHOL, HDL, LDLCALC, TRIG, CHOLHDL, LDLDIRECT in the last 72 hours.  Lab Results  Component Value Date   HGBA1C 6.1 (H) 05/13/2018   ------------------------------------------------------------------------------------------------------------------ No results for input(s):  TSH, T4TOTAL, T3FREE, THYROIDAB in the last 72 hours.  Invalid input(s): FREET3 ------------------------------------------------------------------------------------------------------------------ No results for input(s): VITAMINB12, FOLATE, FERRITIN, TIBC, IRON, RETICCTPCT in the last 72 hours.  Coagulation profile Recent Labs  Lab 07/29/20 0811 07/30/20 0146 07/31/20 0430 08/01/20 0204 08/02/20 0045  INR 0.9 1.0 0.9 0.9 1.0    Recent Labs    07/31/20 0430 08/01/20 0204  DDIMER 3.14* 3.04*    Cardiac Enzymes No results for input(s): CKMB, TROPONINI, MYOGLOBIN in the last 168 hours.  Invalid input(s): CK ------------------------------------------------------------------------------------------------------------------    Component Value Date/Time   BNP 236.8 (H) 08/02/2020 0045    Micro Results Recent Results (from the past 240 hour(s))  Resp Panel by RT-PCR (Flu A&B, Covid) Nasopharyngeal Swab     Status: Abnormal   Collection Time: 07/27/20  3:28 PM   Specimen: Nasopharyngeal Swab; Nasopharyngeal(NP) swabs in vial transport medium  Result Value Ref Range Status   SARS Coronavirus 2 by RT PCR POSITIVE (A) NEGATIVE Final    Comment: RESULT CALLED TO, READ BACK BY AND VERIFIED WITH: Wyvonna Plum RN [id number] 07/27/20 A BROWNING (NOTE) SARS-CoV-2 target nucleic acids are DETECTED.  The SARS-CoV-2 RNA is generally detectable in upper respiratory specimens during the acute phase of infection. Positive results are indicative of the presence of the identified virus, but do not rule out bacterial infection or co-infection with other pathogens not detected by the test. Clinical correlation with patient history and other diagnostic information is necessary to determine patient infection status. The expected result is Negative.  Fact Sheet for Patients: EntrepreneurPulse.com.au  Fact Sheet for Healthcare Providers: IncredibleEmployment.be  This  test is not yet approved or cleared by the [place] FDA and  has been authorized for detection and/or diagnosis of SARS-CoV-2 by FDA under an Emergency Use Authorization (EUA).  This EUA will remain in effect (meaning this test can  be used) for the duration of  the COVID-19 declaration under Section 564(b)(1) of the Act, 21 U.S.C. section 360bbb-3(b)(1), unless the authorization is terminated or revoked sooner.     Influenza A by PCR NEGATIVE NEGATIVE Final   Influenza B by PCR NEGATIVE NEGATIVE Final    Comment: (NOTE) The Xpert Xpress SARS-CoV-2/FLU/RSV plus assay is intended as an aid in the diagnosis of influenza from Nasopharyngeal swab specimens and should not be used as a sole basis for treatment. Nasal washings and aspirates are unacceptable for Xpert Xpress SARS-CoV-2/FLU/RSV testing.  Fact Sheet for Patients: EntrepreneurPulse.com.au  Fact Sheet for Healthcare Providers: IncredibleEmployment.be  This test is not yet approved or cleared by the [place] FDA and has been authorized for detection and/or diagnosis of SARS-CoV-2 by FDA under an Emergency Use Authorization (EUA). This EUA will remain in effect (meaning this test can be used) for the duration of the COVID-19 declaration under  Section 564(b)(1) of the Act, 21 U.S.C. section 360bbb-3(b)(1), unless the authorization is terminated or revoked.  Performed at [facility], [place] [address], [place], [place] [address]     Radiology Reports CT ABDOMEN PELVIS W CONTRAST  Result Date: 07/27/2020 CLINICAL DATA:  Elevated liver function studies. EXAM: CT ABDOMEN AND PELVIS WITH CONTRAST TECHNIQUE: Multidetector CT imaging of the abdomen and pelvis was performed using the standard protocol following bolus administration of intravenous contrast. CONTRAST:  72m OMNIPAQUE IOHEXOL 300 MG/ML  SOLN COMPARISON:  None. FINDINGS: Lower chest: Moderate breathing motion artifact.  There are emphysematous changes and pulmonary scarring but no definite infiltrates or effusions. The heart is within normal limits in size. Moderate aortic calcifications. Hepatobiliary: Diffuse and severe fatty infiltration of the liver with focal areas of more pronounced fatty change. No worrisome hepatic lesions or intrahepatic biliary dilatation. The gallbladder is unremarkable. No common bile duct dilatation. Pancreas: Advanced pancreatic atrophy but no mass, inflammation or ductal dilatation. Spleen: Normal size.  No focal lesions. Adrenals/Urinary Tract: Mild nodularity of both adrenal glands but no worrisome lesions. Scattered low-attenuation renal lesions are likely benign cysts. No worrisome renal lesions or hydronephrosis. 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 The bladder is unremarkable. Stomach/Bowel: The stomach, duodenum, small bowel and colon are grossly normal. Vascular/Lymphatic: Advanced atherosclerotic calcifications involving the aorta and branch vessels but no aneurysm. The major venous structures are patent. No mesenteric or retroperitoneal mass or adenopathy. Small scattered lymph nodes are noted. Reproductive: The uterus and ovaries are unremarkable. Other: No pelvic mass or adenopathy. No free pelvic fluid collections. No inguinal mass or adenopathy. No abdominal wall hernia or subcutaneous lesions. Musculoskeletal: No significant bony findings. IMPRESSION: 1. Diffuse and severe fatty infiltration of the liver. 2. No acute abdominal/pelvic findings, mass lesions or adenopathy. 3. Advanced atherosclerotic calcifications involving the aorta and branch vessels. 4. 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 5. Emphysematous changes and pulmonary scarring at the lung bases. 6. Emphysema and aortic atherosclerosis. Aortic Atherosclerosis (ICD10-I70.0) and Emphysema (ICD10-J43.9). Electronically Signed   By: PMarijo SanesM.D.   On:  07/27/2020 19:46   DG Chest Port 1 View  Result Date: 08/01/2020 CLINICAL DATA:  Shortness of breath.  COVID. EXAM: PORTABLE CHEST 1 VIEW COMPARISON:  CT 01/15/2020.  Chest x-ray 08/18/2019, 05/11/2018. FINDINGS: Mediastinum and hilar structures are normal. COPD. Chronic bilateral interstitial changes are noted. Bibasilar subsegmental atelectasis and or scarring again noted. No acute infiltrate noted. Previously noted pulmonary nodules on CT of 01/16/2020 best evaluated by prior CT. No pleural effusion or pneumothorax. IMPRESSION: 1. COPD. Chronic bilateral interstitial changes and bibasilar subsegmental atelectasis and or scarring. 2. Previously noted pulmonary nodules noted on CT of 01/16/2020 best evaluated by CT. Reference is made to that report. Electronically Signed   By: TMarcello Moores Register   On: 08/01/2020 08:21   DG Chest Portable 1 View  Result Date: 07/27/2020 CLINICAL DATA:  SOB EXAM: PORTABLE CHEST 1 VIEW COMPARISON:  01/16/2020 and prior. FINDINGS: Emphysematous changes. Patchy right predominant basilar opacities. No pneumothorax or pleural effusion. Cardiomediastinal silhouette within normal limits. IMPRESSION: Right predominant basilar opacities, atelectasis versus infiltrate. Emphysema. Electronically Signed   By: CPrimitivo GauzeM.D.   On: 07/27/2020 15:25   VAS [place]OWER EXTREMITY VENOUS (DVT)  Result Date: 07/30/2020  Lower Venous DVT Study Indications: Covid, d-dimer.  Anticoagulation: Lovenox. Limitations: Poor ultrasound/tissue interface due to superficial skin changes. Comparison Study: 03-20-2020 Prior LT lower  extremity venous was negative for                   DVT. Performing Technologist: Darlin Coco RDMS  Examination Guidelines: A complete evaluation includes B-mode imaging, spectral Doppler, color Doppler, and power Doppler as needed of all accessible portions of each vessel. Bilateral testing is considered an integral part of a complete examination. Limited examinations  for reoccurring indications may be performed as noted. The reflux portion of the exam is performed with the patient in reverse Trendelenburg.  +---------+---------------+---------+-----------+----------+-------------------+ RIGHT    CompressibilityPhasicitySpontaneityPropertiesThrombus Aging      +---------+---------------+---------+-----------+----------+-------------------+ CFV      Full           Yes      Yes                                      +---------+---------------+---------+-----------+----------+-------------------+ SFJ      Full                                                             +---------+---------------+---------+-----------+----------+-------------------+ FV Prox  Full                                                             +---------+---------------+---------+-----------+----------+-------------------+ FV Mid   Full                                                             +---------+---------------+---------+-----------+----------+-------------------+ FV DistalFull                                                             +---------+---------------+---------+-----------+----------+-------------------+ PFV      Full                                                             +---------+---------------+---------+-----------+----------+-------------------+ POP      Full           Yes      Yes                                      +---------+---------------+---------+-----------+----------+-------------------+ PTV      Full  Some segments not                                                         well visualized     +---------+---------------+---------+-----------+----------+-------------------+ PERO     Full                                         Some segments not                                                         well visualized      +---------+---------------+---------+-----------+----------+-------------------+   +---------+---------------+---------+-----------+----------+-------------------+ LEFT     CompressibilityPhasicitySpontaneityPropertiesThrombus Aging      +---------+---------------+---------+-----------+----------+-------------------+ CFV      Full           Yes      Yes                                      +---------+---------------+---------+-----------+----------+-------------------+ SFJ      Full                                                             +---------+---------------+---------+-----------+----------+-------------------+ FV Prox  Full                                                             +---------+---------------+---------+-----------+----------+-------------------+ FV Mid   Full                                                             +---------+---------------+---------+-----------+----------+-------------------+ FV DistalFull                                                             +---------+---------------+---------+-----------+----------+-------------------+ PFV      Full                                                             +---------+---------------+---------+-----------+----------+-------------------+ POP      Full  Yes      Yes                                      +---------+---------------+---------+-----------+----------+-------------------+ PTV      Full                                         Some segments not                                                         well visualized     +---------+---------------+---------+-----------+----------+-------------------+ PERO     Full                                         Some segments not                                                         well visualized     +---------+---------------+---------+-----------+----------+-------------------+      Summary: RIGHT: - There is no evidence of deep vein thrombosis in the lower extremity. However, portions of this examination were limited- see technologist comments above.  - No cystic structure found in the popliteal fossa.  LEFT: - There is no evidence of deep vein thrombosis in the lower extremity. However, portions of this examination were limited- see technologist comments above.  - No cystic structure found in the popliteal fossa.  *See table(s) above for measurements and observations. Electronically signed by Ruta Hinds MD on 07/30/2020 at 4:19:16 PM.    Final    ECHOCARDIOGRAM LIMITED  Result Date: 07/28/2020    ECHOCARDIOGRAM LIMITED REPORT   Patient Name:   SHAWNTE WINTON [facility] Date of Exam: 07/28/2020 Medical Rec #:  [id number]       Height:       62.0 in Accession #:    [id number]      Weight:       108.0 lb Date of Birth:  01/21/1946       BSA:          1.471 m Patient Age:    38 years        BP:           114/78 mmHg Patient Gender: F               HR:           83 bpm. Exam Location:  Inpatient Procedure: Limited Echo, Limited Color Doppler and Cardiac Doppler Indications:    atrial fibrillation  History:        Patient has no prior history of Echocardiogram examinations.                 Covid and COPD; Risk Factors:Hypertension.  Sonographer:    Johny Chess Referring Phys: [place]  1. Left ventricular ejection fraction, by estimation, is 55 to  60%. The left ventricle has normal function. The left ventricle has no regional wall motion abnormalities. Left ventricular diastolic parameters were normal.  2. Right ventricular systolic function is normal. The right ventricular size is mildly enlarged.  3. The mitral valve is normal in structure. No evidence of mitral valve regurgitation.  4. The aortic valve is tricuspid. Aortic valve regurgitation is not visualized. No aortic stenosis is present.  5. The inferior vena cava is dilated in size with >50% respiratory  variability, suggesting right atrial pressure of 8 mmHg. Comparison(s): No prior Echocardiogram. FINDINGS  Left Ventricle: Left ventricular ejection fraction, by estimation, is 55 to 60%. The left ventricle has normal function. The left ventricle has no regional wall motion abnormalities. The left ventricular internal cavity size was normal in size. There is  no left ventricular hypertrophy. Left ventricular diastolic parameters were normal. Right Ventricle: The right ventricular size is mildly enlarged. Right ventricular systolic function is normal. Mitral Valve: The mitral valve is normal in structure. Tricuspid Valve: The tricuspid valve is normal in structure. Tricuspid valve regurgitation is not demonstrated. Aortic Valve: The aortic valve is tricuspid. Aortic valve regurgitation is not visualized. No aortic stenosis is present. Aorta: The aortic root is normal in size and structure and the ascending aorta was not well visualized. Venous: The inferior vena cava is dilated in size with greater than 50% respiratory variability, suggesting right atrial pressure of 8 mmHg. LEFT VENTRICLE PLAX 2D LVIDd:         4.40 cm  Diastology LVIDs:         2.50 cm  LV e' medial:    10.60 cm/s LV PW:         0.80 cm  LV E/e' medial:  7.9 LV IVS:        0.70 cm  LV e' lateral:   11.50 cm/s LVOT diam:     1.90 cm  LV E/e' lateral: 7.3 LV SV:         66 LV SV Index:   45 LVOT Area:     2.84 cm  IVC IVC diam: 2.40 cm LEFT ATRIUM         Index LA diam:    3.70 cm 2.51 cm/m  AORTIC VALVE LVOT Vmax:   137.00 cm/s LVOT Vmean:  87.500 cm/s LVOT VTI:    0.233 m  AORTA Ao Root diam: 2.90 cm MITRAL VALVE MV Area (PHT): 4.10 cm    SHUNTS MV Decel Time: 185 msec    Systemic VTI:  0.23 m MV E velocity: 83.60 cm/s  Systemic Diam: 1.90 cm MV A velocity: 71.10 cm/s MV E/A ratio:  1.18 Rudean Haskell MD Electronically signed by Rudean Haskell MD Signature Date/Time: 07/28/2020/5:07:15 PM    Final

## 2020-08-02 NOTE — Progress Notes (Signed)
PT Cancellation Note  Patient Details Name: SHAKEVIA SARRIS MRN: [id number] DOB: November 20, 1945   Cancelled Treatment:    Reason Eval/Treat Not Completed: Fatigue/lethargy limiting ability to participate - pt with significant fatigue having just finished washup with NT. Will follow-up for PT treatment as schedule permits.  Mabeline Caras, PT, DPT Acute Rehabilitation Services  Pager [phone] Office [place] 08/02/2020, 12:26 PM

## 2020-08-03 DIAGNOSIS — I4891 Unspecified atrial fibrillation: Secondary | ICD-10-CM | POA: Diagnosis not present

## 2020-08-03 DIAGNOSIS — F10230 Alcohol dependence with withdrawal, uncomplicated: Secondary | ICD-10-CM | POA: Diagnosis not present

## 2020-08-03 DIAGNOSIS — R0602 Shortness of breath: Secondary | ICD-10-CM

## 2020-08-03 DIAGNOSIS — Z789 Other specified health status: Secondary | ICD-10-CM

## 2020-08-03 DIAGNOSIS — R52 Pain, unspecified: Secondary | ICD-10-CM

## 2020-08-03 DIAGNOSIS — U071 COVID-19: Secondary | ICD-10-CM | POA: Diagnosis not present

## 2020-08-03 DIAGNOSIS — J9611 Chronic respiratory failure with hypoxia: Secondary | ICD-10-CM | POA: Diagnosis not present

## 2020-08-03 LAB — CBC
HCT: 25.9 % — ABNORMAL LOW (ref 36.0–46.0)
Hemoglobin: 8.3 g/dL — ABNORMAL LOW (ref 12.0–15.0)
MCH: 31.8 pg (ref 26.0–34.0)
MCHC: 32 g/dL (ref 30.0–36.0)
MCV: 99.2 fL (ref 80.0–100.0)
Platelets: 136 10*3/uL — ABNORMAL LOW (ref 150–400)
RBC: 2.61 MIL/uL — ABNORMAL LOW (ref 3.87–5.11)
RDW: 19.3 % — ABNORMAL HIGH (ref 11.5–15.5)
WBC: 10.6 10*3/uL — ABNORMAL HIGH (ref 4.0–10.5)
nRBC: 0.3 % — ABNORMAL HIGH (ref 0.0–0.2)

## 2020-08-03 LAB — COMPREHENSIVE METABOLIC PANEL
ALT: 91 U/L — ABNORMAL HIGH (ref 0–44)
AST: 46 U/L — ABNORMAL HIGH (ref 15–41)
Albumin: 2.6 g/dL — ABNORMAL LOW (ref 3.5–5.0)
Alkaline Phosphatase: 86 U/L (ref 38–126)
Anion gap: 12 (ref 5–15)
BUN: 30 mg/dL — ABNORMAL HIGH (ref 8–23)
CO2: 38 mmol/L — ABNORMAL HIGH (ref 22–32)
Calcium: 8.9 mg/dL (ref 8.9–10.3)
Chloride: 88 mmol/L — ABNORMAL LOW (ref 98–111)
Creatinine, Ser: 0.72 mg/dL (ref 0.44–1.00)
GFR, Estimated: >60 mL/min (ref >60)
Glucose, Bld: 229 mg/dL — ABNORMAL HIGH (ref 70–99)
Potassium: 3.9 mmol/L (ref 3.5–5.1)
Sodium: 138 mmol/L (ref 135–145)
Total Bilirubin: 0.3 mg/dL (ref 0.3–1.2)
Total Protein: 4.9 g/dL — ABNORMAL LOW (ref 6.5–8.1)

## 2020-08-03 LAB — C-REACTIVE PROTEIN: CRP: 0.6 mg/dL (ref <1.0)

## 2020-08-03 LAB — MAGNESIUM: Magnesium: 2 mg/dL (ref 1.7–2.4)

## 2020-08-03 LAB — BRAIN NATRIURETIC PEPTIDE: B Natriuretic Peptide: 303.5 pg/mL — ABNORMAL HIGH (ref 0.0–100.0)

## 2020-08-03 MED ORDER — HYDROCODONE-ACETAMINOPHEN 7.5-325 MG PO TABS
1.0000 | ORAL_TABLET | Freq: Four times a day (QID) | ORAL | Status: DC | PRN
  Administered 2020-08-03 – 2020-08-07 (×12): 1 via ORAL
  Filled 2020-08-03 (×12): qty 1

## 2020-08-03 MED ORDER — MORPHINE SULFATE (PF) 2 MG/ML IV SOLN: 2.0000 mg | INTRAVENOUS | Status: DC | PRN

## 2020-08-03 NOTE — Progress Notes (Signed)
This chaplain is responding to PMT consult for notarizing Pt. HCPOA and naming [employer] as the Pt. Healthcare agent.  This chaplain understands the Pt. COVID isolation until 08/16/20 prevents the presence of a notary and two witnesses for notarizing the Pt. HCPOA.    This chaplain will communicate with PMT and is available to F/U as needed.

## 2020-08-03 NOTE — Progress Notes (Signed)
PROGRESS NOTE                                                                                                                                                                                                             Patient Demographics:    [name], is a 75 y.o. female, DOB - 31-Jan-1946, PVV:[id number]  Outpatient Primary MD for the patient is Christain Sacramento, MD   Admit date - 07/27/2020   LOS - 7  Chief Complaint  Patient presents with  . Shortness of Breath  . Chest Pain    Started two weeks ago with progression to severe difficulty breathing and "bad cp"       Brief Narrative: Patient is a 75 y.o. female with PMHx of EtOH use, anemia of chronic disease, eczematous dermatitis, COPD on 3 L of oxygen at home, pulmonary hypertension-who presented with worsening cough-shortness of breath-found to have A. fib with RVR.  Started on Cardizem and heparin infusion-subsequently admitted to the hospitalist service.  COVID-19 vaccinated status: Vaccinated but not boosted  Significant Events: 2/12>> Admit to [facility] for COPD exacerbation-A. fib RVR-COVID-19 positive  Significant studies: 2/11>>Chest x-ray: Right> left basilar opacities 2/11>> CT abdomen/pelvis: No acute abdominal/pelvic findings, diffuse/severe fatty infiltration of liver 2/12>> Leg [place] - No DVT 2/13 TTE - IMPRESSIONS  1. Left ventricular ejection fraction, by estimation, is 55 to 60%. The left ventricle has normal function. The left ventricle has no regional wall motion abnormalities. Left ventricular diastolic parameters were normal.  2. Right ventricular systolic function is normal. The right ventricular size is mildly enlarged.  3. The mitral valve is normal in structure. No evidence of mitral valve regurgitation.  4. The aortic valve is tricuspid. Aortic valve regurgitation is not visualized. No aortic stenosis is present.  5. The inferior vena cava is  dilated in size with >50% respiratory variability, suggesting right atrial pressure of 8 mmHg  COVID-19 medications: Steroids: 2/12>>  Antibiotics: Doxycycline: 2/12>>  Microbiology data: None  Procedures: None  Consults: Cardiology, Pall Care  DVT prophylaxis: enoxaparin (LOVENOX) injection 40 mg Start: 07/29/20 1000 SCDs Start: 07/27/20 2311     Subjective:   Patient in bed denies any headache, no new chest or abdominal pain, minimally improved shortness of breath.   Assessment  & Plan :   COPD exacerbation: Probably triggered by COVID-19  infection-now on IV Solu-Medrol and doxycycline, scheduled Xopenex-add Incruse inhaler.  She is stable-moving air-appears comfortable.  Back on her usual 3-4 L of oxygen (home regimen), single dose Lasix on 08/02/2020 as she has some rales on exam, note she has mild chronic shortness of breath due to advanced underlying COPD, already appears extremely frail and cachectic, long-term prognosis does not look good, palliative care involved patient now deciding between residential and home hospice versus home with palliative care.  She will make her decision in the next few days.  ?COVID-19 PNA: Possible pneumonic changes in x-ray chest-CRP elevated-however LFTs are significantly elevated.  Will treat with steroids-avoid Remdesivir due to significantly elevated LFTs.  Hypoxemia is at baseline-on usual 3 L of oxygen-no role for immunomodulators at this time.  Recent Labs  Lab 07/27/20 1515 07/27/20 1528 07/27/20 2047 07/27/20 2208 07/28/20 0743 07/29/20 0305 07/29/20 0811 07/30/20 0146 07/31/20 0430 08/01/20 0204 08/02/20 0045 08/03/20 0119  WBC 8.2  --   --    < >  --  8.0  --  9.5 11.7* 9.2 11.0* 10.6*  HGB 8.6*  --   --    < >  --  7.6*  --  7.6* 8.3* 8.2* 8.5* 8.3*  HCT 28.1*  --   --    < >  --  21.9*  --  22.9* 26.5* 26.1* 26.6* 25.9*  PLT 119*  --   --    < >  --  121*  --  117* 121* 111* 124* 136*  CRP  --   --   --    < > 8.1*  4.9*  --  2.1* 1.0* 0.7 0.6 0.6  BNP 104.4*  --   --   --   --   --  530.6* 425.0* 561.1* 468.9* 236.8* 303.5*  DDIMER  --   --   --   --  1.93* 3.01*  --  2.99* 3.14* 3.04*  --   --   PROCALCITON  --   --   --   --  0.61  --   --   --   --   --   --   --   AST 614*  --   --    < > 938* 467*  --  184* 70* 51* 50* 46*  ALT 166*  --   --    < > 270* 236*  --  176* 135* 105* 104* 91*  ALKPHOS 166*  --   --    < > 141* 137*  --  118 121 101 99 86  BILITOT 1.6*  --   --    < > 1.6* 0.8  --  0.9 0.8 0.7 0.6 0.3  ALBUMIN 2.7*  --   --    < > 2.7* 2.6*  --  2.5* 2.6* 2.4* 2.6* 2.6*  INR  --   --   --    < >  --   --  0.9 1.0 0.9 0.9 1.0  --   LATICACIDVEN 6.4*  --  4.0*  --   --   --   --   --   --   --   --   --   SARSCOV2NAA  --  POSITIVE*  --   --   --   --   --   --   --   --   --   --    < > = values in this interval not displayed.  PAF with RVR: Back in sinus rhythm-stop Cardizem infusion-starting short acting Cardizem-on IV heparin currently.  She is very frail-lives alone-alcohol use-has chronic normocytic anemia-I agree with cardiology that she is a poor long-term candidate for anticoagulation.  Probably better to stick with aspirin.  Echo noted, on aspirin only now.  Elevated D-dimer. -ve leg ultrasound, on prophylactic Lovenox, monitor, poor candidate for anticoagulation.    Acute alcoholic hepatitis: LFTs significantly elevated-but seems to be slowly downtrending-discriminant function only 5.  No role for steroids-CT abdomen with significant fatty infiltration.  Supportive care-avoid hepatotoxic medications.  Follow LFTs  EtOH withdrawal: Tremulous-but awake/alert.  Claims that she drinks at least one 12 ounce beer-along with 3-4 shots of liquor on a daily basis.  Last drink was 2/11.  No signs of DTs.  Normocytic anemia: Anemia panel suggestive of anemia of chronic disease, supportive care.  History of pulmonary hypertension: Due to COPD-outpatient follow-up with pulmonary.   Continue oxygen supplementation as needed.  COPD appears extremely advanced and terminal.  Deconditioning/debility/frailty: SNF once stable.  Depression: Hold bupropion-allow LFTs to improve further before initiating.  Eczematous dermatitis-keratotic lesions in left breast/bilateral lower extremities - outpatient dermatology follow-up.   RN pressure injury documentation: Pressure Injury 08/19/19 Sacrum Mid Stage 2 -  Partial thickness loss of dermis presenting as a shallow open injury with a red, pink wound bed without slough. (Active)  08/19/19 0200  Location: Sacrum  Location Orientation: Mid  Staging: Stage 2 -  Partial thickness loss of dermis presenting as a shallow open injury with a red, pink wound bed without slough.  Wound Description (Comments):   Present on Admission: Yes    GI prophylaxis: PPI   Condition - Stable  Family Communication  :  Daughter-[phone] previous MD updated over the phone on 2/12- agreed with avoiding anticoagulation given alcohol use/safety-understands embolic/CVA risk, updated 07/29/2020 essential left at 11:37 AM, 08/01/20 message left at 11:13 AM.  Patient now wishes her daughter not to be involved in patient's care.  Code Status :  Full Code  Diet :  Diet Order            DIET DYS 2 Room service appropriate? Yes; Fluid consistency: Thin  Diet effective now                  Disposition Plan  :   Status is: Inpatient  Remains inpatient appropriate because:Inpatient level of care appropriate due to severity of illness   Dispo: The patient is from: Home              Anticipated d/c is to: SNF              Anticipated d/c date is: > 3 days              Patient currently is not medically stable to d/c.   Difficult to place patient No  Barriers to discharge: COPD exacerbation on IV steroids, COVID-19 infection, alcoholic hepatitis-needs continued inpatient monitoring/treatment.  Antimicorbials  :    Anti-infectives (From  admission, onward)   Start     Dose/Rate Route Frequency Ordered Stop   07/28/20 1000  doxycycline (VIBRA-TABS) tablet 100 mg        100 mg Oral Every 12 hours 07/28/20 0813 08/01/20 1741      Inpatient Medications  Scheduled Meds: . aspirin  81 mg Oral Daily  . busPIRone  5 mg Oral BID  . diltiazem  60 mg Oral Q6H  . enoxaparin (LOVENOX) injection  40 mg Subcutaneous Q24H  . feeding supplement  237 mL Oral TID BM  . folic acid  1 mg Oral Daily  . isosorbide mononitrate  30 mg Oral Daily  . levalbuterol  2 puff Inhalation Q6H  . mouth rinse  15 mL Mouth Rinse BID  . methylPREDNISolone (SOLU-MEDROL) injection  40 mg Intravenous BID  . mometasone-formoterol  2 puff Inhalation BID  . multivitamin with minerals  1 tablet Oral Daily  . pantoprazole  40 mg Oral Q1200  . thiamine  100 mg Oral Daily   Or  . thiamine  100 mg Intravenous Daily  . umeclidinium bromide  1 puff Inhalation Daily   Continuous Infusions:  PRN Meds:.acetaminophen, albuterol, benzonatate, guaiFENesin-dextromethorphan, lip balm, ondansetron (ZOFRAN) IV   Time Spent in minutes  25  See all Orders from today for further details   Lala Lund M.D on 08/03/2020 at 10:59 AM  To page go to [email or web address] - use universal password  [facility] -  Office  [phone]    Objective:   Vitals:   08/02/20 0457 08/02/20 2043 08/03/20 0100 08/03/20 0639  BP: 135/62 130/60 130/71 (!) 110/55  Pulse: 97 96 78 82  Resp: _0 Temp: 98.3 F (36.8 C) 98.2 F (36.8 C) 98.1 F (36.7 C) 98.5 F (36.9 C)  TempSrc: Axillary Oral Oral Axillary  SpO2: 97% 97% 96% 98%  Weight:      Height:        Wt Readings from Last 3 Encounters:  07/30/20 50.2 kg  06/22/20 51.5 kg  02/13/20 50.2 kg     Intake/Output Summary (Last 24 hours) at 08/03/2020 1059 Last data filed at 08/03/2020 1324 Gross per 24 hour  Intake --  Output 750 ml  Net -750 ml     Physical Exam  Extremely frail, cachectic elderly  white female, in no acute distress, no focal deficits [place].AT,PERRAL Supple Neck,No JVD, No cervical lymphadenopathy appriciated.  Symmetrical Chest wall movement, Good air movement bilaterally, CTAB RRR,No Gallops, Rubs or new Murmurs, No Parasternal Heave +ve B.Sounds, Abd Soft, No tenderness, No organomegaly appriciated, No rebound - guarding or rigidity. chronic diffuse bilateral upper extremity bruising from previous lab draws, chronic scaly rash both legs & L.chest wall   Data Review:    CBC  Recent Labs  Lab 07/27/20 1515 07/28/20 0216 07/30/20 0146 07/31/20 0430 08/01/20 0204 08/02/20 0045 08/03/20 0119  WBC 8.2   < > 9.5 11.7* 9.2 11.0* 10.6*  HGB 8.6*   < > 7.6* 8.3* 8.2* 8.5* 8.3*  HCT 28.1*   < > 22.9* 26.5* 26.1* 26.6* 25.9*  PLT 119*   < > 117* 121* 111* 124* 136*  MCV 101.8*   < > 97.0 99.6 99.6 98.5 99.2  MCH 31.2   < > 32.2 31.2 31.3 31.5 31.8  MCHC 30.6   < > 33.2 31.3 31.4 32.0 32.0  RDW 18.2*   < > 18.1* 18.3* 18.5* 18.7* 19.3*  LYMPHSABS 1.9  --   --   --   --   --   --   MONOABS 1.6*  --   --   --   --   --   --   EOSABS 0.0  --   --   --   --   --   --   BASOSABS 0.1  --   --   --   --   --   --    < > = values  in this interval not displayed.    Chemistries  Recent Labs  Lab 07/30/20 0146 07/31/20 0430 08/01/20 0204 08/02/20 0045 08/03/20 0119  NA 138 138 137 137 138  K 3.8 3.8 3.9 3.7 3.9  CL 91* 93* 93* 89* 88*  CO2 38* 38* 37* 39* 38*  GLUCOSE 225* 195* 200* 224* 229*  BUN _0 27* 30*  CREATININE 0.51 0.47 0.47 0.90 0.72  CALCIUM 8.5* 8.5* 8.5* 8.6* 8.9  MG 2.1 1.9 1.9 2.1 2.0  AST 184* 70* 51* 50* 46*  ALT 176* 135* 105* 104* 91*  ALKPHOS 118 121 101 99 86  BILITOT 0.9 0.8 0.7 0.6 0.3   ------------------------------------------------------------------------------------------------------------------ No results for input(s): CHOL, HDL, LDLCALC, TRIG, CHOLHDL, LDLDIRECT in the last 72 hours.  Lab Results  Component Value Date    HGBA1C 6.1 (H) 05/13/2018   ------------------------------------------------------------------------------------------------------------------ No results for input(s): TSH, T4TOTAL, T3FREE, THYROIDAB in the last 72 hours.  Invalid input(s): FREET3 ------------------------------------------------------------------------------------------------------------------ No results for input(s): VITAMINB12, FOLATE, FERRITIN, TIBC, IRON, RETICCTPCT in the last 72 hours.  Coagulation profile Recent Labs  Lab 07/29/20 0811 07/30/20 0146 07/31/20 0430 08/01/20 0204 08/02/20 0045  INR 0.9 1.0 0.9 0.9 1.0    Recent Labs    08/01/20 0204  DDIMER 3.04*    Cardiac Enzymes No results for input(s): CKMB, TROPONINI, MYOGLOBIN in the last 168 hours.  Invalid input(s): CK ------------------------------------------------------------------------------------------------------------------    Component Value Date/Time   BNP 303.5 (H) 08/03/2020 0119    Micro Results Recent Results (from the past 240 hour(s))  Resp Panel by RT-PCR (Flu A&B, Covid) Nasopharyngeal Swab     Status: Abnormal   Collection Time: 07/27/20  3:28 PM   Specimen: Nasopharyngeal Swab; Nasopharyngeal(NP) swabs in vial transport medium  Result Value Ref Range Status   SARS Coronavirus 2 by RT PCR POSITIVE (A) NEGATIVE Final    Comment: RESULT CALLED TO, READ BACK BY AND VERIFIED WITH: Wyvonna Plum RN [id number] 07/27/20 A BROWNING (NOTE) SARS-CoV-2 target nucleic acids are DETECTED.  The SARS-CoV-2 RNA is generally detectable in upper respiratory specimens during the acute phase of infection. Positive results are indicative of the presence of the identified virus, but do not rule out bacterial infection or co-infection with other pathogens not detected by the test. Clinical correlation with patient history and other diagnostic information is necessary to determine patient infection status. The expected result is Negative.  Fact  Sheet for Patients: EntrepreneurPulse.com.au  Fact Sheet for Healthcare Providers: IncredibleEmployment.be  This test is not yet approved or cleared by the [place] FDA and  has been authorized for detection and/or diagnosis of SARS-CoV-2 by FDA under an Emergency Use Authorization (EUA).  This EUA will remain in effect (meaning this test can  be used) for the duration of  the COVID-19 declaration under Section 564(b)(1) of the Act, 21 U.S.C. section 360bbb-3(b)(1), unless the authorization is terminated or revoked sooner.     Influenza A by PCR NEGATIVE NEGATIVE Final   Influenza B by PCR NEGATIVE NEGATIVE Final    Comment: (NOTE) The Xpert Xpress SARS-CoV-2/FLU/RSV plus assay is intended as an aid in the diagnosis of influenza from Nasopharyngeal swab specimens and should not be used as a sole basis for treatment. Nasal washings and aspirates are unacceptable for Xpert Xpress SARS-CoV-2/FLU/RSV testing.  Fact Sheet for Patients: EntrepreneurPulse.com.au  Fact Sheet for Healthcare Providers: IncredibleEmployment.be  This test is not yet approved or cleared by the [place] FDA and has been authorized for detection  and/or diagnosis of SARS-CoV-2 by FDA under an Emergency Use Authorization (EUA). This EUA will remain in effect (meaning this test can be used) for the duration of the COVID-19 declaration under Section 564(b)(1) of the Act, 21 U.S.C. section 360bbb-3(b)(1), unless the authorization is terminated or revoked.  Performed at [facility], [place] [address] [place], [place] [address]     Radiology Reports CT ABDOMEN PELVIS W CONTRAST  Result Date: 07/27/2020 CLINICAL DATA:  Elevated liver function studies. EXAM: CT ABDOMEN AND PELVIS WITH CONTRAST TECHNIQUE: Multidetector CT imaging of the abdomen and pelvis was performed using the standard protocol following bolus administration  of intravenous contrast. CONTRAST:  79m OMNIPAQUE IOHEXOL 300 MG/ML  SOLN COMPARISON:  None. FINDINGS: Lower chest: Moderate breathing motion artifact. There are emphysematous changes and pulmonary scarring but no definite infiltrates or effusions. The heart is within normal limits in size. Moderate aortic calcifications. Hepatobiliary: Diffuse and severe fatty infiltration of the liver with focal areas of more pronounced fatty change. No worrisome hepatic lesions or intrahepatic biliary dilatation. The gallbladder is unremarkable. No common bile duct dilatation. Pancreas: Advanced pancreatic atrophy but no mass, inflammation or ductal dilatation. Spleen: Normal size.  No focal lesions. Adrenals/Urinary Tract: Mild nodularity of both adrenal glands but no worrisome lesions. Scattered low-attenuation renal lesions are likely benign cysts. No worrisome renal lesions or hydronephrosis. 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 The bladder is unremarkable. Stomach/Bowel: The stomach, duodenum, small bowel and colon are grossly normal. Vascular/Lymphatic: Advanced atherosclerotic calcifications involving the aorta and branch vessels but no aneurysm. The major venous structures are patent. No mesenteric or retroperitoneal mass or adenopathy. Small scattered lymph nodes are noted. Reproductive: The uterus and ovaries are unremarkable. Other: No pelvic mass or adenopathy. No free pelvic fluid collections. No inguinal mass or adenopathy. No abdominal wall hernia or subcutaneous lesions. Musculoskeletal: No significant bony findings. IMPRESSION: 1. Diffuse and severe fatty infiltration of the liver. 2. No acute abdominal/pelvic findings, mass lesions or adenopathy. 3. Advanced atherosclerotic calcifications involving the aorta and branch vessels. 4. 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 5. Emphysematous changes and pulmonary scarring at the lung bases. 6.  Emphysema and aortic atherosclerosis. Aortic Atherosclerosis (ICD10-I70.0) and Emphysema (ICD10-J43.9). Electronically Signed   By: PMarijo SanesM.D.   On: 07/27/2020 19:46   DG Chest Port 1 View  Result Date: 08/01/2020 CLINICAL DATA:  Shortness of breath.  COVID. EXAM: PORTABLE CHEST 1 VIEW COMPARISON:  CT 01/15/2020.  Chest x-ray 08/18/2019, 05/11/2018. FINDINGS: Mediastinum and hilar structures are normal. COPD. Chronic bilateral interstitial changes are noted. Bibasilar subsegmental atelectasis and or scarring again noted. No acute infiltrate noted. Previously noted pulmonary nodules on CT of 01/16/2020 best evaluated by prior CT. No pleural effusion or pneumothorax. IMPRESSION: 1. COPD. Chronic bilateral interstitial changes and bibasilar subsegmental atelectasis and or scarring. 2. Previously noted pulmonary nodules noted on CT of 01/16/2020 best evaluated by CT. Reference is made to that report. Electronically Signed   By: TMarcello Moores Register   On: 08/01/2020 08:21   DG Chest Portable 1 View  Result Date: 07/27/2020 CLINICAL DATA:  SOB EXAM: PORTABLE CHEST 1 VIEW COMPARISON:  01/16/2020 and prior. FINDINGS: Emphysematous changes. Patchy right predominant basilar opacities. No pneumothorax or pleural effusion. Cardiomediastinal silhouette within normal limits. IMPRESSION: Right predominant basilar opacities, atelectasis versus infiltrate. Emphysema. Electronically Signed   By: CPrimitivo GauzeM.D.   On: 07/27/2020 15:25   VAS [place]OWER EXTREMITY  VENOUS (DVT)  Result Date: 07/30/2020  Lower Venous DVT Study Indications: Covid, d-dimer.  Anticoagulation: Lovenox. Limitations: Poor ultrasound/tissue interface due to superficial skin changes. Comparison Study: 03-20-2020 Prior LT lower extremity venous was negative for                   DVT. Performing Technologist: Darlin Coco RDMS  Examination Guidelines: A complete evaluation includes B-mode imaging, spectral Doppler, color Doppler, and power  Doppler as needed of all accessible portions of each vessel. Bilateral testing is considered an integral part of a complete examination. Limited examinations for reoccurring indications may be performed as noted. The reflux portion of the exam is performed with the patient in reverse Trendelenburg.  +---------+---------------+---------+-----------+----------+-------------------+ RIGHT    CompressibilityPhasicitySpontaneityPropertiesThrombus Aging      +---------+---------------+---------+-----------+----------+-------------------+ CFV      Full           Yes      Yes                                      +---------+---------------+---------+-----------+----------+-------------------+ SFJ      Full                                                             +---------+---------------+---------+-----------+----------+-------------------+ FV Prox  Full                                                             +---------+---------------+---------+-----------+----------+-------------------+ FV Mid   Full                                                             +---------+---------------+---------+-----------+----------+-------------------+ FV DistalFull                                                             +---------+---------------+---------+-----------+----------+-------------------+ PFV      Full                                                             +---------+---------------+---------+-----------+----------+-------------------+ POP      Full           Yes      Yes                                      +---------+---------------+---------+-----------+----------+-------------------+ PTV      Full  Some segments not                                                         well visualized     +---------+---------------+---------+-----------+----------+-------------------+ PERO     Full                                          Some segments not                                                         well visualized     +---------+---------------+---------+-----------+----------+-------------------+   +---------+---------------+---------+-----------+----------+-------------------+ LEFT     CompressibilityPhasicitySpontaneityPropertiesThrombus Aging      +---------+---------------+---------+-----------+----------+-------------------+ CFV      Full           Yes      Yes                                      +---------+---------------+---------+-----------+----------+-------------------+ SFJ      Full                                                             +---------+---------------+---------+-----------+----------+-------------------+ FV Prox  Full                                                             +---------+---------------+---------+-----------+----------+-------------------+ FV Mid   Full                                                             +---------+---------------+---------+-----------+----------+-------------------+ FV DistalFull                                                             +---------+---------------+---------+-----------+----------+-------------------+ PFV      Full                                                             +---------+---------------+---------+-----------+----------+-------------------+ POP      Full  Yes      Yes                                      +---------+---------------+---------+-----------+----------+-------------------+ PTV      Full                                         Some segments not                                                         well visualized     +---------+---------------+---------+-----------+----------+-------------------+ PERO     Full                                         Some segments not                                                          well visualized     +---------+---------------+---------+-----------+----------+-------------------+     Summary: RIGHT: - There is no evidence of deep vein thrombosis in the lower extremity. However, portions of this examination were limited- see technologist comments above.  - No cystic structure found in the popliteal fossa.  LEFT: - There is no evidence of deep vein thrombosis in the lower extremity. However, portions of this examination were limited- see technologist comments above.  - No cystic structure found in the popliteal fossa.  *See table(s) above for measurements and observations. Electronically signed by Ruta Hinds MD on 07/30/2020 at 4:19:16 PM.    Final    ECHOCARDIOGRAM LIMITED  Result Date: 07/28/2020    ECHOCARDIOGRAM LIMITED REPORT   Patient Name:   [name] [facility] Date of Exam: 07/28/2020 Medical Rec #:  [id number]       Height:       62.0 in Accession #:    [id number]      Weight:       108.0 lb Date of Birth:  23-Jan-1946       BSA:          1.471 m Patient Age:    75 years        BP:           114/78 mmHg Patient Gender: F               HR:           83 bpm. Exam Location:  Inpatient Procedure: Limited Echo, Limited Color Doppler and Cardiac Doppler Indications:    atrial fibrillation  History:        Patient has no prior history of Echocardiogram examinations.                 Covid and COPD; Risk Factors:Hypertension.  Sonographer:    Johny Chess Referring Phys: [place]  1. Left ventricular ejection fraction, by estimation, is 55 to  60%. The left ventricle has normal function. The left ventricle has no regional wall motion abnormalities. Left ventricular diastolic parameters were normal.  2. Right ventricular systolic function is normal. The right ventricular size is mildly enlarged.  3. The mitral valve is normal in structure. No evidence of mitral valve regurgitation.  4. The aortic valve is tricuspid. Aortic valve  regurgitation is not visualized. No aortic stenosis is present.  5. The inferior vena cava is dilated in size with >50% respiratory variability, suggesting right atrial pressure of 8 mmHg. Comparison(s): No prior Echocardiogram. FINDINGS  Left Ventricle: Left ventricular ejection fraction, by estimation, is 55 to 60%. The left ventricle has normal function. The left ventricle has no regional wall motion abnormalities. The left ventricular internal cavity size was normal in size. There is  no left ventricular hypertrophy. Left ventricular diastolic parameters were normal. Right Ventricle: The right ventricular size is mildly enlarged. Right ventricular systolic function is normal. Mitral Valve: The mitral valve is normal in structure. Tricuspid Valve: The tricuspid valve is normal in structure. Tricuspid valve regurgitation is not demonstrated. Aortic Valve: The aortic valve is tricuspid. Aortic valve regurgitation is not visualized. No aortic stenosis is present. Aorta: The aortic root is normal in size and structure and the ascending aorta was not well visualized. Venous: The inferior vena cava is dilated in size with greater than 50% respiratory variability, suggesting right atrial pressure of 8 mmHg. LEFT VENTRICLE PLAX 2D LVIDd:         4.40 cm  Diastology LVIDs:         2.50 cm  LV e' medial:    10.60 cm/s LV PW:         0.80 cm  LV E/e' medial:  7.9 LV IVS:        0.70 cm  LV e' lateral:   11.50 cm/s LVOT diam:     1.90 cm  LV E/e' lateral: 7.3 LV SV:         66 LV SV Index:   45 LVOT Area:     2.84 cm  IVC IVC diam: 2.40 cm LEFT ATRIUM         Index LA diam:    3.70 cm 2.51 cm/m  AORTIC VALVE LVOT Vmax:   137.00 cm/s LVOT Vmean:  87.500 cm/s LVOT VTI:    0.233 m  AORTA Ao Root diam: 2.90 cm MITRAL VALVE MV Area (PHT): 4.10 cm    SHUNTS MV Decel Time: 185 msec    Systemic VTI:  0.23 m MV E velocity: 83.60 cm/s  Systemic Diam: 1.90 cm MV A velocity: 71.10 cm/s MV E/A ratio:  1.18 Rudean Haskell MD  Electronically signed by Rudean Haskell MD Signature Date/Time: 07/28/2020/5:07:15 PM    Final

## 2020-08-03 NOTE — Plan of Care (Signed)
  Problem: Elimination: Goal: Will not experience complications related to bowel motility Outcome: Progressing   Problem: Activity: Goal: Risk for activity intolerance will decrease Outcome: Not Progressing   Problem: Nutrition: Goal: Adequate nutrition will be maintained Outcome: Not Progressing

## 2020-08-03 NOTE — Progress Notes (Addendum)
Daily Progress Note   Patient Name: [name]       Date: 08/03/2020 DOB: 03-Feb-1946  Age: 75 y.o. MRN#: [id number] Attending Physician: Thurnell Lose, MD Primary Care Physician: Christain [place], MD Admit Date: 07/27/2020  Reason for Consultation/Follow-up: Disposition and Establishing goals of care  Subjective: Chart review performed. Received update from RN - no acute concerns. Received update from PT - patient is weak, needs assistance to stand/transfer, recommending 24 hr supervision on discharge.  Went to visit patient at bedside - no family/visitors present. Patient was sitting up to chair with lunch tray in front of her; however, not eating. Patient states she does not feel well at all today. Her biggest concern is 9/10 pain in her neck/chest/feet - she received tylenol this morning but states it did not provide pain relief. She does endorse shortness of breath and generalized discomfort. She is on 5L O2 today.   Reviewed conversation/information provided from PMT visit yesterday as well as discussed thoughts around information for continued [place] discussion. Patient states that she has not felt well enough or had energy to really consider/think about options/information from yesterday - she states she only remembers parts of [place] conversation yesterday. We briefly reviewed disposition options again.   Aggressive interventions vs a comfort path were considered in light of the patient's goals. Patient states that her goal is to feel better. I re-discussed the concept of a comfort path to [name] and encouraged her to think about at what point she would want to stop aggressive medical interventions and focus on comfort and dignity rather than cure/prolonging life. Introduced hospice  philosophy. We also talked about transition to comfort measures in house and what that would entail inclusive of medications to control pain, dyspnea, agitation, nausea, itching, and hiccups. We discussed stopping all unnecessary measures such as blood draws, needle sticks, oxygen, antibiotics, CBGs/insulin, cardiac monitoring, and frequent vital signs.   Patient is not ready to make any final decisions today. She is ok continuing current medical treatment for now while considering options.   All questions and concerns addressed. She is agreeable to PMT follow up visit tomorrow.    Length of Stay: 7  Current Medications: Scheduled Meds:  . aspirin  81 mg Oral Daily  . busPIRone  5 mg Oral BID  .  diltiazem  60 mg Oral Q6H  . enoxaparin (LOVENOX) injection  40 mg Subcutaneous Q24H  . feeding supplement  237 mL Oral TID BM  . folic acid  1 mg Oral Daily  . isosorbide mononitrate  30 mg Oral Daily  . levalbuterol  2 puff Inhalation Q6H  . mouth rinse  15 mL Mouth Rinse BID  . methylPREDNISolone (SOLU-MEDROL) injection  40 mg Intravenous BID  . mometasone-formoterol  2 puff Inhalation BID  . multivitamin with minerals  1 tablet Oral Daily  . pantoprazole  40 mg Oral Q1200  . thiamine  100 mg Oral Daily   Or  . thiamine  100 mg Intravenous Daily  . umeclidinium bromide  1 puff Inhalation Daily    Continuous Infusions:   PRN Meds: acetaminophen, albuterol, benzonatate, guaiFENesin-dextromethorphan, lip balm, ondansetron (ZOFRAN) IV  Physical Exam Vitals and nursing note reviewed.  Constitutional:      General: She is not in acute distress.    Appearance: She is cachectic. She is ill-appearing.  Pulmonary:     Effort: No respiratory distress.     Comments: Shortness of breath Skin:    General: Skin is warm and dry.  Neurological:     Mental Status: She is alert and oriented to person, place, and time.     Motor: Weakness present.  Psychiatric:        Attention and  Perception: Attention normal.        Behavior: Behavior is cooperative.        Cognition and Memory: Cognition and memory normal.             Vital Signs: BP (!) 110/55 (BP Location: Right Arm)   Pulse 82   Temp 98.5 F (36.9 C) (Axillary) Comment: pt drank iced soda so took axillary v. oral  Resp 19   Ht _0  (1.575 m)   Wt 50.2 kg   SpO2 98%   BMI 20.24 kg/m  SpO2: SpO2: 98 % O2 Device: O2 Device: Nasal Cannula O2 Flow Rate: O2 Flow Rate (L/min): 5 L/min  Intake/output summary:   Intake/Output Summary (Last 24 hours) at 08/03/2020 1223 Last data filed at 08/03/2020 5102 Gross per 24 hour  Intake --  Output 750 ml  Net -750 ml   LBM: Last BM Date: 08/02/20 Baseline Weight: Weight: 49 kg Most recent weight: Weight: 50.2 kg       Palliative Assessment/Data: PPS 30%      Patient Active Problem List   Diagnosis Date Noted  . New onset a-fib ([place]) 07/27/2020  . COVID-19 virus infection 07/27/2020  . Chest pain, musculoskeletal 02/14/2020  . Need for vaccination 10/05/2019  . Rheumatoid factor positive 09/19/2019  . 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 Protein-calorie malnutrition, severe 08/20/2019  . Severe sepsis ([place]) 08/19/2019  . Acute lower UTI 08/19/2019  . Pressure injury of skin 08/19/2019  . ARF (acute renal failure) ([place]) 08/18/2019  . Hyperkalemia 08/18/2019  . Transaminitis 08/18/2019  . Generalized weakness 08/18/2019  . Leukocytosis 08/18/2019  . Anemia 08/18/2019  . Acute renal failure (ARF) ([place]) 08/18/2019  . Chronic respiratory failure with hypoxia and hypercapnia ([place]) 06/02/2018  . COPD with acute exacerbation ([place]) 05/09/2018  . Hyponatremia 05/09/2018  . Acute urinary retention 05/09/2018  . Cystocele with prolapse 03/02/2018  . Cor pulmonale, chronic ([place]) 06/29/2017  . Chronic respiratory failure with hypoxia ([place]) 06/30/2014  . Pulmonary infiltrates 01/26/2013  . Multiple pulmonary nodules determined by computed tomography of  lung 08/19/2012  .  Cancer screening 05/12/2012  . COPD exacerbation ([place]) 05/12/2012  . Essential hypertension 03/28/2012  . COPD GOLD III 12/02/2011  . Chronic cough 12/02/2011  . Cigarette smoker 12/02/2011    Palliative Care Assessment & Plan   Patient Profile: 75 y.o. female  with past medical history of advanced COPD on 3L oxygen at home, atrial fibrillation, failure to thrive, severe protein calorie malnutrition, pulmonary hypertension, and CAD.  She was brought in the emergency department on 07/27/2020 with shortness of breath and weakness. Associated symptoms included palpitations and worsening productive cough.  ED Course: Found to be in A-fib with RVR. COVID-19 positive. Chest x-ray showing right predominant basilar opacities with atelectasis versus infiltrates. CT abdomen pelvis showed diffuse and severely fatty infiltration of the liver. Patient is tremulous and has a history of alcohol abuse. Patient was admitted to [facility] for further management of a-fib RVR, acute on chronic hypoxic respiratory failure, and alcohol withdraw.   Assessment: COPD exacerbation COVID19 pneumonia Atrial fibrillation with RVR Acute alcoholic hepatitis Deconditioning Fragility   Recommendations/Plan:  Continue current medical treatment  Continue DNR/DNI as previously documented  Patient requests her friend/Sonja Setzer as healthcare proxy; HCPOA cannot be completed until COVID isolation precautions are removed  Patient still considering disposition options - home with hospice would be very appropriate considering her advanced COPD and need for symptom management  Restarted home med: norco 7.5-325mg tab q6hr PRN moderate pain  Started morphine 60m IV q4hr PRN severe pain/dyspnea  PMT will continue to follow and support holistically  **ADDENDUM** Called and spoke with patient's friend/Sonja - provided updates from today. Reviewed recommendations from PT/OT on 24 hour supervision needs.  Reviewed disposition options per her request including home hospice vs rehab. Reviewed home hospice services in detail and educated that patient would need someone to stay with her, or she go to live with someone, while receiving home hospice services. SBecky Saxis unsure about having 24 hr support for her at this time and requested time to discuss with others. We discussed option of discharging to rehab with ultimate goal to transition to hospice - recommendation was given for outpatient Palliative Care with this option (education provided on outpatient PC services). Prognostication was reviewed per her request - explained I would not be surprised if the patient was no longer living in 6 months. Reviewed that patient is at high risk for rehospitalization for symptoms if she does not discharge with hospice. Answered questions regarding HCPOA and financial POA - explained that unfortunately, until Ms. [name] is taken off isolation precautions for COVID, we cannot complete HCPOA in house. I recommended speaking to lawyer to get this paperwork completed. Sonja expressed understanding of information given to her today as well as appreciation. All questions and concerns addressed. Encouraged to call with questions and/or concerns. PMT number previously provided.   Goals of Care and Additional Recommendations:  Limitations on Scope of Treatment: Full Scope Treatment  Code Status:    Code Status Orders  (From admission, onward)         Start     Ordered   08/02/20 1924  Do not attempt resuscitation (DNR)  Continuous       Question Answer Comment  In the event of cardiac or respiratory ARREST Do not call a "code blue"   In the event of cardiac or respiratory ARREST Do not perform Intubation, CPR, defibrillation or ACLS   In the event of cardiac or respiratory ARREST Use medication by any route, position, wound care, and other  measures to relive pain and suffering. May use oxygen, suction and manual treatment  of airway obstruction as needed for comfort.      08/02/20 1923        Code Status History    Date Active Date Inactive Code Status Order ID Comments User Context   07/27/2020 2310 08/02/2020 1923 Full Code [id number]  Elwyn Reach, MD ED   08/18/2019 2322 08/24/2019 2039 Full Code [id number]  Rhetta Mura, DO ED   05/09/2018 0211 05/20/2018 1706 Full Code [id number]  Vianne Bulls, MD ED   03/02/2018 1028 03/03/2018 1351 Full Code [id number]  Cheri Fowler, MD Inpatient   08/19/2012 2119 08/21/2012 1728 Full Code [id number]  Samuella Cota, MD Inpatient   Advance Care Planning Activity       Prognosis:   < 6 months  Discharge Planning:  To Be Determined  Care plan was discussed with primary RN, PT, patient  Thank you for allowing the Palliative Medicine Team to assist in the care of this patient.   Total Time 55 minutes Prolonged Time Billed  no       Greater than 50%  of this time was spent counseling and coordinating care related to the above assessment and plan.  Lin Landsman, NP  Please contact Palliative Medicine Team phone at [phone] for questions and concerns.

## 2020-08-03 NOTE — Progress Notes (Signed)
Physical Therapy Treatment Patient Details Name: [name] MRN: [id number] DOB: 1945-12-21 Today's Date: 08/03/2020    History of Present Illness Pt is a 75 y.o. female admitted 07/27/20 with worsening cough and SOB; tested (+) COVID-19. Workup for afib with RVR, COPD exacerbation, potential COVID-19 PNA. PMH includes COPD (wears 3L O2 baseline), pulmonary HTN, ETOH use, anemia, exzematous dermatitis.   PT Comments    Pt slowly progressing with mobility; remains limited by significantly decreased activity tolerance, generalized weakness and impaired balance, requires prolonged seated rest to recover from DOE 4/4 with minimal activity. Pt able to perform standing tranfers with modA. If to return home, will require w/c for mobility and assist for bed<>BSC<>w/c transfers, as well as all ADL tasks (discussion regarding this with Palliative NP). Will continue to follow acutely to address established goals.  SpO2 >/93% on 5L O2 [place]    Follow Up Recommendations  SNF;Supervision/Assistance - 24 hour (vs. Hospice)     Equipment Recommendations  Wheelchair (measurements PT);Wheelchair cushion (measurements PT);3in1 (PT) (may already have 3in1)    Recommendations for Other Services       Precautions / Restrictions Precautions Precautions: Fall;Other (comment) Precaution Comments: H/o COPD wears 3-4L O2 baseline Restrictions Weight Bearing Restrictions: No    Mobility  Bed Mobility Overal bed mobility: Needs Assistance Bed Mobility: Supine to Sit     Supine to sit: Supervision;HOB elevated     General bed mobility comments: Increased time and effort, no physical assist required; seated rest at EOB, limited by fatigue and SOB    Transfers Overall transfer level: Needs assistance Equipment used: 1 person hand held assist Transfers: Sit to/from [facility] Sit to Stand: Mod assist Stand pivot transfers: Mod assist       General transfer comment: ModA with HHA  for trunk elevation and maintaining posture; pt keeping forward flexed posture, reaching to recliner for support of other UE, able to take a couple pivotal steps with modA to maintain balance; significant fatigue and SOB after this  Ambulation/Gait             General Gait Details: Unable beyond pivotal steps for transfer   Stairs             Wheelchair Mobility    Modified Rankin (Stroke Patients Only)       Balance Overall balance assessment: Needs assistance Sitting-balance support: No upper extremity supported;Feet supported Sitting balance-Leahy Scale: Fair     Standing balance support: Bilateral upper extremity supported;During functional activity;Single extremity supported Standing balance-Leahy Scale: Poor Standing balance comment: Reliant on UE support and external assist                            Cognition Arousal/Alertness: Awake/alert Behavior During Therapy: WFL for tasks assessed/performed;Flat affect Overall Cognitive Status: No family/caregiver present to determine baseline cognitive functioning Area of Impairment: Attention;Following commands;Safety/judgement;Awareness;Problem solving;Memory                   Current Attention Level: Sustained;Selective Memory: Decreased short-term memory Following Commands: Follows one step commands with increased time Safety/Judgement: Decreased awareness of deficits Awareness: Emergent Problem Solving: Slow processing;Requires verbal cues General Comments: Initially flat, agreeable to get to chair, then when asked to sit up she said "Where are we going? I can't get out of this bed" - more alert and appropriate once seated in recliner. Some anxiety related to SOB, although this has improved since prior session. Limited by  fatigue and visibly frustrated by current condition ("It's just not right")      Exercises      General Comments General comments (skin integrity, edema, etc.): SpO2  >/93% on 5L O2 [place]      Pertinent Vitals/Pain Pain Assessment: Faces Faces Pain Scale: Hurts a little bit Pain Location: Generalized, specifically chest with coughing/breathing Pain Descriptors / Indicators: Tiring;Discomfort Pain Intervention(s): Monitored during session;Limited activity within patient's tolerance    Home Living                      Prior Function            PT Goals (current goals can now be found in the care plan section) Acute Rehab PT Goals Patient Stated Goal: "What's the point, I'm going to go home to die" PT Goal Formulation: With patient Time For Goal Achievement: 08/12/20 Potential to Achieve Goals: Fair Progress towards PT goals: Progressing toward goals (slowly)    Frequency    Min 3X/week      PT Plan Current plan remains appropriate    Co-evaluation              AM-PAC PT "6 Clicks" Mobility   Outcome Measure  Help needed turning from your back to your side while in a flat bed without using bedrails?: None Help needed moving from lying on your back to sitting on the side of a flat bed without using bedrails?: A Little Help needed moving to and from a bed to a chair (including a wheelchair)?: A Lot Help needed standing up from a chair using your arms (e.g., wheelchair or bedside chair)?: A Lot Help needed to walk in hospital room?: A Lot Help needed climbing 3-5 steps with a railing? : Total 6 Click Score: 14    End of Session Equipment Utilized During Treatment: Oxygen Activity Tolerance: Patient limited by fatigue;Treatment limited secondary to medical complications (Comment) Patient left: in chair;with call bell/phone within reach Nurse Communication: Mobility status PT Visit Diagnosis: Unsteadiness on feet (R26.81);Muscle weakness (generalized) (M62.81);Difficulty in walking, not elsewhere classified (R26.2)     Time: 6256-3893 PT Time Calculation (min) (ACUTE ONLY): 22 min  Charges:  $Therapeutic Activity:  8-22 mins                     Mabeline Caras, PT, DPT Acute Rehabilitation Services  Pager [phone] Office [place] 08/03/2020, 1:41 PM

## 2020-08-04 DIAGNOSIS — U071 COVID-19: Secondary | ICD-10-CM | POA: Diagnosis not present

## 2020-08-04 DIAGNOSIS — J9611 Chronic respiratory failure with hypoxia: Secondary | ICD-10-CM | POA: Diagnosis not present

## 2020-08-04 DIAGNOSIS — J441 Chronic obstructive pulmonary disease with (acute) exacerbation: Secondary | ICD-10-CM | POA: Diagnosis not present

## 2020-08-04 DIAGNOSIS — I4891 Unspecified atrial fibrillation: Secondary | ICD-10-CM | POA: Diagnosis not present

## 2020-08-04 MED ORDER — FUROSEMIDE 40 MG PO TABS
40.0000 mg | ORAL_TABLET | Freq: Once | ORAL | Status: AC
  Administered 2020-08-04: 40 mg via ORAL
  Filled 2020-08-04: qty 1

## 2020-08-04 MED ORDER — METOPROLOL TARTRATE 5 MG/5ML IV SOLN
2.5000 mg | Freq: Once | INTRAVENOUS | Status: AC
  Administered 2020-08-04: 2.5 mg via INTRAVENOUS
  Filled 2020-08-04: qty 5

## 2020-08-04 MED ORDER — POTASSIUM CHLORIDE CRYS ER 20 MEQ PO TBCR
20.0000 meq | EXTENDED_RELEASE_TABLET | Freq: Once | ORAL | Status: AC
  Administered 2020-08-04: 20 meq via ORAL
  Filled 2020-08-04: qty 1

## 2020-08-04 NOTE — Progress Notes (Signed)
PROGRESS NOTE                                                                                                                                                                                                             Patient Demographics:    Ben Sanz, is a 75 y.o. female, DOB - August 28, 1945, ULA:[id number]  Outpatient Primary MD for the patient is Christain Sacramento, MD   Admit date - 07/27/2020   LOS - 8  Chief Complaint  Patient presents with  . Shortness of Breath  . Chest Pain    Started two weeks ago with progression to severe difficulty breathing and "bad cp"       Brief Narrative: Patient is a 75 y.o. female with PMHx of EtOH use, anemia of chronic disease, eczematous dermatitis, COPD on 3 L of oxygen at home, pulmonary hypertension-who presented with worsening cough-shortness of breath-found to have A. fib with RVR.  Started on Cardizem and heparin infusion-subsequently admitted to the hospitalist service.  COVID-19 vaccinated status: Vaccinated but not boosted  Significant Events: 2/12>> Admit to [facility] for COPD exacerbation-A. fib RVR-COVID-19 positive  Significant studies: 2/11>>Chest x-ray: Right> left basilar opacities 2/11>> CT abdomen/pelvis: No acute abdominal/pelvic findings, diffuse/severe fatty infiltration of liver 2/12>> Leg [place] - No DVT 2/13 TTE - IMPRESSIONS  1. Left ventricular ejection fraction, by estimation, is 55 to 60%. The left ventricle has normal function. The left ventricle has no regional wall motion abnormalities. Left ventricular diastolic parameters were normal.  2. Right ventricular systolic function is normal. The right ventricular size is mildly enlarged.  3. The mitral valve is normal in structure. No evidence of mitral valve regurgitation.  4. The aortic valve is tricuspid. Aortic valve regurgitation is not visualized. No aortic stenosis is present.  5. The inferior vena cava is  dilated in size with >50% respiratory variability, suggesting right atrial pressure of 8 mmHg  COVID-19 medications: Steroids: 2/12>>  Antibiotics: Doxycycline: 2/12>>  Microbiology data: None  Procedures: None  Consults: Cardiology, Pall Care  DVT prophylaxis: enoxaparin (LOVENOX) injection 40 mg Start: 07/29/20 1000 SCDs Start: 07/27/20 2311     Subjective:   Patient in bed, appears comfortable, denies any headache, no fever, no chest pain or pressure, improved shortness of breath , no abdominal pain. No focal weakness.    Assessment  &  Plan :   COPD exacerbation: Probably triggered by COVID-19 infection-now on IV Solu-Medrol and doxycycline, scheduled Xopenex-add Incruse inhaler.  She is stable-moving air-appears comfortable.  Back on her usual 3-4 L of oxygen (home regimen), single dose Lasix on 08/02/2020 as she has some rales on exam, note she has mild chronic shortness of breath due to advanced underlying COPD, already appears extremely frail and cachectic, long-term prognosis does not look good, palliative care involved patient now deciding between residential and home hospice versus home with palliative care.  She will make her decision in the next few days.  ?COVID-19 PNA: Possible pneumonic changes in x-ray chest-CRP elevated-however LFTs are significantly elevated.  Will treat with steroids-avoid Remdesivir due to significantly elevated LFTs.  Hypoxemia is at baseline-on usual 3 L of oxygen-no role for immunomodulators at this time.  Recent Labs  Lab 07/29/20 0305 07/29/20 0811 07/29/20 0811 07/30/20 0146 07/31/20 0430 08/01/20 0204 08/02/20 0045 08/03/20 0119  WBC 8.0  --   --  9.5 11.7* 9.2 11.0* 10.6*  HGB 7.6*  --   --  7.6* 8.3* 8.2* 8.5* 8.3*  HCT 21.9*  --   --  22.9* 26.5* 26.1* 26.6* 25.9*  PLT 121*  --   --  117* 121* 111* 124* 136*  CRP 4.9*  --   --  2.1* 1.0* 0.7 0.6 0.6  BNP  --  530.6*   < > 425.0* 561.1* 468.9* 236.8* 303.5*  DDIMER 3.01*   --   --  2.99* 3.14* 3.04*  --   --   AST 467*  --   --  184* 70* 51* 50* 46*  ALT 236*  --   --  176* 135* 105* 104* 91*  ALKPHOS 137*  --   --  118 121 101 99 86  BILITOT 0.8  --   --  0.9 0.8 0.7 0.6 0.3  ALBUMIN 2.6*  --   --  2.5* 2.6* 2.4* 2.6* 2.6*  INR  --  0.9  --  1.0 0.9 0.9 1.0  --    < > = values in this interval not displayed.      PAF with RVR: Back in sinus rhythm-stop Cardizem infusion-starting short acting Cardizem-on IV heparin currently.  She is very frail-lives alone-alcohol use-has chronic normocytic anemia-I agree with cardiology that she is a poor long-term candidate for anticoagulation.  Probably better to stick with aspirin.  Echo noted, on aspirin only now.  Elevated D-dimer. -ve leg ultrasound, on prophylactic Lovenox, monitor, poor candidate for anticoagulation.    Acute alcoholic hepatitis: LFTs significantly elevated-but seems to be slowly downtrending-discriminant function only 5.  No role for steroids-CT abdomen with significant fatty infiltration.  Supportive care-avoid hepatotoxic medications.  Follow LFTs  EtOH withdrawal: Tremulous-but awake/alert.  Claims that she drinks at least one 12 ounce beer-along with 3-4 shots of liquor on a daily basis.  Last drink was 2/11.  No signs of DTs.  Normocytic anemia: Anemia panel suggestive of anemia of chronic disease, supportive care.  History of pulmonary hypertension: Due to COPD-outpatient follow-up with pulmonary.  Continue oxygen supplementation as needed.  COPD appears extremely advanced and terminal.  Deconditioning/debility/frailty: SNF once stable.  Depression: Hold bupropion-allow LFTs to improve further before initiating.  Eczematous dermatitis-keratotic lesions in left breast/bilateral lower extremities - outpatient dermatology follow-up.   RN pressure injury documentation: Pressure Injury 08/19/19 Sacrum Mid Stage 2 -  Partial thickness loss of dermis presenting as a shallow open injury with a  red, pink  wound bed without slough. (Active)  08/19/19 0200  Location: Sacrum  Location Orientation: Mid  Staging: Stage 2 -  Partial thickness loss of dermis presenting as a shallow open injury with a red, pink wound bed without slough.  Wound Description (Comments):   Present on Admission: Yes    GI prophylaxis: PPI   Condition - Stable  Family Communication  :  Daughter-[phone] previous MD updated over the phone on 2/12- agreed with avoiding anticoagulation given alcohol use/safety-understands embolic/CVA risk, updated 07/29/2020 essential left at 11:37 AM, 08/01/20 message left at 11:13 AM.  Patient now wishes her daughter not to be involved in patient's care.  Code Status :  Full Code  Diet :  Diet Order            DIET DYS 2 Room service appropriate? Yes; Fluid consistency: Thin  Diet effective now                  Disposition Plan  :  Status is: Inpatient  Remains inpatient appropriate because:Inpatient level of care appropriate due to severity of illness  Dispo: The patient is from: Home              Anticipated d/c is to: SNF              Anticipated d/c date is: > 3 days              Patient currently is not medically stable to d/c.   Difficult to place patient No  Barriers to discharge: COPD exacerbation on IV steroids, COVID-19 infection, alcoholic hepatitis-needs continued inpatient monitoring/treatment.  Antimicorbials  :    Anti-infectives (From admission, onward)   Start     Dose/Rate Route Frequency Ordered Stop   07/28/20 1000  doxycycline (VIBRA-TABS) tablet 100 mg        100 mg Oral Every 12 hours 07/28/20 0813 08/01/20 1741      Inpatient Medications  Scheduled Meds: . aspirin  81 mg Oral Daily  . busPIRone  5 mg Oral BID  . diltiazem  60 mg Oral Q6H  . enoxaparin (LOVENOX) injection  40 mg Subcutaneous Q24H  . feeding supplement  237 mL Oral TID BM  . folic acid  1 mg Oral Daily  . isosorbide mononitrate  30 mg Oral Daily  .  levalbuterol  2 puff Inhalation Q6H  . mouth rinse  15 mL Mouth Rinse BID  . methylPREDNISolone (SOLU-MEDROL) injection  40 mg Intravenous BID  . mometasone-formoterol  2 puff Inhalation BID  . multivitamin with minerals  1 tablet Oral Daily  . pantoprazole  40 mg Oral Q1200  . thiamine  100 mg Oral Daily   Or  . thiamine  100 mg Intravenous Daily  . umeclidinium bromide  1 puff Inhalation Daily   Continuous Infusions:  PRN Meds:.acetaminophen, albuterol, benzonatate, guaiFENesin-dextromethorphan, HYDROcodone-acetaminophen, lip balm, morphine injection, ondansetron (ZOFRAN) IV   Time Spent in minutes  25  See all Orders from today for further details   Lala Lund M.D on 08/04/2020 at 11:33 AM  To page go to [email or web address] - use universal password  [facility] -  Office  [phone]    Objective:   Vitals:   08/04/20 0218 08/04/20 0400 08/04/20 0600 08/04/20 1105  BP: 113/71 117/69 127/74 124/64  Pulse: (!) 128 91 88 92  Resp: _0 Temp: 98 F (36.7 C) 97.9 F (36.6 C) 97.8 F (36.6 C) 98.7 F (37.1 C)  TempSrc:  Oral Oral Oral Axillary  SpO2: 99% 100% 100% 98%  Weight:      Height:        Wt Readings from Last 3 Encounters:  07/30/20 50.2 kg  06/22/20 51.5 kg  02/13/20 50.2 kg     Intake/Output Summary (Last 24 hours) at 08/04/2020 1133 Last data filed at 08/03/2020 1730 Gross per 24 hour  Intake 240 ml  Output 200 ml  Net 40 ml     Physical Exam  Extremely frail, cachectic elderly white female, in no acute distress, no focal deficits [place].AT,PERRAL Supple Neck,No JVD, No cervical lymphadenopathy appriciated.  Symmetrical Chest wall movement, Good air movement bilaterally, CTAB RRR,No Gallops, Rubs or new Murmurs, No Parasternal Heave +ve B.Sounds, Abd Soft, No tenderness, No organomegaly appriciated, No rebound - guarding or rigidity. Chronic diffuse bilateral upper extremity bruising from previous lab draws, chronic scaly rash both  legs & L.chest wall   Data Review:    CBC  Recent Labs  Lab 07/30/20 0146 07/31/20 0430 08/01/20 0204 08/02/20 0045 08/03/20 0119  WBC 9.5 11.7* 9.2 11.0* 10.6*  HGB 7.6* 8.3* 8.2* 8.5* 8.3*  HCT 22.9* 26.5* 26.1* 26.6* 25.9*  PLT 117* 121* 111* 124* 136*  MCV 97.0 99.6 99.6 98.5 99.2  MCH 32.2 31.2 31.3 31.5 31.8  MCHC 33.2 31.3 31.4 32.0 32.0  RDW 18.1* 18.3* 18.5* 18.7* 19.3*    Chemistries  Recent Labs  Lab 07/30/20 0146 07/31/20 0430 08/01/20 0204 08/02/20 0045 08/03/20 0119  NA 138 138 137 137 138  K 3.8 3.8 3.9 3.7 3.9  CL 91* 93* 93* 89* 88*  CO2 38* 38* 37* 39* 38*  GLUCOSE 225* 195* 200* 224* 229*  BUN _0 27* 30*  CREATININE 0.51 0.47 0.47 0.90 0.72  CALCIUM 8.5* 8.5* 8.5* 8.6* 8.9  MG 2.1 1.9 1.9 2.1 2.0  AST 184* 70* 51* 50* 46*  ALT 176* 135* 105* 104* 91*  ALKPHOS 118 121 101 99 86  BILITOT 0.9 0.8 0.7 0.6 0.3   ------------------------------------------------------------------------------------------------------------------ No results for input(s): CHOL, HDL, LDLCALC, TRIG, CHOLHDL, LDLDIRECT in the last 72 hours.  Lab Results  Component Value Date   HGBA1C 6.1 (H) 05/13/2018   ------------------------------------------------------------------------------------------------------------------ No results for input(s): TSH, T4TOTAL, T3FREE, THYROIDAB in the last 72 hours.  Invalid input(s): FREET3 ------------------------------------------------------------------------------------------------------------------ No results for input(s): VITAMINB12, FOLATE, FERRITIN, TIBC, IRON, RETICCTPCT in the last 72 hours.  Coagulation profile Recent Labs  Lab 07/29/20 0811 07/30/20 0146 07/31/20 0430 08/01/20 0204 08/02/20 0045  INR 0.9 1.0 0.9 0.9 1.0    No results for input(s): DDIMER in the last 72 hours.  Cardiac Enzymes No results for input(s): CKMB, TROPONINI, MYOGLOBIN in the last 168 hours.  Invalid input(s):  CK ------------------------------------------------------------------------------------------------------------------    Component Value Date/Time   BNP 303.5 (H) 08/03/2020 0119    Micro Results Recent Results (from the past 240 hour(s))  Resp Panel by RT-PCR (Flu A&B, Covid) Nasopharyngeal Swab     Status: Abnormal   Collection Time: 07/27/20  3:28 PM   Specimen: Nasopharyngeal Swab; Nasopharyngeal(NP) swabs in vial transport medium  Result Value Ref Range Status   SARS Coronavirus 2 by RT PCR POSITIVE (A) NEGATIVE Final    Comment: RESULT CALLED TO, READ BACK BY AND VERIFIED WITH: Wyvonna Plum RN 2330 07/27/20 A BROWNING (NOTE) SARS-CoV-2 target nucleic acids are DETECTED.  The SARS-CoV-2 RNA is generally detectable in upper respiratory specimens during the acute phase of infection. Positive results are indicative of the  presence of the identified virus, but do not rule out bacterial infection or co-infection with other pathogens not detected by the test. Clinical correlation with patient history and other diagnostic information is necessary to determine patient infection status. The expected result is Negative.  Fact Sheet for Patients: EntrepreneurPulse.com.au  Fact Sheet for Healthcare Providers: IncredibleEmployment.be  This test is not yet approved or cleared by the [place] FDA and  has been authorized for detection and/or diagnosis of SARS-CoV-2 by FDA under an Emergency Use Authorization (EUA).  This EUA will remain in effect (meaning this test can  be used) for the duration of  the COVID-19 declaration under Section 564(b)(1) of the Act, 21 U.S.C. section 360bbb-3(b)(1), unless the authorization is terminated or revoked sooner.     Influenza A by PCR NEGATIVE NEGATIVE Final   Influenza B by PCR NEGATIVE NEGATIVE Final    Comment: (NOTE) The Xpert Xpress SARS-CoV-2/FLU/RSV plus assay is intended as an aid in the diagnosis of  influenza from Nasopharyngeal swab specimens and should not be used as a sole basis for treatment. Nasal washings and aspirates are unacceptable for Xpert Xpress SARS-CoV-2/FLU/RSV testing.  Fact Sheet for Patients: EntrepreneurPulse.com.au  Fact Sheet for Healthcare Providers: IncredibleEmployment.be  This test is not yet approved or cleared by the [place] FDA and has been authorized for detection and/or diagnosis of SARS-CoV-2 by FDA under an Emergency Use Authorization (EUA). This EUA will remain in effect (meaning this test can be used) for the duration of the COVID-19 declaration under Section 564(b)(1) of the Act, 21 U.S.C. section 360bbb-3(b)(1), unless the authorization is terminated or revoked.  Performed at [facility], [place] [address] [place], [place] [address]     Radiology Reports CT ABDOMEN PELVIS W CONTRAST  Result Date: 07/27/2020 CLINICAL DATA:  Elevated liver function studies. EXAM: CT ABDOMEN AND PELVIS WITH CONTRAST TECHNIQUE: Multidetector CT imaging of the abdomen and pelvis was performed using the standard protocol following bolus administration of intravenous contrast. CONTRAST:  51m OMNIPAQUE IOHEXOL 300 MG/ML  SOLN COMPARISON:  None. FINDINGS: Lower chest: Moderate breathing motion artifact. There are emphysematous changes and pulmonary scarring but no definite infiltrates or effusions. The heart is within normal limits in size. Moderate aortic calcifications. Hepatobiliary: Diffuse and severe fatty infiltration of the liver with focal areas of more pronounced fatty change. No worrisome hepatic lesions or intrahepatic biliary dilatation. The gallbladder is unremarkable. No common bile duct dilatation. Pancreas: Advanced pancreatic atrophy but no mass, inflammation or ductal dilatation. Spleen: Normal size.  No focal lesions. Adrenals/Urinary Tract: Mild nodularity of both adrenal glands but no worrisome lesions.  Scattered low-attenuation renal lesions are likely benign cysts. No worrisome renal lesions or hydronephrosis. 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 The bladder is unremarkable. Stomach/Bowel: The stomach, duodenum, small bowel and colon are grossly normal. Vascular/Lymphatic: Advanced atherosclerotic calcifications involving the aorta and branch vessels but no aneurysm. The major venous structures are patent. No mesenteric or retroperitoneal mass or adenopathy. Small scattered lymph nodes are noted. Reproductive: The uterus and ovaries are unremarkable. Other: No pelvic mass or adenopathy. No free pelvic fluid collections. No inguinal mass or adenopathy. No abdominal wall hernia or subcutaneous lesions. Musculoskeletal: No significant bony findings. IMPRESSION: 1. Diffuse and severe fatty infiltration of the liver. 2. No acute abdominal/pelvic findings, mass lesions or adenopathy. 3. Advanced atherosclerotic calcifications involving the aorta and branch vessels. 4. 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 5. Emphysematous  changes and pulmonary scarring at the lung bases. 6. Emphysema and aortic atherosclerosis. Aortic Atherosclerosis (ICD10-I70.0) and Emphysema (ICD10-J43.9). Electronically Signed   By: Marijo Sanes M.D.   On: 07/27/2020 19:46   DG Chest Port 1 View  Result Date: 08/01/2020 CLINICAL DATA:  Shortness of breath.  COVID. EXAM: PORTABLE CHEST 1 VIEW COMPARISON:  CT 01/15/2020.  Chest x-ray 08/18/2019, 05/11/2018. FINDINGS: Mediastinum and hilar structures are normal. COPD. Chronic bilateral interstitial changes are noted. Bibasilar subsegmental atelectasis and or scarring again noted. No acute infiltrate noted. Previously noted pulmonary nodules on CT of 01/16/2020 best evaluated by prior CT. No pleural effusion or pneumothorax. IMPRESSION: 1. COPD. Chronic bilateral interstitial changes and bibasilar subsegmental atelectasis and or  scarring. 2. Previously noted pulmonary nodules noted on CT of 01/16/2020 best evaluated by CT. Reference is made to that report. Electronically Signed   By: Marcello Moores  Register   On: 08/01/2020 08:21   DG Chest Portable 1 View  Result Date: 07/27/2020 CLINICAL DATA:  SOB EXAM: PORTABLE CHEST 1 VIEW COMPARISON:  01/16/2020 and prior. FINDINGS: Emphysematous changes. Patchy right predominant basilar opacities. No pneumothorax or pleural effusion. Cardiomediastinal silhouette within normal limits. IMPRESSION: Right predominant basilar opacities, atelectasis versus infiltrate. Emphysema. Electronically Signed   By: Primitivo Gauze M.D.   On: 07/27/2020 15:25   VAS [place] LOWER EXTREMITY VENOUS (DVT)  Result Date: 07/30/2020  Lower Venous DVT Study Indications: Covid, d-dimer.  Anticoagulation: Lovenox. Limitations: Poor ultrasound/tissue interface due to superficial skin changes. Comparison Study: 03-20-2020 Prior LT lower extremity venous was negative for                   DVT. Performing Technologist: Darlin Coco RDMS  Examination Guidelines: A complete evaluation includes B-mode imaging, spectral Doppler, color Doppler, and power Doppler as needed of all accessible portions of each vessel. Bilateral testing is considered an integral part of a complete examination. Limited examinations for reoccurring indications may be performed as noted. The reflux portion of the exam is performed with the patient in reverse Trendelenburg.  +---------+---------------+---------+-----------+----------+-------------------+ RIGHT    CompressibilityPhasicitySpontaneityPropertiesThrombus Aging      +---------+---------------+---------+-----------+----------+-------------------+ CFV      Full           Yes      Yes                                      +---------+---------------+---------+-----------+----------+-------------------+ SFJ      Full                                                              +---------+---------------+---------+-----------+----------+-------------------+ FV Prox  Full                                                             +---------+---------------+---------+-----------+----------+-------------------+ FV Mid   Full                                                             +---------+---------------+---------+-----------+----------+-------------------+  FV DistalFull                                                             +---------+---------------+---------+-----------+----------+-------------------+ PFV      Full                                                             +---------+---------------+---------+-----------+----------+-------------------+ POP      Full           Yes      Yes                                      +---------+---------------+---------+-----------+----------+-------------------+ PTV      Full                                         Some segments not                                                         well visualized     +---------+---------------+---------+-----------+----------+-------------------+ PERO     Full                                         Some segments not                                                         well visualized     +---------+---------------+---------+-----------+----------+-------------------+   +---------+---------------+---------+-----------+----------+-------------------+ LEFT     CompressibilityPhasicitySpontaneityPropertiesThrombus Aging      +---------+---------------+---------+-----------+----------+-------------------+ CFV      Full           Yes      Yes                                      +---------+---------------+---------+-----------+----------+-------------------+ SFJ      Full                                                             +---------+---------------+---------+-----------+----------+-------------------+ FV  Prox  Full                                                             +---------+---------------+---------+-----------+----------+-------------------+  FV Mid   Full                                                             +---------+---------------+---------+-----------+----------+-------------------+ FV DistalFull                                                             +---------+---------------+---------+-----------+----------+-------------------+ PFV      Full                                                             +---------+---------------+---------+-----------+----------+-------------------+ POP      Full           Yes      Yes                                      +---------+---------------+---------+-----------+----------+-------------------+ PTV      Full                                         Some segments not                                                         well visualized     +---------+---------------+---------+-----------+----------+-------------------+ PERO     Full                                         Some segments not                                                         well visualized     +---------+---------------+---------+-----------+----------+-------------------+     Summary: RIGHT: - There is no evidence of deep vein thrombosis in the lower extremity. However, portions of this examination were limited- see technologist comments above.  - No cystic structure found in the popliteal fossa.  LEFT: - There is no evidence of deep vein thrombosis in the lower extremity. However, portions of this examination were limited- see technologist comments above.  - No cystic structure found in the popliteal fossa.  *See table(s) above for measurements and observations. Electronically signed by Ruta Hinds MD on 07/30/2020 at 4:19:16 PM.    Final    ECHOCARDIOGRAM LIMITED  Result Date: 07/28/2020    ECHOCARDIOGRAM  LIMITED REPORT  Patient Name:   KYANNAH CLIMER [facility] Date of Exam: 07/28/2020 Medical Rec #:  [id number]       Height:       62.0 in Accession #:    [id number]      Weight:       108.0 lb Date of Birth:  Dec 07, 1945       BSA:          1.471 m Patient Age:    13 years        BP:           114/78 mmHg Patient Gender: F               HR:           83 bpm. Exam Location:  Inpatient Procedure: Limited Echo, Limited Color Doppler and Cardiac Doppler Indications:    atrial fibrillation  History:        Patient has no prior history of Echocardiogram examinations.                 Covid and COPD; Risk Factors:Hypertension.  Sonographer:    Johny Chess Referring Phys: [place]  1. Left ventricular ejection fraction, by estimation, is 55 to 60%. The left ventricle has normal function. The left ventricle has no regional wall motion abnormalities. Left ventricular diastolic parameters were normal.  2. Right ventricular systolic function is normal. The right ventricular size is mildly enlarged.  3. The mitral valve is normal in structure. No evidence of mitral valve regurgitation.  4. The aortic valve is tricuspid. Aortic valve regurgitation is not visualized. No aortic stenosis is present.  5. The inferior vena cava is dilated in size with >50% respiratory variability, suggesting right atrial pressure of 8 mmHg. Comparison(s): No prior Echocardiogram. FINDINGS  Left Ventricle: Left ventricular ejection fraction, by estimation, is 55 to 60%. The left ventricle has normal function. The left ventricle has no regional wall motion abnormalities. The left ventricular internal cavity size was normal in size. There is  no left ventricular hypertrophy. Left ventricular diastolic parameters were normal. Right Ventricle: The right ventricular size is mildly enlarged. Right ventricular systolic function is normal. Mitral Valve: The mitral valve is normal in structure. Tricuspid Valve: The tricuspid valve is normal  in structure. Tricuspid valve regurgitation is not demonstrated. Aortic Valve: The aortic valve is tricuspid. Aortic valve regurgitation is not visualized. No aortic stenosis is present. Aorta: The aortic root is normal in size and structure and the ascending aorta was not well visualized. Venous: The inferior vena cava is dilated in size with greater than 50% respiratory variability, suggesting right atrial pressure of 8 mmHg. LEFT VENTRICLE PLAX 2D LVIDd:         4.40 cm  Diastology LVIDs:         2.50 cm  LV e' medial:    10.60 cm/s LV PW:         0.80 cm  LV E/e' medial:  7.9 LV IVS:        0.70 cm  LV e' lateral:   11.50 cm/s LVOT diam:     1.90 cm  LV E/e' lateral: 7.3 LV SV:         66 LV SV Index:   45 LVOT Area:     2.84 cm  IVC IVC diam: 2.40 cm LEFT ATRIUM         Index LA diam:    3.70 cm 2.51 cm/m  AORTIC VALVE LVOT  Vmax:   137.00 cm/s LVOT Vmean:  87.500 cm/s LVOT VTI:    0.233 m  AORTA Ao Root diam: 2.90 cm MITRAL VALVE MV Area (PHT): 4.10 cm    SHUNTS MV Decel Time: 185 msec    Systemic VTI:  0.23 m MV E velocity: 83.60 cm/s  Systemic Diam: 1.90 cm MV A velocity: 71.10 cm/s MV E/A ratio:  1.18 Rudean Haskell MD Electronically signed by Rudean Haskell MD Signature Date/Time: 07/28/2020/5:07:15 PM    Final

## 2020-08-04 NOTE — Progress Notes (Signed)
   08/04/20 0218  Assess: MEWS Score  Temp 98 F (36.7 C)  BP 113/71  Pulse Rate (!) 128 (on dinamap)  ECG Heart Rate (!) 135  Resp 19  Level of Consciousness Alert  SpO2 99 %  O2 Device Nasal Cannula  Patient Activity (if Appropriate) In bed  O2 Flow Rate (L/min) 6 L/min

## 2020-08-04 NOTE — Progress Notes (Signed)
Tele called to notify me that the pt converted from NSR to Afib and HR 120s-160s.

## 2020-08-04 NOTE — Progress Notes (Signed)
Pt BP at 0000 107/60 and HR of 80. Messaged MD to ask if the 60 mg 0000 cardizem dose should be given or held. MD directed me to give a half dose of 11m.

## 2020-08-04 NOTE — Progress Notes (Addendum)
Daily Progress Note   Patient Name: [name]       Date: 08/04/2020 DOB: 21-Apr-1946  Age: 75 y.o. MRN#: [id number] Attending Physician: Thurnell Lose, MD Primary Care Physician: Crystal Sacramento, MD Admit Date: 07/27/2020  Reason for Consultation/Follow-up: Disposition and Establishing goals of care  Subjective: Chart review performed. Received report from primary RN - no acute concerns.  Went to visit patient at bedside - no family/visitors present. Patient was lying in bed awake, alert, oriented, and able to participate in conversation. Patient states that she is feeling better today than yesterday - she does not remember our [place] yesterday due to her feeling ill. Patient states her pain has improved with norco. Patient still endorses shortness of breath but states it has improved - she is on 4L O2 [place], which is her baseline.   We discussed in detail again today home hospice vs rehab. I explained that going home with hospice would mean she would require 24hr supervision - either someone staying with her in her home vs going to live with someone. Patient is unsure if this is feasible at this time. She seems most interested today in discharging to rehab to attempt to regain some of her strength, with the goal to leave rehab with hospice care. We discussed that rehab may or may not provide benefit to her overall condition. Explained with rehab she is at high risk for rehospitalization - recommended outpatient Palliative Care to follow with her with this option. Discussed nutrition in context of finding benefit at rehab - patient expressed understanding. Patient seems overwhelmed with information and she expresses concern she is going to make the "wrong decision" - explained there are no right or  wrong decisions as long as she does what she feels is best, we would try and find ways to support her. Prognostication was again reviewed per her request. Briefly discussed private duty caregivers as an option to supplement home hospice services. Patient is not ready to make a decision today - she would like to discuss further with her friend [name].   Called and spoke with [name] - provided updates on conversation had with [name] as outlined above. [name] states that she recommended to [name] she go to rehab at discharge, explaining this would allow her time to try and get  things in place to care for [name] with hospice at home when she was done. I reviewed information on rehab benefit/non-benefit and outpatient PC as outlined above.[name] stated she and the patient would continue discussion this afternoon.  All questions and concerns addressed. Encouraged to call with questions and/or concerns. PMT number previously provided.   Length of Stay: 8  Current Medications: Scheduled Meds:  . aspirin  81 mg Oral Daily  . busPIRone  5 mg Oral BID  . diltiazem  60 mg Oral Q6H  . enoxaparin (LOVENOX) injection  40 mg Subcutaneous Q24H  . feeding supplement  237 mL Oral TID BM  . folic acid  1 mg Oral Daily  . isosorbide mononitrate  30 mg Oral Daily  . levalbuterol  2 puff Inhalation Q6H  . mouth rinse  15 mL Mouth Rinse BID  . methylPREDNISolone (SOLU-MEDROL) injection  40 mg Intravenous BID  . mometasone-formoterol  2 puff Inhalation BID  . multivitamin with minerals  1 tablet Oral Daily  . pantoprazole  40 mg Oral Q1200  . thiamine  100 mg Oral Daily   Or  . thiamine  100 mg Intravenous Daily  . umeclidinium bromide  1 puff Inhalation Daily    Continuous Infusions:   PRN Meds: acetaminophen, albuterol, benzonatate, guaiFENesin-dextromethorphan, HYDROcodone-acetaminophen, lip balm, morphine injection, ondansetron (ZOFRAN) IV  Physical Exam Vitals and nursing note reviewed.   Constitutional:      General: She is not in acute distress.    Appearance: She is ill-appearing.  Pulmonary:     Effort: No respiratory distress.  Skin:    General: Skin is warm and dry.     Findings: Ecchymosis present.  Neurological:     Mental Status: She is alert and oriented to person, place, and time.     Motor: Weakness present.  Psychiatric:        Attention and Perception: Attention normal.        Behavior: Behavior is cooperative.        Cognition and Memory: Cognition and memory normal.             Vital Signs: BP 127/74 (BP Location: Right Arm)   Pulse 88   Temp 97.8 F (36.6 C) (Oral)   Resp 19   Ht _0  (1.575 m)   Wt 50.2 kg   SpO2 100%   BMI 20.24 kg/m  SpO2: SpO2: 100 % O2 Device: O2 Device: Nasal Cannula O2 Flow Rate: O2 Flow Rate (L/min): 6 L/min  Intake/output summary:   Intake/Output Summary (Last 24 hours) at 08/04/2020 1037 Last data filed at 08/03/2020 1730 Gross per 24 hour  Intake 240 ml  Output 200 ml  Net 40 ml   LBM: Last BM Date: 08/03/20 Baseline Weight: Weight: 49 kg Most recent weight: Weight: 50.2 kg       Palliative Assessment/Data: PPS 40%      Patient Active Problem List   Diagnosis Date Noted  . New onset a-fib ([place]) 07/27/2020  . COVID-19 virus infection 07/27/2020  . Chest pain, musculoskeletal 02/14/2020  . Need for vaccination 10/05/2019  . Rheumatoid factor positive 09/19/2019  . 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 Protein-calorie malnutrition, severe 08/20/2019  . Severe sepsis ([place]) 08/19/2019  . Acute lower UTI 08/19/2019  . Pressure injury of skin 08/19/2019  . ARF (acute renal failure) ([place]) 08/18/2019  . Hyperkalemia 08/18/2019  . Transaminitis 08/18/2019  . Generalized weakness 08/18/2019  . Leukocytosis 08/18/2019  . Anemia 08/18/2019  . Acute  renal failure (ARF) ([place]) 08/18/2019  . Chronic respiratory failure with hypoxia and hypercapnia ([place]) 06/02/2018  . COPD with acute exacerbation  ([place]) 05/09/2018  . Hyponatremia 05/09/2018  . Acute urinary retention 05/09/2018  . Cystocele with prolapse 03/02/2018  . Cor pulmonale, chronic ([place]) 06/29/2017  . Chronic respiratory failure with hypoxia ([place]) 06/30/2014  . Pulmonary infiltrates 01/26/2013  . Multiple pulmonary nodules determined by computed tomography of lung 08/19/2012  . Cancer screening 05/12/2012  . COPD exacerbation ([place]) 05/12/2012  . Essential hypertension 03/28/2012  . COPD GOLD III 12/02/2011  . Chronic cough 12/02/2011  . Cigarette smoker 12/02/2011    Palliative Care Assessment & Plan   Patient Profile: 75 y.o.femalewith past medical history of advanced COPD on 3L oxygen at home, atrial fibrillation, failure to thrive, severe protein calorie malnutrition, pulmonary hypertension, and CAD. She was brought in the emergency department on2/11/2022with shortness of breath and weakness.Associated symptoms included palpitations and worsening productive cough.  ED Course: Found to be in A-fib with RVR. COVID-19 positive. Chest x-ray showing right predominant basilar opacities with atelectasis versus infiltrates. CT abdomen pelvis showed diffuse and severely fatty infiltration of the liver. Patient is tremulous and has a history of alcohol abuse. Patient was admitted to [facility] for further management of a-fib RVR, acute on chronic hypoxic respiratory failure, and alcohol withdraw.  Assessment: COPD exacerbation COVID19 pneumonia Atrial fibrillation with RVR Acute alcoholic hepatitis Deconditioning Fragility  Recommendations/Plan:  Continue current medical treatment  Continue DNR/DNI as previously documented  Patient requests her friend/Sonja Setzer as healthcare proxy; HCPOA cannot be completed until COVID isolation precautions are removed  Patient still considering disposition options - home with hospice vs rehab  PMT will continue to follow and support holistically   Goals of Care and Additional  Recommendations:  Limitations on Scope of Treatment: Full Scope Treatment  Code Status:    Code Status Orders  (From admission, onward)         Start     Ordered   08/02/20 1924  Do not attempt resuscitation (DNR)  Continuous       Question Answer Comment  In the event of cardiac or respiratory ARREST Do not call a "code blue"   In the event of cardiac or respiratory ARREST Do not perform Intubation, CPR, defibrillation or ACLS   In the event of cardiac or respiratory ARREST Use medication by any route, position, wound care, and other measures to relive pain and suffering. May use oxygen, suction and manual treatment of airway obstruction as needed for comfort.      08/02/20 1923        Code Status History    Date Active Date Inactive Code Status Order ID Comments User Context   07/27/2020 2310 08/02/2020 1923 Full Code [id number]  Elwyn Reach, MD ED   08/18/2019 2322 08/24/2019 2039 Full Code [id number]  Rhetta Mura, DO ED   05/09/2018 0211 05/20/2018 1706 Full Code [id number]  Vianne Bulls, MD ED   03/02/2018 1028 03/03/2018 1351 Full Code [id number]  Cheri Fowler, MD Inpatient   08/19/2012 2119 08/21/2012 1728 Full Code [id number]  Samuella Cota, MD Inpatient   Advance Care Planning Activity       Prognosis:   < 6 months  Discharge Planning:  To Be Determined   Care plan was discussed with primary RN, patient, patient's friend, Dr. Candiss Norse, [facility]  Thank you for allowing the Palliative Medicine Team to assist in the care of this patient.  Total Time 45 minutes Prolonged Time Billed  no       Greater than 50%  of this time was spent counseling and coordinating care related to the above assessment and plan.  Lin Landsman, NP  Please contact Palliative Medicine Team phone at [phone] for questions and concerns.

## 2020-08-04 NOTE — Progress Notes (Signed)
Spoke to the patient's friend, Burt Knack.  He said he can coordinate bringing her clothes to rehab, etc. or preparing her home for return depending on what her discharge plan includes but needs a day notice to do so.

## 2020-08-05 DIAGNOSIS — J9611 Chronic respiratory failure with hypoxia: Secondary | ICD-10-CM | POA: Diagnosis not present

## 2020-08-05 DIAGNOSIS — J441 Chronic obstructive pulmonary disease with (acute) exacerbation: Secondary | ICD-10-CM | POA: Diagnosis not present

## 2020-08-05 DIAGNOSIS — I4891 Unspecified atrial fibrillation: Secondary | ICD-10-CM | POA: Diagnosis not present

## 2020-08-05 DIAGNOSIS — R54 Age-related physical debility: Secondary | ICD-10-CM

## 2020-08-05 DIAGNOSIS — R5381 Other malaise: Secondary | ICD-10-CM

## 2020-08-05 DIAGNOSIS — U071 COVID-19: Secondary | ICD-10-CM | POA: Diagnosis not present

## 2020-08-05 MED ORDER — LEVALBUTEROL TARTRATE 45 MCG/ACT IN AERO
2.0000 | INHALATION_SPRAY | Freq: Two times a day (BID) | RESPIRATORY_TRACT | Status: DC
  Administered 2020-08-05 – 2020-08-07 (×5): 2 via RESPIRATORY_TRACT

## 2020-08-05 NOTE — Progress Notes (Signed)
Hydrologist [facility])  Hospital Liaison: RN note       Notified by [facility] manager of patient/family request for [facility] Palliative services at [facility] after discharge.       [place] Palliative team will follow up with patient after discharge.       Please call with any hospice or palliative related questions.       Thank you for this referral.       Clementeen Hoof, BSN, RN [facility] Liaison (listed on [place] under [facility] [place] [facility] [place])   [phone]

## 2020-08-05 NOTE — TOC Progression Note (Addendum)
Transition of Care [facility] - Progression Note    Patient Details  Name: [name] MRN: [id number] Date of Birth: [birth year]  Transition of Care [facility]) CM/SW Contact  Joanne Chars, LCSW Phone Number: 08/05/2020, 2:05 PM  Clinical Narrative:  CSW spoke with pt by phone to discuss discharge plan.  Pt states that she needs rehab prior to going home.  Discussed bed offers at [facility] and [facility].  Pt does not want to be outside of [place], willing to accept bed offer to [place].  CSW attempted to call pt friend Becky Sax to confirm but was not able to reach her.    1415: Sonja called back, confirmed that she and pt had discussed this yesterday and agreed that pt will go to SNF, also in agreement with plan for [facility].     Expected Discharge Plan: [place]    Expected Discharge Plan and Services Expected Discharge Plan: [place]                                               Social Determinants of Health (SDOH) Interventions    Readmission Risk Interventions No flowsheet data found.

## 2020-08-05 NOTE — Plan of Care (Signed)
Pt resting quietly. Pain medication given. Will continue to monitor. O2 5L [place] in place. Call bell in reach.   Problem: Clinical Measurements: Goal: Will remain free from infection Outcome: Progressing Goal: Respiratory complications will improve Outcome: Progressing   Problem: Activity: Goal: Risk for activity intolerance will decrease Outcome: Progressing   Problem: Nutrition: Goal: Adequate nutrition will be maintained Outcome: Progressing   Problem: Coping: Goal: Level of anxiety will decrease Outcome: Progressing   Problem: Pain Managment: Goal: General experience of comfort will improve Outcome: Progressing   Problem: Safety: Goal: Ability to remain free from injury will improve Outcome: Progressing   Problem: Skin Integrity: Goal: Risk for impaired skin integrity will decrease Outcome: Progressing

## 2020-08-05 NOTE — Plan of Care (Signed)
  Problem: Education: Goal: Knowledge of General Education information will improve Description: Including pain rating scale, medication(s)/side effects and non-pharmacologic comfort measures Outcome: Progressing   Problem: Health Behavior/Discharge Planning: Goal: Ability to manage health-related needs will improve Outcome: Progressing   Problem: Activity: Goal: Risk for activity intolerance will decrease Outcome: Progressing   Problem: Nutrition: Goal: Adequate nutrition will be maintained Outcome: Progressing   Problem: Coping: Goal: Level of anxiety will decrease Outcome: Progressing   No acute events took place overnight, vitals remained stable. Will continue to monitor.

## 2020-08-05 NOTE — TOC Progression Note (Addendum)
Transition of Care [facility]) - Progression Note    Patient Details  Name: THEO REITHER MRN: [id number] Date of Birth: [birth year]  Transition of [facility]) CM/SW [place], [place] Phone Number: 08/05/2020, 4:41 PM  Clinical Narrative:     2/20-CSW received call from [facility] with [facility] and made referral for patient for outpatient palliative to follow patient at SNF.  TOC team will continue to follow.   CSW received consult for outpatient palliative referral. CSW attempted to call [facility] to make referral for outpatient palliative to follow patient at SNF. CSW left voicemail with [facility]. CSW awaiting callback.  TOC team will continue to follow.    Expected Discharge Plan: [place]    Expected Discharge Plan and Services Expected Discharge Plan: [place]                                               Social Determinants of Health (SDOH) Interventions    Readmission Risk Interventions No flowsheet data found.

## 2020-08-05 NOTE — Progress Notes (Signed)
Daily Progress Note   Patient Name: [name]       Date: 08/05/2020 DOB: May 16, 1946  Age: 75 y.o. MRN#: [id number] Attending Physician: Thurnell Lose, MD Primary Care Physician: Christain Sacramento, MD Admit Date: 07/27/2020  Reason for Consultation/Follow-up: Disposition and Establishing goals of care  Subjective: Chart review performed. Received report from primary RN - no acute concerns.   Went to visit patient at bedside - no family/visitors present. Patient was sitting up in chair, awake, alert, oriented, and able to participate in conversation. She appears very weak and frail and is on 4L O2 [place], which is her baseline.   Continued disposition discussion - reviewed per LCSW notes it had seemed she had decided for discharge to rehab facility. Patient states "I haven't officially decided yet." I attempted to explore her thoughts and hesitations to make a decision. Patient states, "I have never been through this before" and "it's a hard decision" - validation provided that these decisions can be difficult. I again tried to assure her that no decision is a wrong decision. Patient states she does not have any further questions about disposition options. Outpatient Palliative Care was reviewed and offered - patient was agreeable.  I introduced, provided education on, and attempted to complete MOST form with patient today. Patient states, "why are you all so pushy." I apologized if that was how I came off - she said "you have that form in my face." I again explained intentions to make her wishes known on paper but respected if she was not interested in completing form today. Patient states, "I am overwhelmed" and "I don't know what to do." Offered to review form at another time. Patient apologized  for her reaction.   During visit patient's room phone rang - I answered it per her request and it was her daughter - patient did not want to speak with her. Patient states, "she didn't talk to me before the hospital, I don't want to talk to her now." Patient does not wish for daughter to be involved in her care.   All questions and concerns addressed. Encouraged to call with questions and/or concerns. PMT card previously provided.  Called patient's friend/Sonja to provide updates. She fells rehab is the best option at this time for the patient because no one can stay  with her at home. Gently reviewed again that the patient will likely continue to decline and it was important to prepare for that time - Sonja expressed understanding. She is appreciative of PMT assistance and guidance.   Length of Stay: 9  Current Medications: Scheduled Meds:  . aspirin  81 mg Oral Daily  . busPIRone  5 mg Oral BID  . diltiazem  60 mg Oral Q6H  . enoxaparin (LOVENOX) injection  40 mg Subcutaneous Q24H  . feeding supplement  237 mL Oral TID BM  . folic acid  1 mg Oral Daily  . isosorbide mononitrate  30 mg Oral Daily  . levalbuterol  2 puff Inhalation BID  . mouth rinse  15 mL Mouth Rinse BID  . methylPREDNISolone (SOLU-MEDROL) injection  40 mg Intravenous BID  . mometasone-formoterol  2 puff Inhalation BID  . multivitamin with minerals  1 tablet Oral Daily  . pantoprazole  40 mg Oral Q1200  . thiamine  100 mg Oral Daily   Or  . thiamine  100 mg Intravenous Daily  . umeclidinium bromide  1 puff Inhalation Daily    Continuous Infusions:   PRN Meds: acetaminophen, albuterol, benzonatate, guaiFENesin-dextromethorphan, HYDROcodone-acetaminophen, lip balm, morphine injection, ondansetron (ZOFRAN) IV  Physical Exam Vitals and nursing note reviewed.  Constitutional:      General: She is not in acute distress.    Appearance: She is ill-appearing.  Pulmonary:     Effort: No respiratory distress.   Skin:    General: Skin is warm and dry.  Neurological:     Mental Status: She is alert and oriented to person, place, and time.     Motor: Weakness present.  Psychiatric:        Attention and Perception: Attention normal.        Mood and Affect: Mood is anxious.        Behavior: Behavior is cooperative.        Cognition and Memory: Cognition and memory normal.             Vital Signs: BP 123/74 (BP Location: Right Arm)   Pulse 85   Temp 98.4 F (36.9 C) (Oral)   Resp 19   Ht 5' 2" (1.575 m)   Wt 50.2 kg   SpO2 99%   BMI 20.24 kg/m  SpO2: SpO2: 99 % O2 Device: O2 Device: Nasal Cannula O2 Flow Rate: O2 Flow Rate (L/min): 5 L/min  Intake/output summary:   Intake/Output Summary (Last 24 hours) at 08/05/2020 1534 Last data filed at 08/05/2020 0755 Gross per 24 hour  Intake 120 ml  Output 800 ml  Net -680 ml   LBM: Last BM Date: 08/03/20 Baseline Weight: Weight: 49 kg Most recent weight: Weight: 50.2 kg       Palliative Assessment/Data: 40%      Patient Active Problem List   Diagnosis Date Noted  . New onset a-fib ([place]) 07/27/2020  . COVID-19 virus infection 07/27/2020  . Chest pain, musculoskeletal 02/14/2020  . Need for vaccination 10/05/2019  . Rheumatoid factor positive 09/19/2019  . 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 Protein-calorie malnutrition, severe 08/20/2019  . Severe sepsis ([place]) 08/19/2019  . Acute lower UTI 08/19/2019  . Pressure injury of skin 08/19/2019  . ARF (acute renal failure) ([place]) 08/18/2019  . Hyperkalemia 08/18/2019  . Transaminitis 08/18/2019  . Generalized weakness 08/18/2019  . Leukocytosis 08/18/2019  . Anemia 08/18/2019  . Acute renal failure (ARF) ([place]) 08/18/2019  . Chronic respiratory failure with hypoxia and hypercapnia (  [place]) 06/02/2018  . COPD with acute exacerbation ([place]) 05/09/2018  . Hyponatremia 05/09/2018  . Acute urinary retention 05/09/2018  . Cystocele with prolapse 03/02/2018  . Cor pulmonale, chronic  ([place]) 06/29/2017  . Chronic respiratory failure with hypoxia ([place]) 06/30/2014  . Pulmonary infiltrates 01/26/2013  . Multiple pulmonary nodules determined by computed tomography of lung 08/19/2012  . Cancer screening 05/12/2012  . COPD exacerbation ([place]) 05/12/2012  . Essential hypertension 03/28/2012  . COPD GOLD III 12/02/2011  . Chronic cough 12/02/2011  . Cigarette smoker 12/02/2011    Palliative Care Assessment & Plan   Patient Profile: 75 y.o.femalewith past medical history of advanced COPD on 3L oxygen at home, atrial fibrillation, failure to thrive, severe protein calorie malnutrition, pulmonary hypertension, and CAD. She was brought in the emergency department on2/11/2022with shortness of breath and weakness.Associated symptoms included palpitations and worsening productive cough.  ED Course: Found to be in A-fib with RVR. COVID-19 positive. Chest x-ray showing right predominant basilar opacities with atelectasis versus infiltrates. CT abdomen pelvis showed diffuse and severely fatty infiltration of the liver. Patient is tremulous and has a history of alcohol abuse. Patient was admitted to [facility] for further management of a-fib RVR, acute on chronic hypoxic respiratory failure, and alcohol withdraw.   Assessment: COPD exacerbation COVID19 pneumonia Atrial fibrillation with RVR Acute alcoholic hepatitis Deconditioning Fragility  Recommendations/Plan:  Continue current medical treatment  Continue DNR/DNI as previously documented  Patient would be home hospice appropriate; however, PT states she needs 24hr supervision/assistance, which she does not have at home and no options to live with anyone   Patient is overwhelmed and struggling with decision making - at this time she has agreed for discharge to SNF rehab with outpatient PC to follow  [facility] consult placed for: outpatient Palliative Care referral  Complete MOST form before discharge if she becomes  agreeable  Patient requests her friend/Sonja Setzer as healthcare proxy; HCPOA cannot be completed until COVID isolation precautions are removed  Do not involve daughter in patient's care per her request  PMT will continue to follow peripherally. If there are any imminent needs please call the service directly   Goals of Care and Additional Recommendations:  Limitations on Scope of Treatment: Full Scope Treatment  Code Status:    Code Status Orders  (From admission, onward)         Start     Ordered   08/02/20 1924  Do not attempt resuscitation (DNR)  Continuous       Question Answer Comment  In the event of cardiac or respiratory ARREST Do not call a "code blue"   In the event of cardiac or respiratory ARREST Do not perform Intubation, CPR, defibrillation or ACLS   In the event of cardiac or respiratory ARREST Use medication by any route, position, wound care, and other measures to relive pain and suffering. May use oxygen, suction and manual treatment of airway obstruction as needed for comfort.      08/02/20 1923        Code Status History    Date Active Date Inactive Code Status Order ID Comments User Context   07/27/2020 2310 08/02/2020 1923 Full Code [id number]  Elwyn Reach, MD ED   08/18/2019 2322 08/24/2019 2039 Full Code [id number]  Rhetta Mura, DO ED   05/09/2018 0211 05/20/2018 1706 Full Code [id number]  Vianne Bulls, MD ED   03/02/2018 1028 03/03/2018 1351 Full Code [id number]  Cheri Fowler, MD Inpatient   08/19/2012 2119 08/21/2012  1728 Full Code [id number]  Samuella Cota, MD Inpatient   Advance Care Planning Activity       Prognosis:   < 6 months  Discharge Planning:  [place] for rehab with Palliative care service follow-up  Care plan was discussed with primary RN, Dr. Candiss Norse, [facility], patient, patient's friend  Thank you for allowing the Palliative Medicine Team to assist in the care of this patient.   Total Time 40 minutes  Prolonged Time Billed  no       Greater than 50%  of this time was spent counseling and coordinating care related to the above assessment and plan.  Lin Landsman, NP  Please contact Palliative Medicine Team phone at [phone] for questions and concerns.

## 2020-08-05 NOTE — Progress Notes (Signed)
PROGRESS NOTE                                                                                                                                                                                                             Patient Demographics:    [name], is a 75 y.o. female, DOB - 1946-04-10, HTM:[id number]  Outpatient Primary MD for the patient is Christain Sacramento, MD   Admit date - 07/27/2020   LOS - 9  Chief Complaint  Patient presents with  . Shortness of Breath  . Chest Pain    Started two weeks ago with progression to severe difficulty breathing and "bad cp"       Brief Narrative: Patient is a 75 y.o. female with PMHx of EtOH use, anemia of chronic disease, eczematous dermatitis, COPD on 3 L of oxygen at home, pulmonary hypertension-who presented with worsening cough-shortness of breath-found to have A. fib with RVR.  Started on Cardizem and heparin infusion-subsequently admitted to the hospitalist service.  COVID-19 vaccinated status: Vaccinated but not boosted  Significant Events: 2/12>> Admit to [facility] for COPD exacerbation-A. fib RVR-COVID-19 positive  Significant studies: 2/11>>Chest x-ray: Right> left basilar opacities 2/11>> CT abdomen/pelvis: No acute abdominal/pelvic findings, diffuse/severe fatty infiltration of liver 2/12>> Leg [place] - No DVT 2/13 TTE - IMPRESSIONS  1. Left ventricular ejection fraction, by estimation, is 55 to 60%. The left ventricle has normal function. The left ventricle has no regional wall motion abnormalities. Left ventricular diastolic parameters were normal.  2. Right ventricular systolic function is normal. The right ventricular size is mildly enlarged.  3. The mitral valve is normal in structure. No evidence of mitral valve regurgitation.  4. The aortic valve is tricuspid. Aortic valve regurgitation is not visualized. No aortic stenosis is present.  5. The inferior vena cava is  dilated in size with >50% respiratory variability, suggesting right atrial pressure of 8 mmHg  COVID-19 medications: Steroids: 2/12>>  Antibiotics: Doxycycline: 2/12>>  Microbiology data: None  Procedures: None  Consults: Cardiology, Pall Care  DVT prophylaxis: enoxaparin (LOVENOX) injection 40 mg Start: 07/29/20 1000 SCDs Start: 07/27/20 2311     Subjective:   Patient in bed, appears comfortable, denies any headache, no fever, no chest pain or pressure, improved shortness of breath , no abdominal pain. No focal weakness.   Assessment  & Plan :  COPD exacerbation: Probably triggered by COVID-19 infection-now on IV Solu-Medrol and doxycycline, scheduled Xopenex-add Incruse inhaler.  She is stable-moving air-appears comfortable.  Back on her usual 3-4 L of oxygen (home regimen), single dose Lasix on 08/02/2020 as she has some rales on exam, note she has mild chronic shortness of breath due to advanced underlying COPD, already appears extremely frail and cachectic, long-term prognosis does not look good, palliative care involved patient now deciding between residential and home hospice versus home with palliative care.  She will make her decision on Monday, I have clearly explained to her multiple times that her prognosis is extremely poor.  ?COVID-19 PNA: Possible pneumonic changes in x-ray chest-CRP elevated-however LFTs are significantly elevated.  Will treat with steroids-avoid Remdesivir due to significantly elevated LFTs.  Hypoxemia is at baseline-on usual 3 L of oxygen-no role for immunomodulators at this time.  Recent Labs  Lab 07/30/20 0146 07/31/20 0430 08/01/20 0204 08/02/20 0045 08/03/20 0119  WBC 9.5 11.7* 9.2 11.0* 10.6*  HGB 7.6* 8.3* 8.2* 8.5* 8.3*  HCT 22.9* 26.5* 26.1* 26.6* 25.9*  PLT 117* 121* 111* 124* 136*  CRP 2.1* 1.0* 0.7 0.6 0.6  BNP 425.0* 561.1* 468.9* 236.8* 303.5*  DDIMER 2.99* 3.14* 3.04*  --   --   AST 184* 70* 51* 50* 46*  ALT 176* 135*  105* 104* 91*  ALKPHOS 118 121 101 99 86  BILITOT 0.9 0.8 0.7 0.6 0.3  ALBUMIN 2.5* 2.6* 2.4* 2.6* 2.6*  INR 1.0 0.9 0.9 1.0  --       PAF with RVR: Back in sinus rhythm-stop Cardizem infusion-starting short acting Cardizem-on IV heparin currently.  She is very frail-lives alone-alcohol use-has chronic normocytic anemia-I agree with cardiology that she is a poor long-term candidate for anticoagulation.  Probably better to stick with aspirin.  Echo noted, on aspirin only now.  Elevated D-dimer. -ve leg ultrasound, on prophylactic Lovenox, monitor, poor candidate for anticoagulation.    Acute alcoholic hepatitis: LFTs significantly elevated-but seems to be slowly downtrending-discriminant function only 5.  No role for steroids-CT abdomen with significant fatty infiltration.  Supportive care-avoid hepatotoxic medications.  Follow LFTs  EtOH withdrawal: Tremulous-but awake/alert.  Claims that she drinks at least one 12 ounce beer-along with 3-4 shots of liquor on a daily basis.  Last drink was 2/11.  No signs of DTs.  Normocytic anemia: Anemia panel suggestive of anemia of chronic disease, supportive care.  History of pulmonary hypertension: Due to COPD-outpatient follow-up with pulmonary.  Continue oxygen supplementation as needed.  COPD appears extremely advanced and terminal.  Deconditioning/debility/frailty: SNF once stable.  Depression: Hold bupropion-allow LFTs to improve further before initiating.  Eczematous dermatitis-keratotic lesions in left breast/bilateral lower extremities - outpatient dermatology follow-up.   RN pressure injury documentation: Pressure Injury 08/19/19 Sacrum Mid Stage 2 -  Partial thickness loss of dermis presenting as a shallow open injury with a red, pink wound bed without slough. (Active)  08/19/19 0200  Location: Sacrum  Location Orientation: Mid  Staging: Stage 2 -  Partial thickness loss of dermis presenting as a shallow open injury with a red, pink  wound bed without slough.  Wound Description (Comments):   Present on Admission: Yes    GI prophylaxis: PPI   Condition - Stable  Family Communication  :  Daughter-Donna-[phone] previous MD updated over the phone on 2/12- agreed with avoiding anticoagulation given alcohol use/safety-understands embolic/CVA risk, updated 07/29/2020 essential left at 11:37 AM, 08/01/20 message left at 11:13 AM.  Patient now wishes  her daughter not to be involved in patient's care.  Code Status :  Full Code  Diet :  Diet Order            Diet regular Room service appropriate? Yes; Fluid consistency: Thin  Diet effective now                  Disposition Plan  :  Status is: Inpatient  Remains inpatient appropriate because:Inpatient level of care appropriate due to severity of illness  Dispo: The patient is from: Home              Anticipated d/c is to: SNF              Anticipated d/c date is: > 3 days              Patient currently is not medically stable to d/c.   Difficult to place patient No  Barriers to discharge: COPD exacerbation on IV steroids, COVID-19 infection, alcoholic hepatitis-needs continued inpatient monitoring/treatment.  Antimicorbials  :    Anti-infectives (From admission, onward)   Start     Dose/Rate Route Frequency Ordered Stop   07/28/20 1000  doxycycline (VIBRA-TABS) tablet 100 mg        100 mg Oral Every 12 hours 07/28/20 0813 08/01/20 1741      Inpatient Medications  Scheduled Meds: . aspirin  81 mg Oral Daily  . busPIRone  5 mg Oral BID  . diltiazem  60 mg Oral Q6H  . enoxaparin (LOVENOX) injection  40 mg Subcutaneous Q24H  . feeding supplement  237 mL Oral TID BM  . folic acid  1 mg Oral Daily  . isosorbide mononitrate  30 mg Oral Daily  . levalbuterol  2 puff Inhalation BID  . mouth rinse  15 mL Mouth Rinse BID  . methylPREDNISolone (SOLU-MEDROL) injection  40 mg Intravenous BID  . mometasone-formoterol  2 puff Inhalation BID  . multivitamin with  minerals  1 tablet Oral Daily  . pantoprazole  40 mg Oral Q1200  . thiamine  100 mg Oral Daily   Or  . thiamine  100 mg Intravenous Daily  . umeclidinium bromide  1 puff Inhalation Daily   Continuous Infusions:  PRN Meds:.acetaminophen, albuterol, benzonatate, guaiFENesin-dextromethorphan, HYDROcodone-acetaminophen, lip balm, morphine injection, ondansetron (ZOFRAN) IV   Time Spent in minutes  25  See all Orders from today for further details   Lala Lund M.D on 08/05/2020 at 10:34 AM  To page go to [email or web address] - use universal password  [facility] -  Office  [phone]    Objective:   Vitals:   08/04/20 2033 08/05/20 0040 08/05/20 0444 08/05/20 0747  BP: 123/64 130/72 125/69 128/69  Pulse: 82  70 64  Resp: _0 Temp: 98 F (36.7 C)  98.1 F (36.7 C) 98 F (36.7 C)  TempSrc: Oral  Oral Oral  SpO2: 100%  100% 100%  Weight:      Height:        Wt Readings from Last 3 Encounters:  07/30/20 50.2 kg  06/22/20 51.5 kg  02/13/20 50.2 kg     Intake/Output Summary (Last 24 hours) at 08/05/2020 1034 Last data filed at 08/05/2020 0755 Gross per 24 hour  Intake 120 ml  Output 800 ml  Net -680 ml     Physical Exam  Extremely frail, cachectic elderly white female, in no acute distress, no focal deficits [place].AT,PERRAL Supple Neck,No JVD, No cervical lymphadenopathy appriciated.  Symmetrical  Chest wall movement, Good air movement bilaterally, CTAB RRR,No Gallops, Rubs or new Murmurs, No Parasternal Heave +ve B.Sounds, Abd Soft, No tenderness, No organomegaly appriciated, No rebound - guarding or rigidity. Chronic diffuse bruising from previous lab draws/fals, frail capillaries, chronic scaly rash both legs & L.chest wall   Data Review:    CBC  Recent Labs  Lab 07/30/20 0146 07/31/20 0430 08/01/20 0204 08/02/20 0045 08/03/20 0119  WBC 9.5 11.7* 9.2 11.0* 10.6*  HGB 7.6* 8.3* 8.2* 8.5* 8.3*  HCT 22.9* 26.5* 26.1* 26.6* 25.9*  PLT 117*  121* 111* 124* 136*  MCV 97.0 99.6 99.6 98.5 99.2  MCH 32.2 31.2 31.3 31.5 31.8  MCHC 33.2 31.3 31.4 32.0 32.0  RDW 18.1* 18.3* 18.5* 18.7* 19.3*    Chemistries  Recent Labs  Lab 07/30/20 0146 07/31/20 0430 08/01/20 0204 08/02/20 0045 08/03/20 0119  NA 138 138 137 137 138  K 3.8 3.8 3.9 3.7 3.9  CL 91* 93* 93* 89* 88*  CO2 38* 38* 37* 39* 38*  GLUCOSE 225* 195* 200* 224* 229*  BUN _0 27* 30*  CREATININE 0.51 0.47 0.47 0.90 0.72  CALCIUM 8.5* 8.5* 8.5* 8.6* 8.9  MG 2.1 1.9 1.9 2.1 2.0  AST 184* 70* 51* 50* 46*  ALT 176* 135* 105* 104* 91*  ALKPHOS 118 121 101 99 86  BILITOT 0.9 0.8 0.7 0.6 0.3   ------------------------------------------------------------------------------------------------------------------ No results for input(s): CHOL, HDL, LDLCALC, TRIG, CHOLHDL, LDLDIRECT in the last 72 hours.  Lab Results  Component Value Date   HGBA1C 6.1 (H) 05/13/2018   ------------------------------------------------------------------------------------------------------------------ No results for input(s): TSH, T4TOTAL, T3FREE, THYROIDAB in the last 72 hours.  Invalid input(s): FREET3 ------------------------------------------------------------------------------------------------------------------ No results for input(s): VITAMINB12, FOLATE, FERRITIN, TIBC, IRON, RETICCTPCT in the last 72 hours.  Coagulation profile Recent Labs  Lab 07/30/20 0146 07/31/20 0430 08/01/20 0204 08/02/20 0045  INR 1.0 0.9 0.9 1.0    No results for input(s): DDIMER in the last 72 hours.  Cardiac Enzymes No results for input(s): CKMB, TROPONINI, MYOGLOBIN in the last 168 hours.  Invalid input(s): CK ------------------------------------------------------------------------------------------------------------------    Component Value Date/Time   BNP 303.5 (H) 08/03/2020 0119    Micro Results Recent Results (from the past 240 hour(s))  Resp Panel by RT-PCR (Flu A&B, Covid)  Nasopharyngeal Swab     Status: Abnormal   Collection Time: 07/27/20  3:28 PM   Specimen: Nasopharyngeal Swab; Nasopharyngeal(NP) swabs in vial transport medium  Result Value Ref Range Status   SARS Coronavirus 2 by RT PCR POSITIVE (A) NEGATIVE Final    Comment: RESULT CALLED TO, READ BACK BY AND VERIFIED WITH: Wyvonna Plum RN [id number] 07/27/20 A BROWNING (NOTE) SARS-CoV-2 target nucleic acids are DETECTED.  The SARS-CoV-2 RNA is generally detectable in upper respiratory specimens during the acute phase of infection. Positive results are indicative of the presence of the identified virus, but do not rule out bacterial infection or co-infection with other pathogens not detected by the test. Clinical correlation with patient history and other diagnostic information is necessary to determine patient infection status. The expected result is Negative.  Fact Sheet for Patients: EntrepreneurPulse.com.au  Fact Sheet for Healthcare Providers: IncredibleEmployment.be  This test is not yet approved or cleared by the [place] FDA and  has been authorized for detection and/or diagnosis of SARS-CoV-2 by FDA under an Emergency Use Authorization (EUA).  This EUA will remain in effect (meaning this test can  be used) for the duration of  the COVID-19 declaration  under Section 564(b)(1) of the Act, 21 U.S.C. section 360bbb-3(b)(1), unless the authorization is terminated or revoked sooner.     Influenza A by PCR NEGATIVE NEGATIVE Final   Influenza B by PCR NEGATIVE NEGATIVE Final    Comment: (NOTE) The Xpert Xpress SARS-CoV-2/FLU/RSV plus assay is intended as an aid in the diagnosis of influenza from Nasopharyngeal swab specimens and should not be used as a sole basis for treatment. Nasal washings and aspirates are unacceptable for Xpert Xpress SARS-CoV-2/FLU/RSV testing.  Fact Sheet for Patients: EntrepreneurPulse.com.au  Fact Sheet for  Healthcare Providers: IncredibleEmployment.be  This test is not yet approved or cleared by the [place] FDA and has been authorized for detection and/or diagnosis of SARS-CoV-2 by FDA under an Emergency Use Authorization (EUA). This EUA will remain in effect (meaning this test can be used) for the duration of the COVID-19 declaration under Section 564(b)(1) of the Act, 21 U.S.C. section 360bbb-3(b)(1), unless the authorization is terminated or revoked.  Performed at [facility], [place] [address] [place], [place] [address]     Radiology Reports CT ABDOMEN PELVIS W CONTRAST  Result Date: 07/27/2020 CLINICAL DATA:  Elevated liver function studies. EXAM: CT ABDOMEN AND PELVIS WITH CONTRAST TECHNIQUE: Multidetector CT imaging of the abdomen and pelvis was performed using the standard protocol following bolus administration of intravenous contrast. CONTRAST:  52m OMNIPAQUE IOHEXOL 300 MG/ML  SOLN COMPARISON:  None. FINDINGS: Lower chest: Moderate breathing motion artifact. There are emphysematous changes and pulmonary scarring but no definite infiltrates or effusions. The heart is within normal limits in size. Moderate aortic calcifications. Hepatobiliary: Diffuse and severe fatty infiltration of the liver with focal areas of more pronounced fatty change. No worrisome hepatic lesions or intrahepatic biliary dilatation. The gallbladder is unremarkable. No common bile duct dilatation. Pancreas: Advanced pancreatic atrophy but no mass, inflammation or ductal dilatation. Spleen: Normal size.  No focal lesions. Adrenals/Urinary Tract: Mild nodularity of both adrenal glands but no worrisome lesions. Scattered low-attenuation renal lesions are likely benign cysts. No worrisome renal lesions or hydronephrosis. 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 The bladder is unremarkable. Stomach/Bowel: The stomach, duodenum, small bowel and colon are grossly  normal. Vascular/Lymphatic: Advanced atherosclerotic calcifications involving the aorta and branch vessels but no aneurysm. The major venous structures are patent. No mesenteric or retroperitoneal mass or adenopathy. Small scattered lymph nodes are noted. Reproductive: The uterus and ovaries are unremarkable. Other: No pelvic mass or adenopathy. No free pelvic fluid collections. No inguinal mass or adenopathy. No abdominal wall hernia or subcutaneous lesions. Musculoskeletal: No significant bony findings. IMPRESSION: 1. Diffuse and severe fatty infiltration of the liver. 2. No acute abdominal/pelvic findings, mass lesions or adenopathy. 3. Advanced atherosclerotic calcifications involving the aorta and branch vessels. 4. 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 5. Emphysematous changes and pulmonary scarring at the lung bases. 6. Emphysema and aortic atherosclerosis. Aortic Atherosclerosis (ICD10-I70.0) and Emphysema (ICD10-J43.9). Electronically Signed   By: PMarijo SanesM.D.   On: 07/27/2020 19:46   DG Chest Port 1 View  Result Date: 08/01/2020 CLINICAL DATA:  Shortness of breath.  COVID. EXAM: PORTABLE CHEST 1 VIEW COMPARISON:  CT 01/15/2020.  Chest x-ray 08/18/2019, 05/11/2018. FINDINGS: Mediastinum and hilar structures are normal. COPD. Chronic bilateral interstitial changes are noted. Bibasilar subsegmental atelectasis and or scarring again noted. No acute infiltrate noted. Previously noted pulmonary nodules on CT of 01/16/2020 best evaluated by prior CT. No pleural effusion or pneumothorax. IMPRESSION:  1. COPD. Chronic bilateral interstitial changes and bibasilar subsegmental atelectasis and or scarring. 2. Previously noted pulmonary nodules noted on CT of 01/16/2020 best evaluated by CT. Reference is made to that report. Electronically Signed   By: Marcello Moores  Register   On: 08/01/2020 08:21   DG Chest Portable 1 View  Result Date: 07/27/2020 CLINICAL DATA:  SOB EXAM:  PORTABLE CHEST 1 VIEW COMPARISON:  01/16/2020 and prior. FINDINGS: Emphysematous changes. Patchy right predominant basilar opacities. No pneumothorax or pleural effusion. Cardiomediastinal silhouette within normal limits. IMPRESSION: Right predominant basilar opacities, atelectasis versus infiltrate. Emphysema. Electronically Signed   By: Primitivo Gauze M.D.   On: 07/27/2020 15:25   VAS [place] LOWER EXTREMITY VENOUS (DVT)  Result Date: 07/30/2020  Lower Venous DVT Study Indications: Covid, d-dimer.  Anticoagulation: Lovenox. Limitations: Poor ultrasound/tissue interface due to superficial skin changes. Comparison Study: 03-20-2020 Prior LT lower extremity venous was negative for                   DVT. Performing Technologist: Darlin Coco RDMS  Examination Guidelines: A complete evaluation includes B-mode imaging, spectral Doppler, color Doppler, and power Doppler as needed of all accessible portions of each vessel. Bilateral testing is considered an integral part of a complete examination. Limited examinations for reoccurring indications may be performed as noted. The reflux portion of the exam is performed with the patient in reverse Trendelenburg.  +---------+---------------+---------+-----------+----------+-------------------+ RIGHT    CompressibilityPhasicitySpontaneityPropertiesThrombus Aging      +---------+---------------+---------+-----------+----------+-------------------+ CFV      Full           Yes      Yes                                      +---------+---------------+---------+-----------+----------+-------------------+ SFJ      Full                                                             +---------+---------------+---------+-----------+----------+-------------------+ FV Prox  Full                                                             +---------+---------------+---------+-----------+----------+-------------------+ FV Mid   Full                                                              +---------+---------------+---------+-----------+----------+-------------------+ FV DistalFull                                                             +---------+---------------+---------+-----------+----------+-------------------+ PFV      Full                                                             +---------+---------------+---------+-----------+----------+-------------------+  POP      Full           Yes      Yes                                      +---------+---------------+---------+-----------+----------+-------------------+ PTV      Full                                         Some segments not                                                         well visualized     +---------+---------------+---------+-----------+----------+-------------------+ PERO     Full                                         Some segments not                                                         well visualized     +---------+---------------+---------+-----------+----------+-------------------+   +---------+---------------+---------+-----------+----------+-------------------+ LEFT     CompressibilityPhasicitySpontaneityPropertiesThrombus Aging      +---------+---------------+---------+-----------+----------+-------------------+ CFV      Full           Yes      Yes                                      +---------+---------------+---------+-----------+----------+-------------------+ SFJ      Full                                                             +---------+---------------+---------+-----------+----------+-------------------+ FV Prox  Full                                                             +---------+---------------+---------+-----------+----------+-------------------+ FV Mid   Full                                                              +---------+---------------+---------+-----------+----------+-------------------+ FV DistalFull                                                             +---------+---------------+---------+-----------+----------+-------------------+  PFV      Full                                                             +---------+---------------+---------+-----------+----------+-------------------+ POP      Full           Yes      Yes                                      +---------+---------------+---------+-----------+----------+-------------------+ PTV      Full                                         Some segments not                                                         well visualized     +---------+---------------+---------+-----------+----------+-------------------+ PERO     Full                                         Some segments not                                                         well visualized     +---------+---------------+---------+-----------+----------+-------------------+     Summary: RIGHT: - There is no evidence of deep vein thrombosis in the lower extremity. However, portions of this examination were limited- see technologist comments above.  - No cystic structure found in the popliteal fossa.  LEFT: - There is no evidence of deep vein thrombosis in the lower extremity. However, portions of this examination were limited- see technologist comments above.  - No cystic structure found in the popliteal fossa.  *See table(s) above for measurements and observations. Electronically signed by Ruta Hinds MD on 07/30/2020 at 4:19:16 PM.    Final    ECHOCARDIOGRAM LIMITED  Result Date: 07/28/2020    ECHOCARDIOGRAM LIMITED REPORT   Patient Name:   BRITTINI BRUBECK [facility] Date of Exam: 07/28/2020 Medical Rec #:  [id number]       Height:       62.0 in Accession #:    [id number]      Weight:       108.0 lb Date of Birth:  [birth year]       BSA:          1.471 m  Patient Age:    70 years        BP:           114/78 mmHg Patient Gender: F               HR:  83 bpm. Exam Location:  Inpatient Procedure: Limited Echo, Limited Color Doppler and Cardiac Doppler Indications:    atrial fibrillation  History:        Patient has no prior history of Echocardiogram examinations.                 Covid and COPD; Risk Factors:Hypertension.  Sonographer:    Johny Chess Referring Phys: [place]  1. Left ventricular ejection fraction, by estimation, is 55 to 60%. The left ventricle has normal function. The left ventricle has no regional wall motion abnormalities. Left ventricular diastolic parameters were normal.  2. Right ventricular systolic function is normal. The right ventricular size is mildly enlarged.  3. The mitral valve is normal in structure. No evidence of mitral valve regurgitation.  4. The aortic valve is tricuspid. Aortic valve regurgitation is not visualized. No aortic stenosis is present.  5. The inferior vena cava is dilated in size with >50% respiratory variability, suggesting right atrial pressure of 8 mmHg. Comparison(s): No prior Echocardiogram. FINDINGS  Left Ventricle: Left ventricular ejection fraction, by estimation, is 55 to 60%. The left ventricle has normal function. The left ventricle has no regional wall motion abnormalities. The left ventricular internal cavity size was normal in size. There is  no left ventricular hypertrophy. Left ventricular diastolic parameters were normal. Right Ventricle: The right ventricular size is mildly enlarged. Right ventricular systolic function is normal. Mitral Valve: The mitral valve is normal in structure. Tricuspid Valve: The tricuspid valve is normal in structure. Tricuspid valve regurgitation is not demonstrated. Aortic Valve: The aortic valve is tricuspid. Aortic valve regurgitation is not visualized. No aortic stenosis is present. Aorta: The aortic root is normal in size and  structure and the ascending aorta was not well visualized. Venous: The inferior vena cava is dilated in size with greater than 50% respiratory variability, suggesting right atrial pressure of 8 mmHg. LEFT VENTRICLE PLAX 2D LVIDd:         4.40 cm  Diastology LVIDs:         2.50 cm  LV e' medial:    10.60 cm/s LV PW:         0.80 cm  LV E/e' medial:  7.9 LV IVS:        0.70 cm  LV e' lateral:   11.50 cm/s LVOT diam:     1.90 cm  LV E/e' lateral: 7.3 LV SV:         66 LV SV Index:   45 LVOT Area:     2.84 cm  IVC IVC diam: 2.40 cm LEFT ATRIUM         Index LA diam:    3.70 cm 2.51 cm/m  AORTIC VALVE LVOT Vmax:   137.00 cm/s LVOT Vmean:  87.500 cm/s LVOT VTI:    0.233 m  AORTA Ao Root diam: 2.90 cm MITRAL VALVE MV Area (PHT): 4.10 cm    SHUNTS MV Decel Time: 185 msec    Systemic VTI:  0.23 m MV E velocity: 83.60 cm/s  Systemic Diam: 1.90 cm MV A velocity: 71.10 cm/s MV E/A ratio:  1.18 Rudean Haskell MD Electronically signed by Rudean Haskell MD Signature Date/Time: 07/28/2020/5:07:15 PM    Final

## 2020-08-06 DIAGNOSIS — U071 COVID-19: Secondary | ICD-10-CM | POA: Diagnosis not present

## 2020-08-06 DIAGNOSIS — J449 Chronic obstructive pulmonary disease, unspecified: Secondary | ICD-10-CM

## 2020-08-06 DIAGNOSIS — I4891 Unspecified atrial fibrillation: Secondary | ICD-10-CM | POA: Diagnosis not present

## 2020-08-06 DIAGNOSIS — Z66 Do not resuscitate: Secondary | ICD-10-CM

## 2020-08-06 DIAGNOSIS — J9611 Chronic respiratory failure with hypoxia: Secondary | ICD-10-CM | POA: Diagnosis not present

## 2020-08-06 DIAGNOSIS — E43 Unspecified severe protein-calorie malnutrition: Secondary | ICD-10-CM

## 2020-08-06 LAB — GLUCOSE, CAPILLARY: Glucose-Capillary: 185 mg/dL — ABNORMAL HIGH (ref 70–99)

## 2020-08-06 NOTE — Progress Notes (Signed)
Physical Therapy Treatment Patient Details Name: [name] MRN: [id number] DOB: 24-Aug-1945 Today's Date: 08/06/2020    History of Present Illness Pt is a 75 y.o. female admitted 07/27/20 with worsening cough and SOB; tested (+) COVID-19. Workup for afib with RVR, COPD exacerbation, potential COVID-19 PNA. PMH includes COPD (wears 3L O2 baseline), pulmonary HTN, ETOH use, anemia, exzematous dermatitis.   PT Comments    Pt with increased fatigue and DOE 4/4 with minimal activity; pt anxious regarding SOB, requiring min-modA to stand with RW and prolonged seated rest to recover. Pt with multiple bouts of bowel incontinence requiring assist for pericare. SpO2 88-93% on 3L O2 [place] (pt wears 3-4 L O2 baseline), HR 92. Continue to recommend SNF-level therapies to maximize functional mobility and decrease caregiver burden.    Follow Up Recommendations  SNF;Supervision/Assistance - 24 hour     Equipment Recommendations  Wheelchair (measurements PT);Wheelchair cushion (measurements PT);3in1 (PT)    Recommendations for Other Services       Precautions / Restrictions Precautions Precautions: Fall;Other (comment) Precaution Comments: H/o COPD wears 3-4L O2 baseline Restrictions Weight Bearing Restrictions: No    Mobility  Bed Mobility               General bed mobility comments: received sitting in recliner    Transfers Overall transfer level: Needs assistance Equipment used: Rolling walker (2 wheeled) Transfers: Sit to/from Stand Sit to Stand: Mod assist;Min assist         General transfer comment: Initial modA to assist trunk elevation standing from recliner to RW; 2x additional stands from recliner with minA for trunk elevation and stability, increased time and effort; prolonged seated rest to recover between standing bouts, pt with DOE 4/4 and states, "I can't breathe" - SpO2 >/88% on 3L O2 [place]  Ambulation/Gait             General Gait Details: Deferred due to  multiple bouts of bladder/bowel incontinence   Stairs             Wheelchair Mobility    Modified Rankin (Stroke Patients Only)       Balance Overall balance assessment: Needs assistance Sitting-balance support: No upper extremity supported;Feet supported Sitting balance-Leahy Scale: Fair Sitting balance - Comments: Required assist to doff underwear sitting at edge of chair   Standing balance support: Bilateral upper extremity supported;During functional activity;Single extremity supported Standing balance-Leahy Scale: Poor Standing balance comment: Reliant on UE support and external assist; dependent for posterior pericare while standing                            Cognition Arousal/Alertness: Awake/alert Behavior During Therapy: WFL for tasks assessed/performed;Anxious Overall Cognitive Status: No family/caregiver present to determine baseline cognitive functioning Area of Impairment: Attention;Following commands;Safety/judgement;Awareness;Problem solving;Memory                   Current Attention Level: Selective Memory: Decreased short-term memory Following Commands: Follows one step commands with increased time Safety/Judgement: Decreased awareness of deficits Awareness: Emergent Problem Solving: Slow processing;Requires verbal cues        Exercises      General Comments General comments (skin integrity, edema, etc.): Pt anxious regarding SOB; "I have to stay still for a long time to feel like I can breathe again." SpO2 92% on 3L O2 [place], HR 92. Extremely frail/cachectic      Pertinent Vitals/Pain Pain Assessment: Faces Faces Pain Scale: Hurts a little bit Pain  Location: Generalized, specifically chest with coughing/breathing Pain Descriptors / Indicators: Tiring;Discomfort Pain Intervention(s): Monitored during session;Limited activity within patient's tolerance    Home Living                      Prior Function             PT Goals (current goals can now be found in the care plan section) Progress towards PT goals: Not progressing toward goals - comment (limited by fatigue, DOE 4/4 with minimal activity)    Frequency    Min 2X/week      PT Plan Frequency needs to be updated    Co-evaluation              AM-PAC PT "6 Clicks" Mobility   Outcome Measure  Help needed turning from your back to your side while in a flat bed without using bedrails?: A Little Help needed moving from lying on your back to sitting on the side of a flat bed without using bedrails?: A Little Help needed moving to and from a bed to a chair (including a wheelchair)?: A Lot Help needed standing up from a chair using your arms (e.g., wheelchair or bedside chair)?: A Lot Help needed to walk in hospital room?: A Lot Help needed climbing 3-5 steps with a railing? : Total 6 Click Score: 13    End of Session Equipment Utilized During Treatment: Oxygen Activity Tolerance: Patient limited by fatigue;Treatment limited secondary to medical complications (Comment) Patient left: in chair;with call bell/phone within reach Nurse Communication: Mobility status (pt wanting time and privacy to have BM (declined transfer to [facility]), then pt will call for assist to get cleaned up) PT Visit Diagnosis: Unsteadiness on feet (R26.81);Muscle weakness (generalized) (M62.81);Difficulty in walking, not elsewhere classified (R26.2)     Time: 4970-2637 PT Time Calculation (min) (ACUTE ONLY): 16 min  Charges:  $Therapeutic Activity: 8-22 mins                     Mabeline Caras, PT, DPT Acute Rehabilitation Services  Pager [phone] Office [phone]  Derry Lory 08/06/2020, 5:06 PM

## 2020-08-06 NOTE — Progress Notes (Signed)
PROGRESS NOTE                                                                                                                                                                                                             Patient Demographics:    [name], is a 75 y.o. female, DOB - 05/07/1946, KGY:[id number]  Outpatient Primary MD for the patient is Christain Sacramento, MD   Admit date - 07/27/2020   LOS - 10  Chief Complaint  Patient presents with  . Shortness of Breath  . Chest Pain    Started two weeks ago with progression to severe difficulty breathing and "bad cp"       Brief Narrative: Patient is a 75 y.o. female with PMHx of EtOH use, anemia of chronic disease, eczematous dermatitis, COPD on 3 L of oxygen at home, pulmonary hypertension-who presented with worsening cough-shortness of breath-found to have A. fib with RVR.  Started on Cardizem and heparin infusion-subsequently admitted to the hospitalist service.  COVID-19 vaccinated status: Vaccinated but not boosted  Significant Events: 2/12>> Admit to [facility] for COPD exacerbation-A. fib RVR-COVID-19 positive  Significant studies: 2/11>>Chest x-ray: Right> left basilar opacities 2/11>> CT abdomen/pelvis: No acute abdominal/pelvic findings, diffuse/severe fatty infiltration of liver 2/12>> Leg [place] - No DVT 2/13 TTE - IMPRESSIONS  1. Left ventricular ejection fraction, by estimation, is 55 to 60%. The left ventricle has normal function. The left ventricle has no regional wall motion abnormalities. Left ventricular diastolic parameters were normal.  2. Right ventricular systolic function is normal. The right ventricular size is mildly enlarged.  3. The mitral valve is normal in structure. No evidence of mitral valve regurgitation.  4. The aortic valve is tricuspid. Aortic valve regurgitation is not visualized. No aortic stenosis is present.  5. The inferior vena cava is  dilated in size with >50% respiratory variability, suggesting right atrial pressure of 8 mmHg  COVID-19 medications: Steroids: 2/12>>  Antibiotics: Doxycycline: 2/12>>  Microbiology data: None  Procedures: None  Consults: Cardiology, Pall Care  DVT prophylaxis: enoxaparin (LOVENOX) injection 40 mg Start: 07/29/20 1000 SCDs Start: 07/27/20 2311     Subjective:   Seen in bed denies any headache chest or abdominal pain, shortness breath much improved, no abdominal pain no focal weakness.   Assessment  & Plan :   COPD exacerbation: Probably  triggered by COVID-19 infection-now on IV Solu-Medrol and doxycycline, scheduled Xopenex-add Incruse inhaler.  She is stable-moving air-appears comfortable.  Back on her usual 3-4 L of oxygen (home regimen), single dose Lasix on 08/02/2020 as she has some rales on exam, note she has mild chronic shortness of breath due to advanced underlying COPD, already appears extremely frail and cachectic, long-term prognosis does not look good, palliative care involved patient now deciding between residential and home hospice versus home with palliative care.  She at this point wants to go to SNF on 08/07/2020, she realizes that her prognosis is very poor and will consider transitioning to hospice if there is further decline, she remains DNR, I have clearly explained to her multiple times that her prognosis is extremely poor.  ?COVID-19 PNA: Possible pneumonic changes in x-ray chest-CRP elevated-however LFTs are significantly elevated.  Will treat with steroids-avoid Remdesivir due to significantly elevated LFTs.  Hypoxemia is at baseline-on usual 3-4 L of oxygen-no role for immunomodulators at this time.  She is currently stable on 4 to 5 L of oxygen and will leave her at that as she appears comfortable at this level.  Recent Labs  Lab 07/31/20 0430 08/01/20 0204 08/02/20 0045 08/03/20 0119  WBC 11.7* 9.2 11.0* 10.6*  HGB 8.3* 8.2* 8.5* 8.3*  HCT 26.5*  26.1* 26.6* 25.9*  PLT 121* 111* 124* 136*  CRP 1.0* 0.7 0.6 0.6  BNP 561.1* 468.9* 236.8* 303.5*  DDIMER 3.14* 3.04*  --   --   AST 70* 51* 50* 46*  ALT 135* 105* 104* 91*  ALKPHOS 121 101 99 86  BILITOT 0.8 0.7 0.6 0.3  ALBUMIN 2.6* 2.4* 2.6* 2.6*  INR 0.9 0.9 1.0  --       PAF with RVR: Back in sinus rhythm-stop Cardizem infusion-starting short acting Cardizem-on IV heparin currently.  She is very frail-lives alone-alcohol use-has chronic normocytic anemia-I agree with cardiology that she is a poor long-term candidate for anticoagulation.  Probably better to stick with aspirin.  Echo noted, on aspirin only now.  Elevated D-dimer. -ve leg ultrasound, on prophylactic Lovenox, monitor, poor candidate for anticoagulation.    Acute alcoholic hepatitis: LFTs significantly elevated-but seems to be slowly downtrending-discriminant function only 5.  No role for steroids-CT abdomen with significant fatty infiltration.  Supportive care-avoid hepatotoxic medications.  Follow LFTs  EtOH withdrawal: Tremulous-but awake/alert.  Claims that she drinks at least one 12 ounce beer-along with 3-4 shots of liquor on a daily basis.  Last drink was 2/11.  No signs of DTs.  Normocytic anemia: Anemia panel suggestive of anemia of chronic disease, supportive care.  History of pulmonary hypertension: Due to COPD-outpatient follow-up with pulmonary.  Continue oxygen supplementation as needed.  COPD appears extremely advanced and terminal.  Deconditioning/debility/frailty: SNF once stable.  Depression: Hold bupropion-allow LFTs to improve further before initiating.  Eczematous dermatitis-keratotic lesions in left breast/bilateral lower extremities - outpatient dermatology follow-up.   RN pressure injury documentation: Pressure Injury 08/19/19 Sacrum Mid Stage 2 -  Partial thickness loss of dermis presenting as a shallow open injury with a red, pink wound bed without slough. (Active)  08/19/19 0200   Location: Sacrum  Location Orientation: Mid  Staging: Stage 2 -  Partial thickness loss of dermis presenting as a shallow open injury with a red, pink wound bed without slough.  Wound Description (Comments):   Present on Admission: Yes    GI prophylaxis: PPI   Condition - Stable  Family Communication  :  Daughter-Donna-[phone] previous  MD updated over the phone on 2/12- agreed with avoiding anticoagulation given alcohol use/safety-understands embolic/CVA risk, updated 07/29/2020 essential left at 11:37 AM, 08/01/20 message left at 11:13 AM.  Patient now wishes her daughter not to be involved in patient's care.  Code Status :  Full Code  Diet :  Diet Order            Diet regular Room service appropriate? Yes; Fluid consistency: Thin  Diet effective now                  Disposition Plan  :  Status is: Inpatient  Remains inpatient appropriate because:Inpatient level of care appropriate due to severity of illness  Dispo: The patient is from: Home              Anticipated d/c is to: SNF              Anticipated d/c date is: > 3 days              Patient currently is not medically stable to d/c.   Difficult to place patient No  Barriers to discharge: COPD exacerbation on IV steroids, COVID-19 infection, alcoholic hepatitis-needs continued inpatient monitoring/treatment.  Antimicorbials  :    Anti-infectives (From admission, onward)   Start     Dose/Rate Route Frequency Ordered Stop   07/28/20 1000  doxycycline (VIBRA-TABS) tablet 100 mg        100 mg Oral Every 12 hours 07/28/20 0813 08/01/20 1741      Inpatient Medications  Scheduled Meds: . aspirin  81 mg Oral Daily  . busPIRone  5 mg Oral BID  . diltiazem  60 mg Oral Q6H  . enoxaparin (LOVENOX) injection  40 mg Subcutaneous Q24H  . feeding supplement  237 mL Oral TID BM  . folic acid  1 mg Oral Daily  . isosorbide mononitrate  30 mg Oral Daily  . levalbuterol  2 puff Inhalation BID  . mouth rinse  15 mL  Mouth Rinse BID  . methylPREDNISolone (SOLU-MEDROL) injection  40 mg Intravenous BID  . mometasone-formoterol  2 puff Inhalation BID  . multivitamin with minerals  1 tablet Oral Daily  . pantoprazole  40 mg Oral Q1200  . thiamine  100 mg Oral Daily   Or  . thiamine  100 mg Intravenous Daily  . umeclidinium bromide  1 puff Inhalation Daily   Continuous Infusions:  PRN Meds:.acetaminophen, albuterol, benzonatate, guaiFENesin-dextromethorphan, HYDROcodone-acetaminophen, lip balm, morphine injection, ondansetron (ZOFRAN) IV   Time Spent in minutes  25  See all Orders from today for further details   Lala Lund M.D on 08/06/2020 at 9:45 AM  To page go to [email or web address] - use universal password  [facility] -  Office  [phone]    Objective:   Vitals:   08/05/20 1625 08/05/20 2017 08/06/20 0428 08/06/20 0724  BP: 128/66 116/70 129/63 116/75  Pulse: 87 75 67 67  Resp: _0 Temp: 98.4 F (36.9 C) 98.4 F (36.9 C) 98.8 F (37.1 C) 98.6 F (37 C)  TempSrc: Oral Oral Oral Oral  SpO2: 99% 98% 100% 99%  Weight:      Height:        Wt Readings from Last 3 Encounters:  07/30/20 50.2 kg  06/22/20 51.5 kg  02/13/20 50.2 kg     Intake/Output Summary (Last 24 hours) at 08/06/2020 0945 Last data filed at 08/06/2020 0941 Gross per 24 hour  Intake 360 ml  Output  600 ml  Net -240 ml     Physical Exam  Extremely frail, cachectic elderly white female, in no acute distress, no focal deficits .AT,PERRAL Supple Neck,No JVD, No cervical lymphadenopathy appriciated.  Symmetrical Chest wall movement, Good air movement bilaterally, CTAB RRR,No Gallops, Rubs or new Murmurs, No Parasternal Heave +ve B.Sounds, Abd Soft, No tenderness, No organomegaly appriciated, No rebound - guarding or rigidity. Chronic diffuse bruising from previous lab draws/fals, frail capillaries, chronic scaly rash both legs & L.chest wall   Data Review:    CBC  Recent Labs  Lab  07/31/20 0430 08/01/20 0204 08/02/20 0045 08/03/20 0119  WBC 11.7* 9.2 11.0* 10.6*  HGB 8.3* 8.2* 8.5* 8.3*  HCT 26.5* 26.1* 26.6* 25.9*  PLT 121* 111* 124* 136*  MCV 99.6 99.6 98.5 99.2  MCH 31.2 31.3 31.5 31.8  MCHC 31.3 31.4 32.0 32.0  RDW 18.3* 18.5* 18.7* 19.3*    Chemistries  Recent Labs  Lab 07/31/20 0430 08/01/20 0204 08/02/20 0045 08/03/20 0119  NA 138 137 137 138  K 3.8 3.9 3.7 3.9  CL 93* 93* 89* 88*  CO2 38* 37* 39* 38*  GLUCOSE 195* 200* 224* 229*  BUN 16 19 27* 30*  CREATININE 0.47 0.47 0.90 0.72  CALCIUM 8.5* 8.5* 8.6* 8.9  MG 1.9 1.9 2.1 2.0  AST 70* 51* 50* 46*  ALT 135* 105* 104* 91*  ALKPHOS 121 101 99 86  BILITOT 0.8 0.7 0.6 0.3   ------------------------------------------------------------------------------------------------------------------ No results for input(s): CHOL, HDL, LDLCALC, TRIG, CHOLHDL, LDLDIRECT in the last 72 hours.  Lab Results  Component Value Date   HGBA1C 6.1 (H) 05/13/2018   ------------------------------------------------------------------------------------------------------------------ No results for input(s): TSH, T4TOTAL, T3FREE, THYROIDAB in the last 72 hours.  Invalid input(s): FREET3 ------------------------------------------------------------------------------------------------------------------ No results for input(s): VITAMINB12, FOLATE, FERRITIN, TIBC, IRON, RETICCTPCT in the last 72 hours.  Coagulation profile Recent Labs  Lab 07/31/20 0430 08/01/20 0204 08/02/20 0045  INR 0.9 0.9 1.0    No results for input(s): DDIMER in the last 72 hours.  Cardiac Enzymes No results for input(s): CKMB, TROPONINI, MYOGLOBIN in the last 168 hours.  Invalid input(s): CK ------------------------------------------------------------------------------------------------------------------    Component Value Date/Time   BNP 303.5 (H) 08/03/2020 0119    Micro Results Recent Results (from the past 240 hour(s))  Resp  Panel by RT-PCR (Flu A&B, Covid) Nasopharyngeal Swab     Status: Abnormal   Collection Time: 07/27/20  3:28 PM   Specimen: Nasopharyngeal Swab; Nasopharyngeal(NP) swabs in vial transport medium  Result Value Ref Range Status   SARS Coronavirus 2 by RT PCR POSITIVE (A) NEGATIVE Final    Comment: RESULT CALLED TO, READ BACK BY AND VERIFIED WITH: Wyvonna Plum RN [id number] 07/27/20 A BROWNING (NOTE) SARS-CoV-2 target nucleic acids are DETECTED.  The SARS-CoV-2 RNA is generally detectable in upper respiratory specimens during the acute phase of infection. Positive results are indicative of the presence of the identified virus, but do not rule out bacterial infection or co-infection with other pathogens not detected by the test. Clinical correlation with patient history and other diagnostic information is necessary to determine patient infection status. The expected result is Negative.  Fact Sheet for Patients: EntrepreneurPulse.com.au  Fact Sheet for Healthcare Providers: IncredibleEmployment.be  This test is not yet approved or cleared by the [place] FDA and  has been authorized for detection and/or diagnosis of SARS-CoV-2 by FDA under an Emergency Use Authorization (EUA).  This EUA will remain in effect (meaning this test can  be used)  for the duration of  the COVID-19 declaration under Section 564(b)(1) of the Act, 21 U.S.C. section 360bbb-3(b)(1), unless the authorization is terminated or revoked sooner.     Influenza A by PCR NEGATIVE NEGATIVE Final   Influenza B by PCR NEGATIVE NEGATIVE Final    Comment: (NOTE) The Xpert Xpress SARS-CoV-2/FLU/RSV plus assay is intended as an aid in the diagnosis of influenza from Nasopharyngeal swab specimens and should not be used as a sole basis for treatment. Nasal washings and aspirates are unacceptable for Xpert Xpress SARS-CoV-2/FLU/RSV testing.  Fact Sheet for  Patients: EntrepreneurPulse.com.au  Fact Sheet for Healthcare Providers: IncredibleEmployment.be  This test is not yet approved or cleared by the [place] FDA and has been authorized for detection and/or diagnosis of SARS-CoV-2 by FDA under an Emergency Use Authorization (EUA). This EUA will remain in effect (meaning this test can be used) for the duration of the COVID-19 declaration under Section 564(b)(1) of the Act, 21 U.S.C. section 360bbb-3(b)(1), unless the authorization is terminated or revoked.  Performed at [facility], [place] [address] [place], [place] [address]     Radiology Reports CT ABDOMEN PELVIS W CONTRAST  Result Date: 07/27/2020 CLINICAL DATA:  Elevated liver function studies. EXAM: CT ABDOMEN AND PELVIS WITH CONTRAST TECHNIQUE: Multidetector CT imaging of the abdomen and pelvis was performed using the standard protocol following bolus administration of intravenous contrast. CONTRAST:  71m OMNIPAQUE IOHEXOL 300 MG/ML  SOLN COMPARISON:  None. FINDINGS: Lower chest: Moderate breathing motion artifact. There are emphysematous changes and pulmonary scarring but no definite infiltrates or effusions. The heart is within normal limits in size. Moderate aortic calcifications. Hepatobiliary: Diffuse and severe fatty infiltration of the liver with focal areas of more pronounced fatty change. No worrisome hepatic lesions or intrahepatic biliary dilatation. The gallbladder is unremarkable. No common bile duct dilatation. Pancreas: Advanced pancreatic atrophy but no mass, inflammation or ductal dilatation. Spleen: Normal size.  No focal lesions. Adrenals/Urinary Tract: Mild nodularity of both adrenal glands but no worrisome lesions. Scattered low-attenuation renal lesions are likely benign cysts. No worrisome renal lesions or hydronephrosis. 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 The bladder is unremarkable.  Stomach/Bowel: The stomach, duodenum, small bowel and colon are grossly normal. Vascular/Lymphatic: Advanced atherosclerotic calcifications involving the aorta and branch vessels but no aneurysm. The major venous structures are patent. No mesenteric or retroperitoneal mass or adenopathy. Small scattered lymph nodes are noted. Reproductive: The uterus and ovaries are unremarkable. Other: No pelvic mass or adenopathy. No free pelvic fluid collections. No inguinal mass or adenopathy. No abdominal wall hernia or subcutaneous lesions. Musculoskeletal: No significant bony findings. IMPRESSION: 1. Diffuse and severe fatty infiltration of the liver. 2. No acute abdominal/pelvic findings, mass lesions or adenopathy. 3. Advanced atherosclerotic calcifications involving the aorta and branch vessels. 4. 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 5. Emphysematous changes and pulmonary scarring at the lung bases. 6. Emphysema and aortic atherosclerosis. Aortic Atherosclerosis (ICD10-I70.0) and Emphysema (ICD10-J43.9). Electronically Signed   By: PMarijo SanesM.D.   On: 07/27/2020 19:46   DG Chest Port 1 View  Result Date: 08/01/2020 CLINICAL DATA:  Shortness of breath.  COVID. EXAM: PORTABLE CHEST 1 VIEW COMPARISON:  CT 01/15/2020.  Chest x-ray 08/18/2019, 05/11/2018. FINDINGS: Mediastinum and hilar structures are normal. COPD. Chronic bilateral interstitial changes are noted. Bibasilar subsegmental atelectasis and or scarring again noted. No acute infiltrate noted. Previously noted pulmonary nodules on CT of 01/16/2020 best evaluated by  prior CT. No pleural effusion or pneumothorax. IMPRESSION: 1. COPD. Chronic bilateral interstitial changes and bibasilar subsegmental atelectasis and or scarring. 2. Previously noted pulmonary nodules noted on CT of 01/16/2020 best evaluated by CT. Reference is made to that report. Electronically Signed   By: Marcello Moores  Register   On: 08/01/2020 08:21   DG Chest  Portable 1 View  Result Date: 07/27/2020 CLINICAL DATA:  SOB EXAM: PORTABLE CHEST 1 VIEW COMPARISON:  01/16/2020 and prior. FINDINGS: Emphysematous changes. Patchy right predominant basilar opacities. No pneumothorax or pleural effusion. Cardiomediastinal silhouette within normal limits. IMPRESSION: Right predominant basilar opacities, atelectasis versus infiltrate. Emphysema. Electronically Signed   By: Primitivo Gauze M.D.   On: 07/27/2020 15:25   VAS [place] LOWER EXTREMITY VENOUS (DVT)  Result Date: 07/30/2020  Lower Venous DVT Study Indications: Covid, d-dimer.  Anticoagulation: Lovenox. Limitations: Poor ultrasound/tissue interface due to superficial skin changes. Comparison Study: 03-20-2020 Prior LT lower extremity venous was negative for                   DVT. Performing Technologist: Darlin Coco RDMS  Examination Guidelines: A complete evaluation includes B-mode imaging, spectral Doppler, color Doppler, and power Doppler as needed of all accessible portions of each vessel. Bilateral testing is considered an integral part of a complete examination. Limited examinations for reoccurring indications may be performed as noted. The reflux portion of the exam is performed with the patient in reverse Trendelenburg.  +---------+---------------+---------+-----------+----------+-------------------+ RIGHT    CompressibilityPhasicitySpontaneityPropertiesThrombus Aging      +---------+---------------+---------+-----------+----------+-------------------+ CFV      Full           Yes      Yes                                      +---------+---------------+---------+-----------+----------+-------------------+ SFJ      Full                                                             +---------+---------------+---------+-----------+----------+-------------------+ FV Prox  Full                                                              +---------+---------------+---------+-----------+----------+-------------------+ FV Mid   Full                                                             +---------+---------------+---------+-----------+----------+-------------------+ FV DistalFull                                                             +---------+---------------+---------+-----------+----------+-------------------+ PFV      Full                                                             +---------+---------------+---------+-----------+----------+-------------------+  POP      Full           Yes      Yes                                      +---------+---------------+---------+-----------+----------+-------------------+ PTV      Full                                         Some segments not                                                         well visualized     +---------+---------------+---------+-----------+----------+-------------------+ PERO     Full                                         Some segments not                                                         well visualized     +---------+---------------+---------+-----------+----------+-------------------+   +---------+---------------+---------+-----------+----------+-------------------+ LEFT     CompressibilityPhasicitySpontaneityPropertiesThrombus Aging      +---------+---------------+---------+-----------+----------+-------------------+ CFV      Full           Yes      Yes                                      +---------+---------------+---------+-----------+----------+-------------------+ SFJ      Full                                                             +---------+---------------+---------+-----------+----------+-------------------+ FV Prox  Full                                                             +---------+---------------+---------+-----------+----------+-------------------+ FV  Mid   Full                                                             +---------+---------------+---------+-----------+----------+-------------------+ FV DistalFull                                                             +---------+---------------+---------+-----------+----------+-------------------+  PFV      Full                                                             +---------+---------------+---------+-----------+----------+-------------------+ POP      Full           Yes      Yes                                      +---------+---------------+---------+-----------+----------+-------------------+ PTV      Full                                         Some segments not                                                         well visualized     +---------+---------------+---------+-----------+----------+-------------------+ PERO     Full                                         Some segments not                                                         well visualized     +---------+---------------+---------+-----------+----------+-------------------+     Summary: RIGHT: - There is no evidence of deep vein thrombosis in the lower extremity. However, portions of this examination were limited- see technologist comments above.  - No cystic structure found in the popliteal fossa.  LEFT: - There is no evidence of deep vein thrombosis in the lower extremity. However, portions of this examination were limited- see technologist comments above.  - No cystic structure found in the popliteal fossa.  *See table(s) above for measurements and observations. Electronically signed by Ruta Hinds MD on 07/30/2020 at 4:19:16 PM.    Final    ECHOCARDIOGRAM LIMITED  Result Date: 07/28/2020    ECHOCARDIOGRAM LIMITED REPORT   Patient Name:   DENIECE RANKIN [facility] Date of Exam: 07/28/2020 Medical Rec #:  [id number]       Height:       62.0 in Accession #:    [id number]       Weight:       108.0 lb Date of Birth:  1945-08-02       BSA:          1.471 m Patient Age:    18 years        BP:           114/78 mmHg Patient Gender: F               HR:  83 bpm. Exam Location:  Inpatient Procedure: Limited Echo, Limited Color Doppler and Cardiac Doppler Indications:    atrial fibrillation  History:        Patient has no prior history of Echocardiogram examinations.                 Covid and COPD; Risk Factors:Hypertension.  Sonographer:    Johny Chess Referring Phys: [place]  1. Left ventricular ejection fraction, by estimation, is 55 to 60%. The left ventricle has normal function. The left ventricle has no regional wall motion abnormalities. Left ventricular diastolic parameters were normal.  2. Right ventricular systolic function is normal. The right ventricular size is mildly enlarged.  3. The mitral valve is normal in structure. No evidence of mitral valve regurgitation.  4. The aortic valve is tricuspid. Aortic valve regurgitation is not visualized. No aortic stenosis is present.  5. The inferior vena cava is dilated in size with >50% respiratory variability, suggesting right atrial pressure of 8 mmHg. Comparison(s): No prior Echocardiogram. FINDINGS  Left Ventricle: Left ventricular ejection fraction, by estimation, is 55 to 60%. The left ventricle has normal function. The left ventricle has no regional wall motion abnormalities. The left ventricular internal cavity size was normal in size. There is  no left ventricular hypertrophy. Left ventricular diastolic parameters were normal. Right Ventricle: The right ventricular size is mildly enlarged. Right ventricular systolic function is normal. Mitral Valve: The mitral valve is normal in structure. Tricuspid Valve: The tricuspid valve is normal in structure. Tricuspid valve regurgitation is not demonstrated. Aortic Valve: The aortic valve is tricuspid. Aortic valve regurgitation is not visualized. No  aortic stenosis is present. Aorta: The aortic root is normal in size and structure and the ascending aorta was not well visualized. Venous: The inferior vena cava is dilated in size with greater than 50% respiratory variability, suggesting right atrial pressure of 8 mmHg. LEFT VENTRICLE PLAX 2D LVIDd:         4.40 cm  Diastology LVIDs:         2.50 cm  LV e' medial:    10.60 cm/s LV PW:         0.80 cm  LV E/e' medial:  7.9 LV IVS:        0.70 cm  LV e' lateral:   11.50 cm/s LVOT diam:     1.90 cm  LV E/e' lateral: 7.3 LV SV:         66 LV SV Index:   45 LVOT Area:     2.84 cm  IVC IVC diam: 2.40 cm LEFT ATRIUM         Index LA diam:    3.70 cm 2.51 cm/m  AORTIC VALVE LVOT Vmax:   137.00 cm/s LVOT Vmean:  87.500 cm/s LVOT VTI:    0.233 m  AORTA Ao Root diam: 2.90 cm MITRAL VALVE MV Area (PHT): 4.10 cm    SHUNTS MV Decel Time: 185 msec    Systemic VTI:  0.23 m MV E velocity: 83.60 cm/s  Systemic Diam: 1.90 cm MV A velocity: 71.10 cm/s MV E/A ratio:  1.18 Rudean Haskell MD Electronically signed by Rudean Haskell MD Signature Date/Time: 07/28/2020/5:07:15 PM    Final

## 2020-08-06 NOTE — Progress Notes (Signed)
Nutrition Follow-up  DOCUMENTATION CODES:   Severe malnutrition in context of chronic illness  INTERVENTION:   -Continue Ensure Enlive po TID, each supplement provides 350 kcal and 20 grams of protein -Magic cup TID with meals, each supplement provides 290 kcal and 9 grams of protein -MVI with minerals daily  NUTRITION DIAGNOSIS:   Severe Malnutrition related to chronic illness (COPD) as evidenced by energy intake < or equal to 75% for > or equal to 1 month,moderate fat depletion,severe fat depletion,severe muscle depletion,moderate muscle depletion,edema.  Ongoing  GOAL:   Patient will meet greater than or equal to 90% of their needs  Progressing   MONITOR:   PO intake,Supplement acceptance,Labs,Skin  REASON FOR ASSESSMENT:   Malnutrition Screening Tool    ASSESSMENT:   75 yo female admitted with COPD exacerbation, A fib with RVR, COVID-19 positive. PMH includes ETOH use, anemia of chronic disease, eczematous dermatitis, COPD, home oxygen 3L, pulmonary HTN.  Reviewed I/O's: +40 ml x 24 hours and -2.9 L since admission  UOP: 200 ml x 24 hours  Spoke with pt, who reports continued poor appetite, but improving. Noted meal completion 25-50%. Per pt, she consumed 50% of her breakfast this morning and "that's the most I've eaten in a few weeks". Pt also consuming about 1-2 Ensure supplements per day- noted she consumed about 50% of Ensure.   PTA, pt reports poor appetite, usually only consuming 1-2 meals per day. She reports this is the first day she felt like eating and is craving frito corn chips.   Pt endorses wt loss, however, is unsure how much. However, wt has been stable over the past year.   Discussed importance of good meal and supplement intake to promote healing. Pt amenable to [employer] and Ensure.   Labs reviewed: CBGS: 185.   NUTRITION - FOCUSED PHYSICAL EXAM:  Flowsheet Row Most Recent Value  Orbital Region Severe depletion  Upper Arm Region Severe  depletion  Thoracic and Lumbar Region Moderate depletion  Buccal Region Moderate depletion  Temple Region Severe depletion  Clavicle Bone Region Moderate depletion  Clavicle and Acromion Bone Region Moderate depletion  Scapular Bone Region Moderate depletion  Dorsal Hand Severe depletion  Patellar Region Severe depletion  Anterior Thigh Region Severe depletion  Posterior Calf Region Severe depletion  Edema (RD Assessment) Mild  Hair Reviewed  Eyes Reviewed  Mouth Reviewed  Skin Reviewed  Nails Reviewed       Diet Order:   Diet Order            Diet regular Room service appropriate? Yes; Fluid consistency: Thin  Diet effective now                 EDUCATION NEEDS:   Education needs have been addressed  Skin:  Skin Assessment: Skin Integrity Issues: Skin Integrity Issues:: Stage II Stage II: sacrum  Last BM:  08/06/20  Height:   Ht Readings from Last 1 Encounters:  07/30/20 5' 2" (1.575 m)    Weight:   Wt Readings from Last 1 Encounters:  07/30/20 50.2 kg    Ideal Body Weight:  50 kg  BMI:  Body mass index is 20.24 kg/m.  Estimated Nutritional Needs:   Kcal:  1500-1700  Protein:  75-85 gm  Fluid:  >/= 1.5 L    Loistine Chance, RD, LDN, [place] Registered Dietitian II Certified Diabetes Care and Education Specialist Please refer to [facility] for RD and/or RD on-call/weekend/after hours pager

## 2020-08-06 NOTE — Progress Notes (Signed)
Daily Progress Note   Patient Name: [name]       Date: 08/06/2020 DOB: Feb 27, 1946  Age: 75 y.o. MRN#: [id number] Attending Physician: Thurnell Lose, MD Primary Care Physician: Christain Sacramento, MD Admit Date: 07/27/2020  Reason for Follow-up: continued GOC discussion  Subjective: Noted disposition plan of SNF/rehab.   At bedside, I asked patient if she felt up to completing a MOST form to document her goals of care. She seems anxious, stating "whatever you want". I provided reassurance that she doesn't have to complete the form. Utilizing therapeutic communication, I attempted to find out what was bothering her. She states her chest is hurting, 10/10 and this has been going on for several months. I messaged her RN and asked her to bring a dose of pain medication. Patient expresses frustration that she doesn't feel much better than when she was admitted. She states "I should have never come to the hospital". She doesn't feel she is being helped in the hospital. Emotional support provided. Again reviewed that COPD is a chronic progressive illness that gets worse over time.   Patient expresses concern about the "fluid" on her arms, stating "it must be something with my heart". I explain that her albumin is low from malnourishment, which can cause third-spacing.  She also expresses concern that she is supposed to go to rehab, and she does't have any clothes. I offered to call her friend [name] and find out if someone can bring her clothes up to the hospital.   Discussed again that home hospice would be very appropriate considering her advanced COPD and need for symptom management. Patient indicates she is open to hospice care but knows she has to be stronger first.   18:45--Spoke with [name]  by phone. She states she will bring patient's clothes to the hospital tomorrow. I discussed that patient has been set-up to receive outpatient palliative care.   Length of Stay: 10  Current Medications: Scheduled Meds:  . aspirin  81 mg Oral Daily  . busPIRone  5 mg Oral BID  . diltiazem  60 mg Oral Q6H  . enoxaparin (LOVENOX) injection  40 mg Subcutaneous Q24H  . feeding supplement  237 mL Oral TID BM  . folic acid  1 mg Oral Daily  . isosorbide mononitrate  30 mg Oral  Daily  . levalbuterol  2 puff Inhalation BID  . mouth rinse  15 mL Mouth Rinse BID  . methylPREDNISolone (SOLU-MEDROL) injection  40 mg Intravenous BID  . mometasone-formoterol  2 puff Inhalation BID  . multivitamin with minerals  1 tablet Oral Daily  . pantoprazole  40 mg Oral Q1200  . thiamine  100 mg Oral Daily   Or  . thiamine  100 mg Intravenous Daily  . umeclidinium bromide  1 puff Inhalation Daily    Continuous Infusions:   PRN Meds: acetaminophen, albuterol, benzonatate, guaiFENesin-dextromethorphan, HYDROcodone-acetaminophen, lip balm, morphine injection, ondansetron (ZOFRAN) IV  Physical Exam Vitals reviewed.  Constitutional:      Appearance: She is cachectic. She is ill-appearing.  Cardiovascular:     Rate and Rhythm: Normal rate.  Pulmonary:     Effort: Accessory muscle usage present.  Neurological:     Mental Status: She is alert and oriented to person, place, and time.  Psychiatric:        Mood and Affect: Mood is anxious.             Vital Signs: BP 140/74 (BP Location: Right Arm)   Pulse 91   Temp 97.7 F (36.5 C) (Axillary)   Resp (!) 22   Ht _0  (1.575 m)   Wt 50.2 kg   SpO2 100%   BMI 20.24 kg/m  SpO2: SpO2: 100 % O2 Device: O2 Device: Nasal Cannula O2 Flow Rate: O2 Flow Rate (L/min): 3 L/min  Intake/output summary:   Intake/Output Summary (Last 24 hours) at 08/06/2020 1804 Last data filed at 08/06/2020 0941 Gross per 24 hour  Intake 240 ml  Output 600 ml  Net -360  ml   LBM: Last BM Date: 08/05/20 Baseline Weight: Weight: 49 kg Most recent weight: Weight: 50.2 kg       Palliative Assessment/Data: PPS 30%      Palliative Care Assessment & Plan   Patient Profile: 75 y.o.femalewith past medical history of advanced COPD on 3L oxygen at home, atrial fibrillation, failure to thrive, severe protein calorie malnutrition, pulmonary hypertension, and CAD. She was brought in the emergency department on2/11/2022with shortness of breath and weakness.Associated symptoms included palpitations and worsening productive cough.  ED Course: Found to be in A-fib with RVR. COVID-19 positive. Chest x-ray showing right predominant basilar opacities with atelectasis versus infiltrates. CT abdomen pelvis showed diffuse and severely fatty infiltration of the liver. Patient is tremulous and has a history of alcohol abuse. Patient was admitted to [facility] for further management of a-fib RVR, acute on chronic hypoxic respiratory failure, and alcohol withdraw.  Assessment: COPD exacerbation COVID19 pneumonia Atrial fibrillation with RVR Acute alcoholic hepatitis Deconditioning Fragility   Recommendations/Plan:  DNR/DNI as previously documented   Continue current medical treatment   Continue norco 7.5-325 mg every 6 hours prn moderate pain  Continue Buspirone (BUSPAR) 5 mg twice daily for anxiety  Patient's healthcare proxy would be her friend [name]  Patient would benefit from home hospice but does not have 24/7 support at home and needs to regain functional status first   Patient has been set-up to receive outpatient palliative care with [facility]  Goals of Care and Additional Recommendations:  Limitations on Scope of Treatment: Full Scope Treatment  Code Status:  DNR/DNI  Prognosis:   < 6 months  Discharge Planning:  [place] for rehab with Palliative care service follow-up  Care plan was discussed with Dr. Candiss Norse,  primary RN  Thank you for allowing the  Palliative Medicine Team to assist in the care of this patient.   Total Time 25 minutes Prolonged Time Billed  no       Greater than 50%  of this time was spent counseling and coordinating care related to the above assessment and plan.  Lavena Bullion, NP  Please contact Palliative Medicine Team phone at [phone] for questions and concerns.

## 2020-08-06 NOTE — Progress Notes (Signed)
Palliative Medicine RN Note: Rec'd a call from pt's surrogate, Elige Radon [phone]). She is trying to get an update on placement. Sent message to [employer].  Marjie Skiff Derian Dimalanta, RN, BSN, [facility] Palliative Medicine Team 08/06/2020 2:08 PM Office [phone]

## 2020-08-07 DIAGNOSIS — I4891 Unspecified atrial fibrillation: Secondary | ICD-10-CM | POA: Diagnosis not present

## 2020-08-07 MED ORDER — ISOSORBIDE MONONITRATE ER 30 MG PO TB24: 30.0000 mg | ORAL_TABLET | Freq: Every day | ORAL | Status: DC

## 2020-08-07 MED ORDER — ARFORMOTEROL TARTRATE 15 MCG/2ML IN NEBU: 15.0000 ug | INHALATION_SOLUTION | Freq: Two times a day (BID) | RESPIRATORY_TRACT | 6 refills | Status: DC

## 2020-08-07 MED ORDER — ASPIRIN 81 MG PO CHEW: 81.0000 mg | CHEWABLE_TABLET | Freq: Every day | ORAL | Status: DC

## 2020-08-07 MED ORDER — PANTOPRAZOLE SODIUM 40 MG PO TBEC: 40.0000 mg | DELAYED_RELEASE_TABLET | Freq: Every day | ORAL | Status: DC

## 2020-08-07 MED ORDER — GUAIFENESIN-DM 100-10 MG/5ML PO SYRP: 10.0000 mL | ORAL_SOLUTION | ORAL | 0 refills | Status: AC | PRN

## 2020-08-07 MED ORDER — HYDROCODONE-ACETAMINOPHEN 7.5-325 MG PO TABS: 1.0000 | ORAL_TABLET | Freq: Four times a day (QID) | ORAL | 0 refills | Status: DC | PRN

## 2020-08-07 NOTE — Discharge Instructions (Signed)
Follow with Primary MD Christain Sacramento, MD in 7 days   Get CBC, CMP, 2 view Chest X ray -  checked next visit within 1 week by Primary MD or SNF MD.  Activity: As tolerated with Full fall precautions use walker/cane & assistance as needed  Disposition SNF  Diet: Heart Healthy   Special Instructions: If you have smoked or chewed Tobacco  in the last 2 yrs please stop smoking, stop any regular Alcohol  and or any Recreational drug use.  On your next visit with your primary care physician please Get Medicines reviewed and adjusted.  Please request your Prim.MD to go over all Hospital Tests and Procedure/Radiological results at the follow up, please get all Hospital records sent to your Prim MD by signing hospital release before you go home.  If you experience worsening of your admission symptoms, develop shortness of breath, life threatening emergency, suicidal or homicidal thoughts you must seek medical attention immediately by calling 911 or calling your MD immediately  if symptoms less severe.  You Must read complete instructions/literature along with all the possible adverse reactions/side effects for all the Medicines you take and that have been prescribed to you. Take any new Medicines after you have completely understood and accpet all the possible adverse reactions/side effects.

## 2020-08-07 NOTE — Progress Notes (Incomplete)
Daily Progress Note   Patient Name: [name]       Date: 08/07/2020 DOB: Jan 30, 1946  Age: 75 y.o. MRN#: [id number] Attending Physician: Thurnell Lose, MD Primary Care Physician: Christain Sacramento, MD Admit Date: 07/27/2020  Reason for Consultation/Follow-up: {Reason for Consult:23484}  Subjective: ***  Length of Stay: 11  Current Medications: Scheduled Meds:  . aspirin  81 mg Oral Daily  . busPIRone  5 mg Oral BID  . diltiazem  60 mg Oral Q6H  . enoxaparin (LOVENOX) injection  40 mg Subcutaneous Q24H  . feeding supplement  237 mL Oral TID BM  . folic acid  1 mg Oral Daily  . isosorbide mononitrate  30 mg Oral Daily  . levalbuterol  2 puff Inhalation BID  . mouth rinse  15 mL Mouth Rinse BID  . methylPREDNISolone (SOLU-MEDROL) injection  40 mg Intravenous BID  . mometasone-formoterol  2 puff Inhalation BID  . multivitamin with minerals  1 tablet Oral Daily  . pantoprazole  40 mg Oral Q1200  . thiamine  100 mg Oral Daily   Or  . thiamine  100 mg Intravenous Daily  . umeclidinium bromide  1 puff Inhalation Daily    Continuous Infusions:   PRN Meds: acetaminophen, albuterol, benzonatate, guaiFENesin-dextromethorphan, HYDROcodone-acetaminophen, lip balm, morphine injection, ondansetron (ZOFRAN) IV  Physical Exam          Vital Signs: BP 103/60 (BP Location: Right Arm)   Pulse 74   Temp 98.9 F (37.2 C) (Oral)   Resp 18   Ht _0  (1.575 m)   Wt 50.2 kg   SpO2 99%   BMI 20.24 kg/m  SpO2: SpO2: 99 % O2 Device: O2 Device: Nasal Cannula O2 Flow Rate: O2 Flow Rate (L/min): 5 L/min  Intake/output summary:   Intake/Output Summary (Last 24 hours) at 08/07/2020 1157 Last data filed at 08/07/2020 1124 Gross per 24 hour  Intake 420 ml  Output 900 ml  Net -480 ml    LBM: Last BM Date: 08/05/20 Baseline Weight: Weight: 49 kg Most recent weight: Weight: 50.2 kg       Palliative Assessment/Data:      Patient Active Problem List   Diagnosis Date Noted  . New onset a-fib ([place]) 07/27/2020  . COVID-19 virus infection 07/27/2020  . Chest pain, musculoskeletal 02/14/2020  .  Need for vaccination 10/05/2019  . Rheumatoid factor positive 09/19/2019  . 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 Protein-calorie malnutrition, severe 08/20/2019  . Severe sepsis ([place]) 08/19/2019  . Acute lower UTI 08/19/2019  . Pressure injury of skin 08/19/2019  . ARF (acute renal failure) ([place]) 08/18/2019  . Hyperkalemia 08/18/2019  . Transaminitis 08/18/2019  . Generalized weakness 08/18/2019  . Leukocytosis 08/18/2019  . Anemia 08/18/2019  . Acute renal failure (ARF) ([place]) 08/18/2019  . Chronic respiratory failure with hypoxia and hypercapnia ([place]) 06/02/2018  . COPD with acute exacerbation ([place]) 05/09/2018  . Hyponatremia 05/09/2018  . Acute urinary retention 05/09/2018  . Cystocele with prolapse 03/02/2018  . Cor pulmonale, chronic ([place]) 06/29/2017  . Chronic respiratory failure with hypoxia ([place]) 06/30/2014  . Pulmonary infiltrates 01/26/2013  . Multiple pulmonary nodules determined by computed tomography of lung 08/19/2012  . Cancer screening 05/12/2012  . COPD exacerbation ([place]) 05/12/2012  . Essential hypertension 03/28/2012  . COPD GOLD III 12/02/2011  . Chronic cough 12/02/2011  . Cigarette smoker 12/02/2011    Palliative Care Assessment & Plan   Patient Profile: ***  Assessment: ***  Recommendations/Plan:  ***  Goals of Care and Additional Recommendations:  Limitations on Scope of Treatment: {Recommended Scope and Preferences:21019}  Code Status:    Code Status Orders  (From admission, onward)         Start     Ordered   08/02/20 1924  Do not attempt resuscitation (DNR)  Continuous       Question Answer Comment  In the event  of cardiac or respiratory ARREST Do not call a "code blue"   In the event of cardiac or respiratory ARREST Do not perform Intubation, CPR, defibrillation or ACLS   In the event of cardiac or respiratory ARREST Use medication by any route, position, wound care, and other measures to relive pain and suffering. May use oxygen, suction and manual treatment of airway obstruction as needed for comfort.      08/02/20 1923        Code Status History    Date Active Date Inactive Code Status Order ID Comments User Context   07/27/2020 2310 08/02/2020 1923 Full Code [id number]  Elwyn Reach, MD ED   08/18/2019 2322 08/24/2019 2039 Full Code [id number]  Rhetta Mura, DO ED   05/09/2018 0211 05/20/2018 1706 Full Code [id number]  Vianne Bulls, MD ED   03/02/2018 1028 03/03/2018 1351 Full Code [id number]  Cheri Fowler, MD Inpatient   08/19/2012 2119 08/21/2012 1728 Full Code [id number]  Samuella Cota, MD Inpatient   Advance Care Planning Activity       Prognosis:   {Palliative Care Prognosis:23504}  Discharge Planning:  {Palliative dispostion:23505}  Care plan was discussed with ***  Thank you for allowing the Palliative Medicine Team to assist in the care of this patient.   Time In: *** Time Out: *** Total Time *** Prolonged Time Billed  {YES NO:22349}       Greater than 50%  of this time was spent counseling and coordinating care related to the above assessment and plan.  Lavena Bullion, NP  Please contact Palliative Medicine Team phone at [phone] for questions and concerns.

## 2020-08-07 NOTE — Progress Notes (Signed)
Patient was discharged to nursing home [facility]) by MD order; discharged instructions review and sent to facility via [facility] with care notes and prescription; IV DIC; facility was called and report was given to the nurse who is going to receive the patient; patient will be transported to facility via [place].

## 2020-08-07 NOTE — Discharge Summary (Signed)
[name] NAT:[id number] DOB: 1945-10-06 DOA: 07/27/2020  PCP: Christain Sacramento, MD  Admit date: 07/27/2020  Discharge date: 08/07/2020  Admitted From: Home   Disposition:  SNF   Recommendations for Outpatient Follow-up:   Follow up with PCP in 1-2 weeks  PCP Please obtain BMP/CBC, 2 view CXR in 1week,  (see Discharge instructions)   PCP Please follow up on the following pending results:    Home Health: None   Equipment/Devices: None  Consultations: Cards, Pall. Care Discharge Condition: Guarded   CODE STATUS: DNR   Diet Recommendation: Heart Healthy   Diet Order            Diet - low sodium heart healthy           Diet regular Room service appropriate? Yes; Fluid consistency: Thin  Diet effective now                  Chief Complaint  Patient presents with  . Shortness of Breath  . Chest Pain    Started two weeks ago with progression to severe difficulty breathing and "bad cp"     Brief history of present illness from the day of admission and additional interim summary    Patient is a 75 y.o. female with PMHx of EtOH use, anemia of chronic disease, eczematous dermatitis, COPD on 3 L of oxygen at home, pulmonary hypertension-who presented with worsening cough-shortness of breath-found to have A. fib with RVR.  Started on Cardizem and heparin infusion-subsequently admitted to the hospitalist service.  COVID-19 vaccinated status: Vaccinated but not boosted  Significant Events: 2/12>> Admit to [facility] for COPD exacerbation-A. fib RVR-COVID-19 positive  Significant studies: 2/11>>Chest x-ray: Right> left basilar opacities 2/11>> CT abdomen/pelvis: No acute abdominal/pelvic findings, diffuse/severe fatty infiltration of liver 2/12>> Leg [place] - No DVT 2/13 TTE - IMPRESSIONS  1. Left ventricular  ejection fraction, by estimation, is 55 to 60%. The left ventricle has normal function. The left ventricle has no regional wall motion abnormalities. Left ventricular diastolic parameters were normal.  2. Right ventricular systolic function is normal. The right ventricular size is mildly enlarged.  3. The mitral valve is normal in structure. No evidence of mitral valve regurgitation.  4. The aortic valve is tricuspid. Aortic valve regurgitation is not visualized. No aortic stenosis is present.  5. The inferior vena cava is dilated in size with >50% respiratory variability, suggesting right atrial pressure of 8 mmHg                                                                 Hospital Course   COPD exacerbation: Probably triggered by COVID-19 infection- she was treated with IV Solu-Medrol and doxycycline, with supportive care with inhalers and oxygen, she is now fairly close to her baseline and now  between 4 to 5 L of oxygen which she also uses at home, continue 4 to 5 L of oxygen as tolerated, goal of care here is providing her comfort.  Unfortunately she has advanced underlying COPD, already appears extremely frail and cachectic, long-term prognosis does not look good.  She at this point wants to go to SNF on 08/07/2020, she realizes that her prognosis is very poor and will consider transitioning to hospice if there is further decline, she remains DNR, I have clearly explained to her multiple times that her prognosis is extremely poor.  ?COVID-19 PNA: Possible pneumonic changes in x-ray chest-CRP elevated-however LFTs are significantly elevated.  Was treated with steroids and Remdesivir course she has finished this treatment.  SpO2: 99 % O2 Flow Rate (L/min): 5 L/min FiO2 (%): 30 %   PAF with RVR: She is very frail-lives alone-has history of alcohol and smoking abuse along with anemia, she is a very poor candidate for anticoagulation, she was seen by cardiology who recommended Cardizem and aspirin  combination which I agree with.  Echo was noted.  Elevated D-dimer. -ve leg ultrasound, on prophylactic Lovenox, monitor, poor candidate for anticoagulation.    Acute alcoholic hepatitis: LFTs significantly elevated-but seems to be slowly downtrending-discriminant function only 5.  No role for steroids-CT abdomen with significant fatty infiltration.  Supportive care-avoid hepatotoxic medications.  Follow LFTs  EtOH withdrawal: Counseled to quit, no signs of DTs here.  Normocytic anemia: Anemia panel suggestive of anemia of chronic disease, supportive care.  History of pulmonary hypertension: Due to COPD-outpatient follow-up with pulmonary.  Continue oxygen supplementation she uses 4 to 5 L of oxygen at all times continue.  COPD appears extremely advanced and terminal.  Deconditioning/debility/frailty: SNF once stable.  Depression: Hold bupropion-allow LFTs to improve further before initiating.  Eczematous dermatitis-keratotic lesions in left breast/bilateral lower extremities - outpatient dermatology follow-up.  Discharge diagnosis     Principal Problem:   New onset a-fib [facility]) Active Problems:   Essential hypertension   Chronic respiratory failure with hypoxia (HCC)   COPD with acute exacerbation (HCC)   Protein-calorie malnutrition, severe   COVID-19 virus infection    Discharge instructions    Discharge Instructions    Diet - low sodium heart healthy   Complete by: As directed    Discharge instructions   Complete by: As directed    Follow with Primary MD Christain Sacramento, MD in 7 days   Get CBC, CMP, 2 view Chest X ray -  checked next visit within 1 week by Primary MD or SNF MD.  Activity: As tolerated with Full fall precautions use walker/cane & assistance as needed  Disposition SNF  Diet: Heart Healthy   Special Instructions: If you have smoked or chewed Tobacco  in the last 2 yrs please stop smoking, stop any regular Alcohol  and or any Recreational drug  use.  On your next visit with your primary care physician please Get Medicines reviewed and adjusted.  Please request your Prim.MD to go over all Hospital Tests and Procedure/Radiological results at the follow up, please get all Hospital records sent to your Prim MD by signing hospital release before you go home.  If you experience worsening of your admission symptoms, develop shortness of breath, life threatening emergency, suicidal or homicidal thoughts you must seek medical attention immediately by calling 911 or calling your MD immediately  if symptoms less severe.  You Must read complete instructions/literature along with all the possible adverse reactions/side effects for all  the Medicines you take and that have been prescribed to you. Take any new Medicines after you have completely understood and accpet all the possible adverse reactions/side effects.   No wound care   Complete by: As directed       Discharge Medications   Allergies as of 08/07/2020      Reactions   Tramadol Other (See Comments)   GI UPSET      Medication List    STOP taking these medications   azithromycin 250 MG tablet Commonly known as: ZITHROMAX   formoterol 20 MCG/2ML nebulizer solution Commonly known as: PERFOROMIST   levofloxacin 500 MG tablet Commonly known as: LEVAQUIN   lidocaine 5 % Commonly known as: Lidoderm   losartan 25 MG tablet Commonly known as: COZAAR   predniSONE 10 MG tablet Commonly known as: DELTASONE     TAKE these medications   albuterol 108 (90 Base) MCG/ACT inhaler Commonly known as: VENTOLIN HFA Inhale 2 puffs into the lungs every 4 (four) hours as needed for wheezing or shortness of breath.   albuterol (2.5 MG/3ML) 0.083% nebulizer solution Commonly known as: PROVENTIL Take 3 mLs (2.5 mg total) by nebulization every 6 (six) hours as needed for wheezing or shortness of breath.   arformoterol 15 MCG/2ML Nebu Commonly known as: BROVANA Take 2 mLs (15 mcg total)  by nebulization 2 (two) times daily.   aspirin 81 MG chewable tablet Chew 1 tablet (81 mg total) by mouth daily.   B-complex with vitamin C tablet Take 1 tablet by mouth daily.   budesonide 0.5 MG/2ML nebulizer solution Commonly known as: Pulmicort Take 2 mLs (0.5 mg total) by nebulization in the morning and at bedtime. DX: J44.9   buPROPion 150 MG 12 hr tablet Commonly known as: WELLBUTRIN SR Take 150 mg by mouth daily.   CAL MAG ZINC +D3 PO Take 1 tablet by mouth at bedtime.   diltiazem 360 MG 24 hr capsule Commonly known as: CARDIZEM CD Take 360 mg by mouth daily.   Flutter Devi Use as directed   furosemide 20 MG tablet Commonly known as: LASIX Take 20 mg by mouth daily.   Garlic 671 MG Tabs Take 1 tablet by mouth daily.   guaiFENesin-dextromethorphan 100-10 MG/5ML syrup Commonly known as: ROBITUSSIN DM Take 10 mLs by mouth every 4 (four) hours as needed for cough.   HYDROcodone-acetaminophen 7.5-325 MG tablet Commonly known as: NORCO Take 1 tablet by mouth every 6 (six) hours as needed for moderate pain.   hydrOXYzine 25 MG tablet Commonly known as: ATARAX/VISTARIL Take 12.5-25 mg by mouth 3 (three) times daily as needed for nausea.   isosorbide mononitrate 30 MG 24 hr tablet Commonly known as: IMDUR Take 1 tablet (30 mg total) by mouth daily.   ondansetron 4 MG tablet Commonly known as: ZOFRAN Take 2-4 mg by mouth every 6 (six) hours as needed for nausea/vomiting.   OXYGEN Inhale 4 L into the lungs. 24/7 DME- [employer]   pantoprazole 40 MG tablet Commonly known as: PROTONIX Take 1 tablet (40 mg total) by mouth daily at 12 noon.   potassium chloride 10 MEQ tablet Commonly known as: KLOR-CON Take 10 mEq by mouth daily. When takes lasix   revefenacin 175 MCG/3ML nebulizer solution Commonly known as: YUPELRI Take 3 mLs (175 mcg total) by nebulization daily. DX: J44.9   spironolactone 25 MG tablet Commonly known as: ALDACTONE Take 1 tablet by mouth  2 (two) times daily.   triamcinolone 0.1 % Commonly known as: KENALOG  Apply 1 application topically daily as needed (skin irritation on legs).        Follow-up Information    Christain Sacramento, MD. Schedule an appointment as soon as possible for a visit in 1 week(s).   Specialty: Family Medicine Contact information: [address] [place] [address] [phone]               Major procedures and Radiology Reports - PLEASE review detailed and final reports thoroughly  -        CT ABDOMEN PELVIS W CONTRAST  Result Date: 07/27/2020 CLINICAL DATA:  Elevated liver function studies. EXAM: CT ABDOMEN AND PELVIS WITH CONTRAST TECHNIQUE: Multidetector CT imaging of the abdomen and pelvis was performed using the standard protocol following bolus administration of intravenous contrast. CONTRAST:  60m OMNIPAQUE IOHEXOL 300 MG/ML  SOLN COMPARISON:  None. FINDINGS: Lower chest: Moderate breathing motion artifact. There are emphysematous changes and pulmonary scarring but no definite infiltrates or effusions. The heart is within normal limits in size. Moderate aortic calcifications. Hepatobiliary: Diffuse and severe fatty infiltration of the liver with focal areas of more pronounced fatty change. No worrisome hepatic lesions or intrahepatic biliary dilatation. The gallbladder is unremarkable. No common bile duct dilatation. Pancreas: Advanced pancreatic atrophy but no mass, inflammation or ductal dilatation. Spleen: Normal size.  No focal lesions. Adrenals/Urinary Tract: Mild nodularity of both adrenal glands but no worrisome lesions. Scattered low-attenuation renal lesions are likely benign cysts. No worrisome renal lesions or hydronephrosis. 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 The bladder is unremarkable. Stomach/Bowel: The stomach, duodenum, small bowel and colon are grossly normal. Vascular/Lymphatic: Advanced atherosclerotic calcifications involving the  aorta and branch vessels but no aneurysm. The major venous structures are patent. No mesenteric or retroperitoneal mass or adenopathy. Small scattered lymph nodes are noted. Reproductive: The uterus and ovaries are unremarkable. Other: No pelvic mass or adenopathy. No free pelvic fluid collections. No inguinal mass or adenopathy. No abdominal wall hernia or subcutaneous lesions. Musculoskeletal: No significant bony findings. IMPRESSION: 1. Diffuse and severe fatty infiltration of the liver. 2. No acute abdominal/pelvic findings, mass lesions or adenopathy. 3. Advanced atherosclerotic calcifications involving the aorta and branch vessels. 4. 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 5. Emphysematous changes and pulmonary scarring at the lung bases. 6. Emphysema and aortic atherosclerosis. Aortic Atherosclerosis (ICD10-I70.0) and Emphysema (ICD10-J43.9). Electronically Signed   By: PMarijo SanesM.D.   On: 07/27/2020 19:46   DG Chest Port 1 View  Result Date: 08/01/2020 CLINICAL DATA:  Shortness of breath.  COVID. EXAM: PORTABLE CHEST 1 VIEW COMPARISON:  CT 01/15/2020.  Chest x-ray 08/18/2019, 05/11/2018. FINDINGS: Mediastinum and hilar structures are normal. COPD. Chronic bilateral interstitial changes are noted. Bibasilar subsegmental atelectasis and or scarring again noted. No acute infiltrate noted. Previously noted pulmonary nodules on CT of 01/16/2020 best evaluated by prior CT. No pleural effusion or pneumothorax. IMPRESSION: 1. COPD. Chronic bilateral interstitial changes and bibasilar subsegmental atelectasis and or scarring. 2. Previously noted pulmonary nodules noted on CT of 01/16/2020 best evaluated by CT. Reference is made to that report. Electronically Signed   By: TMarcello Moores Register   On: 08/01/2020 08:21   DG Chest Portable 1 View  Result Date: 07/27/2020 CLINICAL DATA:  SOB EXAM: PORTABLE CHEST 1 VIEW COMPARISON:  01/16/2020 and prior. FINDINGS: Emphysematous  changes. Patchy right predominant basilar opacities. No pneumothorax or pleural effusion. Cardiomediastinal silhouette within normal limits. IMPRESSION: Right predominant basilar opacities,  atelectasis versus infiltrate. Emphysema. Electronically Signed   By: Primitivo Gauze M.D.   On: 07/27/2020 15:25   VAS [place] LOWER EXTREMITY VENOUS (DVT)  Result Date: 07/30/2020  Lower Venous DVT Study Indications: Covid, d-dimer.  Anticoagulation: Lovenox. Limitations: Poor ultrasound/tissue interface due to superficial skin changes. Comparison Study: 03-20-2020 Prior LT lower extremity venous was negative for                   DVT. Performing Technologist: Darlin Coco RDMS  Examination Guidelines: A complete evaluation includes B-mode imaging, spectral Doppler, color Doppler, and power Doppler as needed of all accessible portions of each vessel. Bilateral testing is considered an integral part of a complete examination. Limited examinations for reoccurring indications may be performed as noted. The reflux portion of the exam is performed with the patient in reverse Trendelenburg.  +---------+---------------+---------+-----------+----------+-------------------+ RIGHT    CompressibilityPhasicitySpontaneityPropertiesThrombus Aging      +---------+---------------+---------+-----------+----------+-------------------+ CFV      Full           Yes      Yes                                      +---------+---------------+---------+-----------+----------+-------------------+ SFJ      Full                                                             +---------+---------------+---------+-----------+----------+-------------------+ FV Prox  Full                                                             +---------+---------------+---------+-----------+----------+-------------------+ FV Mid   Full                                                              +---------+---------------+---------+-----------+----------+-------------------+ FV DistalFull                                                             +---------+---------------+---------+-----------+----------+-------------------+ PFV      Full                                                             +---------+---------------+---------+-----------+----------+-------------------+ POP      Full           Yes      Yes                                      +---------+---------------+---------+-----------+----------+-------------------+  PTV      Full                                         Some segments not                                                         well visualized     +---------+---------------+---------+-----------+----------+-------------------+ PERO     Full                                         Some segments not                                                         well visualized     +---------+---------------+---------+-----------+----------+-------------------+   +---------+---------------+---------+-----------+----------+-------------------+ LEFT     CompressibilityPhasicitySpontaneityPropertiesThrombus Aging      +---------+---------------+---------+-----------+----------+-------------------+ CFV      Full           Yes      Yes                                      +---------+---------------+---------+-----------+----------+-------------------+ SFJ      Full                                                             +---------+---------------+---------+-----------+----------+-------------------+ FV Prox  Full                                                             +---------+---------------+---------+-----------+----------+-------------------+ FV Mid   Full                                                             +---------+---------------+---------+-----------+----------+-------------------+ FV  DistalFull                                                             +---------+---------------+---------+-----------+----------+-------------------+ PFV      Full                                                             +---------+---------------+---------+-----------+----------+-------------------+  POP      Full           Yes      Yes                                      +---------+---------------+---------+-----------+----------+-------------------+ PTV      Full                                         Some segments not                                                         well visualized     +---------+---------------+---------+-----------+----------+-------------------+ PERO     Full                                         Some segments not                                                         well visualized     +---------+---------------+---------+-----------+----------+-------------------+     Summary: RIGHT: - There is no evidence of deep vein thrombosis in the lower extremity. However, portions of this examination were limited- see technologist comments above.  - No cystic structure found in the popliteal fossa.  LEFT: - There is no evidence of deep vein thrombosis in the lower extremity. However, portions of this examination were limited- see technologist comments above.  - No cystic structure found in the popliteal fossa.  *See table(s) above for measurements and observations. Electronically signed by Ruta Hinds MD on 07/30/2020 at 4:19:16 PM.    Final    ECHOCARDIOGRAM LIMITED  Result Date: 07/28/2020    ECHOCARDIOGRAM LIMITED REPORT   Patient Name:   [name] [facility] Date of Exam: 07/28/2020 Medical Rec #:  [id number]       Height:       62.0 in Accession #:    [id number]      Weight:       108.0 lb Date of Birth:  11-25-1945       BSA:          1.471 m Patient Age:    75 years        BP:           114/78 mmHg Patient Gender: F                HR:           83 bpm. Exam Location:  Inpatient Procedure: Limited Echo, Limited Color Doppler and Cardiac Doppler Indications:    atrial fibrillation  History:        Patient has no prior history of Echocardiogram examinations.                 Covid and COPD; Risk Factors:Hypertension.  Sonographer:    Johny Chess Referring  Phys: [place]  1. Left ventricular ejection fraction, by estimation, is 55 to 60%. The left ventricle has normal function. The left ventricle has no regional wall motion abnormalities. Left ventricular diastolic parameters were normal.  2. Right ventricular systolic function is normal. The right ventricular size is mildly enlarged.  3. The mitral valve is normal in structure. No evidence of mitral valve regurgitation.  4. The aortic valve is tricuspid. Aortic valve regurgitation is not visualized. No aortic stenosis is present.  5. The inferior vena cava is dilated in size with >50% respiratory variability, suggesting right atrial pressure of 8 mmHg. Comparison(s): No prior Echocardiogram. FINDINGS  Left Ventricle: Left ventricular ejection fraction, by estimation, is 55 to 60%. The left ventricle has normal function. The left ventricle has no regional wall motion abnormalities. The left ventricular internal cavity size was normal in size. There is  no left ventricular hypertrophy. Left ventricular diastolic parameters were normal. Right Ventricle: The right ventricular size is mildly enlarged. Right ventricular systolic function is normal. Mitral Valve: The mitral valve is normal in structure. Tricuspid Valve: The tricuspid valve is normal in structure. Tricuspid valve regurgitation is not demonstrated. Aortic Valve: The aortic valve is tricuspid. Aortic valve regurgitation is not visualized. No aortic stenosis is present. Aorta: The aortic root is normal in size and structure and the ascending aorta was not well visualized. Venous: The inferior vena cava is  dilated in size with greater than 50% respiratory variability, suggesting right atrial pressure of 8 mmHg. LEFT VENTRICLE PLAX 2D LVIDd:         4.40 cm  Diastology LVIDs:         2.50 cm  LV e' medial:    10.60 cm/s LV PW:         0.80 cm  LV E/e' medial:  7.9 LV IVS:        0.70 cm  LV e' lateral:   11.50 cm/s LVOT diam:     1.90 cm  LV E/e' lateral: 7.3 LV SV:         66 LV SV Index:   45 LVOT Area:     2.84 cm  IVC IVC diam: 2.40 cm LEFT ATRIUM         Index LA diam:    3.70 cm 2.51 cm/m  AORTIC VALVE LVOT Vmax:   137.00 cm/s LVOT Vmean:  87.500 cm/s LVOT VTI:    0.233 m  AORTA Ao Root diam: 2.90 cm MITRAL VALVE MV Area (PHT): 4.10 cm    SHUNTS MV Decel Time: 185 msec    Systemic VTI:  0.23 m MV E velocity: 83.60 cm/s  Systemic Diam: 1.90 cm MV A velocity: 71.10 cm/s MV E/A ratio:  1.18 Rudean Haskell MD Electronically signed by Rudean Haskell MD Signature Date/Time: 07/28/2020/5:07:15 PM    Final     Micro Results    No results found for this or any previous visit (from the past 240 hour(s)).  Today   Subjective    [name] today has no headache,no chest abdominal pain,no new weakness tingling or numbness, feels much better     Objective   Blood pressure 103/60, pulse 74, temperature 98.9 F (37.2 C), temperature source Oral, resp. rate 18, height _0  (1.575 m), weight 50.2 kg, SpO2 99 %.   Intake/Output Summary (Last 24 hours) at 08/07/2020 0906 Last data filed at 08/07/2020 0703 Gross per 24 hour  Intake 240 ml  Output 1100 ml  Net -860 ml    Exam  Extremely frail, cachectic elderly white female, in no acute distress, no focal deficits [place].AT,PERRAL Supple Neck,No JVD, No cervical lymphadenopathy appriciated.  Symmetrical Chest wall movement, Good air movement bilaterally, CTAB RRR,No Gallops, Rubs or new Murmurs, No Parasternal Heave +ve B.Sounds, Abd Soft, No tenderness, No organomegaly appriciated, No rebound - guarding or rigidity. Chronic diffuse  bruising from previous lab draws/fals, frail capillaries, chronic scaly rash both legs & L.chest wall    Data Review   CBC w Diff:  Lab Results  Component Value Date   WBC 10.6 (H) 08/03/2020   HGB 8.3 (L) 08/03/2020   HCT 25.9 (L) 08/03/2020   PLT 136 (L) 08/03/2020   LYMPHOPCT 23 07/27/2020   MONOPCT 19 07/27/2020   EOSPCT 0 07/27/2020   BASOPCT 1 07/27/2020    CMP:  Lab Results  Component Value Date   NA 138 08/03/2020   K 3.9 08/03/2020   CL 88 (L) 08/03/2020   CO2 38 (H) 08/03/2020   BUN 30 (H) 08/03/2020   CREATININE 0.72 08/03/2020   PROT 4.9 (L) 08/03/2020   ALBUMIN 2.6 (L) 08/03/2020   BILITOT 0.3 08/03/2020   ALKPHOS 86 08/03/2020   AST 46 (H) 08/03/2020   ALT 91 (H) 08/03/2020  .   Total Time in preparing paper work, data evaluation and todays exam - 45 minutes  Lala Lund M.D on 08/07/2020 at 9:06 AM  [facility]

## 2020-08-07 NOTE — TOC Transition Note (Signed)
Transition of Care [facility]) - CM/SW Discharge Note   Patient Details  Name: [name] MRN: [id number] Date of Birth: September 26, 1945  Transition of Care [facility]) CM/SW Contact:  Tresa Endo Phone Number: 08/07/2020, 9:34 AM   Clinical Narrative:    Patient will DC to: [facility] Anticipated DC date: 08/07/20 Family notified: Becky Sax (Friend) Transport by: Corey Harold   Per MD patient ready for DC to [facility] . RN to call report prior to discharge [phone]) [phone]. RN, patient, patient's family, and facility notified of DC. Discharge Summary and FL2 sent to facility. DC packet on chart. Ambulance transport requested for patient.   CSW will sign off for now as social work intervention is no longer needed. Please consult [place] again if new needs arise.       Final next level of care: Skilled Nursing Facility Barriers to Discharge: Barriers Resolved   Patient Goals and CMS Choice Patient states their goals for this hospitalization and ongoing recovery are:: Rehab CMS Medicare.gov Compare Post Acute Care list provided to:: Patient Choice offered to / list presented to : Patient  Discharge Placement PASRR number recieved: 08/07/20            Patient chooses bed at: [facility] Patient to be transferred to facility by: [facility] Name of family member notified: Sonja Patient and family notified of of transfer: 08/07/20  Discharge Plan and Services                                     Social Determinants of Health (SDOH) Interventions     Readmission Risk Interventions No flowsheet data found.

## 2020-08-08 ENCOUNTER — Non-Acute Institutional Stay (SKILLED_NURSING_FACILITY): Payer: Medicare Other | Admitting: Adult Health

## 2020-08-08 ENCOUNTER — Encounter: Payer: Self-pay | Admitting: Adult Health

## 2020-08-08 DIAGNOSIS — I4891 Unspecified atrial fibrillation: Secondary | ICD-10-CM

## 2020-08-08 DIAGNOSIS — F339 Major depressive disorder, recurrent, unspecified: Secondary | ICD-10-CM

## 2020-08-08 DIAGNOSIS — J9611 Chronic respiratory failure with hypoxia: Secondary | ICD-10-CM

## 2020-08-08 DIAGNOSIS — U071 COVID-19: Secondary | ICD-10-CM

## 2020-08-08 DIAGNOSIS — K7010 Alcoholic hepatitis without ascites: Secondary | ICD-10-CM

## 2020-08-08 DIAGNOSIS — I270 Primary pulmonary hypertension: Secondary | ICD-10-CM

## 2020-08-08 DIAGNOSIS — J441 Chronic obstructive pulmonary disease with (acute) exacerbation: Secondary | ICD-10-CM | POA: Diagnosis not present

## 2020-08-08 NOTE — Progress Notes (Addendum)
Location:  [facility] Room Number: 007/M Place of Service:  SNF (31) Provider:  Durenda Age, DNP, FNP-BC  Patient Care Team: Christain [place], MD as PCP - General (Family Medicine) [facility] [place] (Inactive) (Dermatology)  Extended Emergency Contact Information Primary Emergency Contact: Dala Dock Mobile Phone: [phone] Relation: Friend Secondary Emergency Contact: [place] Phone: [phone] Relation: Friend  Code Status:  DNR  Goals of care: Advanced Directive information Advanced Directives 08/08/2020  Does Patient Have a Medical Advance Directive? No;Yes  Type of Advance Directive Out of facility DNR (pink MOST or yellow form)  Does patient want to make changes to medical advance directive? No - Patient declined  Would patient like information on creating a medical advance directive? -     Chief Complaint  Patient presents with  . Hospitalization Follow-up    Hospitalization Follow Up     HPI:  Pt is a 75 y.o. female who was admitted to [facility] on 08/07/20 post [facility] hospitalization 07/27/20 to 08/07/20. She has a PMH of EtOH use, anemia of chronic disease, eczematous dermatitis, COPD on 3L O2 at home and pulmonary hypertension. She presented with worsening cough, shortness of breath and was found to have afib with RVR. She was started on Cardizem and Heparin infusion. She is a poor candidate for anticoagulation. She was seen by cardiology who recommended Cardizem and Aspirin. D-dimer was elevated (3.14, 07/31/20) and  Leg ultrasound was negative forDVT. She was put on prophylactic Lovenox while in the hospital since she is a poor candidate for anticoagulation. She has COPD exacerbation which was probably triggered by COVID-19 infection.  Chest x-ray showed right> left basilar opacities. She was treated with IV Solu-Medrol and doxycycline, inhalers and oxygen at 4  to5 L. She has advanced COPD, extremely frail and cachectic, long-term prognosis is very poor.  She has been that her prognosis is very poor and will consider transitioning to hospice if there is further decline.  She is currently DNR. COVID-19 was treated with steroids and remdesivir.  CT of the abdomen/pelvis done 2/11 showed no acute abdominal/pelvic findings, diffuse/severe fatty infiltration of the liver.  Her LFTs were significantly elevated but seems to be slowly downtrending. Bupropion 150 mg daily will be held due elevated LFTs.  She was seen in her room today. Current diet is heart healthy diet, low sodium, she requested for a regular diet. She refused to eat her lunch today.  Past Medical History:  Diagnosis Date  . Abrasion    under left knee x 2 places changing dressing q day of bid, applying ssd cream area healing due to fell 2 weeks ago  . Blood dyscrasia    BLEEDS EASY  . Bruises easily    bleeds easily  . Closed patellar sleeve fracture of left knee 01/04/2018  . Closed patellar sleeve fracture of right knee 01/04/2018   healing   . COPD (chronic obstructive pulmonary disease) ([place])   . Coronary artery disease    per dr Philis Kendall note 01-01-18 note  . Hernia, inguinal    LEFT  . Hypertension    states under control with meds., has been on med. > 10 yr.  . On home oxygen therapy    at night 2L/min  . Pneumonia    "SEVERAL TIMES IN PAST, LAST TIME 2018"  . Pulmonary hypertension ([place])    per dr Terrence Dupont 7-19 19 lov note  . Shortness of breath dyspnea  with exertion  . SVD (spontaneous vaginal delivery)    x 1  . Thin skin   . Urinary tract infection 2019  . Wears partial dentures    lower partial and upper plate   Past Surgical History:  Procedure Laterality Date  . COLONOSCOPY     Less than 10 years ago per patient   . CYSTOCELE REPAIR N/A 03/02/2018   Procedure: ANTERIOR REPAIR (CYSTOCELE);  Surgeon: Cheri Fowler, MD;  Location: [facility];  Service:  Gynecology;  Laterality: N/A;  OUTPATIENT IN BED  . MULTIPLE TOOTH EXTRACTIONS    . RESECTION DISTAL CLAVICAL Left 10/12/2014   Procedure: DISTAL CLAVICLE EXCISION;  Surgeon: Kathryne Hitch, MD;  Location: [facility];  Service: Orthopedics;  Laterality: Left;  . SHOULDER ARTHROSCOPY WITH ROTATOR CUFF REPAIR AND SUBACROMIAL DECOMPRESSION Left 10/12/2014   Procedure: LEFT SHOULDER ARTHROSCOPY, DEBRIDEMENT WITH ACROMIOPLASTY,  ROTATOR CUFF REPAIR AND RELEASE BICEPS TENDON;  Surgeon: Kathryne Hitch, MD;  Location: [facility];  Service: Orthopedics;  Laterality: Left;    Allergies  Allergen Reactions  . Tramadol Other (See Comments)    GI UPSET    Outpatient Encounter Medications as of 08/08/2020  Medication Sig  . albuterol (PROVENTIL) (2.5 MG/3ML) 0.083% nebulizer solution Take 3 mLs (2.5 mg total) by nebulization every 6 (six) hours as needed for wheezing or shortness of breath.  Marland Kitchen albuterol (VENTOLIN HFA) 108 (90 Base) MCG/ACT inhaler Inhale 2 puffs into the lungs every 4 (four) hours as needed for wheezing or shortness of breath.  Marland Kitchen aspirin 81 MG chewable tablet Chew 1 tablet (81 mg total) by mouth daily.  . B Complex-C (B-COMPLEX WITH VITAMIN C) tablet Take 1 tablet by mouth daily.  . budesonide (PULMICORT) 0.5 MG/2ML nebulizer solution Take 2 mLs (0.5 mg total) by nebulization in the morning and at bedtime. DX: J44.9  . buPROPion (WELLBUTRIN SR) 150 MG 12 hr tablet Take 150 mg by mouth daily.   Marland Kitchen diltiazem (CARDIZEM CD) 360 MG 24 hr capsule Take 360 mg by mouth daily.   . Emollient (AQUAPHOR EX) Apply topically. Apply to lower ext twice a day (avoid heels due to wounds / dressing)  . furosemide (LASIX) 20 MG tablet Take 20 mg by mouth daily.  Marland Kitchen guaiFENesin-dextromethorphan (ROBITUSSIN DM) 100-10 MG/5ML syrup Take 10 mLs by mouth every 4 (four) hours as needed for cough.  Marland Kitchen HYDROcodone-acetaminophen (NORCO) 7.5-325 MG tablet Take 1 tablet by mouth every 6 (six)  hours as needed for moderate pain.  . hydrOXYzine (ATARAX/VISTARIL) 25 MG tablet Take 12.5 mg by mouth every 8 (eight) hours as needed for nausea.  . isosorbide mononitrate (IMDUR) 30 MG 24 hr tablet Take 1 tablet (30 mg total) by mouth daily.  . Multiple Minerals-Vitamins (CAL MAG ZINC +D3 PO) Take 1 tablet by mouth at bedtime. For supplement  . ondansetron (ZOFRAN) 4 MG tablet Take 4 mg by mouth every 6 (six) hours as needed for nausea/vomiting.  . OXYGEN Inhale 4-5 L into the lungs continuous. TO MAINTAIN O2 SATURATION OF >90% for Hypoxia  . pantoprazole (PROTONIX) 40 MG tablet Take 1 tablet (40 mg total) by mouth daily at 12 noon.  . potassium chloride (K-DUR) 10 MEQ tablet Take 10 mEq by mouth daily. When takes lasix  . Respiratory Therapy Supplies (FLUTTER) DEVI Use as directed  . revefenacin (YUPELRI) 175 MCG/3ML nebulizer solution Take 3 mLs (175 mcg total) by nebulization daily. DX: J44.9  . salmeterol (SEREVENT) 50 MCG/DOSE diskus inhaler Inhale 1  puff into the lungs 2 (two) times daily.  Marland Kitchen spironolactone (ALDACTONE) 25 MG tablet Take 1 tablet by mouth 2 (two) times daily.  Marland Kitchen triamcinolone (KENALOG) 0.1 % Apply 1 application topically daily as needed (skin irritation on legs).  . [DISCONTINUED] arformoterol (BROVANA) 15 MCG/2ML NEBU Take 2 mLs (15 mcg total) by nebulization 2 (two) times daily.  . [DISCONTINUED] Garlic 267 MG TABS Take 1 tablet by mouth daily.  . [DISCONTINUED] Multiple Minerals-Vitamins (CAL MAG ZINC +D3 PO) Take 1 tablet by mouth at bedtime.   No facility-administered encounter medications on file as of 08/08/2020.    Review of Systems  GENERAL: No fever or chills MOUTH and THROAT: Denies oral discomfort, gingival pain or bleeding RESPIRATORY: +coughing CARDIAC: No chest pain, edema or palpitations GI: No abdominal pain, diarrhea, constipation, heart burn, nausea or vomiting GU: Denies dysuria, frequency, hematuria, incontinence, or discharge NEUROLOGICAL:  Denies dizziness, syncope, numbness, or headache PSYCHIATRIC: Denies feelings of depression or anxiety. No report of hallucinations, insomnia, paranoia, or agitation    Immunization History  Administered Date(s) Administered  . Fluad Quad(high Dose 65+) 04/18/2019  . Influenza Split 03/17/2011, 03/30/2013, 04/03/2014, 02/14/2015  . Influenza Whole 03/17/2012, 03/09/2016  . Influenza, High Dose Seasonal PF 03/10/2017, 03/15/2018, 04/18/2019, 02/28/2020  . PFIZER(Purple Top)SARS-COV-2 Vaccination 11/08/2019, 12/05/2019  . Pneumococcal Conjugate-13 01/16/2014, 04/18/2019  . Pneumococcal Polysaccharide-23 06/16/2008  . Tdap 06/16/2005, 07/15/2016   Pertinent  Health Maintenance Due  Topic Date Due  . COLONOSCOPY (Pts 45-38yr Insurance coverage will need to be confirmed)  Never done  . MAMMOGRAM  Never done  . DEXA SCAN  Never done  . PNA vac Low Risk Adult (2 of 2 - PPSV23) 04/17/2020  . INFLUENZA VACCINE  Completed   No flowsheet data found.   Vitals:   08/08/20 1047  BP: 118/70  Pulse: 86  Resp: (!) 24  Temp: (!) 97.1 F (36.2 C)  SpO2: 96%  Weight: 110 lb 10.7 oz (50.2 kg)  Height: _0  (1.575 m)   Body mass index is 20.24 kg/m.  Physical Exam  GENERAL APPEARANCE:  In no acute distress.  SKIN:  Rough dry skin on bilateral lower legs, pressure ulcer to left heel, dark bluish bruising on left shoulder and left upper chest MOUTH and THROAT: Lips are without lesions. Oral mucosa is moist and without lesions. Tongue is normal in shape, size, and color and without lesions RESPIRATORY: decreased breath sounds, on O2 _1 /min CARDIAC: RRR, no murmur,no extra heart sounds, no edema GI: Abdomen soft, normal BS, no masses, no tenderness EXTREMITIES:  Able to move X 4 extremities NEUROLOGICAL: There is no tremor. Speech is clear. Alert and oriented X 3. PSYCHIATRIC:  Affect and behavior are appropriate  Labs reviewed: Recent Labs    08/19/19 0239 08/19/19 1520  08/20/19 0222 07/28/20 0216 07/28/20 0743 08/01/20 0204 08/02/20 0045 08/03/20 0119  NA 137 142   < > 141   < > 137 137 138  K 5.0 4.5   < > 4.3   < > 3.9 3.7 3.9  CL 101 104   < > 93*   < > 93* 89* 88*  CO2 21* 26   < > 28   < > 37* 39* 38*  GLUCOSE 102* 117*   < > 215*   < > 200* 224* 229*  BUN 86* 73*   < > 10   < > 19 27* 30*  CREATININE 2.98* 1.78*   < > 0.92   < >  0.47 0.90 0.72  CALCIUM 8.5* 8.2*   < > 7.9*   < > 8.5* 8.6* 8.9  MG 1.6* 2.6*   < > 1.5*   < > 1.9 2.1 2.0  PHOS 4.5 2.9  --  3.4  --   --   --   --    < > = values in this interval not displayed.   Recent Labs    08/01/20 0204 08/02/20 0045 08/03/20 0119  AST 51* 50* 46*  ALT 105* 104* 91*  ALKPHOS 101 99 86  BILITOT 0.7 0.6 0.3  PROT 4.6* 5.0* 4.9*  ALBUMIN 2.4* 2.6* 2.6*   Recent Labs    08/18/19 1919 08/18/19 2200 08/19/19 0239 08/19/19 1520 07/27/20 1515 07/28/20 0216 08/01/20 0204 08/02/20 0045 08/03/20 0119  WBC 15.6*   < > 14.5*   < > 8.2   < > 9.2 11.0* 10.6*  NEUTROABS 12.1*  --  11.8*  --  4.2  --   --   --   --   HGB 9.4*   < > 8.6*   < > 8.6*   < > 8.2* 8.5* 8.3*  HCT 29.0*   < > 26.7*   < > 28.1*   < > 26.1* 26.6* 25.9*  MCV 102.8*   < > 100.4*   < > 101.8*   < > 99.6 98.5 99.2  PLT PLATELET CLUMPS NOTED ON SMEAR, COUNT APPEARS DECREASED   < > 171   < > 119*   < > 111* 124* 136*   < > = values in this interval not displayed.   Lab Results  Component Value Date   TSH 0.953 07/28/2020   Lab Results  Component Value Date   HGBA1C 6.1 (H) 05/13/2018   Lab Results  Component Value Date   CHOL 136 07/28/2020   HDL 83 07/28/2020   LDLCALC 43 07/28/2020   TRIG 48 07/28/2020   CHOLHDL 1.6 07/28/2020    Significant Diagnostic Results in last 30 days:  CT ABDOMEN PELVIS W CONTRAST  Result Date: 07/27/2020 CLINICAL DATA:  Elevated liver function studies. EXAM: CT ABDOMEN AND PELVIS WITH CONTRAST TECHNIQUE: Multidetector CT imaging of the abdomen and pelvis was performed using  the standard protocol following bolus administration of intravenous contrast. CONTRAST:  10m OMNIPAQUE IOHEXOL 300 MG/ML  SOLN COMPARISON:  None. FINDINGS: Lower chest: Moderate breathing motion artifact. There are emphysematous changes and pulmonary scarring but no definite infiltrates or effusions. The heart is within normal limits in size. Moderate aortic calcifications. Hepatobiliary: Diffuse and severe fatty infiltration of the liver with focal areas of more pronounced fatty change. No worrisome hepatic lesions or intrahepatic biliary dilatation. The gallbladder is unremarkable. No common bile duct dilatation. Pancreas: Advanced pancreatic atrophy but no mass, inflammation or ductal dilatation. Spleen: Normal size.  No focal lesions. Adrenals/Urinary Tract: Mild nodularity of both adrenal glands but no worrisome lesions. Scattered low-attenuation renal lesions are likely benign cysts. No worrisome renal lesions or hydronephrosis. 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 The bladder is unremarkable. Stomach/Bowel: The stomach, duodenum, small bowel and colon are grossly normal. Vascular/Lymphatic: Advanced atherosclerotic calcifications involving the aorta and branch vessels but no aneurysm. The major venous structures are patent. No mesenteric or retroperitoneal mass or adenopathy. Small scattered lymph nodes are noted. Reproductive: The uterus and ovaries are unremarkable. Other: No pelvic mass or adenopathy. No free pelvic fluid collections. No inguinal mass or adenopathy. No abdominal wall hernia or subcutaneous lesions.  Musculoskeletal: No significant bony findings. IMPRESSION: 1. Diffuse and severe fatty infiltration of the liver. 2. No acute abdominal/pelvic findings, mass lesions or adenopathy. 3. Advanced atherosclerotic calcifications involving the aorta and branch vessels. 4. 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 5. Emphysematous  changes and pulmonary scarring at the lung bases. 6. Emphysema and aortic atherosclerosis. Aortic Atherosclerosis (ICD10-I70.0) and Emphysema (ICD10-J43.9). Electronically Signed   By: Marijo Sanes M.D.   On: 07/27/2020 19:46   DG Chest Port 1 View  Result Date: 08/01/2020 CLINICAL DATA:  Shortness of breath.  COVID. EXAM: PORTABLE CHEST 1 VIEW COMPARISON:  CT 01/15/2020.  Chest x-ray 08/18/2019, 05/11/2018. FINDINGS: Mediastinum and hilar structures are normal. COPD. Chronic bilateral interstitial changes are noted. Bibasilar subsegmental atelectasis and or scarring again noted. No acute infiltrate noted. Previously noted pulmonary nodules on CT of 01/16/2020 best evaluated by prior CT. No pleural effusion or pneumothorax. IMPRESSION: 1. COPD. Chronic bilateral interstitial changes and bibasilar subsegmental atelectasis and or scarring. 2. Previously noted pulmonary nodules noted on CT of 01/16/2020 best evaluated by CT. Reference is made to that report. Electronically Signed   By: Marcello Moores  Register   On: 08/01/2020 08:21   DG Chest Portable 1 View  Result Date: 07/27/2020 CLINICAL DATA:  SOB EXAM: PORTABLE CHEST 1 VIEW COMPARISON:  01/16/2020 and prior. FINDINGS: Emphysematous changes. Patchy right predominant basilar opacities. No pneumothorax or pleural effusion. Cardiomediastinal silhouette within normal limits. IMPRESSION: Right predominant basilar opacities, atelectasis versus infiltrate. Emphysema. Electronically Signed   By: Primitivo Gauze M.D.   On: 07/27/2020 15:25   VAS [place] LOWER EXTREMITY VENOUS (DVT)  Result Date: 07/30/2020  Lower Venous DVT Study Indications: Covid, d-dimer.  Anticoagulation: Lovenox. Limitations: Poor ultrasound/tissue interface due to superficial skin changes. Comparison Study: 03-20-2020 Prior LT lower extremity venous was negative for                   DVT. Performing Technologist: Darlin Coco RDMS  Examination Guidelines: A complete evaluation includes B-mode  imaging, spectral Doppler, color Doppler, and power Doppler as needed of all accessible portions of each vessel. Bilateral testing is considered an integral part of a complete examination. Limited examinations for reoccurring indications may be performed as noted. The reflux portion of the exam is performed with the patient in reverse Trendelenburg.  +---------+---------------+---------+-----------+----------+-------------------+ RIGHT    CompressibilityPhasicitySpontaneityPropertiesThrombus Aging      +---------+---------------+---------+-----------+----------+-------------------+ CFV      Full           Yes      Yes                                      +---------+---------------+---------+-----------+----------+-------------------+ SFJ      Full                                                             +---------+---------------+---------+-----------+----------+-------------------+ FV Prox  Full                                                             +---------+---------------+---------+-----------+----------+-------------------+  FV Mid   Full                                                             +---------+---------------+---------+-----------+----------+-------------------+ FV DistalFull                                                             +---------+---------------+---------+-----------+----------+-------------------+ PFV      Full                                                             +---------+---------------+---------+-----------+----------+-------------------+ POP      Full           Yes      Yes                                      +---------+---------------+---------+-----------+----------+-------------------+ PTV      Full                                         Some segments not                                                         well visualized      +---------+---------------+---------+-----------+----------+-------------------+ PERO     Full                                         Some segments not                                                         well visualized     +---------+---------------+---------+-----------+----------+-------------------+   +---------+---------------+---------+-----------+----------+-------------------+ LEFT     CompressibilityPhasicitySpontaneityPropertiesThrombus Aging      +---------+---------------+---------+-----------+----------+-------------------+ CFV      Full           Yes      Yes                                      +---------+---------------+---------+-----------+----------+-------------------+ SFJ      Full                                                             +---------+---------------+---------+-----------+----------+-------------------+  FV Prox  Full                                                             +---------+---------------+---------+-----------+----------+-------------------+ FV Mid   Full                                                             +---------+---------------+---------+-----------+----------+-------------------+ FV DistalFull                                                             +---------+---------------+---------+-----------+----------+-------------------+ PFV      Full                                                             +---------+---------------+---------+-----------+----------+-------------------+ POP      Full           Yes      Yes                                      +---------+---------------+---------+-----------+----------+-------------------+ PTV      Full                                         Some segments not                                                         well visualized     +---------+---------------+---------+-----------+----------+-------------------+  PERO     Full                                         Some segments not                                                         well visualized     +---------+---------------+---------+-----------+----------+-------------------+     Summary: RIGHT: - There is no evidence of deep vein thrombosis in the lower extremity. However, portions of this examination were limited- see technologist comments above.  - No cystic structure found in the popliteal fossa.  LEFT: - There is no evidence of deep vein  thrombosis in the lower extremity. However, portions of this examination were limited- see technologist comments above.  - No cystic structure found in the popliteal fossa.  *See table(s) above for measurements and observations. Electronically signed by Ruta Hinds MD on 07/30/2020 at 4:19:16 PM.    Final    ECHOCARDIOGRAM LIMITED  Result Date: 07/28/2020    ECHOCARDIOGRAM LIMITED REPORT   Patient Name:   [name] Date of Exam: 07/28/2020 Medical Rec #:  [id number]       Height:       62.0 in Accession #:    [id number]      Weight:       108.0 lb Date of Birth:  [birth year]       BSA:          1.471 m Patient Age:    10 years        BP:           114/78 mmHg Patient Gender: F               HR:           83 bpm. Exam Location:  Inpatient Procedure: Limited Echo, Limited Color Doppler and Cardiac Doppler Indications:    atrial fibrillation  History:        Patient has no prior history of Echocardiogram examinations.                 Covid and COPD; Risk Factors:Hypertension.  Sonographer:    Johny Chess Referring Phys: [place]  1. Left ventricular ejection fraction, by estimation, is 55 to 60%. The left ventricle has normal function. The left ventricle has no regional wall motion abnormalities. Left ventricular diastolic parameters were normal.  2. Right ventricular systolic function is normal. The right ventricular size is mildly enlarged.  3. The mitral valve is normal  in structure. No evidence of mitral valve regurgitation.  4. The aortic valve is tricuspid. Aortic valve regurgitation is not visualized. No aortic stenosis is present.  5. The inferior vena cava is dilated in size with >50% respiratory variability, suggesting right atrial pressure of 8 mmHg. Comparison(s): No prior Echocardiogram. FINDINGS  Left Ventricle: Left ventricular ejection fraction, by estimation, is 55 to 60%. The left ventricle has normal function. The left ventricle has no regional wall motion abnormalities. The left ventricular internal cavity size was normal in size. There is  no left ventricular hypertrophy. Left ventricular diastolic parameters were normal. Right Ventricle: The right ventricular size is mildly enlarged. Right ventricular systolic function is normal. Mitral Valve: The mitral valve is normal in structure. Tricuspid Valve: The tricuspid valve is normal in structure. Tricuspid valve regurgitation is not demonstrated. Aortic Valve: The aortic valve is tricuspid. Aortic valve regurgitation is not visualized. No aortic stenosis is present. Aorta: The aortic root is normal in size and structure and the ascending aorta was not well visualized. Venous: The inferior vena cava is dilated in size with greater than 50% respiratory variability, suggesting right atrial pressure of 8 mmHg. LEFT VENTRICLE PLAX 2D LVIDd:         4.40 cm  Diastology LVIDs:         2.50 cm  LV e' medial:    10.60 cm/s LV PW:         0.80 cm  LV E/e' medial:  7.9 LV IVS:        0.70 cm  LV e' lateral:   11.50 cm/s LVOT diam:  1.90 cm  LV E/e' lateral: 7.3 LV SV:         66 LV SV Index:   45 LVOT Area:     2.84 cm  IVC IVC diam: 2.40 cm LEFT ATRIUM         Index LA diam:    3.70 cm 2.51 cm/m  AORTIC VALVE LVOT Vmax:   137.00 cm/s LVOT Vmean:  87.500 cm/s LVOT VTI:    0.233 m  AORTA Ao Root diam: 2.90 cm MITRAL VALVE MV Area (PHT): 4.10 cm    SHUNTS MV Decel Time: 185 msec    Systemic VTI:  0.23 m MV E velocity:  83.60 cm/s  Systemic Diam: 1.90 cm MV A velocity: 71.10 cm/s MV E/A ratio:  1.18 Rudean Haskell MD Electronically signed by Rudean Haskell MD Signature Date/Time: 07/28/2020/5:07:15 PM    Final     Assessment/Plan  1. COVID-19 virus infection -  He is vaccinated but not boosted -   Treated with steroids and remdesivir  2. COPD with acute exacerbation ([place]) -   Most probably triggered by COVID-19 infection -   Was treated with IV Solu-Medrol and doxycycline -    Continue budesonide, Yupelri and Serevent diskus -     Referred to palliative care, changed diet to regular with no restrictions  3. Atrial fibrillation with RVR ([place]) -   Poor candidate for anticoagulation -   Cardiology recommended Cardizem and aspirin  4. Chronic respiratory failure with hypoxia ([facility]) -   On O2 at 4 to 5 L via [place] -    Continue PRN albuterol  5. Pulmonary hypertension, primary ([place]) -   Continue spironolactone and furosemide  6. Major depression, recurrent, chronic ([facility]) -    Bupropion will be held due to elevated LFTs  7. Alcoholic hepatitis without [facility] Lab Results  Component Value Date   ALT 91 (H) 08/03/2020   AST 46 (H) 08/03/2020   GGT 326 (H) 08/19/2019   ALKPHOS 86 08/03/2020   BILITOT 0.3 08/03/2020   -   CT abdomen with significant fatty infiltration -    Will hold Bupropion and repeat LFTs in a week     Family/ staff Communication: Discussed plan of care with resident and charge nurse.  Labs/tests ordered: LFT on 08/14/21  Goals of care:   Short-term care/palliative care   Durenda Age, DNP, MSN, FNP-BC [facility] [phone] (Monday-Friday 8:00 a.m. - 5:00 p.m.) [phone] (after hours)

## 2020-08-09 ENCOUNTER — Other Ambulatory Visit: Payer: Self-pay | Admitting: Adult Health

## 2020-08-09 MED ORDER — HYDROCODONE-ACETAMINOPHEN 7.5-325 MG PO TABS: 1.0000 | ORAL_TABLET | Freq: Four times a day (QID) | ORAL | 0 refills | Status: DC | PRN

## 2020-08-10 ENCOUNTER — Non-Acute Institutional Stay (SKILLED_NURSING_FACILITY): Payer: Medicare Other | Admitting: Internal Medicine

## 2020-08-10 ENCOUNTER — Encounter: Payer: Self-pay | Admitting: Internal Medicine

## 2020-08-10 DIAGNOSIS — I4891 Unspecified atrial fibrillation: Secondary | ICD-10-CM

## 2020-08-10 DIAGNOSIS — U071 COVID-19: Secondary | ICD-10-CM | POA: Diagnosis not present

## 2020-08-10 DIAGNOSIS — R7401 Elevation of levels of liver transaminase levels: Secondary | ICD-10-CM

## 2020-08-10 DIAGNOSIS — D649 Anemia, unspecified: Secondary | ICD-10-CM

## 2020-08-10 DIAGNOSIS — E43 Unspecified severe protein-calorie malnutrition: Secondary | ICD-10-CM

## 2020-08-10 DIAGNOSIS — J441 Chronic obstructive pulmonary disease with (acute) exacerbation: Secondary | ICD-10-CM

## 2020-08-10 NOTE — Assessment & Plan Note (Signed)
Current albumin 2.6 and total protein 4.9.  Nutrition consult at SNF.

## 2020-08-10 NOTE — Patient Instructions (Signed)
See assessment and plan under each diagnosis in the problem list and acutely for this visit

## 2020-08-10 NOTE — Assessment & Plan Note (Signed)
The long-acting anticholinergic inhaler would cost over $1200.  This will be changed to Atrovent MDI 2 puffs every 6 hours as needed.

## 2020-08-10 NOTE — Assessment & Plan Note (Signed)
Current H/H 8.3/25.9 with normochromic, normocytic indices.  Monitor for bleeding dyscrasias.

## 2020-08-10 NOTE — Progress Notes (Signed)
NURSING HOME LOCATION:  [facility] ROOM NUMBER: [id number]   CODE STATUS:DNR    PCP: Christain Sacramento, MD   This is a comprehensive admission note to [facility] performed on this date less than 30 days from date of admission. Included are preadmission medical/surgical history; reconciled medication list; family history; social history and comprehensive review of systems.  Corrections and additions to the records were documented. Comprehensive physical exam was also performed. Additionally a clinical summary was entered for each active diagnosis pertinent to this admission in the Problem List to enhance continuity of care.  HPI: Patient was hospitalized 2/11-2/22/2022 admitted from home with worsening cough and dyspnea in the context of a COPD exacerbation. COVID-19 testing was positive; the patient has been vaccinated but not boosted. Chest x-ray revealed right greater than left basilar opacities.  She was found to have A. fib with RVR. Cardizem and heparin were infused. TTE revealed LVEF 55-60% with normal LV function. Right ventricle was slightly enlarged. She was felt to be a poor candidate for anticoagulation. Cardiology recommended maintenance Cardizem and aspirin. LFTs were significantly elevated.CT of the abdomen/pelvis revealed diffuse severe fatty infiltration of the liver. This is in the context of a history of alcohol abuse. Clinically she was felt to have acute alcoholic hepatitis; LFTs did slowly trend downward. Bupropion was held pending normalization of LFTs. She did not have any decubiti but did have significant eczematous dermatitis with diffuse keratotic lesions of chest and bilateral lower extremities. Once stable Dermatology follow-up was to be recommended. She received IV Solu-Medrol, remdesivir, and doxycycline for the COVID-19 induced COPD exacerbation. Aggressive pulmonary toilet and supplemental oxygen were pursued.  D-dimer was elevated; she was on prophylactic  Lovenox. She was not felt to be a good candidate for anticoagulation. She did exhibit anemia which was attributed to chronic disease. 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 Patient was aware of her poor prognosis and wanted to enter SNF. She would consider transition to Hospice if she declines further. She was made a DNR.  Past medical and surgical history: Includes history of pulmonary hypertension, essential hypertension, and CAD. Surgeries and procedures include cystocele repair, shoulder arthroscopy with rotator cuff repair and colonoscopy.  Social history: History of both alcohol and tobacco abuse.  She has a history of almost 60 pack years of smoking.  Family history: Reviewed   Review of systems: Initially she stated that she was doing "pretty good".  Then she indicated that her breathing "ain't no joke" and stated that it was minimally better or possibly stable.  She describes the production of minimal amounts of green phlegm.  She has noticed some decrease in her taste.  She describes pain across the upper chest intermittently.  When I asked whether it were sharp, dull, burning, or throbbing her response was "yes".  She then seemed to indicate that it was more of a throbbing type discomfort.  She states that Tylenol did not help.  She is on Imdur.  She notes decreased volume of stool without frank constipation.  Constitutional: No fever, significant weight change Eyes: No redness, discharge, pain, vision change ENT/mouth: No nasal congestion, purulent discharge, earache, change in hearing, sore throat  Cardiovascular: No palpitations, paroxysmal nocturnal dyspnea, claudication, edema  Respiratory: No hemoptysis,significant snoring, apnea Gastrointestinal: No heartburn, dysphagia, abdominal pain, nausea /vomiting, rectal bleeding, melena Genitourinary: No dysuria, hematuria, pyuria, incontinence,  nocturia Musculoskeletal: No joint stiffness, joint swelling Dermatologic:  No rash, pruritus, change in appearance of skin Neurologic: No dizziness, headache, syncope, seizures, numbness, tingling Psychiatric: No significant anxiety, depression, insomnia, anorexia Endocrine: No change in hair/skin/nails, excessive thirst, excessive hunger, excessive urination  Hematologic/lymphatic: No significant lymphadenopathy, abnormal bleeding Allergy/immunology: No itchy/watery eyes, significant sneezing, urticaria, angioedema  Physical exam:  Pertinent or positive findings: She appears grossly cachectic with weathered facies.  She appears much older than her stated age.  Hair is disheveled.  She is wearing a nasal oxygen.  She has an upper plate & lower partial.  The AP diameter of the chest is increased and breath sounds are distant.  Heart sounds are also distant, heard best in the epigastrium.  The rhythm is slightly irregular.  Abdomen is protuberant and tympanic to percussion.  Dorsalis pedis pulses are stronger than the posterior tibial pulses.  Slight clubbing of the nailbeds is noted.  She has diffuse bruising over the thorax as well as the upper lower extremities.  She also has diffuse severe keratotic lesions with thickening of the skin over the shins.  General appearance: no acute distress Lymphatic: No lymphadenopathy about the head, neck, axilla. Eyes: No conjunctival inflammation or lid edema is present. There is no scleral icterus. Ears:  External ear exam shows no significant lesions or deformities.   Nose:  External nasal examination shows no deformity or inflammation. Nasal mucosa are pink and moist without lesions, exudates Neck:  No thyromegaly, masses, tenderness noted.    Heart:  No gallop, murmur, click, rub.  Lungs: without wheezes, rhonchi, rales, rubs. Abdomen: Bowel sounds are normal.  Abdomen is soft and nontender with no organomegaly, hernias, masses. GU: Deferred   Extremities:  No cyanosis, edema. Neurologic exam:  Balance, Rhomberg, finger to nose testing could not be completed due to clinical state Skin: Warm & dry w/o tenting.  See clinical summary under each active problem in the Problem List with associated updated therapeutic plan

## 2020-08-10 NOTE — Assessment & Plan Note (Signed)
Continue aggressive pulmonary toilet.  The long-acting anticholinergic will be changed to Atrovent due to cost.  Presently O2 sats satisfactory on high flow nasal oxygen.

## 2020-08-12 NOTE — Assessment & Plan Note (Signed)
AST peaked @ 1154 but down to 46 @ D/C ALT 270 but dropped to 91 No hepatotoxic meds @ present dosages identified

## 2020-08-12 NOTE — Assessment & Plan Note (Signed)
Rate adequately controlled; rhythm slightly irregular.

## 2020-08-13 ENCOUNTER — Telehealth: Payer: Self-pay | Admitting: Internal Medicine

## 2020-08-13 NOTE — Telephone Encounter (Signed)
08/13/20  Called and spoke with patient caregiver.  Patient is currently at [facility] rehab.  Patient status post hospitalization COVID-19.  Nothing further needed at this point time  Schedule patient for hospital follow-up with Dr. Melvyn Novas on 08/31/2020 at 3:15 PM.  Nothing further needed.  Wyn Quaker FNP

## 2020-08-16 ENCOUNTER — Non-Acute Institutional Stay (INDEPENDENT_AMBULATORY_CARE_PROVIDER_SITE_OTHER): Payer: Self-pay | Admitting: Adult Health

## 2020-08-16 ENCOUNTER — Encounter: Payer: Self-pay | Admitting: Adult Health

## 2020-08-16 DIAGNOSIS — J449 Chronic obstructive pulmonary disease, unspecified: Secondary | ICD-10-CM

## 2020-08-16 DIAGNOSIS — I4891 Unspecified atrial fibrillation: Secondary | ICD-10-CM

## 2020-08-16 DIAGNOSIS — I270 Primary pulmonary hypertension: Secondary | ICD-10-CM

## 2020-08-16 NOTE — Progress Notes (Signed)
Location:  [facility] Room Number: [place] of Service:  SNF (31) Provider:  Durenda Age, DNP, FNP-BC  Patient Care Team: Christain Sacramento, MD as PCP - General (Family Medicine) Collier [place] (Inactive) (Dermatology)  Extended Emergency Contact Information Primary Emergency Contact: Dala Dock Mobile Phone: [phone] Relation: Friend Secondary Emergency Contact: [place] Phone: [phone] Relation: Friend  Code Status:   Full  Goals of care: Advanced Directive information Advanced Directives 08/16/2020  Does Patient Have a Medical Advance Directive? Yes  Type of Advance Directive Out of facility DNR (pink MOST or yellow form)  Does patient want to make changes to medical advance directive? No - Patient declined  Would patient like information on creating a medical advance directive? -     Chief Complaint  Patient presents with  . Acute Visit    Short term rehab visit    HPI:  Pt is a 75 y.o. female seen today for medical management of chronic diseases.  She has a PMH of EtOH use, anemia of chronic disease, eczematous dermatitis, COPD on 3 L O2 at home and pulmonary hypertension. She was admitted to [facility] on 08/07/20 post [facility] hospitalization 07/27/20 to 08/07/20 for COVID-19 infection and COPD exacerbation. She is currently followed by palliative care and is DNR. She takes Serevent discus 50 mcg 1 puff into the lungs twice a day, budesonide 0.5 mg / 2 mL via nebulizer twice a day, PRN Robafen, PRN albuterol and Atrovent 17 mcg HFA inhaler 2 puffs into the lungs every 6 hours PRN for COPD.  She is on 5 L O2. HRs ranging from 78 to 98. She takes diltiazem 24-hour ER 360 mg 1 capsule daily for atrial fibrillation.  She is a poor candidate for anticoagulation.   Past Medical History:  Diagnosis Date  . Abrasion    under left knee x 2 places changing dressing q  day of bid, applying ssd cream area healing due to fell 2 weeks ago  . Blood dyscrasia    BLEEDS EASY  . Bruises easily    bleeds easily  . Closed patellar sleeve fracture of left knee 01/04/2018  . Closed patellar sleeve fracture of right knee 01/04/2018   healing   . COPD (chronic obstructive pulmonary disease) ([place])   . Coronary artery disease    per dr Philis Kendall note 01-01-18 note  . Hernia, inguinal    LEFT  . Hypertension    states under control with meds., has been on med. > 10 yr.  . On home oxygen therapy    at night 2L/min  . Pneumonia    "SEVERAL TIMES IN PAST, LAST TIME 2018"  . Pulmonary hypertension ([place])    per dr Terrence Dupont 7-19 19 lov note  . Shortness of breath dyspnea    with exertion  . SVD (spontaneous vaginal delivery)    x 1  . Thin skin   . Urinary tract infection 2019  . Wears partial dentures    lower partial and upper plate   Past Surgical History:  Procedure Laterality Date  . COLONOSCOPY     Less than 10 years ago per patient   . CYSTOCELE REPAIR N/A 03/02/2018   Procedure: ANTERIOR REPAIR (CYSTOCELE);  Surgeon: Cheri Fowler, MD;  Location: [facility];  Service: Gynecology;  Laterality: N/A;  OUTPATIENT IN BED  . MULTIPLE TOOTH EXTRACTIONS    . RESECTION DISTAL CLAVICAL Left 10/12/2014   Procedure:  DISTAL CLAVICLE EXCISION;  Surgeon: Kathryne Hitch, MD;  Location: [facility];  Service: Orthopedics;  Laterality: Left;  . SHOULDER ARTHROSCOPY WITH ROTATOR CUFF REPAIR AND SUBACROMIAL DECOMPRESSION Left 10/12/2014   Procedure: LEFT SHOULDER ARTHROSCOPY, DEBRIDEMENT WITH ACROMIOPLASTY,  ROTATOR CUFF REPAIR AND RELEASE BICEPS TENDON;  Surgeon: Kathryne Hitch, MD;  Location: [facility];  Service: Orthopedics;  Laterality: Left;    Allergies  Allergen Reactions  . Tramadol Other (See Comments)    GI UPSET    Outpatient Encounter Medications as of 08/16/2020  Medication Sig  . albuterol (PROVENTIL) (2.5 MG/3ML) 0.083%  nebulizer solution Take 3 mLs (2.5 mg total) by nebulization every 6 (six) hours as needed for wheezing or shortness of breath.  Marland Kitchen albuterol (VENTOLIN HFA) 108 (90 Base) MCG/ACT inhaler Inhale 2 puffs into the lungs every 4 (four) hours as needed for wheezing or shortness of breath.  Marland Kitchen aspirin 81 MG chewable tablet Chew 1 tablet (81 mg total) by mouth daily.  . B Complex-C (B-COMPLEX WITH VITAMIN C) tablet Take 1 tablet by mouth daily.  . bisacodyl (FLEET) 10 MG/30ML ENEM Place 10 mg rectally as needed for moderate constipation.  . budesonide (PULMICORT) 0.5 MG/2ML nebulizer solution Take 2 mLs (0.5 mg total) by nebulization in the morning and at bedtime. DX: J44.9  . diltiazem (CARDIZEM CD) 360 MG 24 hr capsule Take 360 mg by mouth daily.   . Emollient (AQUAPHOR EX) Apply topically. Apply to lower ext twice a day (avoid heels due to wounds / dressing)  . Ensure (ENSURE) Take 237 mLs by mouth in the morning and at bedtime.  . furosemide (LASIX) 40 MG tablet Take 40 mg by mouth daily.  Marland Kitchen guaiFENesin-dextromethorphan (ROBITUSSIN DM) 100-10 MG/5ML syrup Take 10 mLs by mouth every 4 (four) hours as needed for cough.  Marland Kitchen HYDROcodone-acetaminophen (NORCO) 7.5-325 MG tablet Take 1 tablet by mouth every 6 (six) hours as needed for moderate pain.  . hydrOXYzine (ATARAX/VISTARIL) 25 MG tablet Take 12.5 mg by mouth every 8 (eight) hours as needed for nausea.  Marland Kitchen ipratropium (ATROVENT HFA) 17 MCG/ACT inhaler Inhale 2 puffs into the lungs every 6 (six) hours.  . isosorbide mononitrate (IMDUR) 30 MG 24 hr tablet Take 1 tablet (30 mg total) by mouth daily.  . Magnesium Hydroxide (MILK OF MAGNESIA PO) Take 30 mLs by mouth as needed.  . Multiple Minerals-Vitamins (CAL MAG ZINC +D3 PO) Take 1 tablet by mouth at bedtime. For supplement  . ondansetron (ZOFRAN) 4 MG tablet Take 4 mg by mouth every 6 (six) hours as needed for nausea/vomiting.  . OXYGEN Inhale 4-5 L into the lungs continuous. TO MAINTAIN O2 SATURATION OF  >90% for Hypoxia  . pantoprazole (PROTONIX) 40 MG tablet Take 1 tablet (40 mg total) by mouth daily at 12 noon.  . potassium chloride (K-DUR) 10 MEQ tablet Take 10 mEq by mouth daily. When takes lasix  . Respiratory Therapy Supplies (FLUTTER) DEVI Use as directed  . salmeterol (SEREVENT) 50 MCG/DOSE diskus inhaler Inhale 1 puff into the lungs 2 (two) times daily.  Marland Kitchen spironolactone (ALDACTONE) 25 MG tablet Take 25 mg by mouth 2 (two) times daily.  Marland Kitchen triamcinolone (KENALOG) 0.1 % Apply 1 application topically daily as needed (skin irritation on legs).  . [DISCONTINUED] spironolactone (ALDACTONE) 25 MG tablet Take 1 tablet by mouth 2 (two) times daily.  . [DISCONTINUED] revefenacin (YUPELRI) 175 MCG/3ML nebulizer solution Take 3 mLs (175 mcg total) by nebulization daily. DX: J44.9   No  facility-administered encounter medications on file as of 08/16/2020.    Review of Systems  GENERAL: No fever or chills  MOUTH and THROAT: Denies oral discomfort, gingival pain or bleeding RESPIRATORY: + cough CARDIAC: No chest pain, edema  GI: No abdominal pain, diarrhea, constipation, heart burn, nausea or vomiting GU: Denies dysuria, frequency, hematuria or discharge NEUROLOGICAL: Denies dizziness, syncope, numbness, or headache PSYCHIATRIC: Denies feelings of depression or anxiety. No report of hallucinations, insomnia, paranoia, or agitation    Immunization History  Administered Date(s) Administered  . Fluad Quad(high Dose 65+) 04/18/2019  . Influenza Split 03/17/2011, 03/30/2013, 04/03/2014, 02/14/2015  . Influenza Whole 03/17/2012, 03/09/2016  . Influenza, High Dose Seasonal PF 03/10/2017, 03/15/2018, 04/18/2019, 02/28/2020  . PFIZER(Purple Top)SARS-COV-2 Vaccination 11/08/2019, 12/05/2019  . Pneumococcal Conjugate-13 01/16/2014, 04/18/2019  . Pneumococcal Polysaccharide-23 06/16/2008  . Tdap 06/16/2005, 07/15/2016   Pertinent  Health Maintenance Due  Topic Date Due  . COLONOSCOPY (Pts 45-48yr  Insurance coverage will need to be confirmed)  Never done  . MAMMOGRAM  Never done  . DEXA SCAN  Never done  . PNA vac Low Risk Adult (2 of 2 - PPSV23) 04/17/2020  . INFLUENZA VACCINE  Completed   No flowsheet data found.   Vitals:   08/16/20 1314  BP: 129/72  Pulse: 92  Resp: 19  Temp: 98.1 F (36.7 C)  Height: _0  (1.575 m)   Body mass index is 18.69 kg/m.  Physical Exam  GENERAL APPEARANCE:  In no acute distress.  SKIN:  Skin is warm and dry.  MOUTH and THROAT: Lips are without lesions. Oral mucosa is moist and without lesions.  RESPIRATORY: Decreased breath sounds, distant, on O2 at 5 L/minute, +wheezing  CARDIAC: no murmur,no extra heart sounds, no edema GI: Abdomen soft, normal BS, no masses, no tenderness NEUROLOGICAL: There is no tremor. Speech is clear. Alert and oriented X 3.  PSYCHIATRIC: Affect and behavior are appropriate  Labs reviewed: Recent Labs    07/28/20 0216 07/28/20 0743 08/01/20 0204 08/02/20 0045 08/03/20 0119  NA 141   < > 137 137 138  K 4.3   < > 3.9 3.7 3.9  CL 93*   < > 93* 89* 88*  CO2 28   < > 37* 39* 38*  GLUCOSE 215*   < > 200* 224* 229*  BUN 10   < > 19 27* 30*  CREATININE 0.92   < > 0.47 0.90 0.72  CALCIUM 7.9*   < > 8.5* 8.6* 8.9  MG 1.5*   < > 1.9 2.1 2.0  PHOS 3.4  --   --   --   --    < > = values in this interval not displayed.   Recent Labs    08/01/20 0204 08/02/20 0045 08/03/20 0119  AST 51* 50* 46*  ALT 105* 104* 91*  ALKPHOS 101 99 86  BILITOT 0.7 0.6 0.3  PROT 4.6* 5.0* 4.9*  ALBUMIN 2.4* 2.6* 2.6*   Recent Labs    07/27/20 1515 07/28/20 0216 08/01/20 0204 08/02/20 0045 08/03/20 0119  WBC 8.2   < > 9.2 11.0* 10.6*  NEUTROABS 4.2  --   --   --   --   HGB 8.6*   < > 8.2* 8.5* 8.3*  HCT 28.1*   < > 26.1* 26.6* 25.9*  MCV 101.8*   < > 99.6 98.5 99.2  PLT 119*   < > 111* 124* 136*   < > = values in this interval not displayed.  Lab Results  Component Value Date   TSH 0.953 07/28/2020   Lab  Results  Component Value Date   HGBA1C 6.1 (H) 05/13/2018   Lab Results  Component Value Date   CHOL 136 07/28/2020   HDL 83 07/28/2020   LDLCALC 43 07/28/2020   TRIG 48 07/28/2020   CHOLHDL 1.6 07/28/2020    Significant Diagnostic Results in last 30 days:  CT ABDOMEN PELVIS W CONTRAST  Result Date: 07/27/2020 CLINICAL DATA:  Elevated liver function studies. EXAM: CT ABDOMEN AND PELVIS WITH CONTRAST TECHNIQUE: Multidetector CT imaging of the abdomen and pelvis was performed using the standard protocol following bolus administration of intravenous contrast. CONTRAST:  25m OMNIPAQUE IOHEXOL 300 MG/ML  SOLN COMPARISON:  None. FINDINGS: Lower chest: Moderate breathing motion artifact. There are emphysematous changes and pulmonary scarring but no definite infiltrates or effusions. The heart is within normal limits in size. Moderate aortic calcifications. Hepatobiliary: Diffuse and severe fatty infiltration of the liver with focal areas of more pronounced fatty change. No worrisome hepatic lesions or intrahepatic biliary dilatation. The gallbladder is unremarkable. No common bile duct dilatation. Pancreas: Advanced pancreatic atrophy but no mass, inflammation or ductal dilatation. Spleen: Normal size.  No focal lesions. Adrenals/Urinary Tract: Mild nodularity of both adrenal glands but no worrisome lesions. Scattered low-attenuation renal lesions are likely benign cysts. No worrisome renal lesions or hydronephrosis. 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 The bladder is unremarkable. Stomach/Bowel: The stomach, duodenum, small bowel and colon are grossly normal. Vascular/Lymphatic: Advanced atherosclerotic calcifications involving the aorta and branch vessels but no aneurysm. The major venous structures are patent. No mesenteric or retroperitoneal mass or adenopathy. Small scattered lymph nodes are noted. Reproductive: The uterus and ovaries are unremarkable. Other: No  pelvic mass or adenopathy. No free pelvic fluid collections. No inguinal mass or adenopathy. No abdominal wall hernia or subcutaneous lesions. Musculoskeletal: No significant bony findings. IMPRESSION: 1. Diffuse and severe fatty infiltration of the liver. 2. No acute abdominal/pelvic findings, mass lesions or adenopathy. 3. Advanced atherosclerotic calcifications involving the aorta and branch vessels. 4. 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 5. Emphysematous changes and pulmonary scarring at the lung bases. 6. Emphysema and aortic atherosclerosis. Aortic Atherosclerosis (ICD10-I70.0) and Emphysema (ICD10-J43.9). Electronically Signed   By: PMarijo SanesM.D.   On: 07/27/2020 19:46   DG Chest Port 1 View  Result Date: 08/01/2020 CLINICAL DATA:  Shortness of breath.  COVID. EXAM: PORTABLE CHEST 1 VIEW COMPARISON:  CT 01/15/2020.  Chest x-ray 08/18/2019, 05/11/2018. FINDINGS: Mediastinum and hilar structures are normal. COPD. Chronic bilateral interstitial changes are noted. Bibasilar subsegmental atelectasis and or scarring again noted. No acute infiltrate noted. Previously noted pulmonary nodules on CT of 01/16/2020 best evaluated by prior CT. No pleural effusion or pneumothorax. IMPRESSION: 1. COPD. Chronic bilateral interstitial changes and bibasilar subsegmental atelectasis and or scarring. 2. Previously noted pulmonary nodules noted on CT of 01/16/2020 best evaluated by CT. Reference is made to that report. Electronically Signed   By: TMarcello Moores Register   On: 08/01/2020 08:21   DG Chest Portable 1 View  Result Date: 07/27/2020 CLINICAL DATA:  SOB EXAM: PORTABLE CHEST 1 VIEW COMPARISON:  01/16/2020 and prior. FINDINGS: Emphysematous changes. Patchy right predominant basilar opacities. No pneumothorax or pleural effusion. Cardiomediastinal silhouette within normal limits. IMPRESSION: Right predominant basilar opacities, atelectasis versus infiltrate. Emphysema.  Electronically Signed   By: CPrimitivo GauzeM.D.   On: 07/27/2020 15:25   VAS [place]  LOWER EXTREMITY VENOUS (DVT)  Result Date: 07/30/2020  Lower Venous DVT Study Indications: Covid, d-dimer.  Anticoagulation: Lovenox. Limitations: Poor ultrasound/tissue interface due to superficial skin changes. Comparison Study: 03-20-2020 Prior LT lower extremity venous was negative for                   DVT. Performing Technologist: Darlin Coco RDMS  Examination Guidelines: A complete evaluation includes B-mode imaging, spectral Doppler, color Doppler, and power Doppler as needed of all accessible portions of each vessel. Bilateral testing is considered an integral part of a complete examination. Limited examinations for reoccurring indications may be performed as noted. The reflux portion of the exam is performed with the patient in reverse Trendelenburg.  +---------+---------------+---------+-----------+----------+-------------------+ RIGHT    CompressibilityPhasicitySpontaneityPropertiesThrombus Aging      +---------+---------------+---------+-----------+----------+-------------------+ CFV      Full           Yes      Yes                                      +---------+---------------+---------+-----------+----------+-------------------+ SFJ      Full                                                             +---------+---------------+---------+-----------+----------+-------------------+ FV Prox  Full                                                             +---------+---------------+---------+-----------+----------+-------------------+ FV Mid   Full                                                             +---------+---------------+---------+-----------+----------+-------------------+ FV DistalFull                                                             +---------+---------------+---------+-----------+----------+-------------------+ PFV      Full                                                              +---------+---------------+---------+-----------+----------+-------------------+ POP      Full           Yes      Yes                                      +---------+---------------+---------+-----------+----------+-------------------+ PTV      Full  Some segments not                                                         well visualized     +---------+---------------+---------+-----------+----------+-------------------+ PERO     Full                                         Some segments not                                                         well visualized     +---------+---------------+---------+-----------+----------+-------------------+   +---------+---------------+---------+-----------+----------+-------------------+ LEFT     CompressibilityPhasicitySpontaneityPropertiesThrombus Aging      +---------+---------------+---------+-----------+----------+-------------------+ CFV      Full           Yes      Yes                                      +---------+---------------+---------+-----------+----------+-------------------+ SFJ      Full                                                             +---------+---------------+---------+-----------+----------+-------------------+ FV Prox  Full                                                             +---------+---------------+---------+-----------+----------+-------------------+ FV Mid   Full                                                             +---------+---------------+---------+-----------+----------+-------------------+ FV DistalFull                                                             +---------+---------------+---------+-----------+----------+-------------------+ PFV      Full                                                              +---------+---------------+---------+-----------+----------+-------------------+ POP      Full  Yes      Yes                                      +---------+---------------+---------+-----------+----------+-------------------+ PTV      Full                                         Some segments not                                                         well visualized     +---------+---------------+---------+-----------+----------+-------------------+ PERO     Full                                         Some segments not                                                         well visualized     +---------+---------------+---------+-----------+----------+-------------------+     Summary: RIGHT: - There is no evidence of deep vein thrombosis in the lower extremity. However, portions of this examination were limited- see technologist comments above.  - No cystic structure found in the popliteal fossa.  LEFT: - There is no evidence of deep vein thrombosis in the lower extremity. However, portions of this examination were limited- see technologist comments above.  - No cystic structure found in the popliteal fossa.  *See table(s) above for measurements and observations. Electronically signed by Ruta Hinds MD on 07/30/2020 at 4:19:16 PM.    Final    ECHOCARDIOGRAM LIMITED  Result Date: 07/28/2020    ECHOCARDIOGRAM LIMITED REPORT   Patient Name:   LAVETA GILKEY [facility] Date of Exam: 07/28/2020 Medical Rec #:  [id number]       Height:       62.0 in Accession #:    [id number]      Weight:       108.0 lb Date of Birth:  [birth year]       BSA:          1.471 m Patient Age:    75 years        BP:           114/78 mmHg Patient Gender: F               HR:           83 bpm. Exam Location:  Inpatient Procedure: Limited Echo, Limited Color Doppler and Cardiac Doppler Indications:    atrial fibrillation  History:        Patient has no prior history of Echocardiogram examinations.                  Covid and COPD; Risk Factors:Hypertension.  Sonographer:    Johny Chess Referring Phys: [place]  1. Left ventricular ejection fraction, by estimation, is 55 to  60%. The left ventricle has normal function. The left ventricle has no regional wall motion abnormalities. Left ventricular diastolic parameters were normal.  2. Right ventricular systolic function is normal. The right ventricular size is mildly enlarged.  3. The mitral valve is normal in structure. No evidence of mitral valve regurgitation.  4. The aortic valve is tricuspid. Aortic valve regurgitation is not visualized. No aortic stenosis is present.  5. The inferior vena cava is dilated in size with >50% respiratory variability, suggesting right atrial pressure of 8 mmHg. Comparison(s): No prior Echocardiogram. FINDINGS  Left Ventricle: Left ventricular ejection fraction, by estimation, is 55 to 60%. The left ventricle has normal function. The left ventricle has no regional wall motion abnormalities. The left ventricular internal cavity size was normal in size. There is  no left ventricular hypertrophy. Left ventricular diastolic parameters were normal. Right Ventricle: The right ventricular size is mildly enlarged. Right ventricular systolic function is normal. Mitral Valve: The mitral valve is normal in structure. Tricuspid Valve: The tricuspid valve is normal in structure. Tricuspid valve regurgitation is not demonstrated. Aortic Valve: The aortic valve is tricuspid. Aortic valve regurgitation is not visualized. No aortic stenosis is present. Aorta: The aortic root is normal in size and structure and the ascending aorta was not well visualized. Venous: The inferior vena cava is dilated in size with greater than 50% respiratory variability, suggesting right atrial pressure of 8 mmHg. LEFT VENTRICLE PLAX 2D LVIDd:         4.40 cm  Diastology LVIDs:         2.50 cm  LV e' medial:    10.60 cm/s LV PW:         0.80 cm  LV  E/e' medial:  7.9 LV IVS:        0.70 cm  LV e' lateral:   11.50 cm/s LVOT diam:     1.90 cm  LV E/e' lateral: 7.3 LV SV:         66 LV SV Index:   45 LVOT Area:     2.84 cm  IVC IVC diam: 2.40 cm LEFT ATRIUM         Index LA diam:    3.70 cm 2.51 cm/m  AORTIC VALVE LVOT Vmax:   137.00 cm/s LVOT Vmean:  87.500 cm/s LVOT VTI:    0.233 m  AORTA Ao Root diam: 2.90 cm MITRAL VALVE MV Area (PHT): 4.10 cm    SHUNTS MV Decel Time: 185 msec    Systemic VTI:  0.23 m MV E velocity: 83.60 cm/s  Systemic Diam: 1.90 cm MV A velocity: 71.10 cm/s MV E/A ratio:  1.18 Rudean Haskell MD Electronically signed by Rudean Haskell MD Signature Date/Time: 07/28/2020/5:07:15 PM    Final     Assessment/Plan  1. COPD GOLD III -   On O2 @ 5L/min via [place] continuously -Continue PRN albuterol HFA, PRN Atrovent HFA, Serevent Diskus inhaler, budesonide neb and PRN Robafen -  Currently followed by palliative care  2. New onset a-fib ([place]) -  Rate-controlled -Not on anticoagulation, poor candidate -Continue Cardizem and aspirin  3. Pulmonary hypertension, primary ([place]) -  Continue furosemide and spironolactone     Family/ staff Communication: Discussed plan of care with resident and charge nurse.  Labs/tests ordered: None  Goals of care:   Short-term care/palliative care   Durenda Age, DNP, MSN, FNP-BC [facility] [phone] (Monday-Friday 8:00 a.m. - 5:00 p.m.) [phone] (after hours)

## 2020-08-18 ENCOUNTER — Emergency Department (HOSPITAL_COMMUNITY): Payer: Medicare Other

## 2020-08-18 ENCOUNTER — Inpatient Hospital Stay (HOSPITAL_COMMUNITY)
Admission: EM | Admit: 2020-08-18 | Discharge: 2020-08-23 | DRG: 871 | Disposition: A | Discharge status: Hospice | Payer: Medicare Other | Source: Skilled Nursing Facility | Attending: Internal Medicine | Admitting: Internal Medicine

## 2020-08-18 ENCOUNTER — Other Ambulatory Visit: Payer: Self-pay

## 2020-08-18 ENCOUNTER — Encounter (HOSPITAL_COMMUNITY): Payer: Self-pay | Admitting: Emergency Medicine

## 2020-08-18 DIAGNOSIS — J189 Pneumonia, unspecified organism: Secondary | ICD-10-CM | POA: Diagnosis not present

## 2020-08-18 DIAGNOSIS — R6520 Severe sepsis without septic shock: Secondary | ICD-10-CM | POA: Diagnosis present

## 2020-08-18 DIAGNOSIS — Z9981 Dependence on supplemental oxygen: Secondary | ICD-10-CM

## 2020-08-18 DIAGNOSIS — E871 Hypo-osmolality and hyponatremia: Secondary | ICD-10-CM | POA: Diagnosis present

## 2020-08-18 DIAGNOSIS — D539 Nutritional anemia, unspecified: Secondary | ICD-10-CM | POA: Diagnosis present

## 2020-08-18 DIAGNOSIS — I10 Essential (primary) hypertension: Secondary | ICD-10-CM | POA: Diagnosis present

## 2020-08-18 DIAGNOSIS — R053 Chronic cough: Secondary | ICD-10-CM | POA: Diagnosis present

## 2020-08-18 DIAGNOSIS — R531 Weakness: Secondary | ICD-10-CM

## 2020-08-18 DIAGNOSIS — I2510 Atherosclerotic heart disease of native coronary artery without angina pectoris: Secondary | ICD-10-CM | POA: Diagnosis present

## 2020-08-18 DIAGNOSIS — E876 Hypokalemia: Secondary | ICD-10-CM | POA: Diagnosis not present

## 2020-08-18 DIAGNOSIS — D75839 Thrombocytosis, unspecified: Secondary | ICD-10-CM | POA: Diagnosis present

## 2020-08-18 DIAGNOSIS — J439 Emphysema, unspecified: Secondary | ICD-10-CM | POA: Diagnosis present

## 2020-08-18 DIAGNOSIS — L89622 Pressure ulcer of left heel, stage 2: Secondary | ICD-10-CM | POA: Diagnosis present

## 2020-08-18 DIAGNOSIS — Z7951 Long term (current) use of inhaled steroids: Secondary | ICD-10-CM

## 2020-08-18 DIAGNOSIS — Z885 Allergy status to narcotic agent status: Secondary | ICD-10-CM

## 2020-08-18 DIAGNOSIS — Z972 Presence of dental prosthetic device (complete) (partial): Secondary | ICD-10-CM

## 2020-08-18 DIAGNOSIS — Z8616 Personal history of COVID-19: Secondary | ICD-10-CM

## 2020-08-18 DIAGNOSIS — A4189 Other specified sepsis: Principal | ICD-10-CM | POA: Diagnosis present

## 2020-08-18 DIAGNOSIS — I4891 Unspecified atrial fibrillation: Secondary | ICD-10-CM | POA: Diagnosis present

## 2020-08-18 DIAGNOSIS — I480 Paroxysmal atrial fibrillation: Secondary | ICD-10-CM | POA: Diagnosis present

## 2020-08-18 DIAGNOSIS — J9610 Chronic respiratory failure, unspecified whether with hypoxia or hypercapnia: Secondary | ICD-10-CM

## 2020-08-18 DIAGNOSIS — I248 Other forms of acute ischemic heart disease: Secondary | ICD-10-CM | POA: Diagnosis present

## 2020-08-18 DIAGNOSIS — Z515 Encounter for palliative care: Secondary | ICD-10-CM

## 2020-08-18 DIAGNOSIS — J9621 Acute and chronic respiratory failure with hypoxia: Secondary | ICD-10-CM | POA: Diagnosis present

## 2020-08-18 DIAGNOSIS — D638 Anemia in other chronic diseases classified elsewhere: Secondary | ICD-10-CM | POA: Diagnosis present

## 2020-08-18 DIAGNOSIS — E43 Unspecified severe protein-calorie malnutrition: Secondary | ICD-10-CM | POA: Diagnosis present

## 2020-08-18 DIAGNOSIS — Z8249 Family history of ischemic heart disease and other diseases of the circulatory system: Secondary | ICD-10-CM

## 2020-08-18 DIAGNOSIS — Z87440 Personal history of urinary (tract) infections: Secondary | ICD-10-CM

## 2020-08-18 DIAGNOSIS — E875 Hyperkalemia: Secondary | ICD-10-CM | POA: Diagnosis present

## 2020-08-18 DIAGNOSIS — Z7189 Other specified counseling: Secondary | ICD-10-CM

## 2020-08-18 DIAGNOSIS — J9622 Acute and chronic respiratory failure with hypercapnia: Secondary | ICD-10-CM | POA: Diagnosis present

## 2020-08-18 DIAGNOSIS — Z7982 Long term (current) use of aspirin: Secondary | ICD-10-CM

## 2020-08-18 DIAGNOSIS — I2729 Other secondary pulmonary hypertension: Secondary | ICD-10-CM | POA: Diagnosis present

## 2020-08-18 DIAGNOSIS — Z87891 Personal history of nicotine dependence: Secondary | ICD-10-CM

## 2020-08-18 DIAGNOSIS — A419 Sepsis, unspecified organism: Principal | ICD-10-CM | POA: Diagnosis present

## 2020-08-18 DIAGNOSIS — Z79899 Other long term (current) drug therapy: Secondary | ICD-10-CM

## 2020-08-18 DIAGNOSIS — R627 Adult failure to thrive: Secondary | ICD-10-CM | POA: Diagnosis present

## 2020-08-18 DIAGNOSIS — Z66 Do not resuscitate: Secondary | ICD-10-CM | POA: Diagnosis present

## 2020-08-18 DIAGNOSIS — J449 Chronic obstructive pulmonary disease, unspecified: Secondary | ICD-10-CM | POA: Diagnosis present

## 2020-08-18 DIAGNOSIS — Z681 Body mass index (BMI) 19 or less, adult: Secondary | ICD-10-CM

## 2020-08-18 DIAGNOSIS — I493 Ventricular premature depolarization: Secondary | ICD-10-CM | POA: Diagnosis present

## 2020-08-18 DIAGNOSIS — J441 Chronic obstructive pulmonary disease with (acute) exacerbation: Secondary | ICD-10-CM | POA: Diagnosis present

## 2020-08-18 DIAGNOSIS — R778 Other specified abnormalities of plasma proteins: Secondary | ICD-10-CM | POA: Diagnosis present

## 2020-08-18 DIAGNOSIS — L8990 Pressure ulcer of unspecified site, unspecified stage: Secondary | ICD-10-CM | POA: Diagnosis present

## 2020-08-18 DIAGNOSIS — I2781 Cor pulmonale (chronic): Secondary | ICD-10-CM | POA: Diagnosis present

## 2020-08-18 LAB — I-STAT VENOUS BLOOD GAS, ED
Acid-Base Excess: 2 mmol/L (ref 0.0–2.0)
Bicarbonate: 27.6 mmol/L (ref 20.0–28.0)
Calcium, Ion: 1.13 mmol/L — ABNORMAL LOW (ref 1.15–1.40)
HCT: 31 % — ABNORMAL LOW (ref 36.0–46.0)
Hemoglobin: 10.5 g/dL — ABNORMAL LOW (ref 12.0–15.0)
O2 Saturation: 98 %
Potassium: 5.6 mmol/L — ABNORMAL HIGH (ref 3.5–5.1)
Sodium: 130 mmol/L — ABNORMAL LOW (ref 135–145)
TCO2: 29 mmol/L (ref 22–32)
pCO2, Ven: 49.3 mmHg (ref 44.0–60.0)
pH, Ven: 7.356 (ref 7.250–7.430)
pO2, Ven: 111 mmHg — ABNORMAL HIGH (ref 32.0–45.0)

## 2020-08-18 LAB — CBC WITH DIFFERENTIAL/PLATELET
Abs Immature Granulocytes: 0.31 10*3/uL — ABNORMAL HIGH (ref 0.00–0.07)
Basophils Absolute: 0.1 10*3/uL (ref 0.0–0.1)
Basophils Relative: 0 %
Eosinophils Absolute: 0 10*3/uL (ref 0.0–0.5)
Eosinophils Relative: 0 %
HCT: 29.4 % — ABNORMAL LOW (ref 36.0–46.0)
Hemoglobin: 9.6 g/dL — ABNORMAL LOW (ref 12.0–15.0)
Immature Granulocytes: 1 %
Lymphocytes Relative: 4 %
Lymphs Abs: 1 10*3/uL (ref 0.7–4.0)
MCH: 33 pg (ref 26.0–34.0)
MCHC: 32.7 g/dL (ref 30.0–36.0)
MCV: 101 fL — ABNORMAL HIGH (ref 80.0–100.0)
Monocytes Absolute: 1.5 10*3/uL — ABNORMAL HIGH (ref 0.1–1.0)
Monocytes Relative: 6 %
Neutro Abs: 19.8 10*3/uL — ABNORMAL HIGH (ref 1.7–7.7)
Neutrophils Relative %: 89 %
Platelets: 273 10*3/uL (ref 150–400)
RBC: 2.91 MIL/uL — ABNORMAL LOW (ref 3.87–5.11)
RDW: 15.9 % — ABNORMAL HIGH (ref 11.5–15.5)
WBC: 22.7 10*3/uL — ABNORMAL HIGH (ref 4.0–10.5)
nRBC: 0 % (ref 0.0–0.2)

## 2020-08-18 MED ORDER — ALBUTEROL (5 MG/ML) CONTINUOUS INHALATION SOLN
INHALATION_SOLUTION | RESPIRATORY_TRACT | Status: AC
  Administered 2020-08-18: 10 mg/h via RESPIRATORY_TRACT
  Filled 2020-08-18: qty 20

## 2020-08-18 MED ORDER — METHYLPREDNISOLONE SODIUM SUCC 125 MG IJ SOLR
125.0000 mg | Freq: Once | INTRAMUSCULAR | Status: AC
  Administered 2020-08-18: 125 mg via INTRAVENOUS
  Filled 2020-08-18: qty 2

## 2020-08-18 MED ORDER — VANCOMYCIN HCL 1000 MG/200ML IV SOLN
1000.0000 mg | Freq: Once | INTRAVENOUS | Status: AC
  Administered 2020-08-19: 1000 mg via INTRAVENOUS
  Filled 2020-08-18: qty 200

## 2020-08-18 MED ORDER — ALBUTEROL (5 MG/ML) CONTINUOUS INHALATION SOLN: 10.0000 mg/h | INHALATION_SOLUTION | RESPIRATORY_TRACT | Status: DC

## 2020-08-18 MED ORDER — IPRATROPIUM-ALBUTEROL 0.5-2.5 (3) MG/3ML IN SOLN
3.0000 mL | Freq: Once | RESPIRATORY_TRACT | Status: AC
  Administered 2020-08-18: 3 mL via RESPIRATORY_TRACT
  Filled 2020-08-18: qty 3

## 2020-08-18 MED ORDER — VANCOMYCIN HCL 750 MG/150ML IV SOLN: 750.0000 mg | INTRAVENOUS | Status: DC

## 2020-08-18 MED ORDER — MAGNESIUM SULFATE 2 GM/50ML IV SOLN
2.0000 g | Freq: Once | INTRAVENOUS | Status: AC
  Administered 2020-08-18: 2 g via INTRAVENOUS
  Filled 2020-08-18: qty 50

## 2020-08-18 MED ORDER — SODIUM CHLORIDE 0.9 % IV SOLN
2.0000 g | Freq: Once | INTRAVENOUS | Status: AC
  Administered 2020-08-19: 2 g via INTRAVENOUS
  Filled 2020-08-18: qty 2

## 2020-08-18 NOTE — ED Triage Notes (Signed)
Pt BIB [facility] from [facility], c/o increased shortness of breath that started today. Pt normally wears 5L [place], EMS reports pt 70% on her usual oxygen, improved to 95% on NRB. Hx covid 07/27/20

## 2020-08-18 NOTE — ED Provider Notes (Addendum)
[facility] EMERGENCY DEPARTMENT Provider Note   CSN: [id number] Arrival date & time: 08/18/20  2218     History Chief Complaint  Patient presents with  . Shortness of Breath    [name] is a 75 y.o. female.  HPI   75 year old chronically ill female with past medical history of COPD on supplemental oxygen, CAD, HTN presents the emergency department with shortness of breath.  Patient states that this started last night.  It is reported that she was hypoxic at the facility, she arrived here on a nonrebreather, did not get any prehospital treatment by EMS.  Patient denies any active chest pain, admits to increased green phlegm production when she coughs.  The facility where she came from reported that she was febrile but she denies any fever or chills.  It is documented that she was Covid positive at the beginning of February.  Past Medical History:  Diagnosis Date  . Abrasion    under left knee x 2 places changing dressing q day of bid, applying ssd cream area healing due to fell 2 weeks ago  . Blood dyscrasia    BLEEDS EASY  . Bruises easily    bleeds easily  . Closed patellar sleeve fracture of left knee 01/04/2018  . Closed patellar sleeve fracture of right knee 01/04/2018   healing   . COPD (chronic obstructive pulmonary disease) ([place])   . Coronary artery disease    per dr Philis Kendall note 01-01-18 note  . Hernia, inguinal    LEFT  . Hypertension    states under control with meds., has been on med. > 10 yr.  . On home oxygen therapy    at night 2L/min  . Pneumonia    "SEVERAL TIMES IN PAST, LAST TIME 2018"  . Pulmonary hypertension ([place])    per dr Terrence Dupont 7-19 19 lov note  . Shortness of breath dyspnea    with exertion  . SVD (spontaneous vaginal delivery)    x 1  . Thin skin   . Urinary tract infection 2019  . Wears partial dentures    lower partial and upper plate    Patient Active Problem List   Diagnosis Date Noted  . New onset  a-fib ([place]) 07/27/2020  . COVID-19 virus infection 07/27/2020  . Chest pain, musculoskeletal 02/14/2020  . Need for vaccination 10/05/2019  . Rheumatoid factor positive 09/19/2019  . 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 Protein-calorie malnutrition, severe 08/20/2019  . Severe sepsis ([place]) 08/19/2019  . Acute lower UTI 08/19/2019  . Pressure injury of skin 08/19/2019  . ARF (acute renal failure) ([place]) 08/18/2019  . Hyperkalemia 08/18/2019  . Transaminitis 08/18/2019  . Generalized weakness 08/18/2019  . Leukocytosis 08/18/2019  . Anemia 08/18/2019  . Acute renal failure (ARF) ([place]) 08/18/2019  . Chronic respiratory failure with hypoxia and hypercapnia ([place]) 06/02/2018  . COPD with acute exacerbation ([place]) 05/09/2018  . Hyponatremia 05/09/2018  . Acute urinary retention 05/09/2018  . Cystocele with prolapse 03/02/2018  . Cor pulmonale, chronic ([place]) 06/29/2017  . Chronic respiratory failure with hypoxia ([place]) 06/30/2014  . Pulmonary infiltrates 01/26/2013  . Multiple pulmonary nodules determined by computed tomography of lung 08/19/2012  . Cancer screening 05/12/2012  . Essential hypertension 03/28/2012  . COPD GOLD III 12/02/2011  . Chronic cough 12/02/2011  . Cigarette smoker 12/02/2011    Past Surgical History:  Procedure Laterality Date  . COLONOSCOPY     Less than 10 years ago per patient   .  CYSTOCELE REPAIR N/A 03/02/2018   Procedure: ANTERIOR REPAIR (CYSTOCELE);  Surgeon: Cheri Fowler, MD;  Location: [facility];  Service: Gynecology;  Laterality: N/A;  OUTPATIENT IN BED  . MULTIPLE TOOTH EXTRACTIONS    . RESECTION DISTAL CLAVICAL Left 10/12/2014   Procedure: DISTAL CLAVICLE EXCISION;  Surgeon: Kathryne Hitch, MD;  Location: [facility];  Service: Orthopedics;  Laterality: Left;  . SHOULDER ARTHROSCOPY WITH ROTATOR CUFF REPAIR AND SUBACROMIAL DECOMPRESSION Left 10/12/2014   Procedure: LEFT SHOULDER ARTHROSCOPY, DEBRIDEMENT WITH ACROMIOPLASTY,  ROTATOR CUFF  REPAIR AND RELEASE BICEPS TENDON;  Surgeon: Kathryne Hitch, MD;  Location: [facility];  Service: Orthopedics;  Laterality: Left;     OB History   No obstetric history on file.     Family History  Problem Relation Age of Onset  . Heart disease Father   . Kidney failure Father   . Colon cancer Neg Hx   . Esophageal cancer Neg Hx     Social History   Tobacco Use  . Smoking status: Former Smoker    Packs/day: 1.00    Years: 57.00    Pack years: 57.00    Types: Cigarettes    Quit date: 03/20/2017    Years since quitting: 3.4  . Smokeless tobacco: Never Used  . Tobacco comment: <1ppd 02/05/2016  Vaping Use  . Vaping Use: Never used  Substance Use Topics  . Alcohol use: Yes    Alcohol/week: 28.0 standard drinks    Types: 14 Cans of beer, 14 Shots of liquor per week    Comment: several beers/day and "a shot of whiskey"  . Drug use: No    Home Medications Prior to Admission medications   Medication Sig Start Date End Date Taking? Authorizing Provider  albuterol (PROVENTIL) (2.5 MG/3ML) 0.083% nebulizer solution Take 3 mLs (2.5 mg total) by nebulization every 6 (six) hours as needed for wheezing or shortness of breath. 06/27/20   Tanda Rockers, MD  albuterol (VENTOLIN HFA) 108 (90 Base) MCG/ACT inhaler Inhale 2 puffs into the lungs every 4 (four) hours as needed for wheezing or shortness of breath. 05/07/20   Tanda Rockers, MD  aspirin 81 MG chewable tablet Chew 1 tablet (81 mg total) by mouth daily. 08/07/20   Thurnell Lose, MD  B Complex-C (B-COMPLEX WITH VITAMIN C) tablet Take 1 tablet by mouth daily.    [provider]  bisacodyl (FLEET) 10 MG/30ML ENEM Place 10 mg rectally as needed for moderate constipation.    [provider]  budesonide (PULMICORT) 0.5 MG/2ML nebulizer solution Take 2 mLs (0.5 mg total) by nebulization in the morning and at bedtime. DX: J44.9 11/28/19   Tanda Rockers, MD  diltiazem (CARDIZEM CD) 360 MG 24 hr capsule  Take 360 mg by mouth daily.  12/05/17   [provider]  Emollient (AQUAPHOR EX) Apply topically. Apply to lower ext twice a day (avoid heels due to wounds / dressing)    [provider]  Ensure (ENSURE) Take 237 mLs by mouth in the morning and at bedtime.    [provider]  furosemide (LASIX) 40 MG tablet Take 40 mg by mouth daily.    [provider]  guaiFENesin-dextromethorphan (ROBITUSSIN DM) 100-10 MG/5ML syrup Take 10 mLs by mouth every 4 (four) hours as needed for cough. 08/07/20   Thurnell Lose, MD  HYDROcodone-acetaminophen (NORCO) 7.5-325 MG tablet Take 1 tablet by mouth every 6 (six) hours as needed for moderate pain. 08/09/20  Medina-Vargas, Monina C, NP  hydrOXYzine (ATARAX/VISTARIL) 25 MG tablet Take 12.5 mg by mouth every 8 (eight) hours as needed for nausea. 08/03/19   [provider]  ipratropium (ATROVENT HFA) 17 MCG/ACT inhaler Inhale 2 puffs into the lungs every 6 (six) hours.    [provider]  isosorbide mononitrate (IMDUR) 30 MG 24 hr tablet Take 1 tablet (30 mg total) by mouth daily. 08/07/20   Thurnell Lose, MD  Magnesium Hydroxide (MILK OF MAGNESIA PO) Take 30 mLs by mouth as needed.    [provider]  Multiple Minerals-Vitamins (CAL MAG ZINC +D3 PO) Take 1 tablet by mouth at bedtime. For supplement    [provider]  ondansetron (ZOFRAN) 4 MG tablet Take 4 mg by mouth every 6 (six) hours as needed for nausea/vomiting.    [provider]  OXYGEN Inhale 4-5 L into the lungs continuous. TO MAINTAIN O2 SATURATION OF >90% for Hypoxia    [provider]  pantoprazole (PROTONIX) 40 MG tablet Take 1 tablet (40 mg total) by mouth daily at 12 noon. 08/07/20   Thurnell Lose, MD  potassium chloride (K-DUR) 10 MEQ tablet Take 10 mEq by mouth daily. When takes lasix 10/13/17   [provider]  Respiratory Therapy Supplies (FLUTTER) DEVI Use as directed 02/05/16   Tanda Rockers, MD   salmeterol (SEREVENT) 50 MCG/DOSE diskus inhaler Inhale 1 puff into the lungs 2 (two) times daily.    [provider]  spironolactone (ALDACTONE) 25 MG tablet Take 25 mg by mouth 2 (two) times daily.    [provider]  triamcinolone (KENALOG) 0.1 % Apply 1 application topically daily as needed (skin irritation on legs).    [provider]    Allergies    Tramadol  Review of Systems   Review of Systems  Constitutional: Positive for fatigue. Negative for chills and fever.  HENT: Negative for congestion.   Eyes: Negative for visual disturbance.  Respiratory: Positive for shortness of breath and wheezing.   Cardiovascular: Negative for chest pain, palpitations and leg swelling.  Gastrointestinal: Negative for abdominal pain, diarrhea and vomiting.  Genitourinary: Negative for dysuria.  Skin: Negative for rash.  Neurological: Negative for headaches.    Physical Exam Updated Vital Signs BP (!) 147/66 (BP Location: Left Arm)   Pulse (!) 124   Temp 99.7 F (37.6 C) (Temporal)   Resp (!) 27   SpO2 92%   Physical Exam Vitals and nursing note reviewed.  Constitutional:      Appearance: Normal appearance.  HENT:     Head: Normocephalic.     Mouth/Throat:     Mouth: Mucous membranes are moist.  Cardiovascular:     Rate and Rhythm: Normal rate.  Pulmonary:     Effort: Tachypnea and accessory muscle usage present. No respiratory distress.     Breath sounds: Examination of the right-middle field reveals decreased breath sounds. Examination of the left-middle field reveals decreased breath sounds. Examination of the right-lower field reveals decreased breath sounds. Examination of the left-lower field reveals decreased breath sounds. Decreased breath sounds, wheezing and rales present.  Abdominal:     Palpations: Abdomen is soft.     Tenderness: There is no abdominal tenderness.  Musculoskeletal:     Right lower leg: No edema.     Left lower leg: No edema.   Neurological:     Mental Status: She is alert and oriented to person, place, and time. Mental status is at baseline.  Psychiatric:        Mood and Affect: Mood normal.     ED Results / Procedures / Treatments   Labs (all labs ordered are listed, but only abnormal results are displayed) Labs Reviewed  CBC WITH DIFFERENTIAL/PLATELET  COMPREHENSIVE METABOLIC PANEL  BRAIN NATRIURETIC PEPTIDE  TROPONIN I (HIGH SENSITIVITY)    EKG None  Radiology No results found.  Procedures Procedures   Medications Ordered in ED Medications  ipratropium-albuterol (DUONEB) 0.5-2.5 (3) MG/3ML nebulizer solution 3 mL (has no administration in time range)  methylPREDNISolone sodium succinate (SOLU-MEDROL) 125 mg/2 mL injection 125 mg (has no administration in time range)    ED Course  I have reviewed the triage vital signs and the nursing notes.  Pertinent labs & imaging results that were available during my care of the patient were reviewed by me and considered in my medical decision making (see chart for details).    MDM Rules/Calculators/A&P                          75 year old female presents the emergency department with shortness of breath and reported hypoxia.  She arrives on a nonrebreather, tachypneic but conversational.  Oxygenation is 95% or greater currently on a nonrebreather, she is tachycardic.  She has diminished breath sounds bilaterally with wheezes and rales.  Reportedly Covid positive a month ago.  Plan to treat patient for possible COPD exacerbation, evaluate from a cardiac/failure, pneumonia standpoint and also consider pulmonary embolism. Patient signed out to oncoming provider.  Final Clinical Impression(s) / ED Diagnoses Final diagnoses:  None    Rx / DC Orders ED Discharge Orders    None       Lorelle Gibbs, DO 08/18/20 2252    Lorelle Gibbs, DO 08/18/20 2302

## 2020-08-18 NOTE — ED Provider Notes (Signed)
I received pt in signout from Dr. Dina Rich. Briefly, pt w/ advanced COPD on 6L home O2, hospitalized last month for COVID-19 infection, p/w worsening SOB And respiratory distress as well as fevers today. At time of signout, labs and CXR ordered, pt given steroids and duoneb, on NRB.   CXR w/ infiltrates suggestive of PNA, will cover for HCAP given recent hospitalization. Ordered cultures, Vanc, and Cefepime. Pt is alert and answering questions, severely dyspneic but mentating well. Confirms that she is DNR/DNI. Ordered continuous albuterol w/ RT.   On reassessment, patient was able to transition off of nonrebreather and to 6L [place]. Improved work of breathing. VBG at presentation was reassuring against hypercarbic resp failure.  Lactate of 2.3 cleared after fluid bolus.  WBC 22.7, potassium 6.1 but may need repeat as hemolysis is possible.  Troponin 26--> 30.  Discussed admission with [facility] hospitalist, Dr. Myna Hidalgo.   CRITICAL CARE Performed by: Wenda Overland Naysha Sholl   Total critical care time: 30 minutes  Critical care time was exclusive of separately billable procedures and treating other patients.  Critical care was necessary to treat or prevent imminent or life-threatening deterioration.  Critical care was time spent personally by me on the following activities: development of treatment plan with patient and/or surrogate as well as nursing, discussions with consultants, evaluation of patient's response to treatment, examination of patient, obtaining history from patient or surrogate, ordering and performing treatments and interventions, ordering and review of laboratory studies, ordering and review of radiographic studies, pulse oximetry and re-evaluation of patient's condition.    [name], Wenda Overland, MD 08/19/20 [phone]

## 2020-08-18 NOTE — Progress Notes (Signed)
Pharmacy Antibiotic Note  [name] is a 75 y.o. female admitted on 08/18/2020 with pneumonia.  Pharmacy has been consulted for vancomycin dosing.  Plan: Vancomycin 1gm IV x 1 then 750 mg IV q24 hours F/u renal function, cultures and clinical course     Temp (24hrs), Avg:99.7 F (37.6 C), Min:99.7 F (37.6 C), Max:99.7 F (37.6 C)  No results for input(s): WBC, CREATININE, LATICACIDVEN, VANCOTROUGH, VANCOPEAK, VANCORANDOM, GENTTROUGH, GENTPEAK, GENTRANDOM, TOBRATROUGH, TOBRAPEAK, TOBRARND, AMIKACINPEAK, AMIKACINTROU, AMIKACIN in the last 168 hours.  Estimated Creatinine Clearance: 45.2 mL/min (by C-G formula based on SCr of 0.72 mg/dL).    Allergies  Allergen Reactions  . Tramadol Other (See Comments)    GI UPSET    Thank you for allowing pharmacy to be a part of this patient's care.  Excell Seltzer Poteet 08/18/2020 11:07 PM

## 2020-08-19 ENCOUNTER — Encounter (HOSPITAL_COMMUNITY): Payer: Self-pay | Admitting: Family Medicine

## 2020-08-19 DIAGNOSIS — E875 Hyperkalemia: Secondary | ICD-10-CM

## 2020-08-19 DIAGNOSIS — D75839 Thrombocytosis, unspecified: Secondary | ICD-10-CM | POA: Diagnosis present

## 2020-08-19 DIAGNOSIS — D638 Anemia in other chronic diseases classified elsewhere: Secondary | ICD-10-CM | POA: Diagnosis present

## 2020-08-19 DIAGNOSIS — R531 Weakness: Secondary | ICD-10-CM | POA: Diagnosis not present

## 2020-08-19 DIAGNOSIS — R053 Chronic cough: Secondary | ICD-10-CM

## 2020-08-19 DIAGNOSIS — I4891 Unspecified atrial fibrillation: Secondary | ICD-10-CM | POA: Diagnosis not present

## 2020-08-19 DIAGNOSIS — Z9981 Dependence on supplemental oxygen: Secondary | ICD-10-CM | POA: Diagnosis not present

## 2020-08-19 DIAGNOSIS — A419 Sepsis, unspecified organism: Secondary | ICD-10-CM | POA: Diagnosis present

## 2020-08-19 DIAGNOSIS — J9612 Chronic respiratory failure with hypercapnia: Secondary | ICD-10-CM

## 2020-08-19 DIAGNOSIS — J449 Chronic obstructive pulmonary disease, unspecified: Secondary | ICD-10-CM

## 2020-08-19 DIAGNOSIS — D539 Nutritional anemia, unspecified: Secondary | ICD-10-CM | POA: Diagnosis present

## 2020-08-19 DIAGNOSIS — Z66 Do not resuscitate: Secondary | ICD-10-CM | POA: Diagnosis present

## 2020-08-19 DIAGNOSIS — J9621 Acute and chronic respiratory failure with hypoxia: Secondary | ICD-10-CM | POA: Diagnosis present

## 2020-08-19 DIAGNOSIS — Z7189 Other specified counseling: Secondary | ICD-10-CM

## 2020-08-19 DIAGNOSIS — J9611 Chronic respiratory failure with hypoxia: Secondary | ICD-10-CM | POA: Diagnosis not present

## 2020-08-19 DIAGNOSIS — J439 Emphysema, unspecified: Secondary | ICD-10-CM | POA: Diagnosis present

## 2020-08-19 DIAGNOSIS — E43 Unspecified severe protein-calorie malnutrition: Secondary | ICD-10-CM

## 2020-08-19 DIAGNOSIS — Z972 Presence of dental prosthetic device (complete) (partial): Secondary | ICD-10-CM

## 2020-08-19 DIAGNOSIS — Z515 Encounter for palliative care: Secondary | ICD-10-CM | POA: Diagnosis not present

## 2020-08-19 DIAGNOSIS — Z789 Other specified health status: Secondary | ICD-10-CM

## 2020-08-19 DIAGNOSIS — I2729 Other secondary pulmonary hypertension: Secondary | ICD-10-CM | POA: Diagnosis present

## 2020-08-19 DIAGNOSIS — J189 Pneumonia, unspecified organism: Secondary | ICD-10-CM | POA: Diagnosis present

## 2020-08-19 DIAGNOSIS — L89622 Pressure ulcer of left heel, stage 2: Secondary | ICD-10-CM | POA: Diagnosis present

## 2020-08-19 DIAGNOSIS — Z681 Body mass index (BMI) 19 or less, adult: Secondary | ICD-10-CM | POA: Diagnosis not present

## 2020-08-19 DIAGNOSIS — I2510 Atherosclerotic heart disease of native coronary artery without angina pectoris: Secondary | ICD-10-CM | POA: Diagnosis present

## 2020-08-19 DIAGNOSIS — Z8616 Personal history of COVID-19: Secondary | ICD-10-CM | POA: Diagnosis not present

## 2020-08-19 DIAGNOSIS — I10 Essential (primary) hypertension: Secondary | ICD-10-CM | POA: Diagnosis present

## 2020-08-19 DIAGNOSIS — R778 Other specified abnormalities of plasma proteins: Secondary | ICD-10-CM

## 2020-08-19 DIAGNOSIS — R6520 Severe sepsis without septic shock: Secondary | ICD-10-CM | POA: Diagnosis present

## 2020-08-19 DIAGNOSIS — J9622 Acute and chronic respiratory failure with hypercapnia: Secondary | ICD-10-CM | POA: Diagnosis present

## 2020-08-19 DIAGNOSIS — J441 Chronic obstructive pulmonary disease with (acute) exacerbation: Secondary | ICD-10-CM | POA: Diagnosis not present

## 2020-08-19 DIAGNOSIS — I493 Ventricular premature depolarization: Secondary | ICD-10-CM | POA: Diagnosis present

## 2020-08-19 DIAGNOSIS — I480 Paroxysmal atrial fibrillation: Secondary | ICD-10-CM | POA: Diagnosis present

## 2020-08-19 DIAGNOSIS — E876 Hypokalemia: Secondary | ICD-10-CM | POA: Diagnosis not present

## 2020-08-19 DIAGNOSIS — E871 Hypo-osmolality and hyponatremia: Secondary | ICD-10-CM | POA: Diagnosis present

## 2020-08-19 DIAGNOSIS — I2781 Cor pulmonale (chronic): Secondary | ICD-10-CM

## 2020-08-19 LAB — COMPREHENSIVE METABOLIC PANEL
ALT: 20 U/L (ref 0–44)
AST: 23 U/L (ref 15–41)
Albumin: 2.3 g/dL — ABNORMAL LOW (ref 3.5–5.0)
Alkaline Phosphatase: 105 U/L (ref 38–126)
Anion gap: 12 (ref 5–15)
BUN: 30 mg/dL — ABNORMAL HIGH (ref 8–23)
CO2: 23 mmol/L (ref 22–32)
Calcium: 9 mg/dL (ref 8.9–10.3)
Chloride: 96 mmol/L — ABNORMAL LOW (ref 98–111)
Creatinine, Ser: 0.84 mg/dL (ref 0.44–1.00)
GFR, Estimated: >60 mL/min (ref >60)
Glucose, Bld: 168 mg/dL — ABNORMAL HIGH (ref 70–99)
Potassium: 6.1 mmol/L — ABNORMAL HIGH (ref 3.5–5.1)
Sodium: 131 mmol/L — ABNORMAL LOW (ref 135–145)
Total Bilirubin: 0.8 mg/dL (ref 0.3–1.2)
Total Protein: 6.2 g/dL — ABNORMAL LOW (ref 6.5–8.1)

## 2020-08-19 LAB — BASIC METABOLIC PANEL
Anion gap: 13 (ref 5–15)
BUN: 23 mg/dL (ref 8–23)
CO2: 22 mmol/L (ref 22–32)
Calcium: 8.6 mg/dL — ABNORMAL LOW (ref 8.9–10.3)
Chloride: 99 mmol/L (ref 98–111)
Creatinine, Ser: 0.59 mg/dL (ref 0.44–1.00)
GFR, Estimated: >60 mL/min (ref >60)
Glucose, Bld: 175 mg/dL — ABNORMAL HIGH (ref 70–99)
Potassium: 4.6 mmol/L (ref 3.5–5.1)
Sodium: 134 mmol/L — ABNORMAL LOW (ref 135–145)

## 2020-08-19 LAB — CBC
HCT: 29.7 % — ABNORMAL LOW (ref 36.0–46.0)
Hemoglobin: 9.3 g/dL — ABNORMAL LOW (ref 12.0–15.0)
MCH: 31.7 pg (ref 26.0–34.0)
MCHC: 31.3 g/dL (ref 30.0–36.0)
MCV: 101.4 fL — ABNORMAL HIGH (ref 80.0–100.0)
Platelets: 257 10*3/uL (ref 150–400)
RBC: 2.93 MIL/uL — ABNORMAL LOW (ref 3.87–5.11)
RDW: 15.8 % — ABNORMAL HIGH (ref 11.5–15.5)
WBC: 17.9 10*3/uL — ABNORMAL HIGH (ref 4.0–10.5)
nRBC: 0 % (ref 0.0–0.2)

## 2020-08-19 LAB — PROCALCITONIN: Procalcitonin: 8.51 ng/mL

## 2020-08-19 LAB — STREP PNEUMONIAE URINARY ANTIGEN: Strep Pneumo Urinary Antigen: NEGATIVE

## 2020-08-19 LAB — TROPONIN I (HIGH SENSITIVITY)
Troponin I (High Sensitivity): 26 ng/L — ABNORMAL HIGH (ref <18)
Troponin I (High Sensitivity): 30 ng/L — ABNORMAL HIGH (ref <18)

## 2020-08-19 LAB — LACTIC ACID, PLASMA
Lactic Acid, Venous: 2.3 mmol/L (ref 0.5–1.9)
Lactic Acid, Venous: 1.2 mmol/L (ref 0.5–1.9)

## 2020-08-19 LAB — BRAIN NATRIURETIC PEPTIDE: B Natriuretic Peptide: 294.5 pg/mL — ABNORMAL HIGH (ref 0.0–100.0)

## 2020-08-19 MED ORDER — IPRATROPIUM-ALBUTEROL 0.5-2.5 (3) MG/3ML IN SOLN
3.0000 mL | RESPIRATORY_TRACT | Status: DC
  Administered 2020-08-19 – 2020-08-23 (×25): 3 mL via RESPIRATORY_TRACT
  Filled 2020-08-19 (×25): qty 3

## 2020-08-19 MED ORDER — LORAZEPAM 2 MG/ML IJ SOLN
0.5000 mg | Freq: Once | INTRAMUSCULAR | Status: AC
  Administered 2020-08-19: 0.5 mg via INTRAVENOUS
  Filled 2020-08-19 (×2): qty 1

## 2020-08-19 MED ORDER — ARFORMOTEROL TARTRATE 15 MCG/2ML IN NEBU
15.0000 ug | INHALATION_SOLUTION | Freq: Two times a day (BID) | RESPIRATORY_TRACT | Status: DC
  Administered 2020-08-19 – 2020-08-23 (×9): 15 ug via RESPIRATORY_TRACT
  Filled 2020-08-19 (×11): qty 2

## 2020-08-19 MED ORDER — DILTIAZEM HCL 25 MG/5ML IV SOLN
10.0000 mg | Freq: Once | INTRAVENOUS | Status: AC
  Administered 2020-08-19: 10 mg via INTRAVENOUS
  Filled 2020-08-19: qty 5

## 2020-08-19 MED ORDER — DILTIAZEM HCL ER COATED BEADS 180 MG PO CP24
360.0000 mg | ORAL_CAPSULE | Freq: Every day | ORAL | Status: DC
Start: 2020-08-19 — End: 2020-08-24
  Administered 2020-08-19 – 2020-08-23 (×5): 360 mg via ORAL
  Filled 2020-08-19 (×2): qty 2
  Filled 2020-08-19: qty 1
  Filled 2020-08-19 (×2): qty 2

## 2020-08-19 MED ORDER — PANTOPRAZOLE SODIUM 40 MG PO TBEC
40.0000 mg | DELAYED_RELEASE_TABLET | Freq: Every day | ORAL | Status: DC
Start: 2020-08-19 — End: 2020-08-22
  Administered 2020-08-19 – 2020-08-22 (×4): 40 mg via ORAL
  Filled 2020-08-19 (×4): qty 1

## 2020-08-19 MED ORDER — MORPHINE SULFATE (PF) 2 MG/ML IV SOLN
2.0000 mg | INTRAVENOUS | Status: DC | PRN
  Administered 2020-08-19 – 2020-08-22 (×4): 2 mg via INTRAVENOUS
  Filled 2020-08-19 (×4): qty 1

## 2020-08-19 MED ORDER — ASPIRIN 81 MG PO CHEW
81.0000 mg | CHEWABLE_TABLET | Freq: Every day | ORAL | Status: DC
Start: 2020-08-19 — End: 2020-08-22
  Administered 2020-08-19 – 2020-08-22 (×4): 81 mg via ORAL
  Filled 2020-08-19 (×4): qty 1

## 2020-08-19 MED ORDER — IPRATROPIUM BROMIDE HFA 17 MCG/ACT IN AERS: 2.0000 | INHALATION_SPRAY | RESPIRATORY_TRACT | Status: DC | PRN

## 2020-08-19 MED ORDER — SODIUM CHLORIDE 0.9 % IV SOLN
2.0000 g | INTRAVENOUS | Status: DC
  Filled 2020-08-19: qty 2

## 2020-08-19 MED ORDER — ALBUTEROL SULFATE HFA 108 (90 BASE) MCG/ACT IN AERS
2.0000 | INHALATION_SPRAY | RESPIRATORY_TRACT | Status: DC | PRN
  Filled 2020-08-19: qty 6.7

## 2020-08-19 MED ORDER — LORAZEPAM 0.5 MG PO TABS
0.5000 mg | ORAL_TABLET | ORAL | Status: DC | PRN
  Administered 2020-08-21 – 2020-08-22 (×3): 0.5 mg via ORAL
  Filled 2020-08-19 (×3): qty 1

## 2020-08-19 MED ORDER — GUAIFENESIN-DM 100-10 MG/5ML PO SYRP
10.0000 mL | ORAL_SOLUTION | ORAL | Status: DC | PRN
  Administered 2020-08-21 – 2020-08-23 (×2): 10 mL via ORAL
  Filled 2020-08-19 (×2): qty 10

## 2020-08-19 MED ORDER — METHYLPREDNISOLONE SODIUM SUCC 125 MG IJ SOLR
80.0000 mg | Freq: Four times a day (QID) | INTRAMUSCULAR | Status: DC
  Administered 2020-08-19 – 2020-08-22 (×12): 80 mg via INTRAVENOUS
  Filled 2020-08-19 (×14): qty 2

## 2020-08-19 MED ORDER — LACTATED RINGERS IV BOLUS
1000.0000 mL | Freq: Once | INTRAVENOUS | Status: AC
  Administered 2020-08-19: 1000 mL via INTRAVENOUS

## 2020-08-19 MED ORDER — BUDESONIDE 0.5 MG/2ML IN SUSP
0.5000 mg | Freq: Two times a day (BID) | RESPIRATORY_TRACT | Status: DC
  Administered 2020-08-19 – 2020-08-23 (×10): 0.5 mg via RESPIRATORY_TRACT
  Filled 2020-08-19 (×11): qty 2

## 2020-08-19 MED ORDER — SODIUM CHLORIDE 0.9 % IV SOLN
100.0000 mg | Freq: Two times a day (BID) | INTRAVENOUS | Status: DC
  Administered 2020-08-19 – 2020-08-20 (×2): 100 mg via INTRAVENOUS
  Filled 2020-08-19 (×5): qty 100

## 2020-08-19 MED ORDER — IPRATROPIUM-ALBUTEROL 0.5-2.5 (3) MG/3ML IN SOLN: 3.0000 mL | RESPIRATORY_TRACT | Status: DC

## 2020-08-19 MED ORDER — HYDROCODONE-ACETAMINOPHEN 7.5-325 MG PO TABS
1.0000 | ORAL_TABLET | Freq: Four times a day (QID) | ORAL | Status: DC | PRN
  Administered 2020-08-19 – 2020-08-22 (×7): 1 via ORAL
  Filled 2020-08-19 (×7): qty 1

## 2020-08-19 MED ORDER — SODIUM CHLORIDE 0.9 % IV SOLN
500.0000 mg | INTRAVENOUS | Status: DC
  Administered 2020-08-19: 500 mg via INTRAVENOUS
  Filled 2020-08-19 (×2): qty 500

## 2020-08-19 MED ORDER — ISOSORBIDE MONONITRATE ER 30 MG PO TB24
30.0000 mg | ORAL_TABLET | Freq: Every day | ORAL | Status: DC
  Administered 2020-08-19 – 2020-08-22 (×4): 30 mg via ORAL
  Filled 2020-08-19 (×4): qty 1

## 2020-08-19 MED ORDER — ENOXAPARIN SODIUM 30 MG/0.3ML ~~LOC~~ SOLN
30.0000 mg | SUBCUTANEOUS | Status: DC
  Administered 2020-08-19 – 2020-08-22 (×4): 30 mg via SUBCUTANEOUS
  Filled 2020-08-19 (×4): qty 0.3

## 2020-08-19 MED ORDER — FUROSEMIDE 40 MG PO TABS
40.0000 mg | ORAL_TABLET | Freq: Every day | ORAL | Status: DC
  Administered 2020-08-19 – 2020-08-23 (×5): 40 mg via ORAL
  Filled 2020-08-19 (×3): qty 1
  Filled 2020-08-19: qty 2
  Filled 2020-08-19: qty 1

## 2020-08-19 NOTE — ED Notes (Signed)
Lunch Tray Ordered @ 1004. 

## 2020-08-19 NOTE — Consult Note (Incomplete)
Consultation Note Date: 08/19/2020   Patient Name: [name]  DOB: 1945/12/08  MRN: [id number]  Age / Sex: 75 y.o., female  PCP: Crystal Sacramento, MD Referring Physician: Murlean Iba, MD  Reason for Consultation: Establishing goals of care and Non pain symptom management, and "End stage respiratory failure"  HPI/Patient Profile: 75 y.o. female  with past medical history of end stage COPD, atrial fibrillation, failure to thrive, pulmonary hypertension, CAD, and severe protein calorie malnutrition presented to the ED on 08/18/20 from [facility] rehab facility with complaints of shortness of breath. She was admitted on 08/18/2020 with sepsis secondary to pneumonia, acute on chronic hypoxic respiratory failure, atrial fibrillation with RVR.   PMT worked with patient during her previous admission from 2/11-2/22/22. She was hospice appropriate at that time; however, patient needs 24/7 care and does not have family/friends able to provide the care she needed at home to discharge with hospice at that time.   ED Course: Upon arrival to the ED, patient is found to be afebrile, saturating low to mid 90s on 6 L/min of supplemental oxygen, mildly tachypneic, tachycardic in the 120s, and with blood pressure 118/63.  EKG features sinus tachycardia 322 and PVC.  Chest x-ray is concerning for severe emphysema and left-sided pneumonia.  Chemistry panel notable for sodium 131, potassium 6.1, and elevated BUN to creatinine ratio.  CBC features a leukocytosis to 22,700 and a macrocytic anemia with hemoglobin 9.6.  Initial lactic acid is 2.3.  High-sensitivity troponin was 26 and then 30.  BNP is similar to priors at 295.  Blood cultures were collected and the patient was treated with 125 mg IV Solu-Medrol, 1 L of LR, cefepime, vancomycin, IV magnesium, and DuoNeb.  Clinical Assessment and Goals of Care: I have reviewed medical  records including EPIC notes, labs, and imaging. Received report from primary RN - no acute concerns.    Went to visit patient at bedside - no family/visitors present. Patient was lying in bed awake, alert, oriented, and able to participate in conversation. No signs or non-verbal gestures of pain or discomfort noted. No respiratory distress or secretions noted; she does have significant increased work of breathing as she talks. She is on 5L O2 [place]. When asked how she feels, she states "bad, bad, bad."  Met with patient  to discuss diagnosis, prognosis, GOC, EOL wishes, disposition, and options.  Patient is not safe to live alone in her frail and severely deconditioned state. She will need ATC supervision and assistance-unless family can meet that need or hire private duty caregivers -otherwise she will need LTC or ALF level care.   Patient is familiar to PMT service. I re-introduced Palliative Medicine as specialized medical care for people living with serious illness. It focuses on providing relief from the symptoms and stress of a serious illness. The goal is to improve quality of life for both the patient and the family.  We discussed interval history since her last admission. [name] states that she has not gained any benefit  from being at rehab - stating "it's not going well. I can't get up. I'm still weak and can't breath."   We discussed again patient's current illness and what it means in the larger context of patient's on-going co-morbidities. [name] understands that COPD is a progressive, non-curable disease underlying the patient's current acute medical conditions. Natural disease trajectory and expectations at EOL were discussed. I attempted to elicit values and goals of care important to the patient. The difference between aggressive medical intervention and comfort care was considered in light of the patient's goals of care. The patient understands that she is hospice appropriate,  but also understands that she is not safe to live alone in her frail and severely deconditioned state. She needs 24/7 supervision and assistance. During last admission, unfortunately, she had no friends or family that could provide this care for her. We discussed again getting her support at home so we could discharge her with hospice, with the goal to improve her symptoms and quality of life. She understands without hospice support, she is at high risk of rehospitalization.   Hospice and Palliative Care services outpatient were explained and offered.  Advance directives, concepts specific to code status, artificial feeding and hydration, and rehospitalization were considered and discussed.  Visit also consisted of discussions dealing with the complex and emotionally intense issues of symptom management and palliative care in the setting of serious and potentially life-threatening illness. Palliative care team will continue to support patient, patient's family, and medical team.  Discussed with patient/family the importance of continued conversation with each other*** and the medical providers regarding overall plan of care and treatment options, ensuring decisions are within the context of the patient's values and GOCs.    Questions and concerns were addressed. The patient/family was encouraged to call with questions and/or concerns. PMT card was provided.    Primary Decision Maker: {Primary Decision YIFOY:77412}    SUMMARY OF RECOMMENDATIONS    Chaplain for HCPOA  TOC for info on home health aids to [name]  Morphine 72m IV q4hr PRN pain/dyspnea   Code Status/Advance Care Planning:  {Palliative Code status:23503}    Symptom Management:   ***  Palliative Prophylaxis:   {Palliative Prophylaxis:21015}  Additional Recommendations (Limitations, Scope, Preferences):  {Recommended Scope and Preferences:21019}  Psycho-social/Spiritual:   Desire for further Chaplaincy  support:{YES NO:22349}  Additional Recommendations: {PAL SOCIAL:21064}  Prognosis:   {Palliative Care Prognosis:23504}  Discharge Planning: {Palliative dispostion:23505}      Primary Diagnoses: Present on Admission: . S[place] due to [place] ([place] . Acute on chronic respiratory failure with hypoxia ([place] . Hyponatremia . Hyperkalemia . COPD GOLD III . Elevated troponin . Cor pulmonale, chronic ([place] . Atrial fibrillation with RVR ([place] . Macrocytic anemia . DNR (do not resuscitate) . Protein-calorie malnutrition, severe . Essential hypertension . COPD with acute exacerbation ([place] . Chronic cough   I have reviewed the medical record, interviewed the patient and family, and examined the patient. The following aspects are pertinent.  Past Medical History:  Diagnosis Date  . Abrasion    under left knee x 2 places changing dressing q day of bid, applying ssd cream area healing due to fell 2 weeks ago  . Blood dyscrasia    BLEEDS EASY  . Bruises easily    bleeds easily  . Closed patellar sleeve fracture of left knee 01/04/2018  . Closed patellar sleeve fracture of right knee 01/04/2018   healing   . COPD (chronic obstructive pulmonary disease) ([place]   .  Coronary artery disease    per dr Philis Kendall note 01-01-18 note  . Hernia, inguinal    LEFT  . Hypertension    states under control with meds., has been on med. > 10 yr.  . On home oxygen therapy    at night 2L/min  . Pneumonia    "SEVERAL TIMES IN PAST, LAST TIME 2018"  . Pulmonary hypertension ([place])    per dr Terrence Dupont 7-19 19 lov note  . Shortness of breath dyspnea    with exertion  . SVD (spontaneous vaginal delivery)    x 1  . Thin skin   . Urinary tract infection 2019  . Wears partial dentures    lower partial and upper plate   Social History   Socioeconomic History  . Marital status: Widowed    Spouse name: Not on file  . Number of children: 1  . Years of education: Not on file  . Highest  education level: Not on file  Occupational History  . Occupation: weaver  Tobacco Use  . Smoking status: Former Smoker    Packs/day: 1.00    Years: 57.00    Pack years: 57.00    Types: Cigarettes    Quit date: 03/20/2017    Years since quitting: 3.4  . Smokeless tobacco: Never Used  . Tobacco comment: <1ppd 02/05/2016  Vaping Use  . Vaping Use: Never used  Substance and Sexual Activity  . Alcohol use: Yes    Alcohol/week: 28.0 standard drinks    Types: 14 Cans of beer, 14 Shots of liquor per week    Comment: several beers/day and "a shot of whiskey"  . Drug use: No  . Sexual activity: Not Currently    Birth control/protection: Post-menopausal  Other Topics Concern  . Not on file  Social History Narrative  . Not on file   Social Determinants of Health   Financial Resource Strain: Not on file  Food Insecurity: Not on file  Transportation Needs: Not on file  Physical Activity: Not on file  Stress: Not on file  Social Connections: Not on file   Family History  Problem Relation Age of Onset  . Heart disease Father   . Kidney failure Father   . Colon cancer Neg Hx   . Esophageal cancer Neg Hx    Scheduled Meds: . arformoterol  15 mcg Nebulization BID  . aspirin  81 mg Oral Daily  . budesonide  0.5 mg Nebulization BID  . diltiazem  360 mg Oral Daily  . enoxaparin (LOVENOX) injection  30 mg Subcutaneous Q24H  . furosemide  40 mg Oral Daily  . ipratropium-albuterol  3 mL Nebulization Q4H  . isosorbide mononitrate  30 mg Oral Daily  . methylPREDNISolone (SOLU-MEDROL) injection  80 mg Intravenous Q6H  . pantoprazole  40 mg Oral Q1200   Continuous Infusions: . doxycycline (VIBRAMYCIN) IV     PRN Meds:.albuterol, guaiFENesin-dextromethorphan, HYDROcodone-acetaminophen Medications Prior to Admission:  Prior to Admission medications   Medication Sig Start Date End Date Taking? Authorizing Provider  acetaminophen (TYLENOL) 325 MG tablet Take 650 mg by mouth every 6 (six)  hours as needed for mild pain or headache.   Yes [provider]  albuterol (PROVENTIL) (2.5 MG/3ML) 0.083% nebulizer solution Take 3 mLs (2.5 mg total) by nebulization every 6 (six) hours as needed for wheezing or shortness of breath. 06/27/20  Yes Tanda Rockers, MD  albuterol (VENTOLIN HFA) 108 (90 Base) MCG/ACT inhaler Inhale 2 puffs into the lungs every  4 (four) hours as needed for wheezing or shortness of breath. 05/07/20  Yes Tanda Rockers, MD  aspirin 81 MG chewable tablet Chew 1 tablet (81 mg total) by mouth daily. 08/07/20  Yes Thurnell Lose, MD  B Complex-C (B-COMPLEX WITH VITAMIN C) tablet Take 1 tablet by mouth daily.   Yes [provider]  bisacodyl (FLEET) 10 MG/30ML ENEM Place 10 mg rectally as needed for moderate constipation.   Yes [provider]  budesonide (PULMICORT) 0.5 MG/2ML nebulizer solution Take 2 mLs (0.5 mg total) by nebulization in the morning and at bedtime. DX: J44.9 11/28/19  Yes Tanda Rockers, MD  diltiazem (CARDIZEM CD) 360 MG 24 hr capsule Take 360 mg by mouth daily.  12/05/17  Yes [provider]  Emollient (AQUAPHOR EX) Apply topically. Apply to lower ext twice a day (avoid heels due to wounds / dressing)   Yes [provider]  Ensure (ENSURE) Take 237 mLs by mouth in the morning and at bedtime.   Yes [provider]  furosemide (LASIX) 40 MG tablet Take 40 mg by mouth daily.   Yes [provider]  guaiFENesin-dextromethorphan (ROBITUSSIN DM) 100-10 MG/5ML syrup Take 10 mLs by mouth every 4 (four) hours as needed for cough. 08/07/20  Yes Thurnell Lose, MD  HYDROcodone-acetaminophen (NORCO) 7.5-325 MG tablet Take 1 tablet by mouth every 6 (six) hours as needed for moderate pain. 08/09/20  Yes Medina-Vargas, Monina C, NP  hydrOXYzine (ATARAX/VISTARIL) 25 MG tablet Take 12.5 mg by mouth every 8 (eight) hours as needed for nausea. 08/03/19  Yes [provider]  ipratropium (ATROVENT HFA) 17  MCG/ACT inhaler Inhale 2 puffs into the lungs every 6 (six) hours as needed for wheezing.   Yes [provider]  isosorbide mononitrate (IMDUR) 30 MG 24 hr tablet Take 1 tablet (30 mg total) by mouth daily. 08/07/20  Yes Thurnell Lose, MD  Magnesium Hydroxide (MILK OF MAGNESIA PO) Take 30 mLs by mouth as needed (constipation).   Yes [provider]  Multiple Minerals-Vitamins (CAL MAG ZINC +D3 PO) Take 1 tablet by mouth at bedtime. For supplement   Yes [provider]  ondansetron (ZOFRAN) 4 MG tablet Take 4 mg by mouth every 6 (six) hours as needed for nausea/vomiting.   Yes [provider]  OXYGEN Inhale 4-5 L into the lungs continuous. TO MAINTAIN O2 SATURATION OF >90% for Hypoxia   Yes [provider]  pantoprazole (PROTONIX) 40 MG tablet Take 1 tablet (40 mg total) by mouth daily at 12 noon. 08/07/20  Yes Thurnell Lose, MD  potassium chloride (K-DUR) 10 MEQ tablet Take 10 mEq by mouth daily. When takes lasix 10/13/17  Yes [provider]  Respiratory Therapy Supplies (FLUTTER) DEVI Use as directed 02/05/16  Yes Tanda Rockers, MD  salmeterol (SEREVENT) 50 MCG/DOSE diskus inhaler Inhale 1 puff into the lungs 2 (two) times daily.   Yes [provider]  spironolactone (ALDACTONE) 25 MG tablet Take 25 mg by mouth 2 (two) times daily.   Yes [provider]  triamcinolone (KENALOG) 0.1 % Apply 1 application topically daily as needed (skin irritation on legs).   Yes [provider]   Allergies  Allergen Reactions  . Tramadol Other (See Comments)    GI UPSET   Review of Systems  Physical Exam  Vital Signs: BP 99/60 (BP Location: Left Arm)   Pulse 91   Temp 97.9 F (36.6 C)   Resp 17  SpO2 93%  Pain Scale: 0-10   Pain Score: 10-Worst pain ever   SpO2: SpO2: 93 % O2 Device:SpO2: 93 % O2 Flow Rate: .O2 Flow Rate (L/min): 5 L/min  IO: Intake/output summary: No intake or output data in the 24 hours  ending 08/19/20 1615  LBM:   Baseline Weight:   Most recent weight:       Palliative Assessment/Data:     Time In: *** Time Out: *** Time Total: *** Greater than 50%  of this time was spent counseling and coordinating care related to the above assessment and plan.  Signed by: Lin Landsman, NP   Please contact Palliative Medicine Team phone at [phone] for questions and concerns.  For individual provider: See Shea Evans

## 2020-08-19 NOTE — ED Notes (Signed)
Pt placed on 6L [place].

## 2020-08-19 NOTE — Progress Notes (Signed)
Pharmacy Antibiotic Note  [name] is a 75 y.o. female admitted on 08/18/2020 with pneumonia.  Pharmacy has been consulted for cefepime dosing. Cefepime 2gm given in ED Plan: Cont cefepime 2gm IV q24 hours F/u renal function, cultures and clinical course    Temp (24hrs), Avg:99.7 F (37.6 C), Min:99.7 F (37.6 C), Max:99.7 F (37.6 C)  Recent Labs  Lab 08/18/20 2249 08/18/20 2302 08/19/20 0102  WBC 22.7*  --   --   CREATININE 0.84  --   --   LATICACIDVEN  --  2.3* 1.2    Estimated Creatinine Clearance: 43 mL/min (by C-G formula based on SCr of 0.84 mg/dL).    Allergies  Allergen Reactions  . Tramadol Other (See Comments)    GI UPSET   Thank you for allowing pharmacy to be a part of this patient's care.  Saira Kramme, [place] 08/19/2020 3:19 AM

## 2020-08-19 NOTE — Progress Notes (Signed)
Manufacturing engineer ([facility]) Community Based Palliative Care       This patient has been referred to our palliative care services in the community but was readmitted before she could be admitted onto services. [facility] will continue to follow for any discharge planning needs and to coordinate admission onto palliative care.    Thank you for the opportunity to participate in this patient's care.     Domenic Moras, BSN, RN [facility] Liaison [phone]   [phone] (24h on call)

## 2020-08-19 NOTE — ED Notes (Signed)
Heart monitor reading irregular HR with RVR. EKG taken. MD paged

## 2020-08-19 NOTE — H&P (Signed)
History and Physical    [name] FYT:[id number] DOB: 09/26/1945 DOA: 08/18/2020  PCP: Christain Sacramento, MD   Patient coming from: SNF   Chief Complaint: Productive cough, fever, increase oxygen requirement   HPI: [name] is a 75 y.o. female with medical history significant for alcohol abuse, COPD, chronic 4-5 L/min supplemental oxygen requirement, pulmonary hypertension, atrial fibrillation not anticoagulated due to bleeding risk, and admission for COVID-19 infection and COPD exacerbation last month, now presenting to the emergency department for evaluation of productive cough, increased supplemental oxygen requirement, and fever.  Patient was positive for COVID-19 on 07/27/2020, was discharged to SNF recently, has had increased cough productive of thick green sputum, was reported to be febrile at the nursing facility, and was saturating 70% on 5 L/min of supplemental oxygen.  She was placed on nonrebreather and transported to the ED.  She denies any associated chest pain, denies any leg swelling or tenderness, and does not feel that she has been wheezing.  ED Course: Upon arrival to the ED, patient is found to be afebrile, saturating low to mid 90s on 6 L/min of supplemental oxygen, mildly tachypneic, tachycardic in the 120s, and with blood pressure 118/63.  EKG features sinus tachycardia 322 and PVC.  Chest x-ray is concerning for severe emphysema and left-sided pneumonia.  Chemistry panel notable for sodium 131, potassium 6.1, and elevated BUN to creatinine ratio.  CBC features a leukocytosis to 22,700 and a macrocytic anemia with hemoglobin 9.6.  Initial lactic acid is 2.3.  High-sensitivity troponin was 26 and then 30.  BNP is similar to priors at 295.  Blood cultures were collected and the patient was treated with 125 mg IV Solu-Medrol, 1 L of LR, cefepime, vancomycin, IV magnesium, and DuoNeb.  Review of Systems:  All other systems reviewed and apart from HPI, are negative.  Past  Medical History:  Diagnosis Date  . Abrasion    under left knee x 2 places changing dressing q day of bid, applying ssd cream area healing due to fell 2 weeks ago  . Blood dyscrasia    BLEEDS EASY  . Bruises easily    bleeds easily  . Closed patellar sleeve fracture of left knee 01/04/2018  . Closed patellar sleeve fracture of right knee 01/04/2018   healing   . COPD (chronic obstructive pulmonary disease) ([place])   . Coronary artery disease    per dr Philis Kendall note 01-01-18 note  . Hernia, inguinal    LEFT  . Hypertension    states under control with meds., has been on med. > 10 yr.  . On home oxygen therapy    at night 2L/min  . Pneumonia    "SEVERAL TIMES IN PAST, LAST TIME 2018"  . Pulmonary hypertension ([place])    per dr Terrence Dupont 7-19 19 lov note  . Shortness of breath dyspnea    with exertion  . SVD (spontaneous vaginal delivery)    x 1  . Thin skin   . Urinary tract infection 2019  . Wears partial dentures    lower partial and upper plate    Past Surgical History:  Procedure Laterality Date  . COLONOSCOPY     Less than 10 years ago per patient   . CYSTOCELE REPAIR N/A 03/02/2018   Procedure: ANTERIOR REPAIR (CYSTOCELE);  Surgeon: Cheri Fowler, MD;  Location: [facility];  Service: Gynecology;  Laterality: N/A;  OUTPATIENT IN BED  . MULTIPLE TOOTH EXTRACTIONS    . RESECTION DISTAL  CLAVICAL Left 10/12/2014   Procedure: DISTAL CLAVICLE EXCISION;  Surgeon: Kathryne Hitch, MD;  Location: [facility];  Service: Orthopedics;  Laterality: Left;  . SHOULDER ARTHROSCOPY WITH ROTATOR CUFF REPAIR AND SUBACROMIAL DECOMPRESSION Left 10/12/2014   Procedure: LEFT SHOULDER ARTHROSCOPY, DEBRIDEMENT WITH ACROMIOPLASTY,  ROTATOR CUFF REPAIR AND RELEASE BICEPS TENDON;  Surgeon: Kathryne Hitch, MD;  Location: [facility];  Service: Orthopedics;  Laterality: Left;    Social History:   reports that she quit smoking about 3 years ago. Her smoking use included  cigarettes. She has a 57.00 pack-year smoking history. She has never used smokeless tobacco. She reports current alcohol use of about 28.0 standard drinks of alcohol per week. She reports that she does not use drugs.  Allergies  Allergen Reactions  . Tramadol Other (See Comments)    GI UPSET    Family History  Problem Relation Age of Onset  . Heart disease Father   . Kidney failure Father   . Colon cancer Neg Hx   . Esophageal cancer Neg Hx      Prior to Admission medications   Medication Sig Start Date End Date Taking? Authorizing Provider  acetaminophen (TYLENOL) 325 MG tablet Take 650 mg by mouth every 6 (six) hours as needed for mild pain or headache.   Yes [provider]  albuterol (PROVENTIL) (2.5 MG/3ML) 0.083% nebulizer solution Take 3 mLs (2.5 mg total) by nebulization every 6 (six) hours as needed for wheezing or shortness of breath. 06/27/20  Yes Tanda Rockers, MD  albuterol (VENTOLIN HFA) 108 (90 Base) MCG/ACT inhaler Inhale 2 puffs into the lungs every 4 (four) hours as needed for wheezing or shortness of breath. 05/07/20  Yes Tanda Rockers, MD  aspirin 81 MG chewable tablet Chew 1 tablet (81 mg total) by mouth daily. 08/07/20  Yes Thurnell Lose, MD  B Complex-C (B-COMPLEX WITH VITAMIN C) tablet Take 1 tablet by mouth daily.   Yes [provider]  bisacodyl (FLEET) 10 MG/30ML ENEM Place 10 mg rectally as needed for moderate constipation.   Yes [provider]  budesonide (PULMICORT) 0.5 MG/2ML nebulizer solution Take 2 mLs (0.5 mg total) by nebulization in the morning and at bedtime. DX: J44.9 11/28/19  Yes Tanda Rockers, MD  diltiazem (CARDIZEM CD) 360 MG 24 hr capsule Take 360 mg by mouth daily.  12/05/17  Yes [provider]  Emollient (AQUAPHOR EX) Apply topically. Apply to lower ext twice a day (avoid heels due to wounds / dressing)   Yes [provider]  Ensure (ENSURE) Take 237 mLs by mouth in the morning and at  bedtime.   Yes [provider]  furosemide (LASIX) 40 MG tablet Take 40 mg by mouth daily.   Yes [provider]  guaiFENesin-dextromethorphan (ROBITUSSIN DM) 100-10 MG/5ML syrup Take 10 mLs by mouth every 4 (four) hours as needed for cough. 08/07/20  Yes Thurnell Lose, MD  HYDROcodone-acetaminophen (NORCO) 7.5-325 MG tablet Take 1 tablet by mouth every 6 (six) hours as needed for moderate pain. 08/09/20  Yes Medina-Vargas, Monina C, NP  hydrOXYzine (ATARAX/VISTARIL) 25 MG tablet Take 12.5 mg by mouth every 8 (eight) hours as needed for nausea. 08/03/19  Yes [provider]  ipratropium (ATROVENT HFA) 17 MCG/ACT inhaler Inhale 2 puffs into the lungs every 6 (six) hours as needed for wheezing.   Yes [provider]  isosorbide mononitrate (IMDUR) 30 MG 24 hr tablet Take 1 tablet (30 mg total)  by mouth daily. 08/07/20  Yes Thurnell Lose, MD  Magnesium Hydroxide (MILK OF MAGNESIA PO) Take 30 mLs by mouth as needed (constipation).   Yes [provider]  Multiple Minerals-Vitamins (CAL MAG ZINC +D3 PO) Take 1 tablet by mouth at bedtime. For supplement   Yes [provider]  ondansetron (ZOFRAN) 4 MG tablet Take 4 mg by mouth every 6 (six) hours as needed for nausea/vomiting.   Yes [provider]  OXYGEN Inhale 4-5 L into the lungs continuous. TO MAINTAIN O2 SATURATION OF >90% for Hypoxia   Yes [provider]  pantoprazole (PROTONIX) 40 MG tablet Take 1 tablet (40 mg total) by mouth daily at 12 noon. 08/07/20  Yes Thurnell Lose, MD  potassium chloride (K-DUR) 10 MEQ tablet Take 10 mEq by mouth daily. When takes lasix 10/13/17  Yes [provider]  Respiratory Therapy Supplies (FLUTTER) DEVI Use as directed 02/05/16  Yes Tanda Rockers, MD  salmeterol (SEREVENT) 50 MCG/DOSE diskus inhaler Inhale 1 puff into the lungs 2 (two) times daily.   Yes [provider]  spironolactone (ALDACTONE) 25 MG tablet Take 25 mg  by mouth 2 (two) times daily.   Yes [provider]  triamcinolone (KENALOG) 0.1 % Apply 1 application topically daily as needed (skin irritation on legs).   Yes [provider]    Physical Exam: Vitals:   08/19/20 0145 08/19/20 0200 08/19/20 0215 08/19/20 0230  BP: (!) 125/59 122/61 118/63 122/64  Pulse: 91 89 88 89  Resp: _0 Temp:      TempSrc:      SpO2: 100% 100% 100% 96%    Constitutional: NAD, calm  Eyes: PERTLA, lids and conjunctivae normal ENMT: Mucous membranes are moist. Posterior pharynx clear of any exudate or lesions.   Neck: normal, supple, no masses, no thyromegaly Respiratory: Rhonchi on left, diminished bilaterally, no wheezing. No pallor or cyanosis.  Cardiovascular: Rate ~120 and irregularly irregular. No extremity edema.  Abdomen: No distension, no tenderness, soft. Bowel sounds active.  Musculoskeletal: no clubbing / cyanosis. No joint deformity upper and lower extremities.   Skin: Eczematous dermatitis with fissuring. Warm, dry, well-perfused. Neurologic: CN 2-12 grossly intact. Sensation intact. Moving all extremities.  Psychiatric: Alert and oriented to person, place, and situation. Pleasant and cooperative.    Labs and Imaging on Admission: I have personally reviewed following labs and imaging studies  CBC: Recent Labs  Lab 08/18/20 2249 08/18/20 2327  WBC 22.7*  --   NEUTROABS 19.8*  --   HGB 9.6* 10.5*  HCT 29.4* 31.0*  MCV 101.0*  --   PLT 273  --    Basic Metabolic Panel: Recent Labs  Lab 08/18/20 2249 08/18/20 2327  NA 131* 130*  K 6.1* 5.6*  CL 96*  --   CO2 23  --   GLUCOSE 168*  --   BUN 30*  --   CREATININE 0.84  --   CALCIUM 9.0  --    GFR: Estimated Creatinine Clearance: 43 mL/min (by C-G formula based on SCr of 0.84 mg/dL). Liver Function Tests: Recent Labs  Lab 08/18/20 2249  AST 23  ALT 20  ALKPHOS 105  BILITOT 0.8  PROT 6.2*  ALBUMIN 2.3*   No results for input(s): LIPASE, AMYLASE  in the last 168 hours. No results for input(s): AMMONIA in the last 168 hours. Coagulation Profile: No results for input(s): INR, PROTIME in the last 168 hours. Cardiac Enzymes: No results for  input(s): CKTOTAL, CKMB, CKMBINDEX, TROPONINI in the last 168 hours. BNP (last 3 results) No results for input(s): PROBNP in the last 8760 hours. HbA1C: No results for input(s): HGBA1C in the last 72 hours. CBG: No results for input(s): GLUCAP in the last 168 hours. Lipid Profile: No results for input(s): CHOL, HDL, LDLCALC, TRIG, CHOLHDL, LDLDIRECT in the last 72 hours. Thyroid Function Tests: No results for input(s): TSH, T4TOTAL, FREET4, T3FREE, THYROIDAB in the last 72 hours. Anemia Panel: No results for input(s): VITAMINB12, FOLATE, FERRITIN, TIBC, IRON, RETICCTPCT in the last 72 hours. Urine analysis:    Component Value Date/Time   COLORURINE YELLOW 08/18/2019 2150   APPEARANCEUR CLOUDY (A) 08/18/2019 2150   LABSPEC 1.018 08/18/2019 2150   PHURINE 5.0 08/18/2019 2150   GLUCOSEU NEGATIVE 08/18/2019 2150   HGBUR NEGATIVE 08/18/2019 2150   BILIRUBINUR NEGATIVE 08/18/2019 2150   [place] NEGATIVE 08/18/2019 2150   PROTEINUR NEGATIVE 08/18/2019 2150   NITRITE NEGATIVE 08/18/2019 2150   LEUKOCYTESUR MODERATE (A) 08/18/2019 2150   Sepsis Labs: _0 (procalcitonin:4,lacticidven:4) )No results found for this or any previous visit (from the past 240 hour(s)).   Radiological Exams on Admission: DG Chest Port 1 View  Result Date: 08/18/2020 CLINICAL DATA:  Shortness of breath. EXAM: PORTABLE CHEST 1 VIEW COMPARISON:  Radiograph 08/01/2020.  CT 01/16/2020 FINDINGS: Extensive patchy airspace disease in the left mid lower lung zone. Streaky opacities in the right infrahilar lung. Slightly spiculated appearance in the right perihilar region. Advanced underlying emphysema. Normal heart size. No pneumothorax or large pleural effusion. The bones are subjectively under mineralized. IMPRESSION:  Extensive patchy airspace disease in the left mid and lower lung zone, suspicious for pneumonia. Streaky right infrahilar opacities may be atelectasis or infection. Recommend radiographic follow-up to resolution. Advanced emphysema. Electronically Signed   By: Keith Rake M.D.   On: 08/18/2020 23:10    EKG: Independently reviewed. Sinus tachycardia, rate 122, PVC.   Assessment/Plan   1. Sepsis secondary to PNA; acute on chronic hypoxic respiratory failure  - Presents with productive cough, fever, and increased supplemental O2 requirement and is found to have increased leukocytosis, tachypnea, and CXR findings worrisome for PNA  - Blood cultures collected in ED and she was treated with 1 liter LR, cefepime, and vancomycin  - Check sputum culture, check strep pneumo and legionella antigens, check MRSA pcr, trend procalcitonin, continue cefepime and vancomycin for now, and start azithromycin   2. Atrial fibrillation with RVR  - Was in sinus rhythm initially, then atrial fib with rate 110s-130s - Not anticoagulated due to bleeding risk  - Treat with 10 mg IV diltiazem now, continue oral diltiazem, continue cardiac monitoring    3. Pulmonary hypertension  - Appears stable  - Continue Lasix, hold Aldactone in light of hyperkalemia    4. Hyperkalemia  - Serum potassium is 6.1 in ED  - Hold Aldactone and potassium supplement, continue Lasix, continue cardiac monitoring, repeat chem panel    5. COPD  - Severe emphysema with chronic respiratory failure  - No wheezing on admission   - Continue Pulmicort, Servent, Atrovent, and albuterol   6. Macrocytic anemia  - Hgb is stable, will monitor    7. Elevated troponin  - HS troponin mildly elevated, stable on repeat, and without angina  - Likely demand ischemia in setting of pneumonia with acute on chronic respiratory failure     DVT prophylaxis: Lovenox  Code Status: DNR, confirmed with patient  Level of Care: Level of care: Telemetry  Medical  Family Communication: Significant other updated at bedside Disposition Plan:  Patient is from: SNF  Anticipated d/c is to: SNF  Anticipated d/c date is: 08/23/20 Patient currently: In a fib with RVR, requiring increased FiO2 due to PNA  Consults called: none  Admission status: Inpatient     Vianne Bulls, MD [facility]  08/19/2020, 3:06 AM
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
## 2020-08-19 NOTE — Progress Notes (Signed)
ASSUMPTION OF CARE NOTE   08/19/2020 9:56 AM  [name] was seen and examined.  The H&P by the admitting provider, orders, imaging was reviewed.  Please see new orders.  Will continue to follow.  Added IV steroids, scheduled nebs, also added palliative care consult as patient is clearly suffering and at end stages of chronic respiratory failure.  May need palliative symptom management in setting of uncontrolled dyspnea and hypoxia.  Pt remains DNR/DNI.    Vitals:   08/19/20 0800 08/19/20 0900  BP: 110/72 110/61  Pulse: 91 95  Resp: (!) 25 18  Temp:    SpO2: 96% 97%    Results for orders placed or performed during the hospital encounter of 08/18/20  Culture, blood (routine x 2)   Specimen: BLOOD RIGHT FOREARM  Result Value Ref Range   Specimen Description BLOOD RIGHT FOREARM    Special Requests      BOTTLES DRAWN AEROBIC AND ANAEROBIC Blood Culture adequate volume   Culture      NO GROWTH < 12 HOURS Performed at [facility] [address] [place], [place] [address]    Report Status PENDING   Culture, blood (routine x 2)   Specimen: BLOOD LEFT HAND  Result Value Ref Range   Specimen Description BLOOD LEFT HAND    Special Requests      BOTTLES DRAWN AEROBIC AND ANAEROBIC Blood Culture results may not be optimal due to an excessive volume of blood received in culture bottles   Culture      NO GROWTH < 12 HOURS Performed at [facility] [address], [place], [place] [id number]    Report Status PENDING   CBC with Differential  Result Value Ref Range   WBC 22.7 (H) 4.0 - 10.5 K/uL   RBC 2.91 (L) 3.87 - 5.11 MIL/uL   Hemoglobin 9.6 (L) 12.0 - 15.0 g/dL   HCT 29.4 (L) 36.0 - 46.0 %   MCV 101.0 (H) 80.0 - 100.0 fL   MCH 33.0 26.0 - 34.0 pg   MCHC 32.7 30.0 - 36.0 g/dL   RDW 15.9 (H) 11.5 - 15.5 %   Platelets 273 150 - 400 K/uL   nRBC 0.0 0.0 - 0.2 %   Neutrophils Relative % 89 %   Neutro Abs 19.8 (H) 1.7 - 7.7 K/uL   Lymphocytes Relative 4 %   Lymphs  Abs 1.0 0.7 - 4.0 K/uL   Monocytes Relative 6 %   Monocytes Absolute 1.5 (H) 0.1 - 1.0 K/uL   Eosinophils Relative 0 %   Eosinophils Absolute 0.0 0.0 - 0.5 K/uL   Basophils Relative 0 %   Basophils Absolute 0.1 0.0 - 0.1 K/uL   Immature Granulocytes 1 %   Abs Immature Granulocytes 0.31 (H) 0.00 - 0.07 K/uL  Comprehensive metabolic panel  Result Value Ref Range   Sodium 131 (L) 135 - 145 mmol/L   Potassium 6.1 (H) 3.5 - 5.1 mmol/L   Chloride 96 (L) 98 - 111 mmol/L   CO2 23 22 - 32 mmol/L   Glucose, Bld 168 (H) 70 - 99 mg/dL   BUN 30 (H) 8 - 23 mg/dL   Creatinine, Ser 0.84 0.44 - 1.00 mg/dL   Calcium 9.0 8.9 - 10.3 mg/dL   Total Protein 6.2 (L) 6.5 - 8.1 g/dL   Albumin 2.3 (L) 3.5 - 5.0 g/dL   AST 23 15 - 41 U/L   ALT 20 0 - 44 U/L   Alkaline  Phosphatase 105 38 - 126 U/L   Total Bilirubin 0.8 0.3 - 1.2 mg/dL   GFR, Estimated >60 >60 mL/min   Anion gap 12 5 - 15  Brain natriuretic peptide  Result Value Ref Range   B Natriuretic Peptide 294.5 (H) 0.0 - 100.0 pg/mL  Lactic acid, plasma  Result Value Ref Range   Lactic Acid, Venous 2.3 (HH) 0.5 - 1.9 mmol/L  Lactic acid, plasma  Result Value Ref Range   Lactic Acid, Venous 1.2 0.5 - 1.9 mmol/L  CBC  Result Value Ref Range   WBC 17.9 (H) 4.0 - 10.5 K/uL   RBC 2.93 (L) 3.87 - 5.11 MIL/uL   Hemoglobin 9.3 (L) 12.0 - 15.0 g/dL   HCT 29.7 (L) 36.0 - 46.0 %   MCV 101.4 (H) 80.0 - 100.0 fL   MCH 31.7 26.0 - 34.0 pg   MCHC 31.3 30.0 - 36.0 g/dL   RDW 15.8 (H) 11.5 - 15.5 %   Platelets 257 150 - 400 K/uL   nRBC 0.0 0.0 - 0.2 %  Basic metabolic panel  Result Value Ref Range   Sodium 134 (L) 135 - 145 mmol/L   Potassium 4.6 3.5 - 5.1 mmol/L   Chloride 99 98 - 111 mmol/L   CO2 22 22 - 32 mmol/L   Glucose, Bld 175 (H) 70 - 99 mg/dL   BUN 23 8 - 23 mg/dL   Creatinine, Ser 0.59 0.44 - 1.00 mg/dL   Calcium 8.6 (L) 8.9 - 10.3 mg/dL   GFR, Estimated >60 >60 mL/min   Anion gap 13 5 - 15  Procalcitonin - Baseline  Result Value Ref  Range   Procalcitonin 8.51 ng/mL  I-Stat venous blood gas, ED  Result Value Ref Range   pH, Ven 7.356 7.250 - 7.430   pCO2, Ven 49.3 44.0 - 60.0 mmHg   pO2, Ven 111.0 (H) 32.0 - 45.0 mmHg   Bicarbonate 27.6 20.0 - 28.0 mmol/L   TCO2 29 22 - 32 mmol/L   O2 Saturation 98.0 %   Acid-Base Excess 2.0 0.0 - 2.0 mmol/L   Sodium 130 (L) 135 - 145 mmol/L   Potassium 5.6 (H) 3.5 - 5.1 mmol/L   Calcium, Ion 1.13 (L) 1.15 - 1.40 mmol/L   HCT 31.0 (L) 36.0 - 46.0 %   Hemoglobin 10.5 (L) 12.0 - 15.0 g/dL   Sample type VENOUS   Troponin I (High Sensitivity)  Result Value Ref Range   Troponin I (High Sensitivity) 26 (H) <18 ng/L  Troponin I (High Sensitivity)  Result Value Ref Range   Troponin I (High Sensitivity) 30 (H) <18 ng/L   C. Wynetta Emery, MD [facility]   08/18/2020 10:18 PM How to contact the [facility] Attending or Consulting provider [place] or covering provider during after hours [place], for this patient?  1. Check the care team in [facility] and look for a) attending/consulting [facility] provider listed and b) the [facility] team listed 2. Log into [email or web address] and use [place]'s universal password to access. If you do not have the password, please contact the hospital operator. 3. Locate the [facility] provider you are looking for under [facility] and page to a number that you can be directly reached. 4. If you still have difficulty reaching the provider, please page the [facility] (Director on Call) for the Hospitalists listed on amion for assistance.

## 2020-08-19 NOTE — Progress Notes (Signed)
RT assessed patient during breathing treatment. Patient requesting inhalers she had in the ED. Patient in obvious distress but does not wish to wear BiPAP. Spoke with RN and she is waiting on med orders. Told patient BiPAP was available if she changes her mind. RT will continue to monitor.

## 2020-08-20 ENCOUNTER — Inpatient Hospital Stay (HOSPITAL_COMMUNITY): Payer: Medicare Other

## 2020-08-20 DIAGNOSIS — Z66 Do not resuscitate: Secondary | ICD-10-CM

## 2020-08-20 DIAGNOSIS — I4891 Unspecified atrial fibrillation: Secondary | ICD-10-CM | POA: Diagnosis not present

## 2020-08-20 DIAGNOSIS — R531 Weakness: Secondary | ICD-10-CM | POA: Diagnosis not present

## 2020-08-20 DIAGNOSIS — J189 Pneumonia, unspecified organism: Secondary | ICD-10-CM | POA: Diagnosis not present

## 2020-08-20 DIAGNOSIS — R053 Chronic cough: Secondary | ICD-10-CM | POA: Diagnosis not present

## 2020-08-20 DIAGNOSIS — J441 Chronic obstructive pulmonary disease with (acute) exacerbation: Secondary | ICD-10-CM

## 2020-08-20 DIAGNOSIS — J9621 Acute and chronic respiratory failure with hypoxia: Secondary | ICD-10-CM | POA: Diagnosis not present

## 2020-08-20 LAB — BASIC METABOLIC PANEL
Anion gap: 13 (ref 5–15)
BUN: 30 mg/dL — ABNORMAL HIGH (ref 8–23)
CO2: 22 mmol/L (ref 22–32)
Calcium: 8.7 mg/dL — ABNORMAL LOW (ref 8.9–10.3)
Chloride: 100 mmol/L (ref 98–111)
Creatinine, Ser: 0.77 mg/dL (ref 0.44–1.00)
GFR, Estimated: >60 mL/min (ref >60)
Glucose, Bld: 146 mg/dL — ABNORMAL HIGH (ref 70–99)
Potassium: 3.9 mmol/L (ref 3.5–5.1)
Sodium: 135 mmol/L (ref 135–145)

## 2020-08-20 LAB — MAGNESIUM: Magnesium: 1.9 mg/dL (ref 1.7–2.4)

## 2020-08-20 LAB — CBC
HCT: 24.9 % — ABNORMAL LOW (ref 36.0–46.0)
Hemoglobin: 8.2 g/dL — ABNORMAL LOW (ref 12.0–15.0)
MCH: 32.2 pg (ref 26.0–34.0)
MCHC: 32.9 g/dL (ref 30.0–36.0)
MCV: 97.6 fL (ref 80.0–100.0)
Platelets: 298 10*3/uL (ref 150–400)
RBC: 2.55 MIL/uL — ABNORMAL LOW (ref 3.87–5.11)
RDW: 16.2 % — ABNORMAL HIGH (ref 11.5–15.5)
WBC: 14.7 10*3/uL — ABNORMAL HIGH (ref 4.0–10.5)
nRBC: 0.1 % (ref 0.0–0.2)

## 2020-08-20 LAB — LEGIONELLA PNEUMOPHILA SEROGP 1 UR AG: L. pneumophila Serogp 1 Ur Ag: NEGATIVE

## 2020-08-20 LAB — PROCALCITONIN: Procalcitonin: 11.57 ng/mL

## 2020-08-20 MED ORDER — GUAIFENESIN ER 600 MG PO TB12
1200.0000 mg | ORAL_TABLET | Freq: Two times a day (BID) | ORAL | Status: DC
  Administered 2020-08-20 – 2020-08-23 (×7): 1200 mg via ORAL
  Filled 2020-08-20 (×8): qty 2

## 2020-08-20 MED ORDER — DOXYCYCLINE HYCLATE 100 MG PO TABS
100.0000 mg | ORAL_TABLET | Freq: Two times a day (BID) | ORAL | Status: DC
  Administered 2020-08-20 – 2020-08-22 (×4): 100 mg via ORAL
  Filled 2020-08-20 (×4): qty 1

## 2020-08-20 MED ORDER — HYDROCERIN EX CREA
TOPICAL_CREAM | Freq: Every day | CUTANEOUS | Status: DC
  Filled 2020-08-20: qty 113

## 2020-08-20 NOTE — Progress Notes (Addendum)
PROGRESS NOTE   [name]  ACZ:[id number] DOB: 19-Jan-1946 DOA: 08/18/2020 PCP: Christain Sacramento, MD   Chief Complaint  Patient presents with  . Shortness of Breath   Level of care: Telemetry Medical  Brief Admission History:  75 y.o. female with medical history significant for alcohol abuse, COPD, chronic 4-5 L/min supplemental oxygen requirement, pulmonary hypertension, atrial fibrillation not anticoagulated due to bleeding risk, and admission for COVID-19 infection and COPD exacerbation last month, now presenting to the emergency department for evaluation of productive cough, increased supplemental oxygen requirement, and fever.  Patient was positive for COVID-19 on 07/27/2020, was discharged to SNF recently, has had increased cough productive of thick green sputum, was reported to be febrile at the nursing facility, and was saturating 70% on 5 L/min of supplemental oxygen.  She was placed on nonrebreather and transported to the ED.  She denies any associated chest pain, denies any leg swelling or tenderness, and does not feel that she has been wheezing.  ED Course: Upon arrival to the ED, patient is found to be afebrile, saturating low to mid 90s on 6 L/min of supplemental oxygen, mildly tachypneic, tachycardic in the 120s, and with blood pressure 118/63.  EKG features sinus tachycardia 322 and PVC.  Chest x-ray is concerning for severe emphysema and left-sided pneumonia.  Chemistry panel notable for sodium 131, potassium 6.1, and elevated BUN to creatinine ratio.  CBC features a leukocytosis to 22,700 and a macrocytic anemia with hemoglobin 9.6.  Initial lactic acid is 2.3.  High-sensitivity troponin was 26 and then 30.  BNP is similar to priors at 295.  Blood cultures were collected and the patient was treated with 125 mg IV Solu-Medrol, 1 L of LR, cefepime, vancomycin, IV magnesium, and DuoNeb.    Assessment & Plan:   Principal Problem:   Sepsis due to pneumonia [facility]) Active  Problems:   COPD GOLD III   Hyperkalemia   Atrial fibrillation with RVR (HCC)   DNR (do not resuscitate)   Chronic cough   Essential hypertension   Chronic respiratory failure (HCC)   Cor pulmonale, chronic (HCC)   COPD with acute exacerbation (HCC)   Hyponatremia   Acute on chronic respiratory failure with hypoxia (HCC)   Elevated troponin   Generalized weakness   Macrocytic anemia   Pressure injury of skin   Protein-calorie malnutrition, severe   DNR (do not resuscitate) discussion   Wears partial dentures   On home oxygen therapy   1. Sepsis due to pneumonia - resolved now after aggressive treatments.  2. Acute respiratory failure with hypoxia and hypercarbia - secondary to COPD exacerbation - continue IV steroids, nebs, supplemental oxygen, add mucinex 1200 mg BID.  3. Multifocal pneumonia - continue IV doxycycline as she is clinically improving.  4. Atrial fibrillation with RVR - HR better controlled, continue home dose diltiazem CR 360  5. Pulmonary hypertension - chronic; resume home lasix.   6. Hyperkalemia - treated and resolved, holding spironolactone for now.  7. DNR present on admission - Pt is enrolled in outpatient palliative care.  She is at end stages of her battle with lung disease.  Appreciate palliative consult for symptom management.    DVT prophylaxis: lovenox  Code Status: DNR  Family Communication:  Disposition:  Status is: Inpatient  Remains inpatient appropriate because:IV treatments appropriate due to intensity of illness or inability to take PO and Inpatient level of care appropriate due to severity of illness   Dispo: The patient is from:  Home              Anticipated d/c is to: Home              Patient currently is not medically stable to d/c.  Difficult to place patient No   Consultants:   Palliative care  Procedures:     Antimicrobials:  Doxycycline IV   Subjective: Pt reports slight improvement in respirations, thick cough  with congestion (nonproductive)  Objective: Vitals:   08/20/20 0322 08/20/20 0417 08/20/20 0739 08/20/20 1137  BP:  110/61    Pulse: 78 92    Resp: 18 18    Temp:  (!) 97.3 F (36.3 C)    TempSrc:  Oral    SpO2: 94% 96% (!) 89% 91%  Weight:      Height:        Intake/Output Summary (Last 24 hours) at 08/20/2020 1320 Last data filed at 08/19/2020 2136 Gross per 24 hour  Intake 499.66 ml  Output --  Net 499.66 ml   Filed Weights   08/19/20 1714  Weight: 46.4 kg    Examination:  General exam: thin, frail, elderly, chronically ill appearing, cachectic and emaciated. Awake and alert, in obvious respiratory discomfort.  Respiratory system: poor air movement, exp wheezes, chronic cough, chest congestion. Cardiovascular system: normal S1 & S2 heard. No JVD, murmurs, rubs, gallops or clicks. No pedal edema. Gastrointestinal system: Abdomen is nondistended, soft and nontender. No organomegaly or masses felt. Normal bowel sounds heard. Central nervous system: Alert and oriented. No focal neurological deficits. Extremities: Symmetric 5 x 5 power. Skin: No rashes, lesions or ulcers Psychiatry: Judgement and insight appear normal. Mood & affect appropriate.   Data Reviewed: I have personally reviewed following labs and imaging studies  CBC: Recent Labs  Lab 08/18/20 2249 08/18/20 2327 08/19/20 0825 08/20/20 0201  WBC 22.7*  --  17.9* 14.7*  NEUTROABS 19.8*  --   --   --   HGB 9.6* 10.5* 9.3* 8.2*  HCT 29.4* 31.0* 29.7* 24.9*  MCV 101.0*  --  101.4* 97.6  PLT 273  --  257 263    Basic Metabolic Panel: Recent Labs  Lab 08/18/20 2249 08/18/20 2327 08/19/20 0825 08/20/20 0201  NA 131* 130* 134* 135  K 6.1* 5.6* 4.6 3.9  CL 96*  --  99 100  CO2 23  --  22 22  GLUCOSE 168*  --  175* 146*  BUN 30*  --  23 30*  CREATININE 0.84  --  0.59 0.77  CALCIUM 9.0  --  8.6* 8.7*  MG  --   --   --  1.9    GFR: Estimated Creatinine Clearance: 45.2 mL/min (by C-G formula based on  SCr of 0.77 mg/dL).  Liver Function Tests: Recent Labs  Lab 08/18/20 2249  AST 23  ALT 20  ALKPHOS 105  BILITOT 0.8  PROT 6.2*  ALBUMIN 2.3*    CBG: No results for input(s): GLUCAP in the last 168 hours.  Recent Results (from the past 240 hour(s))  Culture, blood (routine x 2)     Status: None (Preliminary result)   Collection Time: 08/18/20 11:16 PM   Specimen: BLOOD RIGHT FOREARM  Result Value Ref Range Status   Specimen Description BLOOD RIGHT FOREARM  Final   Special Requests   Final    BOTTLES DRAWN AEROBIC AND ANAEROBIC Blood Culture adequate volume   Culture   Final    NO GROWTH 1 DAY Performed at [facility]  [facility], [place] [address] [place], [place] [address]    Report Status PENDING  Incomplete  Culture, blood (routine x 2)     Status: None (Preliminary result)   Collection Time: 08/19/20  1:13 AM   Specimen: BLOOD LEFT HAND  Result Value Ref Range Status   Specimen Description BLOOD LEFT HAND  Final   Special Requests   Final    BOTTLES DRAWN AEROBIC AND ANAEROBIC Blood Culture results may not be optimal due to an excessive volume of blood received in culture bottles   Culture   Final    NO GROWTH 1 DAY Performed at [facility], [place] [address] [place], [place] [address]    Report Status PENDING  Incomplete     Radiology Studies: DG CHEST PORT 1 VIEW  Result Date: 08/20/2020 CLINICAL DATA:  Shortness of breath and cough EXAM: PORTABLE CHEST 1 VIEW COMPARISON:  August 18, 2020 FINDINGS: There is persistent airspace opacity in much of the left mid and lower lung regions. Compared to most recent study, there is slightly more airspace opacity in the left lower lobe. There is subtle airspace opacity in the right base, slightly increased compared to recent study. Underlying emphysematous changes are present. The heart size is normal. Pulmonary vascularity reflects underlying emphysema is stable. No adenopathy. There is aortic atherosclerosis. No bone lesions.  IMPRESSION: Airspace opacity on the left, slightly increased. New subtle airspace opacity right base. Suspect multifocal pneumonia. Underlying emphysema. Stable cardiac silhouette. There is aortic atherosclerosis. Aortic Atherosclerosis (ICD10-I70.0) and Emphysema (ICD10-J43.9). Electronically Signed   By: Lowella Grip III M.D.   On: 08/20/2020 07:58   DG Chest Port 1 View  Result Date: 08/18/2020 CLINICAL DATA:  Shortness of breath. EXAM: PORTABLE CHEST 1 VIEW COMPARISON:  Radiograph 08/01/2020.  CT 01/16/2020 FINDINGS: Extensive patchy airspace disease in the left mid lower lung zone. Streaky opacities in the right infrahilar lung. Slightly spiculated appearance in the right perihilar region. Advanced underlying emphysema. Normal heart size. No pneumothorax or large pleural effusion. The bones are subjectively under mineralized. IMPRESSION: Extensive patchy airspace disease in the left mid and lower lung zone, suspicious for pneumonia. Streaky right infrahilar opacities may be atelectasis or infection. Recommend radiographic follow-up to resolution. Advanced emphysema. Electronically Signed   By: Keith Rake M.D.   On: 08/18/2020 23:10    Scheduled Meds: . arformoterol  15 mcg Nebulization BID  . aspirin  81 mg Oral Daily  . budesonide  0.5 mg Nebulization BID  . diltiazem  360 mg Oral Daily  . enoxaparin (LOVENOX) injection  30 mg Subcutaneous Q24H  . furosemide  40 mg Oral Daily  . guaiFENesin  1,200 mg Oral BID  . hydrocerin   Topical Daily  . ipratropium-albuterol  3 mL Nebulization Q4H  . isosorbide mononitrate  30 mg Oral Daily  . methylPREDNISolone (SOLU-MEDROL) injection  80 mg Intravenous Q6H  . pantoprazole  40 mg Oral Q1200   Continuous Infusions: . doxycycline (VIBRAMYCIN) IV 100 mg (08/20/20 0841)     LOS: 1 day   Time spent: 102 mins   Daril Warga Wynetta Emery, MD How to contact the [facility] Attending or Consulting provider [place] or covering provider during after hours [place], for this patient?  1. Check the care team in [facility] and look for a) attending/consulting [facility] provider listed and b) the [facility] team listed 2. Log into [email or web address] and use [place]'s universal password to access. If you do not have the  password, please contact the hospital operator. 3. Locate the [facility] provider you are looking for under [facility] and page to a number that you can be directly reached. 4. If you still have difficulty reaching the provider, please page the [facility] (Director on Call) for the Hospitalists listed on amion for assistance.  08/20/2020, 1:20 PM

## 2020-08-20 NOTE — Consult Note (Signed)
[facility] Nurse Consult Note: Reason for Consult: Consult requested for bilat legs and feet.   Pt has darker-colored generalized loose scaly peeling skin which easily removes in large plaque areas; appearance is consistent with venous stasis changes Left heel with stage 2 pressure injury; 3X3X.1cm, pink and moist Pressure Injury POA: Yes Dressing procedure/placement/frequency: Topical treatment orders provided for bedside nurses to perform to promote healing as follows: Apply Eucerin cream to bilat legs Q day after bathing and roughly towel-drying to assist with removal of loose skin.  Float heels to reduce pressure.  Foam dressing to protect heels, change Q 3 days or PRN soiling. Please re-consult if further assistance is needed.  Thank-you,  Julien Girt MSN, [place], [place], [place], [place]

## 2020-08-20 NOTE — Progress Notes (Signed)
Daily Progress Note   Patient Name: [name]       Date: 08/20/2020 DOB: [birth year]  Age: 75 y.o. MRN#: [id number] Attending Physician: Murlean Iba, MD Primary Care Physician: Christain Sacramento, MD Admit Date: 08/18/2020  Reason for Follow-up: continued [place] discussions, symptom management  Subjective: 17:35--I spoke with Sonja by phone to check on progress with obtaining support for patient to return home with 24/7 supervision. She states she is waiting to receive a list of agencies. I let her know I would reach out to [facility] in the morning and request a list. I asked if she was planning to visit tomorrow, thinking I could leave the list in the room for her. She states she was not planning to visit, so I offer to scan and email this to her. Sonja verbalizes that their friend Burt Knack) currently lives with patient and is available to help from 8 pm to 10 am.   18:30--At bedside, patient reports chest pain (this is chronic for her) but has no other acute complaints. Discussed the potential disposition plan of home with hospice. She seems to open to this, but wants to be able to "stand to use the potty chair first". I gently stated this may not be a realistic goal at this stage in her illness. Discussed that Becky Sax was working on obtaining care givers to be present 24/7. I assist patient with calling Sonja from the room phone.   Length of Stay: 1  Current Medications: Scheduled Meds:  . arformoterol  15 mcg Nebulization BID  . aspirin  81 mg Oral Daily  . budesonide  0.5 mg Nebulization BID  . diltiazem  360 mg Oral Daily  . doxycycline  100 mg Oral Q12H  . enoxaparin (LOVENOX) injection  30 mg Subcutaneous Q24H  . furosemide  40 mg Oral Daily  . guaiFENesin  1,200 mg Oral BID  .  hydrocerin   Topical Daily  . ipratropium-albuterol  3 mL Nebulization Q4H  . isosorbide mononitrate  30 mg Oral Daily  . methylPREDNISolone (SOLU-MEDROL) injection  80 mg Intravenous Q6H  . pantoprazole  40 mg Oral Q1200     PRN Meds: albuterol, guaiFENesin-dextromethorphan, HYDROcodone-acetaminophen, LORazepam, morphine injection  Physical Exam Vitals reviewed.  Constitutional:      General: She is not in acute distress.  Appearance: She is ill-appearing.  Cardiovascular:     Rate and Rhythm: Normal rate and regular rhythm.  Neurological:     Mental Status: She is alert.     Motor: Weakness present.             Vital Signs: BP 110/61 (BP Location: Left Arm)   Pulse 92   Temp (!) 97.3 F (36.3 C) (Oral)   Resp 18   Ht _0  (1.575 m)   Wt 46.4 kg   SpO2 (!) 89%   BMI 18.69 kg/m  SpO2: SpO2: (!) 89 % O2 Device: O2 Device: Nasal Cannula O2 Flow Rate: O2 Flow Rate (L/min): 5 L/min  Intake/output summary:   Intake/Output Summary (Last 24 hours) at 08/20/2020 1735 Last data filed at 08/20/2020 1600 Gross per 24 hour  Intake 990 ml  Output -  Net 990 ml   LBM: Last BM Date: 08/19/20 Baseline Weight: Weight: 46.4 kg Most recent weight: Weight: 46.4 kg       Palliative Assessment/Data: PPS 30%       Palliative Care Assessment & Plan   HPI/Patient Profile: 75 y.o. female  with past medical history of end stage COPD, atrial fibrillation, failure to thrive, pulmonary hypertension, CAD, and severe protein calorie malnutrition presented to the ED on 08/18/20 from [facility] rehab facility with complaints of shortness of breath. She was admitted on 08/18/2020 with sepsis secondary to pneumonia, acute on chronic hypoxic respiratory failure, atrial fibrillation with RVR.   PMT worked with patient during her previous admission from 2/11-2/22/22. She was hospice appropriate at that time; however, patient needs 24/7 care and does not have family/friends able to provide the care  she needed at home for safe discharge with hospice. Therefore, she discharged to rehab.  ED Course:Upon arrival to the ED, patient is found to be afebrile, saturating low to mid 90s on 6 L/min of supplemental oxygen, mildly tachypneic, tachycardic in the 120s, and with blood pressure 118/63. EKG features sinus tachycardia 322 and PVC. Chest x-ray is concerning for severe emphysema and left-sided pneumonia.   Recommendations/Plan:  Continue current full scope medical treatment  DNR/DNI as previously documented  Considering home hospice if 24/7 supervision can be arranged by friend/Sonja  spiritual care consult for assistance with HCPOA document  Patient does not want daughter involved in her care  Continue morphine 2 mg IV every 4 hours PRN pain/dyspnea  PMT will continue to follow  Goals of Care and Additional Recommendations:  Limitations on Scope of Treatment: full scope  Code Status:  DNR/DNI  Prognosis:   less than 6 months  Discharge Planning:  To Be Determined  Care plan was discussed with nursing  Thank you for allowing the Palliative Medicine Team to assist in the care of this patient.   Total Time 25 minutes Prolonged Time Billed  no      Greater than 50%  of this time was spent counseling and coordinating care related to the above assessment and plan.  Lavena Bullion, NP  Please contact Palliative Medicine Team phone at [phone] for questions and concerns.

## 2020-08-20 NOTE — Plan of Care (Signed)

## 2020-08-20 NOTE — Progress Notes (Signed)
   08/20/20 1230  Clinical Encounter Type  Visited With Patient  Visit Type Initial  Referral From Nurse  Consult/Referral To Chaplain  Chaplain responded to the consultation. The patient stated she would like her friend, Elliot Dally., of fifty-plus years to be her healthcare power of attorney, not family members. She stated Katharine Look will come by today. I provided education to the patient and advised a chaplain can also provide education when Ms. Katharine Look visits but can only provide a notary if within office hours. The patient stated she understood. This note was prepared by Jeanine Luz, M.Div..  For questions please contact by phone [phone].

## 2020-08-21 DIAGNOSIS — R053 Chronic cough: Secondary | ICD-10-CM | POA: Diagnosis not present

## 2020-08-21 DIAGNOSIS — J449 Chronic obstructive pulmonary disease, unspecified: Secondary | ICD-10-CM | POA: Diagnosis not present

## 2020-08-21 DIAGNOSIS — J9621 Acute and chronic respiratory failure with hypoxia: Secondary | ICD-10-CM | POA: Diagnosis not present

## 2020-08-21 DIAGNOSIS — Z515 Encounter for palliative care: Secondary | ICD-10-CM | POA: Diagnosis not present

## 2020-08-21 DIAGNOSIS — I4891 Unspecified atrial fibrillation: Secondary | ICD-10-CM | POA: Diagnosis not present

## 2020-08-21 DIAGNOSIS — J189 Pneumonia, unspecified organism: Secondary | ICD-10-CM | POA: Diagnosis not present

## 2020-08-21 LAB — BASIC METABOLIC PANEL
Anion gap: 11 (ref 5–15)
BUN: 31 mg/dL — ABNORMAL HIGH (ref 8–23)
CO2: 26 mmol/L (ref 22–32)
Calcium: 8.5 mg/dL — ABNORMAL LOW (ref 8.9–10.3)
Chloride: 97 mmol/L — ABNORMAL LOW (ref 98–111)
Creatinine, Ser: 0.75 mg/dL (ref 0.44–1.00)
GFR, Estimated: >60 mL/min (ref >60)
Glucose, Bld: 164 mg/dL — ABNORMAL HIGH (ref 70–99)
Potassium: 3.7 mmol/L (ref 3.5–5.1)
Sodium: 134 mmol/L — ABNORMAL LOW (ref 135–145)

## 2020-08-21 LAB — CBC
HCT: 23.2 % — ABNORMAL LOW (ref 36.0–46.0)
Hemoglobin: 7.6 g/dL — ABNORMAL LOW (ref 12.0–15.0)
MCH: 32.1 pg (ref 26.0–34.0)
MCHC: 32.8 g/dL (ref 30.0–36.0)
MCV: 97.9 fL (ref 80.0–100.0)
Platelets: 369 10*3/uL (ref 150–400)
RBC: 2.37 MIL/uL — ABNORMAL LOW (ref 3.87–5.11)
RDW: 16.4 % — ABNORMAL HIGH (ref 11.5–15.5)
WBC: 12.9 10*3/uL — ABNORMAL HIGH (ref 4.0–10.5)
nRBC: 0 % (ref 0.0–0.2)

## 2020-08-21 LAB — EXPECTORATED SPUTUM ASSESSMENT W GRAM STAIN, RFLX TO RESP C

## 2020-08-21 LAB — MAGNESIUM: Magnesium: 1.6 mg/dL — ABNORMAL LOW (ref 1.7–2.4)

## 2020-08-21 MED ORDER — MAGNESIUM SULFATE 4 GM/100ML IV SOLN
4.0000 g | Freq: Once | INTRAVENOUS | Status: AC
  Administered 2020-08-21: 4 g via INTRAVENOUS
  Filled 2020-08-21: qty 100

## 2020-08-21 MED ORDER — SACCHAROMYCES BOULARDII 250 MG PO CAPS
250.0000 mg | ORAL_CAPSULE | Freq: Two times a day (BID) | ORAL | Status: DC
  Administered 2020-08-21 – 2020-08-22 (×3): 250 mg via ORAL
  Filled 2020-08-21 (×3): qty 1

## 2020-08-21 MED ORDER — SODIUM CHLORIDE 0.9 % IV SOLN
2.0000 g | Freq: Two times a day (BID) | INTRAVENOUS | Status: DC
  Administered 2020-08-21 – 2020-08-22 (×3): 2 g via INTRAVENOUS
  Filled 2020-08-21 (×3): qty 2

## 2020-08-21 MED ORDER — SODIUM CHLORIDE 0.9 % IV SOLN: 1.0000 g | Freq: Three times a day (TID) | INTRAVENOUS | Status: DC

## 2020-08-21 MED ORDER — VANCOMYCIN HCL 1000 MG/200ML IV SOLN
1000.0000 mg | INTRAVENOUS | Status: DC
  Administered 2020-08-21: 1000 mg via INTRAVENOUS
  Filled 2020-08-21: qty 200

## 2020-08-21 NOTE — Progress Notes (Signed)
Daily Progress Note   Patient Name: [name]       Date: 08/21/2020 DOB: 1945-10-20  Age: 75 y.o. MRN#: [id number] Attending Physician: Murlean Iba, MD Primary Care Physician: Christain Sacramento, MD Admit Date: 08/18/2020  Reason for Follow-up:  Subjective: Patient received morphine earlier for chest pain and dyspnea, reports that it helped "a little". Discussed potential options again - home with hospice versus rehab. Discussed that Becky Sax was working on obtaining 24/7 support, but so far nothing was in place. I ask if it is important to her to be home, she replies "not really". Patient wants to know if she can just remain in the hospital. She again expresses she wants to be able to transfer from bed to bedside commode. I again gently stated this was not likely a realistic goal considering her end-stage disease. Discussed that she continued to be severely dyspneic. Patient states "I know I'm dying". I acknowledge that is true and ask if she is at peace with dying - she replies "for the most part".  Considering her end-stage disease and need for symptom management, discussed the possibility of residential hospice. Patient states she would be open to this.   16:24--Spoke with Sonja by phone. Let her know I had scanned and emailed her the list of home health agencies as requested. Also encouraged her to go to [email or web address]  I told Becky Sax about my conversation with patient earlier, when she stated she was dying. I education that patient's time is limited, and we should start focusing on comfort. Sonja verbalizes understanding and states she will call patient tonight to discuss.   Length of Stay: 2  Current Medications: Scheduled Meds:  . arformoterol  15 mcg Nebulization BID  . aspirin  81 mg Oral  Daily  . budesonide  0.5 mg Nebulization BID  . diltiazem  360 mg Oral Daily  . doxycycline  100 mg Oral Q12H  . enoxaparin (LOVENOX) injection  30 mg Subcutaneous Q24H  . furosemide  40 mg Oral Daily  . guaiFENesin  1,200 mg Oral BID  . hydrocerin   Topical Daily  . ipratropium-albuterol  3 mL Nebulization Q4H  . isosorbide mononitrate  30 mg Oral Daily  . methylPREDNISolone (SOLU-MEDROL) injection  80 mg Intravenous Q6H  . pantoprazole  40 mg Oral Q1200  .  saccharomyces boulardii  250 mg Oral BID    Continuous Infusions: . ceFEPime (MAXIPIME) IV 2 g (08/21/20 1304)  . vancomycin 1,000 mg (08/21/20 1428)    PRN Meds: albuterol, guaiFENesin-dextromethorphan, HYDROcodone-acetaminophen, LORazepam, morphine injection  Physical Exam Vitals reviewed.  Constitutional:      General: She is not in acute distress.    Appearance: She is ill-appearing.  Cardiovascular:     Rate and Rhythm: Normal rate and regular rhythm.  Pulmonary:     Effort: Accessory muscle usage present.     Comments: Pursed lip breathing Neurological:     Mental Status: She is alert and oriented to person, place, and time.     Motor: Weakness present.             Vital Signs: BP (!) 124/59 (BP Location: Left Arm)   Pulse 91   Temp 98.1 F (36.7 C) (Oral)   Resp 19   Ht _0  (1.575 m)   Wt 46.4 kg   SpO2 (!) 89%   BMI 18.69 kg/m  SpO2: SpO2: (!) 89 % O2 Device: O2 Device: Nasal Cannula O2 Flow Rate: O2 Flow Rate (L/min): 6 L/min  Intake/output summary:   Intake/Output Summary (Last 24 hours) at 08/21/2020 1610 Last data filed at 08/20/2020 2242 Gross per 24 hour  Intake --  Output 900 ml  Net -900 ml   LBM: Last BM Date: 08/19/20 Baseline Weight: Weight: 46.4 kg Most recent weight: Weight: 46.4 kg       Palliative Assessment/Data: PPS 30%       Palliative Care Assessment & Plan   HPI/Patient Profile:74 y.o.femalewith past medical history of end stage COPD, atrial fibrillation,  failure to thrive, pulmonary hypertension, CAD, and severe protein calorie malnutrition presented to the ED on 08/18/20 from [facility] rehab facility with complaints of shortness of breath. She wasadmitted on3/5/2022with sepsis secondary to pneumonia, acute on chronic hypoxic respiratory failure, atrial fibrillation with RVR.  PMT worked with patient during her previous admission from 2/11-2/22/22. She was hospice appropriate at that time; however, patient needs 24/7 care and does not have family/friends able [facility] Therefore, she discharged to rehab.  ED Course:Upon arrival to the ED, patient is found to be afebrile, saturating low to mid 90s on 6 L/min of supplemental oxygen, mildly tachypneic, tachycardic in the 120s, and with blood pressure 118/63. EKG features sinus tachycardia 322 and PVC. Chest x-ray is concerning for severe emphysema and left-sided pneumonia.   Recommendations/Plan:  Continue current full scope medical treatment  DNR/DNI as previously documented  Considering home hospice if 24/7 supervision can be arranged by friend/Sonja  Patient does not want daughter involved in her care  Continue morphine 2 mg IV every 4 hours PRN pain/dyspnea  PMT will continue to follow   Goals of Care and Additional Recommendations:  Limitations on Scope of Treatment: Full Scope Treatment  Code Status: DNR/DNI  Prognosis:   months  Discharge Planning:  To Be Determined  Care plan was discussed with LCSW  Thank you for allowing the Palliative Medicine Team to assist in the care of this patient.   Total Time 35 minutes Prolonged Time Billed  no       Greater than 50%  of this time was spent counseling and coordinating care related to the above assessment and plan.  Lavena Bullion, NP  Please contact Palliative Medicine Team phone at [phone] for questions and concerns.

## 2020-08-21 NOTE — Progress Notes (Signed)
Pharmacy Antibiotic Note  [name] is a 75 y.o. female admitted on 08/18/2020 with shortness of breath and concern for PNA vs COPD exacerbation.  Pharmacy has been consulted for Vancomycin dosing.  Pt also on Cefepime + Doxycycline per MD.  Plan: Vancomycin 1gm IV q36h Dose calculated using SCr 0.8, Vd 0.72, predicted AUC 476 (goal 450-550)   Height: _0  (157.5 cm) Weight: 46.4 kg (102 lb 3.2 oz) IBW/kg (Calculated) : 50.1  Temp (24hrs), Avg:97.9 F (36.6 C), Min:97.8 F (36.6 C), Max:97.9 F (36.6 C)  Recent Labs  Lab 08/18/20 2249 08/18/20 2302 08/19/20 0102 08/19/20 0825 08/20/20 0201 08/21/20 0043  WBC 22.7*  --   --  17.9* 14.7* 12.9*  CREATININE 0.84  --   --  0.59 0.77 0.75  LATICACIDVEN  --  2.3* 1.2  --   --   --     Estimated Creatinine Clearance: 45.2 mL/min (by C-G formula based on SCr of 0.75 mg/dL).    Allergies  Allergen Reactions  . Tramadol Other (See Comments)    GI UPSET    Antimicrobials this admission: 3/8 Cefepime >> 3/8 Vanc >> 3/6 Doxy >>  3/6 Cefepime x 1 3/6 vancomycin x 1 3/6 Azithro x 1  Dose adjustments this admission:   Microbiology results: 3/6 BCx: ngtd   Thank you for allowing pharmacy to be a part of this patient's care.  [employer], Pharm.D., BCPS Clinical Pharmacist Clinical phone for 08/21/2020 from 8:30-4:00 is [phone].  **Pharmacist phone directory can be found on [email or web address] listed under [place].  08/21/2020 11:46 AM

## 2020-08-21 NOTE — Progress Notes (Addendum)
PROGRESS NOTE   [name]  NAT:[id number] DOB: February 01, 1946 DOA: 08/18/2020 PCP: Christain Sacramento, MD   Chief Complaint  Patient presents with   Shortness of Breath   Level of care: Telemetry Medical  Brief Admission History:  75 y.o. female with medical history significant for alcohol abuse, COPD, chronic 4-5 L/min supplemental oxygen requirement, pulmonary hypertension, atrial fibrillation not anticoagulated due to bleeding risk, and admission for COVID-19 infection and COPD exacerbation last month, now presenting to the emergency department for evaluation of productive cough, increased supplemental oxygen requirement, and fever.  Patient was positive for COVID-19 on 07/27/2020, was discharged to SNF recently, has had increased cough productive of thick green sputum, was reported to be febrile at the nursing facility, and was saturating 70% on 5 L/min of supplemental oxygen.  She was placed on nonrebreather and transported to the ED.  She denies any associated chest pain, denies any leg swelling or tenderness, and does not feel that she has been wheezing.   ED Course: Upon arrival to the ED, patient is found to be afebrile, saturating low to mid 90s on 6 L/min of supplemental oxygen, mildly tachypneic, tachycardic in the 120s, and with blood pressure 118/63.  EKG features sinus tachycardia 322 and PVC.  Chest x-ray is concerning for severe emphysema and left-sided pneumonia.  Chemistry panel notable for sodium 131, potassium 6.1, and elevated BUN to creatinine ratio.  CBC features a leukocytosis to 22,700 and a macrocytic anemia with hemoglobin 9.6.  Initial lactic acid is 2.3.  High-sensitivity troponin was 26 and then 30.  BNP is similar to priors at 295.  Blood cultures were collected and the patient was treated with 125 mg IV Solu-Medrol, 1 L of LR, cefepime, vancomycin, IV magnesium, and DuoNeb.     Assessment & Plan:   Principal Problem:   Sepsis due to pneumonia [facility]) Active  Problems:   COPD GOLD III   Hyperkalemia   Atrial fibrillation with RVR (HCC)   DNR (do not resuscitate)   Chronic cough   Essential hypertension   Chronic respiratory failure (HCC)   Cor pulmonale, chronic (HCC)   COPD with acute exacerbation (HCC)   Hyponatremia   Acute on chronic respiratory failure with hypoxia (HCC)   Elevated troponin   Generalized weakness   Macrocytic anemia   Pressure injury of skin   Protein-calorie malnutrition, severe   DNR (do not resuscitate) discussion   Wears partial dentures   On home oxygen therapy   Sepsis due to pneumonia - sepsis resolved now continue supportive measures.  Acute respiratory failure with hypoxia and hypercarbia - secondary to COPD exacerbation - continue IV steroids, nebs, supplemental oxygen, add mucinex 1200 mg BID.  Pt's prognosis remains very poor with high mortality risk.  Morphine ordered for dyspnea symptoms.   Multifocal pneumonia - continue doxycycline, add cefepime and vancomycin.   Atrial fibrillation with RVR -resolved now.  HR better controlled, continue home dose diltiazem CR 360 mg.  Pulmonary hypertension - chronic; resume lasix.   Leukocytosis - WBC trending down.  Follow.  Hypomagnesemia - Mg sulfate 4 gm IV x 1 dose.  Hyperkalemia - treated and resolved, holding spironolactone for now.  DNR present on admission - Pt is enrolled in outpatient palliative care.  She is at end stages of her battle with lung disease.  Appreciate palliative consult for symptom management.  Left heel ulcer stage 2 POA - heel floaters in place for protection.   Pressure Injury 08/19/19 Sacrum Mid  Stage 2 -  Partial thickness loss of dermis presenting as a shallow open injury with a red, pink wound bed without slough. (Active)  08/19/19 0200  Location: Sacrum  Location Orientation: Mid  Staging: Stage 2 -  Partial thickness loss of dermis presenting as a shallow open injury with a red, pink wound bed without slough.  Wound  Description (Comments):   Present on Admission: Yes     Pressure Injury 08/20/20 Heel Left Stage 2 -  Partial thickness loss of dermis presenting as a shallow open injury with a red, pink wound bed without slough. (Active)  08/20/20   Location: Heel  Location Orientation: Left  Staging: Stage 2 -  Partial thickness loss of dermis presenting as a shallow open injury with a red, pink wound bed without slough.  Wound Description (Comments):   Present on Admission: Yes   DVT prophylaxis: lovenox  Code Status: DNR  Family Communication:  Disposition: TBD Status is: Inpatient  Remains inpatient appropriate because:IV treatments appropriate due to intensity of illness or inability to take PO and Inpatient level of care appropriate due to severity of illness  Dispo: The patient is from: Home              Anticipated d/c is to: Home              Patient currently is not medically stable to d/c.  Difficult to place patient No   Consultants:  Palliative care  Procedures:    Antimicrobials:  Doxycycline  Cefepime vancomycin  Subjective: Pt reports increasing shortness of breath since this morning. Coughing up thick purulent secretions.   Objective: Vitals:   08/20/20 2332 08/21/20 0036 08/21/20 0516 08/21/20 0903  BP:  112/62 114/64   Pulse: 92 85 79   Resp: (!) _0 Temp:  97.9 F (36.6 C) 97.8 F (36.6 C)   TempSrc:  Oral Oral   SpO2:  94% 93% (!) 88%  Weight:      Height:        Intake/Output Summary (Last 24 hours) at 08/21/2020 1110 Last data filed at 08/20/2020 2242 Gross per 24 hour  Intake 620 ml  Output 900 ml  Net -280 ml   Filed Weights   08/19/20 1714  Weight: 46.4 kg    Examination:  General exam: thin, frail, elderly, chronically ill appearing, cachectic and emaciated. Awake and alert, in obvious respiratory discomfort.  Respiratory system: poor air movement, exp wheezes, chronic cough, chest congestion. Cardiovascular system: normal S1 & S2  heard. No JVD, murmurs, rubs, gallops or clicks. No pedal edema. Gastrointestinal system: Abdomen is nondistended, soft and nontender. No organomegaly or masses felt. Normal bowel sounds heard. Central nervous system: Alert and oriented. No focal neurological deficits. Extremities: Symmetric 5 x 5 power. Skin: No rashes, lesions or ulcers Psychiatry: Judgement and insight appear normal. Mood & affect appropriate.   Data Reviewed: I have personally reviewed following labs and imaging studies  CBC: Recent Labs  Lab 08/18/20 2249 08/18/20 2327 08/19/20 0825 08/20/20 0201 08/21/20 0043  WBC 22.7*  --  17.9* 14.7* 12.9*  NEUTROABS 19.8*  --   --   --   --   HGB 9.6* 10.5* 9.3* 8.2* 7.6*  HCT 29.4* 31.0* 29.7* 24.9* 23.2*  MCV 101.0*  --  101.4* 97.6 97.9  PLT 273  --  257 298 706    Basic Metabolic Panel: Recent Labs  Lab 08/18/20 2249 08/18/20 2327 08/19/20 0825 08/20/20 0201 08/21/20  0043  NA 131* 130* 134* 135 134*  K 6.1* 5.6* 4.6 3.9 3.7  CL 96*  --  99 100 97*  CO2 23  --  _0 GLUCOSE 168*  --  175* 146* 164*  BUN 30*  --  23 30* 31*  CREATININE 0.84  --  0.59 0.77 0.75  CALCIUM 9.0  --  8.6* 8.7* 8.5*  MG  --   --   --  1.9 1.6*    GFR: Estimated Creatinine Clearance: 45.2 mL/min (by C-G formula based on SCr of 0.75 mg/dL).  Liver Function Tests: Recent Labs  Lab 08/18/20 2249  AST 23  ALT 20  ALKPHOS 105  BILITOT 0.8  PROT 6.2*  ALBUMIN 2.3*    CBG: No results for input(s): GLUCAP in the last 168 hours.  Recent Results (from the past 240 hour(s))  Culture, blood (routine x 2)     Status: None (Preliminary result)   Collection Time: 08/18/20 11:16 PM   Specimen: BLOOD RIGHT FOREARM  Result Value Ref Range Status   Specimen Description BLOOD RIGHT FOREARM  Final   Special Requests   Final    BOTTLES DRAWN AEROBIC AND ANAEROBIC Blood Culture adequate volume   Culture   Final    NO GROWTH 1 DAY Performed at [facility], [address] [place], [place] [address]    Report Status PENDING  Incomplete  Culture, blood (routine x 2)     Status: None (Preliminary result)   Collection Time: 08/19/20  1:13 AM   Specimen: BLOOD LEFT HAND  Result Value Ref Range Status   Specimen Description BLOOD LEFT HAND  Final   Special Requests   Final    BOTTLES DRAWN AEROBIC AND ANAEROBIC Blood Culture results may not be optimal due to an excessive volume of blood received in culture bottles   Culture   Final    NO GROWTH 1 DAY Performed at [facility], [place] [address] [place], [place] [address]    Report Status PENDING  Incomplete     Radiology Studies: DG CHEST PORT 1 VIEW  Result Date: 08/20/2020 CLINICAL DATA:  Shortness of breath and cough EXAM: PORTABLE CHEST 1 VIEW COMPARISON:  August 18, 2020 FINDINGS: There is persistent airspace opacity in much of the left mid and lower lung regions. Compared to most recent study, there is slightly more airspace opacity in the left lower lobe. There is subtle airspace opacity in the right base, slightly increased compared to recent study. Underlying emphysematous changes are present. The heart size is normal. Pulmonary vascularity reflects underlying emphysema is stable. No adenopathy. There is aortic atherosclerosis. No bone lesions. IMPRESSION: Airspace opacity on the left, slightly increased. New subtle airspace opacity right base. Suspect multifocal pneumonia. Underlying emphysema. Stable cardiac silhouette. There is aortic atherosclerosis. Aortic Atherosclerosis (ICD10-I70.0) and Emphysema (ICD10-J43.9). Electronically Signed   By: Lowella Grip III M.D.   On: 08/20/2020 07:58    Scheduled Meds:  arformoterol  15 mcg Nebulization BID   aspirin  81 mg Oral Daily   budesonide  0.5 mg Nebulization BID   diltiazem  360 mg Oral Daily   doxycycline  100 mg Oral Q12H   enoxaparin (LOVENOX) injection  30 mg Subcutaneous Q24H   furosemide  40 mg Oral Daily   guaiFENesin  1,200 mg Oral  BID   hydrocerin   Topical Daily   ipratropium-albuterol  3 mL Nebulization Q4H   isosorbide mononitrate  30 mg Oral Daily   methylPREDNISolone (SOLU-MEDROL) injection  80 mg Intravenous Q6H   pantoprazole  40 mg Oral Q1200   Continuous Infusions:  ceFEPime (MAXIPIME) IV     magnesium sulfate bolus IVPB       LOS: 2 days   Time spent: 39 mins   Nylani Michetti Wynetta Emery, MD How to contact the [facility] Attending or Consulting provider [place] or covering provider during after hours [place], for this patient?  Check the care team in [facility] and look for a) attending/consulting TR[facility] provider listed and b) the [facility] team listed Log into [email or web address] and use [place]'s universal password to access. If you do not have the password, please contact the hospital operator. Locate the [facility] provider you are looking for under [facility] and page to a number that you can be directly reached. If you still have difficulty reaching the provider, please page the [facility] (Director on Call) for the Hospitalists listed on amion for assistance.  08/21/2020, 11:10 AM

## 2020-08-21 NOTE — Progress Notes (Signed)
CSW dropped off a list of in home care providers to pt room.  CSW also attempted to contact friend Sonja by phone, no answer.  Left message with the above as well as directing her to [email or web address] to locate in home aide help.  Sonja called back several minutes later but said she had just gotten up and would call CSW back shortly to discuss. Lurline Idol, MSW, LCSW 3/8/20228:45 AM

## 2020-08-22 DIAGNOSIS — J441 Chronic obstructive pulmonary disease with (acute) exacerbation: Secondary | ICD-10-CM | POA: Diagnosis not present

## 2020-08-22 DIAGNOSIS — R0600 Dyspnea, unspecified: Secondary | ICD-10-CM

## 2020-08-22 DIAGNOSIS — J189 Pneumonia, unspecified organism: Secondary | ICD-10-CM | POA: Diagnosis not present

## 2020-08-22 DIAGNOSIS — J9621 Acute and chronic respiratory failure with hypoxia: Secondary | ICD-10-CM | POA: Diagnosis not present

## 2020-08-22 DIAGNOSIS — J449 Chronic obstructive pulmonary disease, unspecified: Secondary | ICD-10-CM | POA: Diagnosis not present

## 2020-08-22 DIAGNOSIS — I4891 Unspecified atrial fibrillation: Secondary | ICD-10-CM | POA: Diagnosis not present

## 2020-08-22 LAB — CBC
HCT: 26.8 % — ABNORMAL LOW (ref 36.0–46.0)
Hemoglobin: 8.9 g/dL — ABNORMAL LOW (ref 12.0–15.0)
MCH: 32.1 pg (ref 26.0–34.0)
MCHC: 33.2 g/dL (ref 30.0–36.0)
MCV: 96.8 fL (ref 80.0–100.0)
Platelets: 506 10*3/uL — ABNORMAL HIGH (ref 150–400)
RBC: 2.77 MIL/uL — ABNORMAL LOW (ref 3.87–5.11)
RDW: 16.2 % — ABNORMAL HIGH (ref 11.5–15.5)
WBC: 19.1 10*3/uL — ABNORMAL HIGH (ref 4.0–10.5)
nRBC: 0.6 % — ABNORMAL HIGH (ref 0.0–0.2)

## 2020-08-22 LAB — BASIC METABOLIC PANEL
Anion gap: 13 (ref 5–15)
BUN: 26 mg/dL — ABNORMAL HIGH (ref 8–23)
CO2: 23 mmol/L (ref 22–32)
Calcium: 8.3 mg/dL — ABNORMAL LOW (ref 8.9–10.3)
Chloride: 97 mmol/L — ABNORMAL LOW (ref 98–111)
Creatinine, Ser: 0.74 mg/dL (ref 0.44–1.00)
GFR, Estimated: >60 mL/min (ref >60)
Glucose, Bld: 185 mg/dL — ABNORMAL HIGH (ref 70–99)
Potassium: 3.3 mmol/L — ABNORMAL LOW (ref 3.5–5.1)
Sodium: 133 mmol/L — ABNORMAL LOW (ref 135–145)

## 2020-08-22 LAB — MAGNESIUM: Magnesium: 2.2 mg/dL (ref 1.7–2.4)

## 2020-08-22 MED ORDER — METHYLPREDNISOLONE SODIUM SUCC 125 MG IJ SOLR: 60.0000 mg | Freq: Four times a day (QID) | INTRAMUSCULAR | Status: DC

## 2020-08-22 MED ORDER — GLYCOPYRROLATE 0.2 MG/ML IJ SOLN: 0.2000 mg | INTRAMUSCULAR | Status: DC | PRN

## 2020-08-22 MED ORDER — BIOTENE DRY MOUTH MT LIQD: 15.0000 mL | OROMUCOSAL | Status: DC | PRN

## 2020-08-22 MED ORDER — POTASSIUM CHLORIDE CRYS ER 20 MEQ PO TBCR
40.0000 meq | EXTENDED_RELEASE_TABLET | Freq: Once | ORAL | Status: AC
  Administered 2020-08-22: 40 meq via ORAL
  Filled 2020-08-22: qty 2

## 2020-08-22 MED ORDER — SODIUM CHLORIDE 0.9 % IV SOLN
1.0000 g | INTRAVENOUS | Status: DC
  Filled 2020-08-22: qty 10

## 2020-08-22 MED ORDER — ONDANSETRON HCL 4 MG/2ML IJ SOLN: 4.0000 mg | Freq: Four times a day (QID) | INTRAMUSCULAR | Status: DC | PRN

## 2020-08-22 MED ORDER — ACETAMINOPHEN 650 MG RE SUPP: 650.0000 mg | Freq: Four times a day (QID) | RECTAL | Status: DC | PRN

## 2020-08-22 MED ORDER — POLYVINYL ALCOHOL 1.4 % OP SOLN
1.0000 [drp] | Freq: Four times a day (QID) | OPHTHALMIC | Status: DC | PRN
  Filled 2020-08-22: qty 15

## 2020-08-22 MED ORDER — MORPHINE SULFATE (CONCENTRATE) 10 MG/0.5ML PO SOLN
5.0000 mg | ORAL | Status: DC
  Administered 2020-08-22 – 2020-08-23 (×7): 5 mg via SUBLINGUAL
  Filled 2020-08-22 (×7): qty 0.5

## 2020-08-22 MED ORDER — MORPHINE SULFATE (PF) 2 MG/ML IV SOLN
2.0000 mg | INTRAVENOUS | Status: DC | PRN
  Administered 2020-08-23: 2 mg via INTRAVENOUS
  Filled 2020-08-22: qty 1

## 2020-08-22 MED ORDER — ACETAMINOPHEN 325 MG PO TABS: 650.0000 mg | ORAL_TABLET | Freq: Four times a day (QID) | ORAL | Status: DC | PRN

## 2020-08-22 MED ORDER — LIP MEDEX EX OINT
TOPICAL_OINTMENT | CUTANEOUS | Status: DC | PRN
  Filled 2020-08-22: qty 7

## 2020-08-22 MED ORDER — ONDANSETRON 4 MG PO TBDP: 4.0000 mg | ORAL_TABLET | Freq: Four times a day (QID) | ORAL | Status: DC | PRN

## 2020-08-22 NOTE — Progress Notes (Signed)
Manufacturing engineer [facility])  Referral received for residential hospice at [facility].  Per [facility] MD, she is not eligible for residential hospice, but is eligible for hospice at a LTC facility or at a home.  Thank you, Venia Carbon RN, BSN, [facility] Liaison

## 2020-08-22 NOTE — Progress Notes (Signed)
Patient ID: [name], female   DOB: 14-Mar-1946, 75 y.o.   MRN: [id number]  PROGRESS NOTE    [name]  PJK:[id number] DOB: [birth year] DOA: 08/18/2020 PCP: Christain Sacramento, MD   Brief Narrative:  75 y.o.femalewith medical history significant foralcohol abuse, COPD, chronic hypoxic respiratory failure on 4-5 L/min supplemental oxygen, pulmonary hypertension, atrial fibrillation not anticoagulated due to bleeding risk, and admission for COVID-19 infection and COPD exacerbation last month presented with worsening shortness of breath and fever.  Patient was initially placed on nonrebreather.  On presentation to the ED, she was mildly tachypneic, tachycardic and requiring 6 L oxygen via nasal cannula.  Chest x-ray was concerning for severe emphysema and left-sided pneumonia with leukocytosis.  She was treated with IV Solu-Medrol and antibiotics.  Palliative care was consulted.  Assessment & Plan:   Severe sepsis: Present on admission; sepsis has now resolved Multifocal pneumonia -Currently on cefepime, doxycycline and vancomycin.  DC vancomycin and switch cefepime to Rocephin. -Cultures negative so far.  Repeat procalcitonin in a.m.  COPD exacerbation Acute on chronic hypercapnic/hypoxic respiratory failure -Patient has end-stage COPD and is normally on 4 to 5 L oxygen via nasal cannula at home. -Currently on 8 L high flow nasal cannula oxygen -Decrease Solu-Medrol to 60 mg IV every 6 hours.  Continue nebs  Paroxysmal A. fib with RVR -Still intermittently tachycardic.  Continue Cardizem.  Not on anticoagulation due to bleeding risk  Chronic pulmonary hypertension -Continue Lasix  Leukocytosis -Monitor.  Worsening  Hypokalemia -Replace.  Repeat a.m. labs  Hyponatremia -Monitor.  Thrombocytosis -Probably reactive.  Monitor  Anemia of chronic disease -Probably from respiratory failure/COPD.  Hemoglobin stable.  No signs of bleeding  Stage II pressure injury of the left  heel: Present on admission -Continue local wound care  Generalized deconditioning Goals of care --Respiratory status is not improving. -Palliative care following. -I discussed with the patient and recommended total comfort measures/residential hospice as overall prognosis is very poor   DVT prophylaxis: Lovenox Code Status: DNR Family Communication: None at bedside Disposition Plan: Status is: Inpatient  Remains inpatient appropriate because:Inpatient level of care appropriate due to severity of illness   Dispo: The patient is from: Home              Anticipated d/c is to: Home with possibly home hospice versus residential hospice              Patient currently is not medically stable to d/c.   Difficult to place patient No   Consultants: Palliative care  Procedures: None  Antimicrobials:  Anti-infectives (From admission, onward)   Start     Dose/Rate Route Frequency Ordered Stop   08/21/20 1400  ceFEPIme (MAXIPIME) 1 g in sodium chloride 0.9 % 100 mL IVPB  Status:  Discontinued        1 g 200 mL/hr over 30 Minutes Intravenous Every 8 hours 08/21/20 1108 08/21/20 1123   08/21/20 1400  vancomycin (VANCOREADY) IVPB 1000 mg/200 mL        1,000 mg 200 mL/hr over 60 Minutes Intravenous Every 36 hours 08/21/20 1149     08/21/20 1200  ceFEPIme (MAXIPIME) 2 g in sodium chloride 0.9 % 100 mL IVPB        2 g 200 mL/hr over 30 Minutes Intravenous Every 12 hours 08/21/20 1124     08/20/20 2000  doxycycline (VIBRA-TABS) tablet 100 mg        100 mg Oral Every 12 hours 08/20/20 1508  08/19/20 2300  vancomycin (VANCOREADY) IVPB 750 mg/150 mL  Status:  Discontinued        750 mg 150 mL/hr over 60 Minutes Intravenous Every 24 hours 08/18/20 2306 08/19/20 1406   08/19/20 2300  ceFEPIme (MAXIPIME) 2 g in sodium chloride 0.9 % 100 mL IVPB  Status:  Discontinued        2 g 200 mL/hr over 30 Minutes Intravenous Every 24 hours 08/19/20 0323 08/19/20 1406   08/19/20 2000  doxycycline  (VIBRAMYCIN) 100 mg in sodium chloride 0.9 % 250 mL IVPB  Status:  Discontinued        100 mg 125 mL/hr over 120 Minutes Intravenous Every 12 hours 08/19/20 1406 08/20/20 1508   08/19/20 0400  azithromycin (ZITHROMAX) 500 mg in sodium chloride 0.9 % 250 mL IVPB  Status:  Discontinued        500 mg 250 mL/hr over 60 Minutes Intravenous Every 24 hours 08/19/20 0300 08/19/20 1406   08/18/20 2315  ceFEPIme (MAXIPIME) 2 g in sodium chloride 0.9 % 100 mL IVPB        2 g 200 mL/hr over 30 Minutes Intravenous  Once 08/18/20 2302 08/19/20 0048   08/18/20 2315  vancomycin (VANCOREADY) IVPB 1000 mg/200 mL        1,000 mg 200 mL/hr over 60 Minutes Intravenous  Once 08/18/20 2304 08/19/20 0149       Subjective: Patient seen and examined at bedside.  Still feels short of breath with minimal exertion.  No overnight fever, vomiting or abdominal pain reported  Objective: Vitals:   08/22/20 0724 08/22/20 0725 08/22/20 0812 08/22/20 1112  BP:      Pulse:   (!) 123   Resp:   17   Temp:      TempSrc:      SpO2: (!) 84% (!) 84% 92% 92%  Weight:      Height:        Intake/Output Summary (Last 24 hours) at 08/22/2020 1345 Last data filed at 08/22/2020 1257 Gross per 24 hour  Intake 460.87 ml  Output 450 ml  Net 10.87 ml   Filed Weights   08/19/20 1714  Weight: 46.4 kg    Examination:  General exam: Looks chronically ill and very deconditioned and very thinly built.  Currently on 8 L high flow nasal cannula oxygen.   Respiratory system: Bilateral decreased breath sounds at bases with scattered crackles Cardiovascular system: S1 & S2 heard, tachycardic Gastrointestinal system: Abdomen is nondistended, soft and nontender. Normal bowel sounds heard. Extremities: No cyanosis, clubbing, edema  Central nervous system: Alert and oriented. No focal neurological deficits. Moving extremities Skin: No rashes, lesions or ulcers Psychiatry: Flat affect    Data Reviewed: I have personally reviewed  following labs and imaging studies  CBC: Recent Labs  Lab 08/18/20 2249 08/18/20 2327 08/19/20 0825 08/20/20 0201 08/21/20 0043 08/22/20 0220  WBC 22.7*  --  17.9* 14.7* 12.9* 19.1*  NEUTROABS 19.8*  --   --   --   --   --   HGB 9.6* 10.5* 9.3* 8.2* 7.6* 8.9*  HCT 29.4* 31.0* 29.7* 24.9* 23.2* 26.8*  MCV 101.0*  --  101.4* 97.6 97.9 96.8  PLT 273  --  257 298 369 659*   Basic Metabolic Panel: Recent Labs  Lab 08/18/20 2249 08/18/20 2327 08/19/20 0825 08/20/20 0201 08/21/20 0043 08/22/20 0220  NA 131* 130* 134* 135 134* 133*  K 6.1* 5.6* 4.6 3.9 3.7 3.3*  CL 96*  --  99 100 97* 97*  CO2 23  --  _0 GLUCOSE 168*  --  175* 146* 164* 185*  BUN 30*  --  23 30* 31* 26*  CREATININE 0.84  --  0.59 0.77 0.75 0.74  CALCIUM 9.0  --  8.6* 8.7* 8.5* 8.3*  MG  --   --   --  1.9 1.6* 2.2   GFR: Estimated Creatinine Clearance: 45.2 mL/min (by C-G formula based on SCr of 0.74 mg/dL). Liver Function Tests: Recent Labs  Lab 08/18/20 2249  AST 23  ALT 20  ALKPHOS 105  BILITOT 0.8  PROT 6.2*  ALBUMIN 2.3*   No results for input(s): LIPASE, AMYLASE in the last 168 hours. No results for input(s): AMMONIA in the last 168 hours. Coagulation Profile: No results for input(s): INR, PROTIME in the last 168 hours. Cardiac Enzymes: No results for input(s): CKTOTAL, CKMB, CKMBINDEX, TROPONINI in the last 168 hours. BNP (last 3 results) No results for input(s): PROBNP in the last 8760 hours. HbA1C: No results for input(s): HGBA1C in the last 72 hours. CBG: No results for input(s): GLUCAP in the last 168 hours. Lipid Profile: No results for input(s): CHOL, HDL, LDLCALC, TRIG, CHOLHDL, LDLDIRECT in the last 72 hours. Thyroid Function Tests: No results for input(s): TSH, T4TOTAL, FREET4, T3FREE, THYROIDAB in the last 72 hours. Anemia Panel: No results for input(s): VITAMINB12, FOLATE, FERRITIN, TIBC, IRON, RETICCTPCT in the last 72 hours. Sepsis Labs: Recent Labs  Lab  08/18/20 2302 08/19/20 0102 08/19/20 0825 08/20/20 0201  PROCALCITON  --   --  8.51 11.57  LATICACIDVEN 2.3* 1.2  --   --     Recent Results (from the past 240 hour(s))  Culture, blood (routine x 2)     Status: None (Preliminary result)   Collection Time: 08/18/20 11:16 PM   Specimen: BLOOD RIGHT FOREARM  Result Value Ref Range Status   Specimen Description BLOOD RIGHT FOREARM  Final   Special Requests   Final    BOTTLES DRAWN AEROBIC AND ANAEROBIC Blood Culture adequate volume   Culture   Final    NO GROWTH 3 DAYS Performed at [facility], [place] [address], [place], [place] [address]    Report Status PENDING  Incomplete  Culture, blood (routine x 2)     Status: None (Preliminary result)   Collection Time: 08/19/20  1:13 AM   Specimen: BLOOD LEFT HAND  Result Value Ref Range Status   Specimen Description BLOOD LEFT HAND  Final   Special Requests   Final    BOTTLES DRAWN AEROBIC AND ANAEROBIC Blood Culture results may not be optimal due to an excessive volume of blood received in culture bottles   Culture   Final    NO GROWTH 3 DAYS Performed at [facility], [place] [address], [place], [place] [address]    Report Status PENDING  Incomplete  Expectorated Sputum Assessment w Gram Stain, Rflx to Resp Cult     Status: None   Collection Time: 08/21/20 12:08 PM  Result Value Ref Range Status   Specimen Description EXPECTORATED SPUTUM  Final   Special Requests NONE  Final   Sputum evaluation   Final    THIS SPECIMEN IS ACCEPTABLE FOR SPUTUM CULTURE Performed at [facility], [place] [address] [place], [place] [address]    Report Status 08/21/2020 FINAL  Final  Culture, Respiratory w Gram Stain     Status: None (Preliminary result)  Collection Time: 08/21/20 12:08 PM  Result Value Ref Range Status   Specimen Description EXPECTORATED SPUTUM  Final   Special Requests NONE Reflexed from [id number]  Final   Gram Stain   Final    FEW WBC PRESENT,BOTH PMN AND  MONONUCLEAR MODERATE GRAM POSITIVE COCCI IN PAIRS RARE YEAST    Culture   Final    CULTURE REINCUBATED FOR BETTER GROWTH Performed at [facility], [place] [address] [place], [place] [address]    Report Status PENDING  Incomplete         Radiology Studies: No results found.      Scheduled Meds: . arformoterol  15 mcg Nebulization BID  . aspirin  81 mg Oral Daily  . budesonide  0.5 mg Nebulization BID  . diltiazem  360 mg Oral Daily  . doxycycline  100 mg Oral Q12H  . enoxaparin (LOVENOX) injection  30 mg Subcutaneous Q24H  . furosemide  40 mg Oral Daily  . guaiFENesin  1,200 mg Oral BID  . hydrocerin   Topical Daily  . ipratropium-albuterol  3 mL Nebulization Q4H  . isosorbide mononitrate  30 mg Oral Daily  . methylPREDNISolone (SOLU-MEDROL) injection  80 mg Intravenous Q6H  . pantoprazole  40 mg Oral Q1200  . saccharomyces boulardii  250 mg Oral BID   Continuous Infusions: . ceFEPime (MAXIPIME) IV 200 mL/hr at 08/22/20 1257  . vancomycin Stopped (08/21/20 1529)          Aline August, MD [facility] 08/22/2020, 1:45 PM

## 2020-08-22 NOTE — Progress Notes (Signed)
Daily Progress Note   Patient Name: [name]       Date: 08/22/2020 DOB: [birth year]  Age: 75 y.o. MRN#: [id number] Attending Physician: Aline August, MD Primary Care Physician: Christain Sacramento, MD Admit Date: 08/18/2020  Reason for Follow-up: continued GOC discussion, symptom management  Subjective: Note that patient was placed on 8L HFNC this morning due to spo2 84% on 6L.   11:30--Patient is very dyspneic and lethargic. Not able to speak in full sentences due to dyspnea. I called friend [name] and expressed concern that patient seems to be doing much worse today and appears to be at EOL.  I recommended transitioning to a comfort path and stopping full scope medical interventions to focus purely on comfort rather than prolonging life. I encouraged Sonja to come visit patient at bedside as soon as possible. [name] is wanting to obtain paperwork for durable POA, but is waiting on a return call from an attorney. Discussed that it would be unlikely for any attorney to feel comfortable with having patient sign legal documents in her current state. Sonja requests that I check back in a while to see if patient is more awake.  13:40--Patient is slightly more alert but still very dyspneic and somewhat confused. Still not able to effectively communicate in full sentences. I call [name] and place her on speaker phone in the room. Asked patient if she would be able and willing to sign POA documents if [name] were able to get the appropriate people to bedside. Patient states multiple times "maybe tomorrow, we'll do it tomorrow". I gently stated that we may miss our window of opportunity as tomorrow may not be an option. I verbalized to patient and [name] that my primary concern at this point was patient's comfort  as she appeared to be struggling to breath and suffering. I provided education that under comfort care we can administer additional medication as needed to ensure relief of pain and dyspnea. I encouraged transitioning to comfort care and transfer to residential hospice.   Provided education on transitioning to comfort care while in the hospital, and what that would look like--keeping her clean and dry, no labs, no artificial hydration or feeding, no antibiotics, minimizing of medications, comfort feeds, medication for pain and dyspnea.   During our discussion, I am met at bedside by  [place] hospice liaison (I had reached out to her earlier). She agrees that patient needs symptom management and would be appropriate to receive care at [facility]. [name] defers to patient to make the decision - patient is able to nod yes indicating that she agrees to comfort care and residential hospice.   Length of Stay: 3  Current Medications: Scheduled Meds:  . arformoterol  15 mcg Nebulization BID  . aspirin  81 mg Oral Daily  . budesonide  0.5 mg Nebulization BID  . diltiazem  360 mg Oral Daily  . doxycycline  100 mg Oral Q12H  . enoxaparin (LOVENOX) injection  30 mg Subcutaneous Q24H  . furosemide  40 mg Oral Daily  . guaiFENesin  1,200 mg Oral BID  . hydrocerin   Topical Daily  . ipratropium-albuterol  3 mL Nebulization Q4H  . isosorbide mononitrate  30 mg Oral Daily  . methylPREDNISolone (SOLU-MEDROL) injection  80 mg Intravenous Q6H  . pantoprazole  40 mg Oral Q1200  . saccharomyces boulardii  250 mg Oral BID    Continuous Infusions: . ceFEPime (MAXIPIME) IV 2 g (08/22/20 0144)  . vancomycin Stopped (08/21/20 1529)    PRN Meds: albuterol, guaiFENesin-dextromethorphan, HYDROcodone-acetaminophen, lip balm, LORazepam, morphine injection  Physical Exam Vitals reviewed.  Constitutional:      General: She is in acute distress.     Appearance: She is ill-appearing.  Pulmonary:     Effort:  Tachypnea and accessory muscle usage present.  Neurological:     Mental Status: She is confused.     Motor: Weakness and tremor present.  Psychiatric:        Mood and Affect: Mood is anxious.             Vital Signs: BP 120/61 (BP Location: Left Arm)   Pulse (!) 123   Temp 98 F (36.7 C) (Oral)   Resp 17   Ht _0  (1.575 m)   Wt 46.4 kg   SpO2 92%   BMI 18.69 kg/m  SpO2: SpO2: 92 % O2 Device: O2 Device: High Flow Nasal Cannula O2 Flow Rate: O2 Flow Rate (L/min): 8 L/min  Intake/output summary:   Intake/Output Summary (Last 24 hours) at 08/22/2020 1238 Last data filed at 08/21/2020 1850 Gross per 24 hour  Intake 333.72 ml  Output 450 ml  Net -116.28 ml   LBM: Last BM Date: 08/19/20 Baseline Weight: Weight: 46.4 kg Most recent weight: Weight: 46.4 kg       Palliative Assessment/Data: PPS 20%       Palliative Care Assessment & Plan   HPI/Patient Profile:74 y.o.femalewith past medical history of end stage COPD, atrial fibrillation, failure to thrive, pulmonary hypertension, CAD, and severe protein calorie malnutrition presented to the ED on 08/18/20 from [facility] [place] rehab facility with complaints of shortness of breath. She wasadmitted on3/5/2022with sepsis secondary to pneumonia, acute on chronic hypoxic respiratory failure, atrial fibrillation with RVR.  PMT worked with patient during her previous admission from 2/11-2/22/22. She was hospice appropriate at that time; however, patient needs 24/7 care and does not have family/friends able [facility] Therefore, she discharged to rehab.  ED Course:Upon arrival to the ED, patient is found to be afebrile, saturating low to mid 90s on 6 L/min of supplemental oxygen, mildly tachypneic, tachycardic in the 120s, and with blood pressure 118/63. EKG features sinus tachycardia 322 and PVC. Chest x-ray is concerning for severe emphysema and left-sided  pneumonia.   Recommendations/Plan:  Full comfort measures initiated  DNR/DNI as previously documented  Transfer to [facility] when bed becomes available - TOC consult placed; TOC and hospice liaison notifed  Added orders for symptom management at EOL as well as discontinued orders that were not focused on comfort  Unrestricted visitation orders were placed per current [place] EOL visitation policy   Provide frequent assessments and administer PRN medications as clinically necessary to ensure EOL comfort  PMT will continue to follow holistically  Symptom Management:   Morphine CONCENTRATE 10 mg/0.23m oral solution 5 mg SL every 4 hours  Morphine 2 mg IV every 3 hours prn for breakthrough pain and/or dyspnea  Lorazepam (ATIVAN) prn for anxiety  Glycopyrrolate (ROBINUL) for excessive secretions  Ondansetron (ZOFRAN) prn for nausea  Polyvinyl alcohol (LIQUIFILM TEARS) prn for dry eyes  Antiseptic oral rinse (BIOTENE) prn for dry mouth   Goals of Care and Additional Recommendations:  Limitations on Scope of Treatment: Full Comfort Care  Code Status: DNR/DNI  Prognosis:   < 2 weeks  Discharge Planning:  Hospice facility  Care plan was discussed with Dr. AStarla Link   Thank you for allowing the Palliative Medicine Team to assist in the care of this patient.   Total Time 67 minutes Prolonged Time Billed  yes       Greater than 50%  of this time was spent counseling and coordinating care related to the above assessment and plan.  JLavena Bullion NP  Please contact Palliative Medicine Team phone at [phone] questions and concerns.

## 2020-08-22 NOTE — Progress Notes (Signed)
Pt placed on 8L HFNC due to SPO2 84% on 6L. Pt states she wears 8L at home. Sats are 92% on 8L.

## 2020-08-22 NOTE — Discharge Summary (Addendum)
Physician Discharge Summary  [name] IZT:[id number] DOB: 25-Oct-1945 DOA: 08/18/2020  PCP: Christain [place], MD  Admit date: 08/18/2020 Discharge date: 08/23/2020  Admitted From: Home Disposition: Residential hospice  Recommendations for Outpatient Follow-up:  1. Follow up with residential hospice at earliest [place]: No Equipment/Devices: Continue oxygen supplementation per comfort measures  Discharge Condition: Poor CODE STATUS: DNR Diet recommendation: Per comfort measures  Brief/Interim Summary: 75 y.o.femalewith medical history significant foralcohol abuse, COPD, chronic hypoxic respiratory failure on 4-5 L/min supplemental oxygen, pulmonary hypertension, atrial fibrillation not anticoagulated due to bleeding risk, and admission for COVID-19 infection and COPD exacerbation last month presented with worsening shortness of breath and fever.  Patient was initially placed on nonrebreather.  On presentation to the ED, she was mildly tachypneic, tachycardic and requiring 6 L oxygen via nasal cannula.  Chest x-ray was concerning for severe emphysema and left-sided pneumonia with leukocytosis.  She was treated with IV Solu-Medrol and antibiotics.  Palliative care was consulted.  Given the hospitalization, respiratory status deteriorated despite steroids and antibiotics.  After palliative care discussion, patient has agreed for residential hospice placement and comfort measures.  She will be discharged to residential hospice once bed is available.  Discharge Diagnoses:  Comfort measures only status Severe sepsis: Present on admission; sepsis has now resolved Multifocal pneumonia COPD exacerbation Acute on chronic hypercapnic/hypoxic respiratory failure Paroxysmal A. fib with RVR Chronic pulmonary hypertension Leukocytosis Hypokalemia Hyponatremia Thrombocytosis Anemia of chronic disease Stage II pressure injury of the left heel: Present on admission Generalized  deconditioning  Plan -As per the discussion above, she will be discharged to residential hospice once bed is available.  All nonessential medications will be discontinued.  Discharge Instructions   Allergies as of 08/23/2020      Reactions   Tramadol Other (See Comments)   GI UPSET      Medication List    STOP taking these medications   aspirin 81 MG chewable tablet   B-complex with vitamin C tablet   CAL MAG ZINC +D3 PO   Ensure   Flutter Devi   HYDROcodone-acetaminophen 7.5-325 MG tablet Commonly known as: NORCO   hydrOXYzine 25 MG tablet Commonly known as: ATARAX/VISTARIL   ipratropium 17 MCG/ACT inhaler Commonly known as: ATROVENT HFA   isosorbide mononitrate 30 MG 24 hr tablet Commonly known as: IMDUR   MILK OF MAGNESIA PO   pantoprazole 40 MG tablet Commonly known as: PROTONIX   potassium chloride 10 MEQ tablet Commonly known as: KLOR-CON   salmeterol 50 MCG/DOSE diskus inhaler Commonly known as: SEREVENT   spironolactone 25 MG tablet Commonly known as: ALDACTONE   triamcinolone 0.1 % Commonly known as: KENALOG     TAKE these medications   acetaminophen 325 MG tablet Commonly known as: TYLENOL Take 650 mg by mouth every 6 (six) hours as needed for mild pain or headache.   albuterol 108 (90 Base) MCG/ACT inhaler Commonly known as: VENTOLIN HFA Inhale 2 puffs into the lungs every 4 (four) hours as needed for wheezing or shortness of breath.   albuterol (2.5 MG/3ML) 0.083% nebulizer solution Commonly known as: PROVENTIL Take 3 mLs (2.5 mg total) by nebulization every 6 (six) hours as needed for wheezing or shortness of breath.   AQUAPHOR EX Apply topically. Apply to lower ext twice a day (avoid heels due to wounds / dressing)   bisacodyl 10 MG/30ML Enem Commonly known as: FLEET Place 10 mg rectally as needed for moderate constipation.   budesonide 0.5 MG/2ML  nebulizer solution Commonly known as: Pulmicort Take 2 mLs (0.5 mg total) by  nebulization in the morning and at bedtime. DX: J44.9   diltiazem 360 MG 24 hr capsule Commonly known as: CARDIZEM CD Take 360 mg by mouth daily.   furosemide 40 MG tablet Commonly known as: LASIX Take 40 mg by mouth daily.   guaiFENesin-dextromethorphan 100-10 MG/5ML syrup Commonly known as: ROBITUSSIN DM Take 10 mLs by mouth every 4 (four) hours as needed for cough.   LORazepam 0.5 MG tablet Commonly known as: ATIVAN Take 1 tablet (0.5 mg total) by mouth every 4 (four) hours as needed for anxiety or sleep.   morphine CONCENTRATE 10 MG/0.5ML Soln concentrated solution Place 0.25 mLs (5 mg total) under the tongue every 4 (four) hours.   ondansetron 4 MG tablet Commonly known as: ZOFRAN Take 4 mg by mouth every 6 (six) hours as needed for nausea/vomiting.   OXYGEN Inhale 4-5 L into the lungs continuous. TO MAINTAIN O2 SATURATION OF >90% for Hypoxia   polyvinyl alcohol 1.4 % ophthalmic solution Commonly known as: LIQUIFILM TEARS Place 1 drop into both eyes 4 (four) times daily as needed for dry eyes.        Allergies  Allergen Reactions  . Tramadol Other (See Comments)    GI UPSET    Consultations:  Palliative care   Procedures/Studies: CT ABDOMEN PELVIS W CONTRAST  Result Date: 07/27/2020 CLINICAL DATA:  Elevated liver function studies. EXAM: CT ABDOMEN AND PELVIS WITH CONTRAST TECHNIQUE: Multidetector CT imaging of the abdomen and pelvis was performed using the standard protocol following bolus administration of intravenous contrast. CONTRAST:  32m OMNIPAQUE IOHEXOL 300 MG/ML  SOLN COMPARISON:  None. FINDINGS: Lower chest: Moderate breathing motion artifact. There are emphysematous changes and pulmonary scarring but no definite infiltrates or effusions. The heart is within normal limits in size. Moderate aortic calcifications. Hepatobiliary: Diffuse and severe fatty infiltration of the liver with focal areas of more pronounced fatty change. No worrisome hepatic  lesions or intrahepatic biliary dilatation. The gallbladder is unremarkable. No common bile duct dilatation. Pancreas: Advanced pancreatic atrophy but no mass, inflammation or ductal dilatation. Spleen: Normal size.  No focal lesions. Adrenals/Urinary Tract: Mild nodularity of both adrenal glands but no worrisome lesions. Scattered low-attenuation renal lesions are likely benign cysts. No worrisome renal lesions or hydronephrosis. 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 The bladder is unremarkable. Stomach/Bowel: The stomach, duodenum, small bowel and colon are grossly normal. Vascular/Lymphatic: Advanced atherosclerotic calcifications involving the aorta and branch vessels but no aneurysm. The major venous structures are patent. No mesenteric or retroperitoneal mass or adenopathy. Small scattered lymph nodes are noted. Reproductive: The uterus and ovaries are unremarkable. Other: No pelvic mass or adenopathy. No free pelvic fluid collections. No inguinal mass or adenopathy. No abdominal wall hernia or subcutaneous lesions. Musculoskeletal: No significant bony findings. IMPRESSION: 1. Diffuse and severe fatty infiltration of the liver. 2. No acute abdominal/pelvic findings, mass lesions or adenopathy. 3. Advanced atherosclerotic calcifications involving the aorta and branch vessels. 4. 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 5. Emphysematous changes and pulmonary scarring at the lung bases. 6. Emphysema and aortic atherosclerosis. Aortic Atherosclerosis (ICD10-I70.0) and Emphysema (ICD10-J43.9). Electronically Signed   By: PMarijo SanesM.D.   On: 07/27/2020 19:46   DG CHEST PORT 1 VIEW  Result Date: 08/20/2020 CLINICAL DATA:  Shortness of breath and cough EXAM: PORTABLE CHEST 1 VIEW COMPARISON:  August 18, 2020 FINDINGS: There is  persistent airspace opacity in much of the left mid and lower lung regions. Compared to most recent study, there is  slightly more airspace opacity in the left lower lobe. There is subtle airspace opacity in the right base, slightly increased compared to recent study. Underlying emphysematous changes are present. The heart size is normal. Pulmonary vascularity reflects underlying emphysema is stable. No adenopathy. There is aortic atherosclerosis. No bone lesions. IMPRESSION: Airspace opacity on the left, slightly increased. New subtle airspace opacity right base. Suspect multifocal pneumonia. Underlying emphysema. Stable cardiac silhouette. There is aortic atherosclerosis. Aortic Atherosclerosis (ICD10-I70.0) and Emphysema (ICD10-J43.9). Electronically Signed   By: Lowella Grip III M.D.   On: 08/20/2020 07:58   DG Chest Port 1 View  Result Date: 08/18/2020 CLINICAL DATA:  Shortness of breath. EXAM: PORTABLE CHEST 1 VIEW COMPARISON:  Radiograph 08/01/2020.  CT 01/16/2020 FINDINGS: Extensive patchy airspace disease in the left mid lower lung zone. Streaky opacities in the right infrahilar lung. Slightly spiculated appearance in the right perihilar region. Advanced underlying emphysema. Normal heart size. No pneumothorax or large pleural effusion. The bones are subjectively under mineralized. IMPRESSION: Extensive patchy airspace disease in the left mid and lower lung zone, suspicious for pneumonia. Streaky right infrahilar opacities may be atelectasis or infection. Recommend radiographic follow-up to resolution. Advanced emphysema. Electronically Signed   By: Keith Rake M.D.   On: 08/18/2020 23:10   DG Chest Port 1 View  Result Date: 08/01/2020 CLINICAL DATA:  Shortness of breath.  COVID. EXAM: PORTABLE CHEST 1 VIEW COMPARISON:  CT 01/15/2020.  Chest x-ray 08/18/2019, 05/11/2018. FINDINGS: Mediastinum and hilar structures are normal. COPD. Chronic bilateral interstitial changes are noted. Bibasilar subsegmental atelectasis and or scarring again noted. No acute infiltrate noted. Previously noted pulmonary nodules  on CT of 01/16/2020 best evaluated by prior CT. No pleural effusion or pneumothorax. IMPRESSION: 1. COPD. Chronic bilateral interstitial changes and bibasilar subsegmental atelectasis and or scarring. 2. Previously noted pulmonary nodules noted on CT of 01/16/2020 best evaluated by CT. Reference is made to that report. Electronically Signed   By: Marcello Moores  Register   On: 08/01/2020 08:21   DG Chest Portable 1 View  Result Date: 07/27/2020 CLINICAL DATA:  SOB EXAM: PORTABLE CHEST 1 VIEW COMPARISON:  01/16/2020 and prior. FINDINGS: Emphysematous changes. Patchy right predominant basilar opacities. No pneumothorax or pleural effusion. Cardiomediastinal silhouette within normal limits. IMPRESSION: Right predominant basilar opacities, atelectasis versus infiltrate. Emphysema. Electronically Signed   By: Primitivo Gauze M.D.   On: 07/27/2020 15:25   VAS [place] LOWER EXTREMITY VENOUS (DVT)  Result Date: 07/30/2020  Lower Venous DVT Study Indications: Covid, d-dimer.  Anticoagulation: Lovenox. Limitations: Poor ultrasound/tissue interface due to superficial skin changes. Comparison Study: 03-20-2020 Prior LT lower extremity venous was negative for                   DVT. Performing Technologist: Darlin Coco RDMS  Examination Guidelines: A complete evaluation includes B-mode imaging, spectral Doppler, color Doppler, and power Doppler as needed of all accessible portions of each vessel. Bilateral testing is considered an integral part of a complete examination. Limited examinations for reoccurring indications may be performed as noted. The reflux portion of the exam is performed with the patient in reverse Trendelenburg.  +---------+---------------+---------+-----------+----------+-------------------+ RIGHT    CompressibilityPhasicitySpontaneityPropertiesThrombus Aging      +---------+---------------+---------+-----------+----------+-------------------+ CFV      Full           Yes      Yes                                       +---------+---------------+---------+-----------+----------+-------------------+  SFJ      Full                                                             +---------+---------------+---------+-----------+----------+-------------------+ FV Prox  Full                                                             +---------+---------------+---------+-----------+----------+-------------------+ FV Mid   Full                                                             +---------+---------------+---------+-----------+----------+-------------------+ FV DistalFull                                                             +---------+---------------+---------+-----------+----------+-------------------+ PFV      Full                                                             +---------+---------------+---------+-----------+----------+-------------------+ POP      Full           Yes      Yes                                      +---------+---------------+---------+-----------+----------+-------------------+ PTV      Full                                         Some segments not                                                         well visualized     +---------+---------------+---------+-----------+----------+-------------------+ PERO     Full                                         Some segments not  well visualized     +---------+---------------+---------+-----------+----------+-------------------+   +---------+---------------+---------+-----------+----------+-------------------+ LEFT     CompressibilityPhasicitySpontaneityPropertiesThrombus Aging      +---------+---------------+---------+-----------+----------+-------------------+ CFV      Full           Yes      Yes                                       +---------+---------------+---------+-----------+----------+-------------------+ SFJ      Full                                                             +---------+---------------+---------+-----------+----------+-------------------+ FV Prox  Full                                                             +---------+---------------+---------+-----------+----------+-------------------+ FV Mid   Full                                                             +---------+---------------+---------+-----------+----------+-------------------+ FV DistalFull                                                             +---------+---------------+---------+-----------+----------+-------------------+ PFV      Full                                                             +---------+---------------+---------+-----------+----------+-------------------+ POP      Full           Yes      Yes                                      +---------+---------------+---------+-----------+----------+-------------------+ PTV      Full                                         Some segments not                                                         well visualized     +---------+---------------+---------+-----------+----------+-------------------+ PERO     Full  Some segments not                                                         well visualized     +---------+---------------+---------+-----------+----------+-------------------+     Summary: RIGHT: - There is no evidence of deep vein thrombosis in the lower extremity. However, portions of this examination were limited- see technologist comments above.  - No cystic structure found in the popliteal fossa.  LEFT: - There is no evidence of deep vein thrombosis in the lower extremity. However, portions of this examination were limited- see technologist comments above.  - No cystic  structure found in the popliteal fossa.  *See table(s) above for measurements and observations. Electronically signed by Ruta Hinds MD on 07/30/2020 at 4:19:16 PM.    Final    ECHOCARDIOGRAM LIMITED  Result Date: 07/28/2020    ECHOCARDIOGRAM LIMITED REPORT   Patient [facility]:   [name] Date of Exam: 07/28/2020 Medical Rec #:  [id number]       Height:       62.0 in Accession #:    [id number]      Weight:       108.0 lb Date of Birth:  December 12, 1945       BSA:          1.471 m Patient Age:    35 years        BP:           114/78 mmHg Patient Gender: F               HR:           83 bpm. Exam Location:  Inpatient Procedure: Limited Echo, Limited Color Doppler and Cardiac Doppler Indications:    atrial fibrillation  History:        Patient has no prior history of Echocardiogram examinations.                 Covid and COPD; Risk Factors:Hypertension.  Sonographer:    Johny Chess Referring Phys: [facility]  1. Left ventricular ejection fraction, by estimation, is 55 to 60%. The left ventricle has normal function. The left ventricle has no regional wall motion abnormalities. Left ventricular diastolic parameters were normal.  2. Right ventricular systolic function is normal. The right ventricular size is mildly enlarged.  3. The mitral valve is normal in structure. No evidence of mitral valve regurgitation.  4. The aortic valve is tricuspid. Aortic valve regurgitation is not visualized. No aortic stenosis is present.  5. The inferior vena cava is dilated in size with >50% respiratory variability, suggesting right atrial pressure of 8 mmHg. Comparison(s): No prior Echocardiogram. FINDINGS  Left Ventricle: Left ventricular ejection fraction, by estimation, is 55 to 60%. The left ventricle has normal function. The left ventricle has no regional wall motion abnormalities. The left ventricular internal cavity size was normal in size. There is  no left ventricular hypertrophy. Left ventricular  diastolic parameters were normal. Right Ventricle: The right ventricular size is mildly enlarged. Right ventricular systolic function is normal. Mitral Valve: The mitral valve is normal in structure. Tricuspid Valve: The tricuspid valve is normal in structure. Tricuspid valve regurgitation is not demonstrated. Aortic Valve: The aortic valve is tricuspid. Aortic valve regurgitation is not visualized. No aortic stenosis is  present. Aorta: The aortic root is normal in size and structure and the ascending aorta was not well visualized. Venous: The inferior vena cava is dilated in size with greater than 50% respiratory variability, suggesting right atrial pressure of 8 mmHg. LEFT VENTRICLE PLAX 2D LVIDd:         4.40 cm  Diastology LVIDs:         2.50 cm  LV e' medial:    10.60 cm/s LV PW:         0.80 cm  LV E/e' medial:  7.9 LV IVS:        0.70 cm  LV e' lateral:   11.50 cm/s LVOT diam:     1.90 cm  LV E/e' lateral: 7.3 LV SV:         66 LV SV Index:   45 LVOT Area:     2.84 cm  IVC IVC diam: 2.40 cm LEFT ATRIUM         Index LA diam:    3.70 cm 2.51 cm/m  AORTIC VALVE LVOT Vmax:   137.00 cm/s LVOT Vmean:  87.500 cm/s LVOT VTI:    0.233 m  AORTA Ao Root diam: 2.90 cm MITRAL VALVE MV Area (PHT): 4.10 cm    SHUNTS MV Decel Time: 185 msec    Systemic VTI:  0.23 m MV E velocity: 83.60 cm/s  Systemic Diam: 1.90 cm MV A velocity: 71.10 cm/s MV E/A ratio:  1.18 Rudean Haskell MD Electronically signed by Rudean Haskell MD Signature Date/Time: 07/28/2020/5:07:15 PM    Final        Subjective: Patient seen and examined at bedside.  Still feels short of breath with minimal exertion.  No overnight fever, vomiting or abdominal pain reported  Discharge Exam: Vitals:   08/22/20 0812 08/22/20 1112  BP:    Pulse: (!) 123   Resp: 17   Temp:    SpO2: 92% 92%   General exam: Looks chronically ill and very deconditioned and very thinly built.  Currently on 8 L high flow nasal cannula oxygen.   Respiratory  system: Bilateral decreased breath sounds at bases with scattered crackles Cardiovascular system: S1 & S2 heard, tachycardic Gastrointestinal system: Abdomen is nondistended, soft and nontender. Normal bowel sounds heard. Extremities: No cyanosis, clubbing, edema  Central nervous system: Alert and oriented. No focal neurological deficits. Moving extremities Skin: No rashes, lesions or ulcers Psychiatry: Flat affect   The results of significant diagnostics from this hospitalization (including imaging, microbiology, ancillary and laboratory) are listed below for reference.     Microbiology: Recent Results (from the past 240 hour(s))  Culture, blood (routine x 2)     Status: None (Preliminary result)   Collection Time: 08/18/20 11:16 PM   Specimen: BLOOD RIGHT FOREARM  Result Value Ref Range Status   Specimen Description BLOOD RIGHT FOREARM  Final   Special Requests   Final    BOTTLES DRAWN AEROBIC AND ANAEROBIC Blood Culture adequate volume   Culture   Final    NO GROWTH 3 DAYS Performed at [facility], [address], [place], [place] [address]    Report Status PENDING  Incomplete  Culture, blood (routine x 2)     Status: None (Preliminary result)   Collection Time: 08/19/20  1:13 AM   Specimen: BLOOD LEFT HAND  Result Value Ref Range Status   Specimen Description BLOOD LEFT HAND  Final   Special Requests   Final    BOTTLES DRAWN AEROBIC AND  ANAEROBIC Blood Culture results may not be optimal due to an excessive volume of blood received in culture bottles   Culture   Final    NO GROWTH 3 DAYS Performed at [facility] [address], [place], [place] [address]    Report Status PENDING  Incomplete  Expectorated Sputum Assessment w Gram Stain, Rflx to Resp Cult     Status: None   Collection Time: 08/21/20 12:08 PM  Result Value Ref Range Status   Specimen Description EXPECTORATED SPUTUM  Final   Special Requests NONE  Final   Sputum evaluation   Final    THIS  SPECIMEN IS ACCEPTABLE FOR SPUTUM CULTURE Performed at [facility], [place] [address], [place], [place] [address]    Report Status 08/21/2020 FINAL  Final  Culture, Respiratory w Gram Stain     Status: None (Preliminary result)   Collection Time: 08/21/20 12:08 PM  Result Value Ref Range Status   Specimen Description EXPECTORATED SPUTUM  Final   Special Requests NONE Reflexed from [id number]  Final   Gram Stain   Final    FEW WBC PRESENT,BOTH PMN AND MONONUCLEAR MODERATE GRAM POSITIVE COCCI IN PAIRS RARE YEAST    Culture   Final    CULTURE REINCUBATED FOR BETTER GROWTH Performed at [facility], [place] [address] [place], [place] [address]    Report Status PENDING  Incomplete     Labs: BNP (last 3 results) Recent Labs    08/02/20 0045 08/03/20 0119 08/18/20 2249  BNP 236.8* 303.5* 503.8*   Basic Metabolic Panel: Recent Labs  Lab 08/18/20 2249 08/18/20 2327 08/19/20 0825 08/20/20 0201 08/21/20 0043 08/22/20 0220  NA 131* 130* 134* 135 134* 133*  K 6.1* 5.6* 4.6 3.9 3.7 3.3*  CL 96*  --  99 100 97* 97*  CO2 23  --  _0 GLUCOSE 168*  --  175* 146* 164* 185*  BUN 30*  --  23 30* 31* 26*  CREATININE 0.84  --  0.59 0.77 0.75 0.74  CALCIUM 9.0  --  8.6* 8.7* 8.5* 8.3*  MG  --   --   --  1.9 1.6* 2.2   Liver Function Tests: Recent Labs  Lab 08/18/20 2249  AST 23  ALT 20  ALKPHOS 105  BILITOT 0.8  PROT 6.2*  ALBUMIN 2.3*   No results for input(s): LIPASE, AMYLASE in the last 168 hours. No results for input(s): AMMONIA in the last 168 hours. CBC: Recent Labs  Lab 08/18/20 2249 08/18/20 2327 08/19/20 0825 08/20/20 0201 08/21/20 0043 08/22/20 0220  WBC 22.7*  --  17.9* 14.7* 12.9* 19.1*  NEUTROABS 19.8*  --   --   --   --   --   HGB 9.6* 10.5* 9.3* 8.2* 7.6* 8.9*  HCT 29.4* 31.0* 29.7* 24.9* 23.2* 26.8*  MCV 101.0*  --  101.4* 97.6 97.9 96.8  PLT 273  --  257 298 369 506*   Cardiac Enzymes: No results for input(s): CKTOTAL, CKMB,  CKMBINDEX, TROPONINI in the last 168 hours. BNP: Invalid input(s): POCBNP CBG: No results for input(s): GLUCAP in the last 168 hours. D-Dimer No results for input(s): DDIMER in the last 72 hours. Hgb A1c No results for input(s): HGBA1C in the last 72 hours. Lipid Profile No results for input(s): CHOL, HDL, LDLCALC, TRIG, CHOLHDL, LDLDIRECT in the last 72 hours. Thyroid function studies No results for input(s): TSH, T4TOTAL, T3FREE, THYROIDAB in the last 72  hours.  Invalid input(s): FREET3 Anemia work up No results for input(s): VITAMINB12, FOLATE, FERRITIN, TIBC, IRON, RETICCTPCT in the last 72 hours. Urinalysis    Component Value Date/Time   COLORURINE YELLOW 08/18/2019 2150   APPEARANCEUR CLOUDY (A) 08/18/2019 2150   LABSPEC 1.018 08/18/2019 2150   PHURINE 5.0 08/18/2019 2150   GLUCOSEU NEGATIVE 08/18/2019 2150   HGBUR NEGATIVE 08/18/2019 2150   BILIRUBINUR NEGATIVE 08/18/2019 2150   KETONESUR NEGATIVE 08/18/2019 2150   PROTEINUR NEGATIVE 08/18/2019 2150   NITRITE NEGATIVE 08/18/2019 2150   LEUKOCYTESUR MODERATE (A) 08/18/2019 2150   Sepsis Labs Invalid input(s): PROCALCITONIN,  WBC,  LACTICIDVEN Microbiology Recent Results (from the past 240 hour(s))  Culture, blood (routine x 2)     Status: None (Preliminary result)   Collection Time: 08/18/20 11:16 PM   Specimen: BLOOD RIGHT FOREARM  Result Value Ref Range Status   Specimen Description BLOOD RIGHT FOREARM  Final   Special Requests   Final    BOTTLES DRAWN AEROBIC AND ANAEROBIC Blood Culture adequate volume   Culture   Final    NO GROWTH 3 DAYS Performed at [facility], [place] [address] [place], [place] [address]    Report Status PENDING  Incomplete  Culture, blood (routine x 2)     Status: None (Preliminary result)   Collection Time: 08/19/20  1:13 AM   Specimen: BLOOD LEFT HAND  Result Value Ref Range Status   Specimen Description BLOOD LEFT HAND  Final   Special Requests   Final    BOTTLES DRAWN  AEROBIC AND ANAEROBIC Blood Culture results may not be optimal due to an excessive volume of blood received in culture bottles   Culture   Final    NO GROWTH 3 DAYS Performed at [facility], [place] [address], [place], [place] [address]    Report Status PENDING  Incomplete  Expectorated Sputum Assessment w Gram Stain, Rflx to Resp Cult     Status: None   Collection Time: 08/21/20 12:08 PM  Result Value Ref Range Status   Specimen Description EXPECTORATED SPUTUM  Final   Special Requests NONE  Final   Sputum evaluation   Final    THIS SPECIMEN IS ACCEPTABLE FOR SPUTUM CULTURE Performed at [facility], [place] [address], [place], [place] [address]    Report Status 08/21/2020 FINAL  Final  Culture, Respiratory w Gram Stain     Status: None (Preliminary result)   Collection Time: 08/21/20 12:08 PM  Result Value Ref Range Status   Specimen Description EXPECTORATED SPUTUM  Final   Special Requests NONE Reflexed from [id number]  Final   Gram Stain   Final    FEW WBC PRESENT,BOTH PMN AND MONONUCLEAR MODERATE GRAM POSITIVE COCCI IN PAIRS RARE YEAST    Culture   Final    CULTURE REINCUBATED FOR BETTER GROWTH Performed at [facility], [place] [address], [place], [place] [address]    Report Status PENDING  Incomplete     Time coordinating discharge: 35 minutes  SIGNED:   Aline August, MD  [facility] 08/22/2020, 2:51 PM

## 2020-08-23 DIAGNOSIS — I4891 Unspecified atrial fibrillation: Secondary | ICD-10-CM | POA: Diagnosis not present

## 2020-08-23 DIAGNOSIS — Z515 Encounter for palliative care: Secondary | ICD-10-CM | POA: Diagnosis not present

## 2020-08-23 DIAGNOSIS — J449 Chronic obstructive pulmonary disease, unspecified: Secondary | ICD-10-CM | POA: Diagnosis not present

## 2020-08-23 DIAGNOSIS — J189 Pneumonia, unspecified organism: Secondary | ICD-10-CM | POA: Diagnosis not present

## 2020-08-23 DIAGNOSIS — J441 Chronic obstructive pulmonary disease with (acute) exacerbation: Secondary | ICD-10-CM | POA: Diagnosis not present

## 2020-08-23 DIAGNOSIS — J9621 Acute and chronic respiratory failure with hypoxia: Secondary | ICD-10-CM | POA: Diagnosis not present

## 2020-08-23 LAB — CULTURE, RESPIRATORY W GRAM STAIN: Culture: NORMAL

## 2020-08-23 MED ORDER — MORPHINE SULFATE (CONCENTRATE) 10 MG/0.5ML PO SOLN: 5.0000 mg | ORAL | Status: AC

## 2020-08-23 MED ORDER — LORAZEPAM 0.5 MG PO TABS: 0.5000 mg | ORAL_TABLET | ORAL | Status: AC | PRN

## 2020-08-23 MED ORDER — POLYVINYL ALCOHOL 1.4 % OP SOLN
1.0000 [drp] | Freq: Four times a day (QID) | OPHTHALMIC | Status: AC | PRN
Start: 2020-08-23 — End: ?

## 2020-08-23 NOTE — Progress Notes (Signed)
Patient ID: [name], female   DOB: [birth year], 75 y.o.   MRN: [id number]  PROGRESS NOTE    [name]  JJO:[id number] DOB: May 09, 1946 DOA: 08/18/2020 PCP: Christain Sacramento, MD   Brief Narrative:  75 y.o.femalewith medical history significant foralcohol abuse, COPD, chronic hypoxic respiratory failure on 4-5 L/min supplemental oxygen, pulmonary hypertension, atrial fibrillation not anticoagulated due to bleeding risk, and admission for COVID-19 infection and COPD exacerbation last month presented with worsening shortness of breath and fever.  Patient was initially placed on nonrebreather.  On presentation to the ED, she was mildly tachypneic, tachycardic and requiring 6 L oxygen via nasal cannula.  Chest x-ray was concerning for severe emphysema and left-sided pneumonia with leukocytosis.  She was treated with IV Solu-Medrol and antibiotics.  Palliative care was consulted.  Due to overall poor prognosis and worsening general health, after discussion with palliative care, patient has decided to pursue residential hospice and she has been started on comfort measures.  Assessment & Plan:   Comfort measures only status  severe sepsis: Present on admission; sepsis has now resolved Multifocal pneumonia COPD exacerbation Acute on chronic hypercapnic/hypoxic respiratory failure Paroxysmal A. fib with RVR Chronic pulmonary hypertension Leukocytosis Hypokalemia Hyponatremia Thrombocytosis Anemia of chronic disease Stage II pressure injury of the left heel: Present on admission Generalized deconditioning  Plan of care - Due to overall poor prognosis and worsening general health, after discussion with palliative care, patient has decided to pursue residential hospice and she has been started on comfort measures.  Care management team has been consulted for residential hospice placement.   DVT prophylaxis: None per comfort measures Code Status: DNR Family Communication: None at  bedside Disposition Plan: Status is: Inpatient  Remains inpatient appropriate because:Inpatient level of care appropriate due to severity of illness   Dispo: The patient is from: Home              Anticipated d/c is to:  residential hospice once bed is available              Patient currently is medically stable to d/c.  To residential hospice   Difficult to place patient No   Consultants: Palliative care  Procedures: None  Antimicrobials:  Anti-infectives (From admission, onward)   Start     Dose/Rate Route Frequency Ordered Stop   08/22/20 1445  cefTRIAXone (ROCEPHIN) 1 g in sodium chloride 0.9 % 100 mL IVPB  Status:  Discontinued        1 g 200 mL/hr over 30 Minutes Intravenous Every 24 hours 08/22/20 1356 08/22/20 1446   08/21/20 1400  ceFEPIme (MAXIPIME) 1 g in sodium chloride 0.9 % 100 mL IVPB  Status:  Discontinued        1 g 200 mL/hr over 30 Minutes Intravenous Every 8 hours 08/21/20 1108 08/21/20 1123   08/21/20 1400  vancomycin (VANCOREADY) IVPB 1000 mg/200 mL  Status:  Discontinued        1,000 mg 200 mL/hr over 60 Minutes Intravenous Every 36 hours 08/21/20 1149 08/22/20 1356   08/21/20 1200  ceFEPIme (MAXIPIME) 2 g in sodium chloride 0.9 % 100 mL IVPB  Status:  Discontinued        2 g 200 mL/hr over 30 Minutes Intravenous Every 12 hours 08/21/20 1124 08/22/20 1356   08/20/20 2000  doxycycline (VIBRA-TABS) tablet 100 mg  Status:  Discontinued        100 mg Oral Every 12 hours 08/20/20 1508 08/22/20 1414   08/19/20 2300  vancomycin (VANCOREADY) IVPB 750 mg/150 mL  Status:  Discontinued        750 mg 150 mL/hr over 60 Minutes Intravenous Every 24 hours 08/18/20 2306 08/19/20 1406   08/19/20 2300  ceFEPIme (MAXIPIME) 2 g in sodium chloride 0.9 % 100 mL IVPB  Status:  Discontinued        2 g 200 mL/hr over 30 Minutes Intravenous Every 24 hours 08/19/20 0323 08/19/20 1406   08/19/20 2000  doxycycline (VIBRAMYCIN) 100 mg in sodium chloride 0.9 % 250 mL IVPB  Status:   Discontinued        100 mg 125 mL/hr over 120 Minutes Intravenous Every 12 hours 08/19/20 1406 08/20/20 1508   08/19/20 0400  azithromycin (ZITHROMAX) 500 mg in sodium chloride 0.9 % 250 mL IVPB  Status:  Discontinued        500 mg 250 mL/hr over 60 Minutes Intravenous Every 24 hours 08/19/20 0300 08/19/20 1406   08/18/20 2315  ceFEPIme (MAXIPIME) 2 g in sodium chloride 0.9 % 100 mL IVPB        2 g 200 mL/hr over 30 Minutes Intravenous  Once 08/18/20 2302 08/19/20 0048   08/18/20 2315  vancomycin (VANCOREADY) IVPB 1000 mg/200 mL        1,000 mg 200 mL/hr over 60 Minutes Intravenous  Once 08/18/20 2304 08/19/20 0149       Subjective: Patient seen and examined at bedside.  Poor historian.  Still feels very short of breath with minimal exertion.  No overnight fever or vomiting reported.   Objective: Vitals:   08/22/20 0812 08/22/20 1112 08/22/20 1504 08/22/20 1532  BP:   109/73   Pulse: (!) 123  71   Resp: 17  18   Temp:      TempSrc:      SpO2: 92% 92% 92% 91%  Weight:      Height:        Intake/Output Summary (Last 24 hours) at 08/23/2020 0723 Last data filed at 08/23/2020 0404 Gross per 24 hour  Intake 127.15 ml  Output 300 ml  Net -172.85 ml   Filed Weights   08/19/20 1714  Weight: 46.4 kg    Examination:  General exam: Looks chronically ill and very deconditioned and very thinly built.  Currently on 8 L high flow nasal cannula oxygen.  Wakes up slightly, does not participate in conversation much.  No acute distress. Respiratory system: Decreased breath sounds at bases bilaterally with some crackles Cardiovascular system: Rate controlled, S1-S2 heard   Data Reviewed: I have personally reviewed following labs and imaging studies  CBC: Recent Labs  Lab 08/18/20 2249 08/18/20 2327 08/19/20 0825 08/20/20 0201 08/21/20 0043 08/22/20 0220  WBC 22.7*  --  17.9* 14.7* 12.9* 19.1*  NEUTROABS 19.8*  --   --   --   --   --   HGB 9.6* 10.5* 9.3* 8.2* 7.6* 8.9*  HCT  29.4* 31.0* 29.7* 24.9* 23.2* 26.8*  MCV 101.0*  --  101.4* 97.6 97.9 96.8  PLT 273  --  257 298 369 462*   Basic Metabolic Panel: Recent Labs  Lab 08/18/20 2249 08/18/20 2327 08/19/20 0825 08/20/20 0201 08/21/20 0043 08/22/20 0220  NA 131* 130* 134* 135 134* 133*  K 6.1* 5.6* 4.6 3.9 3.7 3.3*  CL 96*  --  99 100 97* 97*  CO2 23  --  _0 GLUCOSE 168*  --  175* 146* 164* 185*  BUN 30*  --  23 30* 31* 26*  CREATININE 0.84  --  0.59 0.77 0.75 0.74  CALCIUM 9.0  --  8.6* 8.7* 8.5* 8.3*  MG  --   --   --  1.9 1.6* 2.2   GFR: Estimated Creatinine Clearance: 45.2 mL/min (by C-G formula based on SCr of 0.74 mg/dL). Liver Function Tests: Recent Labs  Lab 08/18/20 2249  AST 23  ALT 20  ALKPHOS 105  BILITOT 0.8  PROT 6.2*  ALBUMIN 2.3*   No results for input(s): LIPASE, AMYLASE in the last 168 hours. No results for input(s): AMMONIA in the last 168 hours. Coagulation Profile: No results for input(s): INR, PROTIME in the last 168 hours. Cardiac Enzymes: No results for input(s): CKTOTAL, CKMB, CKMBINDEX, TROPONINI in the last 168 hours. BNP (last 3 results) No results for input(s): PROBNP in the last 8760 hours. HbA1C: No results for input(s): HGBA1C in the last 72 hours. CBG: No results for input(s): GLUCAP in the last 168 hours. Lipid Profile: No results for input(s): CHOL, HDL, LDLCALC, TRIG, CHOLHDL, LDLDIRECT in the last 72 hours. Thyroid Function Tests: No results for input(s): TSH, T4TOTAL, FREET4, T3FREE, THYROIDAB in the last 72 hours. Anemia Panel: No results for input(s): VITAMINB12, FOLATE, FERRITIN, TIBC, IRON, RETICCTPCT in the last 72 hours. Sepsis Labs: Recent Labs  Lab 08/18/20 2302 08/19/20 0102 08/19/20 0825 08/20/20 0201  PROCALCITON  --   --  8.51 11.57  LATICACIDVEN 2.3* 1.2  --   --     Recent Results (from the past 240 hour(s))  Culture, blood (routine x 2)     Status: None (Preliminary result)   Collection Time: 08/18/20 11:16 PM    Specimen: BLOOD RIGHT FOREARM  Result Value Ref Range Status   Specimen Description BLOOD RIGHT FOREARM  Final   Special Requests   Final    BOTTLES DRAWN AEROBIC AND ANAEROBIC Blood Culture adequate volume   Culture   Final    NO GROWTH 3 DAYS Performed at [facility], [place] [address], [place], [place] [address]    Report Status PENDING  Incomplete  Culture, blood (routine x 2)     Status: None (Preliminary result)   Collection Time: 08/19/20  1:13 AM   Specimen: BLOOD LEFT HAND  Result Value Ref Range Status   Specimen Description BLOOD LEFT HAND  Final   Special Requests   Final    BOTTLES DRAWN AEROBIC AND ANAEROBIC Blood Culture results may not be optimal due to an excessive volume of blood received in culture bottles   Culture   Final    NO GROWTH 3 DAYS Performed at [facility], [place] [address], [place], [place] [address]    Report Status PENDING  Incomplete  Expectorated Sputum Assessment w Gram Stain, Rflx to Resp Cult     Status: None   Collection Time: 08/21/20 12:08 PM  Result Value Ref Range Status   Specimen Description EXPECTORATED SPUTUM  Final   Special Requests NONE  Final   Sputum evaluation   Final    THIS SPECIMEN IS ACCEPTABLE FOR SPUTUM CULTURE Performed at [facility], [place] [address], [place], [place] [address]    Report Status 08/21/2020 FINAL  Final  Culture, Respiratory w Gram Stain     Status: None (Preliminary result)   Collection Time: 08/21/20 12:08 PM  Result Value Ref Range Status   Specimen Description EXPECTORATED SPUTUM  Final   Special Requests NONE Reflexed from [id number]  Final  Gram Stain   Final    FEW WBC PRESENT,BOTH PMN AND MONONUCLEAR MODERATE GRAM POSITIVE COCCI IN PAIRS RARE YEAST    Culture   Final    CULTURE REINCUBATED FOR BETTER GROWTH Performed at [facility], [place] [address], [place], [place] [address]    Report Status PENDING  Incomplete         Radiology Studies: No results  found.      Scheduled Meds: . arformoterol  15 mcg Nebulization BID  . budesonide  0.5 mg Nebulization BID  . diltiazem  360 mg Oral Daily  . furosemide  40 mg Oral Daily  . guaiFENesin  1,200 mg Oral BID  . hydrocerin   Topical Daily  . ipratropium-albuterol  3 mL Nebulization Q4H  . morphine CONCENTRATE  5 mg Sublingual Q4H   Continuous Infusions:         Aline August, MD [facility] 08/23/2020, 7:23 AM

## 2020-08-23 NOTE — TOC Transition Note (Signed)
Transition of Care [facility]) - CM/SW Discharge Note   Patient Details  Name: [name] MRN: [id number] Date of Birth: 21-Sep-1945  [facility] Care [facility]) CM/SW Contact:  Joanne Chars, LCSW Phone Number: 08/23/2020, 5:20 PM   Clinical Narrative:  Pt discharging to [facility] residential hospice.  RN call report to [phone].      Final next level of care: [place] Barriers to Discharge: Barriers Resolved   Patient Goals and CMS Choice Patient states their goals for this hospitalization and ongoing recovery are:: comfot      Discharge Placement              Patient chooses bed at:  [facility]) Patient to be transferred to facility by: [facility] Name of family member notified: friend Becky Sax was notified by [facility] Patient and family notified of of transfer: 08/23/20  Discharge Plan and Services In-house Referral: Clinical Social Work   Post Acute Care Choice: Hospice                               Social Determinants of Health (SDOH) Interventions     Readmission Risk Interventions No flowsheet data found.

## 2020-08-23 NOTE — TOC Initial Note (Addendum)
Transition of Care [facility]) - Initial/Assessment Note    Patient Details  Name: [name] MRN: [id number] Date of Birth: February 06, 1946  Transition of Care [facility]) CM/SW Contact:    Joanne Chars, LCSW Phone Number: 08/23/2020, 9:21 AM  Clinical Narrative: Pt unable to participate in assessment today.  Information from palliative notes primarily.   Evangeline Dakin is identified support and pt has asked that daughter not be contacted.  CSW has had some contact with Becky Sax, who reports she is trying to get assistance from her daughter with making arrangements for additional support at home.  Pt was declined by [facility] place for admission yesterday.  CSW spoke with [facility] by phone and she has stopped efforts to arrange in home care in anticipation of pt going to residential hospice.    0950: CSW received phone call from [place] at [facility].  They would like to reevaluate pt for [facility] place tomorrow morning after pt has been off antibiotics for 24 hours.    1015: CSW made referral to Cassandra at [facility]                  Expected Discharge Plan: [place] Barriers to Discharge: Hospice Bed not available [facility] place did not accept referral)   Patient Goals and CMS Choice Patient states their goals for this hospitalization and ongoing recovery are:: comfot      Expected Discharge Plan and Services Expected Discharge Plan: [place] In-house Referral: Clinical Social Work   Post Acute Care Choice: Hospice Living arrangements for the past 2 months: [place]                                      Prior Living Arrangements/Services Living arrangements for the past 2 months: Single Family Home Lives with:: Self          Need for Family Participation in Patient Care: Yes (Comment) Care giver support system in place?: No (comment) Current home services: Other (comment) (none) Criminal Activity/Legal  Involvement Pertinent to Current Situation/Hospitalization: No - Comment as needed  Activities of Daily Living Home Assistive Devices/Equipment: None ADL Screening (condition at time of admission) Patient's cognitive ability adequate to safely complete daily activities?: Yes Is the patient deaf or have difficulty hearing?: No Does the patient have difficulty seeing, even when wearing glasses/contacts?: No Does the patient have difficulty concentrating, remembering, or making decisions?: No Patient able to express need for assistance with ADLs?: Yes Does the patient have difficulty dressing or bathing?: Yes Independently performs ADLs?: No Communication: Independent Dressing (OT): Needs assistance Is this a change from baseline?: Pre-admission baseline Grooming: Needs assistance Is this a change from baseline?: Pre-admission baseline Feeding: Independent Is this a change from baseline?: Pre-admission baseline Bathing: Needs assistance Is this a change from baseline?: Pre-admission baseline Is this a change from baseline?: Pre-admission baseline In/Out Bed: Needs assistance Is this a change from baseline?: Pre-admission baseline Is this a change from baseline?: Pre-admission baseline Does the patient have difficulty walking or climbing stairs?: Yes Weakness of Legs: Both Weakness of Arms/Hands: Both  Permission Sought/Granted                  Emotional Assessment Appearance:: Appears stated age Attitude/Demeanor/Rapport: Unable to Assess Affect (typically observed): Unable to Assess Orientation: : Fluctuating Orientation (Suspected and/or reported Sundowners) Alcohol / Substance Use: Not Applicable Psych Involvement: No (comment)  Admission diagnosis:  HCAP (healthcare-associated pneumonia) [J18.9] Sepsis due to pneumonia ([place]) [J18.9, A41.9] Patient Active Problem List   Diagnosis Date Noted  . Sepsis due to pneumonia ([place]) 08/19/2020  . DNR (do not resuscitate)  08/19/2020  . DNR (do not resuscitate) discussion 08/19/2020  . Wears partial dentures   . On home oxygen therapy   . Atrial fibrillation with RVR ([place]) 07/27/2020  . COVID-19 virus infection 07/27/2020  . Chest pain, musculoskeletal 02/14/2020  . Need for vaccination 10/05/2019  . Rheumatoid factor positive 09/19/2019  . 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 Protein-calorie malnutrition, severe 08/20/2019  . Severe sepsis ([place]) 08/19/2019  . Acute lower UTI 08/19/2019  . Pressure injury of skin 08/19/2019  . ARF (acute renal failure) ([place]) 08/18/2019  . Hyperkalemia 08/18/2019  . Elevated troponin 08/18/2019  . Generalized weakness 08/18/2019  . Leukocytosis 08/18/2019  . Macrocytic anemia 08/18/2019  . Acute renal failure (ARF) ([place]) 08/18/2019  . Acute on chronic respiratory failure with hypoxia ([place]) 06/02/2018  . COPD with acute exacerbation ([place]) 05/09/2018  . Hyponatremia 05/09/2018  . Acute urinary retention 05/09/2018  . Cystocele with prolapse 03/02/2018  . Cor pulmonale, chronic ([place]) 06/29/2017  . Chronic respiratory failure ([place]) 06/30/2014  . Pulmonary infiltrates 01/26/2013  . Multiple pulmonary nodules determined by computed tomography of lung 08/19/2012  . Cancer screening 05/12/2012  . Essential hypertension 03/28/2012  . COPD GOLD III 12/02/2011  . Chronic cough 12/02/2011  . Cigarette smoker 12/02/2011   PCP:  Christain Sacramento, MD Pharmacy:   [facility] [place], [place] - [place] [address] Phone: [phone] Fax: [place], [place] [address] [place] [address] Phone: [phone] Fax: [phone]  [place], [place]. [place] [address] Phone: [phone] Fax:  [place], [place] [address] Phone: [phone] Fax: [phone]     Social Determinants of Health (SDOH) Interventions    Readmission Risk Interventions No flowsheet data found.

## 2020-08-23 NOTE — Care Management Important Message (Signed)
Important Message  Patient Details  Name: [name] MRN: [id number] Date of Birth: Jul 01, 1945   Medicare Important Message Given:  Yes     Iris Bratton 08/23/2020, 9:10 AM

## 2020-08-23 NOTE — Progress Notes (Signed)
Manufacturing engineer [facility]) Hospital Liaison note.    Received request from [place] for family interest in [facility].  Chart reviewed and eligibility confirmed. Spoke with [facility] to confirm interest and explain services. Family agreeable to transfer today. TOC made aware in voicemail and Epic secure chat.    [facility] will notify TOC when registration paperwork has been completed to arrange transport.   RN please call report to [phone]. Please be sure a signed [place] DNR form transports with the patient.  Thank you for the opportunity to participate in this patient's care.  Domenic Moras, BSN, RN [facility] Liaison (listed on [place] under Hospice/[facility])    [phone] (24h on call)

## 2020-08-23 NOTE — Progress Notes (Signed)
This chaplain responded to PMT consult for notarizing HCPOA.    The Pt. wakes to the call of her name but is unable to remain awake.  The chaplain understands from communication with the Pt. RN-Josh, the Pt. had a dose of morphine earlier this morning.    This chaplain will attempt F/U and completion at a later time.

## 2020-08-23 NOTE — Progress Notes (Signed)
This chaplain is present at the Pt. bedside for notarizing of the Pt. HCPOA.  A notary is not available at this time.   The Pt is awake and able to tell me about her friendship with Elige Radon, the Pt. choice for HCPOA.  The Pt. alternated between swallows of ginger ale and water with the chaplain's assistance. The Pt. requested ice and prefers the cold water at the bottom of the cup.  This chaplain will F/U.

## 2020-08-23 NOTE — Progress Notes (Signed)
Daily Progress Note   Patient Name: [name]       Date: 08/23/2020 DOB: 06/05/1946  Age: 75 y.o. MRN#: [id number] Attending Physician: Aline August, MD Primary Care Physician: Christain Sacramento, MD Admit Date: 08/18/2020  Reason for Follow-up: end of life care, symptom management  Subjective: Received report from primary RN - patient is not eating or drinking.   At bedside, patient appears comfortable. She is difficult to arouse, but does open her eyes briefly when I say her name. She will not verbally respond to questions, but does nod her head yes when I asked if she was comfortable. No non-verbal signs of pain or discomfort noted. Respirations are even, unlabored, and shallow. No excessive respiratory secretions noted. No family present at bedside.   I spoke with friend Becky Sax by phone and let her know patient was comfortable. Discussed that prognosis was less than 2 weeks, as she is not eating or drinking at this point. Education and counseling provided on expectations at EOL. Emotional support provided.     Length of Stay: 4  Current Medications: Scheduled Meds:  . arformoterol  15 mcg Nebulization BID  . budesonide  0.5 mg Nebulization BID  . diltiazem  360 mg Oral Daily  . furosemide  40 mg Oral Daily  . guaiFENesin  1,200 mg Oral BID  . hydrocerin   Topical Daily  . ipratropium-albuterol  3 mL Nebulization Q4H  . morphine CONCENTRATE  5 mg Sublingual Q4H     PRN Meds: acetaminophen **OR** acetaminophen, albuterol, antiseptic oral rinse, glycopyrrolate **OR** glycopyrrolate, guaiFENesin-dextromethorphan, lip balm, LORazepam, morphine injection, ondansetron **OR** ondansetron (ZOFRAN) IV, polyvinyl alcohol            Vital Signs: BP 109/73 (BP Location: Left Arm)    Pulse 85   Temp 98 F (36.7 C) (Oral)   Resp 16   Ht _0  (1.575 m)   Wt 46.4 kg   SpO2 92%   BMI 18.69 kg/m  SpO2: SpO2: 92 % O2 Device: O2 Device: High Flow Nasal Cannula O2 Flow Rate: O2 Flow Rate (L/min): 5 L/min  Intake/output summary:   Intake/Output Summary (Last 24 hours) at 08/23/2020 1137 Last data filed at 08/23/2020 0404 Gross per 24 hour  Intake 127.15 ml  Output 300 ml  Net -172.85 ml  LBM: Last BM Date: 08/19/20 Baseline Weight: Weight: 46.4 kg Most recent weight: Weight: 46.4 kg       Palliative Assessment/Data: PPS 10-20%     Palliative Care Assessment & Plan   HPI/Patient Profile:74 y.o.femalewith past medical history of end stage COPD, atrial fibrillation, failure to thrive, pulmonary hypertension, CAD, and severe protein calorie malnutrition presented to the ED on 08/18/20 from [facility] rehab facility with complaints of shortness of breath. She wasadmitted on3/5/2022with sepsis secondary to pneumonia, acute on chronic hypoxic respiratory failure, atrial fibrillation with RVR.  PMT worked with patient during her previous admission from 2/11-2/22/22. She was hospice appropriate at that time; however, patient needs 24/7 care and does not have family/friends able to provide the [facility]. Therefore, she discharged to rehab.  ED Course:Upon arrival to the ED, patient is found to be afebrile, saturating low to mid 90s on 6 L/min of supplemental oxygen, mildly tachypneic, tachycardic in the 120s, and with blood pressure 118/63. EKG features sinus tachycardia 322 and PVC. Chest x-ray is concerning for severe emphysema and left-sided pneumonia.   Assessment: - severe sepsis, resolved - multifocal pneumonia - COPD exacerbation - acute on chronic hypercapnic/hypoxic respiratory failure - paroxysmal A.fib with RVR - chronic pulmonary hypertension - generalized deconditioning - dyspnea - end of life  care  Recommendations/Plan:  Continue full comfort measures  DNR/DNI as previously documented  Continue morphine CONCENTRATE 10 mg/0.94m oral solution 5 mg SL every 4 hours  Continue morphine 2 mg IV every 3 hours prn for breakthrough pain and/or dyspnea  Re-evaluate for residential hospice  Nursing provide frequent assessments and administer PRN medications as clinically necessary to ensure EOL comfort  Goals of Care and Additional Recommendations:  Limitations on Scope of Treatment: Full Comfort Care  Code Status: DNR/DNI  Prognosis:   < 2 weeks  Discharge Planning:  To Be Determined  Care plan was discussed with Dr. AStarla Link LCSW, hospice liaison  Thank you for allowing the Palliative Medicine Team to assist in the care of this patient.   Total Time 25 minutes Prolonged Time Billed  no       Greater than 50%  of this time was spent counseling and coordinating care related to the above assessment and plan.  JLavena Bullion NP  Please contact Palliative Medicine Team phone at [phone] questions and concerns.

## 2020-08-24 LAB — CULTURE, BLOOD (ROUTINE X 2)
Culture: NO GROWTH
Culture: NO GROWTH
Special Requests: ADEQUATE

## 2020-08-31 ENCOUNTER — Inpatient Hospital Stay: Payer: Medicare Other | Admitting: Internal Medicine

## 2020-09-14 DEATH — deceased

## 2020-09-20 ENCOUNTER — Ambulatory Visit: Payer: Medicare Other | Admitting: Internal Medicine

## 2020-09-28 ENCOUNTER — Other Ambulatory Visit: Payer: Self-pay | Admitting: Internal Medicine

## 2020-09-29 ENCOUNTER — Other Ambulatory Visit: Payer: Self-pay | Admitting: *Deleted

## 2020-09-29 MED ORDER — BUDESONIDE 0.5 MG/2ML IN SUSP: 0.5000 mg | Freq: Two times a day (BID) | RESPIRATORY_TRACT | 11 refills | Status: AC

## 2022-02-16 IMAGING — CR DG CHEST 2V
2 series · 2 of 2 positions shown · non-contrast
Comparison: Radiograph 05/15/2018, CT 03/13/2015

CLINICAL DATA: Shortness of breath and posterior rib pain, history
of COPD

EXAM:
CHEST - 2 VIEW

[w chest lat]
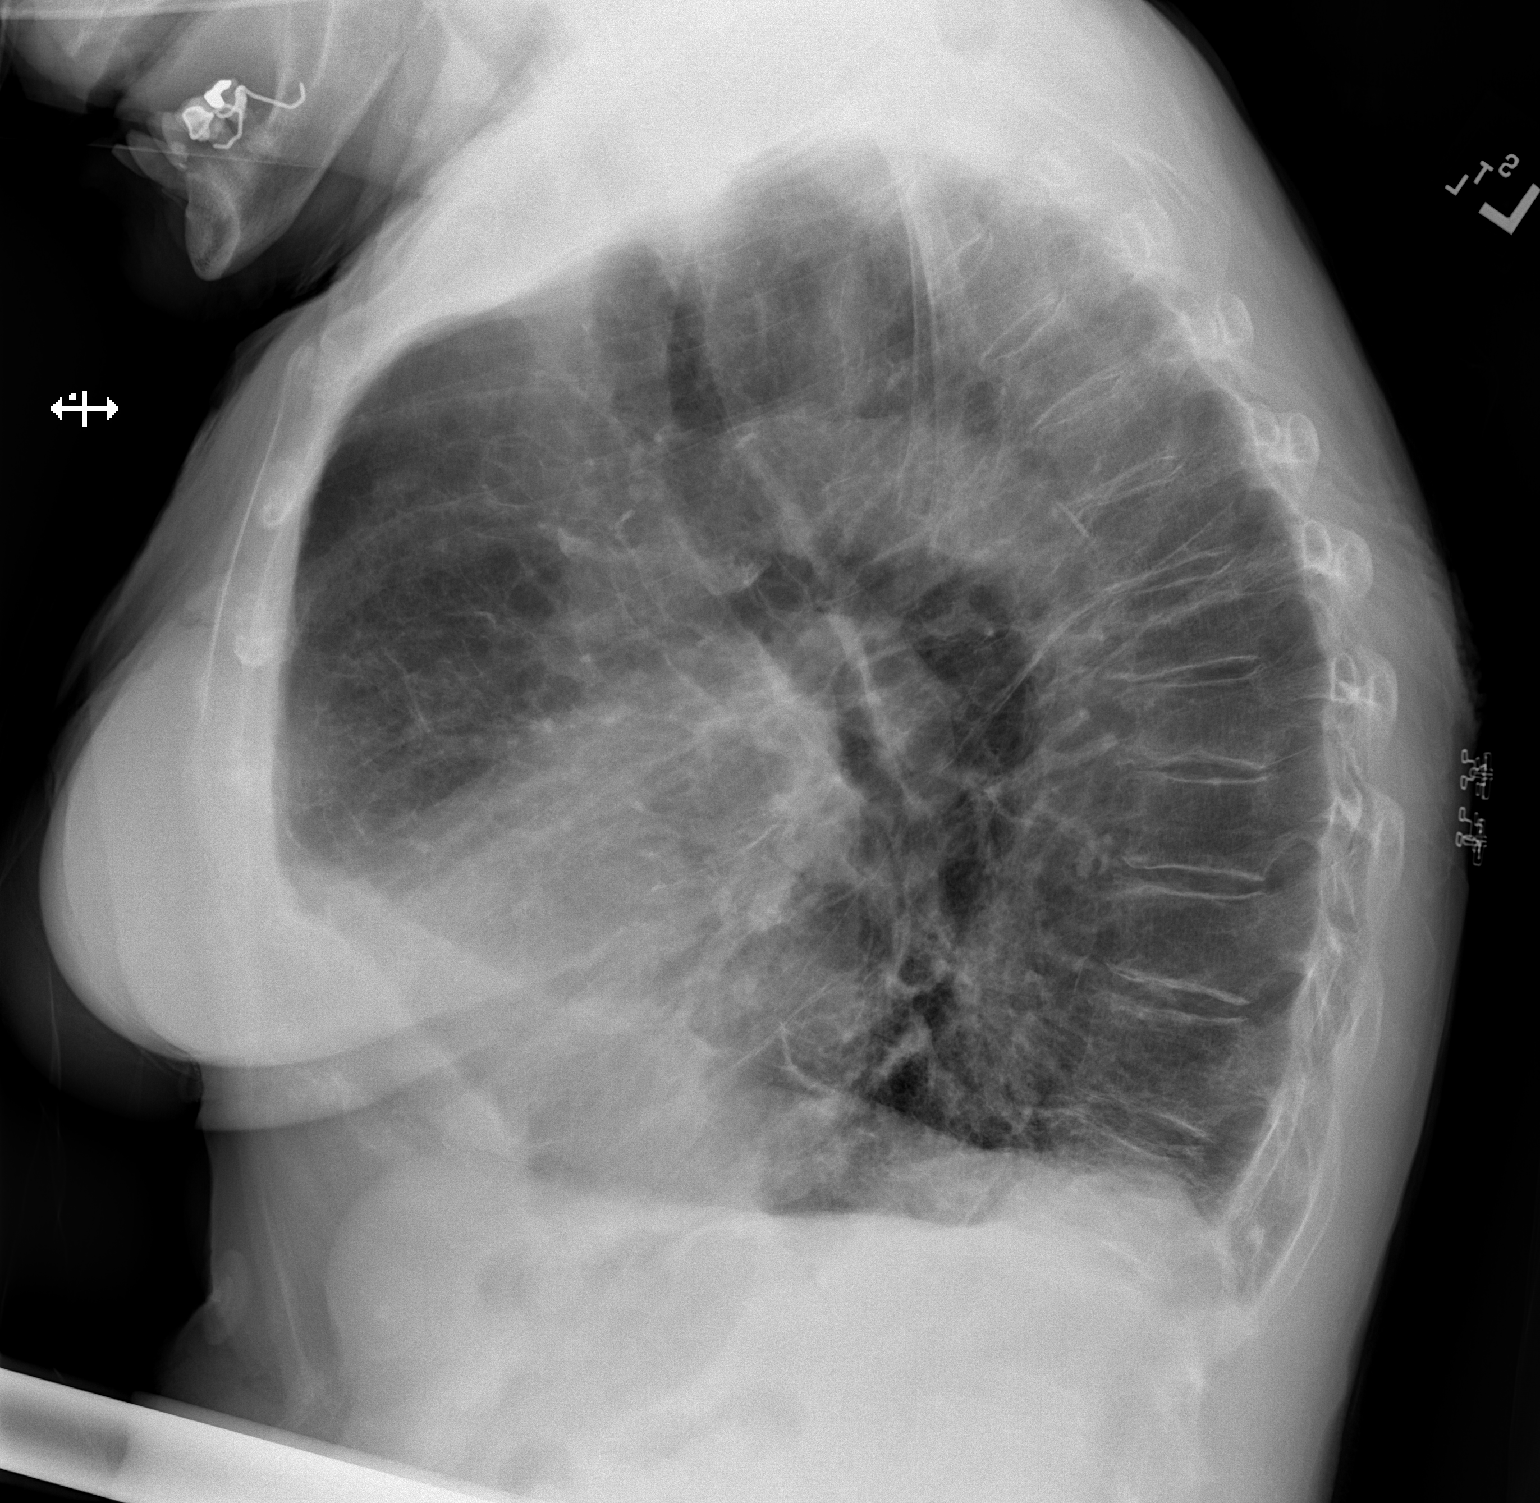

[x chest ap]
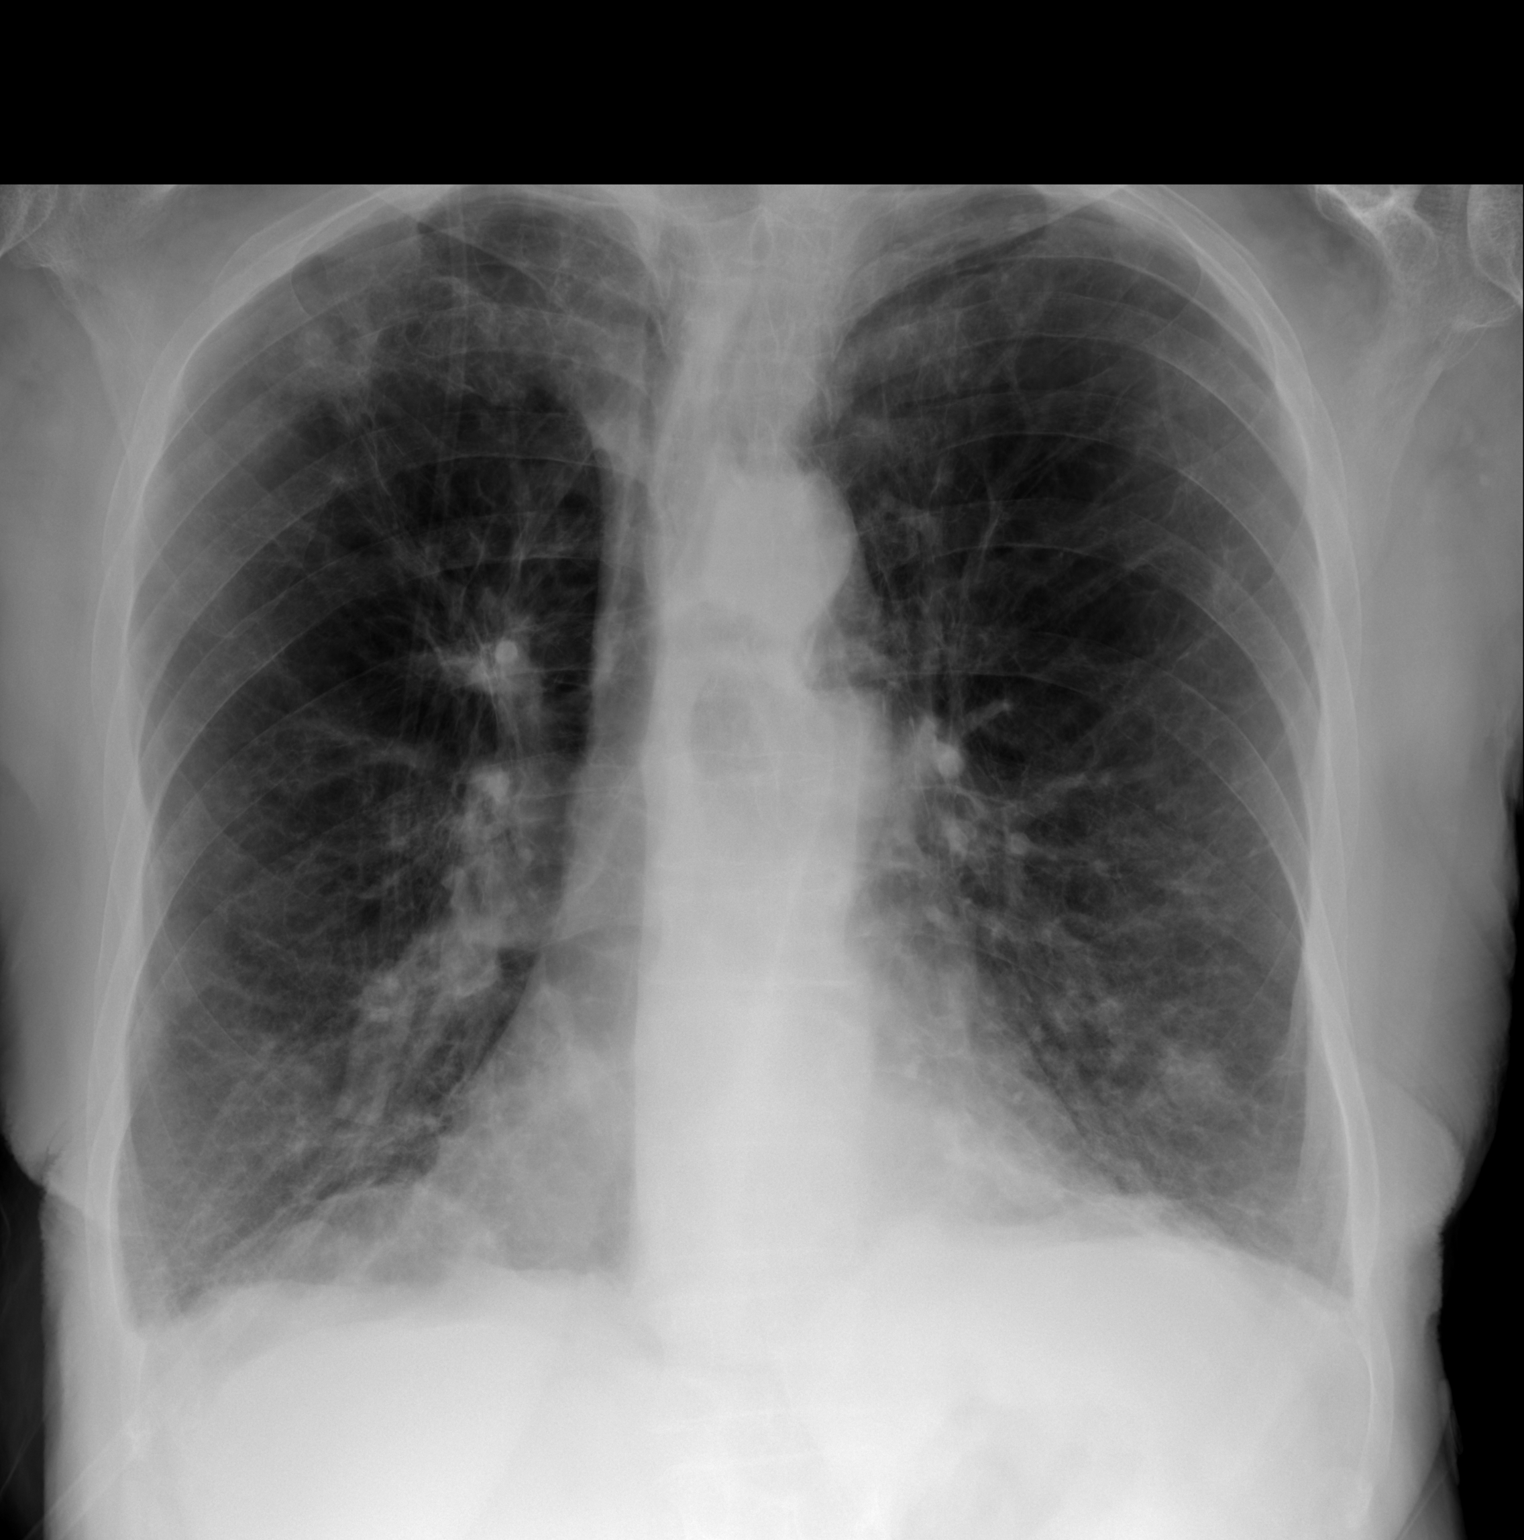

[2 of 2 positions shown; findings below may reference images not displayed]

FINDINGS: Chronic hyperinflation and coarsened interstitial changes towards
the bases with relative apical lucency. Increasing nodular opacity
in the left lung base. Rounded density projecting over the right
apex likely reflects a monitoring lead. Mild central vascular
congestion and redistribution with developing peripheral septal
lines may suggest superimposed edema. Blunting of the bilateral
costophrenic sulci could reflect hyperinflation or trace effusion.
No pneumothorax.
IMPRESSION: 1. Nodular masslike opacity in the left lung base, consider
evaluation with CT
2. Mild central vascular congestion and redistribution with
developing peripheral septal lines may suggest superimposed edema.
3. Chronic hyperinflation and coarsened interstitial changes towards
the bases.
4. Rounded opacity in the right apex and may reflect a monitoring
lead. Correlate with visual inspection.

## 2022-02-17 IMAGING — CT CT ANGIO CHEST
2 of 6 series · 18 of 36 positions shown · IV contrast (omnipaque)
Comparison: 03/13/2015

CLINICAL DATA: Abnormal chest x-ray. High probability for pulmonary
embolism. Chest pain and shortness of breath

EXAM:
CT ANGIOGRAPHY CHEST WITH CONTRAST
TECHNIQUE: Multidetector CT imaging of the chest was performed using the
standard protocol during bolus administration of intravenous
contrast. Multiplanar CT image reconstructions and MIPs were
obtained to evaluate the vascular anatomy.
CONTRAST:  100mL OMNIPAQUE IOHEXOL 350 MG/ML SOLN

[Series 5: thins · axial · 0.71mm/px · z∈[-416,-146]mm · 17 of 303 slices shown]
[im 17/303  lung]
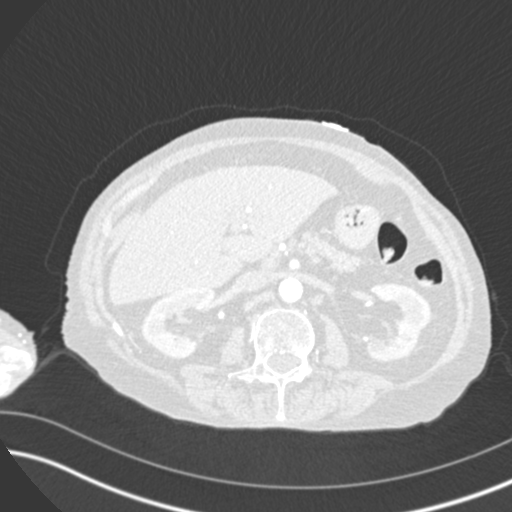
[im 34/303  mediastinal]
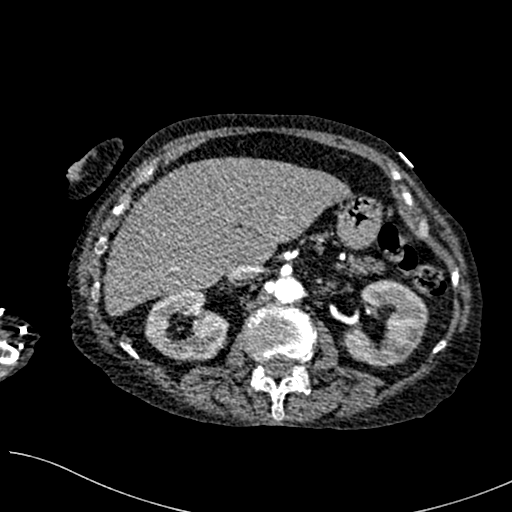
[im 51/303  lung]
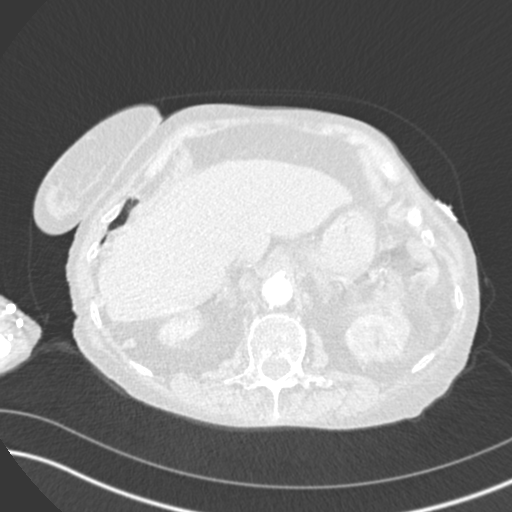
[im 68/303  mediastinal]
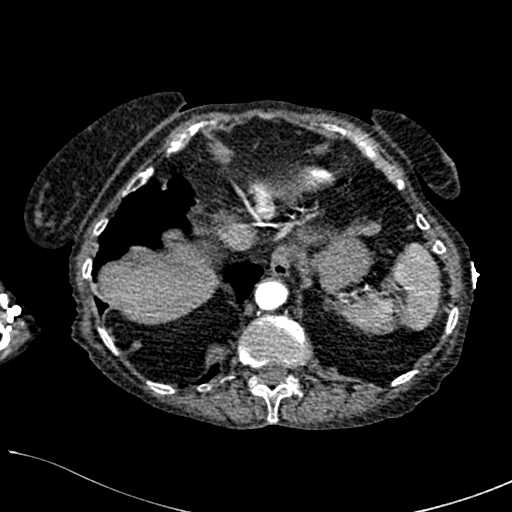
[im 84/303  lung]
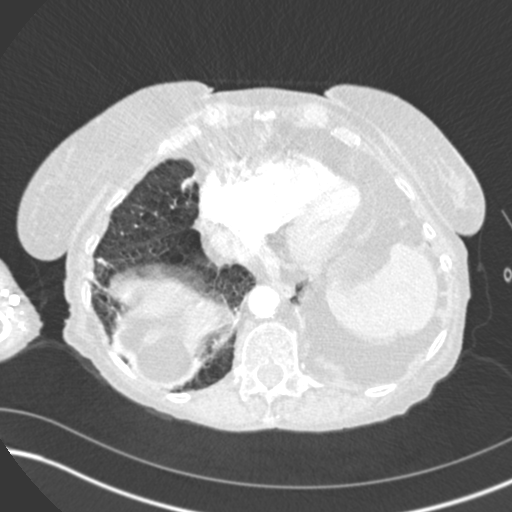
[im 101/303  mediastinal]
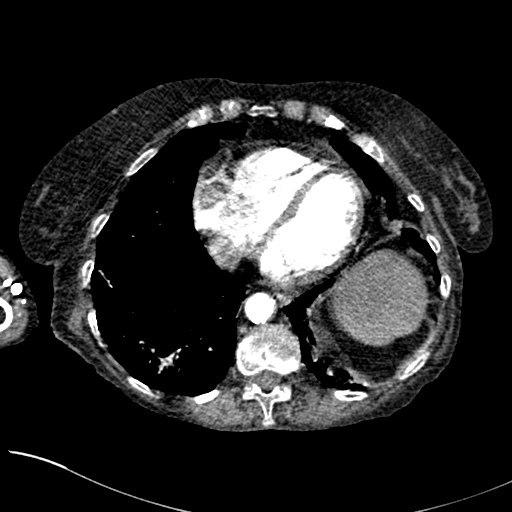
[im 118/303  lung]
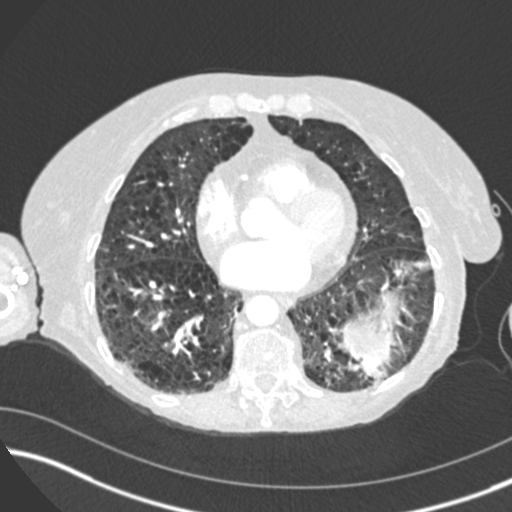
[im 135/303  mediastinal]
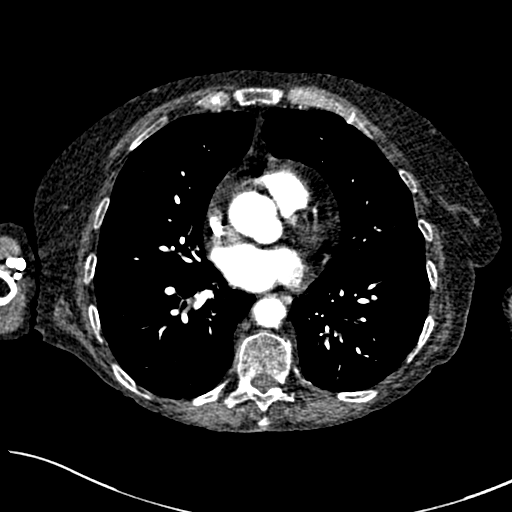
[im 152/303  lung]
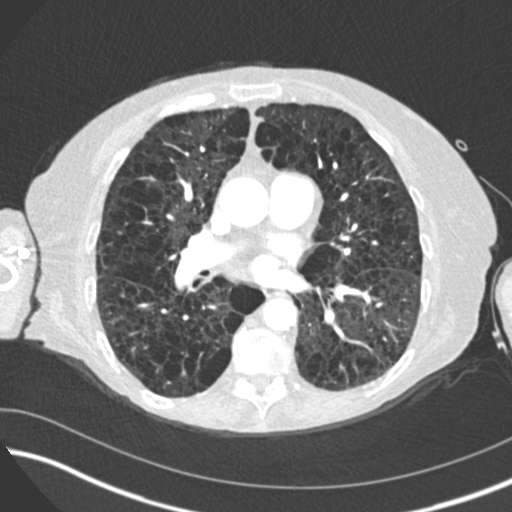
[im 168/303  mediastinal]
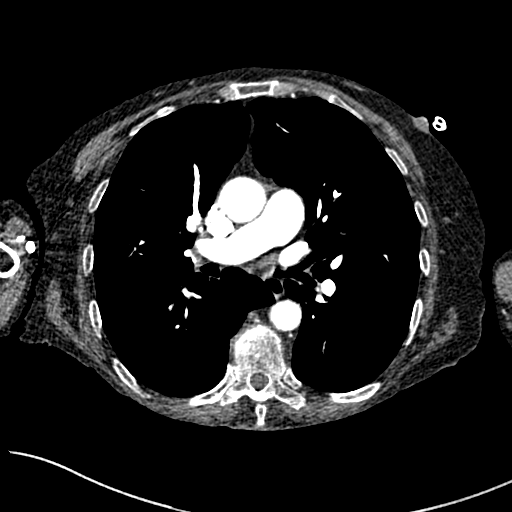
[im 185/303  lung]
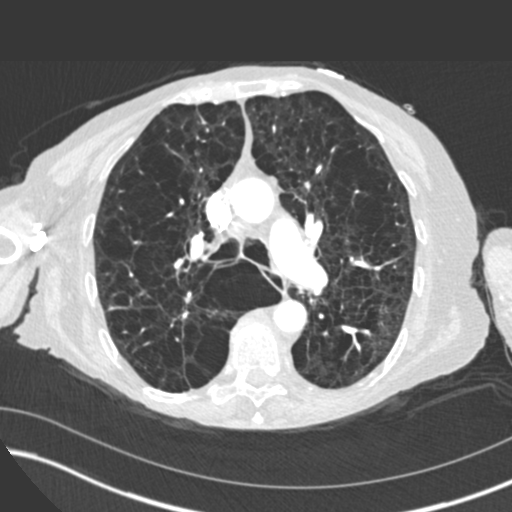
[im 202/303  mediastinal]
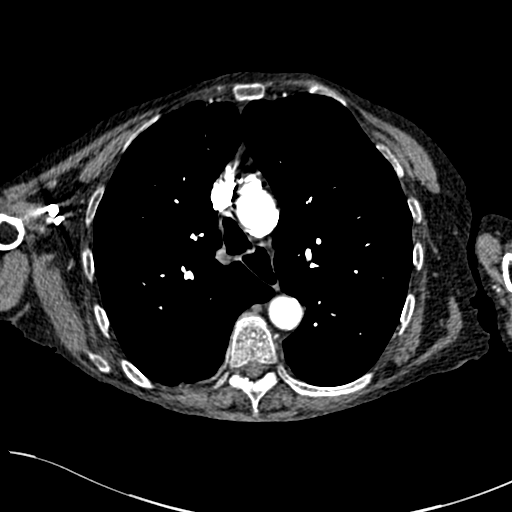
[im 219/303  lung]
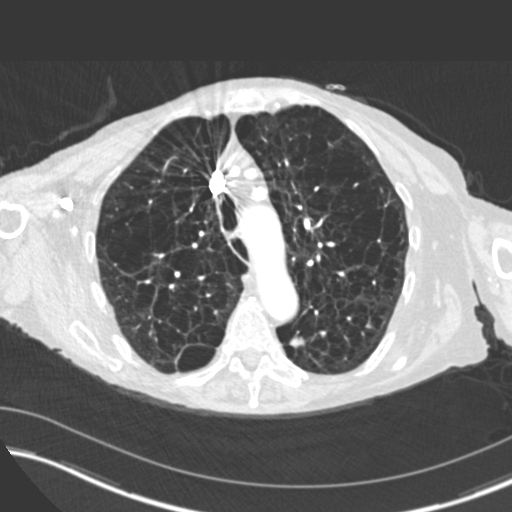
[im 235/303  mediastinal]
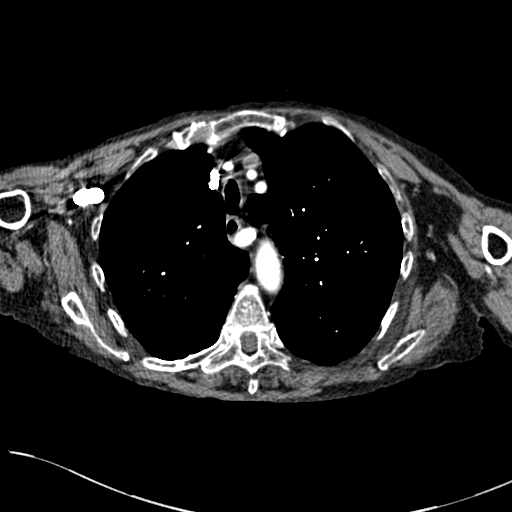
[im 252/303  lung]
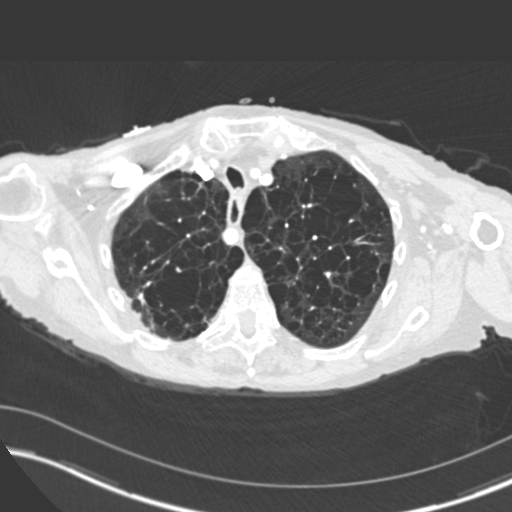
[im 269/303  mediastinal]
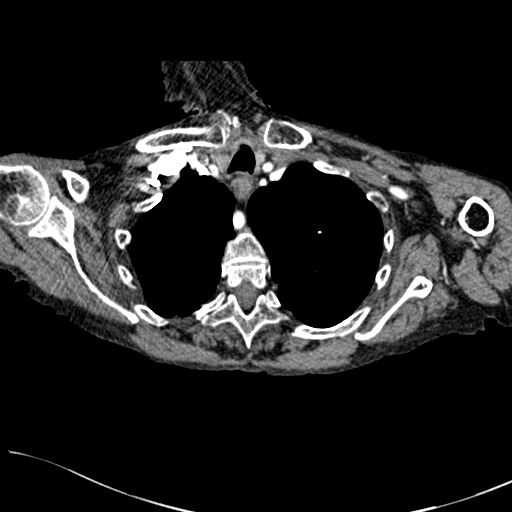
[im 286/303  lung]
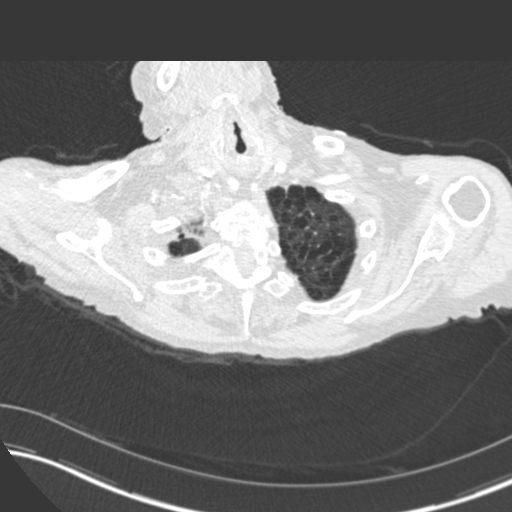

[Series 7: coronal mpr · coronal · 0.64mm/px · 1 of 137 slices shown]
[im 69/137  mediastinal]
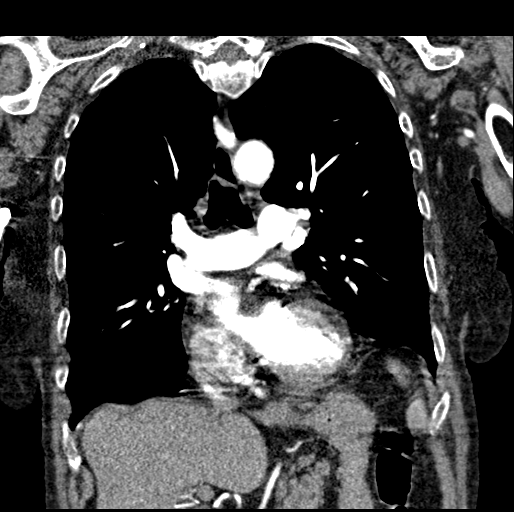

[18 of 36 positions shown; findings below may reference images not displayed]

FINDINGS: Cardiovascular: Satisfactory opacification of the pulmonary arteries
to the segmental level. No evidence of pulmonary embolism. Normal
heart size. No pericardial effusion. Diffuse atherosclerotic
calcification of the aorta and great vessels. Aberrant right
subclavian artery.

Mediastinum/Nodes: Negative for adenopathy or mass. No
pneumomediastinum.

Lungs/Pleura: Severe panlobular emphysema. 18 x 12 mm nodule in the
right upper lobe. There is a smaller more peripheral pulmonary
nodule in the right upper lobe on [DATE] measuring 8 mm. There is no
edema, consolidation, effusion, or pneumothorax.

Upper Abdomen: No acute finding

Musculoskeletal: No acute finding. Remote left-sided rib fractures.
Os acromiale on the right. Diffuse degenerative disc narrowing mild
endplate ridging. Chronic appearing T8 inferior endplate depression.

Review of the MIP images confirms the above findings.
IMPRESSION: 1. Negative for pulmonary embolism or other acute finding.
2. Two right upper lobe pulmonary nodules (18 x 12 mm and 8 mm)
which are hopefully related to right upper lobe pneumonia seen in
8589, but neoplasm is not excluded and follow-up is recommended.
Consider one of the following in 3 months: (a) repeat chest CT, (b)
follow-up PET-CT, or (c) tissue sampling. This recommendation
follows the consensus statement: Guidelines for Management of
Incidental Pulmonary Nodules Detected on CT Images: From the
3. Aortic Atherosclerosis (1B83U-C29.9) and Emphysema (1B83U-YT6.P).
